# Patient Record
Sex: Male | Born: 1965 | Race: Black or African American | Hispanic: No | Marital: Married | Smoking: Current every day smoker
Health system: Southern US, Community
[De-identification: ages and names within clinical notes are randomized; demographics above are authoritative.]

## PROBLEM LIST (undated history)

## (undated) DIAGNOSIS — N182 Chronic kidney disease, stage 2 (mild): Secondary | ICD-10-CM

## (undated) DIAGNOSIS — K852 Alcohol induced acute pancreatitis without necrosis or infection: Secondary | ICD-10-CM

## (undated) DIAGNOSIS — L02419 Cutaneous abscess of limb, unspecified: Secondary | ICD-10-CM

## (undated) DIAGNOSIS — Z89512 Acquired absence of left leg below knee: Secondary | ICD-10-CM

## (undated) DIAGNOSIS — E11621 Type 2 diabetes mellitus with foot ulcer: Secondary | ICD-10-CM

## (undated) DIAGNOSIS — Z87440 Personal history of urinary (tract) infections: Secondary | ICD-10-CM

## (undated) DIAGNOSIS — E785 Hyperlipidemia, unspecified: Secondary | ICD-10-CM

## (undated) DIAGNOSIS — F32A Depression, unspecified: Secondary | ICD-10-CM

## (undated) DIAGNOSIS — Z87898 Personal history of other specified conditions: Secondary | ICD-10-CM

## (undated) DIAGNOSIS — M869 Osteomyelitis, unspecified: Secondary | ICD-10-CM

## (undated) DIAGNOSIS — L97509 Non-pressure chronic ulcer of other part of unspecified foot with unspecified severity: Secondary | ICD-10-CM

## (undated) DIAGNOSIS — E1129 Type 2 diabetes mellitus with other diabetic kidney complication: Secondary | ICD-10-CM

## (undated) DIAGNOSIS — I739 Peripheral vascular disease, unspecified: Secondary | ICD-10-CM

## (undated) DIAGNOSIS — N151 Renal and perinephric abscess: Secondary | ICD-10-CM

## (undated) DIAGNOSIS — K2210 Ulcer of esophagus without bleeding: Secondary | ICD-10-CM

## (undated) DIAGNOSIS — F329 Major depressive disorder, single episode, unspecified: Secondary | ICD-10-CM

## (undated) DIAGNOSIS — E1165 Type 2 diabetes mellitus with hyperglycemia: Secondary | ICD-10-CM

## (undated) DIAGNOSIS — Z202 Contact with and (suspected) exposure to infections with a predominantly sexual mode of transmission: Secondary | ICD-10-CM

## (undated) DIAGNOSIS — IMO0002 Reserved for concepts with insufficient information to code with codable children: Secondary | ICD-10-CM

## (undated) DIAGNOSIS — F1911 Other psychoactive substance abuse, in remission: Secondary | ICD-10-CM

## (undated) DIAGNOSIS — Z89511 Acquired absence of right leg below knee: Secondary | ICD-10-CM

## (undated) DIAGNOSIS — B351 Tinea unguium: Secondary | ICD-10-CM

## (undated) DIAGNOSIS — E1142 Type 2 diabetes mellitus with diabetic polyneuropathy: Secondary | ICD-10-CM

## (undated) DIAGNOSIS — L03119 Cellulitis of unspecified part of limb: Secondary | ICD-10-CM

## (undated) DIAGNOSIS — L03116 Cellulitis of left lower limb: Secondary | ICD-10-CM

## (undated) DIAGNOSIS — I10 Essential (primary) hypertension: Secondary | ICD-10-CM

## (undated) DIAGNOSIS — Z89519 Acquired absence of unspecified leg below knee: Secondary | ICD-10-CM

## (undated) DIAGNOSIS — K226 Gastro-esophageal laceration-hemorrhage syndrome: Secondary | ICD-10-CM

## (undated) DIAGNOSIS — K219 Gastro-esophageal reflux disease without esophagitis: Secondary | ICD-10-CM

## (undated) DIAGNOSIS — Z87448 Personal history of other diseases of urinary system: Secondary | ICD-10-CM

## (undated) HISTORY — DX: Type 2 diabetes mellitus with diabetic polyneuropathy: E11.42

## (undated) HISTORY — DX: Hyperlipidemia, unspecified: E78.5

## (undated) HISTORY — DX: Personal history of other diseases of urinary system: Z87.448

## (undated) HISTORY — DX: Renal and perinephric abscess: N15.1

## (undated) HISTORY — DX: Cellulitis of left lower limb: L03.116

## (undated) HISTORY — DX: Type 2 diabetes mellitus with foot ulcer: E11.621

## (undated) HISTORY — DX: Contact with and (suspected) exposure to infections with a predominantly sexual mode of transmission: Z20.2

## (undated) HISTORY — DX: Osteomyelitis, unspecified: M86.9

## (undated) HISTORY — DX: Acquired absence of right leg below knee: Z89.511

## (undated) HISTORY — DX: Cellulitis of unspecified part of limb: L03.119

## (undated) HISTORY — DX: Gastro-esophageal laceration-hemorrhage syndrome: K22.6

## (undated) HISTORY — DX: Acquired absence of left leg below knee: Z89.512

## (undated) HISTORY — DX: Personal history of other specified conditions: Z87.898

## (undated) HISTORY — DX: Personal history of urinary (tract) infections: Z87.440

## (undated) HISTORY — DX: Alcohol induced acute pancreatitis without necrosis or infection: K85.20

## (undated) HISTORY — DX: Essential (primary) hypertension: I10

## (undated) HISTORY — DX: Cutaneous abscess of limb, unspecified: L02.419

## (undated) HISTORY — DX: Other psychoactive substance abuse, in remission: F19.11

## (undated) HISTORY — DX: Tinea unguium: B35.1

## (undated) HISTORY — DX: Type 2 diabetes mellitus with foot ulcer: L97.509

## (undated) HISTORY — PX: NEPHRECTOMY: SHX65

## (undated) HISTORY — DX: Ulcer of esophagus without bleeding: K22.10

---

## 1998-01-06 ENCOUNTER — Encounter: Admission: RE | Admit: 1998-01-06 | Discharge: 1998-01-06 | Payer: Self-pay | Admitting: Hematology and Oncology

## 2003-01-04 DIAGNOSIS — E1165 Type 2 diabetes mellitus with hyperglycemia: Secondary | ICD-10-CM

## 2003-01-04 DIAGNOSIS — E118 Type 2 diabetes mellitus with unspecified complications: Secondary | ICD-10-CM

## 2003-02-17 ENCOUNTER — Emergency Department (HOSPITAL_COMMUNITY): Admission: EM | Admit: 2003-02-17 | Discharge: 2003-02-17 | Payer: Self-pay | Admitting: Family Medicine

## 2003-11-10 ENCOUNTER — Encounter: Admission: RE | Admit: 2003-11-10 | Discharge: 2003-11-10 | Payer: Self-pay | Admitting: Internal Medicine

## 2003-11-18 ENCOUNTER — Encounter: Admission: RE | Admit: 2003-11-18 | Discharge: 2003-11-18 | Payer: Self-pay | Admitting: Internal Medicine

## 2003-11-25 ENCOUNTER — Encounter: Admission: RE | Admit: 2003-11-25 | Discharge: 2003-11-25 | Payer: Self-pay | Admitting: Internal Medicine

## 2003-11-25 ENCOUNTER — Ambulatory Visit (HOSPITAL_COMMUNITY): Admission: RE | Admit: 2003-11-25 | Discharge: 2003-11-25 | Payer: Self-pay | Admitting: Internal Medicine

## 2004-02-17 ENCOUNTER — Ambulatory Visit: Payer: Self-pay | Admitting: Internal Medicine

## 2004-03-04 ENCOUNTER — Emergency Department (HOSPITAL_COMMUNITY): Admission: EM | Admit: 2004-03-04 | Discharge: 2004-03-04 | Payer: Self-pay | Admitting: Emergency Medicine

## 2004-03-14 ENCOUNTER — Emergency Department (HOSPITAL_COMMUNITY): Admission: EM | Admit: 2004-03-14 | Discharge: 2004-03-14 | Payer: Self-pay | Admitting: Emergency Medicine

## 2004-03-28 ENCOUNTER — Ambulatory Visit: Payer: Self-pay | Admitting: Internal Medicine

## 2004-04-12 ENCOUNTER — Ambulatory Visit: Payer: Self-pay | Admitting: Internal Medicine

## 2004-05-03 ENCOUNTER — Encounter (HOSPITAL_BASED_OUTPATIENT_CLINIC_OR_DEPARTMENT_OTHER): Admission: RE | Admit: 2004-05-03 | Discharge: 2004-07-05 | Payer: Self-pay | Admitting: Internal Medicine

## 2004-05-21 ENCOUNTER — Emergency Department (HOSPITAL_COMMUNITY): Admission: EM | Admit: 2004-05-21 | Discharge: 2004-05-21 | Payer: Self-pay | Admitting: Emergency Medicine

## 2004-07-20 ENCOUNTER — Ambulatory Visit: Payer: Self-pay | Admitting: Internal Medicine

## 2004-08-03 ENCOUNTER — Ambulatory Visit: Payer: Self-pay | Admitting: Internal Medicine

## 2005-04-10 DIAGNOSIS — K2210 Ulcer of esophagus without bleeding: Secondary | ICD-10-CM

## 2005-04-10 HISTORY — DX: Ulcer of esophagus without bleeding: K22.10

## 2005-10-12 ENCOUNTER — Inpatient Hospital Stay (HOSPITAL_COMMUNITY): Admission: EM | Admit: 2005-10-12 | Discharge: 2005-10-14 | Payer: Self-pay | Admitting: Emergency Medicine

## 2005-10-12 ENCOUNTER — Ambulatory Visit: Payer: Self-pay | Admitting: Internal Medicine

## 2005-10-12 ENCOUNTER — Encounter (INDEPENDENT_AMBULATORY_CARE_PROVIDER_SITE_OTHER): Payer: Self-pay | Admitting: Specialist

## 2005-10-17 ENCOUNTER — Ambulatory Visit: Payer: Self-pay | Admitting: Gastroenterology

## 2005-10-20 ENCOUNTER — Ambulatory Visit: Payer: Self-pay | Admitting: Internal Medicine

## 2005-10-31 ENCOUNTER — Inpatient Hospital Stay (HOSPITAL_COMMUNITY): Admission: AD | Admit: 2005-10-31 | Discharge: 2005-11-04 | Payer: Self-pay | Admitting: Internal Medicine

## 2005-10-31 ENCOUNTER — Ambulatory Visit: Payer: Self-pay | Admitting: Internal Medicine

## 2005-11-15 ENCOUNTER — Ambulatory Visit: Payer: Self-pay | Admitting: Internal Medicine

## 2006-01-03 DIAGNOSIS — F1910 Other psychoactive substance abuse, uncomplicated: Secondary | ICD-10-CM | POA: Insufficient documentation

## 2006-01-03 DIAGNOSIS — F17200 Nicotine dependence, unspecified, uncomplicated: Secondary | ICD-10-CM

## 2006-01-03 DIAGNOSIS — B354 Tinea corporis: Secondary | ICD-10-CM | POA: Insufficient documentation

## 2006-08-17 ENCOUNTER — Emergency Department (HOSPITAL_COMMUNITY): Admission: EM | Admit: 2006-08-17 | Discharge: 2006-08-17 | Payer: Self-pay | Admitting: Emergency Medicine

## 2006-11-22 ENCOUNTER — Emergency Department (HOSPITAL_COMMUNITY): Admission: EM | Admit: 2006-11-22 | Discharge: 2006-11-22 | Payer: Self-pay | Admitting: Emergency Medicine

## 2006-11-23 ENCOUNTER — Other Ambulatory Visit: Payer: Self-pay | Admitting: Emergency Medicine

## 2006-11-24 ENCOUNTER — Inpatient Hospital Stay (HOSPITAL_COMMUNITY): Admission: EM | Admit: 2006-11-24 | Discharge: 2006-11-29 | Payer: Self-pay | Admitting: Internal Medicine

## 2006-11-24 ENCOUNTER — Ambulatory Visit: Payer: Self-pay | Admitting: Internal Medicine

## 2006-11-26 ENCOUNTER — Encounter: Payer: Self-pay | Admitting: Internal Medicine

## 2006-11-29 ENCOUNTER — Encounter: Payer: Self-pay | Admitting: Internal Medicine

## 2006-11-29 ENCOUNTER — Telehealth: Payer: Self-pay | Admitting: *Deleted

## 2006-12-05 ENCOUNTER — Telehealth: Payer: Self-pay | Admitting: *Deleted

## 2006-12-05 ENCOUNTER — Ambulatory Visit: Payer: Self-pay | Admitting: Internal Medicine

## 2006-12-27 ENCOUNTER — Telehealth (INDEPENDENT_AMBULATORY_CARE_PROVIDER_SITE_OTHER): Payer: Self-pay | Admitting: Internal Medicine

## 2007-01-14 ENCOUNTER — Telehealth: Payer: Self-pay | Admitting: *Deleted

## 2007-02-26 ENCOUNTER — Emergency Department (HOSPITAL_COMMUNITY): Admission: EM | Admit: 2007-02-26 | Discharge: 2007-02-26 | Payer: Self-pay | Admitting: *Deleted

## 2007-02-27 ENCOUNTER — Ambulatory Visit: Payer: Self-pay | Admitting: Infectious Disease

## 2007-02-27 ENCOUNTER — Inpatient Hospital Stay (HOSPITAL_COMMUNITY): Admission: EM | Admit: 2007-02-27 | Discharge: 2007-03-04 | Payer: Self-pay | Admitting: Emergency Medicine

## 2007-07-10 ENCOUNTER — Inpatient Hospital Stay (HOSPITAL_COMMUNITY): Admission: EM | Admit: 2007-07-10 | Discharge: 2007-07-12 | Payer: Self-pay | Admitting: Internal Medicine

## 2007-07-10 ENCOUNTER — Ambulatory Visit: Payer: Self-pay | Admitting: *Deleted

## 2007-07-10 DIAGNOSIS — K226 Gastro-esophageal laceration-hemorrhage syndrome: Secondary | ICD-10-CM

## 2007-07-10 HISTORY — DX: Gastro-esophageal laceration-hemorrhage syndrome: K22.6

## 2007-08-23 ENCOUNTER — Telehealth (INDEPENDENT_AMBULATORY_CARE_PROVIDER_SITE_OTHER): Payer: Self-pay | Admitting: *Deleted

## 2007-09-04 ENCOUNTER — Telehealth (INDEPENDENT_AMBULATORY_CARE_PROVIDER_SITE_OTHER): Payer: Self-pay | Admitting: *Deleted

## 2007-09-05 ENCOUNTER — Telehealth (INDEPENDENT_AMBULATORY_CARE_PROVIDER_SITE_OTHER): Payer: Self-pay | Admitting: Pharmacy Technician

## 2007-09-09 ENCOUNTER — Emergency Department (HOSPITAL_COMMUNITY): Admission: EM | Admit: 2007-09-09 | Discharge: 2007-09-09 | Payer: Self-pay | Admitting: Emergency Medicine

## 2007-10-28 ENCOUNTER — Emergency Department (HOSPITAL_COMMUNITY): Admission: EM | Admit: 2007-10-28 | Discharge: 2007-10-29 | Payer: Self-pay | Admitting: Emergency Medicine

## 2007-11-18 ENCOUNTER — Telehealth: Payer: Self-pay | Admitting: *Deleted

## 2007-11-25 ENCOUNTER — Ambulatory Visit: Payer: Self-pay | Admitting: *Deleted

## 2007-11-25 LAB — CONVERTED CEMR LAB: Blood Glucose, Fingerstick: 155

## 2007-11-27 ENCOUNTER — Ambulatory Visit: Payer: Self-pay | Admitting: Internal Medicine

## 2007-11-27 ENCOUNTER — Encounter: Payer: Self-pay | Admitting: Internal Medicine

## 2007-12-09 LAB — CONVERTED CEMR LAB
Albumin: 4 g/dL (ref 3.5–5.2)
Alkaline Phosphatase: 81 units/L (ref 39–117)
BUN: 15 mg/dL (ref 6–23)
Glucose, Bld: 64 mg/dL — ABNORMAL LOW (ref 70–99)
HDL: 88 mg/dL (ref >39)
LDL Cholesterol: 91 mg/dL (ref 0–99)
Microalb, Ur: 2.2 mg/dL — ABNORMAL HIGH (ref 0.00–1.89)
Potassium: 4.1 meq/L (ref 3.5–5.3)
Total Bilirubin: 0.3 mg/dL (ref 0.3–1.2)
Triglycerides: 60 mg/dL (ref <150)

## 2008-01-14 ENCOUNTER — Inpatient Hospital Stay (HOSPITAL_COMMUNITY): Admission: EM | Admit: 2008-01-14 | Discharge: 2008-01-16 | Payer: Self-pay | Admitting: Emergency Medicine

## 2008-01-14 ENCOUNTER — Ambulatory Visit: Payer: Self-pay | Admitting: *Deleted

## 2008-02-04 ENCOUNTER — Encounter (INDEPENDENT_AMBULATORY_CARE_PROVIDER_SITE_OTHER): Payer: Self-pay | Admitting: Internal Medicine

## 2008-03-10 DIAGNOSIS — L03116 Cellulitis of left lower limb: Secondary | ICD-10-CM

## 2008-03-10 HISTORY — DX: Cellulitis of left lower limb: L03.116

## 2008-03-19 ENCOUNTER — Telehealth: Payer: Self-pay | Admitting: *Deleted

## 2008-03-24 ENCOUNTER — Ambulatory Visit: Payer: Self-pay | Admitting: Internal Medicine

## 2008-03-24 ENCOUNTER — Inpatient Hospital Stay (HOSPITAL_COMMUNITY): Admission: AD | Admit: 2008-03-24 | Discharge: 2008-03-30 | Payer: Self-pay | Admitting: Internal Medicine

## 2008-03-24 DIAGNOSIS — E1169 Type 2 diabetes mellitus with other specified complication: Secondary | ICD-10-CM

## 2008-03-26 ENCOUNTER — Ambulatory Visit: Payer: Self-pay | Admitting: Infectious Diseases

## 2008-03-30 ENCOUNTER — Encounter: Payer: Self-pay | Admitting: Internal Medicine

## 2008-04-23 ENCOUNTER — Telehealth: Payer: Self-pay | Admitting: Internal Medicine

## 2008-04-23 ENCOUNTER — Telehealth (INDEPENDENT_AMBULATORY_CARE_PROVIDER_SITE_OTHER): Payer: Self-pay | Admitting: *Deleted

## 2008-05-05 ENCOUNTER — Encounter: Payer: Self-pay | Admitting: Internal Medicine

## 2008-06-10 ENCOUNTER — Telehealth: Payer: Self-pay | Admitting: *Deleted

## 2008-06-16 ENCOUNTER — Telehealth: Payer: Self-pay | Admitting: Internal Medicine

## 2008-07-21 ENCOUNTER — Ambulatory Visit: Payer: Self-pay | Admitting: Internal Medicine

## 2008-07-21 ENCOUNTER — Encounter: Payer: Self-pay | Admitting: Internal Medicine

## 2008-07-21 DIAGNOSIS — E1140 Type 2 diabetes mellitus with diabetic neuropathy, unspecified: Secondary | ICD-10-CM

## 2008-07-21 DIAGNOSIS — N182 Chronic kidney disease, stage 2 (mild): Secondary | ICD-10-CM

## 2008-07-21 DIAGNOSIS — R82998 Other abnormal findings in urine: Secondary | ICD-10-CM

## 2008-07-21 LAB — CONVERTED CEMR LAB
Blood Glucose, Fingerstick: 195
Blood in Urine, dipstick: NEGATIVE
CO2: 27 meq/L (ref 19–32)
Chloride: 104 meq/L (ref 96–112)
Glucose, Bld: 196 mg/dL — ABNORMAL HIGH (ref 70–99)
Glucose, Urine, Semiquant: 500
Ketones, urine, test strip: NEGATIVE
Nitrite: NEGATIVE
Potassium: 4.1 meq/L (ref 3.5–5.3)
Sodium: 137 meq/L (ref 135–145)

## 2008-08-07 ENCOUNTER — Ambulatory Visit: Payer: Self-pay | Admitting: *Deleted

## 2008-08-07 ENCOUNTER — Inpatient Hospital Stay (HOSPITAL_COMMUNITY): Admission: EM | Admit: 2008-08-07 | Discharge: 2008-08-09 | Payer: Self-pay | Admitting: Emergency Medicine

## 2008-08-08 DIAGNOSIS — K852 Alcohol induced acute pancreatitis without necrosis or infection: Secondary | ICD-10-CM

## 2008-08-08 HISTORY — DX: Alcohol induced acute pancreatitis without necrosis or infection: K85.20

## 2008-08-09 ENCOUNTER — Encounter (INDEPENDENT_AMBULATORY_CARE_PROVIDER_SITE_OTHER): Payer: Self-pay | Admitting: Internal Medicine

## 2008-09-01 ENCOUNTER — Telehealth: Payer: Self-pay | Admitting: Internal Medicine

## 2008-09-08 DIAGNOSIS — N151 Renal and perinephric abscess: Secondary | ICD-10-CM

## 2008-09-08 HISTORY — DX: Renal and perinephric abscess: N15.1

## 2008-09-10 ENCOUNTER — Ambulatory Visit: Payer: Self-pay | Admitting: Infectious Diseases

## 2008-09-10 ENCOUNTER — Inpatient Hospital Stay (HOSPITAL_COMMUNITY): Admission: EM | Admit: 2008-09-10 | Discharge: 2008-09-16 | Payer: Self-pay | Admitting: Emergency Medicine

## 2008-09-16 ENCOUNTER — Encounter (INDEPENDENT_AMBULATORY_CARE_PROVIDER_SITE_OTHER): Payer: Self-pay | Admitting: *Deleted

## 2008-09-16 DIAGNOSIS — N151 Renal and perinephric abscess: Secondary | ICD-10-CM | POA: Insufficient documentation

## 2008-09-22 ENCOUNTER — Ambulatory Visit: Payer: Self-pay | Admitting: Internal Medicine

## 2008-09-22 ENCOUNTER — Encounter (INDEPENDENT_AMBULATORY_CARE_PROVIDER_SITE_OTHER): Payer: Self-pay | Admitting: *Deleted

## 2008-09-22 DIAGNOSIS — I10 Essential (primary) hypertension: Secondary | ICD-10-CM | POA: Insufficient documentation

## 2008-09-22 DIAGNOSIS — R000 Tachycardia, unspecified: Secondary | ICD-10-CM

## 2008-09-22 DIAGNOSIS — B351 Tinea unguium: Secondary | ICD-10-CM | POA: Insufficient documentation

## 2008-09-22 LAB — CONVERTED CEMR LAB
Amphetamine Screen, Ur: NEGATIVE
Benzodiazepines.: NEGATIVE
Cocaine Metabolites: NEGATIVE
Creatinine,U: 182.8 mg/dL
Leukocytes, UA: NEGATIVE
Nitrite: NEGATIVE
Specific Gravity, Urine: 1.027 (ref 1.005–1.030)
Urine Glucose: 500 mg/dL — AB
pH: 6 (ref 5.0–8.0)

## 2008-09-23 ENCOUNTER — Encounter (INDEPENDENT_AMBULATORY_CARE_PROVIDER_SITE_OTHER): Payer: Self-pay | Admitting: *Deleted

## 2008-09-24 ENCOUNTER — Telehealth: Payer: Self-pay | Admitting: Internal Medicine

## 2008-09-28 ENCOUNTER — Encounter: Payer: Self-pay | Admitting: Internal Medicine

## 2008-10-13 ENCOUNTER — Encounter: Payer: Self-pay | Admitting: Internal Medicine

## 2008-10-16 ENCOUNTER — Telehealth: Payer: Self-pay | Admitting: Infectious Diseases

## 2008-10-22 ENCOUNTER — Encounter (INDEPENDENT_AMBULATORY_CARE_PROVIDER_SITE_OTHER): Payer: Self-pay | Admitting: Urology

## 2008-10-22 ENCOUNTER — Inpatient Hospital Stay (HOSPITAL_COMMUNITY): Admission: RE | Admit: 2008-10-22 | Discharge: 2008-10-26 | Payer: Self-pay | Admitting: Urology

## 2008-10-28 ENCOUNTER — Encounter: Payer: Self-pay | Admitting: Internal Medicine

## 2008-10-28 ENCOUNTER — Ambulatory Visit: Payer: Self-pay | Admitting: Internal Medicine

## 2008-10-28 LAB — CONVERTED CEMR LAB
ALT: 10 units/L (ref 0–53)
AST: 11 units/L (ref 0–37)
Albumin: 3.9 g/dL (ref 3.5–5.2)
Alkaline Phosphatase: 100 units/L (ref 39–117)
Blood Glucose, Fingerstick: 189
Calcium: 9.3 mg/dL (ref 8.4–10.5)
Chloride: 101 meq/L (ref 96–112)
Creatinine, Ser: 1.35 mg/dL (ref 0.40–1.50)
Hgb A1c MFr Bld: 8.8 %
LDL Cholesterol: 94 mg/dL (ref 0–99)
Potassium: 4.3 meq/L (ref 3.5–5.3)
Total CHOL/HDL Ratio: 2.7
Triglycerides: 74 mg/dL (ref <150)
VLDL: 15 mg/dL (ref 0–40)

## 2008-12-02 ENCOUNTER — Telehealth (INDEPENDENT_AMBULATORY_CARE_PROVIDER_SITE_OTHER): Payer: Self-pay | Admitting: Internal Medicine

## 2009-02-05 ENCOUNTER — Telehealth: Payer: Self-pay | Admitting: Internal Medicine

## 2009-10-05 IMAGING — US US ABDOMEN COMPLETE
1 series · 14 of 25 positions shown · non-contrast
Comparison: CT scan dated 10/29/2007

CLINICAL DATA: Abdominal pain with nausea and vomiting.

COMPLETE ABDOMINAL ULTRASOUND

[Series 1: us abdomen complete · 0.32mm/px · 14 of 76 slices shown]
[im 1/76]
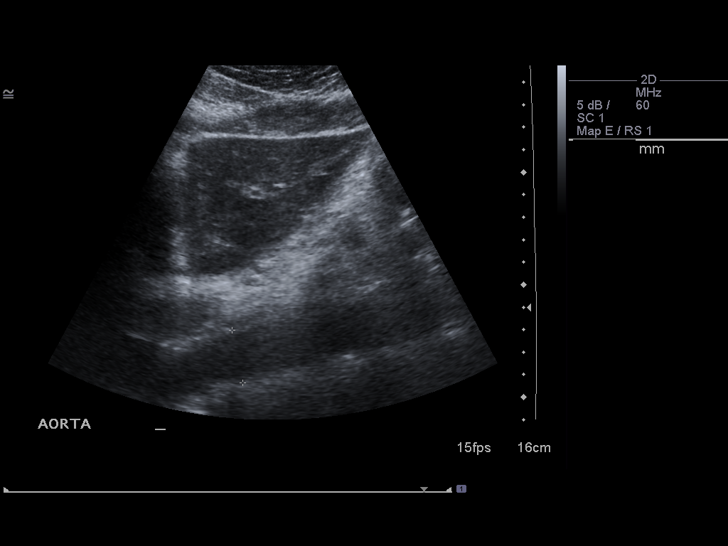
[im 7/76]
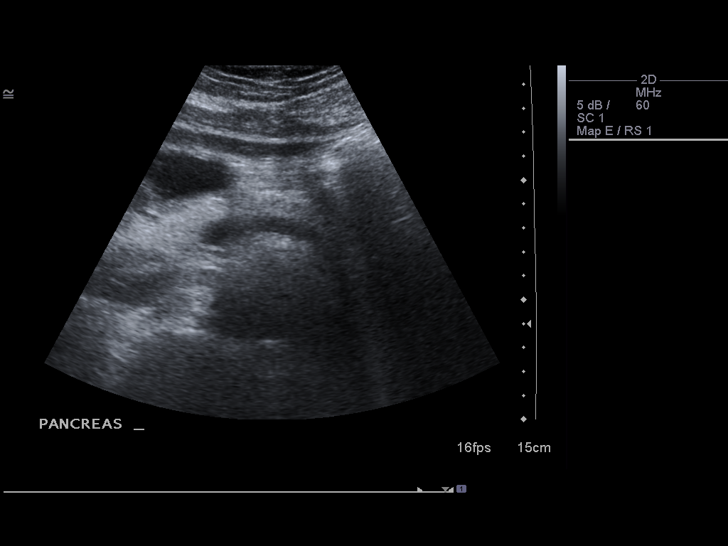
[im 13/76]
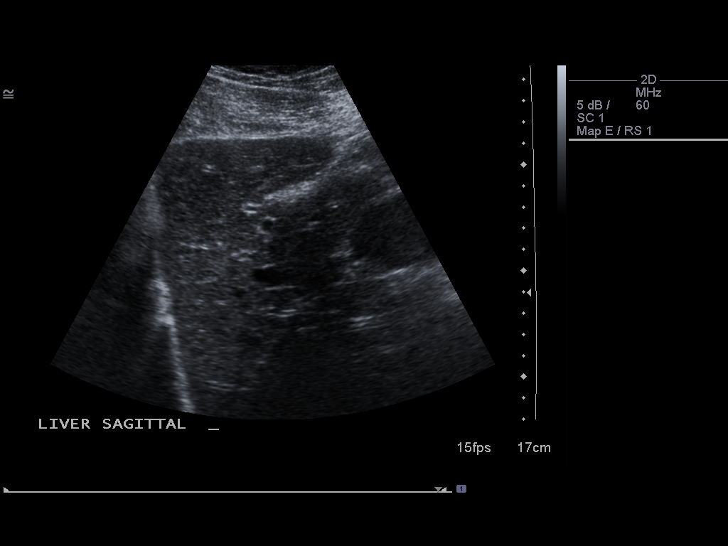
[im 19/76]
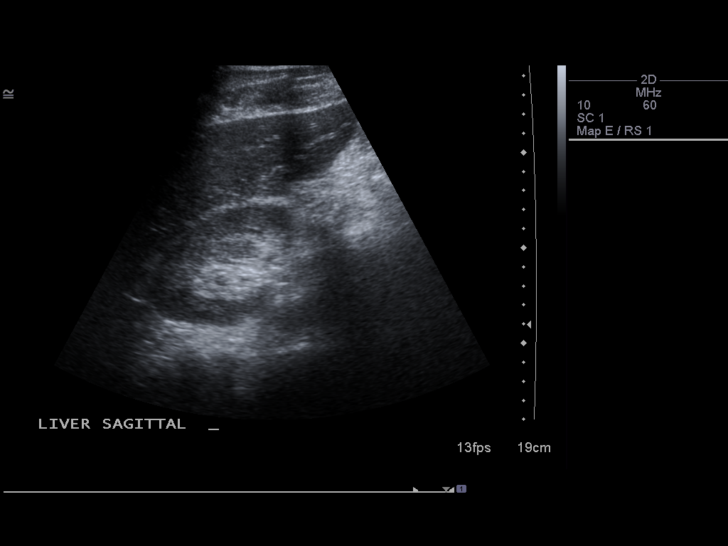
[im 26/76]
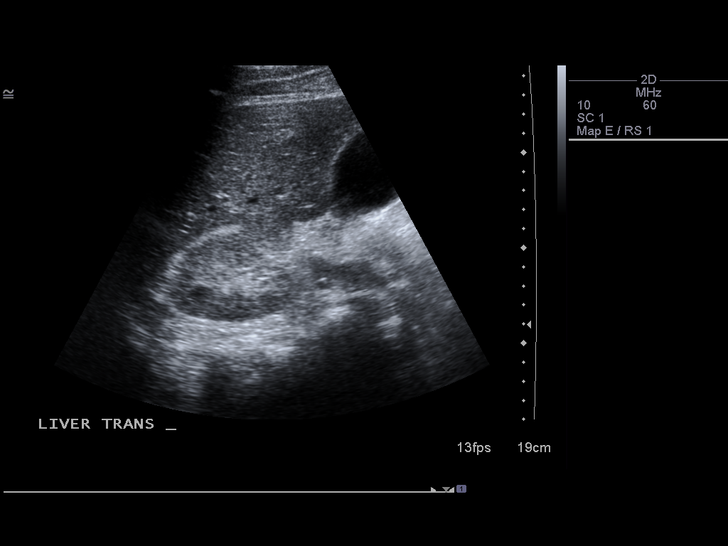
[im 29/76]
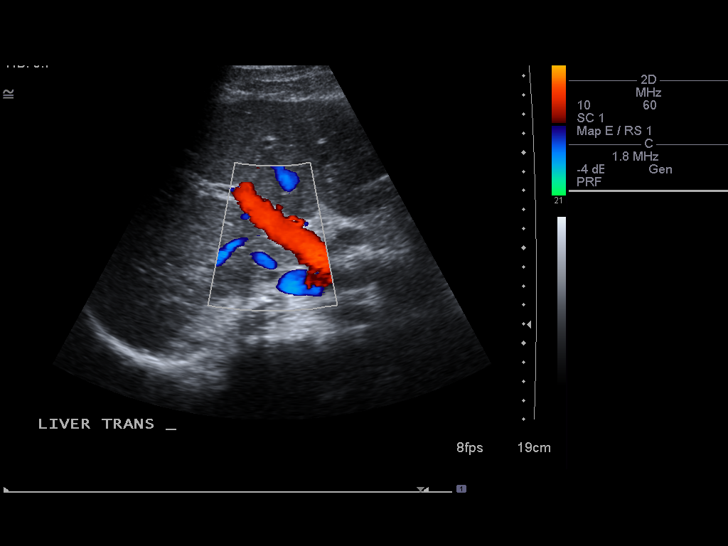
[im 35/76]
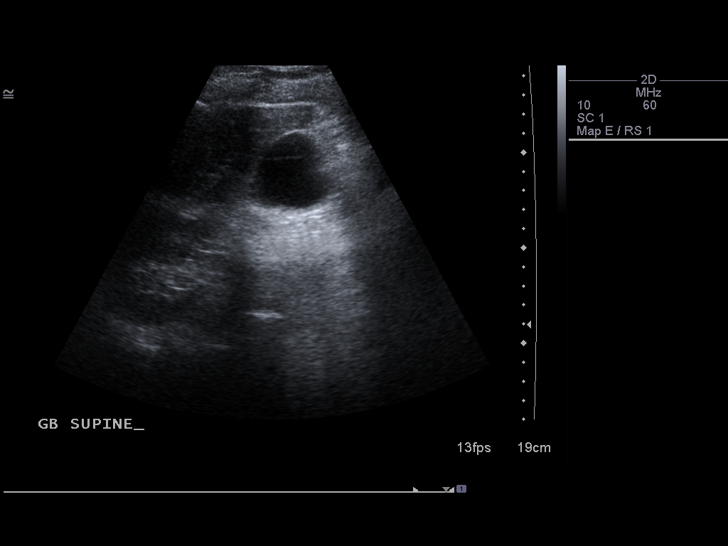
[im 41/76]
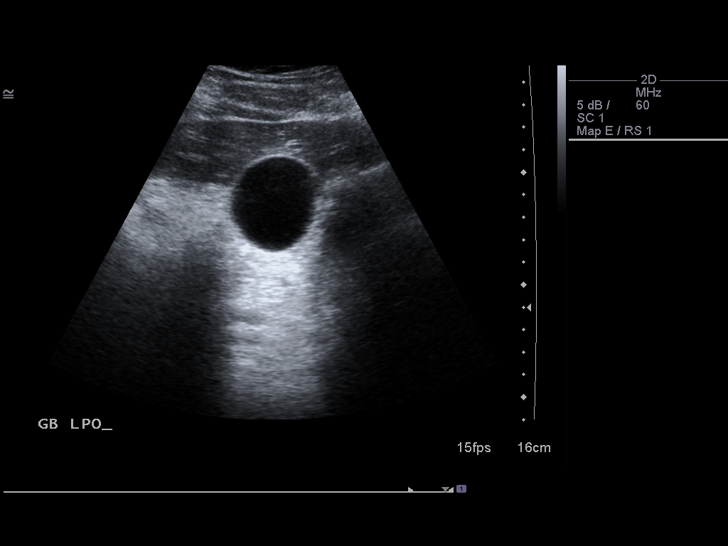
[im 47/76]
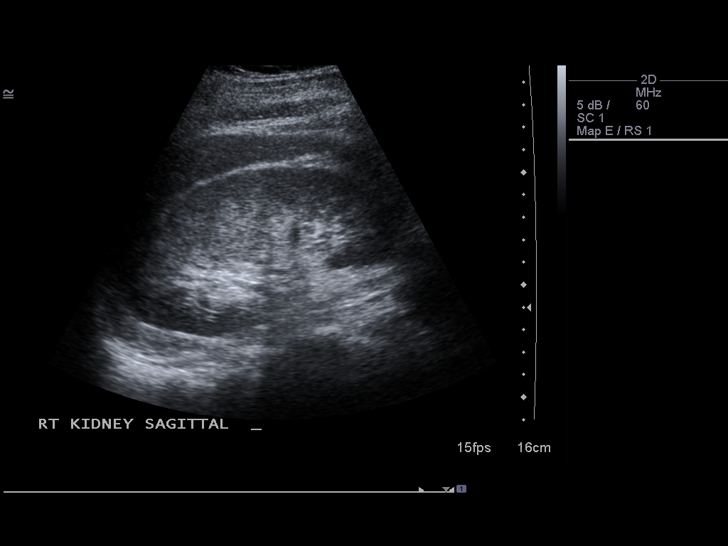
[im 51/76]
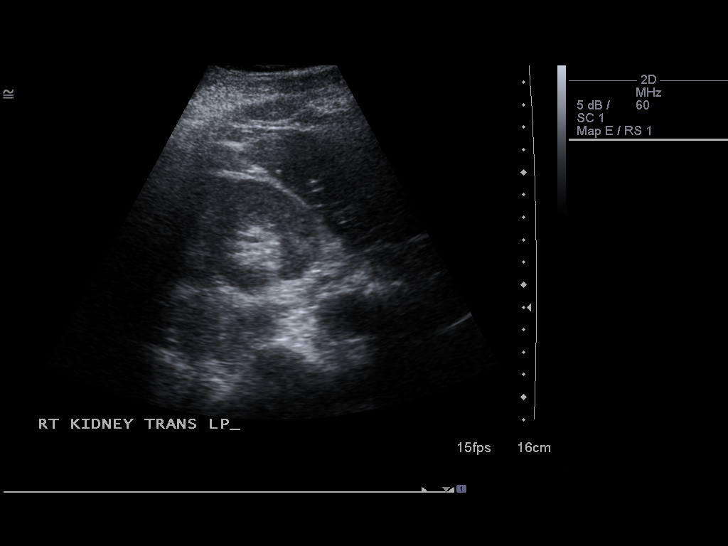
[im 57/76]
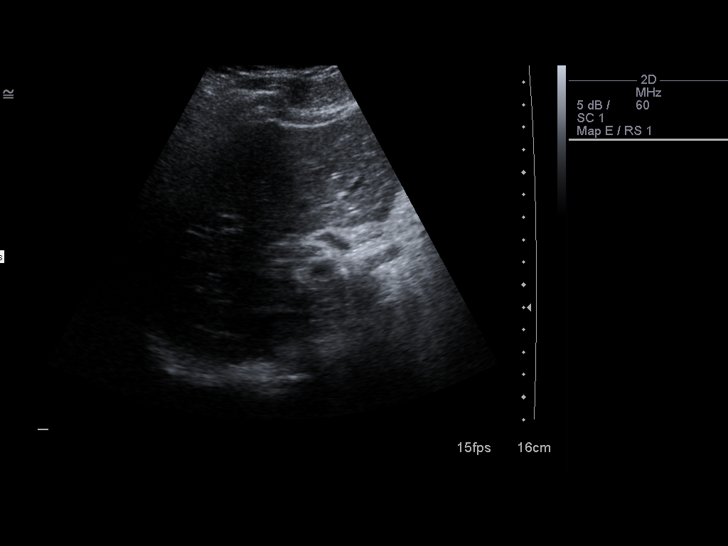
[im 63/76]
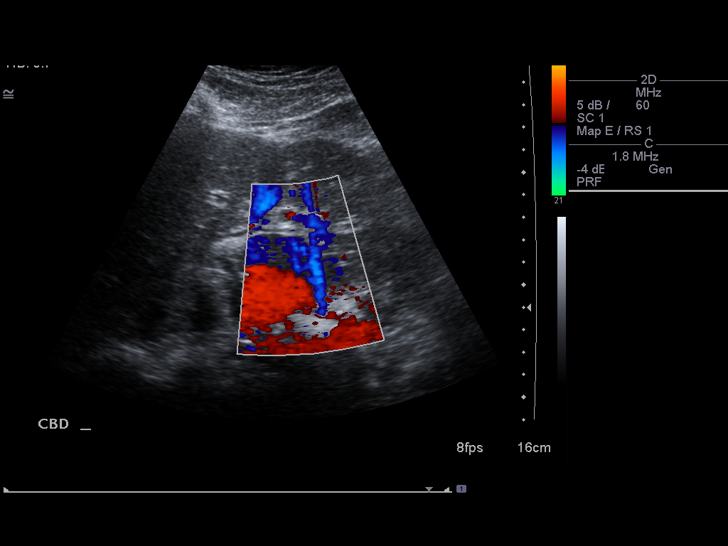
[im 69/76]
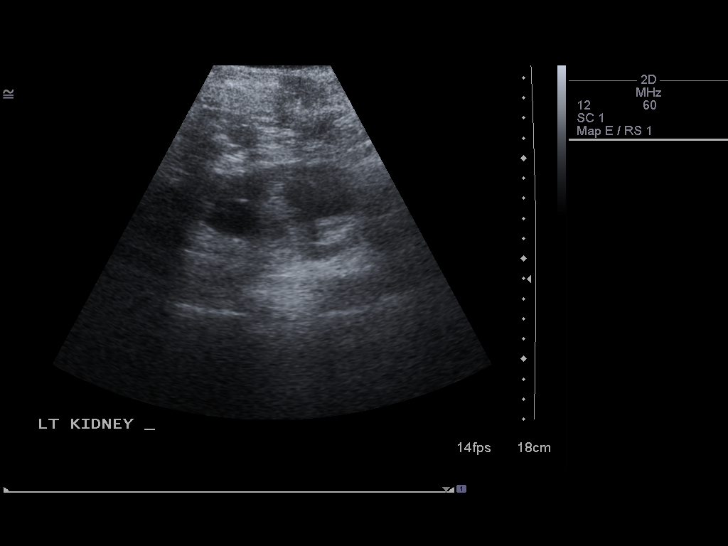
[im 76/76]
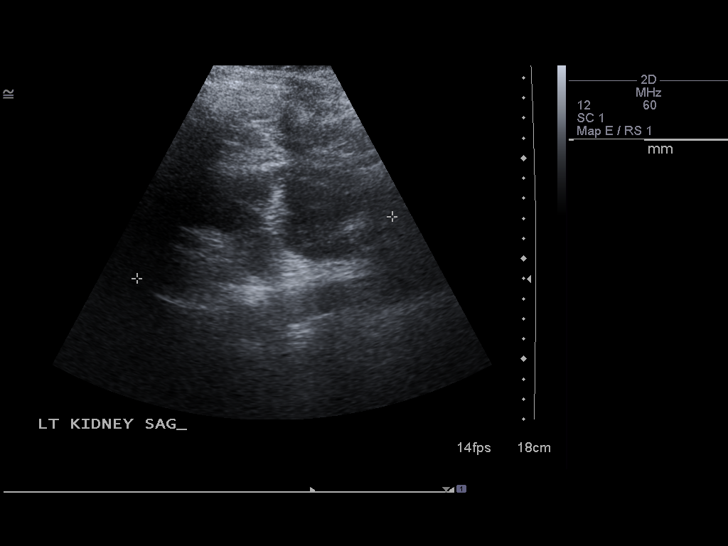

[14 of 25 positions shown; findings below may reference images not displayed]

FINDINGS: Gallbladder:  Normal.  However, the patient did have a positive
sonographic Murphy's sign.

Common bile duct:  Normal at 5.8 mm.

Liver:  There is intrahepatic ductal dilatation without a discrete
site of obstruction.  There is no discrete mass.

IVC:  Normal.

Pancreas:  Normal.

Spleen:  Normal.  6.5 cm in length.

Right Kidney:  Normal.  13.1 cm in length.

Left Kidney:  There is chronic  severe hydronephrosis, unchanged
since 10/29/2007

Abdominal aorta:  Normal.  2.4 cm maximal diameter.

Other Findings:  None
IMPRESSION: The patient has mild nonspecific intrahepatic biliary ductal
dilatation of unknown etiology.

Chronic left hydronephrosis, unchanged.

## 2009-12-22 IMAGING — CR DG CHEST 2V
2 series · 2 of 2 positions shown · non-contrast
Comparison: CT abdomen 09/15/2008.  11/24/2006 and earlier.

CLINICAL DATA: 43-year-old male with pyelonephritis and shortness
of breath.

CHEST - 2 VIEW

[w chest pa]
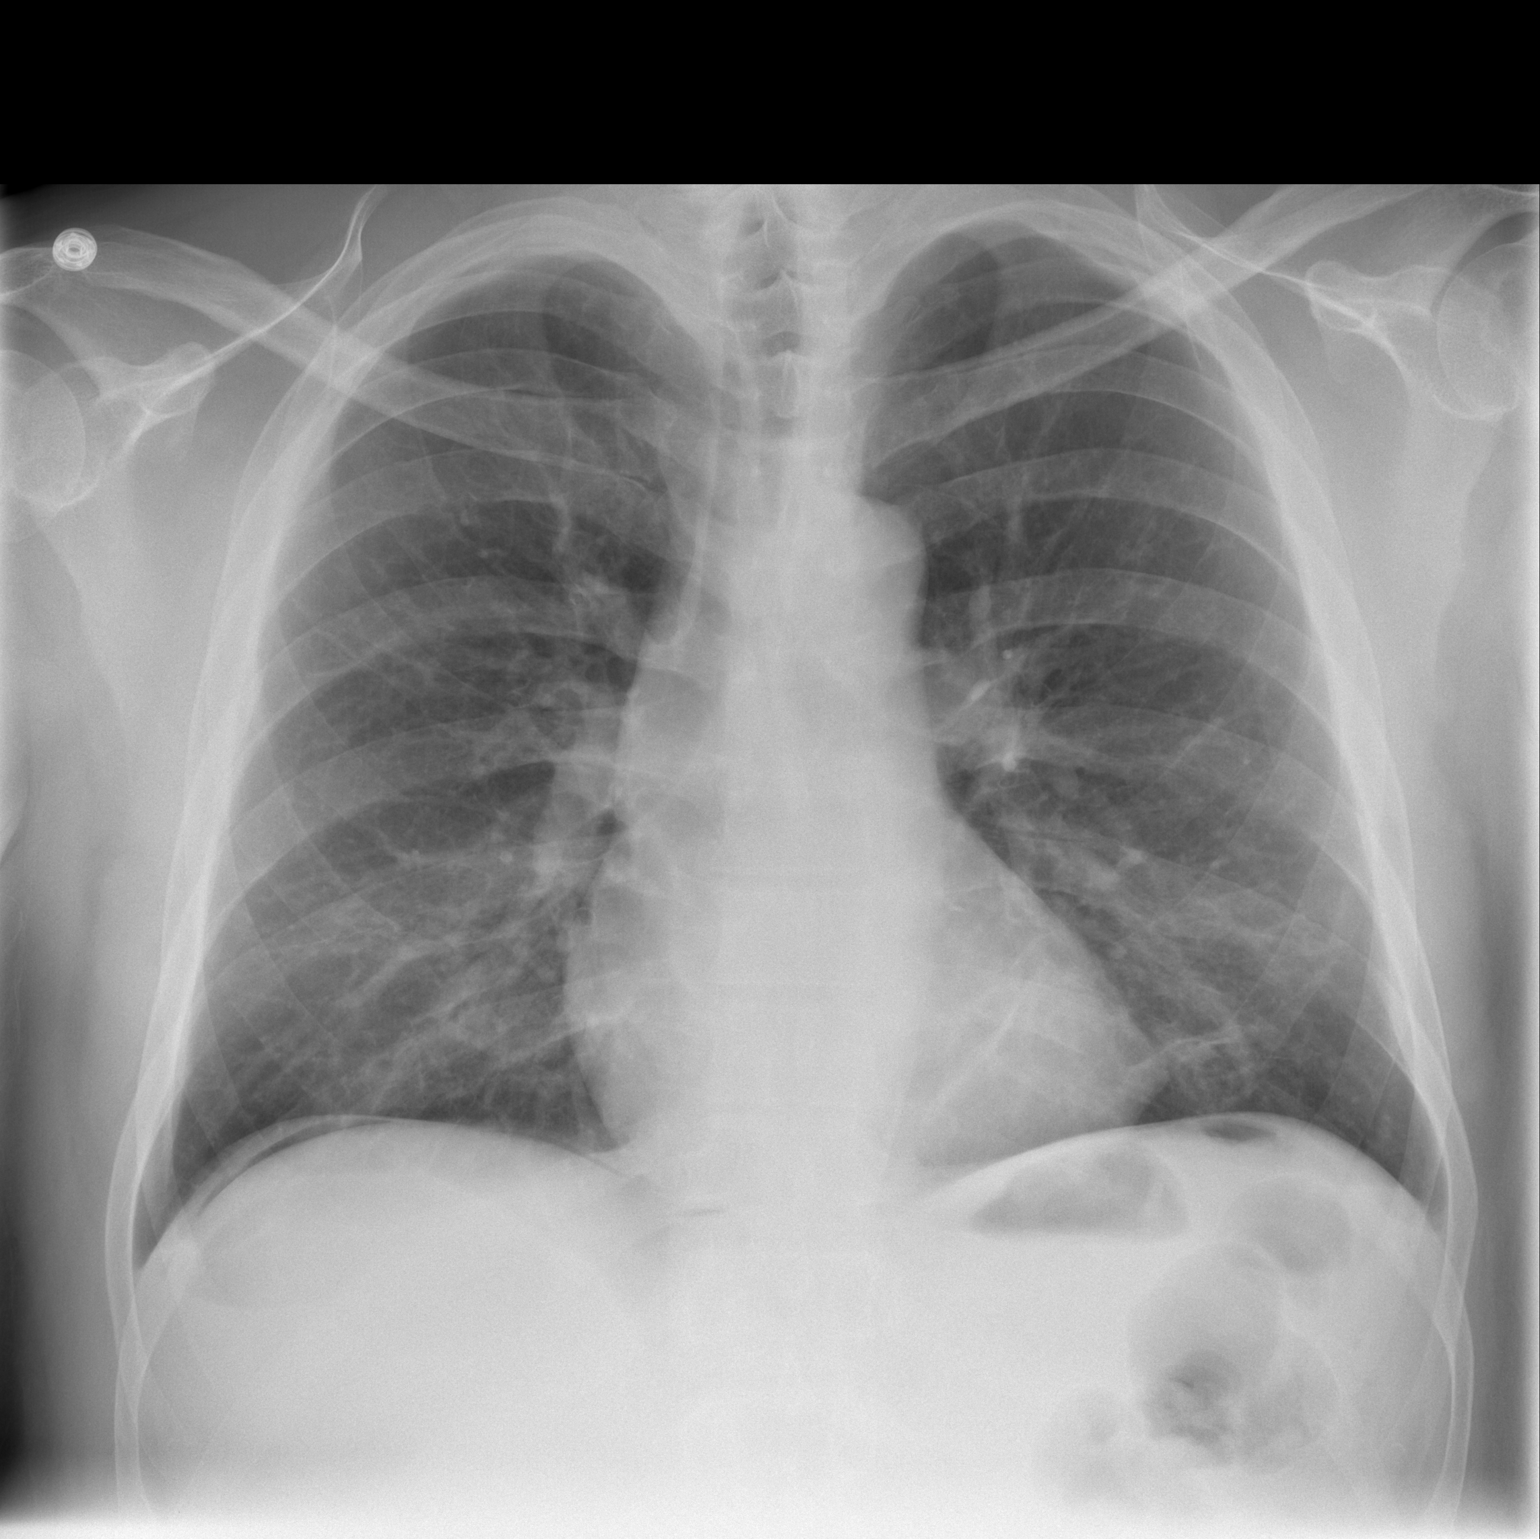

[w chest lat]
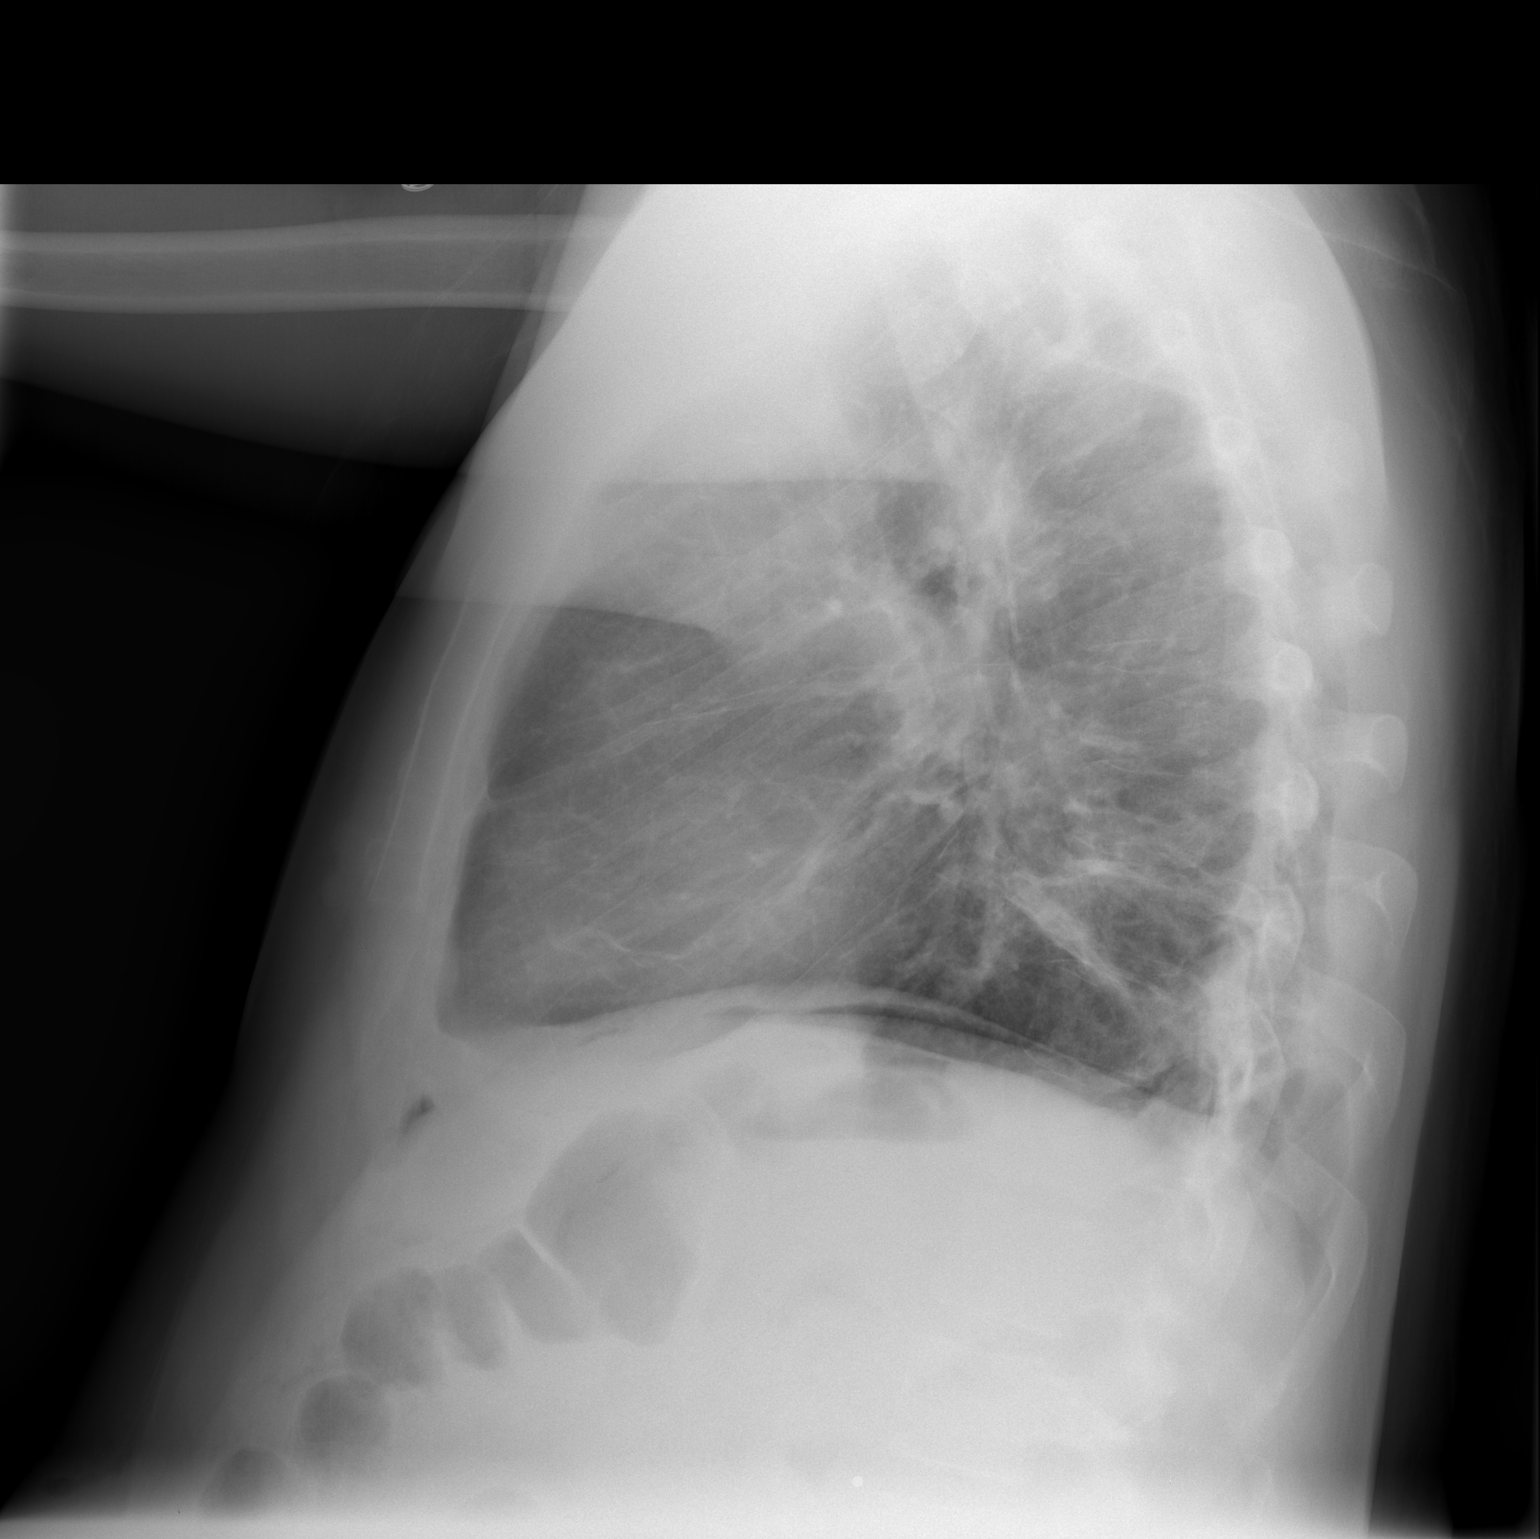

[2 of 2 positions shown; findings below may reference images not displayed]

FINDINGS: Pneumoperitoneum, not evident on the comparison CT of the
abdomen from last month.  No pneumothorax.  No pneumomediastinum
identified.  Lungs remain clear.  Stable cardiac size and
mediastinal contours.  Stable visualized osseous structures.  There
is a small punctate metallic or radiopaque foreign body projecting
in the upper abdomen on the lateral view of uncertain significance.
IMPRESSION: 1.  New pneumoperitoneum.  By report, this patient had a recent
laparoscopic renal procedure , but that would presumably be limited
to the retroperitoneal space.  Correlate for possible ruptured
bowel.
2. No acute cardiopulmonary abnormality.

Critical test results telephoned to Michael Ivan Beatingo RN at the time

## 2010-05-19 ENCOUNTER — Other Ambulatory Visit: Payer: Self-pay | Admitting: Internal Medicine

## 2010-05-19 ENCOUNTER — Inpatient Hospital Stay (HOSPITAL_COMMUNITY)
Admission: AD | Admit: 2010-05-19 | Discharge: 2010-05-23 | DRG: 617 | Disposition: A | Discharge status: Home / Self Care | Payer: Medicaid Other | Source: Ambulatory Visit | Attending: Internal Medicine | Admitting: Internal Medicine

## 2010-05-19 ENCOUNTER — Encounter: Payer: Self-pay | Admitting: Internal Medicine

## 2010-05-19 ENCOUNTER — Inpatient Hospital Stay (HOSPITAL_COMMUNITY): Payer: Medicaid Other

## 2010-05-19 ENCOUNTER — Ambulatory Visit (INDEPENDENT_AMBULATORY_CARE_PROVIDER_SITE_OTHER): Payer: Self-pay | Admitting: Internal Medicine

## 2010-05-19 DIAGNOSIS — I10 Essential (primary) hypertension: Secondary | ICD-10-CM | POA: Diagnosis present

## 2010-05-19 DIAGNOSIS — E119 Type 2 diabetes mellitus without complications: Secondary | ICD-10-CM

## 2010-05-19 DIAGNOSIS — L97509 Non-pressure chronic ulcer of other part of unspecified foot with unspecified severity: Secondary | ICD-10-CM

## 2010-05-19 DIAGNOSIS — I2789 Other specified pulmonary heart diseases: Secondary | ICD-10-CM | POA: Diagnosis present

## 2010-05-19 DIAGNOSIS — E1169 Type 2 diabetes mellitus with other specified complication: Secondary | ICD-10-CM

## 2010-05-19 DIAGNOSIS — M9080 Osteopathy in diseases classified elsewhere, unspecified site: Secondary | ICD-10-CM | POA: Diagnosis present

## 2010-05-19 DIAGNOSIS — M869 Osteomyelitis, unspecified: Secondary | ICD-10-CM | POA: Diagnosis present

## 2010-05-19 DIAGNOSIS — E785 Hyperlipidemia, unspecified: Secondary | ICD-10-CM | POA: Diagnosis present

## 2010-05-19 DIAGNOSIS — Z794 Long term (current) use of insulin: Secondary | ICD-10-CM

## 2010-05-19 DIAGNOSIS — D638 Anemia in other chronic diseases classified elsewhere: Secondary | ICD-10-CM | POA: Diagnosis present

## 2010-05-19 DIAGNOSIS — E11621 Type 2 diabetes mellitus with foot ulcer: Secondary | ICD-10-CM

## 2010-05-19 DIAGNOSIS — K219 Gastro-esophageal reflux disease without esophagitis: Secondary | ICD-10-CM | POA: Diagnosis present

## 2010-05-19 LAB — COMPREHENSIVE METABOLIC PANEL
ALT: 11 U/L (ref 0–53)
AST: 17 U/L (ref 0–37)
CO2: 25 mEq/L (ref 19–32)
Chloride: 102 mEq/L (ref 96–112)
Creatinine, Ser: 1.52 mg/dL — ABNORMAL HIGH (ref 0.4–1.5)
GFR calc Af Amer: >60 mL/min (ref >60)
GFR calc non Af Amer: 50 mL/min — ABNORMAL LOW (ref >60)
Glucose, Bld: 227 mg/dL — ABNORMAL HIGH (ref 70–99)
Total Bilirubin: 0.3 mg/dL (ref 0.3–1.2)

## 2010-05-19 LAB — RAPID URINE DRUG SCREEN, HOSP PERFORMED
Benzodiazepines: NOT DETECTED
Cocaine: NOT DETECTED
Tetrahydrocannabinol: POSITIVE — AB

## 2010-05-19 LAB — GLUCOSE, CAPILLARY
Glucose-Capillary: 131 mg/dL — ABNORMAL HIGH (ref 70–99)
Glucose-Capillary: 160 mg/dL — ABNORMAL HIGH (ref 70–99)

## 2010-05-19 NOTE — Progress Notes (Signed)
  Subjective:    Patient ID: [name], male    DOB: 03-Jan-1966, 45 y.o.   MRN: [id number]  Diabetes     45 year old man with past medical history of diabetes a hemoglobin A1c of 12.3 , diabetic peripheral neuropathy , history of diabetic foot ulcer comes to the clinic complaining of right toe purulent discharge which started one month ago.   Review of Systems  All other systems reviewed and are negative.       Objective:   Physical Exam  Skin:       Right toe ulcer 2 X 2 in. with purulent discharge, erythema, swelling.           Assessment & Plan:

## 2010-05-19 NOTE — Progress Notes (Signed)
Report called to Nurse on 5500.  Pt transported via wheelchair to 5504.

## 2010-05-20 ENCOUNTER — Inpatient Hospital Stay (HOSPITAL_COMMUNITY): Payer: Medicaid Other

## 2010-05-20 LAB — MAGNESIUM: Magnesium: 2 mg/dL (ref 1.5–2.5)

## 2010-05-20 LAB — CBC
HCT: 32.3 % — ABNORMAL LOW (ref 39.0–52.0)
Platelets: 387 10*3/uL (ref 150–400)
RBC: 3.59 MIL/uL — ABNORMAL LOW (ref 4.22–5.81)
RDW: 12.9 % (ref 11.5–15.5)
WBC: 7.3 10*3/uL (ref 4.0–10.5)

## 2010-05-20 LAB — GLUCOSE, CAPILLARY
Glucose-Capillary: 248 mg/dL — ABNORMAL HIGH (ref 70–99)
Glucose-Capillary: 238 mg/dL — ABNORMAL HIGH (ref 70–99)

## 2010-05-20 LAB — LIPID PANEL: VLDL: 21 mg/dL (ref 0–40)

## 2010-05-20 LAB — BASIC METABOLIC PANEL
BUN: 14 mg/dL (ref 6–23)
Calcium: 8.4 mg/dL (ref 8.4–10.5)
Chloride: 106 mEq/L (ref 96–112)
Creatinine, Ser: 1.45 mg/dL (ref 0.4–1.5)
Potassium: 4.3 mEq/L (ref 3.5–5.1)
Sodium: 136 mEq/L (ref 135–145)

## 2010-05-20 LAB — PHOSPHORUS: Phosphorus: 3.5 mg/dL (ref 2.3–4.6)

## 2010-05-21 ENCOUNTER — Other Ambulatory Visit: Payer: Self-pay | Admitting: Orthopedic Surgery

## 2010-05-21 LAB — CBC
Hemoglobin: 10.5 g/dL — ABNORMAL LOW (ref 13.0–17.0)
Platelets: 355 10*3/uL (ref 150–400)
RBC: 3.57 MIL/uL — ABNORMAL LOW (ref 4.22–5.81)

## 2010-05-21 LAB — GLUCOSE, CAPILLARY
Glucose-Capillary: 251 mg/dL — ABNORMAL HIGH (ref 70–99)
Glucose-Capillary: 236 mg/dL — ABNORMAL HIGH (ref 70–99)
Glucose-Capillary: 263 mg/dL — ABNORMAL HIGH (ref 70–99)
Glucose-Capillary: 180 mg/dL — ABNORMAL HIGH (ref 70–99)

## 2010-05-21 LAB — BASIC METABOLIC PANEL
CO2: 24 mEq/L (ref 19–32)
Calcium: 8.5 mg/dL (ref 8.4–10.5)
Chloride: 107 mEq/L (ref 96–112)
GFR calc Af Amer: >60 mL/min (ref >60)
Sodium: 137 mEq/L (ref 135–145)

## 2010-05-22 LAB — BASIC METABOLIC PANEL
BUN: 17 mg/dL (ref 6–23)
CO2: 27 mEq/L (ref 19–32)
Calcium: 8.8 mg/dL (ref 8.4–10.5)
Creatinine, Ser: 1.59 mg/dL — ABNORMAL HIGH (ref 0.4–1.5)
GFR calc non Af Amer: 48 mL/min — ABNORMAL LOW (ref >60)
Glucose, Bld: 246 mg/dL — ABNORMAL HIGH (ref 70–99)
Sodium: 135 mEq/L (ref 135–145)

## 2010-05-22 LAB — CBC
HCT: 34.5 % — ABNORMAL LOW (ref 39.0–52.0)
Hemoglobin: 11.2 g/dL — ABNORMAL LOW (ref 13.0–17.0)
MCH: 29.1 pg (ref 26.0–34.0)
MCHC: 32.5 g/dL (ref 30.0–36.0)
RDW: 12.7 % (ref 11.5–15.5)

## 2010-05-22 LAB — GLUCOSE, CAPILLARY
Glucose-Capillary: 170 mg/dL — ABNORMAL HIGH (ref 70–99)
Glucose-Capillary: 304 mg/dL — ABNORMAL HIGH (ref 70–99)
Glucose-Capillary: 456 mg/dL — ABNORMAL HIGH (ref 70–99)
Glucose-Capillary: 491 mg/dL — ABNORMAL HIGH (ref 70–99)

## 2010-05-23 LAB — CBC
HCT: 35 % — ABNORMAL LOW (ref 39.0–52.0)
Hemoglobin: 11.5 g/dL — ABNORMAL LOW (ref 13.0–17.0)
MCHC: 32.9 g/dL (ref 30.0–36.0)
MCV: 89.5 fL (ref 78.0–100.0)

## 2010-05-23 LAB — GLUCOSE, CAPILLARY: Glucose-Capillary: 266 mg/dL — ABNORMAL HIGH (ref 70–99)

## 2010-05-23 LAB — BASIC METABOLIC PANEL
BUN: 14 mg/dL (ref 6–23)
CO2: 25 mEq/L (ref 19–32)
Calcium: 9 mg/dL (ref 8.4–10.5)
Glucose, Bld: 204 mg/dL — ABNORMAL HIGH (ref 70–99)
Sodium: 137 mEq/L (ref 135–145)

## 2010-05-26 LAB — CULTURE, BLOOD (ROUTINE X 2)
Culture  Setup Time: 201202100217
Culture: NO GROWTH
Culture: NO GROWTH

## 2010-05-26 LAB — CULTURE, BLOOD (SINGLE)
Culture  Setup Time: 201202100043
Culture: NO GROWTH

## 2010-05-26 NOTE — Initial Assessments (Signed)
INTERNAL MEDICINE ADMISSION HISTORY AND PHYSICAL  PCP: Dr. Laren Everts  1st contact Dr Anselm Jungling [phone] 2nd contact Dr Aldine Contes  [phone]  Holidays or 5pm on weekdays:  1st contact [phone] 2nd contact [phone] CC: foot ulcer  HPI: 45 yo man with PMH of DM x 9 years, HTN presents to clinic with 1 month history of right foot ulcer.  He endorses yellow purulent discharge from his right ulcer without any tenderness.  He has numbness on both of his feet and that he would shake it off to relieve his numbness.  He does have pain that shoot up from both of his legs and that his CBG usually ranges from 400-500s with a HbA1c 12.6 on 05/19/10.  Denies any SOB, chestpain, fever, N/V, urinary or bowel symptoms.  Does have blurry vision; however, he states that it is because of his high sugar and that he wears glasses. Last seen by opthalmologist 1 year ago and last seen podiatrist in 2010.   ALLERGIES: Allergies:  NKDA   PAST MEDICAL HISTORY: Past Medical History: Diabetes mellitus, type 1,    H/o Left foot cellulitis - left 4th and 5th metatarsal area, 03/2008   Proteinuria Hypertension Hyperlipidemia  Polysubstance abuse (cocaine, marijuana, alcohol).   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 History of Ulcerative esophagitis-severe, complicated with UGI bleed, 2007. History of Mallory-Weiss tear in April 2009.  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 * GLUCOSE METER Meter for glucose testing based on formulary and preference. * TESTING SUPPLIES- LANCETS, NEEDLES, SYRINGES For use  w/selested meter NEURONTIN 100 MG CAPS (GABAPENTIN) take 1 capsules 3 times daily NOVOLOG MIX 70/30 70-30 % SUSP (INSULIN ASPART PROT & ASPART) Inject 48 units in the morning and 30 units before the evening meal. HYDROCODONE-ACETAMINOPHEN 5-325 MG TABS (HYDROCODONE-ACETAMINOPHEN) Take one tab by mouth every 4-6 hours as needed for pain. ACCUPRIL 20 MG TABS (QUINAPRIL HCL) Take 1 tablet by mouth once a day HYDROCHLOROTHIAZIDE 25 MG TABS (HYDROCHLOROTHIAZIDE) Take 1 tablet by mouth once a day CIPRO 500 MG TABS (CIPROFLOXACIN HCL) two times a day METRONIDAZOLE 500 MG TABS (METRONIDAZOLE) Take 1 tablet by mouth two times a day AMLODIPINE BESYLATE 10 MG TABS (AMLODIPINE BESYLATE) Take 1 tablet by mouth once a day   SOCIAL HISTORY: Social History: Smokes 1 ppd x 36 years, drink occasionally, uses marijuana  FAMILY HISTORY  Mother: CHF, DM,  Father: HTN, lung cancer from asbestos, DM  ROS: As per HPI, all other systems reviewed and negative VITALS: T: 97.8 P:97  BP:122/80  R:18  O2SAT:99  ON:RA PHYSICAL EXAM: GEN: NAD HEENT: PERRLA, EOMI Pulmonary: CTAB, no wheezes, no crackles Cardiovascular: S1 S2, RRR, no m/g/r Abdomen: soft, NT, ND, + BS, no guarding or rebound EXT: no edema, no cyanosis, +2DP pulses, Right 1st toe on plantar aspect: 2x2 cm opened wound with pink granulation tissue without tenderness or drainage, small 5mm x 5mm circular ulcer without drainage.  Multiple calluses on toes and fungus in between nail webs.  NEURO: A&O x3 , motor strength 5/5 in all 4 extremities, no sensation on bilateral feet, CN II-XII grossly intact   LABS: WBC  8.8               4.0-10.5         K/uL  RBC                                      4.03       l      4.22-5.81        MIL/uL  Hemoglobin (HGB)                         11.7       l      13.0-17.0        g/dL  Hematocrit (HCT)                         36.2       l      39.0-52.0        %  MCV                                       89.8              78.0-100.0       fL  MCH -                                    29.0              26.0-34.0        pg  MCHC                                     32.3              30.0-36.0        g/dL  RDW                                      13.0              11.5-15.5        %  Platelet Count (PLT)                     407        h      150-400          K/uL  Neutrophils, %                           60                43-77            %  Lymphocytes, %                           27                12-46            %  Monocytes, %  10                3-12             %  Eosinophils, %                           3                 0-5              %  Basophils, %                             1                 0-1              %  Neutrophils, Absolute                    5.3               1.7-7.7          K/uL  Lymphocytes, Absolute                    2.3               0.7-4.0          K/uL  Monocytes, Absolute                      0.9               0.1-1.0          K/uL  Eosinophils, Absolute                    0.2               0.0-0.7          K/uL  Basophils, Absolute                      0.1               0.0-0.1          K/uL   Sodium (NA)                              137               135-145          mEq/L  Potassium (K)                            4.2               3.5-5.1          mEq/L  Chloride                                 102               96-112           mEq/L  CO2  25                19-32            mEq/L  Glucose                                  227        h      70-99            mg/dL  BUN                                      17                6-23             mg/dL  Creatinine                               1.52       h      0.4-1.5          mg/dL  GFR, Est Non African American            50         l      >60              mL/min  GFR, Est African American                >60               >60               mL/min    Oversized comment, see footnote  1  Bilirubin, Total                         0.3               0.3-1.2          mg/dL  Alkaline Phosphatase                     105               39-117           U/L  SGOT (AST)                               17                0-37             U/L  SGPT (ALT)                               11                0-53             U/L  Total  Protein                           7.1               6.0-8.3  g/dL  Albumin-Blood                            3.4        l      3.5-5.2          g/dL  Calcium                                  9.2               8.4-10.5         mg/dL  ASSESSMENT AND PLAN: 1. Right great toe ulcer secondary to diabetes, concerning for osteomyelitis.     -Will admit patient to regular floor    -Blood culture x2   - Get X-ray of right foot   - MRI of right foot to r/o osteomyelitis   - IV antibiotics: Vancomycin and Zosyn   - Morphine for pain control as needed   - Will get ortho consult   2. DM type II- poorly controlled.  HbA1c 12.6 on 05/19/10.  CBGs 400s.     - Will start SSI - sensitive for now with meal coverage and hs coverage   - check CBgs three times a day ac, hs   - Will check FLP  3. Hx of polysubstance abuse:    - Nicoderm patch   - SW consult for substance abuse counseling  4. HTN: well-controlled    - Will continue home meds: Lisinopril  VTE prophylaxis: Heparin Subcutaneously 5000u three times a day   ATTENDING: I performed and/or observed a history and physical examination of the patient.  I discussed the case with the residents as noted and reviewed the resident's notes.  I agree with the findings and plan- please refer to the attending physician note for more details.  Signature________________________________________________  Printed name_____________________________________________

## 2010-06-07 ENCOUNTER — Inpatient Hospital Stay (HOSPITAL_COMMUNITY): Payer: Self-pay

## 2010-06-07 ENCOUNTER — Emergency Department (HOSPITAL_COMMUNITY): Payer: Self-pay

## 2010-06-07 ENCOUNTER — Inpatient Hospital Stay (HOSPITAL_COMMUNITY)
Admission: EM | Admit: 2010-06-07 | Discharge: 2010-06-15 | DRG: 617 | Disposition: A | Discharge status: Home Health | Payer: Self-pay | Attending: Internal Medicine | Admitting: Internal Medicine

## 2010-06-07 ENCOUNTER — Encounter: Payer: Self-pay | Admitting: Internal Medicine

## 2010-06-07 DIAGNOSIS — F1410 Cocaine abuse, uncomplicated: Secondary | ICD-10-CM | POA: Diagnosis present

## 2010-06-07 DIAGNOSIS — F17200 Nicotine dependence, unspecified, uncomplicated: Secondary | ICD-10-CM | POA: Diagnosis present

## 2010-06-07 DIAGNOSIS — I129 Hypertensive chronic kidney disease with stage 1 through stage 4 chronic kidney disease, or unspecified chronic kidney disease: Secondary | ICD-10-CM | POA: Diagnosis present

## 2010-06-07 DIAGNOSIS — F1010 Alcohol abuse, uncomplicated: Secondary | ICD-10-CM | POA: Diagnosis present

## 2010-06-07 DIAGNOSIS — IMO0002 Reserved for concepts with insufficient information to code with codable children: Principal | ICD-10-CM | POA: Diagnosis present

## 2010-06-07 DIAGNOSIS — E785 Hyperlipidemia, unspecified: Secondary | ICD-10-CM | POA: Diagnosis present

## 2010-06-07 DIAGNOSIS — R05 Cough: Secondary | ICD-10-CM | POA: Diagnosis present

## 2010-06-07 DIAGNOSIS — R059 Cough, unspecified: Secondary | ICD-10-CM | POA: Diagnosis present

## 2010-06-07 DIAGNOSIS — L089 Local infection of the skin and subcutaneous tissue, unspecified: Secondary | ICD-10-CM

## 2010-06-07 DIAGNOSIS — Z905 Acquired absence of kidney: Secondary | ICD-10-CM

## 2010-06-07 DIAGNOSIS — F1210 Cannabis abuse, uncomplicated: Secondary | ICD-10-CM | POA: Diagnosis present

## 2010-06-07 DIAGNOSIS — M9080 Osteopathy in diseases classified elsewhere, unspecified site: Secondary | ICD-10-CM | POA: Diagnosis present

## 2010-06-07 DIAGNOSIS — E1165 Type 2 diabetes mellitus with hyperglycemia: Principal | ICD-10-CM | POA: Diagnosis present

## 2010-06-07 DIAGNOSIS — D649 Anemia, unspecified: Secondary | ICD-10-CM | POA: Diagnosis present

## 2010-06-07 DIAGNOSIS — Z560 Unemployment, unspecified: Secondary | ICD-10-CM

## 2010-06-07 DIAGNOSIS — B951 Streptococcus, group B, as the cause of diseases classified elsewhere: Secondary | ICD-10-CM | POA: Diagnosis present

## 2010-06-07 DIAGNOSIS — R7881 Bacteremia: Secondary | ICD-10-CM | POA: Diagnosis present

## 2010-06-07 DIAGNOSIS — M869 Osteomyelitis, unspecified: Secondary | ICD-10-CM | POA: Diagnosis present

## 2010-06-07 DIAGNOSIS — N183 Chronic kidney disease, stage 3 unspecified: Secondary | ICD-10-CM | POA: Diagnosis present

## 2010-06-07 DIAGNOSIS — Z794 Long term (current) use of insulin: Secondary | ICD-10-CM

## 2010-06-07 LAB — COMPREHENSIVE METABOLIC PANEL
ALT: 12 U/L (ref 0–53)
AST: 13 U/L (ref 0–37)
Albumin: 3.2 g/dL — ABNORMAL LOW (ref 3.5–5.2)
Alkaline Phosphatase: 122 U/L — ABNORMAL HIGH (ref 39–117)
Potassium: 4.2 mEq/L (ref 3.5–5.1)
Sodium: 135 mEq/L (ref 135–145)
Total Protein: 7.8 g/dL (ref 6.0–8.3)

## 2010-06-07 LAB — RAPID URINE DRUG SCREEN, HOSP PERFORMED
Amphetamines: NOT DETECTED
Barbiturates: NOT DETECTED
Benzodiazepines: NOT DETECTED
Tetrahydrocannabinol: POSITIVE — AB

## 2010-06-07 LAB — DIFFERENTIAL
Eosinophils Absolute: 0.2 10*3/uL (ref 0.0–0.7)
Eosinophils Relative: 1 % (ref 0–5)
Lymphocytes Relative: 9 % — ABNORMAL LOW (ref 12–46)
Monocytes Absolute: 1.1 10*3/uL — ABNORMAL HIGH (ref 0.1–1.0)
Neutrophils Relative %: 83 % — ABNORMAL HIGH (ref 43–77)

## 2010-06-07 LAB — GLUCOSE, CAPILLARY: Glucose-Capillary: 306 mg/dL — ABNORMAL HIGH (ref 70–99)

## 2010-06-07 LAB — CBC
Platelets: 403 10*3/uL — ABNORMAL HIGH (ref 150–400)
RDW: 12.4 % (ref 11.5–15.5)
WBC: 15.2 10*3/uL — ABNORMAL HIGH (ref 4.0–10.5)

## 2010-06-08 DIAGNOSIS — L089 Local infection of the skin and subcutaneous tissue, unspecified: Secondary | ICD-10-CM

## 2010-06-08 LAB — BASIC METABOLIC PANEL
BUN: 16 mg/dL (ref 6–23)
CO2: 21 mEq/L (ref 19–32)
Chloride: 100 mEq/L (ref 96–112)
Creatinine, Ser: 1.56 mg/dL — ABNORMAL HIGH (ref 0.4–1.5)
Potassium: 4.2 mEq/L (ref 3.5–5.1)

## 2010-06-08 LAB — CBC
HCT: 31.4 % — ABNORMAL LOW (ref 39.0–52.0)
Hemoglobin: 10.8 g/dL — ABNORMAL LOW (ref 13.0–17.0)
MCH: 30.1 pg (ref 26.0–34.0)
MCV: 87.5 fL (ref 78.0–100.0)
Platelets: 376 10*3/uL (ref 150–400)
RBC: 3.59 MIL/uL — ABNORMAL LOW (ref 4.22–5.81)
WBC: 14.6 10*3/uL — ABNORMAL HIGH (ref 4.0–10.5)

## 2010-06-08 LAB — GLUCOSE, CAPILLARY
Glucose-Capillary: 244 mg/dL — ABNORMAL HIGH (ref 70–99)
Glucose-Capillary: 288 mg/dL — ABNORMAL HIGH (ref 70–99)
Glucose-Capillary: 155 mg/dL — ABNORMAL HIGH (ref 70–99)

## 2010-06-08 NOTE — Op Note (Signed)
  [name], [name]                 ACCOUNT NO.:  [id number]  MEDICAL RECORD NO.:  [id number]           PATIENT TYPE:  I  LOCATION:  5504                         FACILITY:  [facility]  PHYSICIAN:  Burnard Bunting, M.D.    DATE OF BIRTH:  October 07, 1965  DATE OF PROCEDURE:  05/21/2010 DATE OF DISCHARGE:                              OPERATIVE REPORT   PREOPERATIVE DIAGNOSIS:  Right great toe osteomyelitis.  POSTOPERATIVE DIAGNOSIS:  Right great toe osteomyelitis.  PROCEDURE:  Right great toe amputation.  SURGEON:  Burnard Bunting, MD  ASSISTANT:  None.  ANESTHESIA:  Ankle block plus sedation.  INDICATIONS:  [name] is a 45 year old patient with right great toe infection who presents for operative management after explanation of risks and benefits.  PROCEDURE IN DETAIL:  The patient was brought to the operating room where general endotracheal anesthesia was used.  Preoperative antibiotics were administered.  Time-out was called.  [place] type incision was made around the right great toe.  We had to go proximal to the 2 x 2 cm ulceration on the medial aspect of the toe.  Skin flaps were elevated.  The toe was removed to the IP joint.  About half of the proximal phalanx also had to be removed in order to close the skin due to the additional skin resection required to avoid the ulcerated area. Following the bony resection, which was done in a doubled angle, the incision was thoroughly irrigated.  The ankle Esmarch was released. Bleeding points were encountered and controlled using electrocautery. Capsule was closed over the bone using 3-0 Vicryl suture.  Skin edges were reapproximated using 3-0 nylon suture.  Bulky dressing was applied. The patient tolerated the procedure well without immediate complications.     Burnard Bunting, M.D.     GSD/MEDQ  D:  05/21/2010  T:  05/21/2010  Job:  [id number]  Electronically Signed by Reece Agar.  Zavier Canela M.D. on 06/08/2010 08:39:58 AM

## 2010-06-08 NOTE — Consult Note (Signed)
[name], [name]                 ACCOUNT NO.:  [id number]  MEDICAL RECORD NO.:  [id number]           PATIENT TYPE:  I  LOCATION:  5504                         FACILITY:  [facility]  PHYSICIAN:  Burnard Bunting, M.D.    DATE OF BIRTH:  1965/06/10  DATE OF CONSULTATION: DATE OF DISCHARGE:                                CONSULTATION   REQUESTING PHYSICIAN:  Carrolyn Meiers, MD  CHIEF COMPLAINT:  Right foot and right toe infection.  HISTORY OF PRESENT ILLNESS:  [name] is a 45 year old patient with history of diabetes for 9 years who has noted about a month long history of drainage from the right toe.  He is now admitted currently for workup of this problem with diabetic management.  Had some purulent discharge from the right foot without any real tenderness or systemic symptoms. The patient describes numbness to both of his feet.  Occasionally, he has pain in both of his leg.  Currently unemployed.  He denies any systemic symptoms of infection such as fevers or chills.  In general, the patient's diabetes is under poor control.  He states that CBGs runs in the 400-500s, last hemoglobin A1c 12.6 on May 19, 2010.  ALLERGIES:  The patient has no known drug allergies.  PAST MEDICAL HISTORY:  Notable for: 1. Diabetes type 1. 2. History of left foot cellulitis in December 2009. 3. Pulmonary hypertension. 4. Hyperlipidemia. 5. Polysubstance abuse. 6. Alcoholic pancreatitis in May 2010. 7. Chronic pyelonephritis. 8. History of ulcerative esophagitis. 9. History of Mallory Weiss tear in April 2009. 10.History of renal insufficiency, baseline creatinine 1.2-1.5.  The     patient had left kidney removed in 2010.  The patient is with     significant other and lives in [place], is unemployed.  SOCIAL HISTORY:  As noted in history of polysubstance abuse.  He smokes 1 pack a day for 36 years.  Drinks occasionally, uses marijuana.  FAMILY HISTORY:  Notable for mother with CHF and  diabetes.  Father with hypertension and lung cancer.  MEDICATIONS:  Currently include vancomycin, Zosyn, insulin sliding scale, nicotine patch, aspirin, heparin injection, Colace, Pepcid, gabapentin, lisinopril, and morphine sulfate.  REVIEW OF SYSTEMS:  All systems reviewed are negative except the right foot.  PHYSICAL EXAMINATION:  GENERAL:  He is a well-developed, well-nourished in no acute stress, alert and oriented and answers questions appropriately. VITAL SIGNS:  Temperature 97.8, blood pressure 120/80, respirations 18, O2 sat 99%. HEENT:  Extraocular movements intact. EXTREMITIES:  He has palpable pedal pulses bilaterally.  Does have a generally dry skin on the soles of his feet.  He does have chronic fungal infections in the lesser toes on the right foot.  He has about a 1.5 x 1.5 cm ulcer over the medial aspect of the proximal phalanx of the great toe.  Some drainage is present.  There is no fluctuance, erythema, induration around the great toe region.  He does have little bit of callus formation over the third metatarsal head plantar.  He has palpable nontender anterior tib, posterior tib, peroneal, and Achilles tendon.  LABORATORY VALUES:  White count  8.8, hemoglobin 11.7, platelets 407. MRI scan done today is reviewed shows osteomyelitis of the distal tuft.  IMPRESSION:  Right great toe osteomyelitis.  The patient with poorly controlled diabetes.  This is a potential limb threatening infection, although it does not seem to be severe at this time.  Plan is for MTP amputation.  We will have to go little bit more proximal than just to distal phalanx because of the ulceration which is more proximal in the toe.  I discussed with the patient risks and benefits of this procedure, potential gait abnormalities which may result as well as the potential for nonhealing of the wound.  The patient understands risks and benefits and wishes to proceed.  Medical decision making is  complicated by multiple medical comorbidities as well as decision for surgery.  All questions were answered.     Burnard Bunting, M.D.     GSD/MEDQ  D:  05/20/2010  T:  05/21/2010  Job:  [id number]  Electronically Signed by Reece Agar.  DEAN M.D. on 06/08/2010 08:39:56 AM

## 2010-06-09 ENCOUNTER — Other Ambulatory Visit: Payer: Self-pay | Admitting: Orthopedic Surgery

## 2010-06-09 DIAGNOSIS — L089 Local infection of the skin and subcutaneous tissue, unspecified: Secondary | ICD-10-CM

## 2010-06-09 LAB — IRON AND TIBC
Iron: 24 ug/dL — ABNORMAL LOW (ref 42–135)
TIBC: 166 ug/dL — ABNORMAL LOW (ref 215–435)
UIBC: 142 ug/dL

## 2010-06-09 LAB — BASIC METABOLIC PANEL
BUN: 12 mg/dL (ref 6–23)
CO2: 24 mEq/L (ref 19–32)
Calcium: 8.4 mg/dL (ref 8.4–10.5)
Chloride: 103 mEq/L (ref 96–112)
Creatinine, Ser: 1.54 mg/dL — ABNORMAL HIGH (ref 0.4–1.5)
GFR calc Af Amer: 60 mL/min — ABNORMAL LOW (ref >60)

## 2010-06-09 LAB — GLUCOSE, CAPILLARY
Glucose-Capillary: 235 mg/dL — ABNORMAL HIGH (ref 70–99)
Glucose-Capillary: 101 mg/dL — ABNORMAL HIGH (ref 70–99)

## 2010-06-09 LAB — CBC
MCH: 29.6 pg (ref 26.0–34.0)
MCV: 86.8 fL (ref 78.0–100.0)
Platelets: 408 10*3/uL — ABNORMAL HIGH (ref 150–400)
RDW: 12.4 % (ref 11.5–15.5)
WBC: 11.7 10*3/uL — ABNORMAL HIGH (ref 4.0–10.5)

## 2010-06-09 LAB — FERRITIN: Ferritin: 228 ng/mL (ref 22–322)

## 2010-06-10 ENCOUNTER — Ambulatory Visit: Payer: Medicaid Other | Admitting: Internal Medicine

## 2010-06-10 LAB — GLUCOSE, CAPILLARY
Glucose-Capillary: 199 mg/dL — ABNORMAL HIGH (ref 70–99)
Glucose-Capillary: 59 mg/dL — ABNORMAL LOW (ref 70–99)
Glucose-Capillary: 72 mg/dL (ref 70–99)
Glucose-Capillary: 202 mg/dL — ABNORMAL HIGH (ref 70–99)
Glucose-Capillary: 103 mg/dL — ABNORMAL HIGH (ref 70–99)
Glucose-Capillary: 121 mg/dL — ABNORMAL HIGH (ref 70–99)

## 2010-06-10 LAB — BASIC METABOLIC PANEL
BUN: 7 mg/dL (ref 6–23)
CO2: 24 mEq/L (ref 19–32)
Chloride: 102 mEq/L (ref 96–112)
Glucose, Bld: 455 mg/dL — ABNORMAL HIGH (ref 70–99)
Potassium: 4.4 mEq/L (ref 3.5–5.1)

## 2010-06-10 LAB — CULTURE, BLOOD (ROUTINE X 2): Culture  Setup Time: 201202281953

## 2010-06-10 LAB — CBC
HCT: 30.4 % — ABNORMAL LOW (ref 39.0–52.0)
Hemoglobin: 10.2 g/dL — ABNORMAL LOW (ref 13.0–17.0)
MCH: 29.4 pg (ref 26.0–34.0)
MCV: 87.6 fL (ref 78.0–100.0)
RBC: 3.47 MIL/uL — ABNORMAL LOW (ref 4.22–5.81)

## 2010-06-11 LAB — WOUND CULTURE: Gram Stain: NONE SEEN

## 2010-06-11 LAB — CBC
HCT: 30.4 % — ABNORMAL LOW (ref 39.0–52.0)
MCH: 29.1 pg (ref 26.0–34.0)
MCV: 86.9 fL (ref 78.0–100.0)
Platelets: 444 10*3/uL — ABNORMAL HIGH (ref 150–400)
RBC: 3.5 MIL/uL — ABNORMAL LOW (ref 4.22–5.81)
WBC: 7.4 10*3/uL (ref 4.0–10.5)

## 2010-06-11 LAB — MICROALBUMIN, URINE: Microalb, Ur: 1.48 mg/dL (ref 0.00–1.89)

## 2010-06-11 LAB — GLUCOSE, CAPILLARY
Glucose-Capillary: 282 mg/dL — ABNORMAL HIGH (ref 70–99)
Glucose-Capillary: 42 mg/dL — CL (ref 70–99)
Glucose-Capillary: 127 mg/dL — ABNORMAL HIGH (ref 70–99)
Glucose-Capillary: 151 mg/dL — ABNORMAL HIGH (ref 70–99)

## 2010-06-11 LAB — URINALYSIS, ROUTINE W REFLEX MICROSCOPIC
Bilirubin Urine: NEGATIVE
Glucose, UA: >1000 mg/dL — AB
Hgb urine dipstick: NEGATIVE
Specific Gravity, Urine: 1.027 (ref 1.005–1.030)
pH: 6 (ref 5.0–8.0)

## 2010-06-11 LAB — BASIC METABOLIC PANEL
BUN: 11 mg/dL (ref 6–23)
Chloride: 101 mEq/L (ref 96–112)
Creatinine, Ser: 1.19 mg/dL (ref 0.4–1.5)
Glucose, Bld: 277 mg/dL — ABNORMAL HIGH (ref 70–99)
Potassium: 4.1 mEq/L (ref 3.5–5.1)

## 2010-06-11 LAB — URINE MICROSCOPIC-ADD ON

## 2010-06-12 LAB — BASIC METABOLIC PANEL
BUN: 16 mg/dL (ref 6–23)
Calcium: 9.4 mg/dL (ref 8.4–10.5)
Creatinine, Ser: 1.42 mg/dL (ref 0.4–1.5)
GFR calc non Af Amer: 54 mL/min — ABNORMAL LOW (ref >60)
Glucose, Bld: 274 mg/dL — ABNORMAL HIGH (ref 70–99)
Potassium: 4.7 mEq/L (ref 3.5–5.1)

## 2010-06-12 LAB — GLUCOSE, CAPILLARY: Glucose-Capillary: 148 mg/dL — ABNORMAL HIGH (ref 70–99)

## 2010-06-13 ENCOUNTER — Inpatient Hospital Stay (HOSPITAL_COMMUNITY): Payer: Self-pay

## 2010-06-13 LAB — BASIC METABOLIC PANEL
CO2: 27 mEq/L (ref 19–32)
Chloride: 101 mEq/L (ref 96–112)
GFR calc Af Amer: >60 mL/min (ref >60)
Potassium: 4.5 mEq/L (ref 3.5–5.1)
Sodium: 135 mEq/L (ref 135–145)

## 2010-06-13 LAB — GLUCOSE, CAPILLARY
Glucose-Capillary: 209 mg/dL — ABNORMAL HIGH (ref 70–99)
Glucose-Capillary: 229 mg/dL — ABNORMAL HIGH (ref 70–99)

## 2010-06-14 DIAGNOSIS — L089 Local infection of the skin and subcutaneous tissue, unspecified: Secondary | ICD-10-CM

## 2010-06-14 LAB — BASIC METABOLIC PANEL
Calcium: 9.1 mg/dL (ref 8.4–10.5)
GFR calc Af Amer: >60 mL/min (ref >60)
GFR calc non Af Amer: 52 mL/min — ABNORMAL LOW (ref >60)
Potassium: 4.9 mEq/L (ref 3.5–5.1)
Sodium: 134 mEq/L — ABNORMAL LOW (ref 135–145)

## 2010-06-14 LAB — GLUCOSE, CAPILLARY
Glucose-Capillary: 296 mg/dL — ABNORMAL HIGH (ref 70–99)
Glucose-Capillary: 154 mg/dL — ABNORMAL HIGH (ref 70–99)

## 2010-06-15 ENCOUNTER — Ambulatory Visit (HOSPITAL_COMMUNITY): Payer: Medicaid Other

## 2010-06-15 LAB — CULTURE, BLOOD (ROUTINE X 2)
Culture  Setup Time: 201203011851
Culture: NO GROWTH
Culture: NO GROWTH

## 2010-06-15 LAB — GLUCOSE, CAPILLARY: Glucose-Capillary: 255 mg/dL — ABNORMAL HIGH (ref 70–99)

## 2010-06-16 NOTE — Initial Assessments (Signed)
INTERNAL MEDICINE ADMISSION HISTORY AND PHYSICAL  PCP: Dr. Cena Benton  1st contact Ayesha Mohair  [phone] 2nd contact Dr. Arvilla Market [phone] Holidays or 5pm on weekdays:  1st contact [phone] 2nd contact [phone]  CC: L big toe cellulitis  HPI:  [name] is a 45yo AAM with PMH T1DM (uncontrolled), HTN, dyslipidemia, former polysubstance abuse who p/w L big toe cellulitis in a very similar presentation to his left toe from two weeks, which was amputated for osteomyelitis.  After discharge on 05/23/10, the patient did not fill any of prescriptions. His only regularly used medication is Humalog 70/30.  After his stitches were removed yesterday, later in the evening the patient noticed his right big toe was hurting, making it very difficult to ambulate.  The pain is worse today.  He also endorses a frequent dry cough.  He denies fever, peripheral edema, nausea, vomiting, diarrhea, constipation, malaise or weight loss.  ALLERGIES: NKDA  PAST MEDICAL HISTORY: Diabetes mellitus, type 1: uncontrolled, 05/16/10 A1c 12.6%   H/o Left Hallux amputation, 05/2010   H/o Left foot cellulitis - left 4th and 5th metatarsal area, 03/2008   Proteinuria Hypertension (untreated) Hyperlipidemia (untreated) Polysubstance abuse (cocaine, marijuana, alcohol).   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 History of Ulcerative esophagitis-severe, complicated with UGI bleed, 2007. History of Mallory-Weiss tear in April 2009.  History of prolonged QT Inadequate material resources Renal Insufficiency (baseline Cr 1.2-1.5)  MEDICATIONS: Home Medications: only filled Humalog Mix 70/30 45U am, 35U pm.    At last discharge 05/23/10, was prescribed the following:  1. Gabapentin 300 mg p.o. daily.   2. Amlodipine 10 mg p.o. daily.   3. Humalog Mix 75/25, inject 45 units in the morning and 35 units in       the evening subcu.   4.  Labetalol 200 mg p.o. b.i.d.   5. Lisinopril 40 mg p.o. daily.   6. Pantoprazole 40 mg p.o. daily.   7. Reglan 10 mg p.o. b.i.d.   SOCIAL HISTORY: Unemployed, seeking disability.  Lives in home inherited from parents.  Has fiancee, [name], who is also seeking disability (epilepsy).  Smokes 1/2 ppd. Began smoking at 45 years old.  65 pack year history.  Last cocaine use over 1 year ago.  Last drink was 3 shots bourbon 3 days ago, previous last drink over 1 month ago.  Has extreme lack of funds.    FAMILY HISTORY: Diabetes and hypertension on both sides of the family.  ROS: As per HPI, all other systems reviewed and negative  VITALS: T: 97.8  P: 101  BP: 121/85  R:  O2SAT: 100% ON: RA  PHYSICAL EXAM: General:  alert, well-developed, and cooperative to examination.   Head:  normocephalic and atraumatic.   Eyes:  vision grossly intact, pupils equal, pupils round, pupils reactive to light, no injection and anicteric.   Mouth:  pharynx pink and moist, no erythema, and no exudates.   Neck:  supple, full ROM, no thyromegaly, no JVD, and no carotid bruits.   Lungs:  normal respiratory effort, no accessory muscle use, normal breath sounds, no crackles, and no wheezes.  Heart:  normal rate, regular rhythm, no murmur, no gallop, and no rub.  Radial and dorsalis pedis pulses 2+ b/l. Abdomen:  soft, non-tender, normal bowel sounds, no distention, no guarding, no rebound tenderness, no hepatomegaly, and no splenomegaly.   Msk:  no joint swelling, no joint warmth, and no redness  over joints.   Pulses:  2+ DP/PT pulses bilaterally Extremities:  No cyanosis, clubbing, edema.  No LAD behind knee, around inguinal area b/l. Neurologic:  alert & oriented X3, cranial nerves II-XII intact, strength normal in all extremities, sensation intact to light touch, and gait normal.   Skin:  Healing eschar on right metatarsal head.  L hallux distended, erythematous, indurated yellowish discoloration around toe and  spreading dorsum of foot, draining serum post-I&D.  Around left foot 5th metatarsal head circular, 2cm mildly erythematous, indurated shallow ulcer.  Dry skin on feet b/l. Psych:  Oriented X3, memory intact for recent and remote, normally interactive, good eye contact, not anxious appearing, and not depressed appearing.   LABS:  Sodium (NA)                              135               135-145          mEq/L  Potassium (K)                            4.2               3.5-5.1          mEq/L  Chloride                                 100               96-112           mEq/L  CO2                                      24                19-32            mEq/L  Glucose                                  248        h      70-99            mg/dL  BUN                                      26         h      6-23             mg/dL  Creatinine                               1.84       h      0.4-1.5          mg/dL  GFR, Est Non African American            40         l      >60              mL/min  GFR,  Est African American                49         l      >60              mL/min    Oversized comment, see footnote  1  Bilirubin, Total                         0.6               0.3-1.2          mg/dL  Alkaline Phosphatase                     122        h      39-117           U/L  SGOT (AST)                               13                0-37             U/L  SGPT (ALT)                               12                0-53             U/L  Total  Protein                           7.8               6.0-8.3          g/dL  Albumin-Blood                            3.2        l      3.5-5.2          g/dL  Calcium                                  9.4               8.4-10.5         mg/dL   ESR (Sedimentation Rate)                 73         h      0-16             mm/hr   WBC                                      15.2       h      4.0-10.5         K/uL    WHITE COUNT CONFIRMED ON SMEAR  RBC  4.13       l      4.22-5.81        MIL/uL  Hemoglobin (HGB)                         12.1       l      13.0-17.0        g/dL  Hematocrit (HCT)                         35.6       l      39.0-52.0        %  MCV                                      86.2              78.0-100.0       fL  MCH -                                    29.3              26.0-34.0        pg  MCHC                                     34.0              30.0-36.0        g/dL  RDW                                      12.4              11.5-15.5        %  Platelet Count (PLT)                     403        h      150-400          K/uL    PLATELET COUNT CONFIRMED BY SMEAR  Neutrophils, %                           83         h      43-77            %  Lymphocytes, %                           9          l      12-46            %  Monocytes, %                             7                 3-12             %  Eosinophils, %                           1                 0-5              %  Basophils, %                             0                 0-1              %  Neutrophils, Absolute                    12.5       h      1.7-7.7          K/uL  Lymphocytes, Absolute                    1.4               0.7-4.0          K/uL  Monocytes, Absolute                      1.1        h      0.1-1.0          K/uL  Eosinophils, Absolute                    0.2               0.0-0.7          K/uL  Basophils, Absolute                      0.0               0.0-0.1          K/uL   ASSESSMENT AND PLAN:  (1)  Cellulitis (erythema, warmth and pain) 2/2 bacterial infection (previous history, WBC 15.2, N 83%).  Had right hallux amputated 2 weeks ago 2/2 osteomyelitis without bacteremia.  Patient did not fill his prescriptions and now has similar infection in left hallux.  Most likely antigens include strep, staph and - considering recent surgery and uncontrolled T1DM - possibly pseudomonal infection.  Denies history of animal bite.  SIRS  present (HR 101, WBC 15.2), thoug patient is in no acute distress. - admit to regular floor - BCx x 2 - wound culture pending, obtained by ED - MRI left hallux to r/o osteomyelitis - Vancomycin and Zosyn, renally dosed, to cover MRSA, pseudomonas, aerobic and anaerobic infections. - Morning labs: CBC, BMet to monitor infection, Creatinine - considering patient's relatively low risk of UTI, being a male without any symptoms, no U/A ordered  2) Cough.  Patient has had a dry cough for about 24 hours without complaints of fever, headache, post-nasal drip or shortness of breath.  He may possibly have a upper respiratory infection.  He has not been taking his ACEI, so Lisinopril likely not the culprit.  The patient has not been taking his Protonix.  With his PPI on board, his cough may resolve. - CXR 2 view to r/o infectious etiology. - Robitussin DM (dextromethorphan/guanefesin) as needed cough.  3) H/o polysubstance abuse - Nicotine patch for cigarette abuse - Not starting CIWA protocol - UDS ordered  4) Chronic Kidney Disease: Baseline 1.4- 1.6, with MDRD GFR 57.  However, 2/2 chronic pyelonephritis, his left kidney is nonfunctional.  Considered borderline CKD Stage II/III at this time. His Cr is 1.8 on admission.  Can has unimpaired oral intake. - Antibiotics are renally dosed - Morning BMet ordered - Encouraged to drink fluids. No lasix ordered for now. - started Lisinopril at 10mg  by mouth qday, considering that patient has not taken ACEI after discharge.  Ideal to be on ACEI, with his diabetes and CKD.  5) T2DM: Uncontrolled, with A1c 12.6% on 05/19/10. The only home medication he takes is Humalog 70/30 45U in morning, 35U before evening meal.  He does not check CBG at home.  Has significant financial limitations. - Check CBG - Start Humalog 70/30 45U qam, 35U before evening meal.  6) Essential hypertension: patient's BP is 121/85 on no medications.  Last TSH 2.47 05/20/10. - We will not  start his labetolol prescription.  Will consider diuretic or bb should blood pressure increase over hospital stay.  7) Dyslipidemia: 05/20/10 FLP was Tchol 168, Tgy 105, HDL 54, LDL 86. - Considering patient's uncontrolled diabetes, starting statin and aspirin therapy could be helpful.  However, patient has severe financial restrictions and would not fill these medications.  Considering his recently resolved HTN, his 22yr CV risk is 13.4%.  We will not start ASA 2/2 possible toe amputation. - Simvastatin 40mg  qdaily.  this may be possibly cheaper than ASA, though we will encourage use of both at home.  8) GERD:  - Protonix  9) FEN/GI: - will advance to regular diet, considering lack of diet adherence at home.  10) VTE prophylaxis: - Heparin   ATTENDING: I performed and/or observed a history and physical examination of the patient.  I discussed the case with the residents as noted and reviewed the residents' notes.  I agree with the findings and plan--please refer to the attending physician note for more details.  Signature________________________________  Printed Name_____________________________

## 2010-06-24 ENCOUNTER — Encounter: Payer: Self-pay | Admitting: Internal Medicine

## 2010-06-24 ENCOUNTER — Ambulatory Visit (INDEPENDENT_AMBULATORY_CARE_PROVIDER_SITE_OTHER): Payer: Self-pay | Admitting: Internal Medicine

## 2010-06-24 VITALS — BP 123/73 | HR 102 | Temp 97.7°F | Ht 67.0 in | Wt 216.7 lb

## 2010-06-24 DIAGNOSIS — L97509 Non-pressure chronic ulcer of other part of unspecified foot with unspecified severity: Secondary | ICD-10-CM

## 2010-06-24 DIAGNOSIS — E119 Type 2 diabetes mellitus without complications: Secondary | ICD-10-CM

## 2010-06-24 DIAGNOSIS — E11621 Type 2 diabetes mellitus with foot ulcer: Secondary | ICD-10-CM

## 2010-06-24 DIAGNOSIS — Z598 Other problems related to housing and economic circumstances: Secondary | ICD-10-CM

## 2010-06-24 DIAGNOSIS — E1169 Type 2 diabetes mellitus with other specified complication: Secondary | ICD-10-CM

## 2010-06-24 DIAGNOSIS — E1149 Type 2 diabetes mellitus with other diabetic neurological complication: Secondary | ICD-10-CM

## 2010-06-24 DIAGNOSIS — I10 Essential (primary) hypertension: Secondary | ICD-10-CM

## 2010-06-24 MED ORDER — GABAPENTIN 300 MG PO CAPS: 300.0000 mg | ORAL_CAPSULE | Freq: Two times a day (BID) | ORAL | Status: DC

## 2010-06-24 MED ORDER — HYDROCHLOROTHIAZIDE 25 MG PO TABS: 25.0000 mg | ORAL_TABLET | Freq: Every day | ORAL | Status: DC

## 2010-06-24 MED ORDER — INSULIN ASPART PROT & ASPART (70-30 MIX) 100 UNIT/ML ~~LOC~~ SUSP: SUBCUTANEOUS | Status: DC

## 2010-06-24 MED ORDER — LISINOPRIL 40 MG PO TABS: 40.0000 mg | ORAL_TABLET | Freq: Every day | ORAL | Status: DC

## 2010-06-24 NOTE — Assessment & Plan Note (Addendum)
S/p amputation. Surgical incision without drainage, erythema or swelling. Continue Rocephin for total 28 days. Patient needs close follow up in 2 weeks.

## 2010-06-24 NOTE — Assessment & Plan Note (Signed)
Blood glucose levels reviewed. Patient is taking Novolog 70/30 as described above at 5am/5pm. He is having lows ranging 40-50 at around 10am which is most likely due to the short acting component of the insulin that he is taking at 5am. Patient instructed to inject Novolog 70/30 35 units bid, continue check sugars and bring readings on follow up for further titration.

## 2010-06-24 NOTE — Patient Instructions (Signed)
Make a follow up appointment in 2 weeks. Continue check blood glucose and documenting reading. Bring to recording for next visit. Take all medications as directed.

## 2010-06-24 NOTE — Assessment & Plan Note (Signed)
Will refer to Child psychotherapist for further support.

## 2010-06-24 NOTE — Progress Notes (Signed)
  Subjective:    Patient ID: [name], male    DOB: [birth year], 45 y.o.   MRN: [id number]  HPI  45 yo man with  Past Medical History  Diagnosis Date  . Onychomycosis   . Hypertension   . 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 Diabetic peripheral neuropathy   . Chronic renal insufficiency   . Diabetic foot ulcer 12/09    left plantar area  . Diabetes mellitus   . History of drug abuse     Cocaine and marijuana  . Tobacco abuse   . Exposure to trichomonas     treated empirically  . Cellulitis of left foot 03/2008    left 4th and 5th metatarsal area  . Hyperlipidemia   . 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 Ulcerative esophagitis 2007    severe, complicated with UGI bleed  . Mallory Janann August tear April 2009  . History of prolonged Q-T interval on ECG   . 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   comes to the clinic for follow up of osteomyelitis of left toe s/p amputation and Group B strep bacteremia. Patient reports that he is taking all medication as directed except for norvasc which he does not have. Patient reports to have full course of Ceftriaxone. Patient saw Dr. August Saucer on Monday 06/20/10. He is to follow up next week to remove stitches. Denies any fever/chills, new drainage.  Diabetes: Patient reports that in the last week he has been having hypoglycemic events at 10am. Lowest blood glucose level 49. Patient is taking insulin at 5pm and 5am.   Review of Systems  [all other systems reviewed and are negative       Objective:   Physical Exam  Constitutional: He is oriented to person, place, and time. He appears well-developed and well-nourished. No distress.  HENT:  Mouth/Throat: Oropharynx is clear and moist.  Eyes: Conjunctivae and EOM are normal. Pupils are equal, round, and reactive to light.  Neck: Normal range of motion. Neck supple.  Cardiovascular: Normal rate, regular rhythm and  normal heart sounds.   Pulmonary/Chest: Effort normal and breath sounds normal.  Abdominal: Soft. Bowel sounds are normal.  Musculoskeletal: Normal range of motion. He exhibits no edema.  Neurological: He is alert and oriented to person, place, and time.  Skin:       Right first toe amputation>no erythema, swelling, or drainage Left first toe amputation> serosanguineous drainage, no erythema or swelling, stitches in place  Psychiatric: He has a normal mood and affect.          Assessment & Plan:

## 2010-06-24 NOTE — Assessment & Plan Note (Signed)
Controlled. Continue current regimen. Check bmet.

## 2010-06-24 NOTE — Assessment & Plan Note (Signed)
Controlled. Continue current regimen. 

## 2010-06-27 NOTE — Progress Notes (Signed)
Called to [facility]

## 2010-06-27 NOTE — Progress Notes (Signed)
Call[facility] to [facility] [place] 

## 2010-06-27 NOTE — Progress Notes (Signed)
Called to [facility], [employer]

## 2010-06-28 ENCOUNTER — Telehealth: Payer: Self-pay | Admitting: *Deleted

## 2010-06-28 NOTE — Telephone Encounter (Signed)
Call from [place] with [facility] wanting to inform us the she received a call today and told pt needed to go out of town.  [facility] sees pt for IV antibiotics.  PICC line is still in, the plan was to have it pulled today.   They can not get in touch with pt and she wanted you to be aware of this.  Michelle's # [phone]

## 2010-06-29 ENCOUNTER — Telehealth: Payer: Self-pay | Admitting: Licensed Clinical Social Worker

## 2010-06-29 NOTE — Telephone Encounter (Signed)
Left message for patient to call SW.
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
## 2010-07-06 ENCOUNTER — Ambulatory Visit: Payer: Self-pay | Admitting: Internal Medicine

## 2010-07-07 NOTE — Op Note (Signed)
  [name], [name]                 ACCOUNT NO.:  [id number]  MEDICAL RECORD NO.:  [id number]           PATIENT TYPE:  LOCATION:                                 FACILITY:  PHYSICIAN:  Burnard Bunting, M.D.    DATE OF BIRTH:  [birth year]  DATE OF PROCEDURE:  06/09/2010 DATE OF DISCHARGE:                              OPERATIVE REPORT   PREOPERATIVE DIAGNOSIS:  Left great toe osteomyelitis.  POSTOPERATIVE DIAGNOSIS:  Left great toe osteomyelitis.  PROCEDURE:  Left great toe metatarsophalangeal joint amputation.  SURGEON:  Burnard Bunting, MD  ASSISTANT:  None.  ANESTHESIA:  General endotracheal.  ESTIMATED BLOOD LOSS:  Minimal.  INDICATIONS:  [name] is a patient with uncontrolled diabetes with left great toe osteomyelitis presents for operative management after explanation of risks and benefits.  SPECIMENS:  Toe sent to pathology x1.  PROCEDURE IN DETAIL:  The patient was brought to the operating room where general endotracheal anesthesia was induced.  Preoperative antibiotics were administered.  Left toe and foot were prepped with Hibiclens saline and draped in a sterile manner.  The patient had a plantar ulcer on the toe which had drainage and odiferous material. Ankle Esmarch was utilized for approximately 10 minutes. Circumferential fishmouth-type incision adjusted for skin that was macerated at the web space was then made.  Skin and subcutaneous tissue were sharply divided and dissection was taken down to the bone.  There was significant plantar soft tissue phlegmon and nonviability, this was resected.  Because of this plantar tissue which required additional resection, an amputation through the IP joint was not possible.  The MTP joint was subsequently dissected down, and the proximal phalanx was removed.  This allowed the flap closure without undue tension and with the ability to resect all devitalized tissue.  The flaps were then brought together and closed using  interrupted inverted 3-0 Vicryl suture and 3-0 nylon after thorough irrigation and releasing of the ankle Esmarch.  Bleeding points encountered were controlled using electrocautery prior to closure.  Nice tension-free closure was obtained.  Bulky dressing was applied.  The patient tolerated the procedure well without immediate complications.  He was transferred to recovery room in stable condition.     Burnard Bunting, M.D.     GSD/MEDQ  D:  06/09/2010  T:  06/10/2010  Job:  [id number]  Electronically Signed by Reece Agar.  Siomara Burkel M.D. on 07/07/2010 08:57:36 AM

## 2010-07-07 NOTE — Consult Note (Signed)
[name]                 ACCOUNT NO.:  [id number]  MEDICAL RECORD NO.:  [id number]           PATIENT TYPE:  LOCATION:                                 FACILITY:  PHYSICIAN:  Burnard Bunting, M.D.    DATE OF BIRTH:  1966/03/12  DATE OF CONSULTATION: DATE OF DISCHARGE:                                CONSULTATION   REQUESTING PHYSICIAN:  Antoine Primas, DO  CHIEF COMPLAINT:  Left toe infection.  HISTORY OF PRESENT ILLNESS:  [name] is a 45 year old patient who recently underwent right great toe amputation for osteomyelitis of the distal phalanx.  He was doing well with that and presented now with left toe swelling, odiferous drainage, and cellulitis.  He is admitted to the Internal Medicine Teaching Service for antibiotics.  He denies any history of injury or trauma to the left foot or toe.  He has had great difficulty controlling his diabetes.  PAST MEDICAL HISTORY:  Notable for: 1. Type 1 uncontrolled diabetes, hemoglobin A1c 12.6 on May 16, 2010. 2. History of left hallux amputation on May 30, 2010. 3. History of left foot cellulitis in December 2009. 4. Proteinuria. 5. Hypertension. 6. Hyperlipidemia. 7. Polysubstance abuse. 8. Alcoholic pancreatitis. 9. Chronic pyelonephritis. 10.History of ulcerative esophagitis. 11.History of Mallory-Weiss tear in April 2009. 12.History of prolonged QT, inadequate material resources. 13.Renal insufficiency.  The patient has no known drug allergies.  CURRENT MEDICATIONS:  On the last discharge: 1. Gabapentin 300 mg a day. 2. Amlodipine 10 mg daily. 3. Humalog Mix 45 units in the morning, 35 units in the evening. 4. Labetalol 200 mg twice a day. 5. Lisinopril 40 mg daily. 6. Pantoprazole 40 mg p.o. daily. 7. Reglan.  The patient is unemployed, seeking disability, lives at home and here with parents and fiancee.  He is also seeking disability from epilepsy. The patient does smoke, 65-pack-year  history.  FAMILY HISTORY:  Notable for diabetes and hypertension on both sides of family.  All other systems reviewed are negative as they relate to the left toe.  PHYSICAL EXAMINATION:  GENERAL:  He is well developed, well nourished, in no acute distress, alert and oriented, appropriate, cooperative.  He answers questions appropriately. VITAL SIGNS:  Temperature is 97.8, pulse is 80, blood pressure 120/85, O2 sat 100% on room air. HEENT:  Extraocular movements intact. EXTREMITIES:  He does have bilateral pedal pulses.  The right great toe amputation is healing well without evidence of the drainage, erythema, or cellulitis.  Left great toe has plantar ulceration with onychomycosis in the toes along with fairly significant drying callus skin on the plantar aspect of the heel, no evidence of ongoing infection or breakdown in the skin between the toes.  Pulses are intact.  Sensation is diminished on the dorsal and palmar aspects of both feet.  No lesser toe deformities are noted on the left foot.  He has palpable, intact, nontender anterior tib, posterior peroneal, and Achilles tendon.  No coarse crepitus is noted in the tissues of the foot.  LABORATORY VALUES:  Sodium 135, potassium 4.2, creatinine 1.84.  White count  15.2 on admission, hemoglobin 12.1.  MRI scan does show osteomyelitis of the distal phalanx, drainage and phlegmon is present.  IMPRESSION:  Left great toe osteomyelitis.  Plan is for amputation.  We will likely have to go through the proximal phalanx with the beveling of the proximal phalanx in order to achieve soft tissue coverage.  Risks and benefits were discussed with the patient.  All questions answered.  Medical decision-making is complicated today by decision for surgery and the potential limb threatening infection in the face of a fairly uncontrolled diabetes. All questions were answered.     Burnard Bunting, M.D.     GSD/MEDQ  D:  06/09/2010  T:   06/10/2010  Job:  [id number]  Electronically Signed by Reece Agar.  Keeven Matty M.D. on 07/07/2010 08:57:33 AM

## 2010-07-12 ENCOUNTER — Telehealth: Payer: Self-pay | Admitting: Licensed Clinical Social Worker

## 2010-07-12 NOTE — Discharge Summary (Signed)
[name], TANNEY                 ACCOUNT NO.:  [id number]  MEDICAL RECORD NO.:  [id number]           PATIENT TYPE:  I  LOCATION:  5504                         FACILITY:  [facility]  PHYSICIAN:  Lacretia Leigh. Arisa Congleton, M.D.DATE OF BIRTH:  [birth year]  DATE OF ADMISSION:  05/19/2010 DATE OF DISCHARGE:  05/23/2010                              DISCHARGE SUMMARY   DISCHARGE DIAGNOSES: 1. Right great toe ulcer/osteomyelitis.   2. Diabetes type 2, poorly controlled with a hemoglobin A1c of 12.6 on     May 19, 2010. 3. Polysubstance abuse. 4. Hypertension.  DISCHARGE MEDICATIONS: 1. Gabapentin 300 mg p.o. daily. 2. Amlodipine 10 mg p.o. daily. 3. Humalog Mix 75/25, inject 45 units in the morning and 35 units in     the evening subcu. 4. Labetalol 200 mg p.o. b.i.d. 5. Lisinopril 40 mg p.o. daily. 6. Pantoprazole 40 mg p.o. daily. 7. Reglan 10 mg p.o. b.i.d.  DISPOSITION AND FOLLOWUP:  [name] was discharged from [facility] on May 23, 2010, in stable and improved condition.  He is status post right great toe amputation by Dr. Dorene Grebe, he had no fever, no tenderness or drainage from his surgery site.  He has no nausea and vomiting or fever.  He needs to continue taking his Levaquin x2 weeks.  If he later develops nausea, vomiting, fever, drainage, erythema around his amputation site, he needs to make an appointment with Dr. Dorene Grebe for further evaluation or go to the ED.  He will have an appointment with his primary care doctor, Dr. Cena Benton on June 10, 2010 at 2:15.  He also has an appointment with Dr. Dorene Grebe on May 30, 2010, at 2:45 p.m.  At that time, he will need 1. Follow up on his amputation. 2. Follow up on his diabetes, poorly controlled with CBGs ranging from     400s to 500s. 3. Follow up on his hypertension.  CONSULTATION:  Orthopedic Surgery with Dr. Dorene Grebe.  PROCEDURE PERFORMED: 1. X-ray of right foot shows forefoot soft tissue swelling.   No     radiographic evidence of osteomyelitis. 2. MR of right foot on May 20, 2010, shows open wound and     cellulitis involving the great toe with underlying osteomyelitis.  ADMISSION HISTORY:  [name] is a 45 year old man with past medical history of diabetes x9 years, hypertension, presented to the outpatient clinic for a 74-month history of right foot ulcer.  He endorses yellow purulent discharge from his right ulcer without any tenderness.  His numbness on both of his feet and that he would shake it off to relief his numbness.  He does have pain that shoot up from both of his legs and that his CBG usually ranged from 400s to 500s, and a hemoglobin A1c of 12.6 on May 19, 2010.  He denies any shortness of breath, chest pain, fever, nausea, vomiting, urinary or bowel symptoms.  He reports having blurry vision, however, he states that because of his high sugar and that he wears glasses.  He was last seen by ophthalmologist 1 year ago and last seen  podiatrist in 2012.  ADMISSION PHYSICAL EXAMINATION:  VITAL SIGNS:  Temperature 97.8, pulse 97, blood pressure 122/80, respiratory rate 18, O2 sat 99% on room air. GENERAL:  No acute distress. HEENT:  Pupils equally round and reactive to light accommodation, extraocular movements intact. PULMONARY:  Clear to auscultation bilaterally.  No wheezes or crackles. CARDIOVASCULAR:  S1 and S2. Regular rate and rhythm.  No murmur, gallop or rub. ABDOMEN:  Soft, nontender, nondistended.  Positive bowel sounds.  No guarding, no rebound. EXTREMITIES:  No edema.  No cyanosis, +2 DP pulses, right first toe on center aspect:  2 x 2-cm open wound with pink granulation tissue without tenderness or drainage, small 5 mm x 5-mm circular ulcer without drainage.  Multiple calluses on toes and fungus between nail webs. NEUROLOGIC:  Alert and oriented x3.  Motor strength 5/5 in all 4 extremities, less sensation on bilateral feet.  Cranial nerves  II through XII grossly intact.  ADMISSION LABORATORIES:  WBC 8.8, hemoglobin 11.7, hematocrit 36.2, MCV 89.8, platelet count 407, neutrophils 60% ANC 5.3.  Sodium 137, potassium 4.2, chloride 102, bicarb 25, BUN 17, creatinine 1.52, glucose 227, total bili 0.3, alk phos 105, AST 17, ALT 11, total protein 7.1, albumin 3.4, calcium 9.2.  HOSPITAL COURSE: 1. Great toe ulcer/osteomyelitis secondary to diabetes. MRI of his right     foot showed open wound and cellulitis involving the great toe with     underlying osteomyelitis.  We consulted Dr. Dorene Grebe who     performed an amputation of his right great toe on May 21, 2010, without any apparent complication.  We also continued     vancomycin and Zosyn x2 days and was later switched to Levaquin     upon discharge.  The blood cultures x4 on May 19, 2010,     preliminary report shows no growth to date.  The patient received     morphine for pain control as needed.  The patient will continue     Levaquin x2 weeks as outpatient for his osteomyelitis.  He will     also have a follow-up appointment with Dr. Dorene Grebe on February 20     at 2:45 p.m. 2. Type 2 diabetes, poorly controlled.  Hemoglobin A1c on May 19, 2010 was 12.6 with CBGs in 400s.  The patient received Lantus and     SSI during hospital course, however, we had to switch his Lantus     to Humalog 75/25 because the patient's insurance would not pay for     Lantus as his outpatient medication.  We continued to check his CBG     t.i.d., which ranged between 43 to 491 and at discharge, his     glucose was 349.  Care management was also involved in his care to     help with medication assistant program.  We also checked his lipid     profile which shows a cholesterol of 161, triglyceride 105, HDL 54,     LDL 86. 3. History of polysubstance abuse.  The patient received NicoDerm     patch and also social work consult was pleased for substance abuse      counseling. 4. Hypertension, well controlled.  We will continue his home med of     lisinopril. 5. VTE prophylaxis.  Heparin 5000 units subcu 8 hours. 6. Anemia, most likely of chronic disease.  His hemoglobin remained     stable during the  hospitalization between 10 and 11, which is his     baseline.  DISCHARGE VITALS:  Temperature 98.3, blood pressure 169/88, pulse 82, respirations 20, O2 sat 99% on room air.  DISCHARGE LABORATORIES:  CBC:  WBC 7.7, hemoglobin 11.5, hematocrit 35.0, MCV 89.5, platelet 342.  Sodium 137, potassium 4.1, chloride 104, bicarb 25, BUN 14, creatinine 1.4, glucose 204, calcium 9.0.    ______________________________ Carrolyn Meiers, MD   ______________________________ Lacretia Leigh. Ninetta Lights, M.D.    MH/MEDQ  D:  05/24/2010  T:  05/25/2010  Job:  [id number]  cc:   G. Dorene Grebe, M.D.  Electronically Signed by Carrolyn Meiers MD on 06/16/2010 03:04:37 PM Electronically Signed by Johny Sax M.D. on 07/12/2010 04:05:56 PM

## 2010-07-13 ENCOUNTER — Encounter: Payer: Self-pay | Admitting: Licensed Clinical Social Worker

## 2010-07-13 NOTE — Telephone Encounter (Signed)
Chilon will schedule appointment 

## 2010-07-13 NOTE — Telephone Encounter (Signed)
Thank you for your follow-up.  It appears this issue has resolved and that he did receive his therapy while in prison.

## 2010-07-13 NOTE — Telephone Encounter (Signed)
Please schedule Mr. Bender for the next available, non-overbook appointment with Dr. Cena Benton.  Thank You.

## 2010-07-13 NOTE — Telephone Encounter (Signed)
Sending letter to patient home to contact social work.

## 2010-07-13 NOTE — Telephone Encounter (Signed)
I called Home health nurse again and she states she has made several unsuccessful attempts to get in touch with pt.  Last visit 3/15 from Home Health.   I called pt and talked with him today. He states he had been  Locked up in [place], but had received all this IV meds there. Pt removed PICC line himself on 3/31........  No complaints at this time.

## 2010-07-13 NOTE — Telephone Encounter (Signed)
See letter sent to patient.

## 2010-07-17 LAB — CBC
HCT: 33.9 % — ABNORMAL LOW (ref 39.0–52.0)
HCT: 29.9 % — ABNORMAL LOW (ref 39.0–52.0)
Hemoglobin: 11.1 g/dL — ABNORMAL LOW (ref 13.0–17.0)
Hemoglobin: 11.3 g/dL — ABNORMAL LOW (ref 13.0–17.0)
Hemoglobin: 9.9 g/dL — ABNORMAL LOW (ref 13.0–17.0)
MCHC: 32.8 g/dL (ref 30.0–36.0)
MCHC: 33.3 g/dL (ref 30.0–36.0)
MCV: 90.7 fL (ref 78.0–100.0)
RBC: 3.73 MIL/uL — ABNORMAL LOW (ref 4.22–5.81)
RBC: 3.29 MIL/uL — ABNORMAL LOW (ref 4.22–5.81)
RBC: 3.74 MIL/uL — ABNORMAL LOW (ref 4.22–5.81)
RDW: 14.5 % (ref 11.5–15.5)
RDW: 14.6 % (ref 11.5–15.5)
RDW: 14.9 % (ref 11.5–15.5)

## 2010-07-17 LAB — DIFFERENTIAL
Basophils Absolute: 0.2 10*3/uL — ABNORMAL HIGH (ref 0.0–0.1)
Eosinophils Relative: 0 % (ref 0–5)
Lymphocytes Relative: 6 % — ABNORMAL LOW (ref 12–46)
Monocytes Absolute: 0.8 10*3/uL (ref 0.1–1.0)
Monocytes Relative: 6 % (ref 3–12)
Neutro Abs: 10.4 10*3/uL — ABNORMAL HIGH (ref 1.7–7.7)

## 2010-07-17 LAB — RAPID URINE DRUG SCREEN, HOSP PERFORMED
Benzodiazepines: NOT DETECTED
Cocaine: NOT DETECTED
Opiates: POSITIVE — AB

## 2010-07-17 LAB — BASIC METABOLIC PANEL
CO2: 29 mEq/L (ref 19–32)
CO2: 24 mEq/L (ref 19–32)
Calcium: 8.3 mg/dL — ABNORMAL LOW (ref 8.4–10.5)
Calcium: 9.1 mg/dL (ref 8.4–10.5)
Chloride: 103 mEq/L (ref 96–112)
GFR calc Af Amer: >60 mL/min (ref >60)
GFR calc Af Amer: >60 mL/min (ref >60)
GFR calc non Af Amer: >60 mL/min (ref >60)
GFR calc non Af Amer: >60 mL/min (ref >60)
Glucose, Bld: 211 mg/dL — ABNORMAL HIGH (ref 70–99)
Glucose, Bld: 238 mg/dL — ABNORMAL HIGH (ref 70–99)
Glucose, Bld: 85 mg/dL (ref 70–99)
Potassium: 4.2 mEq/L (ref 3.5–5.1)
Potassium: 4.4 mEq/L (ref 3.5–5.1)
Sodium: 133 mEq/L — ABNORMAL LOW (ref 135–145)
Sodium: 134 mEq/L — ABNORMAL LOW (ref 135–145)
Sodium: 142 mEq/L (ref 135–145)

## 2010-07-17 LAB — GLUCOSE, CAPILLARY
Glucose-Capillary: 167 mg/dL — ABNORMAL HIGH (ref 70–99)
Glucose-Capillary: 251 mg/dL — ABNORMAL HIGH (ref 70–99)
Glucose-Capillary: 253 mg/dL — ABNORMAL HIGH (ref 70–99)
Glucose-Capillary: 262 mg/dL — ABNORMAL HIGH (ref 70–99)
Glucose-Capillary: 232 mg/dL — ABNORMAL HIGH (ref 70–99)
Glucose-Capillary: 234 mg/dL — ABNORMAL HIGH (ref 70–99)

## 2010-07-17 LAB — HEPATIC FUNCTION PANEL
Albumin: 3.4 g/dL — ABNORMAL LOW (ref 3.5–5.2)
Alkaline Phosphatase: 100 U/L (ref 39–117)
Total Protein: 7.1 g/dL (ref 6.0–8.3)

## 2010-07-17 LAB — ABO/RH: ABO/RH(D): O POS

## 2010-07-17 LAB — TYPE AND SCREEN
ABO/RH(D): O POS
Antibody Screen: NEGATIVE

## 2010-07-18 LAB — CBC
HCT: 25.5 % — ABNORMAL LOW (ref 39.0–52.0)
HCT: 27.7 % — ABNORMAL LOW (ref 39.0–52.0)
HCT: 28.1 % — ABNORMAL LOW (ref 39.0–52.0)
HCT: 28.1 % — ABNORMAL LOW (ref 39.0–52.0)
HCT: 35.4 % — ABNORMAL LOW (ref 39.0–52.0)
HCT: 36 % — ABNORMAL LOW (ref 39.0–52.0)
Hemoglobin: 10.7 g/dL — ABNORMAL LOW (ref 13.0–17.0)
Hemoglobin: 11.7 g/dL — ABNORMAL LOW (ref 13.0–17.0)
Hemoglobin: 11.9 g/dL — ABNORMAL LOW (ref 13.0–17.0)
Hemoglobin: 8.6 g/dL — ABNORMAL LOW (ref 13.0–17.0)
Hemoglobin: 9.1 g/dL — ABNORMAL LOW (ref 13.0–17.0)
Hemoglobin: 9.3 g/dL — ABNORMAL LOW (ref 13.0–17.0)
Hemoglobin: 9.6 g/dL — ABNORMAL LOW (ref 13.0–17.0)
MCHC: 33.1 g/dL (ref 30.0–36.0)
MCHC: 33.5 g/dL (ref 30.0–36.0)
MCHC: 33.5 g/dL (ref 30.0–36.0)
MCHC: 34.3 g/dL (ref 30.0–36.0)
MCV: 91.2 fL (ref 78.0–100.0)
MCV: 91.2 fL (ref 78.0–100.0)
MCV: 91.3 fL (ref 78.0–100.0)
MCV: 91.4 fL (ref 78.0–100.0)
MCV: 92 fL (ref 78.0–100.0)
MCV: 92.3 fL (ref 78.0–100.0)
Platelets: 548 10*3/uL — ABNORMAL HIGH (ref 150–400)
Platelets: 551 10*3/uL — ABNORMAL HIGH (ref 150–400)
Platelets: 764 10*3/uL — ABNORMAL HIGH (ref 150–400)
Platelets: 772 10*3/uL — ABNORMAL HIGH (ref 150–400)
RBC: 3.08 MIL/uL — ABNORMAL LOW (ref 4.22–5.81)
RBC: 3.08 MIL/uL — ABNORMAL LOW (ref 4.22–5.81)
RBC: 3.47 MIL/uL — ABNORMAL LOW (ref 4.22–5.81)
RBC: 3.87 MIL/uL — ABNORMAL LOW (ref 4.22–5.81)
RDW: 14.1 % (ref 11.5–15.5)
RDW: 14.1 % (ref 11.5–15.5)
RDW: 14.2 % (ref 11.5–15.5)
RDW: 14.2 % (ref 11.5–15.5)
RDW: 14.2 % (ref 11.5–15.5)
WBC: 10.4 10*3/uL (ref 4.0–10.5)
WBC: 8.1 10*3/uL (ref 4.0–10.5)
WBC: 9.5 10*3/uL (ref 4.0–10.5)

## 2010-07-18 LAB — BASIC METABOLIC PANEL
BUN: 11 mg/dL (ref 6–23)
BUN: 11 mg/dL (ref 6–23)
BUN: 13 mg/dL (ref 6–23)
CO2: 23 mEq/L (ref 19–32)
CO2: 23 mEq/L (ref 19–32)
CO2: 24 mEq/L (ref 19–32)
Calcium: 8.4 mg/dL (ref 8.4–10.5)
Calcium: 8.7 mg/dL (ref 8.4–10.5)
Chloride: 102 mEq/L (ref 96–112)
Chloride: 102 mEq/L (ref 96–112)
Chloride: 102 mEq/L (ref 96–112)
Chloride: 98 mEq/L (ref 96–112)
GFR calc Af Amer: >60 mL/min (ref >60)
GFR calc Af Amer: >60 mL/min (ref >60)
GFR calc Af Amer: >60 mL/min (ref >60)
GFR calc Af Amer: >60 mL/min (ref >60)
GFR calc non Af Amer: 52 mL/min — ABNORMAL LOW (ref >60)
GFR calc non Af Amer: >60 mL/min (ref >60)
GFR calc non Af Amer: >60 mL/min (ref >60)
GFR calc non Af Amer: >60 mL/min (ref >60)
Glucose, Bld: 266 mg/dL — ABNORMAL HIGH (ref 70–99)
Glucose, Bld: 282 mg/dL — ABNORMAL HIGH (ref 70–99)
Glucose, Bld: 303 mg/dL — ABNORMAL HIGH (ref 70–99)
Potassium: 4.1 mEq/L (ref 3.5–5.1)
Potassium: 4.2 mEq/L (ref 3.5–5.1)
Potassium: 4.3 mEq/L (ref 3.5–5.1)
Potassium: 4.7 mEq/L (ref 3.5–5.1)
Sodium: 132 mEq/L — ABNORMAL LOW (ref 135–145)
Sodium: 132 mEq/L — ABNORMAL LOW (ref 135–145)
Sodium: 133 mEq/L — ABNORMAL LOW (ref 135–145)
Sodium: 137 mEq/L (ref 135–145)
Sodium: 139 mEq/L (ref 135–145)

## 2010-07-18 LAB — URINALYSIS, ROUTINE W REFLEX MICROSCOPIC
Glucose, UA: >1000 mg/dL — AB
Leukocytes, UA: NEGATIVE
Nitrite: NEGATIVE
Protein, ur: 100 mg/dL — AB
Urobilinogen, UA: 1 mg/dL (ref 0.0–1.0)

## 2010-07-18 LAB — RAPID URINE DRUG SCREEN, HOSP PERFORMED
Amphetamines: NOT DETECTED
Tetrahydrocannabinol: 13.8 — AB

## 2010-07-18 LAB — GLUCOSE, CAPILLARY
Glucose-Capillary: 226 mg/dL — ABNORMAL HIGH (ref 70–99)
Glucose-Capillary: 231 mg/dL — ABNORMAL HIGH (ref 70–99)
Glucose-Capillary: 271 mg/dL — ABNORMAL HIGH (ref 70–99)
Glucose-Capillary: 298 mg/dL — ABNORMAL HIGH (ref 70–99)
Glucose-Capillary: 315 mg/dL — ABNORMAL HIGH (ref 70–99)
Glucose-Capillary: 323 mg/dL — ABNORMAL HIGH (ref 70–99)
Glucose-Capillary: 342 mg/dL — ABNORMAL HIGH (ref 70–99)
Glucose-Capillary: 377 mg/dL — ABNORMAL HIGH (ref 70–99)
Glucose-Capillary: 505 mg/dL (ref 70–99)

## 2010-07-18 LAB — IRON AND TIBC
Iron: 36 ug/dL — ABNORMAL LOW (ref 42–135)
TIBC: 146 ug/dL — ABNORMAL LOW (ref 215–435)
UIBC: 110 ug/dL

## 2010-07-18 LAB — LIPID PANEL
HDL: 47 mg/dL (ref >39)
LDL Cholesterol: 82 mg/dL (ref 0–99)
Total CHOL/HDL Ratio: 3 RATIO
VLDL: 11 mg/dL (ref 0–40)

## 2010-07-18 LAB — APTT
aPTT: 38 seconds — ABNORMAL HIGH (ref 24–37)
aPTT: 43 seconds — ABNORMAL HIGH (ref 24–37)

## 2010-07-18 LAB — VITAMIN B12: Vitamin B-12: 495 pg/mL (ref 211–911)

## 2010-07-18 LAB — CULTURE, BLOOD (ROUTINE X 2): Culture: NO GROWTH

## 2010-07-18 LAB — COMPREHENSIVE METABOLIC PANEL
Albumin: 2.8 g/dL — ABNORMAL LOW (ref 3.5–5.2)
Alkaline Phosphatase: 101 U/L (ref 39–117)
BUN: 12 mg/dL (ref 6–23)
Creatinine, Ser: 1.51 mg/dL — ABNORMAL HIGH (ref 0.4–1.5)
Glucose, Bld: 340 mg/dL — ABNORMAL HIGH (ref 70–99)
Total Bilirubin: 0.6 mg/dL (ref 0.3–1.2)
Total Protein: 7.8 g/dL (ref 6.0–8.3)

## 2010-07-18 LAB — CROSSMATCH: Antibody Screen: NEGATIVE

## 2010-07-18 LAB — PROTIME-INR
INR: 1.2 (ref 0.00–1.49)
Prothrombin Time: 15.9 seconds — ABNORMAL HIGH (ref 11.6–15.2)
Prothrombin Time: 16.8 seconds — ABNORMAL HIGH (ref 11.6–15.2)

## 2010-07-18 LAB — RETICULOCYTES: RBC.: 2.85 MIL/uL — ABNORMAL LOW (ref 4.22–5.81)

## 2010-07-18 LAB — DIFFERENTIAL
Basophils Absolute: 0 10*3/uL (ref 0.0–0.1)
Basophils Relative: 0 % (ref 0–1)
Lymphocytes Relative: 10 % — ABNORMAL LOW (ref 12–46)
Monocytes Absolute: 1.1 10*3/uL — ABNORMAL HIGH (ref 0.1–1.0)
Monocytes Relative: 12 % (ref 3–12)
Neutro Abs: 7.2 10*3/uL (ref 1.7–7.7)
Neutrophils Relative %: 77 % (ref 43–77)

## 2010-07-18 LAB — URINE CULTURE: Culture: NO GROWTH

## 2010-07-18 LAB — URINE MICROSCOPIC-ADD ON

## 2010-07-18 LAB — HEMOGLOBIN A1C: Hgb A1c MFr Bld: 10.1 % — ABNORMAL HIGH (ref 4.6–6.1)

## 2010-07-19 LAB — GLUCOSE, CAPILLARY
Glucose-Capillary: 145 mg/dL — ABNORMAL HIGH (ref 70–99)
Glucose-Capillary: 242 mg/dL — ABNORMAL HIGH (ref 70–99)
Glucose-Capillary: 259 mg/dL — ABNORMAL HIGH (ref 70–99)

## 2010-07-19 LAB — BASIC METABOLIC PANEL
BUN: 11 mg/dL (ref 6–23)
CO2: 28 mEq/L (ref 19–32)
Calcium: 8.4 mg/dL (ref 8.4–10.5)
Calcium: 8.8 mg/dL (ref 8.4–10.5)
Creatinine, Ser: 1.19 mg/dL (ref 0.4–1.5)
Creatinine, Ser: 1.44 mg/dL (ref 0.4–1.5)
GFR calc Af Amer: >60 mL/min (ref >60)
GFR calc non Af Amer: 54 mL/min — ABNORMAL LOW (ref >60)
Glucose, Bld: 265 mg/dL — ABNORMAL HIGH (ref 70–99)
Sodium: 132 mEq/L — ABNORMAL LOW (ref 135–145)

## 2010-07-19 LAB — CBC
MCHC: 33.8 g/dL (ref 30.0–36.0)
Platelets: 335 10*3/uL (ref 150–400)
RBC: 4.2 MIL/uL — ABNORMAL LOW (ref 4.22–5.81)
RDW: 15.4 % (ref 11.5–15.5)

## 2010-07-20 LAB — URINALYSIS, ROUTINE W REFLEX MICROSCOPIC
Glucose, UA: 500 mg/dL — AB
Ketones, ur: 40 mg/dL — AB
Protein, ur: >300 mg/dL — AB

## 2010-07-20 LAB — APTT: aPTT: 27 seconds (ref 24–37)

## 2010-07-20 LAB — URINE MICROSCOPIC-ADD ON

## 2010-07-20 LAB — BASIC METABOLIC PANEL
BUN: 13 mg/dL (ref 6–23)
CO2: 26 mEq/L (ref 19–32)
Calcium: 8.1 mg/dL — ABNORMAL LOW (ref 8.4–10.5)
Creatinine, Ser: 1.41 mg/dL (ref 0.4–1.5)
GFR calc Af Amer: >60 mL/min (ref >60)
GFR calc non Af Amer: 55 mL/min — ABNORMAL LOW (ref >60)
GFR calc non Af Amer: >60 mL/min (ref >60)
Glucose, Bld: 209 mg/dL — ABNORMAL HIGH (ref 70–99)
Sodium: 140 mEq/L (ref 135–145)

## 2010-07-20 LAB — ETHANOL: Alcohol, Ethyl (B): <5 mg/dL (ref 0–10)

## 2010-07-20 LAB — CBC
Hemoglobin: 12.8 g/dL — ABNORMAL LOW (ref 13.0–17.0)
Hemoglobin: 15.6 g/dL (ref 13.0–17.0)
Platelets: 435 10*3/uL — ABNORMAL HIGH (ref 150–400)
RBC: 4.17 MIL/uL — ABNORMAL LOW (ref 4.22–5.81)
RDW: 15.7 % — ABNORMAL HIGH (ref 11.5–15.5)
WBC: 15 10*3/uL — ABNORMAL HIGH (ref 4.0–10.5)

## 2010-07-20 LAB — HEPATIC FUNCTION PANEL
Alkaline Phosphatase: 131 U/L — ABNORMAL HIGH (ref 39–117)
Bilirubin, Direct: <0.1 mg/dL (ref 0.0–0.3)
Total Protein: 8.9 g/dL — ABNORMAL HIGH (ref 6.0–8.3)

## 2010-07-20 LAB — PROTIME-INR: INR: 1.1 (ref 0.00–1.49)

## 2010-07-20 LAB — PHOSPHORUS: Phosphorus: 3.4 mg/dL (ref 2.3–4.6)

## 2010-07-20 LAB — TYPE AND SCREEN: Antibody Screen: NEGATIVE

## 2010-07-20 LAB — MAGNESIUM: Magnesium: 2.2 mg/dL (ref 1.5–2.5)

## 2010-08-23 NOTE — Discharge Summary (Signed)
[name], [name]                 ACCOUNT NO.:  [id number]   MEDICAL RECORD NO.:  [id number]          PATIENT TYPE:  INP   LOCATION:  6705                         FACILITY:  [facility]   PHYSICIAN:  Mick Sell, MD DATE OF BIRTH:  10-07-1965   DATE OF ADMISSION:  09/10/2008  DATE OF DISCHARGE:  09/16/2008                               DISCHARGE SUMMARY   DISCHARGE DIAGNOSES:  1. Left hydronephrosis with 2 L of pus drained via percutaneous      nephrostomy tube, nephrectomy planned as an outpatient.  2. Anemia secondary to acute blood loss, due to drainage from      nephrostomy tube, hemoglobin stable at 9 as of discharge.  3. 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  4. Diabetes mellitus type 1.  5. Hypertension.  6. Hyperlipidemia.  7. Polysubstance abuse including cocaine, marijuana, and alcohol.  8. History of alcoholic pancreatitis, May 2010.  9. History of ulcerative esophagitis, severe, complicated with upper      gastrointestinal bleed in 2007.  10.History of Mallory-Weiss tear in April 2009.  11.History of prolonged QT interval.  12.Inadequate material resources.  13.Chronic renal insufficiency.   DISCHARGE MEDICATIONS:  1. Lisinopril-hydrochlorothiazide 20-25 mg p.o. daily.  2. Protonix 40 mg p.o. b.i.d.  3. Diabetic testing strips.  4. Glucose meter.  5. Testing supplies.  6. Neurontin 100 mg p.o. t.i.d.  7. NovoLog 70/30 insulin, inject 45 units in the morning and 25 units      before the evening meal.  8. Ciprofloxacin 500 mg p.o. b.i.d. (the prescription for      ciprofloxacin was [facility] to the [facility]).   DISCHARGE CONDITION AND FOLLOWUP:  The patient was discharged in stable  condition, his abdominal pain having improved following decompression of  the pyonephrosis via percutaneous nephrostomy tube.  His hemoglobin was  stable with the last 2 CBCs showing a hemoglobin of 9.1 followed by a  hemoglobin of 9.3.  The patient is to continue taking ciprofloxacin p.o.  for his pyonephrosis.  He is scheduled to have Home Health care with  daily dressing changes and recording of output from the percutaneous  nephrostomy drain.  This is arranged with [facility].  He is to  follow up with Dr. Reynold Bowen in the [facility] on September 22, 2008, at 3:30 p.m.  At this visit, items to an address include:  1. Verify that the patient is afebrile, has not had significant      bleeding and that percutaneous nephrostomy tube is looking okay.  2. Verify that the patient is taking his medications including Cipro      as directed.  3. The patient is to follow up with Urology as noted below and please      ensure that there are no problems regarding this.   The patient is to follow up with Dr. Heloise Purpura of [facility],  phone number [phone], on October 02, 2008, at 2:30 p.m.  This visit is to  arrange for the patient to  have a left nephrectomy.  He will need  appropriate followup regarding his pyonephrosis and arrangements for the  nephrectomy.   The Home Health nurse is to perform a CBC on Monday, September 21, 2008, and  call the results to Dr. Onalee Hua on his pager to ensure that the  patient's anemia does not progress.   PROCEDURES:  1. Acute abdominal series dated, September 10, 2008, showed increased fecal      material throughout the colon without evidence for impaction and no      other acute or significant findings.  2. Renal ultrasound dated September 11, 2008, demonstrated massive      progressive hydronephrosis on the left compared with prior      examinations (prior abdominal ultrasound on August 07, 2008).  The      prior examination had demonstrated chronic UPJ stenosis.  The      increased hydronephrosis suggest superimposed acute obstruction or      superinfection.  The right kidney appeared normal.  3. CT abdomen without contrast dated September 11, 2008, demonstrated       interval massive left hydronephrosis without urethral dilation or      visible calculi suggesting complete UPJ obstruction.  Interval left      mild periaortic adenopathy most pronounced at the level of the left      kidney compatible with infection and reactive lymphadenopathy      versus a neoplastic process, stable left adrenal hyperplasia and      right adrenal calcification, possibly due to prior hemorrhage.      There was minimal free peritoneal fluid and a normal-appearing      appendix and no urinary tract calculi seen.  4. Ultrasound-guided puncture of the left renal collecting system and      left percutaneous nephrostomy tube placement on September 12, 2008, was      performed by Dr. Irish Lack of Interventional Radiology.      Approximately 2 L of purulent fluid were drained from the kidney.  5. CT abdomen and pelvis without contrast dated September 15, 2008,      demonstrated interval decompression of massive left hydronephrosis      with nephrostomy tube.  Nephrostomy tube appears to be in an upper      pole calix and there is mild residual hydronephrosis.  There was      mild left periaortic adenopathy unchanged and no acute abnormality      in the pelvis.   CONSULTATIONS:  1. Dr. Sherron Monday of Urology was consulted.  2. Dr. Irish Lack of Interventional Radiology was consulted.   BRIEF ADMITTING HISTORY AND PHYSICAL:  For complete details, please see  the paper chart, but in brief, the patient is a 45 year old male with  past medical history detailed above who presented with left lower  quadrant abdominal pain described as sharp, intermittent, 10/10  intensity, lasting approximately half an hour, worse with food.  He had  no bowel movement since the last 6 days prior to admission, denied  vomiting, hematochezia, melena, dysuria.  He had some nausea but no  vomiting.  He also had associated back pain.  Temperature 99.0, blood  pressure 151/92, pulse 115-106, respirations 19,  oxygen saturation 97%  on room air.  The patient was in mild distress due to pain.  Lungs were  clear to auscultation bilaterally.  Heart had regular rate and rhythm.  No murmurs or gallops.  Bowel sounds were present.  Abdomen was soft,  very tender in the left lower quadrant.  No rigidity.  No right lower  quadrant pain.  No guarding.  No epigastric tenderness.  Neuro exam was  nonfocal.  Sodium 131, potassium 4.2, chloride 97, bicarb 24, BUN 12,  creatinine 1.51, glucose 340, white blood count 9.4, hemoglobin 11.9,  platelets 551, lipase 13.  LFTs were significant only for albumin 2.8.  Urinalysis showed 15 ketones, nitrate negative, blood negative, wbc 0-2.  FOBT was negative.  Chest x-ray showed increased fecal material  throughout the colon without evidence of impaction.   HOSPITAL COURSE:  Massive left pyonephrosis:  As the patient's abdominal  pain did not respond to laxatives and following morning he was noted to  have a large palpable kidney on exam, ultrasound was obtained which  showed the massive left kidney and this was decompressed with  percutaneous nephrostomy tube per interventional radiology.  Dr.  Sherron Monday of Urology was consulted who recommended drainage via  percutaneous nephrostomy as well as continued antibiotic therapy.  As  the patient stabilized, the plan is for outpatient elective nephrectomy  in a few weeks.  The patient was initially covered with Cipro, and  culture of the aspirate from the left kidney while the patient was  taking Cipro showed no growth.  He was switched to vancomycin and Zosyn  following the placement of the percutaneous nephrostomy tubes.  He was  febrile several hours following the placement of the nephrostomy drain  and blood cultures obtained at this time are still preliminary though  they are negative to date.  The patient's hemoglobin did drop from 11.9  on admission as low as 8.6, and this was most likely due to acute blood  loss  due to bloody drainage from the nephrostomy tube.  However, the  drainage from the nephrostomy tube slowed, and the patient's hemoglobin  on the day of discharge was 9.3 and was 9.1 on the day prior to  discharge.  He was discharged in stable condition with the followup  noted above.   DISCHARGE LABORATORY DATA AND VITALS:  Temperature 98.7, pulse 103,  respirations 20, blood pressure 149/92, oxygen saturation 99% on room  air.  White blood count 10.4, hemoglobin 9.3, platelets 764.  Sodium  137, potassium 4.1, chloride 99, bicarb 29, glucose 315, BUN 12,  creatinine 1.18, calcium 8.9.  Please note that HIV was checked during  this admission and was negative.   PENDING LABORATORY DATA:  As at the time of this dictation, blood  cultures are negative to date, but the final report is pending.      [name], MD  Electronically Signed      Mick Sell, MD  Electronically Signed    PN/MEDQ  D:  09/18/2008  T:  09/19/2008  Job:  [id number]   cc:   Martina Sinner, MD  Heloise Purpura, MD

## 2010-08-23 NOTE — Op Note (Signed)
NAMEKRISTAN, [name]                 ACCOUNT NO.:  [id number]   MEDICAL RECORD NO.:  [id number]          PATIENT TYPE:  INP   LOCATION:  5015                         FACILITY:  [facility]   PHYSICIAN:  Madlyn Frankel. Charlann Boxer, M.D.  DATE OF BIRTH:  [birth year]   DATE OF PROCEDURE:  03/25/2008  DATE OF DISCHARGE:                               OPERATIVE REPORT   PREOPERATIVE DIAGNOSIS:  Infected left foot ulcer.   POSTOPERATIVE DIAGNOSES/FINDINGS:  The patient appeared to have a fairly  superficial left foot ulcer between the fourth and fifth toes and  metatarsal region with an excessively thickened and hardened callus over  this area.  Preoperative evaluation with MRI revealed no evidence of  osteomyelitis.  There do not appear to be tracking sinuses to the area  of bone.   PROCEDURE:  Incision and debridement of the left foot with extensive  removal of hardened callus, thickened callus as well as debridement of  subcutaneous tissue that appeared infectious in nature.   SURGEON:  Madlyn Frankel. Charlann Boxer, MD   ASSISTANT:  None.   ANESTHESIA:  General.   ESTIMATED BLOOD LOSS:  None.   TOURNIQUET TIME:  90 minutes at 250 mmHg.   DRAINS:  None.   COMPLICATIONS:  None.   INDICATIONS FOR PROCEDURE:  [name] is a 45 year old male with reported  history of diabetes.  He is noncompliant with an ill-managed diabetes.  He had an A1c recently of 9.2.   He presented to the hospital yesterday with foul-smelling drainage from  his left toe.  We were consulted late in the evening.  Upon evaluation,  he was afebrile with a white count of 11.9.  He had foul-smelling  drainage coming from the fourth and fifth toe region.  Plain x-rays were  equivocal for evidence of bone involvement.  MRI was ordered revealing  soft tissue swelling, no evidence of an osteomyelitis.   At this point, the consent was obtained for an I&D of the foot.  No  evidence of bony involvement.  No need for ray amputation.   PROCEDURE IN  DETAIL:  The patient was brought to the operative theater.  Once adequate anesthesia, preoperative antibiotics had already been  administered.  He has already been prescribed Unasyn and vancomycin.   His left lower extremity was placed in the thigh tourniquet.  The left  lower extremity was then prepped and draped in sterile fashion.   Basically, I was able to incise and excise the very thickened callus and  skin around this area.  Once I debrided those back, it was down to  healthy tissue, I debrided with a small rongeur and pick up of any other  necrotic-appearing tissue.   There appeared to be no palpable sinuses or any penetration deep.  There  were no tracking sinuses anywhere.  Once I debrided the skin back to  stable level, I irrigated it with 1 L of normal saline solution.   I then placed a wet-to-dry dressing with dry gauze between the second-  third, third-fourth, and fourth-fifth web spaces.  I then wrapped it  into  a sterile dressing and the tourniquet was let down after 90  minutes.   He was awoken from anesthesia and brought to the recovery room in stable  condition.   Postoperatively, no wound cultures were obtained.  There was no large  amount of purulence.  I did not even wipe the wound as I am sure  certainly there would be polymicrobial in addition to the fact that he  is already on antibiotics.  Broad-spectrum coverage are to be obtained  through perhaps Infectious Disease consultation versus empiric  treatment.  He should be seen and evaluated by the wound care nurse for  wound management, wet-to-dry dressings is most likely with home health.   PLANS AT DISCHARGE:  He will be weightbearing as tolerated.      Madlyn Frankel Charlann Boxer, M.D.  Electronically Signed     MDO/MEDQ  D:  03/25/2008  T:  03/26/2008  Job:  [id number]

## 2010-08-23 NOTE — Op Note (Signed)
[name], [name]                 ACCOUNT NO.:  [id number]   MEDICAL RECORD NO.:  [id number]          PATIENT TYPE:  INP   LOCATION:  3301                         FACILITY:  [facility]   PHYSICIAN:  Petra Kuba, M.D.    DATE OF BIRTH:  Jul 15, 1965   DATE OF PROCEDURE:  DATE OF DISCHARGE:                               OPERATIVE REPORT   PROCEDURE:  Esophagogastroduodenoscopy.   INDICATION:  Upper GI bleeding in patient with significant history of  esophagitis and diabetes, drug abuse, alcohol abuse.  Consent was signed  after risks, benefits, methods, options thoroughly discussed multiple  times in the past and before any sedation by myself, the PA and Dr.  Bosie Clos.   MEDICINES USED:  Fentanyl 60 mcg, Versed 5 mg, Phenergan 12.5 mg.   PROCEDURE:  The video endoscope was inserted by direct vision.  The  proximal and mid esophagus was normal.  The distal esophagus with  significant ulcerations, a few clots and inflammation.  He did have a  small hiatal hernia.  The scope passed into the stomach and  approximately 700 mL of old blood was suctioned.  We could not suction  all of the blood.  We did advance through a normal antrum, normal  pylorus into a normal duodenal bulb, and over to the celiac to a normal  second portion of the duodenum.  Stagnant duodenal fluid was seen in the  distance but no signs of active bleeding distally.  The scope was slowly  withdrawn back to the bulb.  A good look there rule out ulcers in that  location.  The scope was withdrawn back to the stomach and retroflexed.  High in the cardia you could see some ulcerations on retroflexion and  the small hiatal hernia.  He had some difficulty holding air so complete  evaluation of the stomach was difficulty.  Also we did not clear the  entire dependent portion of the proximal greater curve so lesions could  have been missed under that but no signs of active bleeding were seen.  The stomach was evaluated on straight  and retroflexed visualization  without additional findings.  We did try to suction as much air and  water and debris as we could.  We did clog the scope occasionally and  had to wash a little bit.  The scope was then slowly withdrawn.  Again,  a good look at the esophagus confirmed the above findings.  The scope  was removed.  The patient tolerated the procedure well.  There was no  obvious immediate complication.   ENDOSCOPIC DIAGNOSES:  1. Small hiatal hernia with significant distal esophageal ulcerations      and esophagitis.  2. Old blood in the stomach about 700 mL suctioned, but unable to see      underneath the old blood but no signs of active bleeding.  3. Otherwise normal esophagogastroduodenoscopy with signs of      gastroparesis and minimal duodenal motility.   PLAN:  B.i.d. pump inhibitors.  We will start Reglan.  Clear liquids  today.  Can slowly advance diet  if doing better tomorrow.  Expect his  hemoglobin to drop as his BUN drops with rehydration.  We will go ahead  and give him one more unit of blood for his hemoglobin of 8.           ______________________________  Petra Kuba, M.D.     MEM/MEDQ  D:  02/28/2007  T:  02/28/2007  Job:  [id number]   cc:   Petra Kuba, M.D.  Acey Lav, MD

## 2010-08-23 NOTE — Op Note (Signed)
NAMEEHAN, FREAS                 ACCOUNT NO.:  [id number]   MEDICAL RECORD NO.:  [id number]          PATIENT TYPE:  INP   LOCATION:  1427                         FACILITY:  [facility]   PHYSICIAN:  Heloise Purpura, MD      DATE OF BIRTH:  02/12/1966   DATE OF PROCEDURE:  10/22/2008  DATE OF DISCHARGE:                               OPERATIVE REPORT   PREOPERATIVE DIAGNOSES:  1. Poorly functioning left kidney.  2. Left ureteropelvic junction obstruction.  3. History of pyonephrosis, status post percutaneous drainage.   POSTOPERATIVE DIAGNOSES:  1. Poorly functioning left kidney.  2. Left ureteropelvic junction obstruction.  3. History of pyonephrosis, status post percutaneous drainage.   PROCEDURE:  Left laparoscopic nephrectomy.   SURGEON:  Dr. Heloise Purpura   ASSISTANT:  Dr. Edward Qualia   ANESTHESIA:  General.   COMPLICATIONS:  None.   ESTIMATED BLOOD LOSS:  200 mL.   SPECIMEN:  Left kidney and proximal ureter.   DISPOSITION:  Specimen to pathology.   DRAINS:  1. A #15 left renal fossa drain.  2. A 16-French Foley catheter.   INDICATIONS:  Mr. Biller is a 45 year old gentleman who recently presented  to the emergency department with abdominal pain.  He was admitted to  [facility] and seen in consultation by Dr. Alfredo Martinez.  The cause of his abdominal pain was determined to be left-sided  hydronephrosis with pyonephrosis.  He underwent percutaneous drainage  during his hospitalization and his pain subsequently improved.  He has  been maintained on oral antibiotic therapy over the past 6 weeks and we  have discussed options for further management.  I have recommended and  he does wish to proceed with a left nephrectomy considering the minimal  function of his left kidney.  After discussion regarding the potential  risks, complications, and alternative treatment options, he elected to  proceed with the above procedure.  Informed consent was obtained.   DESCRIPTION OF PROCEDURE:  The patient was taken to the operating room  and a general anesthetic was administered.  He was given preoperative  antibiotics, placed in the left modified flank position, and prepped and  draped in the usual sterile fashion.  Next, a preoperative timeout was  performed.  A site was selected just above the umbilicus in the midline  for placement of the camera port.  This was placed using a standard open  Hassan technique.  This allowed entry into the peritoneal cavity under  direct vision and without difficulty.  Zero Vicryl holding sutures were  then placed in the abdominal wall fascia and the 12-mm Hassan cannula  was placed.  Inspection with the 30 degrees lens after establishment of  a pneumoperitoneum revealed no evidence for any intra-abdominal injuries  or other abnormalities.  The remaining ports were then placed.  A 12 mm  port was placed in the left lower quadrant and a 5 mm port was placed in  the left upper quadrant.  An additional 5 mm port was then placed in the  far left lateral abdominal wall.  All ports were placed  under direct  vision and without difficulty.  Using the harmonic scalpel, the white  line of Toldt was incised along the length of the descending colon  allowing the colon be mobilized medially and the space between the  mesocolon and the anterior layer of Gerota's fascia to be developed.  There was noted to be a very large decompressed left renal pelvis.  The  overlying mesocolon was severely adherent to the renal pelvis with a  plane between the renal pelvis and the mesocolon difficult to develop  due to previous inflammation.  The ureter was able to be identified  inferiorly and the space between the ureter and psoas was eventually  developed allowing the ureter to be lifted anteriorly off the psoas.  Dissection then proceeded superiorly.  During the dissection of the  renal pelvis away from the mesocolon there was noted to be a  mesenteric  window created due to the densely adherent mesocolon.  The renal hilum  was involved in an intense desmoplastic reaction.  However, the renal  artery and renal vein were able to be identified and separately isolated  with right-angle dissection.  They were then divided after ligation with  multiple Hem-o-lok clips.  During this dissection, there was noted to be  arterial bleeding from what appeared to be a small accessory renal  artery.  This was able to be controlled and secured with a Hem-o-lok  clip.  There was noted to be an inadvertent entry into the large renal  pelvis during the remainder of the dissection.  However, there did not  appear to be any gross purulent spillage.  This did help to define the  outline of the renal pelvis by inspection from the inner aspect of the  renal pelvis.  The remainder of the renal pelvis and the upper pole of  the kidney were able to be dissected away from the spleen with these  attachments taken down with a combination of harmonic scalpel,  ultrasonic energy and Hem-o-lok clips.  The ureter was then divided  between multiple Hem-o-lok clips and the lateral attachments and  posterior attachments to the kidney were taken down with the harmonic  scalpel allowing the kidney specimen to be freed.  The left renal fossa  was then copiously irrigated.  The adrenal bed was identified and for  security purposes, a small piece of Surgicel was placed over the adrenal  bed.  However, hemostasis did appear to be excellent at this point.  Attention then returned to the mesenteric window.  This was closed with  Hem-o-lok clips.  The colon was replaced back into its normal anatomic  position.  A #15 Blake drain was brought through the lateral 5 mm port  site and positioned into the left renal fossa.  It was secured to the  skin with a nylon suture.  The 12 mm left lower quadrant port was then  closed with a 0 Vicryl suture placed with the aid of the  suture passer  device laparoscopically.  Before this was tied down, the camera was  placed through this port and the kidney specimen was placed into the  EndoCatch II bag which was placed through the original camera port site  incision.  The remaining ports were then removed under direct vision and  the pneumoperitoneum was expelled.  The left lower quadrant 12-mm port  site was then closed with the previously placed 0 Vicryl suture.  The  camera port incision was then slightly extended allowing the  kidney  specimen to be removed intact within the EndoCatch II bag.  This fascial  opening was then closed with a running #1 PDS suture.  All port sites  were injected with quarter-percent Marcaine and reapproximated at the  skin level with staples.  The drain was secured with a nylon suture and  placed to bulb drainage.  The skin was then reapproximated with 4-0  Monocryl subcuticular closures and Dermabond.  The patient appeared to  tolerate the procedure well and without complications.  He was able to  be awakened and transferred to the recovery unit in satisfactory  condition.     Heloise Purpura, MD  Electronically Signed    LB/MEDQ  D:  10/22/2008  T:  10/23/2008  Job:  [id number]
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
## 2010-08-23 NOTE — Consult Note (Signed)
NAMEBENJY, KANA NO.:  [id number]   MEDICAL RECORD NO.:  [id number]          PATIENT TYPE:  INP   LOCATION:  3301                         FACILITY:  [facility]   PHYSICIAN:  Shirley Friar, MDDATE OF BIRTH:  11/09/1965   DATE OF CONSULTATION:  02/28/2007  DATE OF DISCHARGE:                                 CONSULTATION   REQUESTING PHYSICIAN:  Acey Lav, M.D., Medical Teaching Service   REASON FOR CONSULTATION:  GI bleed.   HISTORY OF PRESENT ILLNESS:  Mr. Norgaard is an unassigned 45 year old black  male with history of severe erosive esophagitis seen on upper endoscopy  in 2007 by Dr. Christella Hartigan, who comes in with several days of coffee-ground  emesis and one day of melena.  He was in the ER yesterday complaining of  vomiting and was treated and released and was found to have a hemoglobin  12.2 at that time.  He comes in today with the above mentioned problems  and his hemoglobin 6.8 and his blood pressure was 90/60 with a heart  rate of 102 upon arrival.  He denies any NSAIDs, but endorses drinking a  bottle of wine yesterday and doing cocaine.  He was just released from  jail 2 days ago.  He does describe a burning abdominal pain per  admission note, but he denies any right now.  He denies any  hematochezia.  Denies any bright red blood with his vomiting.  He has a  history of diabetic ketoacidosis and chronic pyelonephritis and has a  glucose greater than 700 at this time.   PAST MEDICAL HISTORY:  1. Type 1 diabetes.  2. History of diabetic ketoacidosis, most recently in August 2008.  3. Hypertension.  4. Chronic pyelonephritis.  5. History of left pyeloureteral junction obstruction.  6. Hyperlipidemia.  7. Polysubstance abuse.  8. Chronic renal insufficiency.   MEDICATIONS:  Novolin, insulin, lisinopril, Neurontin, Protonix.   FAMILY HISTORY:  Noncontributory.   SOCIAL HISTORY:  Positive cocaine use, positive alcohol, positive  marijuana,  positive tobacco.   ALLERGIES:  NO KNOWN DRUG ALLERGIES.   REVIEW OF SYSTEMS:  Negative, except as stated above.   PHYSICAL EXAMINATION:  VITAL SIGNS:  Temperature 97.5, pulse 108, blood  pressure 104/69, O2 saturation 100% on room air.  GENERAL:  Alert, in no acute distress.  ABDOMEN:  Soft, nontender, nondistended.  Positive bowel sounds.  RECTAL:  Black stool per ER staff.   LABORATORY DATA:  Hemoglobin 6.8, MCV 85, white blood count 13.8,  platelet count 236.  Potassium 4.6, glucose 770, BUN 70, creatinine 1.7,  total bili 0.8, ALP 56, AST 17, ALT 17, albumin 2.5, lipase 18, alkaline  phosphatase less than 5.   IMPRESSION:  A 45 year old black male presenting with upper GI bleed  likely due to peptic ulcer disease versus hemorrhagic gastritis or  esophagitis.  I do not think this is varices, AVM, or Dieulafoy lesion.   RECOMMENDATIONS:  My recommendation would be to do aggressive volume  resuscitation with fluids and blood transfusions.  Agree with continuous  IV Protonix drip.  Keep  patient n.p.o.  We will plan to do an upper  endoscopy in the a.m.  I have discussed the risk and benefits of the  procedure with the patient and he agrees to proceed.      Shirley Friar, MD  Electronically Signed     VCS/MEDQ  D:  02/28/2007  T:  02/28/2007  Job:  [id number]

## 2010-08-23 NOTE — Discharge Summary (Signed)
[name], DISMORE NO.:  [id number]   MEDICAL RECORD NO.:  [id number]          PATIENT TYPE:  INP   LOCATION:  5506                         FACILITY:  [facility]   PHYSICIAN:  Manning Charity, MD     DATE OF BIRTH:  23-Jun-1965   DATE OF ADMISSION:  01/14/2008  DATE OF DISCHARGE:  01/16/2008                               DISCHARGE SUMMARY   DISCHARGE DIAGNOSES:  1. Hyperglycemia, resolved.  2. History of nausea and vomiting with hematemesis, resolved.  3. Diabetes type 1 with an A1c of 11.4 in August 2009.  4. History of a Mallory-Weiss tear in April 2009.  5. Gastroparesis.  6. Hypertension.  7. Hyperthyroidism.  8. Chronic renal insufficiency with baseline creatinine of 1.3-1.4.  9. Polysubstance abuse including cannabis and cocaine.  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  11.Gastroesophageal reflux disease.   DISCHARGE MEDICATIONS:  1. NovoLog 70/30 insulin inject 40 units each morning and 20 units      each night.  2. Metformin 500 mg p.o. b.i.d.  3. Lisinopril 5 mg p.o. daily.  4. Protonix 40 mg p.o. b.i.d.  5. Diphenhydramine 50 mg p.o. up to twice a day as needed for itching.  6. Flexeril 5 mg p.o. nightly p.r.n. back pain.  7. Potassium chloride 20 mEq by mouth daily for 3 days.   The patient was provided with 2 bottles of 70/30 insulin as well as a 3-  day supply of metformin, lisinopril, Protonix, and potassium chloride.   DISCHARGE CONDITION AND FOLLOWUP:  The patient was discharged in stable  condition after his hyperglycemia, nausea, and vomiting had all  resolved.  He is to follow up at the outpatient clinic for labs where  they will check a BMET and a CBC on Monday, January 20, 2008, at 11:30  a.m.  He has an appointment to see Dr. Elvera Lennox in the Outpatient Clinic  on February 05, 2008, at 2 p.m., and at this time a BMET and CBC will be  checked to follow up on his potassium.   PROCEDURES:  On January 14, 2008, abdominal x-rays showed benign-  appearing abdomen and chest.   CONSULTATIONS:  None.   BRIEF ADMITTING HISTORY AND PHYSICAL:  [name] normally gets his  insulin and [facility]tonix [facility], but ran out of these  several days prior to admission and subsequently, developed nausea and  vomiting starting approximately 5 days prior to admission.  He vomited  multiple times every day mostly bringing up watery material and food  particles with occasional streaks of blackish blood.  He had burning  pain in both upper quadrants, which felt similar to his GERD  exacerbations.  He denied fevers, weight loss, cough, shortness of  breath, or chest pain.  He had no history of eating anything usual prior  to these symptoms.  No history of travel.  The stool had been loose and  watery for 2 days, but without any blood.   PHYSICAL EXAMINATION:  VITAL SIGNS:  Temperature 97.5, blood pressure  186/105, pulse 95, respirations 18, and oxygen saturation 100% on room  air.  GENERAL:  He was in no acute distress though he was occasionally  retching.  HEENT:  Sclerae were anicteric.  LUNGS:  Clear to auscultation bilaterally.  HEART:  Regular rate and rhythm.  No murmurs, rubs or gallops.  ABDOMEN:  Mild bilateral upper quadrant tenderness.  His abdomen was not  distended.  Bowel sounds were present and there is no organomegaly.  NEUROLOGIC:  He was alert and oriented without sensory or motor  deficits.   LABORATORY DATA:  Sodium 132, potassium 4.5, chloride 94, bicarbonate  23, BUN 14, creatinine 1.4, and glucose 366 for an anion gap of 15.  His  white count was 9.3, hemoglobin 14.1, and platelets 350.  Urinalysis was  significant for ketones 40 and proteins 30.  His CBG showed a glucose of  463.   HOSPITAL COURSE:  1. Hyperglycemia.  The patient was placed on an insulin drip in the      emergency department with a rapid correction of his hyperglycemia.      He was then transitioned to his home regimen of 70/30,  although      this had to be adjusted because at first he was not able to eat      very well.  Subsequently, his appetite returned, and he was able to      tolerate food, and he was returned to his home dose of 70/30      insulin.  He was provided with 2 bottles of insulin prior to      discharge and instructed to get more insulin from the Health      Department.  He was counseled regarding the dangers of running out      of insulin.  2. Nausea, vomiting, and hematemesis.  The day of admission, the      patient was not able to eat very well, but the following morning,      he ate breakfast and tolerated it without any problem and      subsequently, his full appetite resumed.  He had no more episodes      of nausea, vomiting, or hematemesis.  His hemoglobin stabilized at      approximately 12, and a decrease from his admission level of 14 was      considered to be likely due to rehydration.  3. Hypokalemia.  The patient's magnesium and potassium were repleted      during the admission.  His potassium prior to discharge was 3.9,      and he received an additional dose of 40 mEq of potassium by mouth      prior to discharge.  He was provided with potassium tablets to take      over the weekend, and he will return to clinic on Monday for BMET      to check his potassium.   DISCHARGE LABS AND VITALS:   Temperature: 98.7, heart rate: 88-105, blood pressure: 126/76, Resp: 20,  O2sat 98% on room air, sodium 133, potassium 3.9, chloride 103, bicarb  25, glucose: 113 (CBG), BUN 10, creatinine 1.34, WBC: 8.9, hemaglobin:  12.1, platelets: 329      Wesley Dubonnet, MD  Electronically Signed      Manning Charity, MD  Electronically Signed    PN/MEDQ  D:  01/17/2008  T:  01/18/2008  Job:  [id number]

## 2010-08-23 NOTE — Discharge Summary (Signed)
[name], [name]                 ACCOUNT NO.:  [id number]   MEDICAL RECORD NO.:  [id number]          PATIENT TYPE:  INP   LOCATION:  5121                         FACILITY:  [facility]   PHYSICIAN:  Fransisco Hertz, M.D.  DATE OF BIRTH:  20-Jun-1965   DATE OF ADMISSION:  08/07/2008  DATE OF DISCHARGE:  08/09/2008                               DISCHARGE SUMMARY   DISCHARGE DIAGNOSES:  1. Alcoholic pancreatitis.  2. Diabetes type 2 with a hemoglobin A1c of 10.6.  3. Hypertension.   DISCHARGE MEDICATION:  1. Lisinopril/hydrochlorothiazide 20/25 p.o. daily.  2. Protonix 40 mg b.i.d.  3. Neurontin 100 mg t.i.d.  4. NovoLog 70/30, 45 units in the morning and 25 in the evening.   DISPOSITION:  The patient will follow up at the [facility] here.  We evaluated his abdominal pain to see if he is doing  better, to see if he is tolerating diet.  We will also get a CBC to  follow up with his hemoglobin.   PROCEDURES PERFORMED:  None.   CONSULTANTS:  None.   BRIEF ADMITTING HISTORY AND PHYSICAL:  This is a 45 year old with past  medical history of diabetes type 2 with a hemoglobin A1c of 10.0, comes  into the ED because of nausea and vomiting x5, with this morning burning  abdominal pain.  He relates that one day prior to admission he had a  binge drink of wine more than 1 L.  He woke up this morning with nausea  and vomiting, cannot tolerate anything.  He cannot drink water and  cannot tolerate foods.  He relates that food makes it worse and milk  makes it better.  He relates recent use of cocaine and marijuana 1 day  prior to admission.  He denies shortness of breath, chest pain,  palpitations, sweating; no fever or chills.   PHYSICAL EXAMINATION:  VITAL SIGNS:  Temperature 97.7, blood pressure  laying flat 205/116 with a pulse 95 and sitting 117/100 with a pulse of  98.  Pulse 108, respiration 20.  He is sating 99% on room air.  GENERAL APPEARANCE:  He appears in pain  lying on his side.  HEENT:  Eyes are anicteric.  No pallor.  ENT, dry mucous membrane, no  erythema or plaque.  NECK:  Supple.  No thyromegaly.  No JVD.  RESPIRATION:  He has good air movement bilaterally and clear to  auscultation.  CARDIOVASCULAR:  Regular rate and rhythm.  Positive S1 and S2.  No  murmurs, rubs, or gallops.  GI:  Positive bowel sounds, nondistended and soft, no guarding,  nontender, no hepatosplenomegaly.  No CVA tenderness.  EXTREMITIES:  Positive pulses, no edema.  GU:  Deferred.  SKIN:  No skin findings.  LYMPH NODES:  No palpable lymph nodes.  NEUROLOGIC:  Alert and oriented x3.  Coherent and fluent language.  Muscle strength 5/5 in all 4 extremities symmetrically.  DTR 2+.  Finger-  to-nose normal.  Gait normal.  Sensation intact throughout.   LABORATORY DATA ON ADMISSION:  Sodium 139, potassium 3.8, chloride 97,  bicarb of 27, BUN of 13, creatinine 1.4, glucose 204.  Anion gap of 15,  bilirubin 0.8, alkaline phosphatase 131, AST 27, ALT 22, protein 8.9,  albumin 4.4, calcium 9.8.  White count of 15.0 and a hemoglobin of 15.6  with platelet count of 437, MCV of 91.3, RDW 15.7.  EKG showed left  anterior fascicular block and normal sinus rhythm.  Abdominal ultrasound  showed mild nonspecific intracardiac biliary duct dilation.  UA showed 3-  7 red blood cells with greater than 300 of protein.  Alcohol level was  less than 5 and lipase was 22.   HOSPITAL COURSE:  1. Alcoholic gastritis.  We admitted the patient to regular floor.      Kept him n.p.o. for 2 days.  Started Protonix twice a day.  We will      give him antacids, Zofran and Protonix for vomiting.  Morphine prn      for pain. Guaiac his stools, and I hydrated him aggressively as the      patient was orthostatic.  On the morning of discharge, he was      tolerating his diet well, so he had his lunch and was able to      tolerate it well.  So discharge him on Protonix twice a day and      advised him  to keep his appointment in the clinic.  2. Diabetes type 2.  These were cut in half because he was n.p.o. and      he is taking 70/30.  He had no gap on  admission, and once he was      tolerating his diet well, we did resume his insulin.  3. Alkalosis was probably secondary to all his vomiting that he did;      he had no gap on CMET, and after hydration his bicarb came down to      24.  4. Hypertension.  All his blood pressure medications were held on      admission as he was dehydrated.  Once he was discharged from the      hospital stay, they were started.   On the day of discharge, his labs were sodium 132, potassium 3.8,  chloride 95, bicarb of 24, glucose of 211, BUN 11, creatinine 1.4,  calcium 8.8, white count 8.9, hemoglobin 13.9, platelets 335.   On the day of discharge, his vitals were temperature 99.3, pulse 89,  respiration 18, blood pressure 150/99.  He was saturating 97% on room  air.       Marinda Elk, M.D.  Electronically Signed      Fransisco Hertz, M.D.  Electronically Signed    AF/MEDQ  D:  08/09/2008  T:  08/10/2008  Job:  [id number]

## 2010-08-23 NOTE — Discharge Summary (Signed)
NAMEBREK, REECE NO.:  [id number]   MEDICAL RECORD NO.:  [id number]          PATIENT TYPE:  INP   LOCATION:  4735                         FACILITY:  [facility]   PHYSICIAN:  Tacey Ruiz, MD    DATE OF BIRTH:  [birth year]   DATE OF ADMISSION:  07/10/2007  DATE OF DISCHARGE:  07/12/2007                               DISCHARGE SUMMARY   CHIEF COMPLAINT:  Hematemesis.   DISCHARGE DIAGNOSES:  1. Hematemesis likely secondary to Mallory-Weiss tear, resolved.  2. Nausea and vomiting, resolved.  3. Anemia, stable.  4. Diabetes.  5. Leukocytosis, resolved.  6. Hyperthyroidism.  7. Gastroparesis.  8. Renal insufficiency.  9. Hypertension.   DISCHARGE MEDICATIONS:  1. Protonix 40 mg b.i.d.  2. Reglan 10 mg t.i.d. a.c. and nightly.  3. Novolin 70/30 40 units in a.m., 20 units in p.m.  4. Lisinopril 10 mg nightly.  5. Iron sulfate 325 mg b.i.d.   DISPOSITION AND FOLLOWUP:  The patient will follow up in [facility].  At this time, he will need a CBC to ensure his  hemoglobin remains stable, as it was over discharge.  He may benefit  from a colonoscopy as an outpatient, but we will refer to his primary  care physician to set that up.  The patient also had a decreased TSH  level prior to discharge and T3 and T4 levels were checked.  It will be  important to followup these levels, as the patient may be hyperthyroid  and benefit from thyroid ablation.  The patient will also need  medication assistance, as he states he has no income and has difficulty  obtaining his medications.  We have had social work provide the patient  with medications but approximately 2 weeks prior to discharge, we will  need to speak with Mancel Bale from the internal medicine clinic to try  and obtain these medications for a longer duration.   STUDIES:  None.   CULTURES:  None.   CONSULTS:  None.   ATTENDING PHYSICIAN:  Manning Charity, MD   __________   BRIEF  HISTORY:  This is a 45 year old African American male with past  medical history of hypertension, diabetes, hyperlipidemia, and GI bleed  admitted in November secondary to upper GI bleeding who presents to the  ED secondary to nausea and vomiting for approximately 1-1/2 days prior  to admission.  The patient had more than 20 episodes this day and a  half.  Did initially just started with vomiting of food and then noticed  to burgundy colored liquid with his emesis.  He reports coughing 2 days  prior to admission, subjective fevers, and generalized malaise.  He was  started on ibuprofen for symptomatic relief.  The patient also mentioned  constipation for about 5 days prior to admission and 1 episode of black  stool.  The patient endorses chest pain and burning sensation that  started after massive vomiting episodes.  No radiation with 6-7/10.  The  patient has hiccups and shortness of breath secondary to his cough.   ALLERGIES:  None.  PAST MEDICAL HISTORY:  1. Upper GI bleed secondary to esophagitis and distal esophageal      ulceration occurring on November 2008.  2. Diabetes type 2.  3. Gastroparesis.  Gastric emptying study on November 2008 showed an      89% retention after 1 hour  and 56% retention after 2 hours.  4. 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  5. History of polysubstance abuse and alcohol  6. Hypertension.  7. Chronic renal insufficiency, based on creatinine 1.3 and 1.5.  8. Hyperlipidemia.  9. Hiatal hernia.  10.Anemia of chronic disease.   The patient is a current smoker and smokes approximately 10 cigarettes  per day.  The patient is a weekly drinker and drinks about a beer a  week.  The patient has not used cocaine in 4-5 months, and smokes pot  regularly.  The patient is unemployed, separated.   REVIEW OF SYSTEMS:  Positive for joint pain in his left shoulder, which  started yesterday, polyuria, polydipsia.  Otherwise negative except as  noted in HPI.    PHYSICAL EXAMINATION:  VITAL SIGNS:  Temperature 97.5, blood pressure  170/103, pulse 83, respiratory rate 20, O2 saturation 98% on room air.  GENERAL:  The patient was coughing, 1 episode of vomiting of blood kind  of coffee-ground emesis in the ED.  Mild respiratory distress secondary  to coughing.  EYES:  Muddy sclerae. No icterus.  Pupils equal, round, reactive to  light and accommodation.  Extraocular movements intact.  NECK:  Supple.  No thyromegaly, no bruit.  RESPIRATORY:  Clear to auscultation bilaterally.  CARDIOVASCULAR:  Tachycardic with regular rhythm.  No murmurs, rubs, or  gallops.  GI:  Soft, nontender, and nondistended.  Positive bowel sounds.  The  patient reports to have burning and discomfort in the epigastric area.  EXTREMITIES:  No clubbing, cyanosis ,or edema.  No CVA tenderness.  SKIN:  No rash, no lesions.  LYMPHATICS:  No adenopathy.  NEURO:  Alert and oriented x3.  Cranial nerves II through XII intact.  5/5 strength bilaterally.  2+ DTRs.  Normal finger-to-nose and  symmetric.  Sensation intact to light touch.  PSYCH:  Appropriate.   LABORATORY DATA:  CBC:  White count 40.7, hemoglobin 11.4, platelets  449.  Electrolytes:  Sodium 128, potassium 4.4, chloride 89, bicarb 27,  BUN 18, creatinine 1.4, and a glucose of 393.  Bilirubin 1.0, alk phos  96, AST 18, ALT 18,  protein 7.2, albumin 3.5, calcium 9.3.  Cardiac  enzymes were negative x1.  PT was 13.8, INR 1.2.  The patient had an  anemia panel in the past, which showed a ferritin of 49.   HOSPITAL COURSE BY PROBLEM:  1. Hematochezia.  The patient presented with a history of      approximately 30 episodes of vomiting on the day prior to admission      initially which started only with food, but as the vomiting      progressed had some reddish liquid that progressed to coffee-ground      in color.  Most likely secondary to massive amount of volume,      likely it could be explained by a Mallory-Weiss tear.   The patient      was reported to have 1 episode of coffee-ground emesis in the ED,      but upon admission to the floor, nausea, vomiting had resolved.      The patient had no more further episodes of hematemesis.  Upon  initial presentation to the ED, the patient had hemoglobin of 11.4;      however, it felt this likely hemo-concentrated with IV fluid.      Hemoglobin actually dropped into the 9.  On day of discharge, the      patient's hemoglobin was 9.3 and stable.  At the time of discharge      in November 2008, the patient's hemoglobin was 8.7.  The patient      had no further signs of active bleeding.  The patient had a normal      lipase checked.  EDS was negative and alcohol level was less than      5.  The patient was started on Protonix b.i.d., which he was      supposed to be taking at home, but had not been secondary to money      issues.  2. Nausea and vomiting, this resolved.  Likely viral gastroenteritis.      We have provided the patient with no medical treatment, however, we      did restart his Reglan.  The patient has history of gastroparesis      but not taking his medication at home and this could also be      contributing to the patient's nausea and vomiting.  3. Anemia.  The patient initially presented with a hemoglobin of 11.4.      Felt this was likely secondary to hemo-concentration with IV fluid.      The patient's hemoglobin dropped down to the 9s and was stable at      9.3 prior to discharge.  The patient had anemia panel checked,      which showed a normal B12, normal folate, and normal iron level,      however he had a low ferritin and high iron saturation.  We chose      to start the patient on iron sulphate prior to discharge.  The      patient also had a FOBT positive in the ED and this was likely      secondary to his upper GI bleeding.  Since the nausea and vomiting      stopped and the patient's hemoglobin is stable, we did not pursue      this  workup any further.  However as an outpatient setting, it      would be beneficial to repeat the FOBT when the patient has no      active bleeding, and the patient may be referred for a colonoscopy      as an outpatient.  The patient's hemoglobin in November 2008 with      his GI bleed at discharge was 8.6 and stable between 8.6 and 9.4,      which appears to be his baseline.  4. Diabetes.  The patient is on Novolin 70/30 40 units in the morning      and 20 units in the evening prior to admission.  However, upon      admission since we are making him NPO, he was started on a low-dose      of Lantus as well as sliding scale insulin.  The patient's CBGs      were in between the 150s and the 200s and HbA1c was checked, which      was 8.4.  We will restart the patient on a 70/30 and assist him      with medication prior to discharge.  The patient also needs  increased dose of his medication, we will refer medication      titration with his outpatient physician.  5. Leucocytosis.  The patient initially presented with a leucocytosis,      white count of approximately 14.7.  The patient admits signs of an      active infection and this is likely secondary demargination.  The      patient's white count have improved with no treatment.  Therefore,      we felt much more comfortable with this reasoning.  The patient's      white count is normal at 10.0 prior to discharge.  6. Gastroparesis.  The patient had not been on his Reglan.  He has a      history of gastroparesis with a positive gastric emptying study in      November 2008.  His gastroparesis and cessation of Reglan could      have also secondary to the patient's nausea and vomiting prior to      admission.  The patient was discharged on his Reglan and nausea and      vomiting resolved.  The patient will need this medications as an      outpatient.  7. Renal insufficiency.  The patient has chronic renal insufficiency      and his  baseline creatinine is between 1.3 and 1.4.  Initially on      presentation, the patient's creatinine was 1.4.  With IV fluid, the      patient's creatinine improved to 1.2 prior to discharge.  8. Hypertension.  The patient's blood pressure medications were held.      The patient was on no blood pressure medications prior to      admission.  With the GI bleed, chose not to restart any at this      time.  The patient's blood pressure slowly began to increase over      the hospital course and prior to discharge, lisinopril 10 mg was      restarted.  His medication may need to be titrated in the      outpatient setting.  However, the patient's blood pressure was      stable.  9. Hyperthyroidism.  The patient had his TSH level checked prior to      discharge, which was low.  Therefore, T3 and T4 levels were checked      and it was pending at the time of discharge.  The patient may have      some hyperthyroidism and these levels can be followed up in the      outpatient clinic.  Any hyperthyroidism workup will be referred to      that setting.   DISCHARGE LABS AND VITALS:  Temperature 98.8, pulse 88, respiratory 20,  blood pressure 154/89, O2 99% on room air, glucose 187.  CBC:  White  count 10.0, hemoglobin 9.3, platelets 365.  Electrolytes:  Sodium 138,  potassium 4.0, chloride 185, bicarb 26, BUN 11, creatinine 1.1, and a  glucose of 160.      Tacey Ruiz, MD  Electronically Signed     JP/MEDQ  D:  07/12/2007  T:  07/13/2007  Job:  [id number]

## 2010-08-23 NOTE — Discharge Summary (Signed)
[name], [name]                 ACCOUNT NO.:  [id number]   MEDICAL RECORD NO.:  [id number]          PATIENT TYPE:  INP   LOCATION:  1427                         FACILITY:  [facility]   PHYSICIAN:  Heloise Purpura, MD      DATE OF BIRTH:  1965-09-08   DATE OF ADMISSION:  10/22/2008  DATE OF DISCHARGE:  10/26/2008                               DISCHARGE SUMMARY   ADMISSION DIAGNOSES:  1. Left nonfunctioning kidney.  2. History of left pyonephrosis status post left percutaneous      drainage.   DISCHARGE DIAGNOSES:  1. Left nonfunctioning kidney.  2. History of left pyonephrosis status post left percutaneous      drainage.   HISTORY OF PRESENT ILLNESS:  For full details of history and physical,  please see the admission history and physical.  Briefly, 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  He was  admitted to the hospital in early June with complaints of abdominal pain  and was found to have pyonephrosis which was drained with a left  percutaneous nephrostomy tube.  His symptoms gradually improved and he  was managed as an outpatient with antibiotic therapy for 6 weeks.  He  followed up in consultation to discuss undergoing a left laparoscopic  nephrectomy.  He did wish to proceed with this procedure.   HOSPITAL COURSE:  On October 22, 2008, the patient was taken to the  operating room and underwent a left laparoscopic nephrectomy.  He  tolerated this procedure well and without complications.  Postoperatively he was able to be transferred to a regular hospital room  following recovery from anesthesia.  He was maintained on ciprofloxacin  throughout his hospital course, considering his history of pyonephrosis  and the fact that the renal collecting system was entered during his  procedure.  However, he remained afebrile throughout his  hospitalization.  On postoperative day #1, he  remained hemodynamically  stable and his hemoglobin was rechecked and found to be stable at 11.  In addition, his renal function remained stable throughout his hospital  course, confirming that he appeared to have very little function from  his left kidney.  He gradually began ambulating and his diet was  advanced.  He did have some nausea and shortness of breath on  postoperative day #1.  The chest x-ray was obtained, which did not  demonstrate any evidence for pneumonia or pneumothorax.  Of note, a  critical report was called indicating air within the peritoneal cavity.  However, this was expected, considering the patient did undergo a  transperitoneal and not a retroperitoneal operation.  His shortness of  breath improved and he showed no objective signs of hypoxemia.  His  nausea gradually resolved and his diet was able to be advanced.  By  postoperative day #4, he was tolerating a diabetic diet and ambulating  without difficulty. His pain was controlled with oral pain medication  and he was felt stable for discharge.  His blood glucose levels were  monitored during his  hospitalization and he was gradually placed back  onto his home medical regimen.   DISPOSITION:  Home.   DISCHARGE MEDICATIONS:  He was instructed to resume his regular home  medications.  He was discharged home on prescriptions including Vicodin  for pain and ciprofloxacin for 1 week prophylactically for his history  of pyonephrosis.   FOLLOWUP:  He will follow up in approximately 1 to 2 weeks for further  postoperative evaluation and to review his surgical pathology report.      Heloise Purpura, MD  Electronically Signed     LB/MEDQ  D:  10/26/2008  T:  10/26/2008  Job:  [id number]

## 2010-08-26 NOTE — Consult Note (Signed)
[name], [name]                 ACCOUNT NO.:  [id number]   MEDICAL RECORD NO.:  [id number]           PATIENT TYPE:   LOCATION:                                 FACILITY:   PHYSICIAN:  Theresia Majors. Tanda Rockers, M.D.DATE OF BIRTH:  08/25/1965   DATE OF CONSULTATION:  DATE OF DISCHARGE:                                   CONSULTATION   REASON FOR CONSULTATION:  [name] is a 45 year old man referred by Dr. Margo Aye  for evaluation of neuropathy and painful hammertoes.   IMPRESSION:  Severe diabetic neuropathy with plantar warts.   RECOMMENDATIONS:  Multiple calluses were pared in the clinic, as well as an  excision of the plantar wart and topical application of collodion.   PLAN:  We will see the patient in 30 days for reevaluation of his pain  complex.  In the interim, we have encouraged him to keep his appointments  with Dr. Margo Aye and to comply with Dr. Scharlene Gloss instructions toward affect and  control of his blood sugar and hypertension.   SUBJECTIVE:  This 45 year old man has been under multiple medical providers  for the last eight years.  The last six months, he has been under Dr. Scharlene Gloss  care for the treatment of diabetes and hypertension.   He denies allergies.   His current medications include Neurontin 300 mg t.i.d. and 600 mg at h.s.  His insulin coverage is NovoLog 70/30, 55 units in the morning and 30-35  units in the evening.  He is also on lisinopril 10 mg daily.   He denies previous surgery.   His family history is positive for diabetes, myocardial infarctions,  hypertension, glaucoma, renal failure.  His father was on dialysis.   Socially, he is married with two children.  He is originally from [place].  He is now living in [place] with his wife.  He is unemployed due  to his health.  He is currently in the application process with disability.  He has previously been employed as a Careers adviser.   The review of systems is remarkable for some change in  visual acuity, but he  has no focal or long track signs consistent with TIAs.  He denies angina  pectoris.  He denies polyuria or dysuria.  His GI review of systems is  negative.  The remainder of his review of systems is negative.   On physical exam, he is alert, oriented in no acute distress.  The HEENT  exam is clear.  The neck is supple.  Carotid bruits are not appreciated.  Trachea is midline.  The lungs are clear.  The heart sounds are normal.  The  abdomen is soft without aneurysmal dilatation.  The femoral pulses are 3+,  dorsalis pedis pulses are 3+.  There is trace edema bilaterally.  There are  marked trophic changes of both feet with some contractures at the MP joints.  There are areas of abrasions secondary to poor-fitting shoes with extreme  callus formation over the MP joint medially on the right foot.  On the volar  aspect of  both feet are classic plantar warts which are painful to  palpation.  Both these areas were sharply excised, the roots appeared to be  intact and were over-painted with collodion.  Neurologically, the patient  has preservation of protective sensation.  There is no adenopathy.  There  are no dominant skin changes.   DISCUSSION:  We have spent considerable time explaining the necessity for  medical compliance with [name].  His neuropathy has been characterized as  reflecting the state of his diabetes.  We have encouraged him to continue  compliance by adhering to Dr. Scharlene Gloss orders.  We have expressed willingness  to see him as a patient in the wound care center to pare his feet of the  calluses and in addition we have written prescriptions for custom inserts  and shoes.  The patient seems to understand.  He has viewed a patient  instructional tape.  We will see him in 30 days.       ___________________________________________  Theresia Majors Tanda Rockers, M.D.    [name]  D:  05/05/2004  T:  05/05/2004  Job:  [id number]   cc:   Dr. Margo Aye

## 2010-08-26 NOTE — Discharge Summary (Signed)
[name], [name]                 ACCOUNT NO.:  [id number]   MEDICAL RECORD NO.:  [id number]          PATIENT TYPE:  INP   LOCATION:  3737                         FACILITY:  [facility]   PHYSICIAN:  Alvester Morin, M.D.  DATE OF BIRTH:  04-30-1965   DATE OF ADMISSION:  10/31/2005  DATE OF DISCHARGE:  11/04/2005                                 DISCHARGE SUMMARY   ADDENDUM   At the time of the appointment with Dr. Laveda Abbe, he should check also  urine cultures besides the urinalysis, BMET and CBC.      Carlus Pavlov, M.D.  Electronically Signed      Alvester Morin, M.D.  Electronically Signed    CG/MEDQ  D:  11/07/2005  T:  11/07/2005  Job:  [id number]

## 2010-08-26 NOTE — Discharge Summary (Signed)
NAMEJUNIPER, [name]                 ACCOUNT NO.:  [id number]   MEDICAL RECORD NO.:  [id number]          PATIENT TYPE:  INP   LOCATION:  5511                         FACILITY:  [facility]   PHYSICIAN:  Alvester Morin, M.D.  DATE OF BIRTH:  1965/06/03   DATE OF ADMISSION:  10/12/2005  DATE OF DISCHARGE:  10/14/2005                                 DISCHARGE SUMMARY   DISCHARGE DIAGNOSES:  1. Severe ulcerative esophagitis complicated with hematemesis s/p EGD done      by Dr. Christella Hartigan.  2. DKA resolved at discharge.  3. Leukocytosis secondary to #2 resolved at discharge.  4. Diabetes mellitus type 1.  5. Acute renal insufficiency secondary to #2 resolved at discharge.  6. Hypertension.  7. Prolonged QT interval.  8. Polysubstance abuse.   DISCHARGE MEDICATIONS:  1. Doxycycline 100 mg p.o. b.i.d. x10 days.  2. NovoLog mix 70/30 insulin 26 units [place] q. a.m. and 14 units [place] q. p.m.  3. Lisinopril 10 mg p.o. daily.  4. Multivitamin 1 tablet p.o. daily.  5. Tylenol 650 mg p.o. q.8 hours p.r.n.   DISPOSITION AND FOLLOWUP:  The patient was discharged after his condition  stabilized on the floor.  The patient will return to the residents clinic to  see medical student [name] and Dr. Vanetta Mulders on October 20, 2005, at  1:30 p.m.  At this time, he will need followup on the report of his gastric  biopsy.  He will also need followup on his blood glucose level and his  insulin compliance.   PROCEDURE PERFORMED:  October 11, 2005, chest x-ray showed mild chronic  bronchitic changes with no acute abnormalities.  October 12, 2005, chest x-ray  portable showed poor inspiration and no changes from the previous film.  Chest x-ray on October 13, 2005, showed no acute cardiopulmonary process.  On  October 13, 2005, portable abdominal films showed no free air, nonspecific gas  pattern.  October 12, 2005, EGD showed severe ulcerative reflux esophagitis and  abnormal gastric folds, which were biopsied.   CONSULTATIONS:  Dr.  Rob Bunting from GI performed the EGD.   BRIEF ADMITTING HISTORY AND PHYSICAL:  This is a 45 year old male with  insulin-dependent diabetes mellitus, hypertension, and GERD, who was  admitted with the chief complaint of vomiting blood with 8 episodes of  hematemesis during the day prior to admission.  This occurred in the context  of cocaine and alcohol use, and noncompliance with home medications,  including insulin.   PHYSICAL EXAMINATION:  VITALS:  Temperature 97.0, pulse 105, blood pressure  151/100, respirations 32, O2 Sat 99%.  HEENT:  The mouth was dry.  CARDIOPULMONARY:  Notable for tachypnea with use of accessory muscles with  clear lungs and tachycardia with a regular rhythm.   ADMISSION LABORATORIES:  Showed sodium 134, potassium 4.5, chloride 93,  bicarbonate 20, BUN 17, creatinine 1.7, glucose 347, calcium 9.4, magnesium  2.2, phosphorus 3.3, bilirubin 1.8, alkaline phosphatase 121, AST 22, ALT  21, protein 7.9, albumin 3.7, lipase was 31.  CBC showed a white count of  12.6, hemoglobin  14.2, platelets 101, the ANC is 10.7 and MCV was 91.3.  Arterial blood gases showed a pH of 7.41, pCO2 of 30, and a pO2 of 100.  Urinalysis showed glucose greater than 1000, ketones greater than 80, a  large amount of blood, no protein, negative leukocyte esterase, negative  nitrates, rare leukocytes, white blood cells 0-2, red blood cells 7-10, and  rare bacteria.  Drug screen was positive for cocaine and THC.  Blood ethanol  was less than 5.  Chest x-ray showed mild chronic bronchitic changes with no  infiltrates.  Cardiac enzymes were negative.  EKG showed sinus rhythm with a  prolonged QTC of 473 and left axis deviation, but was otherwise normal.   HOSPITAL COURSE:  1. Hematemesis and abdominal pain.  The patient was admitted to the      stepdown unit, and frequent CBCs were checked.  His hemoglobin      stabilized over the next day at approximately 12.  The patient was made       n.p.o. and given Protonix IV, as well as ondansetron and Phenergan for      nausea.  His nausea and vomiting stopped, and he was moved to the      floor.  Due to his history of alcohol abuse, he was given thiamine on      his first hospital day.  EGD was performed on October 12, 2005, revealed      severe ulcerative reflux esophagitis as well as abnormal gastric folds,      which were biopsied.  No vessels were visible on endoscopy.  The      patient is to take PPI b.i.d. for 6 weeks and then once daily      thereafter.  The patient was counseled on the importance of abstaining      from cocaine and alcohol to avoid future upper GI bleeds.  2. Diabetes.  On admission, the patient had hyperglycemia with increased      anion gap and ketonuria.  Clinically, he appeared dehydrated.  He was      given normal saline IV, and insulin was begun at 3 units/hour.      Capillary blood glucose was checked every hour for the first several      hours, and electrolytes were checked every 2 hours.  During his first      day of admission, his anion gap closed and his blood glucose averaged      approximately 200.  It was subsequently determined that his hemoglobin      A1c was 12.4.  The patient had not been taking his insulin prior to      admission.  A new dose was calculated based on a mass of 80 kg:      NovoLog mix 70/30 insulin 26 units [place] q. a.m. and 14 units [place] q. p.m.      The patient was instructed about the importance of taking his insulin      as prescribed, and provided with samples.  3. Leukocytosis.  On admission, the patient's white count was 12.6, and it      continued to trend up during his first hospital day to a maximum of      21.0.  Search for a source of infection revealed no cause:  Repeated      chest x-rays were negative, including after hydration, urinalysis did     not suggest infection, blood cultures were negative.  Nevertheless, the  patient was started on empiric intravenous  azithromycin and ceftriaxone      to cover for possible pneumonia.  Beginning on his second hospital day,      his white count began to trend down and was 14.0 on the day of      discharge.  The patient was provided with samples of doxycycline to      take as an outpatient to complete his course of antibiotics.  4. Acute renal insufficiency.  On admission, the patient's creatinine was      1.7; it decreased promptly to 1.0 in response to hydration, and was 1.2      on the day of discharge.  The most likely etiology of this patient's      renal insufficiency was prerenal due to hypovolemia.  Urinalysis on      admission revealed a large amount of blood, though it was negative for      protein.  Microalbumin was subsequently found to be 3.85, and the      microalbumin/creatinine ratio was found to be 52.5.  These changes are      most likely due to hypertension, which itself is mostly likely due to      cocaine abuse as well as noncompliance with antihypertensive      medication, as well as diabetic nephropathy.  5. Hypertension.  Given the history of recent cocaine abuse, myocardial      infarction was considered initially in this patient.  Serial cardiac      enzymes were negative x3.  Admission EKG was within normal limits with      the exception of a QTC of 473 msec (see below).  Once the patient's      renal function began to trend towards baseline, he was restarted on his      lisinopril.  He reported discontinuing this medication at home due to      difficulty affording medical attention.  He was, therefore, provided      with samples of lisinopril upon discharge.  6. Prolonged QT interval.  The patient was noted on his admission EKG to      have a prolonged QT interval.  The significance and etiology of this      finding remain unknown.  Repeat EKG showed the QTC was stable.      Nevertheless, we were cautious to avoid fluoroquinolones to prevent      drug-induced QT prolongation.   The patient was advised of the dangers      of continued cocaine abuse.  7. Polysubstance abuse.  The patient stated he was interested in help for      his substance abuse.  He was advised regarding the adverse effects of      substance abuse on his health.  He was encouraged in his desire to      change his behavior.  He was provided with numbers for AA and NA.      Vanetta Mulders, MD  Electronically Signed      Alvester Morin, M.D.  Electronically Signed    DA/MEDQ  D:  10/18/2005  T:  10/18/2005  Job:  [id number]

## 2010-08-26 NOTE — Discharge Summary (Signed)
[name], SCALZO                 ACCOUNT NO.:  [id number]   MEDICAL RECORD NO.:  [id number]          PATIENT TYPE:  INP   LOCATION:  3737                         FACILITY:  [facility]   PHYSICIAN:  Alvester Morin, M.D.  DATE OF BIRTH:  24-Dec-1965   DATE OF ADMISSION:  10/31/2005  DATE OF DISCHARGE:  11/04/2005                                 DISCHARGE SUMMARY   DISCHARGE DIAGNOSES:  1. 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  2. Diabetes mellitus, type 1, uncontrolled, with a hemoglobin A1c of 11.3.      The patient is on insulin 70/30, and he had multiple diabetic      ketoacidosis episodes.  Last admission was on October 12, 2005, and a      microalbumin/creatinine ratio is increased, 12.4, on October 12, 2005.  3. Anemia with a hemoglobin of 9.8.  4. Ulcerative esophagitis-severe, complicated with upper gastrointestinal      bleed on October 12, 2005.  5. Hypertension.  6. Hyperlipidemia with a cholesterol 200, triglycerides 52, and LDL 129.  7. Polysubstance abuse (cocaine, marijuana, alcohol).  8. History of acute renal failure on October 12, 2005.  During that      hospitalization, the creatinine dropped from 1.9 to 1.2.  9. Abnormal gastric folds on upper esophagogastroduodenoscopy with a      negative Helicobacter pylori, no metaplasia, no active inflammation.   MEDICATIONS AT DISCHARGE:  1. Protonix 40 mg p.o. two times daily for four weeks and then 40 mg p.o.      one time daily.  2. Gabapentin 200 mg, two tablets p.o. twice daily.  3. Thiamine 100 mg p.o. daily.  4. Folic acid 1 mg p.o. daily.  5. Insulin 70/30, 26 units in a.m. and 14 units in p.m. subcutaneous      injections.  6. Colace 200 mg, two capsules by mouth two times daily p.r.n.      constipation.  7. Lisinopril 10 mg, one tablet by mouth daily.  8. Tylenol 650 mg, two tablets p.o. q.6h p.r.n. fever or pain.  9. Dulcolax suppositories 10 mg, one suppository daily p.r.n.  constipation.  10.Bactrim one tablet by mouth two times daily for four weeks.   FOLLOWUP:  The patient has an appointment with Dr. Laveda Abbe in the  outpatient clinic on the 8th of August at 3 p.m. for which he will check a  urinalysis, urine culture, a BMET, and a CBC.   PROCEDURES:  The patient had a chest x-ray that showed no acute distress, no  free air under the diaphragm.  Had an abdominal x-ray that showed fluid and  air-filled loops suggesting partial small bowel obstruction, no free air  seen but cannot completely exclude it.  A CT of the abdomen showed chronic  UPJ obstruction on the left perirenal stranding suggesting acute infection  or worsening of obstruction, no stone disease, no acute findings.  Nuclear  Medicine imaging scan of the kidneys showed markedly decreased function on  the left kidney of 1.4% and normal function on the right  of 98.6% consistent  with CT evidence of significant obstruction.   CONSULTATIONS:  Courtney Paris, MD, Urology.   The patient presented with abdominal pain.  For full details, please refer  to the patient's chart.  Briefly, [name] is a 45 year old, African-  American man, with past medical history significant for diabetes mellitus  type 1, with severe reflux ulcerative esophagitis, with a history of acute  renal failure due to dehydration at his last admission, with a baseline  creatinine of 1.2, who presented to the outpatient clinic for abdominal  pain, 10 out of 10, in left lower quadrant that radiated in the flank.  The  patient also complained of chronic constipation and was on day nine of  constipation on the day of admission.  He believed that his symptoms are due  to no bowel movements.  He also admitted to urinary frequency and difficulty  to void urine on the day of admission.   PHYSICAL EXAMINATION:  VITAL SIGNS:  On admission, pulse 108, temperature  99.8, respirations 20, oxygen saturation 100% on room air, blood  pressure  137/84.  Orthostatics: lying down, blood pressure 144/85, sitting blood  pressure 155/94, and standing blood pressure 152/92.  GENERAL:  Positive for hiccups and eructations.  HEENT:  The oropharynx was erythematous with no exudates, with dry mucosa,  enlarged tonsils, and poor dentition.  CARDIOVASCULAR:  Positive for tachycardia, the rhythm was regular.  GI:  Bowel sounds negative, abdomen soft, tender to palpation in the left  lower quadrant, plus rebound, tender to palpation in left flank but without  costovertebral angle tenderness plus rebound, plus guarding on the left.  SKIN:  Cold and dry.  NEURO:  Exam was normal.   LABORATORY DATA ON ADMISSION:  Sodium 133, chloride 136.2, potassium 4.6,  chloride 98, bicarb 21, BUN 9, creatinine 1.2, glucose 303.  Bilirubin 1,  alkaline-phosphatase 84, SGOT 11, SGPT 11, albumin 2.1, lipase 16,  leukocytes 11.7 with an ANC of 83%, hemoglobin 9.8 with an MCV of 91.1,  hematocrit 29.7, and thrombocytes 659.  Fecal occult blood test was  negative.  A blood alcohol level less than 5.  Acetone in blood: small.  Acetaminophen in blood <10.  Hemoglobin A1c was 11.3, and a CBG was 373.  Group-A Strep test was negative.  Urinalysis showed glucose more than 1000,  bilirubin small, ketones 40 mg/dl, blood small, protein 30 mg/dl, nitrates  and leukocytes negative.   ASSESSMENT AND PLAN:  1. Abdominal pain:  The abdominal pain was very concerning for      diverticulitis (r/o by CT) or pancreatitis (r/o by a negative lipase)      given previous history of alcohol abuse.  The CT also ruled out      nephrolithiasis. We have ruled out possible perforation of the small      bowel by doing an abdominal and a chest x-ray that were negative for      free air under the diaphragm. The abdominal CT showed chronic      pyeloureteral junction obstruction on the left with perirenal stranding     suggesting acute infection or worsening of obstruction. To  evaluate the      function of the left kidney, we have done a nuclear magnetic functional      test of the kidneys that showed that the left kidney was almost      nonfunctional at a 1.4% compared to the right, 98.6%.  The chronic  obstruction in the left kidney accompanied by an increased size of the      left kidney ruled in a possible chronic pyelonephritis superimposed on      the hydronephrosis.  A urinalysis showed turbid urine with large blood,      more than 300 protein and pyuria.  We started the patient on Rocephin      initially, and then changed the antibiotic regimen to Bactrim and we      will continue the Bactrim for four weeks after dischargeto treat his      chronic pyelonephritis.  This plan was discussed with Dr. Aldean Ast who      was in agreement. We also performed blood cultures x2 and urine      cultures and they were negative.  The patient will be seeing Dr.      Laveda Abbe in the outpatient clinic and at this point he will have a      urinalysis and urine culture done to see if his pyuria has resolved.  2. Chronic pyeloureteral junction obstruction: see problem #1  3. Chronic pyelonephritis, secondary to #2  4. Diabetes mellitus type 1:  The patient had repeated diabetic      ketoacidosis episodes in the past secondary to noncompliance with his      medication.  During this hospitalization, he was initially placed on      sliding scale insulin and Lantus. Towards the end of the stay we      started him on his insulin 70/30 home regimen.  5. Anemia:  His hemoglobin was between 8 and 9 the entire stay in the      hospital.  Before discharge, it was 8.2.  We checked an erythropoietin      level due to possible low stimulation of the bone marrow because of his      renal problem; however, the erythropoietin was 39.6, about normal      limit.  His anemia will be worked up further as an outpatient.  6. Polysubstance abuse:  The patient denies the use of cocaine,  marijuana,      and alcohol at this admission; we have started him on B1 and folate and      we discharged him on these two drugs.  7. Ulcerative esophagitis:  In the hospital, we placed the patient on      Protonix intravenously b.i.d. 40 mg and then before discharge we      changed him to Protonix 40 mg p.o. b.i.d. for four weeks and then once      daily.  8. Hypertension:  The patient was discharged on his home medications with      his Lisinopril, and Dr. Laveda Abbe will need to check his blood pressure      during his appointment in the outpatient clinic and adjust the      medications accordingly.   At discharge, the patient's temperature was 99.4, systolic blood pressure  160, diastolic 90, pulse 96, respirations 18, oxygen saturation 98% on room  air, CBG 169, leukocytes 8.7, hemoglobin 8.2, hematocrit 24.6, thrombocytes  682, sodium 137, potassium 3.5, chloride 105, bicarb 23, BUN 3, creatinine  1, glucose 1.5, calcium 8.  CONDITION ON DISCHARGE:  Improved.  There are no pending labs.  The patient  was instructed to restrict his protein intake.      Carlus Pavlov, M.D.  Electronically Signed      Alvester Morin, M.D.  Electronically Signed  CG/MEDQ  D:  11/07/2005  T:  11/07/2005  Job:  [id number]   cc:   Hollace Hayward, M.D.  Courtney Paris, M.D.

## 2010-08-26 NOTE — Discharge Summary (Signed)
[name], [name] NO.:  [id number]   MEDICAL RECORD NO.:  [id number]          PATIENT TYPE:  INP   LOCATION:  5729                         FACILITY:  [facility]   PHYSICIAN:  Wesley Lav, MD  DATE OF BIRTH:  December 30, 1965   DATE OF ADMISSION:  02/27/2007  DATE OF DISCHARGE:  03/04/2007                               DISCHARGE SUMMARY   DISCHARGE DIAGNOSES:  1. Upper gastrointestinal bleed secondary to esophagitis and distal      esophageal ulceration.  2. Acute blood loss anemia secondary to problem #1, status post 2      units of packed red blood cells.  3. 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  4. Polysubstance abuse, notably alcohol and cocaine.  5. 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  6. Hypertension.  7. Chronic renal insufficiency with a baseline creatinine of 1.3 to      1.5.  8. Hyperlipidemia.   DISCHARGE MEDICATIONS:  1. Thiamine 100 mg p.o. daily.  2. Folic acid 1 mg p.o. daily.  3. Insulin 70/30, inject 30 units subcutaneous in the morning and 25      units subcutaneous in the evening.  4. Omeprazole 40 mg p.o. b.i.d.  5. Reglan 10 mg p.o. t.i.d. and h.s.   DISPOSITION:  The patient was discharged in hemodynamically stable  condition.  He was scheduled [name] March 14, 2007 at 3 p.m.  At that time,  please follow up on recurrent bleed.  Please make sure that the patient  is taking his Reglan for the gastroparesis.  Also please address his  diabetes control and continue counseling regarding polysubstance abuse.   PROCEDURES:  1. Abdominal series on February 28, 2007 showed no active      cardiopulmonary process.  No evidence of bowel obstruction.      __________  levels in his stomach and colon nonspecific.  2. EGD on February 28, 2007 by Dr. Vida [name]  showed a small hiatal      hernia with significant distal esophageal ulcerations and      esophagitis.  About 700 mL of old blood was suctioned in the      stomach; unable to see underneath the old blood, but no signs of      active bleeding.  Otherwise, normal EGD with signs of gastroparesis      and minimal duodenal motility.  3. Gastric emptying study on March 04, 2007 showed abnormally low      gastric emptying (gastric retention at 1 hour was 89% and 56% at 2      hours).   CONSULTATIONS:  [name], M.D. from [name] was consulted.   ADMISSION HISTORY:  [name] is a 45 year old man with multiple medical  problems including diabetes mellitus type 1 complicated with no  difficulty, __________  history of hematemesis secondary to esophagitis,  hypertension, hyperlipidemia and polysubstance abuse who  presented to  the [facility] ED with complaints of nausea, vomiting and diarrhea.  The  patient had been released 2 days prior to admission from a 65-day  imprisonment.  He reported compliance with his medications until the day  prior to admission.  He endorsed vomiting, about 7-8 times, with a  description consistent with coffee grounds.  He also endorsed  intermittent burning abdominal pain and had had new onset diarrhea in  the 12 hours prior to admission.  Some stools were red, while others  were very dark.  He denied any fevers or recent travel.  Had drunk 1  bottle of wine prior to admission and one hit of cocaine.  He denied  any NSAID or aspirin use.   ADMISSION PHYSICAL EXAMINATION:  VITAL SIGNS:  Temperature 96.7, blood  pressure 88/57, pulse 100, respiratory rate 20, O2 saturation 96% on 2  liters.  GENERAL:  The patient was somnolent, ill-appearing, in moderate  distress.  HEENT/NECK:  Eye and neck exam was within normal limits.  LUNGS:  Clear to auscultation bilaterally with good air movement.  HEART:  Regular rate and rhythm.  No murmurs, rubs, or  gallops.  ABDOMEN:  Decreased abdominal bowel sounds, but abdomen soft, nontender,  nondistended.  EXTREMITIES:  No edema, cyanosis or clubbing.  RECTAL:  Dark brown stool, Hemoccult positive, on rectal exam.  NEUROLOGIC:  Grossly nonfocal.   ADMISSION LABORATORIES:  White blood count 13.8, hemoglobin 6.4, MCV 85,  platelets 236, RDW 16.6.   HOSPITAL COURSE:  1. Upper [name] bleed secondary to esophagitis.  Since the patient had a      prior history of erosive esophagitis, it was felt that he probably      had a recurrence of his same issues on admission.  The patient was      admitted to a step-down unit and fluid resuscitated.  His blood      pressure responded well to hydration.  Given that his hemoglobin      was 6.4, which was a drop of more than 5 gm from the day prior, he      was transfused 2 units of packed red blood cells.  Gastroenterology      was called for an evaluation.  Coags were checked and found to be      normal.  The patient's CBC was monitored in serial fashion until      the patient had an EGD done.  Please see above procedures for EGD      findings.  The patient had been started on IV Protonix on      admission, which was continued until he was able to tolerate      fluids.  He did not have any further episodes of bleeding while      hospitalized.  2. Anemia.  As previously mentioned on the admission, the patient's      hemoglobin was 6.4.  He responded well to transfusion of 2 units of      packed red blood cells.  He did not require any further transfusion      during his hospital course.  His hemoglobin stabilized around 9      before discharge.  3. Type 1 diabetes mellitus.  The patient was started on an insulin      sliding scale on admission, given that he is on 70/30 insulin as an      outpatient.  He was continued on his home regimen of 22  units      q.a.m. and 14 units q.p.m., although this was not enough, given      that the patient was hyperglycemic.   His 70/30 doses had to be      adjusted.  4. Gastroparesis secondary to diabetes.  On EGD, it was noticed that      the patient had evidence of gastroparesis, which was also      suspected, given that 700 cc of old blood was found in his stomach.      A gastric emptying study was performed, which confirms severe      gastroparesis.  The patient was started on Reglan to facilitate      gastric emptying.  5. Polysubstance abuse.  The patient has a history of alcohol and      cocaine abuse, which he had both used on the day prior to      admission.  It was felt that his alcohol use certainly could have      contributed to the ulcerative esophagitis.  He was kept on a CIWA      protocol while hospitalized and did not show symptoms of      withdrawal.  He was also kept on thiamine and folate.  6. 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  The patient's renal      insufficiency was at baseline, and his urinalysis was within normal      limits.  7. Hypertension.  On admission, the patient's lisinopril was held,      given his acute [name] bleed.  His blood pressure trended back to the      stage II hypertension range on the day of discharge, so he was sent      home on lisinopril.  8. Hyperlipidemia.  The patient had not been treated for his      hyperlipidemia in a long time, so we did not resume statin      treatment while hospitalized.  We will defer this to his primary      care physician, [name], when he follows up in the outpatient      clinic.   DISCHARGE LABORATORIES:  Sodium 135, potassium 4.5, chloride 102,  bicarbonate 28, BUN 18, creatinine 1.26, glucose 365.  White blood count  8.8, hemoglobin 8.8, platelets 213.   DISCHARGE VITALS:  Temperature 99.2, blood pressure 160/98, heart rate  81, respiratory rate 20, O2 saturation 100% on room air.      Olene Craven, M.D.  Electronically  Signed      Wesley Lav, MD  Electronically Signed    MC/MEDQ  D:  04/17/2007  T:  04/18/2007  Job:  [id number]   cc:   Wesley Friar, MD

## 2010-08-26 NOTE — Consult Note (Signed)
NAMEWYETH, HOFFER                 ACCOUNT NO.:  [id number]   MEDICAL RECORD NO.:  [id number]          PATIENT TYPE:  OBV   LOCATION:  3737                         FACILITY:  [facility]   PHYSICIAN:  Courtney Paris, M.D.DATE OF BIRTH:  May 11, 1965   DATE OF CONSULTATION:  11/01/2005  DATE OF DISCHARGE:                                   CONSULTATION   REASON FOR CONSULTATION:  Chronic left hydronephrosis.   BRIEF HISTORY:  This 45 year old black male was admitted yesterday with  worsening abdominal pain.  He is an insulin requiring diabetic and was  admitted just a few weeks ago on October 11, 2005 with polysubstance abuse.  He  also has hypertension and poorly controlled diabetes.  He has had some  hiccups and primarily left lower quadrant abdominal pain that is crampy.  He  has known ulcerative esophagitis.  His hematocrit has dropped from 42.7% on  July 4 to 26.8% today.  A flat and upright abdomen does show some small  bowel obstruction seen yesterday.  He did have a mass in his left side,  which on CT scan shows a grossly hydronephrotic kidney with very thin  parenchyma secondary to a chronic UPJ obstruction, most likely.  No stones  seen.  The right kidney looks normal.  He had no stones seen.  The patient  has been afebrile, but is on antibiotics.  His renal function is normal with  a creatinine of 1.1.  When he was here 3 weeks ago, he had some mild renal  insufficiency, but with hydration came back to a normal creatinine of 1.2.  He lives alone.  He is separated from his wife times 5 years.  Has no  children with her.  He has no full brothers, just some adopted half-brothers  and both parents deceased.  His father of cancer of the lung and his mother  for congestive heart failure.  He is unemployed at the present time.  Just  started a new job recently.  Family history, social history unremarkable  otherwise.  He does binge on drugs and alcohol at times by history.  His  hypertensive medications he takes somewhat irregularly.  His medicines  include Protonix, insulin, folic acid, Colace.  He has a bowel movement only  about once a week by history.  Did have an enema done yesterday.  He has had  some hiccups the last 2 and 1/2 days.   His other review of systems, 12 point and family history and social history  are negative except as noted above.   PHYSICAL EXAMINATION:  VITAL SIGNS:  His blood pressure is 140/81, pulse 98,  respirations 20, temperature 99.3.  GENERAL:  He is a pleasant middle-aged African American male in no acute  distress, but having chronic hiccups.  HEENT:  Clear.  LUNGS:  Reveal some rhonchi.  ABDOMEN:  Distended with tenderness in the left lower quadrant, but no  rebound.  No CVA pain.  GU:  The penis is normal and uncircumcised.  Bilateral descended testes.  Prostate is small and benign.  EXTREMITIES:  Faint  distal pulses.  Intact sensation to light touch.   IMPRESSION:  1.  Chronic left UPJ (ureteropelvic junction) obstruction with probably      little to no function.  2.  This is not contributing to his most recent problems which could be      diverticulitis or GI bleeding needs to be      ruled out.  3.  He has had a marked decrease in his hematocrit.  Also had some slight      microscopic hematuria that may need to be worked up at a later time, but      no apparent UTI (urinary tract infection) at this time.      Courtney Paris, M.D.  Electronically Signed     HMK/MEDQ  D:  11/01/2005  T:  11/02/2005  Job:  [id number]

## 2010-08-26 NOTE — Discharge Summary (Signed)
NAME:  ZEBULON, GANTT                 ACCOUNT NO.:  [id number]   MEDICAL RECORD NO.:  [id number]          PATIENT TYPE:  INP   LOCATION:  5511                         FACILITY:  [facility]   PHYSICIAN:  C. Ulyess Mort, M.D.DATE OF BIRTH:  1965-08-12   DATE OF ADMISSION:  11/24/2006  DATE OF DISCHARGE:  11/29/2006                               DISCHARGE SUMMARY   DISCHARGE DIAGNOSES:  1. Nausea and vomiting secondary to diabetic ketoacidosis.  2. Hematemesis with history of severe erosive esophagitis.  3. 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  4. Anemia.  5. Hypertension.  6. Hyperlipidemia.  7. Polysubstance abuse.  8. Chronic renal insufficiency.   DISCHARGE MEDICATIONS:  1. Protonix 40 mg by mouth twice daily.  2. Lisinopril 10 mg by mouth once daily.  3. Neurontin 300 mg by mouth three times daily.  4. Novolin 70/30 insulin, inject 31 units each morning and 19 units      each evening.   DISPOSITION AND FOLLOW-UP:  The patient was is to follow up in the [facility] internal medicine outpatient clinic on December 14, 2006, at 2:30  p.m.   ADMISSION HISTORY:  Mr. Manigo is a 45 year old African American gentleman  with a history of type 1 diabetes mellitus with multiple DKAs in the  past, chronic pyelonephritis secondary to a left pyeloureteral junction  obstruction, severe ulcerative esophagitis with an upper GI bleed in  2007, polysubstance abuse, who presented to the [facility] emergency  department in [place] on November 23, 2006, the day prior to admission to  [facility].  He denied fevers, chills, chest pain or shortness of breath  but did endorse a 2-day history of cough.  He reported some nausea with  one episode of bloody emesis the day prior to admission.  He denies  melena or hematochezia and had no abdominal pain.  The patient did  endorse using cocaine and marijuana two days prior to admission.  Of  note, he endorsed polyuria  but no dysuria or increased urinary  frequency.  The patient reported that he had been taking his insulin but  kept it stored outside in his car where he had been living for the past  3 months, as he is homeless.   ADMISSION PHYSICAL:  VITAL SIGNS ON ADMISSION:  Temperature 98.9 degrees  Fahrenheit, blood pressure 110/67, pulse 103, respiration rate 20,  oxygen saturation 100% on room air.  GENERAL:  Somnolent but responsive and in no acute distress.  HEENT:  Pupils equal, round, regular and reactive to light and  accommodation, extraocular movements intact.  Sclerae anicteric.  Oropharynx clear, pink and moist.  NECK:  Supple without lymphadenopathy or thyromegaly.  CARDIOVASCULAR:  Regular rate and rhythm without murmurs, rubs or  gallops.  RESPIRATORY:  Clear to auscultation bilaterally without wheezes, rales  or rhonchi.  Good air movement.  GASTROINTESTINAL:  Soft, nontender, nondistended, no masses.  EXTREMITIES:  No clubbing, cyanosis, or edema.  SKIN:  No rashes or suspicious lesions.  NEUROLOGIC:  Somnolent but no apparent focal deficits.  Cranial nerves  II-XII intact.  Sensation and strength intact bilaterally.  PSYCHIATRIC:  Somnolent but responsive.   ADMISSION LABS:  Laboratory studies on admission initially revealed:  Sodium 120, potassium 7.5, chloride 86, bicarbonate 17, BUN 66,  creatinine 2.02, glucose 911, calcium 8.7.  AST 20, ALT 21, bilirubin  1.5, protein 5.7, albumin 2.8.  Hemoglobin 9.1, hematocrit 27.9, white  blood cell count 23.3 with an ANC of 21.4, platelet count 480.  Note  that the MCV was 89 and the RDW was 14.6.  A urine drug screen was  positive for cocaine and tetrahydrocannabinol.  An alcohol level was  less than 5.  Point of care cardiac markers were negative x2.  Urinalysis revealed greater than 1000 urine glucose with trace blood.  Urine microscopy was not revealing.   PROCEDURES:  1. Serial electrocardiograms performed on August 15 and  November 24, 2006, demonstrated sinus tachycardia with left axis deviation.  2. A portable chest film dated November 24, 2006, demonstrated no active      disease.   HOSPITAL COURSE:  Problem 1.  TYPE 1 DIABETES MELLITUS WITH DIABETIC KETOACIDOSIS:  On  admission the patient complained of nausea and vomiting of 2 days'  duration after living in his car, as he is homeless.  He did report  taking insulin during that time but says that the insulin had not been  refrigerated.  He was seen at the [facility] ED one day prior to  admission, was given IV fluids and instructed to go to the outpatient  clinic.  He returned to the [facility] ED the following day on November 23, 2006, complaining of persistent nausea, vomiting and a new episode  of hematemesis.  Of note, the patient's electrolytes were significantly  abnormal on admission with a sodium of 120, a potassium of 7.5, a  bicarbonate of 17, and a glucose of 911.  The patient was initially  begun on an insulin drip with normal saline to manage his acute  hyperglycemia in the setting of diabetic ketoacidosis.  Plans were made  to restart D5 normal saline when the patient's capillary blood glucose  levels dropped to below 250 as he remained n.p.o. for the first several  days of admission.  In addition to rapid fluid repletion at 250 mL/hr.  of normal saline and an insulin drip, cardiac enzymes were checked,  BMETs were checked every 4 hours and CBGs were checked every 2 hours.  Notably, the patient's anion gap began to close very quickly within the  first 24 hours following admission.  By approximately 24 hours following  admission, the patient's sodium was 125, potassium was 4.1, and  bicarbonate was 22, for an anion gap of 7.  At that time he was  transitioned to subcutaneous insulin per his home regimen.  The most  likely reason for this episode of diabetic ketoacidosis is  unrefrigerated insulin.  The patient was counseled on how to   appropriately store his insulin, though part of his difficulty in  storing his insulin was due to homelessness, so social work was also  consulted.  Of note, the patient responded very quickly and well to  subcutaneous insulin and his blood glucose levels were brought to  reasonable control by the day of discharge.  The patient's hemoglobin  A1c on November 24, 2006, was 10.2%.  The risks of not using his insulin  properly were discussed with the patient.   Problem 2.  ANEMIA:  The patient's hemoglobin dropped from 14.6 g/dl the  evening prior to admission to 7.9 g/dl approximately 24 hours later.  Vigorous rehydration was continued and the patient was typed and crossed  and transfused with 2 units of packed red blood cells.  A  gastroenterology consult was obtained and per their recommendations, an  endoscopy was not performed given the absence of a clear acute bleed.  Given the patient's history of recurrent erosive esophagitis, this was  considered the likely cause though the patient did not have any obvious  episodes of bleeding during this hospitalization so urgent endoscopy was  not indicated per the gastroenterology team.  Of note, the patient  required a total of 4 units of packed red blood cells with one  transfusion on November 24, 2006, and the second transfusion on November 26, 2006.  He did not demonstrate any evidence of active bleeding during  this hospitalization and by the time of discharge, his hemoglobin had  stabilized at 10.7 g/dl.   Problem 3.  ACUTE RENAL FAILURE:  Of note, the patient's elevated  creatinine normalized well during this hospitalization.  This suggests  that at least a significant portion of his increased creatinine was  secondary to prerenal azotemia.  By discharge the patient's BUN and  creatinine had fallen to 19 and 1.29, respectively.   Problem 4.  HYPERLIPIDEMIA:  History of hyperlipidemia is noted in  records but the patient's LDL on this  admission was noted to be normal  at 53.  Also of note, the patient's HDL was 38, his total cholesterol  was 104, and triglycerides were 64.   Problem 5.  HISTORY OF HYPERTENSION:  The patient has a documented  history of hypertension but did not require any antihypertensive  medications during this hospitalization.  Recommend follow-up as an  outpatient.   Problem 6.  POLYSUBSTANCE ABUSE:  Unclear what role, if any, the  patient's history and recent use of cocaine and marijuana had to do with  his hospitalization.  Appreciate social work involvement in securing the  patient application for Medicaid, transportation and a new source of  housing.  The patient reported that he will be living with family until  his electricity is turned back on at his home.  We did discuss with him  the importance of storing his insulin in a refrigerated location at his  home or his family's home.   DISCHARGE LABS:  Laboratory studies on the day of discharge revealed:  Sodium 137, potassium 4.0, chloride 98, bicarbonate 32, BUN 19,  creatinine 1.29, glucose 188, calcium 9.0.  Hemoglobin 10.7, hematocrit  31.9, with an MCV of 88.1 and an RDW of 15.7, white blood cell count  10.0, platelet count 429.   DISCHARGE VITALS:  Vital signs on the day of discharge were:  Temperature 99.1 degrees Fahrenheit, blood pressure 124/74, pulse 87,  respiration rate 20, oxygen saturation 100% on room air.      Madelaine Etienne, MD  Electronically Signed      C. Ulyess Mort, M.D.  Electronically Signed    JH/MEDQ  D:  02/05/2007  T:  02/05/2007  Job:  [id number]

## 2010-08-29 ENCOUNTER — Ambulatory Visit (HOSPITAL_COMMUNITY)
Admission: RE | Admit: 2010-08-29 | Discharge: 2010-08-29 | Disposition: A | Discharge status: Home / Self Care | Payer: Self-pay | Source: Ambulatory Visit | Attending: Orthopedic Surgery | Admitting: Orthopedic Surgery

## 2010-08-29 ENCOUNTER — Encounter (HOSPITAL_COMMUNITY)
Admission: RE | Admit: 2010-08-29 | Discharge: 2010-08-29 | Disposition: A | Discharge status: Home / Self Care | Payer: Self-pay | Source: Ambulatory Visit | Attending: Orthopedic Surgery | Admitting: Orthopedic Surgery

## 2010-08-29 ENCOUNTER — Other Ambulatory Visit (HOSPITAL_COMMUNITY): Payer: Self-pay | Admitting: Orthopedic Surgery

## 2010-08-29 DIAGNOSIS — Z01810 Encounter for preprocedural cardiovascular examination: Secondary | ICD-10-CM | POA: Insufficient documentation

## 2010-08-29 DIAGNOSIS — L089 Local infection of the skin and subcutaneous tissue, unspecified: Secondary | ICD-10-CM

## 2010-08-29 DIAGNOSIS — S98119A Complete traumatic amputation of unspecified great toe, initial encounter: Secondary | ICD-10-CM | POA: Insufficient documentation

## 2010-08-29 DIAGNOSIS — Z01818 Encounter for other preprocedural examination: Secondary | ICD-10-CM | POA: Insufficient documentation

## 2010-08-29 DIAGNOSIS — Z01812 Encounter for preprocedural laboratory examination: Secondary | ICD-10-CM | POA: Insufficient documentation

## 2010-08-29 DIAGNOSIS — M7730 Calcaneal spur, unspecified foot: Secondary | ICD-10-CM | POA: Insufficient documentation

## 2010-08-29 LAB — CBC
HCT: 34.8 % — ABNORMAL LOW (ref 39.0–52.0)
Hemoglobin: 11.6 g/dL — ABNORMAL LOW (ref 13.0–17.0)
MCH: 29.4 pg (ref 26.0–34.0)
MCHC: 33.3 g/dL (ref 30.0–36.0)
MCV: 88.1 fL (ref 78.0–100.0)
Platelets: 350 10*3/uL (ref 150–400)
RBC: 3.95 MIL/uL — ABNORMAL LOW (ref 4.22–5.81)
RDW: 13.2 % (ref 11.5–15.5)
WBC: 6.6 10*3/uL (ref 4.0–10.5)

## 2010-08-29 LAB — DIFFERENTIAL
Basophils Relative: 1 % (ref 0–1)
Eosinophils Absolute: 0.2 10*3/uL (ref 0.0–0.7)
Monocytes Absolute: 0.5 10*3/uL (ref 0.1–1.0)
Monocytes Relative: 8 % (ref 3–12)

## 2010-08-29 LAB — COMPREHENSIVE METABOLIC PANEL
Alkaline Phosphatase: 128 U/L — ABNORMAL HIGH (ref 39–117)
BUN: 11 mg/dL (ref 6–23)
Calcium: 9.2 mg/dL (ref 8.4–10.5)
Glucose, Bld: 252 mg/dL — ABNORMAL HIGH (ref 70–99)
Potassium: 4.1 mEq/L (ref 3.5–5.1)
Total Protein: 6.7 g/dL (ref 6.0–8.3)

## 2010-08-29 LAB — SURGICAL PCR SCREEN
MRSA, PCR: NEGATIVE
Staphylococcus aureus: POSITIVE — AB

## 2010-08-29 LAB — PROTIME-INR: Prothrombin Time: 13.3 seconds (ref 11.6–15.2)

## 2010-08-30 ENCOUNTER — Observation Stay (HOSPITAL_COMMUNITY)
Admission: RE | Admit: 2010-08-30 | Discharge: 2010-08-31 | Disposition: A | Discharge status: Home / Self Care | Payer: Self-pay | Source: Ambulatory Visit | Attending: Orthopedic Surgery | Admitting: Orthopedic Surgery

## 2010-08-30 DIAGNOSIS — Z01812 Encounter for preprocedural laboratory examination: Secondary | ICD-10-CM | POA: Insufficient documentation

## 2010-08-30 DIAGNOSIS — Z01810 Encounter for preprocedural cardiovascular examination: Secondary | ICD-10-CM | POA: Insufficient documentation

## 2010-08-30 DIAGNOSIS — N189 Chronic kidney disease, unspecified: Secondary | ICD-10-CM | POA: Insufficient documentation

## 2010-08-30 DIAGNOSIS — E119 Type 2 diabetes mellitus without complications: Secondary | ICD-10-CM | POA: Insufficient documentation

## 2010-08-30 DIAGNOSIS — I129 Hypertensive chronic kidney disease with stage 1 through stage 4 chronic kidney disease, or unspecified chronic kidney disease: Secondary | ICD-10-CM | POA: Insufficient documentation

## 2010-08-30 DIAGNOSIS — T8740 Infection of amputation stump, unspecified extremity: Principal | ICD-10-CM | POA: Insufficient documentation

## 2010-08-30 DIAGNOSIS — Y849 Medical procedure, unspecified as the cause of abnormal reaction of the patient, or of later complication, without mention of misadventure at the time of the procedure: Secondary | ICD-10-CM | POA: Insufficient documentation

## 2010-08-30 DIAGNOSIS — Z01818 Encounter for other preprocedural examination: Secondary | ICD-10-CM | POA: Insufficient documentation

## 2010-08-30 DIAGNOSIS — K219 Gastro-esophageal reflux disease without esophagitis: Secondary | ICD-10-CM | POA: Insufficient documentation

## 2010-08-30 LAB — GLUCOSE, CAPILLARY: Glucose-Capillary: 160 mg/dL — ABNORMAL HIGH (ref 70–99)

## 2010-08-31 ENCOUNTER — Other Ambulatory Visit: Payer: Self-pay | Admitting: Orthopedic Surgery

## 2010-08-31 LAB — GLUCOSE, CAPILLARY
Glucose-Capillary: 204 mg/dL — ABNORMAL HIGH (ref 70–99)
Glucose-Capillary: 251 mg/dL — ABNORMAL HIGH (ref 70–99)

## 2010-09-11 ENCOUNTER — Emergency Department (HOSPITAL_COMMUNITY): Payer: Self-pay

## 2010-09-11 ENCOUNTER — Encounter: Payer: Self-pay | Admitting: Internal Medicine

## 2010-09-11 ENCOUNTER — Observation Stay (HOSPITAL_COMMUNITY)
Admission: EM | Admit: 2010-09-11 | Discharge: 2010-09-13 | Disposition: A | Discharge status: Home / Self Care | Payer: Self-pay | Attending: Internal Medicine | Admitting: Internal Medicine

## 2010-09-11 DIAGNOSIS — E1165 Type 2 diabetes mellitus with hyperglycemia: Secondary | ICD-10-CM

## 2010-09-11 DIAGNOSIS — S98119A Complete traumatic amputation of unspecified great toe, initial encounter: Secondary | ICD-10-CM | POA: Insufficient documentation

## 2010-09-11 DIAGNOSIS — E1169 Type 2 diabetes mellitus with other specified complication: Secondary | ICD-10-CM

## 2010-09-11 DIAGNOSIS — Y835 Amputation of limb(s) as the cause of abnormal reaction of the patient, or of later complication, without mention of misadventure at the time of the procedure: Secondary | ICD-10-CM | POA: Insufficient documentation

## 2010-09-11 DIAGNOSIS — L97509 Non-pressure chronic ulcer of other part of unspecified foot with unspecified severity: Secondary | ICD-10-CM

## 2010-09-11 DIAGNOSIS — E119 Type 2 diabetes mellitus without complications: Secondary | ICD-10-CM | POA: Insufficient documentation

## 2010-09-11 DIAGNOSIS — K219 Gastro-esophageal reflux disease without esophagitis: Secondary | ICD-10-CM | POA: Insufficient documentation

## 2010-09-11 DIAGNOSIS — E785 Hyperlipidemia, unspecified: Secondary | ICD-10-CM | POA: Insufficient documentation

## 2010-09-11 DIAGNOSIS — N189 Chronic kidney disease, unspecified: Secondary | ICD-10-CM | POA: Insufficient documentation

## 2010-09-11 DIAGNOSIS — I129 Hypertensive chronic kidney disease with stage 1 through stage 4 chronic kidney disease, or unspecified chronic kidney disease: Secondary | ICD-10-CM | POA: Insufficient documentation

## 2010-09-11 DIAGNOSIS — T8789 Other complications of amputation stump: Principal | ICD-10-CM | POA: Insufficient documentation

## 2010-09-11 LAB — CBC
HCT: 33.4 % — ABNORMAL LOW (ref 39.0–52.0)
Hemoglobin: 11.2 g/dL — ABNORMAL LOW (ref 13.0–17.0)
MCV: 87.9 fL (ref 78.0–100.0)
RBC: 3.8 MIL/uL — ABNORMAL LOW (ref 4.22–5.81)
WBC: 7.6 10*3/uL (ref 4.0–10.5)

## 2010-09-11 LAB — BASIC METABOLIC PANEL
BUN: 13 mg/dL (ref 6–23)
CO2: 28 mEq/L (ref 19–32)
Chloride: 100 mEq/L (ref 96–112)
GFR calc non Af Amer: 51 mL/min — ABNORMAL LOW (ref >60)
Glucose, Bld: 424 mg/dL — ABNORMAL HIGH (ref 70–99)
Potassium: 3.7 mEq/L (ref 3.5–5.1)
Sodium: 136 mEq/L (ref 135–145)

## 2010-09-11 LAB — DIFFERENTIAL
Basophils Absolute: 0 10*3/uL (ref 0.0–0.1)
Eosinophils Relative: 2 % (ref 0–5)
Lymphocytes Relative: 33 % (ref 12–46)
Lymphs Abs: 2.5 10*3/uL (ref 0.7–4.0)
Neutro Abs: 4.6 10*3/uL (ref 1.7–7.7)

## 2010-09-11 NOTE — Progress Notes (Signed)
Hospital Admission Note Date: 09/11/2010  Patient name: [name] Medical record number: [id number] Date of birth: 1965/05/04 Age: 45 y.o. Gender: male PCP: VEGA-CASASNOVAS,RAMESES, MD  Medical Service: IMTS  Attending physician:  Dr. Anderson Malta   Pager: Resident [phone]): Dr. Gilford Rile    Pager: [phone] Resident (R1): Dr. 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 Pt had the amputation of the left great toe 2/2 osteomyelitis in June 15 2010, with subsequent revision on Aug 30, 2010 after the wound was complicated by infection. According to the patient, he has been on Doxycycline since then but has not followed up with Ortho in the outpatient setting due to lack of transportation. Today he comes in giving a history of increased drainage and odor over the past 3 days. He noticed the stiches opened up about a week ago. He denies having any fever, chills, sob, cp, palpitations, n/v, or any other systemic symptoms. He also states that he has not been putting on weight on the affected leg. He denies any recent trauma to the area as well.   Current Outpatient Prescriptions  Medication Sig Dispense Refill  . Blood Glucose Monitoring Suppl (BLOOD GLUCOSE METER) kit Meter for glucose testing based on formulary and preference       . gabapentin (NEURONTIN) 300 MG capsule Take 1 capsule (300 mg total) by mouth 2 (two) times daily.  60 capsule  3  . glucose blood (BLOOD GLUCOSE TEST STRIPS) test strip Check blood sugar 4 times a day, before meals and at bedtime.       . hydrochlorothiazide 25 MG tablet Take 1 tablet (25 mg total) by mouth daily.  30 tablet  3  . insulin aspart protamine-insulin aspart (NOVOLOG MIX  70/30) (70-30) 100 UNIT/ML injection Subcutaneously inject 35 units in the morning and 35 units before the evening meal  10 mL  6  . Insulin Syringes, Disposable, U-100 1 ML MISC Use to inject insulin twice a day       . Lancets MISC Check blood sugar 4 times a day, before meals and at bedtime       . lisinopril (PRINIVIL,ZESTRIL) 40 MG tablet Take 1 tablet (40 mg total) by mouth daily.  30 tablet  3  . pantoprazole (PROTONIX) 40 MG tablet Take 40 mg by mouth 2 (two) times daily.          Allergies: Review of patient's allergies indicates no known allergies.  Past Medical History  Diagnosis Date  . Onychomycosis   . Hypertension   . 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 Diabetic peripheral neuropathy   . Chronic renal insufficiency   . Diabetic foot ulcer 12/09    left plantar area  . Diabetes mellitus   . History of drug abuse     Cocaine and marijuana  . Tobacco abuse   . Exposure to trichomonas     treated empirically  . Cellulitis of left foot 03/2008    left 4th and 5th metatarsal area  . Hyperlipidemia   . 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 Ulcerative esophagitis 2007    severe, complicated with UGI bleed  . Mallory Janann August tear April 2009  .  History of prolonged Q-T interval on ECG   . 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    Past Surgical History  Procedure Date  . Nephrectomy 7/10    left   Social History: He is currently unemployed and unable to afford medications or transportation to his clinic appointments. He currently smokes 1/2ppd of cigarettes. He also smokes THC. He denies any current alcohol use.   Review of Systems: Pertinent items are noted in HPI.  Physical Exam: Vitals: t 98.5, bp 151/88, p 86, rr 16, o2 sat 98%RA General appearance: alert, cooperative and no distress Neck: no carotid bruit, no JVD and supple, symmetrical, trachea midline Lungs: clear to  auscultation bilaterally Heart: regular rate and rhythm, S1, S2 normal, no murmur, click, rub or gallop Abdomen: soft, non-tender; bowel sounds normal; no masses,  no organomegaly Extremities: LLE: there is a ~4cm area of incision at the stump with dehicense. There is mild drainage of serous appearing material, malodorous. Mild warmth or erythema Pulses: 2+ and symmetric Neurologic: Alert and oriented X 3, normal strength and tone. Normal symmetric reflexes. Normal coordination and gait  Lab results: CBC:    Component Value Date/Time   WBC 7.6 09/11/2010 1622   HGB 11.2* 09/11/2010 1622   HCT 33.4* 09/11/2010 1622   PLT 387 09/11/2010 1622   MCV 87.9 09/11/2010 1622   NEUTROABS 4.6 09/11/2010 1622   LYMPHSABS 2.5 09/11/2010 1622   MONOABS 0.4 09/11/2010 1622   EOSABS 0.2 09/11/2010 1622   BASOSABS 0.0 09/11/2010 1622     Basic Metabolic Panel:    Component Value Date/Time   NA 136 09/11/2010 1622   K 3.7 09/11/2010 1622   CL 100 09/11/2010 1622   CO2 28 09/11/2010 1622   BUN 13 09/11/2010 1622   CREATININE 1.48 09/11/2010 1622   GLUCOSE 424* 09/11/2010 1622   CALCIUM 9.0 09/11/2010 1622    Imaging results:  LEFT FOOT - COMPLETE 3+ VIEW    Comparison: 08/29/2010.    Findings: Great toe transmetatarsal amputation.  Healing fractures   and heterotopic bone around the distal second third metatarsals.   Laxity of the Lisfranc joint is present with lateral displacement   of the second through fifth metatarsal bases.  The margins of the   first transmetatarsal amputation remain defined, suggesting that no   infection is present in this location.  Plantar calcaneal spur.   Soft tissue swelling at the amputation site.    IMPRESSION:   No convincing plain film evidence of osteomyelitis.  First   transmetatarsal amputation.  Healing second and third metatarsal   fractures.  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   Assessment & Plan by Problem:  1. Non healing left great toe stump wound - continues to  heal poorly likely 2/2 poorly controlled DM2 as well as lack of proper outpatient follow-up due to the patient being unable to afford transportation. No evidence for osteomyelitis on xray although not sensitive. Not concerning for cellulitis on clinical exam. - admit to regular floor for IV abx administration - Will start on Vanc and Zosyn for pseudomonas coverage, pending evaluation by ortho - IVF- NS @ 125cc/hr - ortho consult in the am for re-evaluation - follow wound cultures - needs tighter diabetes control - Vicodin for pain prn  2. DM2 - poorly controlled- A1C 12.6 in Feb 2012 - appears to be very insulin sensitive (CBG dropped from the high 400s to 74 after 8 units of Novolog) -  for now, will start on SSI coverage, insulin to be titrated based on CBGs, hold 70/30 for now as well - f/u Hb A1C - needs more aggressive outpatient control  3. Chronic Kidney Disease: Baseline 1.4- 1.6 - Cr currently at baseline - cont IVF as above - f/u am Bmet - can restart ACEi in the am if HTN not poorly controlled given his DM  4. HTN: patient's BP is 151/88 on admission - restart Amlodipine and HCTZ - consider restarting ACEi as needed   5. GERD:  - continue Protonix  6. Hyperlipidemia: - 05/20/10 FLP was Tchol 168, Tgy 105, HDL 54, LDL 86.  - Considering patient's uncontrolled diabetes, starting statin therapy could be helpful.   7. VTE prophylaxis:  - Heparin

## 2010-09-12 DIAGNOSIS — L97509 Non-pressure chronic ulcer of other part of unspecified foot with unspecified severity: Secondary | ICD-10-CM

## 2010-09-12 DIAGNOSIS — E1165 Type 2 diabetes mellitus with hyperglycemia: Secondary | ICD-10-CM

## 2010-09-12 DIAGNOSIS — E1169 Type 2 diabetes mellitus with other specified complication: Secondary | ICD-10-CM

## 2010-09-12 LAB — BASIC METABOLIC PANEL
BUN: 13 mg/dL (ref 6–23)
CO2: 24 mEq/L (ref 19–32)
Calcium: 8.2 mg/dL — ABNORMAL LOW (ref 8.4–10.5)
Glucose, Bld: 303 mg/dL — ABNORMAL HIGH (ref 70–99)
Sodium: 135 mEq/L (ref 135–145)

## 2010-09-12 LAB — CBC
Hemoglobin: 10.2 g/dL — ABNORMAL LOW (ref 13.0–17.0)
MCH: 29.1 pg (ref 26.0–34.0)
MCHC: 33.1 g/dL (ref 30.0–36.0)
RDW: 13.1 % (ref 11.5–15.5)

## 2010-09-12 LAB — GLUCOSE, CAPILLARY
Glucose-Capillary: 300 mg/dL — ABNORMAL HIGH (ref 70–99)
Glucose-Capillary: 147 mg/dL — ABNORMAL HIGH (ref 70–99)
Glucose-Capillary: 169 mg/dL — ABNORMAL HIGH (ref 70–99)

## 2010-09-12 LAB — LIPID PANEL
HDL: 68 mg/dL (ref >39)
Triglycerides: 109 mg/dL (ref <150)
VLDL: 22 mg/dL (ref 0–40)

## 2010-09-12 LAB — PROTIME-INR
INR: 0.98 (ref 0.00–1.49)
Prothrombin Time: 13.2 seconds (ref 11.6–15.2)

## 2010-09-12 LAB — HEMOGLOBIN A1C: Hgb A1c MFr Bld: 11.8 % — ABNORMAL HIGH (ref <5.7)

## 2010-09-13 DIAGNOSIS — E1169 Type 2 diabetes mellitus with other specified complication: Secondary | ICD-10-CM

## 2010-09-13 DIAGNOSIS — L97509 Non-pressure chronic ulcer of other part of unspecified foot with unspecified severity: Secondary | ICD-10-CM

## 2010-09-13 DIAGNOSIS — E1165 Type 2 diabetes mellitus with hyperglycemia: Secondary | ICD-10-CM

## 2010-09-13 LAB — GLUCOSE, CAPILLARY: Glucose-Capillary: 387 mg/dL — ABNORMAL HIGH (ref 70–99)

## 2010-09-13 LAB — CBC
MCH: 28.9 pg (ref 26.0–34.0)
MCHC: 33 g/dL (ref 30.0–36.0)
MCV: 87.6 fL (ref 78.0–100.0)
Platelets: 383 10*3/uL (ref 150–400)
RBC: 3.94 MIL/uL — ABNORMAL LOW (ref 4.22–5.81)

## 2010-09-13 LAB — BASIC METABOLIC PANEL
BUN: 16 mg/dL (ref 6–23)
Calcium: 9.1 mg/dL (ref 8.4–10.5)
Chloride: 97 mEq/L (ref 96–112)
Creatinine, Ser: 1.32 mg/dL (ref 0.4–1.5)

## 2010-09-14 LAB — WOUND CULTURE

## 2010-09-15 LAB — CULTURE, BLOOD (ROUTINE X 2)

## 2010-09-18 LAB — CULTURE, BLOOD (ROUTINE X 2)
Culture  Setup Time: 201206040842
Culture: NO GROWTH

## 2010-09-19 LAB — CBC
Hemoglobin: 11.7 g/dL — ABNORMAL LOW (ref 13.0–17.0)
MCH: 29 pg (ref 26.0–34.0)
MCV: 89.8 fL (ref 78.0–100.0)
RBC: 4.03 MIL/uL — ABNORMAL LOW (ref 4.22–5.81)

## 2010-09-19 LAB — DIFFERENTIAL
Eosinophils Absolute: 0.2 10*3/uL (ref 0.0–0.7)
Eosinophils Relative: 3 % (ref 0–5)
Lymphs Abs: 2.3 10*3/uL (ref 0.7–4.0)
Monocytes Relative: 10 % (ref 3–12)

## 2010-09-19 NOTE — Progress Notes (Signed)
Addended by: Bufford Spikes on: 09/19/2010 02:13 PM   Modules accepted: Orders

## 2010-09-21 NOTE — Op Note (Signed)
  [name], [name]                 ACCOUNT NO.:  [id number]  MEDICAL RECORD NO.:  [id number]           PATIENT TYPE:  O  LOCATION:  5033                         FACILITY:  [facility]  PHYSICIAN:  Burnard Bunting, M.D.    DATE OF BIRTH:  [birth year]  DATE OF PROCEDURE:  08/30/2010 DATE OF DISCHARGE:  08/29/2010                              OPERATIVE REPORT   PREOPERATIVE DIAGNOSIS:  Left toe soft tissue infection of the great toe stump.  POSTOPERATIVE DIAGNOSIS:  Left toe soft tissue infection of the great toe stump.  PROCEDURE:  Revision left great toe amputation to include metatarsal head.  SURGEON:  Burnard Bunting, MD  ASSISTANT:  None.  ANESTHESIA:  General endotracheal.  ESTIMATED BLOOD LOSS:  25 mL.  DRAINS:  None.  INDICATIONS:  [name] is a patient with left toe infection.  He has a nonhealing stump with drainage, presents now for operative management after explanation of risks and benefits with revision of MTP joint amputation to a partial ray amputation.  PROCEDURE IN DETAIL:  The patient was brought to the operating room where general endotracheal anesthesia was induced.  Preoperative antibiotics were administered.  Left foot was scrubbed with Hibiclens and saline, draped in sterile manner.  Time-out was called.  Ankle Esmarch was utilized.  Longitudinal incision along the anterolateral margin of the first metatarsal shaft was made.  Skin and subcutaneous tissues were sharply divided.  Soft tissue was elevated off the metatarsal and metatarsal head.  Nonhealing tissue and devitalized tissue was then debrided and metatarsal amputation was made and beveled posteriorly.  This was sent to Pathology.  Skin and subcutaneous tissue was then devitalized.  Tissue was then debrided.  The tourniquet was released.  Bleeding points encountered and controlled using electrocautery.  The flaps were closed using 3-0 Vicryl suture and 3-0 nylon suture.  Well-padded bulky splint  was applied.  The patient tolerated the procedure well without immediate complications.    Burnard Bunting, M.D.    GSD/MEDQ  D:  08/30/2010  T:  08/31/2010  Job:  [id number]  Electronically Signed by Reece Agar.  Melady Chow M.D. on 09/21/2010 03:20:04 PM

## 2010-09-22 ENCOUNTER — Encounter: Payer: Self-pay | Admitting: Internal Medicine

## 2010-09-26 NOTE — Discharge Summary (Signed)
[name], [name]                 ACCOUNT NO.:  [id number]  MEDICAL RECORD NO.:  [id number]  LOCATION:  5527                         FACILITY:  [facility]  PHYSICIAN:  Edsel Petrin, D.O.DATE OF BIRTH:  02-12-1966  DATE OF ADMISSION:  09/11/2010 DATE OF DISCHARGE:  09/13/2010                              DISCHARGE SUMMARY   DISCHARGE DIAGNOSES: 1. Nonhealing left greater toe stump wound. 2. Poorly controlled diabetes with A1c of 11.8. 3. Chronic kidney disease with baseline creatinine 1.4-1.6. 4. Hypertension. 5. Gastroesophageal reflux disease. 6. Hyperlipidemia. 7. History of right great toe amputation and left great toe amputation     in March and revision in May 2012, this was complicated with     reinfection.  DISCHARGE MEDICATIONS: 1. Doxycycline 100 mg by mouth twice daily for 10 days. 2. Sulfasalazine 1% cream topically daily. 3. Insulin 70/30 35 units subcutaneously daily with supper. 4. Tylenol 325 mg 1-2 tablets by mouth every 6 hours as needed. 5. Amlodipine 10 mg tablets by mouth daily. 6. Gabapentin 300 mg 1 tablet by mouth twice daily. 7. Hydrochlorothiazide 25 mg tablets by mouth daily. 8. Insulin 70/30 40 units daily before breakfast. 9. Lisinopril 40 mg by mouth daily.  DISPOSITION AND FOLLOWUP:  The patient was discharged in good health. Orthopedics saw the patient and decided to discharge him with p.o. doxycycline to follow up with him in their clinic in the coming week. The patient will also have followup arranged at the Outpatient Clinics, there they can adjust the patient's diabetic regimen, compliance to medications, blood pressure and hyperlipidemia.  No specific labs need to be done at that visit.  PROCEDURES PERFORMED:  None.  CONSULTATIONS:  Orthopedics.  BRIEF HISTORY AND PHYSICAL:  The patient is a 45 year old male with poorly-controlled diabetes complicated by left and right great toe osteomyelitis status post amputation of both toes,  also history of chronic renal insufficiency and hypertension presented with nonhealing left greater toe stump wound.  The patient had an amputation of the left great toe secondary to osteomyelitis in June 15, 2010, with subsequent revision on Aug 30, 2010, after the wound was complicated by infection. According to the patient, he has been on doxycycline since then, but has not followed up with Ortho in the outpatient setting due to lack of transportation.  On the day of admission, the patient presented with history of increased drainage and odor over 3 days prior to admission. He noticed that stitches were opened the week prior to admission.  He denied having any fevers, chills, shortness of breath, chest pain, palpitation, nausea, vomiting, or any other systemic complaints.  At that time, he stated that he was not able to put weight on the affected leg.  He denied any recent trauma to the area as well.  PHYSICAL EXAMINATION:  VITAL SIGNS:  Temperature 98.5, blood pressure 151/88, pulse 88, respiratory rate 16, O2 sat 98% on room air. GENERAL:  Alert, cooperative to exam.  No distress. NECK:  No bruits.  No JVD, supple.  Trachea midline. LUNGS:  Clear to auscultation bilaterally. HEART:  Regular rate and rhythm.  No murmurs, rubs, or gallops. ABDOMEN:  Soft, nontender, normal bowel  sounds.  No masses, no organomegaly. EXTREMITIES:  Lower extremity has 4-cm incision at the stump with dehiscence, there was mild drainage of serous or purulent material, malodorous, mildly warm and erythematous.  Pulses 2+ and symmetric. NEUROLOGICAL:  Alert and oriented x3.  Normal strength and tone.  Normal symmetric reflexes.  Normal coordination and gait.  ADMISSION LABORATORY DATA:  White blood cell count 7.6, hemoglobin 11.2, hematocrit 33.4, platelet count 387.  Sodium 136, potassium 3.7, chloride 100, bicarb 28, BUN 13, creatinine 1.48, glucose 424.  HOSPITAL COURSE BY PROBLEM: 1. Nonhealing left  greater toe stump wound, this wound was felt to be     feeling poorly secondary to uncontrolled diabetes as well as lack     of proper outpatient followup as the patient was claiming that he     did not have adequate transportation.  At the time of admission,     there was no evidence of osteomyelitis.  X-ray was done which shows     no evidence of osteomyelitis.  Also physical exam was not     concerning for cellulitis.  The patient was admitted to regular     floor.  IV antibiotics were started initially, broad-spectrum with     vancomycin and Zosyn for Pseudomonal coverage.  The patient was     given normal saline infusion.  Orthopedic surgery was consulted     along with Wound Care and patient was given pain medications for     pain control. Orthopedic surgery were consulted for guidance of     treatment, after evaluation decided that the patient did not need to     be in the hospital as there was no sign of severe infection or     osteomyelitis.  They recommended to continue doxycycline for 10     days and to follow up with him as an outpatient.  The patient was     discharged with a 10-day course of doxycycline. 2. Diabetes, poorly controlled with an A1c of 11.8.  The patient was     restarted on his home dose of insulin 70/30.  The patient will need     aggressive outpatient management and education.  During the     hospital, he received diabetic education.  We will continue to work     very closely with this patient and his diabetes control. 3. Chronic kidney disease, baseline 1.4-1.6.  The patient's creatinine     was at baseline throughout hospitalization.  No acute changes     there. 4. Hypertension.  The patient's blood pressure was 151/88 on     admission.  The patient's amlodipine and hydrochlorothiazide were     restarted, also we consider starting an ACE inhibitor.  On     discharge, the patient's blood pressure was 151/87. 5. Hyperlipidemia.  The patient's cholesterol  was 168, triglyceride     105, HDL 54, LDL was 86, given that the patient is diabetic, he may     benefit from a statin, however, first we will try to get his     diabetes under control before considering adding of statins to his     regimen which the patient is already having difficulty adhering to.  DISCHARGE LABS AND VITALS:  Temperature 97.8, pulse 86, respiratory rate 20, blood pressure 151/87, O2 saturation 100R% on room air.  Wound cultures shows Gram-positive cocci in pairs, moderate group B strep isolated.  Blood cultures x2 were negative.  Sodium 132, potassium  4.5, chloride 97, bicarb 26, glucose 467, BUN 16, creatinine 1.32.  White blood cell count 6.5, hemoglobin 11.4, hematocrit 34.5, platelet count 383, A1c 11.8, cholesterol 160, triglyceride 109, HDL 68, LDL was 70.     Darnelle Maffucci, MD   ______________________________ Edsel Petrin, D.O.    PT/MEDQ  D:  09/13/2010  T:  09/14/2010  Job:  [id number]  cc:   [facility] G. Dorene Grebe, M.D.  Electronically Signed by Darnelle Maffucci  on 09/25/2010 09:01:50 PM Electronically Signed by Anderson Malta D.O. on 09/26/2010 03:34:36 PM

## 2010-10-11 ENCOUNTER — Encounter: Payer: Self-pay | Admitting: Internal Medicine

## 2010-10-11 ENCOUNTER — Ambulatory Visit (INDEPENDENT_AMBULATORY_CARE_PROVIDER_SITE_OTHER): Payer: Self-pay | Admitting: Internal Medicine

## 2010-10-11 VITALS — BP 122/89 | HR 107 | Temp 97.3°F | Ht 68.0 in | Wt 208.4 lb

## 2010-10-11 DIAGNOSIS — I10 Essential (primary) hypertension: Secondary | ICD-10-CM

## 2010-10-11 DIAGNOSIS — F17200 Nicotine dependence, unspecified, uncomplicated: Secondary | ICD-10-CM

## 2010-10-11 DIAGNOSIS — E119 Type 2 diabetes mellitus without complications: Secondary | ICD-10-CM

## 2010-10-11 DIAGNOSIS — K219 Gastro-esophageal reflux disease without esophagitis: Secondary | ICD-10-CM

## 2010-10-11 MED ORDER — GABAPENTIN 300 MG PO CAPS: 300.0000 mg | ORAL_CAPSULE | Freq: Two times a day (BID) | ORAL | Status: DC

## 2010-10-11 MED ORDER — INSULIN SYRINGES (DISPOSABLE) U-100 1 ML MISC: Status: DC

## 2010-10-11 MED ORDER — GLUCOSE BLOOD VI STRP: ORAL_STRIP | Status: DC

## 2010-10-11 MED ORDER — LISINOPRIL 40 MG PO TABS: 40.0000 mg | ORAL_TABLET | Freq: Every day | ORAL | Status: DC

## 2010-10-11 MED ORDER — INSULIN ASPART PROT & ASPART (70-30 MIX) 100 UNIT/ML ~~LOC~~ SUSP: SUBCUTANEOUS | Status: DC

## 2010-10-11 MED ORDER — HYDROCHLOROTHIAZIDE 25 MG PO TABS: 25.0000 mg | ORAL_TABLET | Freq: Every day | ORAL | Status: DC

## 2010-10-11 MED ORDER — PANTOPRAZOLE SODIUM 40 MG PO TBEC: 40.0000 mg | DELAYED_RELEASE_TABLET | Freq: Two times a day (BID) | ORAL | Status: DC

## 2010-10-11 NOTE — Progress Notes (Signed)
Subjective:    Patient ID: [name], male    DOB: 09/04/1965, 45 y.o.   MRN: [id number]  HPI  # S/p L toe amputation: 45 yo M with uncontrolled DM, s/p L great toe amputation in March, revision (infxn) in May, and hospitalization in early June for continued infxn.  Was d/c'ed on PO abx and instructed to f/u.  Pt reports toe wound has started to heal nicely.  Still open, but slowly closing and no s/s of infxn.  Pt has ortho f/u this Thursday 7/5.  Keeps wound wrapped and treats with silvadene daily.  #DMII: pt lives 2 hour walk from nearest bus stop and has extremely limited income/money.  Obtaining medication is challenging, but pt is compliant when he has meds.  Last a1c 11.8, 10.9 today in office.  Currently on 70/30 35 units in morning and evening, takes right before meals.  Reports feeling low on occasion, usually when he does not eat enough food following insulin injection.  Pt is running low on insulin but has backup bottle of 70/25.  Still smoking 6-7 cigs/day.  #HTN: compliant with meds, controlled today in office.  As stated above, obtaining meds is biggest challenge for pt.  #CKD: not addressed this visit  #HL: last lipid panel was acceptabe for DMII patient.   Current Outpatient Prescriptions on File Prior to Visit  Medication Sig Dispense Refill  . Blood Glucose Monitoring Suppl (BLOOD GLUCOSE METER) kit Meter for glucose testing based on formulary and preference       . gabapentin (NEURONTIN) 300 MG capsule Take 1 capsule (300 mg total) by mouth 2 (two) times daily.  60 capsule  3  . glucose blood (BLOOD GLUCOSE TEST STRIPS) test strip Check blood sugar 4 times a day, before meals and at bedtime.       . hydrochlorothiazide 25 MG tablet Take 1 tablet (25 mg total) by mouth daily.  30 tablet  3  . insulin aspart protamine-insulin aspart (NOVOLOG MIX 70/30) (70-30) 100 UNIT/ML injection Subcutaneously inject 35 units in the morning and 35 units before the evening meal  10 mL  6   . Insulin Syringes, Disposable, U-100 1 ML MISC Use to inject insulin twice a day       . Lancets MISC Check blood sugar 4 times a day, before meals and at bedtime       . lisinopril (PRINIVIL,ZESTRIL) 40 MG tablet Take 1 tablet (40 mg total) by mouth daily.  30 tablet  3  . pantoprazole (PROTONIX) 40 MG tablet Take 40 mg by mouth 2 (two) times daily.         Patient Active Problem List  Diagnoses  . ONYCHOMYCOSIS, TOENAILS  . TINEA CORPORIS  . UNSPECIFIED TRICHOMONIASIS  . DIABETES MELLITUS, TYPE II  . DIABETIC PERIPHERAL NEUROPATHY  . DIABETIC ULCER, LEFT LEG  . TOBACCO ABUSE  . DRUG ABUSE  . HYPERTENSION  . RENAL FAILURE, CHRONIC  . RENAL AND PERINEPHRIC ABSCESS  . TACHYCARDIA  . OTHER NONSPECIFIC FINDING EXAMINATION OF URINE  . Diabetic foot ulcer  . Inadequate material resources   No Known Allergies  Review of Systems As per HPI    Objective:   Physical Exam  HENT:  Head: Normocephalic and atraumatic.  Eyes: EOM are normal.  Cardiovascular: Normal rate, regular rhythm, normal heart sounds and intact distal pulses.   Pulmonary/Chest: Effort normal.       Decreased expiratory sounds  Musculoskeletal:       L  great toe wound: ~2-3 cm of non-healing wound, edges approximated, no erythema or exudate.  Proximal part of wound has healed well.  Good distal pulses.  Foot exam wnl.  B/l 2nd toes each have dime sized ulcers, no exudate.          Assessment & Plan:

## 2010-10-11 NOTE — Assessment & Plan Note (Signed)
Pt con't to smoke 6-7 cigs/day b/c he can't afford more.  Did not express interest in stopping at this appt.

## 2010-10-11 NOTE — Assessment & Plan Note (Signed)
BP in control at todays' visit.  Pt compliant c meds if he can get them.  Refilled rx's and encouaged pt to reapply to MAP and medicaid.

## 2010-10-11 NOTE — Assessment & Plan Note (Addendum)
IDDM: pt a1c trending down over time, but simply having enough insulin remains challenge for this pt.  Seems to take insulin consistently if he has it, but does not check daily.  Checks every 2-3 days, tyipically mid 200's.  Wrote refills for all DM supplies, pt is reapplying for MAP.  Put in ophtho referral using orange card program.  Pt says he will hear about medicare in 1.5 months, which would help his financial situation.  In terms of s/p amputation, will see ortho on Thursday.  Wound appears to be healing--will not advise acute intervention at this time. --refill DM insulin and supplies

## 2010-10-12 NOTE — Progress Notes (Signed)
I saw [name] and discussed the pt with [name]. I agree with his note as outlined here. [name] is motivated to improve his health but lack of finances hinder his ability to make sig improvements. Cannot afford glucose testing supplies so aggressive insulin therapy is limited. Reapplying for orange card, but even then has trouble with co-pays. Will here about medicaid in 90 days.

## 2010-11-29 ENCOUNTER — Telehealth: Payer: Self-pay | Admitting: Dietician

## 2010-12-02 NOTE — Telephone Encounter (Signed)
Have not heard back from patient. Will send letter requesting he call office to schedule an appointment with his doctor

## 2011-01-03 LAB — CBC
HCT: 28.4 — ABNORMAL LOW
HCT: 30.5 — ABNORMAL LOW
Hemoglobin: 10.3 — ABNORMAL LOW
Hemoglobin: 10.6 — ABNORMAL LOW
Hemoglobin: 11.4 — ABNORMAL LOW
Hemoglobin: 9.9 — ABNORMAL LOW
MCHC: 32.2
MCHC: 32.3
MCHC: 32.5
MCHC: 32.6
MCV: 83.3
MCV: 83.7
MCV: 83.8
MCV: 84.2
Platelets: 360
Platelets: 365
Platelets: 449 — ABNORMAL HIGH
RBC: 3.62 — ABNORMAL LOW
RBC: 3.76 — ABNORMAL LOW
RBC: 3.91 — ABNORMAL LOW
RBC: 4.23
RDW: 17.9 — ABNORMAL HIGH
RDW: 18.1 — ABNORMAL HIGH
RDW: 18.1 — ABNORMAL HIGH
RDW: 18.3 — ABNORMAL HIGH
WBC: 10
WBC: 11.8 — ABNORMAL HIGH
WBC: 11.9 — ABNORMAL HIGH
WBC: 13.4 — ABNORMAL HIGH
WBC: 14.7 — ABNORMAL HIGH

## 2011-01-03 LAB — FOLATE: Folate: 9.5

## 2011-01-03 LAB — COMPREHENSIVE METABOLIC PANEL
ALT: 15
ALT: 18
Albumin: 3 — ABNORMAL LOW
Albumin: 3.5
Alkaline Phosphatase: 85
Alkaline Phosphatase: 96
BUN: 32 — ABNORMAL HIGH
Calcium: 8.1 — ABNORMAL LOW
Calcium: 9
GFR calc Af Amer: >60
Glucose, Bld: 295 — ABNORMAL HIGH
Glucose, Bld: 393 — ABNORMAL HIGH
Potassium: 4.4
Potassium: 4.7
Sodium: 128 — ABNORMAL LOW
Sodium: 135
Total Protein: 6.6
Total Protein: 7.2

## 2011-01-03 LAB — CARDIAC PANEL(CRET KIN+CKTOT+MB+TROPI)
CK, MB: 1.7
CK, MB: 1.9
Relative Index: 1.1
Relative Index: 1.1
Total CK: 150

## 2011-01-03 LAB — LIPID PANEL
LDL Cholesterol: 88
Triglycerides: 84
VLDL: 17

## 2011-01-03 LAB — RAPID URINE DRUG SCREEN, HOSP PERFORMED
Amphetamines: NOT DETECTED
Benzodiazepines: NOT DETECTED
Cocaine: NOT DETECTED
Tetrahydrocannabinol: NOT DETECTED

## 2011-01-03 LAB — POCT CARDIAC MARKERS
CKMB, poc: 1.2
Operator id: 146091
Troponin i, poc: <0.05

## 2011-01-03 LAB — URINALYSIS, MICROSCOPIC ONLY
Bilirubin Urine: NEGATIVE
Ketones, ur: 40 — AB
Leukocytes, UA: NEGATIVE
Nitrite: NEGATIVE
Urobilinogen, UA: 0.2

## 2011-01-03 LAB — DIFFERENTIAL
Eosinophils Absolute: 0
Lymphs Abs: 1
Monocytes Absolute: 1.3 — ABNORMAL HIGH
Monocytes Relative: 9
Neutrophils Relative %: 84 — ABNORMAL HIGH

## 2011-01-03 LAB — T4: T4, Total: 6.9

## 2011-01-03 LAB — BASIC METABOLIC PANEL
BUN: 11
Calcium: 8.3 — ABNORMAL LOW
Chloride: 105
Creatinine, Ser: 1.1
GFR calc Af Amer: >60

## 2011-01-03 LAB — IRON AND TIBC
Iron: 187 — ABNORMAL HIGH
TIBC: 270
UIBC: 83

## 2011-01-03 LAB — T3: T3, Total: 64.7 — ABNORMAL LOW (ref 80.0–204.0)

## 2011-01-03 LAB — PROTIME-INR: INR: 1

## 2011-01-03 LAB — RETICULOCYTES: Retic Count, Absolute: 39

## 2011-01-03 LAB — ETHANOL: Alcohol, Ethyl (B): <5

## 2011-01-03 LAB — FERRITIN: Ferritin: 16 — ABNORMAL LOW (ref 22–322)

## 2011-01-05 LAB — POCT I-STAT, CHEM 8
Chloride: 102
HCT: 37 — ABNORMAL LOW
Potassium: 4

## 2011-01-05 LAB — DIFFERENTIAL
Basophils Absolute: 0
Basophils Relative: 0
Lymphocytes Relative: 13
Monocytes Relative: 6
Neutro Abs: 7.5
Neutrophils Relative %: 81 — ABNORMAL HIGH

## 2011-01-05 LAB — URINALYSIS, ROUTINE W REFLEX MICROSCOPIC
Leukocytes, UA: NEGATIVE
Nitrite: NEGATIVE
Specific Gravity, Urine: 1.028
pH: 7

## 2011-01-05 LAB — URINE MICROSCOPIC-ADD ON

## 2011-01-05 LAB — CBC
MCHC: 31.5
RBC: 4.21 — ABNORMAL LOW
RDW: 17.9 — ABNORMAL HIGH

## 2011-01-06 LAB — URINALYSIS, ROUTINE W REFLEX MICROSCOPIC
Glucose, UA: >1000 — AB
Specific Gravity, Urine: 1.037 — ABNORMAL HIGH
Urobilinogen, UA: 0.2
pH: 5.5

## 2011-01-06 LAB — DIFFERENTIAL
Basophils Absolute: 0.1
Basophils Relative: 1
Monocytes Absolute: 0.2
Neutro Abs: 11.2 — ABNORMAL HIGH

## 2011-01-06 LAB — CBC
Hemoglobin: 14.5
MCHC: 31.5
RDW: 20.8 — ABNORMAL HIGH

## 2011-01-06 LAB — HEPATIC FUNCTION PANEL
ALT: 26
AST: 30
Albumin: 4.5
Alkaline Phosphatase: 117
Indirect Bilirubin: 1.3 — ABNORMAL HIGH
Total Protein: 8.7 — ABNORMAL HIGH

## 2011-01-06 LAB — URINE MICROSCOPIC-ADD ON

## 2011-01-06 LAB — BASIC METABOLIC PANEL
CO2: 26
Calcium: 9.6
Creatinine, Ser: 1.55 — ABNORMAL HIGH
Glucose, Bld: 526

## 2011-01-09 LAB — BASIC METABOLIC PANEL
BUN: 10
BUN: 10
BUN: 10
BUN: 11
BUN: 11
BUN: 12
CO2: 22
CO2: 24
CO2: 25
CO2: 26
Calcium: 8.3 — ABNORMAL LOW
Calcium: 8.6
Calcium: 8.7
Chloride: 101
Chloride: 102
Chloride: 97
Chloride: 99
Creatinine, Ser: 1.22
Creatinine, Ser: 1.28
Creatinine, Ser: 1.34
Creatinine, Ser: 1.37
Creatinine, Ser: 1.44
GFR calc Af Amer: >60
GFR calc Af Amer: >60
GFR calc non Af Amer: 58 — ABNORMAL LOW
GFR calc non Af Amer: >60
GFR calc non Af Amer: >60
Glucose, Bld: 139 — ABNORMAL HIGH
Glucose, Bld: 153 — ABNORMAL HIGH
Glucose, Bld: 310 — ABNORMAL HIGH
Glucose, Bld: 368 — ABNORMAL HIGH
Potassium: 3.2 — ABNORMAL LOW
Potassium: 3.9

## 2011-01-09 LAB — GLUCOSE, CAPILLARY
Glucose-Capillary: 149 — ABNORMAL HIGH
Glucose-Capillary: 209 — ABNORMAL HIGH
Glucose-Capillary: 218 — ABNORMAL HIGH
Glucose-Capillary: 237 — ABNORMAL HIGH
Glucose-Capillary: 292 — ABNORMAL HIGH
Glucose-Capillary: 419 — ABNORMAL HIGH
Glucose-Capillary: 463 — ABNORMAL HIGH

## 2011-01-09 LAB — COMPREHENSIVE METABOLIC PANEL
ALT: 17
AST: 20
Albumin: 4
Alkaline Phosphatase: 102
BUN: 14
CO2: 23
Calcium: 9.5
Chloride: 94 — ABNORMAL LOW
Creatinine, Ser: 1.4
GFR calc Af Amer: >60
GFR calc non Af Amer: 56 — ABNORMAL LOW
Glucose, Bld: 366 — ABNORMAL HIGH
Potassium: 4.5
Sodium: 132 — ABNORMAL LOW
Total Bilirubin: 1.6 — ABNORMAL HIGH
Total Protein: 7.6

## 2011-01-09 LAB — CBC
HCT: 36.4 — ABNORMAL LOW
HCT: 42.9
Hemoglobin: 12.1 — ABNORMAL LOW
Hemoglobin: 14.1
MCHC: 32.5
MCHC: 32.9
MCV: 90.4
MCV: 91.5
MCV: 92.4
Platelets: 323
Platelets: 350
RBC: 4.69
RDW: 14.7
RDW: 14.9
RDW: 15
WBC: 9.3

## 2011-01-09 LAB — DIFFERENTIAL
Basophils Absolute: 0.1
Basophils Relative: 1
Eosinophils Absolute: 0.1
Eosinophils Relative: 1
Lymphocytes Relative: 18
Lymphs Abs: 1.6
Monocytes Absolute: 0.5
Monocytes Relative: 5
Neutro Abs: 7
Neutrophils Relative %: 75

## 2011-01-09 LAB — CARDIAC PANEL(CRET KIN+CKTOT+MB+TROPI)
CK, MB: 1.2
Relative Index: INVALID
Troponin I: 0.01

## 2011-01-09 LAB — MAGNESIUM: Magnesium: 1.9

## 2011-01-09 LAB — URINALYSIS, ROUTINE W REFLEX MICROSCOPIC
Bilirubin Urine: NEGATIVE
Glucose, UA: >1000 — AB
Hgb urine dipstick: NEGATIVE
Ketones, ur: 40 — AB
Leukocytes, UA: NEGATIVE
Nitrite: NEGATIVE
Protein, ur: 30 — AB
Specific Gravity, Urine: 1.039 — ABNORMAL HIGH
Urobilinogen, UA: 1
pH: 6

## 2011-01-09 LAB — HIV ANTIBODY (ROUTINE TESTING W REFLEX): HIV: NONREACTIVE

## 2011-01-09 LAB — BLOOD GAS, ARTERIAL
Acid-Base Excess: 0.3
Patient temperature: 98.6
TCO2: 24.7
pCO2 arterial: 33.5 — ABNORMAL LOW
pH, Arterial: 7.463 — ABNORMAL HIGH

## 2011-01-09 LAB — CK TOTAL AND CKMB (NOT AT ARMC): Relative Index: 1.7

## 2011-01-09 LAB — LIPASE, BLOOD: Lipase: 36

## 2011-01-09 LAB — LACTIC ACID, PLASMA: Lactic Acid, Venous: 1.1

## 2011-01-09 LAB — ETHANOL: Alcohol, Ethyl (B): <5

## 2011-01-09 LAB — URINE MICROSCOPIC-ADD ON

## 2011-01-09 LAB — SALICYLATE LEVEL: Salicylate Lvl: <4

## 2011-01-13 LAB — CBC
HCT: 32.5 % — ABNORMAL LOW (ref 39.0–52.0)
Hemoglobin: 11.2 g/dL — ABNORMAL LOW (ref 13.0–17.0)
MCHC: 32.3 g/dL (ref 30.0–36.0)
MCHC: 32.3 g/dL (ref 30.0–36.0)
MCHC: 33.3 g/dL (ref 30.0–36.0)
MCV: 91.1 fL (ref 78.0–100.0)
MCV: 91.5 fL (ref 78.0–100.0)
MCV: 91.8 fL (ref 78.0–100.0)
Platelets: 370 10*3/uL (ref 150–400)
Platelets: 374 10*3/uL (ref 150–400)
Platelets: 397 10*3/uL (ref 150–400)
Platelets: 445 10*3/uL — ABNORMAL HIGH (ref 150–400)
RBC: 3.76 MIL/uL — ABNORMAL LOW (ref 4.22–5.81)
RDW: 13.6 % (ref 11.5–15.5)
RDW: 13.8 % (ref 11.5–15.5)
RDW: 13.9 % (ref 11.5–15.5)
WBC: 11.9 10*3/uL — ABNORMAL HIGH (ref 4.0–10.5)
WBC: 7.4 10*3/uL (ref 4.0–10.5)

## 2011-01-13 LAB — COMPREHENSIVE METABOLIC PANEL
ALT: 13 U/L (ref 0–53)
AST: 19 U/L (ref 0–37)
Alkaline Phosphatase: 106 U/L (ref 39–117)
CO2: 27 mEq/L (ref 19–32)
Calcium: 9 mg/dL (ref 8.4–10.5)
GFR calc Af Amer: >60 mL/min (ref >60)
GFR calc non Af Amer: 51 mL/min — ABNORMAL LOW (ref >60)
Glucose, Bld: 101 mg/dL — ABNORMAL HIGH (ref 70–99)
Potassium: 4.5 mEq/L (ref 3.5–5.1)
Sodium: 141 mEq/L (ref 135–145)

## 2011-01-13 LAB — BASIC METABOLIC PANEL
BUN: 15 mg/dL (ref 6–23)
BUN: 15 mg/dL (ref 6–23)
BUN: 17 mg/dL (ref 6–23)
CO2: 23 mEq/L (ref 19–32)
CO2: 25 mEq/L (ref 19–32)
CO2: 27 mEq/L (ref 19–32)
Calcium: 8.6 mg/dL (ref 8.4–10.5)
Calcium: 8.7 mg/dL (ref 8.4–10.5)
Calcium: 8.9 mg/dL (ref 8.4–10.5)
Chloride: 100 mEq/L (ref 96–112)
Chloride: 107 mEq/L (ref 96–112)
Creatinine, Ser: 1.3 mg/dL (ref 0.4–1.5)
Creatinine, Ser: 1.43 mg/dL (ref 0.4–1.5)
Creatinine, Ser: 1.51 mg/dL — ABNORMAL HIGH (ref 0.4–1.5)
GFR calc Af Amer: >60 mL/min (ref >60)
GFR calc Af Amer: >60 mL/min (ref >60)
GFR calc non Af Amer: 53 mL/min — ABNORMAL LOW (ref >60)
GFR calc non Af Amer: 54 mL/min — ABNORMAL LOW (ref >60)
Glucose, Bld: 314 mg/dL — ABNORMAL HIGH (ref 70–99)
Glucose, Bld: 88 mg/dL (ref 70–99)
Potassium: 3.7 mEq/L (ref 3.5–5.1)
Sodium: 136 mEq/L (ref 135–145)

## 2011-01-13 LAB — GLUCOSE, CAPILLARY
Glucose-Capillary: 105 mg/dL — ABNORMAL HIGH (ref 70–99)
Glucose-Capillary: 113 mg/dL — ABNORMAL HIGH (ref 70–99)
Glucose-Capillary: 135 mg/dL — ABNORMAL HIGH (ref 70–99)
Glucose-Capillary: 142 mg/dL — ABNORMAL HIGH (ref 70–99)
Glucose-Capillary: 197 mg/dL — ABNORMAL HIGH (ref 70–99)
Glucose-Capillary: 201 mg/dL — ABNORMAL HIGH (ref 70–99)
Glucose-Capillary: 219 mg/dL — ABNORMAL HIGH (ref 70–99)
Glucose-Capillary: 219 mg/dL — ABNORMAL HIGH (ref 70–99)
Glucose-Capillary: 252 mg/dL — ABNORMAL HIGH (ref 70–99)
Glucose-Capillary: 264 mg/dL — ABNORMAL HIGH (ref 70–99)
Glucose-Capillary: 272 mg/dL — ABNORMAL HIGH (ref 70–99)
Glucose-Capillary: 286 mg/dL — ABNORMAL HIGH (ref 70–99)
Glucose-Capillary: 306 mg/dL — ABNORMAL HIGH (ref 70–99)
Glucose-Capillary: 320 mg/dL — ABNORMAL HIGH (ref 70–99)
Glucose-Capillary: 370 mg/dL — ABNORMAL HIGH (ref 70–99)
Glucose-Capillary: 68 mg/dL — ABNORMAL LOW (ref 70–99)
Glucose-Capillary: 74 mg/dL (ref 70–99)

## 2011-01-13 LAB — RAPID URINE DRUG SCREEN, HOSP PERFORMED
Barbiturates: NOT DETECTED
Cocaine: POSITIVE — AB
Opiates: NOT DETECTED

## 2011-01-13 LAB — DIFFERENTIAL
Basophils Relative: 0 % (ref 0–1)
Eosinophils Absolute: 0.3 10*3/uL (ref 0.0–0.7)
Eosinophils Relative: 2 % (ref 0–5)
Lymphs Abs: 2.2 10*3/uL (ref 0.7–4.0)
Monocytes Relative: 8 % (ref 3–12)
Neutrophils Relative %: 71 % (ref 43–77)

## 2011-01-13 LAB — VANCOMYCIN, TROUGH: Vancomycin Tr: 11.2 ug/mL (ref 10.0–20.0)

## 2011-01-13 LAB — PROTIME-INR
INR: 1.1 (ref 0.00–1.49)
Prothrombin Time: 14.3 seconds (ref 11.6–15.2)

## 2011-01-13 LAB — GRAM STAIN

## 2011-01-13 LAB — WOUND CULTURE

## 2011-01-13 LAB — TSH: TSH: 1.524 u[IU]/mL (ref 0.350–4.500)

## 2011-01-17 LAB — URINALYSIS, ROUTINE W REFLEX MICROSCOPIC
Bilirubin Urine: NEGATIVE
Bilirubin Urine: NEGATIVE
Bilirubin Urine: NEGATIVE
Glucose, UA: >1000 — AB
Ketones, ur: NEGATIVE
Ketones, ur: NEGATIVE
Nitrite: NEGATIVE
Nitrite: NEGATIVE
Protein, ur: 30 — AB
Protein, ur: NEGATIVE
Specific Gravity, Urine: 1.028
Urobilinogen, UA: 0.2
Urobilinogen, UA: 0.2
pH: 5.5
pH: 6

## 2011-01-17 LAB — BASIC METABOLIC PANEL
BUN: 13
BUN: 18
BUN: 60 — ABNORMAL HIGH
BUN: 8
CO2: 19
CO2: 21
Calcium: 7 — ABNORMAL LOW
Calcium: 7.2 — ABNORMAL LOW
Calcium: 7.9 — ABNORMAL LOW
Chloride: 102
Chloride: 105
Chloride: 107
Chloride: 109
Chloride: 95 — ABNORMAL LOW
Creatinine, Ser: 1.17
Creatinine, Ser: 1.31
Creatinine, Ser: 1.33
GFR calc Af Amer: >60
GFR calc Af Amer: >60
GFR calc Af Amer: >60
GFR calc Af Amer: >60
GFR calc non Af Amer: 59 — ABNORMAL LOW
GFR calc non Af Amer: >60
GFR calc non Af Amer: >60
GFR calc non Af Amer: >60
GFR calc non Af Amer: >60
Glucose, Bld: 128 — ABNORMAL HIGH
Glucose, Bld: 225 — ABNORMAL HIGH
Glucose, Bld: 277 — ABNORMAL HIGH
Glucose, Bld: 345 — ABNORMAL HIGH
Glucose, Bld: 365 — ABNORMAL HIGH
Potassium: 3.9
Potassium: 4
Potassium: 4.1
Potassium: 4.4
Potassium: 4.5
Sodium: 134 — ABNORMAL LOW
Sodium: 135
Sodium: 135
Sodium: 136

## 2011-01-17 LAB — CULTURE, BLOOD (ROUTINE X 2)
Culture: NO GROWTH
Culture: NO GROWTH

## 2011-01-17 LAB — CBC
HCT: 19.1 — ABNORMAL LOW
HCT: 25.2 — ABNORMAL LOW
HCT: 25.3 — ABNORMAL LOW
HCT: 26 — ABNORMAL LOW
HCT: 26.1 — ABNORMAL LOW
HCT: 37.2 — ABNORMAL LOW
Hemoglobin: 6.4 — CL
Hemoglobin: 7.5 — CL
Hemoglobin: 7.9 — CL
Hemoglobin: 8.6 — ABNORMAL LOW
Hemoglobin: 8.6 — ABNORMAL LOW
Hemoglobin: 8.6 — ABNORMAL LOW
Hemoglobin: 8.7 — ABNORMAL LOW
Hemoglobin: 9.4 — ABNORMAL LOW
MCHC: 33.1
MCHC: 33.1
MCHC: 33.7
MCHC: 33.8
MCHC: 34.1
MCHC: 34.2
MCV: 85.1
MCV: 85.2
MCV: 85.3
MCV: 86.2
MCV: 87.7
MCV: 87.7
Platelets: 208
Platelets: 213
Platelets: 241
Platelets: 278
Platelets: 287
Platelets: 366
RBC: 2.25 — ABNORMAL LOW
RBC: 2.59 — ABNORMAL LOW
RBC: 2.75 — ABNORMAL LOW
RBC: 2.94 — ABNORMAL LOW
RBC: 2.97 — ABNORMAL LOW
RDW: 14.5
RDW: 14.7
RDW: 14.8
RDW: 15
RDW: 15.4
RDW: 17 — ABNORMAL HIGH
WBC: 10.5
WBC: 11.3 — ABNORMAL HIGH
WBC: 12.4 — ABNORMAL HIGH
WBC: 17 — ABNORMAL HIGH
WBC: 17.5 — ABNORMAL HIGH

## 2011-01-17 LAB — BLOOD GAS, ARTERIAL
Patient temperature: 97.5
TCO2: 23.3
pCO2 arterial: 40.3
pH, Arterial: 7.353

## 2011-01-17 LAB — RETICULOCYTES
RBC.: 2.84 — ABNORMAL LOW
Retic Count, Absolute: 39.8

## 2011-01-17 LAB — TYPE AND SCREEN: Antibody Screen: NEGATIVE

## 2011-01-17 LAB — COMPREHENSIVE METABOLIC PANEL
Alkaline Phosphatase: 56
BUN: 70 — ABNORMAL HIGH
CO2: 21
Calcium: 6.9 — ABNORMAL LOW
GFR calc non Af Amer: 45 — ABNORMAL LOW
Glucose, Bld: 770
Total Protein: 5 — ABNORMAL LOW

## 2011-01-17 LAB — DIFFERENTIAL
Basophils Absolute: 0
Eosinophils Absolute: 0 — ABNORMAL LOW
Eosinophils Relative: 0
Neutrophils Relative %: 91 — ABNORMAL HIGH

## 2011-01-17 LAB — PROTIME-INR: Prothrombin Time: 17.1 — ABNORMAL HIGH

## 2011-01-17 LAB — I-STAT 8, (EC8 V) (CONVERTED LAB)
Acid-base deficit: 5 — ABNORMAL HIGH
Bicarbonate: 21.5
Potassium: 4.8
TCO2: 23
pCO2, Ven: 45.8
pH, Ven: 7.278

## 2011-01-17 LAB — IRON AND TIBC
Iron: 193 — ABNORMAL HIGH
UIBC: <55

## 2011-01-17 LAB — APTT: aPTT: 24

## 2011-01-17 LAB — URINE MICROSCOPIC-ADD ON

## 2011-01-17 LAB — KETONES, QUALITATIVE: Acetone, Bld: NEGATIVE

## 2011-01-17 LAB — OSMOLALITY: Osmolality: 330 — ABNORMAL HIGH

## 2011-01-17 LAB — URINE CULTURE
Colony Count: NO GROWTH
Culture: NO GROWTH

## 2011-01-17 LAB — LIPASE, BLOOD
Lipase: 18
Lipase: 18

## 2011-01-17 LAB — MAGNESIUM: Magnesium: 2.3

## 2011-01-17 LAB — ERYTHROPOIETIN: Erythropoietin: 71.1 — ABNORMAL HIGH

## 2011-01-17 LAB — PREPARE RBC (CROSSMATCH)

## 2011-01-17 LAB — VITAMIN B12: Vitamin B-12: 325 (ref 211–911)

## 2011-01-20 LAB — CBC
HCT: 21.7 — ABNORMAL LOW
HCT: 23.5 — ABNORMAL LOW
HCT: 24.1 — ABNORMAL LOW
HCT: 25.6 — ABNORMAL LOW
HCT: 27.3 — ABNORMAL LOW
HCT: 31.9 — ABNORMAL LOW
HCT: 27.9 — ABNORMAL LOW
Hemoglobin: 7.4 — CL
Hemoglobin: 7.9 — CL
Hemoglobin: 7.9 — CL
Hemoglobin: 8 — ABNORMAL LOW
Hemoglobin: 8.1 — ABNORMAL LOW
Hemoglobin: 8.6 — ABNORMAL LOW
Hemoglobin: 9.1 — ABNORMAL LOW
Hemoglobin: 9.1 — ABNORMAL LOW
MCHC: 33.3
MCHC: 33.4
MCHC: 33.4
MCHC: 34.1
MCV: 86.2
MCV: 86.4
MCV: 88.1
MCV: 88.5
MCV: 89
Platelets: 235
Platelets: 261
Platelets: 308
Platelets: 411 — ABNORMAL HIGH
Platelets: 429 — ABNORMAL HIGH
Platelets: 480 — ABNORMAL HIGH
RBC: 2.52 — ABNORMAL LOW
RBC: 2.73 — ABNORMAL LOW
RBC: 2.82 — ABNORMAL LOW
RBC: 3.14 — ABNORMAL LOW
RDW: 14.6 — ABNORMAL HIGH
RDW: 15.1 — ABNORMAL HIGH
RDW: 15.3 — ABNORMAL HIGH
RDW: 15.6 — ABNORMAL HIGH
RDW: 15.6 — ABNORMAL HIGH
WBC: 10
WBC: 9.7
WBC: 23.3 — ABNORMAL HIGH

## 2011-01-20 LAB — BASIC METABOLIC PANEL
BUN: 16
BUN: 19
BUN: 53 — ABNORMAL HIGH
BUN: 66 — ABNORMAL HIGH
BUN: 76 — ABNORMAL HIGH
CO2: 22
CO2: 22
CO2: 23
CO2: 26
CO2: 27
CO2: 32
CO2: 22
Calcium: 7.8 — ABNORMAL LOW
Calcium: 7.9 — ABNORMAL LOW
Calcium: 8 — ABNORMAL LOW
Calcium: 8.2 — ABNORMAL LOW
Chloride: 101
Chloride: 104
Chloride: 98
Chloride: 98
Chloride: 96
Creatinine, Ser: 1.21
Creatinine, Ser: 1.27
Creatinine, Ser: 1.29
Creatinine, Ser: 1.4
Creatinine, Ser: 1.98 — ABNORMAL HIGH
GFR calc Af Amer: >60
GFR calc Af Amer: >60
GFR calc Af Amer: >60
GFR calc Af Amer: >60
GFR calc Af Amer: >60
GFR calc Af Amer: 59 — ABNORMAL LOW
GFR calc non Af Amer: 55 — ABNORMAL LOW
GFR calc non Af Amer: 56 — ABNORMAL LOW
GFR calc non Af Amer: >60
GFR calc non Af Amer: >60
GFR calc non Af Amer: >60
GFR calc non Af Amer: >60
GFR calc non Af Amer: 37 — ABNORMAL LOW
Glucose, Bld: 173 — ABNORMAL HIGH
Glucose, Bld: 205 — ABNORMAL HIGH
Glucose, Bld: 257 — ABNORMAL HIGH
Glucose, Bld: 265 — ABNORMAL HIGH
Glucose, Bld: 384 — ABNORMAL HIGH
Glucose, Bld: 41 — ABNORMAL LOW
Glucose, Bld: 504
Glucose, Bld: 427 — ABNORMAL HIGH
Glucose, Bld: 704
Potassium: 3.3 — ABNORMAL LOW
Potassium: 3.6
Potassium: 3.7
Potassium: 3.8
Potassium: 4
Potassium: 4
Potassium: 4.2
Potassium: 4.8
Potassium: 4.7
Sodium: 128 — ABNORMAL LOW
Sodium: 132 — ABNORMAL LOW
Sodium: 134 — ABNORMAL LOW
Sodium: 135
Sodium: 136
Sodium: 140
Sodium: 125 — ABNORMAL LOW

## 2011-01-20 LAB — BLOOD GAS, ARTERIAL
Acid-base deficit: 1.8
O2 Saturation: 99
TCO2: 20.1
pCO2 arterial: 30.6 — ABNORMAL LOW
pO2, Arterial: 157 — ABNORMAL HIGH

## 2011-01-20 LAB — CROSSMATCH: ABO/RH(D): O POS

## 2011-01-20 LAB — LIPID PANEL: HDL: 38 — ABNORMAL LOW

## 2011-01-20 LAB — URINALYSIS, ROUTINE W REFLEX MICROSCOPIC
Bilirubin Urine: NEGATIVE
Glucose, UA: >1000 — AB
Leukocytes, UA: NEGATIVE
Nitrite: NEGATIVE
Protein, ur: NEGATIVE

## 2011-01-20 LAB — DIFFERENTIAL
Basophils Absolute: 0
Basophils Relative: 0
Lymphocytes Relative: 5 — ABNORMAL LOW
Neutro Abs: 21.4 — ABNORMAL HIGH
Neutrophils Relative %: 92 — ABNORMAL HIGH

## 2011-01-20 LAB — COMPREHENSIVE METABOLIC PANEL
Albumin: 2.8 — ABNORMAL LOW
Alkaline Phosphatase: 88
BUN: 66 — ABNORMAL HIGH
Chloride: 86 — ABNORMAL LOW
Creatinine, Ser: 2.02 — ABNORMAL HIGH
GFR calc non Af Amer: 37 — ABNORMAL LOW
Glucose, Bld: 911
Potassium: 7.5
Total Bilirubin: 1.5 — ABNORMAL HIGH

## 2011-01-20 LAB — HAPTOGLOBIN: Haptoglobin: 235 — ABNORMAL HIGH

## 2011-01-20 LAB — POCT CARDIAC MARKERS
CKMB, poc: 1.4
CKMB, poc: 1.8
Operator id: 1415
Troponin i, poc: <0.05
Troponin i, poc: <0.05

## 2011-01-20 LAB — RAPID URINE DRUG SCREEN, HOSP PERFORMED
Barbiturates: NOT DETECTED
Cocaine: POSITIVE — AB
Opiates: NOT DETECTED
Tetrahydrocannabinol: POSITIVE — AB

## 2011-01-20 LAB — HEMOGLOBIN A1C: Mean Plasma Glucose: 286

## 2011-01-20 LAB — IRON AND TIBC: UIBC: 214

## 2011-01-20 LAB — LACTATE DEHYDROGENASE: LDH: 96

## 2011-01-20 LAB — ETHANOL: Alcohol, Ethyl (B): <5

## 2011-01-20 LAB — CK: Total CK: 128

## 2011-01-20 LAB — CULTURE, BLOOD (ROUTINE X 2): Culture: NO GROWTH

## 2011-01-20 LAB — VITAMIN B12: Vitamin B-12: 484 (ref 211–911)

## 2011-01-20 LAB — PREPARE RBC (CROSSMATCH)

## 2011-01-20 LAB — RETICULOCYTES: RBC.: 3.08 — ABNORMAL LOW

## 2011-01-23 LAB — URINALYSIS, ROUTINE W REFLEX MICROSCOPIC
Ketones, ur: 40 — AB
Leukocytes, UA: NEGATIVE
Protein, ur: NEGATIVE
Urobilinogen, UA: 0.2

## 2011-01-23 LAB — I-STAT 8, (EC8 V) (CONVERTED LAB)
BUN: 20
Chloride: 99
Glucose, Bld: 530
Potassium: 4.7
pCO2, Ven: 40.9 — ABNORMAL LOW
pH, Ven: 7.434 — ABNORMAL HIGH

## 2011-01-23 LAB — CBC
HCT: 44.3
Hemoglobin: 14.6
RBC: 5.12
RDW: 14.9 — ABNORMAL HIGH

## 2011-01-23 LAB — DIFFERENTIAL
Basophils Absolute: 0.1
Eosinophils Relative: 0
Lymphocytes Relative: 10 — ABNORMAL LOW
Monocytes Absolute: 0.2
Monocytes Relative: 2 — ABNORMAL LOW

## 2011-01-23 LAB — RAPID URINE DRUG SCREEN, HOSP PERFORMED
Amphetamines: NOT DETECTED
Benzodiazepines: NOT DETECTED
Tetrahydrocannabinol: POSITIVE — AB

## 2011-01-23 LAB — URINE MICROSCOPIC-ADD ON

## 2011-01-23 LAB — POCT I-STAT CREATININE
Creatinine, Ser: 1.5
Operator id: 235261

## 2011-02-22 NOTE — Progress Notes (Signed)
Addended by: Neomia Dear on: 02/22/2011 04:04 PM   Modules accepted: Orders

## 2011-03-28 ENCOUNTER — Encounter: Payer: Self-pay | Admitting: Internal Medicine

## 2011-03-28 ENCOUNTER — Ambulatory Visit (INDEPENDENT_AMBULATORY_CARE_PROVIDER_SITE_OTHER): Payer: Self-pay | Admitting: Internal Medicine

## 2011-03-28 ENCOUNTER — Other Ambulatory Visit: Payer: Self-pay | Admitting: Internal Medicine

## 2011-03-28 VITALS — BP 142/94 | HR 84 | Temp 98.1°F | Ht 68.0 in | Wt 208.4 lb

## 2011-03-28 DIAGNOSIS — F17200 Nicotine dependence, unspecified, uncomplicated: Secondary | ICD-10-CM

## 2011-03-28 DIAGNOSIS — E119 Type 2 diabetes mellitus without complications: Secondary | ICD-10-CM

## 2011-03-28 DIAGNOSIS — E1169 Type 2 diabetes mellitus with other specified complication: Secondary | ICD-10-CM

## 2011-03-28 DIAGNOSIS — E11621 Type 2 diabetes mellitus with foot ulcer: Secondary | ICD-10-CM

## 2011-03-28 DIAGNOSIS — K219 Gastro-esophageal reflux disease without esophagitis: Secondary | ICD-10-CM

## 2011-03-28 DIAGNOSIS — Z23 Encounter for immunization: Secondary | ICD-10-CM

## 2011-03-28 DIAGNOSIS — E1149 Type 2 diabetes mellitus with other diabetic neurological complication: Secondary | ICD-10-CM

## 2011-03-28 DIAGNOSIS — L97509 Non-pressure chronic ulcer of other part of unspecified foot with unspecified severity: Secondary | ICD-10-CM

## 2011-03-28 DIAGNOSIS — I10 Essential (primary) hypertension: Secondary | ICD-10-CM

## 2011-03-28 DIAGNOSIS — Z590 Homelessness: Secondary | ICD-10-CM

## 2011-03-28 LAB — GLUCOSE, CAPILLARY: Glucose-Capillary: 147 mg/dL — ABNORMAL HIGH (ref 70–99)

## 2011-03-28 MED ORDER — INSULIN ASPART PROT & ASPART (70-30 MIX) 100 UNIT/ML ~~LOC~~ SUSP: SUBCUTANEOUS | Status: DC

## 2011-03-28 MED ORDER — GABAPENTIN 300 MG PO CAPS: 600.0000 mg | ORAL_CAPSULE | Freq: Two times a day (BID) | ORAL | Status: DC

## 2011-03-28 MED ORDER — HYDROCHLOROTHIAZIDE 25 MG PO TABS: 25.0000 mg | ORAL_TABLET | Freq: Every day | ORAL | Status: DC

## 2011-03-28 MED ORDER — LISINOPRIL 40 MG PO TABS: 40.0000 mg | ORAL_TABLET | Freq: Every day | ORAL | Status: DC

## 2011-03-28 MED ORDER — PANTOPRAZOLE SODIUM 40 MG PO TBEC: 40.0000 mg | DELAYED_RELEASE_TABLET | Freq: Two times a day (BID) | ORAL | Status: DC

## 2011-03-28 MED ORDER — HYDROCODONE-ACETAMINOPHEN 5-500 MG PO TABS: 1.0000 | ORAL_TABLET | Freq: Four times a day (QID) | ORAL | Status: DC | PRN

## 2011-03-28 NOTE — Assessment & Plan Note (Signed)
Patient continues to have neuropathic pain in the bilateral feet and legs, likely secondary to diabetic neuropathy. His biggest challenge is obtaining gabapentin. It works for him when he is able to get it. We are trying to get him plugged into the MIP so he can get assistance of medicines.

## 2011-03-28 NOTE — Assessment & Plan Note (Signed)
Surgical incision is now well healed, but patient now with right calcaneal ulcer. It is not deep, but the patient does not wear proper shoes and does walk a lot because of his lack of transportation. We've referred him to the [facility] long wound care clinic, and stressed the importance of getting proper wound care.
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
## 2011-03-28 NOTE — Assessment & Plan Note (Signed)
Patient's blood pressure in acceptable range. He is running out of lisinopril, and as the problem is with his medicines, he needs to obtain them.

## 2011-03-28 NOTE — Assessment & Plan Note (Signed)
Patient A1c today is 12, worse than prior. Patient has continued difficulty obtaining medicines. Wrote refills for insulin and gave him 3 pens of insulin 70/30 from the clinic supply room. Patient is applying for the MAP and will use prescriptions when he can.

## 2011-03-28 NOTE — Progress Notes (Signed)
Subjective:     Patient ID: [name], male   DOB: 1965/10/02, 45 y.o.   MRN: [id number]  HPI [name] is a 45 year old gentleman with diabetes, hypertension and a very poor social situation. I last saw [name] in July of this year. Since then he has moved to [place] to obtain Medicaid, which he did, but was unable to find housing. He has since moved back to [place] and is unable to get Medicaid in the state. He is a former cocaine abuser and has 0 income. He currently lives with his girlfriend in an abandoned house that they have lived in for many years. They have no electricity or water. He still has the orange card, but is reapplying for the MAP program. He has issues with transportation, and making any medical appointment is very difficult for him. He often walks long distances to get his appointments, or relies on help from neighbors for rides.  He says he was hospitalized for [place] for "high sugars". He takes insulin when he has it. He also experiences neuropathy for which he takes gabapentin when he has it. He is running out of all his medicines and has just a little bit of insulin left. He has run out of gabapentin and lisinopril.  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 These infections have since healed, but now the patient has a new also on his right heel that he is reporting.  Review of Systems     Objective:   Physical Exam Physical Exam GEN: NAD.  Alert and oriented x 3.  Pleasant, conversant, and cooperative to exam. RESP:  CTAB, no w/r/r CARDIOVASCULAR: RRR, S1, S2, no m/r/g ABDOMEN: soft, NT/ND, NABS EXT: warm and dry. No edema in b/l LE R foot: amputated great toe with healed surgical site.  Linear ucler on calcaneus roughly 3cm in length, slit-like with no erythema surrounding. Is through to dermis but not deeper.  Almost like a cut that has not healed, but surrounding skin is  concentrically broken down, giving a more ulcerative appearance. L foot: amputated great toe with healed surgical site.  Signs of healed ulcer near lateral tarsal-metatarsal joint on outside of foot.    Assessment:        Plan:

## 2011-03-28 NOTE — Assessment & Plan Note (Signed)
Patient continues to smoke half-pack a day, does not have interest in stopping at this point

## 2011-03-29 LAB — LIPID PANEL
Cholesterol: 177 mg/dL (ref 0–200)
Total CHOL/HDL Ratio: 2.9 Ratio
VLDL: 16 mg/dL (ref 0–40)

## 2011-03-30 ENCOUNTER — Other Ambulatory Visit: Payer: Self-pay | Admitting: Licensed Clinical Social Worker

## 2011-03-30 ENCOUNTER — Telehealth: Payer: Self-pay | Admitting: Licensed Clinical Social Worker

## 2011-03-30 NOTE — Telephone Encounter (Signed)
Called patient in response to referral from nurse and PCP.  [place]/ girlfriend in home owned by his parents.  He receives $100 in foodstamps.  He has no running water or utilities and uses fire wood for heating.  He has no access to transit out in the county. Disability/Medicaid:  He and his girlfriend are both applying for Disability and his attorney is out of [place].  Patient cannot get to his wound care appointments because of transportation.  Relies on transportation from  Patient's minutes are running out and so he will call me back from neighbor's phone.     Plan is to call [facility] for transit options to see if they can go out to [employer].  Will also refer to [facility] to assist due to multiple complex issues and no income.

## 2011-04-13 ENCOUNTER — Telehealth: Payer: Self-pay | Admitting: Licensed Clinical Social Worker

## 2011-04-13 NOTE — Telephone Encounter (Signed)
Deb spoke to Mr. Joffe.   Still needing transit for Jan. 9th appmt at wound center.   Faxed referral to Vania Rea at [facility] on 03/30/11 so he could work w/ Mr. Port re: multiple issues.   I left second message w/ Sherria High at [facility] re: whether they can go out to [employer] for transit.  Will follow w/ Francesco Sor at [facility].

## 2011-04-19 ENCOUNTER — Encounter (HOSPITAL_BASED_OUTPATIENT_CLINIC_OR_DEPARTMENT_OTHER): Payer: Self-pay

## 2011-05-09 ENCOUNTER — Other Ambulatory Visit: Payer: Self-pay | Admitting: Internal Medicine

## 2011-05-09 DIAGNOSIS — E119 Type 2 diabetes mellitus without complications: Secondary | ICD-10-CM

## 2011-05-11 NOTE — Progress Notes (Signed)
Addended by: Neomia Dear on: 05/11/2011 06:14 PM   Modules accepted: Orders

## 2011-05-24 ENCOUNTER — Encounter (HOSPITAL_BASED_OUTPATIENT_CLINIC_OR_DEPARTMENT_OTHER): Payer: Self-pay

## 2011-06-06 ENCOUNTER — Ambulatory Visit (INDEPENDENT_AMBULATORY_CARE_PROVIDER_SITE_OTHER): Payer: Self-pay | Admitting: Internal Medicine

## 2011-06-06 ENCOUNTER — Encounter: Payer: Self-pay | Admitting: Internal Medicine

## 2011-06-06 VITALS — BP 163/97 | HR 105 | Temp 97.4°F | Ht 68.0 in | Wt 205.7 lb

## 2011-06-06 DIAGNOSIS — E1169 Type 2 diabetes mellitus with other specified complication: Secondary | ICD-10-CM

## 2011-06-06 DIAGNOSIS — L97509 Non-pressure chronic ulcer of other part of unspecified foot with unspecified severity: Secondary | ICD-10-CM

## 2011-06-06 DIAGNOSIS — E11621 Type 2 diabetes mellitus with foot ulcer: Secondary | ICD-10-CM

## 2011-06-06 LAB — GLUCOSE, CAPILLARY: Glucose-Capillary: 244 mg/dL — ABNORMAL HIGH (ref 70–99)

## 2011-06-06 NOTE — Assessment & Plan Note (Signed)
Pt was unable to get to wound care appt in January.  Now, not only is his R calcaneal ulcer worsening, but also he has developed a very concerning L foot plantar ulcer.  The visit today is nearly a clone of last visit in December.  The pt has problems with transportation, medicine, and care, and has ulcers and is running out of meds.  I stressed the importance of getting to wound care, to which we have yet again referred the pt.  I explained that rather than coming to see me, which he has done 3 times since July, that he should devote time to going to wound care.  Futhermore, our SW and RN's have taken many steps to try to help get this pt resources.  While very pleasant and cooperative, there are always excuses, barriers, and things to do other than help himself.  He has promised to go to the MAP multiple times but seems never to get there before it closes.  He has missed multiple wound care appts.  His girlfriend has a disability hearing in July, and he is applying for his now but is in process.  He needs whatever services we can provide for him, so I have referred him urgently to our incoming SW, who will be starting in a couple of weeks in mid March.  I re-referred him to wound clinic for his preference of Tuesday mornings as well.  We gave him emergency insulin last visit, and in addition to getting some from a friend, still has some left.  He will need to come back to clinic or go to the ER if he runs out.  His place of living seems to be a major barrier since many agencies will not go to where he lives, but the pt has been resistant to alternative housing.  He needs to be able to get medicine and attend basic medical appointments if he wants to keep his feet and basic health, and I look forward to help from our new SW.  Debbie the RN has already given the pt multiple options in terms of housing and med assistance, none of which have worked thus far.  We will con't to try, as we have to try to help  this pt get his DM under control and try to limit progression of his ulcers.  His other medical issues are secondary at this time.

## 2011-06-06 NOTE — Progress Notes (Signed)
Subjective:     Patient ID: [name], male   DOB: 1965/09/02, 46 y.o.   MRN: [id number]  HPI 46 yo M wih poorly controlled DM, multiple digit amputations 2/2 DM, h/o PSA, and many social issues who presents for f/u.  Since out last visit in December the pt con't to struggle with his typical housing, transportation, and social needs.  He was unable to attend the wound care clinic appt in January.  He has been unable to reapply for the MAP program.  I do not think he still has the orange card either.  He is having trouble with the sheriff and accusations regarding rights to his house, but he has a will saying he has rights to live there, so he is anxious about that.  Prior to her leaving, Lupita Leash T had tried to arrange for help with transport and referal to [facility] or [facility], but pt lives in [place] which makes things difficult.  Review of Systems Running out of insulin.  Denies F/C, fatigue, sweats, pain.    Objective:   Physical Exam GEN: NAD, pleasant R foot: amputated great toe with healed surgical site.  Linear ulcer on calcaneous roughly 3 cm in length has expanded since last visit.  Erythema and crusted blood line wound.  Part of large callus, likely into dermis but no deeper, which is similar to prior. L foot: new plantar ulcer under 4th MCP that is very deep but does not appear to go to bone.  Roughly penny sized.  3rd toe tip has peeled skin wound that is actively bleeding scant blood.    Assessment:         Plan:

## 2011-06-09 HISTORY — PX: TOE AMPUTATION: SHX809

## 2011-06-20 ENCOUNTER — Encounter (HOSPITAL_BASED_OUTPATIENT_CLINIC_OR_DEPARTMENT_OTHER): Payer: Medicaid Other | Attending: General Surgery

## 2011-07-05 ENCOUNTER — Telehealth: Payer: Self-pay | Admitting: Dietician

## 2011-07-10 NOTE — Telephone Encounter (Signed)
Left message for patient to call if he is interested in free annual eye exam this month.

## 2011-07-18 ENCOUNTER — Encounter (HOSPITAL_BASED_OUTPATIENT_CLINIC_OR_DEPARTMENT_OTHER): Payer: Self-pay

## 2011-08-01 ENCOUNTER — Encounter: Payer: Self-pay | Admitting: Licensed Clinical Social Worker

## 2011-08-01 ENCOUNTER — Other Ambulatory Visit: Payer: Self-pay | Admitting: *Deleted

## 2011-08-01 DIAGNOSIS — E119 Type 2 diabetes mellitus without complications: Secondary | ICD-10-CM

## 2011-08-01 NOTE — Progress Notes (Signed)
[name] is a 46 y.o. male with multiple social and economic issues.  [name] requesting to see CSW today for assistance.  [name] currently lives in  [place], [place] with no utilities or transportation.  Pt's [employer] orange card has expired and he plans to re-apply today.  Pt states he is a 2 hour walk to the nearest bus stop.  Pt states he is in need of a notarized letter from his neighbor stating he does not have utilities; however, neighbors are elderly and do not have their own transportation to get to a notary.  Pt states he is out of insulin and is in need of his orange card today to obtain his medications.  Pt's neighbors happen to be in the [facility] today.  Neighbor in agreement with assisting [name] with needed documentation.  CSW assisted neighbor with getting a notarized signed letter for pt to provide to [employer].  Pt is signed up to see Financial counselor today.  CSW encouraged pt not to let his orange card expire. CSW provided pt with info on [facility], pt states he has past experiences with [facility] and may not have left a good impression for him to receive services.  Once pt is eligible for orange card, CSW will refer pt to [facility] uninsured program.  Pt's phone is limited, but can receive mail.  Pt's significant other provided CSW with working number for her as a referral number for [facility].  CSW provided pt with bus voucher and $30 in [employer] gift card.  Instructed pt to utilize bus voucher to obtain needed insulin and $30 gift card to [facility] for insulin Rx, as unsure if pt will get to MAP prior to closing today.  CSW will also initiate [facility] application, as pt does live in [facility] [place].  Pt has CSW contact name and information.  Pt denies add'l needs at this time, aware CSW is available to assist as needed.

## 2011-08-02 ENCOUNTER — Encounter: Payer: Self-pay | Admitting: Licensed Clinical Social Worker

## 2011-08-02 NOTE — Progress Notes (Signed)
Pt is now current with [employer].  CSW faxed referral to [facility] uninsured program.

## 2011-08-03 MED ORDER — INSULIN ASPART PROT & ASPART (70-30 MIX) 100 UNIT/ML ~~LOC~~ SUSP: SUBCUTANEOUS | Status: DC

## 2011-08-03 NOTE — Telephone Encounter (Signed)
Rx called in, amount changed to 20 ml a month

## 2011-08-04 ENCOUNTER — Other Ambulatory Visit: Payer: Self-pay | Admitting: *Deleted

## 2011-08-04 DIAGNOSIS — E119 Type 2 diabetes mellitus without complications: Secondary | ICD-10-CM

## 2011-08-04 MED ORDER — GABAPENTIN 300 MG PO CAPS: 600.0000 mg | ORAL_CAPSULE | Freq: Two times a day (BID) | ORAL | Status: DC

## 2011-08-04 MED ORDER — QUINAPRIL-HYDROCHLOROTHIAZIDE 20-12.5 MG PO TABS: 2.0000 | ORAL_TABLET | Freq: Every day | ORAL | Status: DC

## 2011-08-04 NOTE — Telephone Encounter (Signed)
Needs refill on Lisinopril and HCTZ - [facility] wants to know if you would consider changing to Accuretic 20/12.5 mg 2 tas daily so meds will be free to pt.

## 2011-08-04 NOTE — Telephone Encounter (Signed)
This action is to approve a request from the pharmacy.

## 2011-08-07 ENCOUNTER — Telehealth: Payer: Self-pay | Admitting: Licensed Clinical Social Worker

## 2011-08-07 NOTE — Telephone Encounter (Signed)
Pt and significant other call to inquire if CSW had bus passes available for scheduled appointments.  Pt states he has multiple appt's schedule and is in need of transportation.  CSW able to provide pt with 5 one way passes for scheduled appointments.  CSW left at front desk for pt to pick up.

## 2011-08-09 ENCOUNTER — Telehealth: Payer: Self-pay | Admitting: Licensed Clinical Social Worker

## 2011-08-09 NOTE — Telephone Encounter (Signed)
CSW received call today from [place] at [facility].  [facility] is trying to reach pt as well as Francesco Sor at [facility] for case management.  Pt was in 08/08/11 to [facility] to pick up bus passes and also went to MAP to obtain medications.  Pt's current contact number does not have minutes.  Pt will need to schedule appt with PCP in [facility], contact [facility] and [facility] prior to obtaining add'l resources from this CSW.  CSW is available to assist pt while at a scheduled medical appt.

## 2011-08-11 ENCOUNTER — Other Ambulatory Visit: Payer: Self-pay | Admitting: *Deleted

## 2011-08-11 ENCOUNTER — Telehealth: Payer: Self-pay | Admitting: Licensed Clinical Social Worker

## 2011-08-11 DIAGNOSIS — K219 Gastro-esophageal reflux disease without esophagitis: Secondary | ICD-10-CM

## 2011-08-11 MED ORDER — PANTOPRAZOLE SODIUM 40 MG PO TBEC: 40.0000 mg | DELAYED_RELEASE_TABLET | Freq: Two times a day (BID) | ORAL | Status: DC

## 2011-08-11 NOTE — Telephone Encounter (Signed)
CSW received call from Crystal at MAP.  Pt arrived to obtain medications.  Crystal and Pt returned call to [place] at [facility] and left message.  Crystal states pt's phone numbers are now back on.  Crystal informed CSW pt continues to c/o transportation issues and that is why is has not made any follow up appt's.  Crystal inquiring if pt could receive add'l bus passes for f/u [facility] appt and wound care appt.  Crystal instructed pt to continue to follow up with Francesco Sor at [facility] as he can help with transportation.  CSW will provide pt with 4-one way fare passes for [facility] appt and wound care.  Pt has used 2 of previous passes for medications pick ups.  CSW will leave at front desk for pt if after CSW hours.

## 2011-08-11 NOTE — Telephone Encounter (Signed)
Please make continuity appt with Dr Berlinda Last June.

## 2011-08-11 NOTE — Telephone Encounter (Signed)
Protonix refilled - rx request form faxed to [facility] MAP pharmacy. Message sent to front desk to sch pt an appt.

## 2011-08-15 ENCOUNTER — Telehealth: Payer: Self-pay | Admitting: Licensed Clinical Social Worker

## 2011-08-15 NOTE — Telephone Encounter (Signed)
Received call from [facility] uninsured case worker, Mr. Seward Meth.  Informed Mr. Seward Meth, pt is currently inpatient and may need facility upon d/c.  [employer] will inform [facility] transitional care.

## 2011-08-17 ENCOUNTER — Telehealth: Payer: Self-pay | Admitting: *Deleted

## 2011-08-17 NOTE — Telephone Encounter (Signed)
Message left on ID recording to check directios on generic Protonix 40mg  #120 - one tablet by mouth two times daily. Orders called into pharmacy 08/11/11 - written Dr Rogelia Boga to  [facility] pharmacy. Stanton Kidney Jalena Vanderlinden RN 08/17/11 11:30AM

## 2011-08-18 ENCOUNTER — Ambulatory Visit (HOSPITAL_COMMUNITY)
Admission: RE | Admit: 2011-08-18 | Discharge: 2011-08-18 | Disposition: A | Discharge status: Home / Self Care | Payer: Self-pay | Source: Ambulatory Visit | Attending: General Surgery | Admitting: General Surgery

## 2011-08-18 ENCOUNTER — Other Ambulatory Visit (HOSPITAL_BASED_OUTPATIENT_CLINIC_OR_DEPARTMENT_OTHER): Payer: Self-pay | Admitting: General Surgery

## 2011-08-18 ENCOUNTER — Encounter (HOSPITAL_BASED_OUTPATIENT_CLINIC_OR_DEPARTMENT_OTHER): Payer: Self-pay | Attending: General Surgery

## 2011-08-18 DIAGNOSIS — R52 Pain, unspecified: Secondary | ICD-10-CM

## 2011-08-18 DIAGNOSIS — E119 Type 2 diabetes mellitus without complications: Secondary | ICD-10-CM | POA: Insufficient documentation

## 2011-08-18 DIAGNOSIS — Z794 Long term (current) use of insulin: Secondary | ICD-10-CM | POA: Insufficient documentation

## 2011-08-18 DIAGNOSIS — M79609 Pain in unspecified limb: Secondary | ICD-10-CM | POA: Insufficient documentation

## 2011-08-18 DIAGNOSIS — L97509 Non-pressure chronic ulcer of other part of unspecified foot with unspecified severity: Secondary | ICD-10-CM | POA: Insufficient documentation

## 2011-08-18 DIAGNOSIS — F17200 Nicotine dependence, unspecified, uncomplicated: Secondary | ICD-10-CM | POA: Insufficient documentation

## 2011-08-18 DIAGNOSIS — S98119A Complete traumatic amputation of unspecified great toe, initial encounter: Secondary | ICD-10-CM | POA: Insufficient documentation

## 2011-08-18 LAB — GLUCOSE, CAPILLARY: Glucose-Capillary: 211 mg/dL — ABNORMAL HIGH (ref 70–99)

## 2011-08-19 NOTE — Progress Notes (Signed)
[facility]  NAME:  KHIREE, BUKHARI NO.:  [id number]  MEDICAL RECORD NO.:  [id number]      DATE OF BIRTH:  1965-08-09  PHYSICIAN:  Ardath Sax, M.D.           VISIT DATE:                                  OFFICE VISIT   Mr. Morandi is a 46 year old African American male who has been a diabetic for about 20 years.  He is on Lantus insulin with Humalog p.r.n. for his blood sugars.  He has been on insulin ever since he was diagnosed with juvenile diabetes.  He has been poorly controlled and his doctor is Gurney Maxin, and they have had trouble because he has had a lot of social needs.  He was unable to attend our Wound Clinic in January.  He has trouble with transportation and his living arrangements.  On examination today, he has a temperature of 100.  Other than that, his vital signs are normal.  He has had a right great toe amputated and has a lot of callus around it, but no ulcer.  On the left foot, there is a 1 cm deep ulcer over the fourth metatarsal head, which I suspect is osteo. We got a culture today and got an x-ray today to see if there is an osteo there and when he comes back, he will be treated appropriately with antibiotics.  The gentleman other than his poorly care for diabetes is fairly healthy.  He does not take any medicine other than his insulin and he does smoke, but he has a normal blood pressure.  His last A1c was 12, so he is in poor control and he tells me his last blood sugar that he took was 195.  So, today I debrided callus off both of his feet especially around the right great toe amputation site.  There is a lot of callus and on the plantar aspect of the left foot, and then we packed it with collagen and dress the foot and sent him for the x-ray.     Ardath Sax, M.D.     PP/MEDQ  D:  08/18/2011  T:  08/19/2011  Job:  [id number]

## 2011-08-21 ENCOUNTER — Encounter (HOSPITAL_COMMUNITY): Payer: Self-pay | Admitting: Emergency Medicine

## 2011-08-21 ENCOUNTER — Emergency Department (HOSPITAL_COMMUNITY): Payer: Medicaid Other

## 2011-08-21 ENCOUNTER — Inpatient Hospital Stay (HOSPITAL_COMMUNITY)
Admission: EM | Admit: 2011-08-21 | Discharge: 2011-08-26 | DRG: 617 | Disposition: A | Discharge status: Home / Self Care | Payer: Medicaid Other | Attending: Internal Medicine | Admitting: Internal Medicine

## 2011-08-21 DIAGNOSIS — E1149 Type 2 diabetes mellitus with other diabetic neurological complication: Secondary | ICD-10-CM | POA: Diagnosis present

## 2011-08-21 DIAGNOSIS — E1140 Type 2 diabetes mellitus with diabetic neuropathy, unspecified: Secondary | ICD-10-CM | POA: Diagnosis present

## 2011-08-21 DIAGNOSIS — I10 Essential (primary) hypertension: Secondary | ICD-10-CM | POA: Diagnosis present

## 2011-08-21 DIAGNOSIS — Z794 Long term (current) use of insulin: Secondary | ICD-10-CM

## 2011-08-21 DIAGNOSIS — L089 Local infection of the skin and subcutaneous tissue, unspecified: Secondary | ICD-10-CM | POA: Diagnosis present

## 2011-08-21 DIAGNOSIS — S98139A Complete traumatic amputation of one unspecified lesser toe, initial encounter: Secondary | ICD-10-CM

## 2011-08-21 DIAGNOSIS — M9080 Osteopathy in diseases classified elsewhere, unspecified site: Secondary | ICD-10-CM | POA: Diagnosis present

## 2011-08-21 DIAGNOSIS — I129 Hypertensive chronic kidney disease with stage 1 through stage 4 chronic kidney disease, or unspecified chronic kidney disease: Secondary | ICD-10-CM | POA: Diagnosis present

## 2011-08-21 DIAGNOSIS — D631 Anemia in chronic kidney disease: Secondary | ICD-10-CM | POA: Diagnosis present

## 2011-08-21 DIAGNOSIS — E11621 Type 2 diabetes mellitus with foot ulcer: Secondary | ICD-10-CM | POA: Diagnosis present

## 2011-08-21 DIAGNOSIS — L97509 Non-pressure chronic ulcer of other part of unspecified foot with unspecified severity: Secondary | ICD-10-CM | POA: Diagnosis present

## 2011-08-21 DIAGNOSIS — Z905 Acquired absence of kidney: Secondary | ICD-10-CM

## 2011-08-21 DIAGNOSIS — F1110 Opioid abuse, uncomplicated: Secondary | ICD-10-CM | POA: Diagnosis present

## 2011-08-21 DIAGNOSIS — F17200 Nicotine dependence, unspecified, uncomplicated: Secondary | ICD-10-CM | POA: Diagnosis present

## 2011-08-21 DIAGNOSIS — E1142 Type 2 diabetes mellitus with diabetic polyneuropathy: Secondary | ICD-10-CM | POA: Diagnosis present

## 2011-08-21 DIAGNOSIS — M86172 Other acute osteomyelitis, left ankle and foot: Secondary | ICD-10-CM

## 2011-08-21 DIAGNOSIS — Z91199 Patient's noncompliance with other medical treatment and regimen due to unspecified reason: Secondary | ICD-10-CM

## 2011-08-21 DIAGNOSIS — E1165 Type 2 diabetes mellitus with hyperglycemia: Secondary | ICD-10-CM | POA: Diagnosis present

## 2011-08-21 DIAGNOSIS — F1410 Cocaine abuse, uncomplicated: Secondary | ICD-10-CM | POA: Diagnosis present

## 2011-08-21 DIAGNOSIS — F1010 Alcohol abuse, uncomplicated: Secondary | ICD-10-CM | POA: Diagnosis present

## 2011-08-21 DIAGNOSIS — IMO0002 Reserved for concepts with insufficient information to code with codable children: Principal | ICD-10-CM | POA: Diagnosis present

## 2011-08-21 DIAGNOSIS — N182 Chronic kidney disease, stage 2 (mild): Secondary | ICD-10-CM | POA: Diagnosis present

## 2011-08-21 DIAGNOSIS — E1169 Type 2 diabetes mellitus with other specified complication: Principal | ICD-10-CM | POA: Diagnosis present

## 2011-08-21 DIAGNOSIS — E1351 Other specified diabetes mellitus with diabetic peripheral angiopathy without gangrene: Secondary | ICD-10-CM

## 2011-08-21 DIAGNOSIS — N189 Chronic kidney disease, unspecified: Secondary | ICD-10-CM

## 2011-08-21 DIAGNOSIS — E119 Type 2 diabetes mellitus without complications: Secondary | ICD-10-CM

## 2011-08-21 DIAGNOSIS — M869 Osteomyelitis, unspecified: Secondary | ICD-10-CM | POA: Diagnosis present

## 2011-08-21 DIAGNOSIS — Z9119 Patient's noncompliance with other medical treatment and regimen: Secondary | ICD-10-CM

## 2011-08-21 DIAGNOSIS — Z598 Other problems related to housing and economic circumstances: Secondary | ICD-10-CM | POA: Diagnosis present

## 2011-08-21 LAB — CBC
Hemoglobin: 10.7 g/dL — ABNORMAL LOW (ref 13.0–17.0)
MCH: 28.8 pg (ref 26.0–34.0)
MCHC: 32.2 g/dL (ref 30.0–36.0)
MCV: 89.5 fL (ref 78.0–100.0)

## 2011-08-21 LAB — POCT I-STAT, CHEM 8
Calcium, Ion: 1.15 mmol/L (ref 1.12–1.32)
Creatinine, Ser: 1.4 mg/dL — ABNORMAL HIGH (ref 0.50–1.35)
Glucose, Bld: 200 mg/dL — ABNORMAL HIGH (ref 70–99)
HCT: 35 % — ABNORMAL LOW (ref 39.0–52.0)
Hemoglobin: 11.9 g/dL — ABNORMAL LOW (ref 13.0–17.0)
TCO2: 24 mmol/L (ref 0–100)

## 2011-08-21 LAB — DIFFERENTIAL
Basophils Relative: 0 % (ref 0–1)
Eosinophils Absolute: 0.3 10*3/uL (ref 0.0–0.7)
Lymphs Abs: 1.7 10*3/uL (ref 0.7–4.0)
Monocytes Absolute: 1.7 10*3/uL — ABNORMAL HIGH (ref 0.1–1.0)
Neutro Abs: 13.2 10*3/uL — ABNORMAL HIGH (ref 1.7–7.7)
Neutrophils Relative %: 78 % — ABNORMAL HIGH (ref 43–77)

## 2011-08-21 LAB — GLUCOSE, CAPILLARY: Glucose-Capillary: 182 mg/dL — ABNORMAL HIGH (ref 70–99)

## 2011-08-21 MED ORDER — HYDROMORPHONE HCL PF 1 MG/ML IJ SOLN
1.0000 mg | Freq: Once | INTRAMUSCULAR | Status: AC
  Administered 2011-08-21: 1 mg via INTRAVENOUS
  Filled 2011-08-21: qty 1

## 2011-08-21 MED ORDER — HYDROMORPHONE HCL PF 1 MG/ML IJ SOLN: 1.0000 mg | INTRAMUSCULAR | Status: DC | PRN

## 2011-08-21 MED ORDER — SODIUM CHLORIDE 0.9 % IV BOLUS (SEPSIS)
1000.0000 mL | Freq: Once | INTRAVENOUS | Status: AC
  Administered 2011-08-21: 1000 mL via INTRAVENOUS

## 2011-08-21 MED ORDER — ONDANSETRON HCL 4 MG/2ML IJ SOLN
4.0000 mg | Freq: Once | INTRAMUSCULAR | Status: AC
  Administered 2011-08-21: 4 mg via INTRAVENOUS
  Filled 2011-08-21: qty 2

## 2011-08-21 MED ORDER — ACETAMINOPHEN 325 MG PO TABS
650.0000 mg | ORAL_TABLET | Freq: Once | ORAL | Status: AC
  Administered 2011-08-21: 650 mg via ORAL
  Filled 2011-08-21: qty 2

## 2011-08-21 MED ORDER — CIPROFLOXACIN IN D5W 400 MG/200ML IV SOLN
400.0000 mg | Freq: Once | INTRAVENOUS | Status: AC
  Administered 2011-08-21: 400 mg via INTRAVENOUS
  Filled 2011-08-21: qty 200

## 2011-08-21 MED ORDER — CLINDAMYCIN PHOSPHATE 900 MG/50ML IV SOLN
900.0000 mg | INTRAVENOUS | Status: DC
  Filled 2011-08-21 (×2): qty 50

## 2011-08-21 NOTE — ED Notes (Signed)
Called and gave report to Erica. 

## 2011-08-21 NOTE — H&P (Signed)
Hospital Admission Note Date: 08/21/2011  Patient name: [name] Medical record number: [id number] Date of birth: [birth year] Age: 46 y.o. 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 Ardath Sax. At that time cultures were taken and XRay obtain for suspicion of osteomyelitis with plan to begin antibiotics on follow-up visit.  He states that the has had aching pain over left toes with new swelling and drainage from left toes.  He reports fevers, sweats, nausea, and vomiting.  Denies shortness of breath, chest pain, abdominal pain, change in bowel habits.  Reports an episode of "passing out" about a week ago of which he did not seek treatment.    Meds:  (Not in a hospital admission) No current facility-administered medications on file prior to encounter.   Current Outpatient Prescriptions on File Prior to Encounter  Medication Sig Dispense Refill  . gabapentin (NEURONTIN) 300 MG capsule Take 2 capsules (600 mg total) by mouth 2 (two) times daily.  60 capsule  3  . HYDROcodone-acetaminophen (VICODIN) 5-500 MG per tablet Take 1 tablet by mouth every 6 (six) hours as needed for pain.  20  tablet  0  . insulin aspart protamine-insulin aspart (NOVOLOG MIX 70/30) (70-30) 100 UNIT/ML injection Subcutaneously inject 35 units in the morning and 35 units before the evening meal  10 mL  6  . quinapril-hydrochlorothiazide (ACCURETIC) 20-12.5 MG per tablet Take 2 tablets by mouth daily.  60 tablet  6    Allergies: Allergies as of 08/21/2011  . (No Known Allergies)   Past Medical History  Diagnosis Date  . Onychomycosis   . Hypertension   . Diabetic peripheral neuropathy   . Diabetic foot ulcer 12/09    left plantar area  . Diabetes mellitus   . History of drug abuse     Cocaine and marijuana  . Tobacco abuse   . Exposure to trichomonas     treated empirically  . Cellulitis of left foot 03/2008    left 4th and 5th metatarsal area  . Hyperlipidemia   . 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 Ulcerative esophagitis 2007    severe, complicated with UGI bleed  . Mallory Janann August tear April 2009  . History of prolonged Q-T interval on ECG   . 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  . 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 Chronic renal insufficiency    Past Surgical History  Procedure Date  . Nephrectomy 7/10    left  . Toe amputation   . Nephrectomy     R side   History reviewed. No pertinent family history. History   Social History  .  Marital Status: Married    Spouse Name: N/A    Number of Children: N/A  . Years of Education: N/A   Occupational History  . Not on file.   Social History Main Topics  . Smoking status: Current Everyday Smoker -- 0.5 packs/day    Types: Cigarettes  . Smokeless tobacco: Not on file  . Alcohol Use: No  . Drug Use: Yes    Special: Marijuana  . Sexually Active: Not on file   Other Topics Concern  . Not on file   Social History Narrative  . No narrative on file    Review of Systems: Constitutional:  ++ fever, chills, diaphoresis, appetite change     HEENT: Denies photophobia, eye pain, redness, hearing loss, ear pain, congestion, sore throat, rhinorrhea, sneezing, mouth sores, trouble swallowing, neck pain, neck stiffness and tinnitus.  Respiratory: Denies SOB, DOE, cough, chest tightness, and wheezing.  Cardiovascular: Denies chest pain, palpitations and leg swelling.  Gastrointestinal: ++nausea, vomiting, Denies abdominal pain, diarrhea, constipation, blood in stool and abdominal distention.  Genitourinary: Denies dysuria, urgency, frequency, hematuria, flank pain and difficulty urinating.  Musculoskeletal: ++ left foot pain Denies myalgias  Skin: Wound to bottom of left foot Denies wound.  Neurological: ++syncope x1 a week ago, neuropathy pain in feet Denies dizziness, seizures, weakness, light-headedness, and headaches.     Physical Exam: Blood pressure 139/98, pulse 107, temperature 99.2 F (37.3 C), temperature source Oral, resp. rate 18, SpO2 95.00%. General: Well-developed, well-nourished, African American man, in no acute distress; lying flat in bed Head: Normocephalic, atraumatic. Eyes: PERRLA, EOMI,murky sclera Neck: supple, no masses appreciated Lungs: Normal respiratory effort. Clear to auscultation bilaterally from apices to bases without crackles or wheezes appreciated. Heart: normal rate, regular rhythm, normal S1 and S2, no gallop, murmur, or rubs appreciated. Abdomen: BS normoactive. Soft, Nondistended, non-tender. No masses or organomegaly appreciated. Extremities: No pretibial edema, pedal pulses intact bilaterally, Bilateral LE extremities warm to touch up to knees, bilateral feet with multiple areas of hyperpigmentation, lichenification and calluses on plantar surface and at great toe amputation sites, plantar aspect of left foot with quarter sized ulcer ~1cm deep, left 2-3 digits with crack fissures and oozing serosanguinous discharge  Neurologic: grossly non-focal, alert and oriented x3, appropriate and cooperative  throughout examination.    Lab results: Basic Metabolic Panel:  Basename 08/21/11 2257  NA 138  K 4.2  CL 105  CO2 --  GLUCOSE 200*  BUN 16  CREATININE 1.40*  CALCIUM --  MG --  PHOS --   CBC:  Basename 08/21/11 2257 08/21/11 2236  WBC -- 16.9*  NEUTROABS -- PENDING  HGB 11.9* 10.7*  HCT 35.0* 33.2*  MCV -- 89.5  PLT -- 515*   CBG:  Basename 08/21/11 2051  GLUCAP 182*   Urine Drug Screen: Drugs of Abuse     Component Value Date/Time   LABOPIA POSITIVE* 06/07/2010 1600   COCAINSCRNUR NONE DETECTED 06/07/2010 1600   COCAINSCRNUR NEG 09/22/2008 2125   LABBENZ NONE DETECTED 06/07/2010 1600   LABBENZ NEG 09/22/2008 2125   AMPHETMU NONE DETECTED 06/07/2010 1600   AMPHETMU NEG 09/22/2008 2125   THCU POSITIVE* 06/07/2010 1600   LABBARB  Value: NONE DETECTED        DRUG SCREEN FOR MEDICAL PURPOSES ONLY.  IF CONFIRMATION IS NEEDED FOR ANY PURPOSE, NOTIFY LAB WITHIN 5 DAYS.        LOWEST DETECTABLE LIMITS FOR URINE DRUG SCREEN Drug Class       Cutoff (ng/mL) Amphetamine  1000 Barbiturate      200 Benzodiazepine   200 Tricyclics       300 Opiates          300 Cocaine          300 THC              50 06/07/2010 1600     Imaging results:  Dg Foot Complete Left  08/21/2011  *RADIOLOGY REPORT*  Clinical Data: Diabetes.  Infection in all remaining toes.  LEFT FOOT - COMPLETE 3+ VIEW  Comparison: Left foot 08/18/2011 from [facility]  Findings: Postoperative changes of amputation of the left first toe at the level of the distal metatarsal bone.  As shown on the previous study, there is again evidence of focal erosion in the distal aspect of the first metatarsal bone, and in the second and third metatarsal heads.  Soft tissue swelling and soft tissue gas is suggested.  Changes suggest soft tissue infection with early osteomyelitis.  Callus formation and periosteal reaction along the distal aspect of the first through third metatarsal bones.  No evidence of acute fracture.   IMPRESSION: Prior amputation of the left first toe.  Bone erosions, sclerosis, and periosteal reaction in the distal left first through third metatarsal bones suggest early osteomyelitis.  Soft tissue swelling and soft tissue gas consistent with cellulitis.  Stable appearance since previous study.  Original Report Authenticated By: Marlon Pel, M.D.    Assessment & Plan by Problem: 1. Osteomyelitis/Cellulitis of Left foot: Secondary to poorly controlled DM. Patient has had multiple amputations of his toes in the past bilaterally. Most recent is the revision left great toe amputation to include metatarsal head on 08/31/2010 by Dr. August Saucer. Xray of left foot both on 5/10 & 5/13 show bone erosions, sclerosis, and periosteal reaction in the distal left first through third metatarsal bones suggest early osteomyelitis. Soft tissue swelling and soft tissue gas consistent with cellulitis". Patient received IV Clindamycin and Cipro in ED.  -Admit to med-surg floor  -Start Vancomycin & Zosyn per pharm to also cover for MRSA and pseudomonas  -Pain control with Percocet 5mg , 1-2 tabs q4h PRN  -Blood cultures x2, urinalysis, chest xray  -Check ESR  -Consult orthopedics in AM  -Send for wound culture   2. Diabetes Mellitus: uncontrolled due to medication noncompliance. Last HbA1c 12.7 in 03/2011. Microalbumin Cr ratio was 24.3 in 11/2007  -Will recheck HbA1c & micro Alb/Cr ratio  -Will continue home dose 70/30- 35 units bid  -May need to add sliding scale  -Continue ACEi  -Carb modified diet  -Reinforce diabetic education and the importance of medication adherence   3. Hypertension: adequately controlled, BP 139/98  -Will continue Quinapril-hctz 20/12.5 2 tablets daily   4. Chronic Kidney Disease: stable, likely 2/2 HTN & DM. Baseline Cr 1.2-1.6. Creatinine on admission is 1.4  -Will continue to monitor and tight control with BP and DM   5. Hx of polysubstance abuse:  -Will get UDS  -Substance abuse  counseling   6. Peripheral Neuropathy: 2/2 uncontrolled DM.  -Will continue home dose Gabapentin 600mg  bid   7. Anemia: likely chronic disease. Hb is 11.9 with baseline 10-11. Anemia panel in 06/2010 shows Ferritin of 228, iron 24, Sat ration 14, Folate 6.8, RBC folate 803, Vit B-12 232.  -Will continue to monitor CBC   8. Leukocytosis: WBC 16.8. This is 2/2 infection of left foot.  -Will continue Vanc and Zosyn and monitor CBC   9. Social  Issues: Patient has a history of medication noncompliance and no show for follow ups especially wound care clinic. Our clinic has spent great effort to assist patient financially such as gift card to [employer], free antibiotics for his infection, housing at the extended hotel, bus passes so he can come to his appointments. At some point, patient needs to be responsible for his own health. Will consult SW while he is in patient.   DVT ppx: heparin 5000 units SQ TID   Signed: Kristie Cowman 08/21/2011, 11:45 PM ,

## 2011-08-21 NOTE — ED Notes (Addendum)
Pt reports having calluses taken off of L foot recently; and then today on 3rd toe had swelling, and reports that "blew up" and started bleeding; reports having sores on bottom of foot and sees wound clinic; had xrays on Friday; states having fevers; pt has dressing on foot with post-op shoe; dressing with shadowing of blood; noted odor to foot

## 2011-08-21 NOTE — ED Provider Notes (Signed)
History     CSN: [id number]  Arrival date & time 08/21/11  1752   First MD Initiated Contact with Patient 08/21/11 2139      Chief Complaint  Patient presents with  . Foot Pain    (Consider location/radiation/quality/duration/timing/severity/associated sxs/prior treatment) HPI Pt reports he was seen by wound care 3 days ago for pain in his L foot. He had a callous removed and was given pain medications. He states he has had worsening pain, swelling and drainage over the weekend associated with intermittent fevers. He has severe aching pain, worse with movement. No known injury. He is diabetic with known neuropathy and is s/p bilateral great toe amputations for similar symptoms last year.   Past Medical History  Diagnosis Date  . Onychomycosis   . Hypertension   . Diabetic peripheral neuropathy   . Diabetic foot ulcer 12/09    left plantar area  . Diabetes mellitus   . History of drug abuse     Cocaine and marijuana  . Tobacco abuse   . Exposure to trichomonas     treated empirically  . Cellulitis of left foot 03/2008    left 4th and 5th metatarsal area  . Hyperlipidemia   . 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 Ulcerative esophagitis 2007    severe, complicated with UGI bleed  . Mallory Janann August tear April 2009  . History of prolonged Q-T interval on ECG   . 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  . 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 Chronic renal insufficiency     Past Surgical History  Procedure Date  . Nephrectomy 7/10    left  . Toe amputation   . Nephrectomy     R side    History reviewed. No pertinent family history.  History  Substance Use Topics  . Smoking status: Current Everyday Smoker -- 0.5 packs/day    Types: Cigarettes  . Smokeless tobacco: Not on file  . Alcohol Use: No      Review of Systems All other systems reviewed and are negative except as noted in  HPI.   Allergies  Review of patient's allergies indicates no known allergies.  Home Medications   Current Outpatient Rx  Name Route Sig Dispense Refill  . GABAPENTIN 300 MG PO CAPS Oral Take 2 capsules (600 mg total) by mouth 2 (two) times daily. 60 capsule 3  . HYDROCODONE-ACETAMINOPHEN 5-500 MG PO TABS Oral Take 1 tablet by mouth every 6 (six) hours as needed for pain. 20 tablet 0  . INSULIN ASPART PROT & ASPART (70-30) 100 UNIT/ML [place] SUSP  Subcutaneously inject 35 units in the morning and 35 units before the evening meal 10 mL 6  . QUINAPRIL-HYDROCHLOROTHIAZIDE 20-12.5 MG PO TABS Oral Take 2 tablets by mouth daily. 60 tablet 6    BP 141/87  Pulse 110  Temp(Src) 101 F (38.3 C) (Oral)  Resp 20  SpO2 98%  Physical Exam  Nursing note and vitals reviewed. Constitutional: He is oriented to person, place, and time. He appears well-developed and well-nourished.  HENT:  Head: Normocephalic and atraumatic.  Eyes: EOM are normal. Pupils are equal, round, and reactive to light.  Neck: Normal range of motion. Neck supple.  Cardiovascular: Normal rate, normal heart sounds and intact distal pulses.   Pulmonary/Chest: Effort normal and breath sounds normal.  Abdominal: Bowel sounds are normal. He exhibits no distension. There is no tenderness.  Musculoskeletal: Normal range of motion. He exhibits edema and tenderness.       2cm ulceration on plantar surface of the L foot with foul odor and purulent drainage  Neurological: He is alert and oriented to person, place, and time. He has normal strength. No cranial nerve deficit or sensory deficit.  Skin: Skin is warm and dry. No rash noted.  Psychiatric: He has a normal mood and affect.    ED Course  Procedures (including critical care time)  Labs Reviewed  GLUCOSE, CAPILLARY - Abnormal; Notable for the following:    Glucose-Capillary 182 (*)    All other components within normal limits  CBC  DIFFERENTIAL  CULTURE, BLOOD (ROUTINE X 2)    CULTURE, BLOOD (ROUTINE X 2)   No results found.   No diagnosis found.    MDM  Likely infected diabetic foot ulcer, now with fever. Will begin Abx after blood cultures. Xray to eval for osteomyelitis and plan admit for further treatment. Pain medications ordered.         Emet Rafanan B. Bernette Mayers, MD 08/21/11 2259

## 2011-08-21 NOTE — ED Notes (Signed)
Note sent to pharmacy for meds to be tubed to blue.

## 2011-08-22 ENCOUNTER — Inpatient Hospital Stay (HOSPITAL_COMMUNITY): Payer: Medicaid Other

## 2011-08-22 DIAGNOSIS — N189 Chronic kidney disease, unspecified: Secondary | ICD-10-CM

## 2011-08-22 DIAGNOSIS — I129 Hypertensive chronic kidney disease with stage 1 through stage 4 chronic kidney disease, or unspecified chronic kidney disease: Secondary | ICD-10-CM

## 2011-08-22 DIAGNOSIS — L089 Local infection of the skin and subcutaneous tissue, unspecified: Secondary | ICD-10-CM | POA: Diagnosis present

## 2011-08-22 DIAGNOSIS — E119 Type 2 diabetes mellitus without complications: Secondary | ICD-10-CM

## 2011-08-22 DIAGNOSIS — M869 Osteomyelitis, unspecified: Secondary | ICD-10-CM

## 2011-08-22 LAB — MICROALBUMIN / CREATININE URINE RATIO: Creatinine, Urine: 259 mg/dL

## 2011-08-22 LAB — RAPID URINE DRUG SCREEN, HOSP PERFORMED
Amphetamines: NOT DETECTED
Benzodiazepines: NOT DETECTED
Opiates: NOT DETECTED

## 2011-08-22 LAB — URINALYSIS, ROUTINE W REFLEX MICROSCOPIC
Glucose, UA: 250 mg/dL — AB
Hgb urine dipstick: NEGATIVE
Ketones, ur: 15 mg/dL — AB
Nitrite: NEGATIVE
pH: 5.5 (ref 5.0–8.0)

## 2011-08-22 LAB — CBC
Platelets: 419 10*3/uL — ABNORMAL HIGH (ref 150–400)
RBC: 3.3 MIL/uL — ABNORMAL LOW (ref 4.22–5.81)
RDW: 12.6 % (ref 11.5–15.5)
WBC: 14.7 10*3/uL — ABNORMAL HIGH (ref 4.0–10.5)

## 2011-08-22 LAB — HEPATIC FUNCTION PANEL
ALT: 10 U/L (ref 0–53)
Albumin: 2.5 g/dL — ABNORMAL LOW (ref 3.5–5.2)
Alkaline Phosphatase: 112 U/L (ref 39–117)
Total Bilirubin: 0.5 mg/dL (ref 0.3–1.2)

## 2011-08-22 LAB — GLUCOSE, CAPILLARY
Glucose-Capillary: 182 mg/dL — ABNORMAL HIGH (ref 70–99)
Glucose-Capillary: 196 mg/dL — ABNORMAL HIGH (ref 70–99)
Glucose-Capillary: 114 mg/dL — ABNORMAL HIGH (ref 70–99)

## 2011-08-22 LAB — URINE MICROSCOPIC-ADD ON

## 2011-08-22 LAB — BASIC METABOLIC PANEL
Calcium: 8.4 mg/dL (ref 8.4–10.5)
Creatinine, Ser: 1.24 mg/dL (ref 0.50–1.35)
GFR calc non Af Amer: 69 mL/min — ABNORMAL LOW (ref >90)
Sodium: 133 mEq/L — ABNORMAL LOW (ref 135–145)

## 2011-08-22 LAB — HEMOGLOBIN A1C: Mean Plasma Glucose: 361 mg/dL — ABNORMAL HIGH (ref <117)

## 2011-08-22 MED ORDER — NAPROXEN 375 MG PO TABS
375.0000 mg | ORAL_TABLET | Freq: Two times a day (BID) | ORAL | Status: DC
  Administered 2011-08-22 – 2011-08-24 (×4): 375 mg via ORAL
  Filled 2011-08-22 (×9): qty 1

## 2011-08-22 MED ORDER — PIPERACILLIN-TAZOBACTAM 3.375 G IVPB 30 MIN
3.3750 g | INTRAVENOUS | Status: AC
  Administered 2011-08-22: 3.375 g via INTRAVENOUS
  Filled 2011-08-22: qty 50

## 2011-08-22 MED ORDER — PIPERACILLIN-TAZOBACTAM 3.375 G IVPB
3.3750 g | Freq: Three times a day (TID) | INTRAVENOUS | Status: DC
  Administered 2011-08-22 – 2011-08-26 (×14): 3.375 g via INTRAVENOUS
  Filled 2011-08-22 (×17): qty 50

## 2011-08-22 MED ORDER — LISINOPRIL 40 MG PO TABS
40.0000 mg | ORAL_TABLET | Freq: Every day | ORAL | Status: DC
  Administered 2011-08-22 – 2011-08-26 (×5): 40 mg via ORAL
  Filled 2011-08-22 (×5): qty 1

## 2011-08-22 MED ORDER — INSULIN ASPART 100 UNIT/ML ~~LOC~~ SOLN
0.0000 [IU] | Freq: Three times a day (TID) | SUBCUTANEOUS | Status: DC
  Administered 2011-08-22: 3 [IU] via SUBCUTANEOUS
  Administered 2011-08-23: 1 [IU] via SUBCUTANEOUS
  Administered 2011-08-23: 3 [IU] via SUBCUTANEOUS
  Administered 2011-08-23: 7 [IU] via SUBCUTANEOUS
  Administered 2011-08-24: 5 [IU] via SUBCUTANEOUS
  Administered 2011-08-24: 2 [IU] via SUBCUTANEOUS
  Administered 2011-08-24: 3 [IU] via SUBCUTANEOUS

## 2011-08-22 MED ORDER — VANCOMYCIN HCL 1000 MG IV SOLR
1250.0000 mg | INTRAVENOUS | Status: AC
  Administered 2011-08-22: 1250 mg via INTRAVENOUS
  Filled 2011-08-22: qty 1250

## 2011-08-22 MED ORDER — OXYCODONE HCL 5 MG PO TABS
5.0000 mg | ORAL_TABLET | Freq: Four times a day (QID) | ORAL | Status: DC | PRN
  Administered 2011-08-26: 5 mg via ORAL
  Filled 2011-08-22: qty 1

## 2011-08-22 MED ORDER — VANCOMYCIN HCL 1000 MG IV SOLR
1250.0000 mg | Freq: Two times a day (BID) | INTRAVENOUS | Status: DC
  Administered 2011-08-22 – 2011-08-24 (×6): 1250 mg via INTRAVENOUS
  Filled 2011-08-22 (×7): qty 1250

## 2011-08-22 MED ORDER — QUINAPRIL-HYDROCHLOROTHIAZIDE 20-12.5 MG PO TABS: 2.0000 | ORAL_TABLET | Freq: Every day | ORAL | Status: DC

## 2011-08-22 MED ORDER — HYDROCHLOROTHIAZIDE 25 MG PO TABS
25.0000 mg | ORAL_TABLET | Freq: Every day | ORAL | Status: DC
  Administered 2011-08-22 – 2011-08-26 (×5): 25 mg via ORAL
  Filled 2011-08-22 (×5): qty 1

## 2011-08-22 MED ORDER — ONDANSETRON HCL 4 MG PO TABS: 4.0000 mg | ORAL_TABLET | Freq: Four times a day (QID) | ORAL | Status: DC | PRN

## 2011-08-22 MED ORDER — GABAPENTIN 300 MG PO CAPS
600.0000 mg | ORAL_CAPSULE | Freq: Two times a day (BID) | ORAL | Status: DC
  Administered 2011-08-22 – 2011-08-26 (×10): 600 mg via ORAL
  Filled 2011-08-22 (×11): qty 2

## 2011-08-22 MED ORDER — NICOTINE 21 MG/24HR TD PT24
21.0000 mg | MEDICATED_PATCH | Freq: Every day | TRANSDERMAL | Status: DC
  Administered 2011-08-22 – 2011-08-26 (×5): 21 mg via TRANSDERMAL
  Filled 2011-08-22 (×5): qty 1

## 2011-08-22 MED ORDER — HEPARIN SODIUM (PORCINE) 5000 UNIT/ML IJ SOLN
5000.0000 [IU] | Freq: Three times a day (TID) | INTRAMUSCULAR | Status: DC
  Administered 2011-08-22 – 2011-08-23 (×4): 5000 [IU] via SUBCUTANEOUS
  Filled 2011-08-22 (×7): qty 1

## 2011-08-22 MED ORDER — INSULIN ASPART PROT & ASPART (70-30 MIX) 100 UNIT/ML ~~LOC~~ SUSP
35.0000 [IU] | Freq: Two times a day (BID) | SUBCUTANEOUS | Status: DC
  Administered 2011-08-22 – 2011-08-25 (×5): 35 [IU] via SUBCUTANEOUS
  Filled 2011-08-22 (×2): qty 3

## 2011-08-22 MED ORDER — OXYCODONE HCL 5 MG PO TABS
10.0000 mg | ORAL_TABLET | ORAL | Status: DC | PRN
  Administered 2011-08-22 – 2011-08-26 (×15): 10 mg via ORAL
  Filled 2011-08-22 (×7): qty 2
  Filled 2011-08-22: qty 1
  Filled 2011-08-22 (×8): qty 2

## 2011-08-22 MED ORDER — ONDANSETRON HCL 4 MG/2ML IJ SOLN: 4.0000 mg | Freq: Four times a day (QID) | INTRAMUSCULAR | Status: DC | PRN

## 2011-08-22 MED ORDER — OXYCODONE-ACETAMINOPHEN 5-325 MG PO TABS
1.0000 | ORAL_TABLET | ORAL | Status: DC | PRN
  Administered 2011-08-22 (×3): 2 via ORAL
  Filled 2011-08-22 (×3): qty 2

## 2011-08-22 MED ORDER — ZOLPIDEM TARTRATE 5 MG PO TABS
5.0000 mg | ORAL_TABLET | Freq: Every evening | ORAL | Status: AC | PRN
Start: 2011-08-22 — End: 2011-08-22
  Administered 2011-08-22: 5 mg via ORAL
  Filled 2011-08-22: qty 1

## 2011-08-22 NOTE — Progress Notes (Signed)
ANTIBIOTIC CONSULT NOTE - INITIAL  Pharmacy Consult for Vancocin/Zosyn Indication: early osteomyelitis  No Known Allergies  Patient Measurements: Height: 5\' 8"  (172.7 cm) Weight: 205 lb 11 oz (93.3 kg) IBW/kg (Calculated) : 68.4   Vital Signs: Temp: 98.2 F (36.8 C) (05/14 0003) Temp src: Oral (05/14 0003) BP: 151/88 mmHg (05/14 0003) Pulse Rate: 99  (05/14 0003)  Labs:  Basename 08/21/11 2257 08/21/11 2236  WBC -- 16.9*  HGB 11.9* 10.7*  PLT -- 515*  LABCREA -- --  CREATININE 1.40* --   Estimated Creatinine Clearance: 73.9 ml/min (by C-G formula based on Cr of 1.4).  Microbiology: No results found for this or any previous visit (from the past 720 hour(s)).  Medical History: Past Medical History  Diagnosis Date  . Onychomycosis   . Hypertension   . Diabetic peripheral neuropathy   . Diabetic foot ulcer 12/09    left plantar area  . Diabetes mellitus   . History of drug abuse     Cocaine and marijuana  . Tobacco abuse   . Exposure to trichomonas     treated empirically  . Cellulitis of left foot 03/2008    left 4th and 5th metatarsal area  . Hyperlipidemia   . 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 Ulcerative esophagitis 2007    severe, complicated with UGI bleed  . Mallory Janann August tear April 2009  . History of prolonged Q-T interval on ECG   . 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  . 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 Chronic renal insufficiency     Medications:  Prescriptions prior to admission  Medication Sig Dispense Refill  . gabapentin (NEURONTIN) 300 MG capsule Take 2 capsules (600 mg total) by mouth 2 (two) times daily.  60 capsule  3  . HYDROcodone-acetaminophen (VICODIN) 5-500 MG per tablet Take 1 tablet by mouth every 6 (six) hours as needed for pain.  20 tablet  0  . insulin aspart protamine-insulin aspart (NOVOLOG MIX 70/30) (70-30) 100 UNIT/ML injection  Subcutaneously inject 35 units in the morning and 35 units before the evening meal  10 mL  6  . quinapril-hydrochlorothiazide (ACCURETIC) 20-12.5 MG per tablet Take 2 tablets by mouth daily.  60 tablet  6   Scheduled:    . acetaminophen  650 mg Oral Once  . ciprofloxacin  400 mg Intravenous Once  . gabapentin  600 mg Oral BID  . heparin  5,000 Units Subcutaneous Q8H  .  HYDROmorphone (DILAUDID) injection  1 mg Intravenous Once  . insulin aspart protamine-insulin aspart  35 Units Subcutaneous BID WC  . ondansetron  4 mg Intravenous Once  . piperacillin-tazobactam  3.375 g Intravenous NOW  . piperacillin-tazobactam (ZOSYN)  IV  3.375 g Intravenous Q8H  . quinapril-hydrochlorothiazide  2 tablet Oral Daily  . sodium chloride  1,000 mL Intravenous Once  . vancomycin  1,250 mg Intravenous NOW  . vancomycin  1,250 mg Intravenous Q12H  . DISCONTD: clindamycin (CLEOCIN) IV  900 mg Intravenous To Major   Assessment: 46yo male c/o persistent foot pain a few days after Xray showed possible early osteomyelitis in L foot, no ABX started at that time, now to begin IV ABX.  Goal of Therapy:  Vancomycin trough level 15-20 mcg/ml  Plan:  Will begin vancomycin 1250mg  IV Q12H and Zosyn 3.375g IV Q8H and monitor CBC, Cx, levels prn.  Colleen Can PharmD BCPS 08/22/2011,12:15 AM

## 2011-08-22 NOTE — H&P (Signed)
IM Attending  13 man with diabetic foot.  Has had several iterations of this and has lost both 1st toes.  Now has severe cellulitix and oozing ulcers of toes on left foot.  T max = 101.  WBC = 17,000.  Xray shows osteo and gas.  Fully alert and pleasant.  Understands the implications of this disease.

## 2011-08-22 NOTE — Consult Note (Signed)
Reason for Consult:  Left foot ulcer and osteomyelitis Referring Physician: teaching service  [name] is an 46 y.o. male.  HPI: Pt with history of amputation of the left great toe by Dr August Saucer.  Asked to see pt for ulcer of plantar aspect of left foot with radiographic findings of osteomyelitis of left foot.  Pt reports that recently he has not had the money to get his medications for diabetes and his blood sugars got out of control. Recent approval by his insurance to [facility] in [place] and was seen last Friday.  Has not been able to follow up with Dr August Saucer because of lack of transportation.  Pt noticed ulcer of the bottom of his foot a couple weeks ago, but has been watching it.  Over the past 2 days the left third toe became severely swollen and painful and began draining.  He presented to the ER for evaluation and was admitted for treatment of the left foot ulcer and osteomyelitis.  On admission elevated WBC and HGB A1c of 14.  Wound culture taken and gram stain with gram positive cocci and rods.  Radiographs of the left foot with findings of soft tissue gas and bone destruction of the first through the third metatarsals.  Past Medical History  Diagnosis Date  . Onychomycosis   . Hypertension   . Diabetic peripheral neuropathy   . Diabetic foot ulcer 12/09    left plantar area  . Diabetes mellitus   . History of drug abuse     Cocaine and marijuana  . Tobacco abuse   . Exposure to trichomonas     treated empirically  . Cellulitis of left foot 03/2008    left 4th and 5th metatarsal area  . Hyperlipidemia   . 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 Ulcerative esophagitis 2007    severe, complicated with UGI bleed  . Mallory Janann August tear April 2009  . History of prolonged Q-T interval on ECG   . 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  . 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 Chronic renal  insufficiency     Past Surgical History  Procedure Date  . Nephrectomy 7/10    left  . Toe amputation   . Nephrectomy     R side    History reviewed. No pertinent family history.  Social History:  reports that he has been smoking Cigarettes.  He has been smoking about .5 packs per day. He does not have any smokeless tobacco history on file. He reports that he uses illicit drugs (Marijuana). He reports that he does not drink alcohol.  Allergies: No Known Allergies  Medications: I have reviewed the patient's current medications.  Results for orders placed during the hospital encounter of 08/21/11 (from the past 48 hour(s))  GLUCOSE, CAPILLARY     Status: Abnormal   Collection Time   08/21/11  8:51 PM      Component Value Range Comment   Glucose-Capillary 182 (*) 70 - 99 (mg/dL)   CBC     Status: Abnormal   Collection Time   08/21/11 10:36 PM      Component Value Range Comment   WBC 16.9 (*) 4.0 - 10.5 (K/uL)    RBC 3.71 (*) 4.22 - 5.81 (MIL/uL)    Hemoglobin 10.7 (*) 13.0 - 17.0 (g/dL)    HCT 40.9 (*) 81.1 - 52.0 (%)  MCV 89.5  78.0 - 100.0 (fL)    MCH 28.8  26.0 - 34.0 (pg)    MCHC 32.2  30.0 - 36.0 (g/dL)    RDW 29.5  62.1 - 30.8 (%)    Platelets 515 (*) 150 - 400 (K/uL)   DIFFERENTIAL     Status: Abnormal   Collection Time   08/21/11 10:36 PM      Component Value Range Comment   Neutrophils Relative 78 (*) 43 - 77 (%)    Lymphocytes Relative 10 (*) 12 - 46 (%)    Monocytes Relative 10  3 - 12 (%)    Eosinophils Relative 2  0 - 5 (%)    Basophils Relative 0  0 - 1 (%)    Neutro Abs 13.2 (*) 1.7 - 7.7 (K/uL)    Lymphs Abs 1.7  0.7 - 4.0 (K/uL)    Monocytes Absolute 1.7 (*) 0.1 - 1.0 (K/uL)    Eosinophils Absolute 0.3  0.0 - 0.7 (K/uL)    Basophils Absolute 0.0  0.0 - 0.1 (K/uL)    WBC Morphology ATYPICAL LYMPHOCYTES     POCT I-STAT, CHEM 8     Status: Abnormal   Collection Time   08/21/11 10:57 PM      Component Value Range Comment   Sodium 138  135 - 145  (mEq/L)    Potassium 4.2  3.5 - 5.1 (mEq/L)    Chloride 105  96 - 112 (mEq/L)    BUN 16  6 - 23 (mg/dL)    Creatinine, Ser 6.57 (*) 0.50 - 1.35 (mg/dL)    Glucose, Bld 846 (*) 70 - 99 (mg/dL)    Calcium, Ion 9.62  1.12 - 1.32 (mmol/L)    TCO2 24  0 - 100 (mmol/L)    Hemoglobin 11.9 (*) 13.0 - 17.0 (g/dL)    HCT 95.2 (*) 84.1 - 52.0 (%)   SEDIMENTATION RATE     Status: Abnormal   Collection Time   08/22/11 12:06 AM      Component Value Range Comment   Sed Rate 110 (*) 0 - 16 (mm/hr)   HEMOGLOBIN A1C     Status: Abnormal   Collection Time   08/22/11 12:06 AM      Component Value Range Comment   Hemoglobin A1C 14.2 (*) <5.7 (%)    Mean Plasma Glucose 361 (*) <117 (mg/dL)   URINALYSIS, ROUTINE W REFLEX MICROSCOPIC     Status: Abnormal   Collection Time   08/22/11  2:25 AM      Component Value Range Comment   Color, Urine YELLOW  YELLOW     APPearance CLEAR  CLEAR     Specific Gravity, Urine 1.025  1.005 - 1.030     pH 5.5  5.0 - 8.0     Glucose, UA 250 (*) NEGATIVE (mg/dL)    Hgb urine dipstick NEGATIVE  NEGATIVE     Bilirubin Urine SMALL (*) NEGATIVE     Ketones, ur 15 (*) NEGATIVE (mg/dL)    Protein, ur 324 (*) NEGATIVE (mg/dL)    Urobilinogen, UA 1.0  0.0 - 1.0 (mg/dL)    Nitrite NEGATIVE  NEGATIVE     Leukocytes, UA NEGATIVE  NEGATIVE    URINE RAPID DRUG SCREEN (HOSP PERFORMED)     Status: Abnormal   Collection Time   08/22/11  2:25 AM      Component Value Range Comment   Opiates NONE DETECTED  NONE DETECTED     Cocaine NONE  DETECTED  NONE DETECTED     Benzodiazepines NONE DETECTED  NONE DETECTED     Amphetamines NONE DETECTED  NONE DETECTED     Tetrahydrocannabinol POSITIVE (*) NONE DETECTED     Barbiturates NONE DETECTED  NONE DETECTED    MICROALBUMIN / CREATININE URINE RATIO     Status: Abnormal   Collection Time   08/22/11  2:25 AM      Component Value Range Comment   Microalb, Ur 22.32 (*) 0.00 - 1.89 (mg/dL)    Creatinine, Urine 161.0      Microalb Creat Ratio  86.2 (*) 0.0 - 30.0 (mg/g)   URINE MICROSCOPIC-ADD ON     Status: Abnormal   Collection Time   08/22/11  2:25 AM      Component Value Range Comment   Squamous Epithelial / LPF RARE  RARE     WBC, UA 0-2  <3 (WBC/hpf)    RBC / HPF 0-2  <3 (RBC/hpf)    Bacteria, UA RARE  RARE     Casts HYALINE CASTS (*) NEGATIVE    GLUCOSE, CAPILLARY     Status: Abnormal   Collection Time   08/22/11  4:03 AM      Component Value Range Comment   Glucose-Capillary 182 (*) 70 - 99 (mg/dL)    Comment 1 Documented in Chart      Comment 2 Notify RN     WOUND CULTURE     Status: Normal (Preliminary result)   Collection Time   08/22/11  4:30 AM      Component Value Range Comment   Specimen Description WOUND LEFT FOOT      Special Requests Normal      Gram Stain        Value: RARE WBC PRESENT,BOTH PMN AND MONONUCLEAR     NO SQUAMOUS EPITHELIAL CELLS SEEN     FEW GRAM POSITIVE RODS     FEW GRAM NEGATIVE RODS     RARE GRAM POSITIVE COCCI IN PAIRS   Culture PENDING      Report Status PENDING     BASIC METABOLIC PANEL     Status: Abnormal   Collection Time   08/22/11  5:00 AM      Component Value Range Comment   Sodium 133 (*) 135 - 145 (mEq/L)    Potassium 4.3  3.5 - 5.1 (mEq/L)    Chloride 99  96 - 112 (mEq/L)    CO2 23  19 - 32 (mEq/L)    Glucose, Bld 206 (*) 70 - 99 (mg/dL)    BUN 13  6 - 23 (mg/dL)    Creatinine, Ser 9.60  0.50 - 1.35 (mg/dL)    Calcium 8.4  8.4 - 10.5 (mg/dL)    GFR calc non Af Amer 69 (*) >90 (mL/min)    GFR calc Af Amer 80 (*) >90 (mL/min)   CBC     Status: Abnormal   Collection Time   08/22/11  5:00 AM      Component Value Range Comment   WBC 14.7 (*) 4.0 - 10.5 (K/uL)    RBC 3.30 (*) 4.22 - 5.81 (MIL/uL)    Hemoglobin 9.4 (*) 13.0 - 17.0 (g/dL)    HCT 45.4 (*) 09.8 - 52.0 (%)    MCV 88.8  78.0 - 100.0 (fL)    MCH 28.5  26.0 - 34.0 (pg)    MCHC 32.1  30.0 - 36.0 (g/dL)    RDW 11.9  14.7 - 82.9 (%)  Platelets 419 (*) 150 - 400 (K/uL)   GLUCOSE, CAPILLARY     Status:  Abnormal   Collection Time   08/22/11  8:07 AM      Component Value Range Comment   Glucose-Capillary 196 (*) 70 - 99 (mg/dL)   HEPATIC FUNCTION PANEL     Status: Abnormal   Collection Time   08/22/11  9:22 AM      Component Value Range Comment   Total Protein 7.1  6.0 - 8.3 (g/dL)    Albumin 2.5 (*) 3.5 - 5.2 (g/dL)    AST 13  0 - 37 (U/L)    ALT 10  0 - 53 (U/L)    Alkaline Phosphatase 112  39 - 117 (U/L)    Total Bilirubin 0.5  0.3 - 1.2 (mg/dL)    Bilirubin, Direct 0.1  0.0 - 0.3 (mg/dL)    Indirect Bilirubin 0.4  0.3 - 0.9 (mg/dL)   GLUCOSE, CAPILLARY     Status: Abnormal   Collection Time   08/22/11 12:01 PM      Component Value Range Comment   Glucose-Capillary 135 (*) 70 - 99 (mg/dL)   GLUCOSE, CAPILLARY     Status: Abnormal   Collection Time   08/22/11  3:58 PM      Component Value Range Comment   Glucose-Capillary 220 (*) 70 - 99 (mg/dL)    Comment 1 Notify RN       X-ray Chest Pa And Lateral   08/22/2011  *RADIOLOGY REPORT*  Clinical Data: Fever  CHEST - 2 VIEW  Comparison: 06/13/2010  Findings: Right-sided PICC line has been removed.  Heart size normal.  The lungs are clear.  No pleural effusion.  No acute osseous finding.  IMPRESSION: No acute cardiopulmonary process.  Original Report Authenticated By: Harrel Lemon, M.D.   Dg Foot Complete Left  08/21/2011  *RADIOLOGY REPORT*  Clinical Data: Diabetes.  Infection in all remaining toes.  LEFT FOOT - COMPLETE 3+ VIEW  Comparison: Left foot 08/18/2011 from [facility]  Findings: Postoperative changes of amputation of the left first toe at the level of the distal metatarsal bone.  As shown on the previous study, there is again evidence of focal erosion in the distal aspect of the first metatarsal bone, and in the second and third metatarsal heads.  Soft tissue swelling and soft tissue gas is suggested.  Changes suggest soft tissue infection with early osteomyelitis.  Callus formation and periosteal reaction along  the distal aspect of the first through third metatarsal bones.  No evidence of acute fracture.  IMPRESSION: Prior amputation of the left first toe.  Bone erosions, sclerosis, and periosteal reaction in the distal left first through third metatarsal bones suggest early osteomyelitis.  Soft tissue swelling and soft tissue gas consistent with cellulitis.  Stable appearance since previous study.  Original Report Authenticated By: Marlon Pel, M.D.    Review of Systems  Constitutional: Negative.   HENT: Negative.   Eyes: Negative.   Respiratory: Negative.   Cardiovascular: Negative.   Gastrointestinal: Negative.   Genitourinary: Negative.   Musculoskeletal:       Wound of left foot and of right heel   Skin:       Dry skin of feet  Neurological: Negative.   Endo/Heme/Allergies: Negative.   Psychiatric/Behavioral: Negative.    Blood pressure 153/95, pulse 98, temperature 98.1 F (36.7 C), temperature source Axillary, resp. rate 18, height 5\' 8"  (1.727 m), weight 93.3 kg (205 lb 11  oz), SpO2 100.00%. Physical Exam  Left foot with amputation of great toe.  Dry eschar at incision site.  Plantar ulcer under third metatarsal head with purulence.  Probed wound down to bone.  Pt with little sensation of the foot at this area and did not hurt during probe to bone.  Purulence expressed from deep tissues at the area of the ulcer.  Third toe edematous and painful to touch.  Web space skin macerated without skin tears.  Palpation of midfoot causes pain and pt reports weight bearing on foot causes severe pain.  Left foot dressed with dry dressing.  Right foot also with great toe amputation.  Dry course skin of heel noted  Assessment/Plan: Left foot plantar ulcer with osteomyelitis.  Uncontrolled DM.  Discussed with pt the possible need for amputation.  He understands that he may require below knee amputation. Dr August Saucer will see pt in am for final decision for surgical intervention.  For now dry  dressing, elevation of foot and non weight bearing of left foot.  Cultures pending.  Medical team working on control of blood sugars. Pt on vancomycin and zosyn.    Karsen Nakanishi M 08/22/2011, 4:56 PM

## 2011-08-22 NOTE — Progress Notes (Signed)
CRITICAL VALUE ALERT  Critical value received: HgbA1C 14.5  Date of notification: 5.13.13  Time of notification: 1230  Critical value read back:yes  Nurse who received alert: L. Araceli Bouche, RN  MD notified (1st page): Dr. Richarda Blade  Time of first page: 1235  MD notified (2nd page):  Time of second page:  Responding MD: Dr . Richarda Blade  Time MD responded:

## 2011-08-22 NOTE — Clinical Social Work Psychosocial (Signed)
     Clinical Social Work Department BRIEF PSYCHOSOCIAL ASSESSMENT 08/22/2011  Patient:  [name], [name]     Account Number:  [id number]     Admit date:  08/21/2011  Clinical Social Worker:  Dennison Bulla  Date/Time:  08/22/2011 02:00 PM  Referred by:  Physician  Date Referred:  08/22/2011 Referred for  Transportation assistance   Other Referral:   Interview type:  Patient Other interview type:    PSYCHOSOCIAL DATA Living Status:  FAMILY Admitted from facility:   Level of care:   Primary support name:  Danetra Primary support relationship to patient:  SPOUSE Degree of support available:   Adequate    CURRENT CONCERNS Current Concerns  Other - See comment   Other Concerns:   Transportation    SOCIAL WORK ASSESSMENT / PLAN CSW received referral during progression meeting due to patient being non-compliant with medications and appointments due to not having transportation. When CSW attempted to meet with patient at first, patient was meeting with [facility] agent who was assisting patient. Agent reports she visits with patient at all hospitalizations. CSW spoke with patient alone at bedside after agent left.    Patient reported that he will have part of his foot amputated and will need to have follow up appointments at wound care to ensure foot is healing correctly. Patient reported he does not live near bus route. CSW spoke with patient regarding SCAT application. Patient was agreeable to complete application.    CSW completed application with patient. CSW faxed application to SCAT office. CSW gave original copy of application to patient. Patient reports no further needs. CSW is signing off.   Assessment/plan status:  No Further Intervention Required Other assessment/ plan:   Information/referral to community resources:   SCAT referral    PATIENTS/FAMILYS RESPONSE TO PLAN OF CARE: Patient was alert and oriented. Patient agreeable to SCAT  application. Patient understanding a fee associated with SCAT and of waiting time of application.

## 2011-08-22 NOTE — Progress Notes (Signed)
INTERNAL MEDICINE DAILY PROGRESS NOTE  BOOKERT GUZZI MRN: [id number] DOB/AGE: [birth year] 45 y.o.  Admit date: 08/21/2011  Subjective: [name] states his foot is hurting. Would like a sleep aid and nicotine patch.  Denies headache, nausea, vomiting, chest pain, shortness of breath, fever, chills, abdominal pain, swelling or other complaint.  Objective: Vital signs in last 24 hours: Filed Vitals:   08/21/11 2303 08/22/11 0003 08/22/11 0604 08/22/11 1320  BP: 139/98 151/88 130/76 153/95  Pulse:  99 83 98  Temp:  98.2 F (36.8 C) 98.3 F (36.8 C) 98.1 F (36.7 C)  TempSrc:  Oral Oral Axillary  Resp: 18 18 18 18   Height:  5\' 8"  (1.727 m)    Weight:  205 lb 11 oz (93.3 kg)    SpO2: 95% 98% 99% 100%   Weight change:   Intake/Output Summary (Last 24 hours) at 08/22/11 1709 Last data filed at 08/22/11 1319  Gross per 24 hour  Intake    300 ml  Output   1400 ml  Net  -1100 ml   Physical Exam: General: resting in bed Cardiac: RRR, no rubs, murmurs or gallops Pulm: clear to auscultation bilaterally, moving normal volumes of air Abd: soft, nontender, nondistended, BS present Ext: warm and well perfused, no pedal edema.  Right great toe amputation.  Purulent, malodorous, draining 2 cm ulcer on the plantar surface of the left foot just posterior and medial to the 1st metatarsal head.  Draining, purulent laceration between the 3rd and 4th phalanges of the left foot.  Neuro: alert and oriented X3, cranial nerves II-XII grossly intact  Lab Results: Basic Metabolic Panel:  Lab 08/22/11 6578 08/21/11 2257  NA 133* 138  K 4.3 4.2  CL 99 105  CO2 23 --  GLUCOSE 206* 200*  BUN 13 16  CREATININE 1.24 1.40*  CALCIUM 8.4 --  MG -- --  PHOS -- --   Liver Function Tests:  Lab 08/22/11 0922  AST 13  ALT 10  ALKPHOS 112  BILITOT 0.5  PROT 7.1  ALBUMIN 2.5*   CBC:  Lab 08/22/11 0500 08/21/11 2257 08/21/11 2236  WBC 14.7* -- 16.9*  NEUTROABS -- -- 13.2*  HGB 9.4* 11.9* --   HCT 29.3* 35.0* --  MCV 88.8 -- 89.5  PLT 419* -- 515*   CBG:  Lab 08/22/11 1558 08/22/11 1201 08/22/11 0807 08/22/11 0403 08/21/11 2051 08/18/11 1356  GLUCAP 220* 135* 196* 182* 182* 211*   Hemoglobin A1C:  Lab 08/22/11 0006  HGBA1C 14.2*   Fasting Lipid Panel: Lab Results  Component Value Date   CHOL 177 03/28/2011   HDL 61 03/28/2011   LDLCALC 100* 03/28/2011   TRIG 80 03/28/2011   CHOLHDL 2.9 03/28/2011   Urine Drug Screen: Drugs of Abuse     Component Value Date/Time   LABOPIA NONE DETECTED 08/22/2011 0225   COCAINSCRNUR NONE DETECTED 08/22/2011 0225   COCAINSCRNUR NEG 09/22/2008 2125   LABBENZ NONE DETECTED 08/22/2011 0225   LABBENZ NEG 09/22/2008 2125   AMPHETMU NONE DETECTED 08/22/2011 0225   AMPHETMU NEG 09/22/2008 2125   THCU POSITIVE* 08/22/2011 0225   LABBARB NONE DETECTED 08/22/2011 0225    Urinalysis:  Lab 08/22/11 0225  COLORURINE YELLOW  LABSPEC 1.025  PHURINE 5.5  GLUCOSEU 250*  HGBUR NEGATIVE  BILIRUBINUR SMALL*  KETONESUR 15*  PROTEINUR 100*  UROBILINOGEN 1.0  NITRITE NEGATIVE  LEUKOCYTESUR NEGATIVE    Micro Results: Recent Results (from the past 240 hour(s))  WOUND CULTURE  Status: Normal (Preliminary result)   Collection Time   08/22/11  4:30 AM      Component Value Range Status Comment   Specimen Description WOUND LEFT FOOT   Final    Special Requests Normal   Final    Gram Stain     Final    Value: RARE WBC PRESENT,BOTH PMN AND MONONUCLEAR     NO SQUAMOUS EPITHELIAL CELLS SEEN     FEW GRAM POSITIVE RODS     FEW GRAM NEGATIVE RODS     RARE GRAM POSITIVE COCCI IN PAIRS   Culture PENDING   Incomplete    Report Status PENDING   Incomplete    Studies/Results: X-ray Chest Pa And Lateral   08/22/2011  *RADIOLOGY REPORT*  Clinical Data: Fever  CHEST - 2 VIEW  Comparison: 06/13/2010  Findings: Right-sided PICC line has been removed.  Heart size normal.  The lungs are clear.  No pleural effusion.  No acute osseous finding.  IMPRESSION:  No acute cardiopulmonary process.  Original Report Authenticated By: Harrel Lemon, M.D.   Dg Foot Complete Left  08/21/2011  *RADIOLOGY REPORT*  Clinical Data: Diabetes.  Infection in all remaining toes.  LEFT FOOT - COMPLETE 3+ VIEW  Comparison: Left foot 08/18/2011 [facility]  Findings: Postoperative changes of amputation of the left first toe at the level of the distal metatarsal bone.  As shown on the previous study, there is again evidence of focal erosion in the distal aspect of the first metatarsal bone, and in the second and third metatarsal heads.  Soft tissue swelling and soft tissue gas is suggested.  Changes suggest soft tissue infection with early osteomyelitis.  Callus formation and periosteal reaction along the distal aspect of the first through third metatarsal bones.  No evidence of acute fracture.  IMPRESSION: Prior amputation of the left first toe.  Bone erosions, sclerosis, and periosteal reaction in the distal left first through third metatarsal bones suggest early osteomyelitis.  Soft tissue swelling and soft tissue gas consistent with cellulitis.  Stable appearance since previous study.  Original Report Authenticated By: Marlon Pel, M.D.   Medications: I have reviewed the patient's current medications. Scheduled Meds:    . acetaminophen  650 mg Oral Once  . ciprofloxacin  400 mg Intravenous Once  . gabapentin  600 mg Oral BID  . heparin  5,000 Units Subcutaneous Q8H  . lisinopril  40 mg Oral Daily   And  . hydrochlorothiazide  25 mg Oral Daily  .  HYDROmorphone (DILAUDID) injection  1 mg Intravenous Once  . insulin aspart  0-9 Units Subcutaneous TID WC  . insulin aspart protamine-insulin aspart  35 Units Subcutaneous BID WC  . naproxen  375 mg Oral BID WC  . nicotine  21 mg Transdermal Daily  . ondansetron  4 mg Intravenous Once  . piperacillin-tazobactam  3.375 g Intravenous NOW  . piperacillin-tazobactam (ZOSYN)  IV  3.375 g Intravenous Q8H    . sodium chloride  1,000 mL Intravenous Once  . vancomycin  1,250 mg Intravenous NOW  . vancomycin  1,250 mg Intravenous Q12H  . DISCONTD: clindamycin (CLEOCIN) IV  900 mg Intravenous To Major  . DISCONTD: quinapril-hydrochlorothiazide  2 tablet Oral Daily   Continuous Infusions:  PRN Meds:.ondansetron (ZOFRAN) IV, ondansetron, oxyCODONE, oxyCODONE, DISCONTD:  HYDROmorphone (DILAUDID) injection, DISCONTD: oxyCODONE-acetaminophen Assessment/Plan: Principal Problem: 1) Diabetic foot ulcer with probable Osteomyelitis of foot, acute, left - Likely will require amputation. Dr August Saucer from orthopedic surgery will  evaluate this patient will require BKA. Will obtain coags in anticipation of surgery. Optimally bone cultures will be taken at time of surgery, so that we can narrow therapy accordingly. Will optimize glucose control to assist in wound healing. -- Continue vancomycin and Zosyn for polymicrobial wound infection --  Optimize glucose control -- Will give pain control with OxyIR and naproxen. We'll need to watch renal function  Active Problems: 2) DM (diabetes mellitus), secondary, uncontrolled, with peripheral vascular complications - current A1c of greater than 14 shows very poor control do to patient being unable to afford medications and has not placing a priority on treating his diabetes. Will see how he does on insulin 70/30 35 units twice a day with meals. The patient appears to be relatively sensitive to insulin so we will titrate slowly. Urine microalbumin pending. -- Insulin 70/30 35 units twice daily -- Sliding scale insulin sensitive -- Will have CDE see patient while he is in-house -- Although Urine micro-albumen  3) HYPERTENSION - blood pressures since admission have been between the 130s and 150s over 70s to 90s systolic. Likely reflects patient's pain, fluid boluses, and acutely infected condition. We will continue to monitor his blood pressure and titrate up antihypertensives as  necessary. -- Continue home regimen  4) Chronic kidney disease, stage 2, mildly decreased GFR - at baseline renal function with creatinine of 1.2. Etiology is likely secondary to diabetes and hypertension. Will monitor creatinine closely given that the patient is on vancomycin and naproxen and ACE inhibitor. Will DC naproxen if necessary.  5) Inadequate material resources - patient's finances combined with his laxidaisical approach to treating his diabetes is the source of his current woes. Will obtain social work consult to assist patient in his medical care.   6) Anemia - likely secondary to anemia of chronic disease per anemia panel drawn March 2012. Will continue following with CBC.   7) DIABETIC PERIPHERAL NEUROPATHY - stable, but gabapentin restarted -- Gabapentin 600 mg twice a day  8) TOBACCO ABUSE - we'll provide nicotine patch  9) DVT prophylaxis - 3 times a day heparin  Disposition - pending surgical removal of infected tissue.     LOS: 1 day   Suzie Vandam 08/22/2011, 5:09 PM

## 2011-08-23 ENCOUNTER — Inpatient Hospital Stay (HOSPITAL_COMMUNITY): Payer: Medicaid Other

## 2011-08-23 DIAGNOSIS — L97509 Non-pressure chronic ulcer of other part of unspecified foot with unspecified severity: Secondary | ICD-10-CM

## 2011-08-23 DIAGNOSIS — E1169 Type 2 diabetes mellitus with other specified complication: Secondary | ICD-10-CM

## 2011-08-23 LAB — BASIC METABOLIC PANEL
GFR calc non Af Amer: 69 mL/min — ABNORMAL LOW (ref >90)
Glucose, Bld: 256 mg/dL — ABNORMAL HIGH (ref 70–99)
Potassium: 4.3 mEq/L (ref 3.5–5.1)
Sodium: 132 mEq/L — ABNORMAL LOW (ref 135–145)

## 2011-08-23 LAB — URINE CULTURE
Colony Count: NO GROWTH
Culture  Setup Time: 201305140509
Culture: NO GROWTH

## 2011-08-23 LAB — GLUCOSE, CAPILLARY
Glucose-Capillary: 123 mg/dL — ABNORMAL HIGH (ref 70–99)
Glucose-Capillary: 272 mg/dL — ABNORMAL HIGH (ref 70–99)
Glucose-Capillary: 320 mg/dL — ABNORMAL HIGH (ref 70–99)
Glucose-Capillary: 118 mg/dL — ABNORMAL HIGH (ref 70–99)

## 2011-08-23 LAB — PROTIME-INR
INR: 1 (ref 0.00–1.49)
Prothrombin Time: 13.4 seconds (ref 11.6–15.2)

## 2011-08-23 LAB — DIFFERENTIAL
Basophils Absolute: 0.1 10*3/uL (ref 0.0–0.1)
Basophils Relative: 1 % (ref 0–1)
Eosinophils Absolute: 0.4 10*3/uL (ref 0.0–0.7)
Lymphocytes Relative: 17 % (ref 12–46)
Neutrophils Relative %: 72 % (ref 43–77)

## 2011-08-23 LAB — CBC
Hemoglobin: 9.3 g/dL — ABNORMAL LOW (ref 13.0–17.0)
MCHC: 32.4 g/dL (ref 30.0–36.0)
RDW: 12.5 % (ref 11.5–15.5)

## 2011-08-23 MED ORDER — ZOLPIDEM TARTRATE 5 MG PO TABS
5.0000 mg | ORAL_TABLET | Freq: Every evening | ORAL | Status: DC | PRN
  Administered 2011-08-24 – 2011-08-25 (×2): 5 mg via ORAL
  Filled 2011-08-23 (×2): qty 1

## 2011-08-23 MED ORDER — GADOBENATE DIMEGLUMINE 529 MG/ML IV SOLN
20.0000 mL | Freq: Once | INTRAVENOUS | Status: AC
  Administered 2011-08-23: 20 mL via INTRAVENOUS

## 2011-08-23 NOTE — Consult Note (Signed)
Reason for Consult:left foot infection Referring Physician: Dr. Arvin [name] is an 46 y.o. male.  HPI: [name] is a 46 year old patient with poorly controlled diabetes who is admitted for treatment of left foot infection. The patient describes swelling and drainage from the left foot for approximately 10 days. He was seen at the wound center where they recommended further medical evaluation in a hospital setting. The wife to bring him to the hospital where he has been admitted IV antibiotics have been started. He denies any right foot pain but doesn't get noncompliance with his medical regimen. He is currently unemployed but does desire vocational rehabilitation training. He is with his wife/partner who is in the room. 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 This is performed approximately a year ago. He denies any history of trauma to the foot.  Past Medical History  Diagnosis Date  . Onychomycosis   . Hypertension   . Diabetic peripheral neuropathy   . Diabetic foot ulcer 12/09    left plantar area  . Diabetes mellitus   . History of drug abuse     Cocaine and marijuana  . Tobacco abuse   . Exposure to trichomonas     treated empirically  . Cellulitis of left foot 03/2008    left 4th and 5th metatarsal area  . Hyperlipidemia   . 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 Ulcerative esophagitis 2007    severe, complicated with UGI bleed  . Mallory Janann August tear April 2009  . History of prolonged Q-T interval on ECG   . 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  . 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 Chronic renal insufficiency     Past Surgical History  Procedure Date  . Nephrectomy 7/10    left  . Toe amputation   . Nephrectomy     R side    History reviewed. No pertinent family history.  Social History:  reports that he has  been smoking Cigarettes.  He has been smoking about .5 packs per day. He does not have any smokeless tobacco history on file. He reports that he uses illicit drugs (Marijuana). He reports that he does not drink alcohol.  Allergies: No Known Allergies  Medications: I have reviewed the patient's current medications.  Results for orders placed during the hospital encounter of 08/21/11 (from the past 48 hour(s))  GLUCOSE, CAPILLARY     Status: Abnormal   Collection Time   08/21/11  8:51 PM      Component Value Range Comment   Glucose-Capillary 182 (*) 70 - 99 (mg/dL)   CBC     Status: Abnormal   Collection Time   08/21/11 10:36 PM      Component Value Range Comment   WBC 16.9 (*) 4.0 - 10.5 (K/uL)    RBC 3.71 (*) 4.22 - 5.81 (MIL/uL)    Hemoglobin 10.7 (*) 13.0 - 17.0 (g/dL)    HCT 16.1 (*) 09.6 - 52.0 (%)    MCV 89.5  78.0 - 100.0 (fL)    MCH 28.8  26.0 - 34.0 (pg)    MCHC 32.2  30.0 - 36.0 (g/dL)    RDW 04.5  40.9 - 81.1 (%)    Platelets 515 (*) 150 - 400 (K/uL)   DIFFERENTIAL     Status: Abnormal   Collection Time   08/21/11  10:36 PM      Component Value Range Comment   Neutrophils Relative 78 (*) 43 - 77 (%)    Lymphocytes Relative 10 (*) 12 - 46 (%)    Monocytes Relative 10  3 - 12 (%)    Eosinophils Relative 2  0 - 5 (%)    Basophils Relative 0  0 - 1 (%)    Neutro Abs 13.2 (*) 1.7 - 7.7 (K/uL)    Lymphs Abs 1.7  0.7 - 4.0 (K/uL)    Monocytes Absolute 1.7 (*) 0.1 - 1.0 (K/uL)    Eosinophils Absolute 0.3  0.0 - 0.7 (K/uL)    Basophils Absolute 0.0  0.0 - 0.1 (K/uL)    WBC Morphology ATYPICAL LYMPHOCYTES     CULTURE, BLOOD (ROUTINE X 2)     Status: Normal (Preliminary result)   Collection Time   08/21/11 10:40 PM      Component Value Range Comment   Specimen Description BLOOD RIGHT ARM      Special Requests BOTTLES DRAWN AEROBIC AND ANAEROBIC 5CC EACH      Culture  Setup Time [id number]      Culture        Value:        BLOOD CULTURE RECEIVED NO GROWTH TO DATE CULTURE  WILL BE HELD FOR 5 DAYS BEFORE ISSUING A FINAL NEGATIVE REPORT   Report Status PENDING     CULTURE, BLOOD (ROUTINE X 2)     Status: Normal (Preliminary result)   Collection Time   08/21/11 10:45 PM      Component Value Range Comment   Specimen Description BLOOD LEFT ARM      Special Requests BOTTLES DRAWN AEROBIC AND ANAEROBIC 3CC EACH      Culture  Setup Time [id number]      Culture        Value:        BLOOD CULTURE RECEIVED NO GROWTH TO DATE CULTURE WILL BE HELD FOR 5 DAYS BEFORE ISSUING A FINAL NEGATIVE REPORT   Report Status PENDING     POCT I-STAT, CHEM 8     Status: Abnormal   Collection Time   08/21/11 10:57 PM      Component Value Range Comment   Sodium 138  135 - 145 (mEq/L)    Potassium 4.2  3.5 - 5.1 (mEq/L)    Chloride 105  96 - 112 (mEq/L)    BUN 16  6 - 23 (mg/dL)    Creatinine, Ser 9.21 (*) 0.50 - 1.35 (mg/dL)    Glucose, Bld 194 (*) 70 - 99 (mg/dL)    Calcium, Ion 1.74  1.12 - 1.32 (mmol/L)    TCO2 24  0 - 100 (mmol/L)    Hemoglobin 11.9 (*) 13.0 - 17.0 (g/dL)    HCT 08.1 (*) 44.8 - 52.0 (%)   SEDIMENTATION RATE     Status: Abnormal   Collection Time   08/22/11 12:06 AM      Component Value Range Comment   Sed Rate 110 (*) 0 - 16 (mm/hr)   HEMOGLOBIN A1C     Status: Abnormal   Collection Time   08/22/11 12:06 AM      Component Value Range Comment   Hemoglobin A1C 14.2 (*) <5.7 (%)    Mean Plasma Glucose 361 (*) <117 (mg/dL)   URINALYSIS, ROUTINE W REFLEX MICROSCOPIC     Status: Abnormal   Collection Time   08/22/11  2:25 AM      Component Value  Range Comment   Color, Urine YELLOW  YELLOW     APPearance CLEAR  CLEAR     Specific Gravity, Urine 1.025  1.005 - 1.030     pH 5.5  5.0 - 8.0     Glucose, UA 250 (*) NEGATIVE (mg/dL)    Hgb urine dipstick NEGATIVE  NEGATIVE     Bilirubin Urine SMALL (*) NEGATIVE     Ketones, ur 15 (*) NEGATIVE (mg/dL)    Protein, ur 782 (*) NEGATIVE (mg/dL)    Urobilinogen, UA 1.0  0.0 - 1.0 (mg/dL)    Nitrite NEGATIVE   NEGATIVE     Leukocytes, UA NEGATIVE  NEGATIVE    URINE CULTURE     Status: Normal   Collection Time   08/22/11  2:25 AM      Component Value Range Comment   Specimen Description URINE, RANDOM      Special Requests NONE      Culture  Setup Time [id number]      Colony Count NO GROWTH      Culture NO GROWTH      Report Status 08/23/2011 FINAL     URINE RAPID DRUG SCREEN (HOSP PERFORMED)     Status: Abnormal   Collection Time   08/22/11  2:25 AM      Component Value Range Comment   Opiates NONE DETECTED  NONE DETECTED     Cocaine NONE DETECTED  NONE DETECTED     Benzodiazepines NONE DETECTED  NONE DETECTED     Amphetamines NONE DETECTED  NONE DETECTED     Tetrahydrocannabinol POSITIVE (*) NONE DETECTED     Barbiturates NONE DETECTED  NONE DETECTED    MICROALBUMIN / CREATININE URINE RATIO     Status: Abnormal   Collection Time   08/22/11  2:25 AM      Component Value Range Comment   Microalb, Ur 22.32 (*) 0.00 - 1.89 (mg/dL)    Creatinine, Urine 469.6      Microalb Creat Ratio 86.2 (*) 0.0 - 30.0 (mg/g)   URINE MICROSCOPIC-ADD ON     Status: Abnormal   Collection Time   08/22/11  2:25 AM      Component Value Range Comment   Squamous Epithelial / LPF RARE  RARE     WBC, UA 0-2  <3 (WBC/hpf)    RBC / HPF 0-2  <3 (RBC/hpf)    Bacteria, UA RARE  RARE     Casts HYALINE CASTS (*) NEGATIVE    GLUCOSE, CAPILLARY     Status: Abnormal   Collection Time   08/22/11  4:03 AM      Component Value Range Comment   Glucose-Capillary 182 (*) 70 - 99 (mg/dL)    Comment 1 Documented in Chart      Comment 2 Notify RN     WOUND CULTURE     Status: Normal (Preliminary result)   Collection Time   08/22/11  4:30 AM      Component Value Range Comment   Specimen Description WOUND LEFT FOOT      Special Requests Normal      Gram Stain        Value: RARE WBC PRESENT,BOTH PMN AND MONONUCLEAR     NO SQUAMOUS EPITHELIAL CELLS SEEN     FEW GRAM POSITIVE RODS     FEW GRAM NEGATIVE RODS     RARE GRAM  POSITIVE COCCI IN PAIRS   Culture Culture reincubated for better growth      Report  Status PENDING     BASIC METABOLIC PANEL     Status: Abnormal   Collection Time   08/22/11  5:00 AM      Component Value Range Comment   Sodium 133 (*) 135 - 145 (mEq/L)    Potassium 4.3  3.5 - 5.1 (mEq/L)    Chloride 99  96 - 112 (mEq/L)    CO2 23  19 - 32 (mEq/L)    Glucose, Bld 206 (*) 70 - 99 (mg/dL)    BUN 13  6 - 23 (mg/dL)    Creatinine, Ser 1.61  0.50 - 1.35 (mg/dL)    Calcium 8.4  8.4 - 10.5 (mg/dL)    GFR calc non Af Amer 69 (*) >90 (mL/min)    GFR calc Af Amer 80 (*) >90 (mL/min)   CBC     Status: Abnormal   Collection Time   08/22/11  5:00 AM      Component Value Range Comment   WBC 14.7 (*) 4.0 - 10.5 (K/uL)    RBC 3.30 (*) 4.22 - 5.81 (MIL/uL)    Hemoglobin 9.4 (*) 13.0 - 17.0 (g/dL)    HCT 09.6 (*) 04.5 - 52.0 (%)    MCV 88.8  78.0 - 100.0 (fL)    MCH 28.5  26.0 - 34.0 (pg)    MCHC 32.1  30.0 - 36.0 (g/dL)    RDW 40.9  81.1 - 91.4 (%)    Platelets 419 (*) 150 - 400 (K/uL)   GLUCOSE, CAPILLARY     Status: Abnormal   Collection Time   08/22/11  8:07 AM      Component Value Range Comment   Glucose-Capillary 196 (*) 70 - 99 (mg/dL)   HEPATIC FUNCTION PANEL     Status: Abnormal   Collection Time   08/22/11  9:22 AM      Component Value Range Comment   Total Protein 7.1  6.0 - 8.3 (g/dL)    Albumin 2.5 (*) 3.5 - 5.2 (g/dL)    AST 13  0 - 37 (U/L)    ALT 10  0 - 53 (U/L)    Alkaline Phosphatase 112  39 - 117 (U/L)    Total Bilirubin 0.5  0.3 - 1.2 (mg/dL)    Bilirubin, Direct 0.1  0.0 - 0.3 (mg/dL)    Indirect Bilirubin 0.4  0.3 - 0.9 (mg/dL)   GLUCOSE, CAPILLARY     Status: Abnormal   Collection Time   08/22/11 12:01 PM      Component Value Range Comment   Glucose-Capillary 135 (*) 70 - 99 (mg/dL)   GLUCOSE, CAPILLARY     Status: Abnormal   Collection Time   08/22/11  3:58 PM      Component Value Range Comment   Glucose-Capillary 220 (*) 70 - 99 (mg/dL)    Comment 1 Notify  RN     GLUCOSE, CAPILLARY     Status: Abnormal   Collection Time   08/22/11  9:36 PM      Component Value Range Comment   Glucose-Capillary 114 (*) 70 - 99 (mg/dL)    Comment 1 Notify RN      Comment 2 Documented in Chart     GLUCOSE, CAPILLARY     Status: Abnormal   Collection Time   08/23/11  4:09 AM      Component Value Range Comment   Glucose-Capillary 256 (*) 70 - 99 (mg/dL)    Comment 1 Documented in Chart  Comment 2 Notify RN     BASIC METABOLIC PANEL     Status: Abnormal   Collection Time   08/23/11  5:40 AM      Component Value Range Comment   Sodium 132 (*) 135 - 145 (mEq/L)    Potassium 4.3  3.5 - 5.1 (mEq/L)    Chloride 99  96 - 112 (mEq/L)    CO2 24  19 - 32 (mEq/L)    Glucose, Bld 256 (*) 70 - 99 (mg/dL)    BUN 11  6 - 23 (mg/dL)    Creatinine, Ser 1.61  0.50 - 1.35 (mg/dL)    Calcium 8.6  8.4 - 10.5 (mg/dL)    GFR calc non Af Amer 69 (*) >90 (mL/min)    GFR calc Af Amer 80 (*) >90 (mL/min)   APTT     Status: Normal   Collection Time   08/23/11  5:40 AM      Component Value Range Comment   aPTT 31  24 - 37 (seconds)   PROTIME-INR     Status: Normal   Collection Time   08/23/11  5:40 AM      Component Value Range Comment   Prothrombin Time 13.4  11.6 - 15.2 (seconds)    INR 1.00  0.00 - 1.49    CBC     Status: Abnormal   Collection Time   08/23/11  5:40 AM      Component Value Range Comment   WBC 13.2 (*) 4.0 - 10.5 (K/uL)    RBC 3.24 (*) 4.22 - 5.81 (MIL/uL)    Hemoglobin 9.3 (*) 13.0 - 17.0 (g/dL)    HCT 09.6 (*) 04.5 - 52.0 (%)    MCV 88.6  78.0 - 100.0 (fL)    MCH 28.7  26.0 - 34.0 (pg)    MCHC 32.4  30.0 - 36.0 (g/dL)    RDW 40.9  81.1 - 91.4 (%)    Platelets 464 (*) 150 - 400 (K/uL)   DIFFERENTIAL     Status: Abnormal   Collection Time   08/23/11  5:40 AM      Component Value Range Comment   Neutrophils Relative 72  43 - 77 (%)    Lymphocytes Relative 17  12 - 46 (%)    Monocytes Relative 7  3 - 12 (%)    Eosinophils Relative 3  0 - 5 (%)      Basophils Relative 1  0 - 1 (%)    Neutro Abs 9.6 (*) 1.7 - 7.7 (K/uL)    Lymphs Abs 2.2  0.7 - 4.0 (K/uL)    Monocytes Absolute 0.9  0.1 - 1.0 (K/uL)    Eosinophils Absolute 0.4  0.0 - 0.7 (K/uL)    Basophils Absolute 0.1  0.0 - 0.1 (K/uL)    Smear Review MORPHOLOGY UNREMARKABLE     GLUCOSE, CAPILLARY     Status: Abnormal   Collection Time   08/23/11  8:03 AM      Component Value Range Comment   Glucose-Capillary 215 (*) 70 - 99 (mg/dL)    Comment 1 Notify RN       X-ray Chest Pa And Lateral   08/22/2011  *RADIOLOGY REPORT*  Clinical Data: Fever  CHEST - 2 VIEW  Comparison: 06/13/2010  Findings: Right-sided PICC line has been removed.  Heart size normal.  The lungs are clear.  No pleural effusion.  No acute osseous finding.  IMPRESSION: No acute cardiopulmonary process.  Original Report Authenticated  By: Harrel Lemon, M.D.   Dg Foot Complete Left  08/21/2011  *RADIOLOGY REPORT*  Clinical Data: Diabetes.  Infection in all remaining toes.  LEFT FOOT - COMPLETE 3+ VIEW  Comparison: Left foot 08/18/2011 from [facility]  Findings: Postoperative changes of amputation of the left first toe at the level of the distal metatarsal bone.  As shown on the previous study, there is again evidence of focal erosion in the distal aspect of the first metatarsal bone, and in the second and third metatarsal heads.  Soft tissue swelling and soft tissue gas is suggested.  Changes suggest soft tissue infection with early osteomyelitis.  Callus formation and periosteal reaction along the distal aspect of the first through third metatarsal bones.  No evidence of acute fracture.  IMPRESSION: Prior amputation of the left first toe.  Bone erosions, sclerosis, and periosteal reaction in the distal left first through third metatarsal bones suggest early osteomyelitis.  Soft tissue swelling and soft tissue gas consistent with cellulitis.  Stable appearance since previous study.  Original Report Authenticated  By: Marlon Pel, M.D.    Review of Systems  Constitutional: Positive for fever.  HENT: Negative.   Eyes: Negative.   Respiratory: Positive for cough.   Cardiovascular: Negative.   Gastrointestinal: Negative.   Genitourinary: Negative.   Musculoskeletal: Positive for joint pain.  Skin: Negative.   Neurological: Negative.   Endo/Heme/Allergies: Negative.   Psychiatric/Behavioral: Negative.    Blood pressure 138/73, pulse 90, temperature 98.2 F (36.8 C), temperature source Oral, resp. rate 18, height 5\' 8"  (1.727 m), weight 93.3 kg (205 lb 11 oz), SpO2 100.00%. Physical Exam  Constitutional: He appears well-developed.  HENT:  Head: Normocephalic.  Eyes: Pupils are equal, round, and reactive to light.  Neck: Normal range of motion.  Cardiovascular: Normal rate.   Respiratory: Effort normal.  GI: Soft.   examination the left foot demonstrates swelling dorsally and plantarly distal around the toes. There is a plantar ulcer at the base of the fourth metatarsal. Mild drainage and motor is present. There is no gas in the soft tissues. Patient does have some tenderness to palpation around the heel. Compartments are soft foot is perfused at generally insensate on the dorsum plantar surface. On the right-hand side the patient does have callus around the heel which is trimmed at the wound center. He does have active dorsiflexion plantarflexion.  Assessment/Plan: Impression is left foot infection in a patient with hemoglobin A1c of 14 white count 18,000 present on admission. He is admitted for antibiotics. 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 He does have a plantar ulcer over the fourth metatarsal head. This is a significant problem for [name] at his multiple medical comorbidities. He'll need surgical intervention which will be either some type of transmetatarsal/thymic dictation for a BKA.  Would like to get an MRI scan to evaluate the proximal extent of the infection in the foot. His midfoot and heel tenderness is somewhat concerning. His general noncompliance would argue for definitive procedure on the first setting. 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 Plan is for MRI scan continued IV antibiotics hold subcutaneous heparin for surgery which will likely be tomorrow. Medical decision making complicated by complexity of his medical problems and decision for surgery.  Candence Sease SCOTT 08/23/2011, 8:14 AM

## 2011-08-23 NOTE — Progress Notes (Signed)
INTERNAL MEDICINE DAILY PROGRESS NOTE  [name] MRN: [id number] DOB/AGE: 1965-06-23 45 y.o.  Admit date: 08/21/2011  Subjective: [name] states his pain control is much better today. Slept well with ambien. Dr. August Saucer wants MRI to eval extent of osteo. Maybe can save heel.     Denies headache, nausea, vomiting, chest pain, shortness of breath, fever, chills, abdominal pain, swelling or other complaint.  Objective: Vital signs in last 24 hours: Filed Vitals:   08/22/11 0604 08/22/11 1320 08/22/11 2137 08/23/11 0539  BP: 130/76 153/95 116/82 138/73  Pulse: 83 98 99 90  Temp: 98.3 F (36.8 C) 98.1 F (36.7 C) 98.4 F (36.9 C) 98.2 F (36.8 C)  TempSrc: Oral Axillary Oral Oral  Resp: 18 18 18 18   Height:      Weight:      SpO2: 99% 100% 100% 100%   Weight change:   Intake/Output Summary (Last 24 hours) at 08/23/11 0847 Last data filed at 08/22/11 2139  Gross per 24 hour  Intake    610 ml  Output    975 ml  Net   -365 ml   Physical Exam: General: resting in bed Cardiac: RRR, no rubs, murmurs or gallops Pulm: clear to auscultation bilaterally, moving normal volumes of air Abd: soft, nontender, nondistended, BS present Ext: warm and well perfused, no pedal edema.  Right great toe amputation.  Purulent, malodorous, draining 2 cm ulcer on the plantar surface of the left foot just posterior and medial to the 4th metatarsal head.  Draining, purulent laceration between the 3rd and 4th phalanges of the left foot. Both leasions appear better than last night after cleaning. Neuro: alert and oriented X3, cranial nerves II-XII grossly intact  Lab Results: Basic Metabolic Panel:  Lab 08/23/11 5284 08/22/11 0500  NA 132* 133*  K 4.3 4.3  CL 99 99  CO2 24 23  GLUCOSE 256* 206*  BUN 11 13  CREATININE 1.23 1.24  CALCIUM 8.6 8.4  MG -- --  PHOS -- --   Liver Function Tests:  Lab 08/22/11 0922  AST 13  ALT 10  ALKPHOS 112  BILITOT 0.5  PROT 7.1  ALBUMIN 2.5*    CBC:  Lab 08/23/11 0540 08/22/11 0500 08/21/11 2236  WBC 13.2* 14.7* --  NEUTROABS 9.6* -- 13.2*  HGB 9.3* 9.4* --  HCT 28.7* 29.3* --  MCV 88.6 88.8 --  PLT 464* 419* --   CBG:  Lab 08/23/11 0803 08/23/11 0409 08/22/11 2136 08/22/11 1558 08/22/11 1201 08/22/11 0807  GLUCAP 215* 256* 114* 220* 135* 196*   Hemoglobin A1C:  Lab 08/22/11 0006  HGBA1C 14.2*   Fasting Lipid Panel: Lab Results  Component Value Date   CHOL 177 03/28/2011   HDL 61 03/28/2011   LDLCALC 100* 03/28/2011   TRIG 80 03/28/2011   CHOLHDL 2.9 03/28/2011   Urine Drug Screen: Drugs of Abuse     Component Value Date/Time   LABOPIA NONE DETECTED 08/22/2011 0225   COCAINSCRNUR NONE DETECTED 08/22/2011 0225   COCAINSCRNUR NEG 09/22/2008 2125   LABBENZ NONE DETECTED 08/22/2011 0225   LABBENZ NEG 09/22/2008 2125   AMPHETMU NONE DETECTED 08/22/2011 0225   AMPHETMU NEG 09/22/2008 2125   THCU POSITIVE* 08/22/2011 0225   LABBARB NONE DETECTED 08/22/2011 0225    Urinalysis:  Lab 08/22/11 0225  COLORURINE YELLOW  LABSPEC 1.025  PHURINE 5.5  GLUCOSEU 250*  HGBUR NEGATIVE  BILIRUBINUR SMALL*  KETONESUR 15*  PROTEINUR 100*  UROBILINOGEN 1.0  NITRITE  NEGATIVE  LEUKOCYTESUR NEGATIVE   Results for [name], [name] (MRN [id number]) as of 08/23/2011 08:54  Ref. Range 08/22/2011 02:25  Microalb, Ur Latest Range: 0.00-1.89 mg/dL 28.41 (H)  Microalb Creat Ratio Latest Range: 0.0-30.0 mg/g 86.2 (H)  Creatinine, Urine No range found 259.0    Micro Results: Recent Results (from the past 240 hour(s))  CULTURE, BLOOD (ROUTINE X 2)     Status: Normal (Preliminary result)   Collection Time   08/21/11 10:40 PM      Component Value Range Status Comment   Specimen Description BLOOD RIGHT ARM   Final    Special Requests BOTTLES DRAWN AEROBIC AND ANAEROBIC 5CC EACH   Final    Culture  Setup Time [id number]   Final    Culture     Final    Value:        BLOOD CULTURE RECEIVED NO GROWTH TO DATE CULTURE WILL BE HELD FOR 5  DAYS BEFORE ISSUING A FINAL NEGATIVE REPORT   Report Status PENDING   Incomplete   CULTURE, BLOOD (ROUTINE X 2)     Status: Normal (Preliminary result)   Collection Time   08/21/11 10:45 PM      Component Value Range Status Comment   Specimen Description BLOOD LEFT ARM   Final    Special Requests BOTTLES DRAWN AEROBIC AND ANAEROBIC [facility] EACH   Final    Culture  Setup Time [id number]   Final    Culture     Final    Value:        BLOOD CULTURE RECEIVED NO GROWTH TO DATE CULTURE WILL BE HELD FOR 5 DAYS BEFORE ISSUING A FINAL NEGATIVE REPORT   Report Status PENDING   Incomplete   URINE CULTURE     Status: Normal   Collection Time   08/22/11  2:25 AM      Component Value Range Status Comment   Specimen Description URINE, RANDOM   Final    Special Requests NONE   Final    Culture  Setup Time [id number]   Final    Colony Count NO GROWTH   Final    Culture NO GROWTH   Final    Report Status 08/23/2011 FINAL   Final   WOUND CULTURE     Status: Normal (Preliminary result)   Collection Time   08/22/11  4:30 AM      Component Value Range Status Comment   Specimen Description WOUND LEFT FOOT   Final    Special Requests Normal   Final    Gram Stain     Final    Value: RARE WBC PRESENT,BOTH PMN AND MONONUCLEAR     NO SQUAMOUS EPITHELIAL CELLS SEEN     FEW GRAM POSITIVE RODS     FEW GRAM NEGATIVE RODS     RARE GRAM POSITIVE COCCI IN PAIRS   Culture Culture reincubated for better growth   Final    Report Status PENDING   Incomplete    Studies/Results: X-ray Chest Pa And Lateral   08/22/2011  *RADIOLOGY REPORT*  Clinical Data: Fever  CHEST - 2 VIEW  Comparison: 06/13/2010  Findings: Right-sided PICC line has been removed.  Heart size normal.  The lungs are clear.  No pleural effusion.  No acute osseous finding.  IMPRESSION: No acute cardiopulmonary process.  Original Report Authenticated By: Harrel Lemon, M.D.   Dg Foot Complete Left  08/21/2011  *RADIOLOGY REPORT*  Clinical Data:  Diabetes.  Infection in all remaining toes.  LEFT FOOT - COMPLETE 3+ VIEW  Comparison: Left foot 08/18/2011 [facility]  Findings: Postoperative changes of amputation of the left first toe at the level of the distal metatarsal bone.  As shown on the previous study, there is again evidence of focal erosion in the distal aspect of the first metatarsal bone, and in the second and third metatarsal heads.  Soft tissue swelling and soft tissue gas is suggested.  Changes suggest soft tissue infection with early osteomyelitis.  Callus formation and periosteal reaction along the distal aspect of the first through third metatarsal bones.  No evidence of acute fracture.  IMPRESSION: Prior amputation of the left first toe.  Bone erosions, sclerosis, and periosteal reaction in the distal left first through third metatarsal bones suggest early osteomyelitis.  Soft tissue swelling and soft tissue gas consistent with cellulitis.  Stable appearance since previous study.  Original Report Authenticated By: Marlon Pel, M.D.   Medications: I have reviewed the patient's current medications. Scheduled Meds:    . gabapentin  600 mg Oral BID  . heparin  5,000 Units Subcutaneous Q8H  . lisinopril  40 mg Oral Daily   And  . hydrochlorothiazide  25 mg Oral Daily  . insulin aspart  0-9 Units Subcutaneous TID WC  . insulin aspart protamine-insulin aspart  35 Units Subcutaneous BID WC  . naproxen  375 mg Oral BID WC  . nicotine  21 mg Transdermal Daily  . piperacillin-tazobactam (ZOSYN)  IV  3.375 g Intravenous Q8H  . 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 Dr August Saucer from orthopedic surgery will eval MRI to determine  extent of LLE osteo. Likely will get BKA.  Optimally bone cultures will be taken at time of surgery, so that we can narrow therapy accordingly. Will optimize glucose control to assist in wound healing. -- Continue vancomycin and Zosyn for polymicrobial wound infection --  Optimize glucose control -- Will give pain control with OxyIR and naproxen. We'll need to watch renal function  Active Problems: 2) DM (diabetes mellitus), secondary, uncontrolled, with peripheral vascular complications - current A1c of greater than 14 shows very poor control do to patient being unable to afford medications and has not placing a priority on treating his diabetes. Will see how he does on insulin 70/30 35 units twice a day with meals. The patient appears to be relatively sensitive to insulin so we will titrate slowly. Urine microalbumin pending. -- Insulin 70/30 35 units twice daily -- Sliding scale insulin sensitive -- Will have CDE see patient while he is in-house   3) HYPERTENSION - blood pressures since admission have been between the 130s and 150s over 70s to 90s systolic. Likely reflects patient's pain, fluid boluses, and acutely infected condition. We will continue to monitor his blood pressure and titrate up antihypertensives as necessary. -- Continue home regimen  4) Chronic kidney disease, stage 2, mildly decreased GFR - at baseline renal function with creatinine of 1.2. Etiology is likely secondary to diabetes and hypertension. Will monitor creatinine closely given that the patient is on vancomycin and naproxen and ACE inhibitor. Creatinine stable today.  Urine microalb creat ratio almost 90. Patient already on ACEi.  Will DC naproxen if necessary.  5) Inadequate material resources - patient's finances combined with his laxidaisical approach to  treating his diabetes is the source of his current woes. Will obtain social work consult to assist patient in his medical care. States he is 2 weeks away from  getting MAP assistance. WIll plan on switch to long acting + meal coverage at that time.   6) Anemia - likely secondary to anemia of chronic disease per anemia panel drawn March 2012. Will continue following with CBC.   7) DIABETIC PERIPHERAL NEUROPATHY - stable, but gabapentin restarted -- Gabapentin 600 mg twice a day  8) TOBACCO ABUSE - stable continue nicotine patch  9) DVT prophylaxis - 3 times a day heparin  Disposition - pending surgical removal of infected tissue.     LOS: 2 days   Rahm Minix 08/23/2011, 8:47 AM

## 2011-08-23 NOTE — Progress Notes (Signed)
Pt. clammy and shaking, CBG 86,food given. Will re-check.

## 2011-08-24 ENCOUNTER — Encounter (HOSPITAL_COMMUNITY): Payer: Self-pay | Admitting: *Deleted

## 2011-08-24 ENCOUNTER — Encounter (HOSPITAL_COMMUNITY)
Admission: EM | Disposition: A | Discharge status: Home / Self Care | Payer: Self-pay | Source: Home / Self Care | Attending: Internal Medicine

## 2011-08-24 ENCOUNTER — Encounter: Payer: Self-pay | Admitting: Licensed Clinical Social Worker

## 2011-08-24 ENCOUNTER — Inpatient Hospital Stay (HOSPITAL_COMMUNITY): Payer: Medicaid Other | Admitting: *Deleted

## 2011-08-24 HISTORY — PX: AMPUTATION: SHX166

## 2011-08-24 HISTORY — PX: I & D EXTREMITY: SHX5045

## 2011-08-24 LAB — WOUND CULTURE: Special Requests: NORMAL

## 2011-08-24 LAB — DIFFERENTIAL
Basophils Relative: 1 % (ref 0–1)
Eosinophils Absolute: 0.4 10*3/uL (ref 0.0–0.7)
Eosinophils Relative: 3 % (ref 0–5)
Lymphocytes Relative: 19 % (ref 12–46)
Neutrophils Relative %: 71 % (ref 43–77)

## 2011-08-24 LAB — CBC
Hemoglobin: 11 g/dL — ABNORMAL LOW (ref 13.0–17.0)
RBC: 3.7 MIL/uL — ABNORMAL LOW (ref 4.22–5.81)

## 2011-08-24 LAB — BASIC METABOLIC PANEL
CO2: 24 mEq/L (ref 19–32)
GFR calc non Af Amer: 69 mL/min — ABNORMAL LOW (ref >90)
Glucose, Bld: 320 mg/dL — ABNORMAL HIGH (ref 70–99)
Potassium: 4.4 mEq/L (ref 3.5–5.1)
Sodium: 135 mEq/L (ref 135–145)

## 2011-08-24 LAB — GLUCOSE, CAPILLARY: Glucose-Capillary: 138 mg/dL — ABNORMAL HIGH (ref 70–99)

## 2011-08-24 SURGERY — IRRIGATION AND DEBRIDEMENT EXTREMITY
Anesthesia: General | Site: Foot | Laterality: Left | Wound class: Dirty or Infected

## 2011-08-24 MED ORDER — PROPOFOL 10 MG/ML IV EMUL
INTRAVENOUS | Status: DC | PRN
  Administered 2011-08-24: 200 mg via INTRAVENOUS

## 2011-08-24 MED ORDER — METOCLOPRAMIDE HCL 5 MG/ML IJ SOLN
5.0000 mg | Freq: Three times a day (TID) | INTRAMUSCULAR | Status: DC | PRN
  Filled 2011-08-24: qty 2

## 2011-08-24 MED ORDER — ONDANSETRON HCL 4 MG/2ML IJ SOLN: 4.0000 mg | Freq: Once | INTRAMUSCULAR | Status: DC | PRN

## 2011-08-24 MED ORDER — MIDAZOLAM HCL 5 MG/5ML IJ SOLN
INTRAMUSCULAR | Status: DC | PRN
  Administered 2011-08-24: 2 mg via INTRAVENOUS

## 2011-08-24 MED ORDER — ONDANSETRON HCL 4 MG/2ML IJ SOLN
INTRAMUSCULAR | Status: DC | PRN
  Administered 2011-08-24: 4 mg via INTRAVENOUS

## 2011-08-24 MED ORDER — PHENYLEPHRINE HCL 10 MG/ML IJ SOLN
INTRAMUSCULAR | Status: DC | PRN
  Administered 2011-08-24: 80 ug via INTRAVENOUS
  Administered 2011-08-24: 40 ug via INTRAVENOUS
  Administered 2011-08-24: 80 ug via INTRAVENOUS
  Administered 2011-08-24: 100 ug via INTRAVENOUS
  Administered 2011-08-24: 80 ug via INTRAVENOUS
  Administered 2011-08-24: 120 ug via INTRAVENOUS
  Administered 2011-08-24 (×2): 100 ug via INTRAVENOUS

## 2011-08-24 MED ORDER — ONDANSETRON HCL 4 MG PO TABS: 4.0000 mg | ORAL_TABLET | Freq: Four times a day (QID) | ORAL | Status: DC | PRN

## 2011-08-24 MED ORDER — SODIUM CHLORIDE 0.9 % IR SOLN
Status: DC | PRN
  Administered 2011-08-24: 1000 mL

## 2011-08-24 MED ORDER — ONDANSETRON HCL 4 MG/2ML IJ SOLN: 4.0000 mg | Freq: Four times a day (QID) | INTRAMUSCULAR | Status: DC | PRN

## 2011-08-24 MED ORDER — METOCLOPRAMIDE HCL 10 MG PO TABS: 5.0000 mg | ORAL_TABLET | Freq: Three times a day (TID) | ORAL | Status: DC | PRN

## 2011-08-24 MED ORDER — LACTATED RINGERS IV SOLN
INTRAVENOUS | Status: DC | PRN
  Administered 2011-08-24 (×2): via INTRAVENOUS

## 2011-08-24 MED ORDER — DEXTROSE-NACL 5-0.45 % IV SOLN
INTRAVENOUS | Status: DC
  Administered 2011-08-24: 08:00:00 via INTRAVENOUS

## 2011-08-24 MED ORDER — LIDOCAINE HCL (CARDIAC) 20 MG/ML IV SOLN
INTRAVENOUS | Status: DC | PRN
  Administered 2011-08-24: 100 mg via INTRAVENOUS

## 2011-08-24 MED ORDER — MORPHINE SULFATE 4 MG/ML IJ SOLN: 0.0500 mg/kg | INTRAMUSCULAR | Status: DC | PRN

## 2011-08-24 MED ORDER — HEPARIN SODIUM (PORCINE) 5000 UNIT/ML IJ SOLN
5000.0000 [IU] | Freq: Three times a day (TID) | INTRAMUSCULAR | Status: DC
  Administered 2011-08-25 – 2011-08-26 (×5): 5000 [IU] via SUBCUTANEOUS
  Filled 2011-08-24 (×7): qty 1

## 2011-08-24 MED ORDER — FENTANYL CITRATE 0.05 MG/ML IJ SOLN
INTRAMUSCULAR | Status: DC | PRN
  Administered 2011-08-24: 100 ug via INTRAVENOUS
  Administered 2011-08-24: 25 ug via INTRAVENOUS

## 2011-08-24 MED ORDER — HYDROMORPHONE HCL PF 1 MG/ML IJ SOLN: 0.2500 mg | INTRAMUSCULAR | Status: DC | PRN

## 2011-08-24 MED ORDER — CHLORHEXIDINE GLUCONATE 4 % EX LIQD: 60.0000 mL | Freq: Once | CUTANEOUS | Status: DC

## 2011-08-24 SURGICAL SUPPLY — 59 items
BANDAGE ELASTIC 4 VELCRO ST LF (GAUZE/BANDAGES/DRESSINGS) IMPLANT
BANDAGE GAUZE ELAST BULKY 4 IN (GAUZE/BANDAGES/DRESSINGS) ×1 IMPLANT
BLADE AVERAGE 25X9 (BLADE) ×1 IMPLANT
BLADE SURG 10 STRL SS (BLADE) IMPLANT
BNDG CMPR 9X4 STRL LF SNTH (GAUZE/BANDAGES/DRESSINGS) ×1
BNDG COHESIVE 4X5 TAN STRL (GAUZE/BANDAGES/DRESSINGS) ×2 IMPLANT
BNDG ESMARK 4X9 LF (GAUZE/BANDAGES/DRESSINGS) ×2 IMPLANT
CLOTH BEACON ORANGE TIMEOUT ST (SAFETY) ×2 IMPLANT
COVER MAYO STAND STRL (DRAPES) ×1 IMPLANT
COVER SURGICAL LIGHT HANDLE (MISCELLANEOUS) ×2 IMPLANT
CUFF TOURNIQUET SINGLE 18IN (TOURNIQUET CUFF) ×1 IMPLANT
CUFF TOURNIQUET SINGLE 24IN (TOURNIQUET CUFF) IMPLANT
CUFF TOURNIQUET SINGLE 34IN LL (TOURNIQUET CUFF) IMPLANT
CUFF TOURNIQUET SINGLE 44IN (TOURNIQUET CUFF) IMPLANT
DRAPE SURG 17X23 STRL (DRAPES) ×1 IMPLANT
DRAPE U-SHAPE 47X51 STRL (DRAPES) ×2 IMPLANT
DRSG ADAPTIC 3X8 NADH LF (GAUZE/BANDAGES/DRESSINGS) ×2 IMPLANT
DRSG PAD ABDOMINAL 8X10 ST (GAUZE/BANDAGES/DRESSINGS) ×1 IMPLANT
DURAPREP 26ML APPLICATOR (WOUND CARE) ×1 IMPLANT
ELECT REM PT RETURN 9FT ADLT (ELECTROSURGICAL) ×2
ELECTRODE REM PT RTRN 9FT ADLT (ELECTROSURGICAL) ×1 IMPLANT
FACESHIELD LNG OPTICON STERILE (SAFETY) ×2 IMPLANT
GAUZE SPONGE 4X4 16PLY XRAY LF (GAUZE/BANDAGES/DRESSINGS) ×1 IMPLANT
GAUZE XEROFORM 5X9 LF (GAUZE/BANDAGES/DRESSINGS) ×1 IMPLANT
GLOVE BIOGEL PI IND STRL 8 (GLOVE) ×1 IMPLANT
GLOVE BIOGEL PI INDICATOR 8 (GLOVE) ×1
GLOVE SURG ORTHO 8.0 STRL STRW (GLOVE) ×2 IMPLANT
GOWN PREVENTION PLUS LG XLONG (DISPOSABLE) IMPLANT
GOWN PREVENTION PLUS XLARGE (GOWN DISPOSABLE) ×1 IMPLANT
GOWN STRL NON-REIN LRG LVL3 (GOWN DISPOSABLE) ×5 IMPLANT
HANDPIECE INTERPULSE COAX TIP (DISPOSABLE)
KIT BASIN OR (CUSTOM PROCEDURE TRAY) ×2 IMPLANT
KIT ROOM TURNOVER OR (KITS) ×2 IMPLANT
MANIFOLD NEPTUNE II (INSTRUMENTS) ×2 IMPLANT
NS IRRIG 1000ML POUR BTL (IV SOLUTION) ×2 IMPLANT
PACK ORTHO EXTREMITY (CUSTOM PROCEDURE TRAY) ×2 IMPLANT
PAD ARMBOARD 7.5X6 YLW CONV (MISCELLANEOUS) ×4 IMPLANT
PAD CAST 4YDX4 CTTN HI CHSV (CAST SUPPLIES) ×1 IMPLANT
PADDING CAST COTTON 4X4 STRL (CAST SUPPLIES)
SET HNDPC FAN SPRY TIP SCT (DISPOSABLE) IMPLANT
SPONGE GAUZE 4X4 12PLY (GAUZE/BANDAGES/DRESSINGS) ×1 IMPLANT
SPONGE LAP 18X18 X RAY DECT (DISPOSABLE) ×4 IMPLANT
SPONGE LAP 4X18 X RAY DECT (DISPOSABLE) ×1 IMPLANT
STOCKINETTE IMPERVIOUS 9X36 MD (GAUZE/BANDAGES/DRESSINGS) ×1 IMPLANT
SUCTION FRAZIER TIP 10 FR DISP (SUCTIONS) ×1 IMPLANT
SUT ETHILON 2 0 FS 18 (SUTURE) ×2 IMPLANT
SUT ETHILON 3 0 PS 1 (SUTURE) ×3 IMPLANT
SUT ETHILON 4 0 PS 2 18 (SUTURE) IMPLANT
SUT PROLENE 3 0 PS 2 (SUTURE) IMPLANT
SUT VIC AB 3-0 SH 27 (SUTURE)
SUT VIC AB 3-0 SH 27X BRD (SUTURE) IMPLANT
SYR CONTROL 10ML LL (SYRINGE) IMPLANT
TOWEL OR 17X24 6PK STRL BLUE (TOWEL DISPOSABLE) ×2 IMPLANT
TOWEL OR 17X26 10 PK STRL BLUE (TOWEL DISPOSABLE) ×2 IMPLANT
TUBE ANAEROBIC SPECIMEN COL (MISCELLANEOUS) IMPLANT
TUBE CONNECTING 12X1/4 (SUCTIONS) ×2 IMPLANT
UNDERPAD 30X30 INCONTINENT (UNDERPADS AND DIAPERS) ×2 IMPLANT
WATER STERILE IRR 1000ML POUR (IV SOLUTION) ×2 IMPLANT
YANKAUER SUCT BULB TIP NO VENT (SUCTIONS) ×2 IMPLANT

## 2011-08-24 NOTE — Anesthesia Preprocedure Evaluation (Signed)
Anesthesia Evaluation  Patient identified by MRN, date of birth, ID band Patient awake    Reviewed: Allergy & Precautions, H&P , NPO status , Patient's Chart, lab work & pertinent test results  Airway Mallampati: I TM Distance: >3 FB Neck ROM: Full    Dental   Pulmonary          Cardiovascular hypertension, Pt. on medications     Neuro/Psych    GI/Hepatic   Endo/Other  Diabetes mellitus-, Poorly Controlled, Type 1, Insulin Dependent  Renal/GU      Musculoskeletal   Abdominal   Peds  Hematology   Anesthesia Other Findings   Reproductive/Obstetrics                           Anesthesia Physical Anesthesia Plan  ASA: III  Anesthesia Plan: General   Post-op Pain Management:    Induction: Intravenous  Airway Management Planned: Oral ETT  Additional Equipment:   Intra-op Plan:   Post-operative Plan: Extubation in OR  Informed Consent: I have reviewed the patients History and Physical, chart, labs and discussed the procedure including the risks, benefits and alternatives for the proposed anesthesia with the patient or authorized representative who has indicated his/her understanding and acceptance.     Plan Discussed with: CRNA and Surgeon  Anesthesia Plan Comments:         Anesthesia Quick Evaluation

## 2011-08-24 NOTE — Anesthesia Postprocedure Evaluation (Signed)
  Anesthesia Post-op Note  Patient: [name]  Procedure(s) Performed: Procedure(s) (LRB): IRRIGATION AND DEBRIDEMENT EXTREMITY (Left) AMPUTATION RAY (Left)  Patient Location: PACU  Anesthesia Type: General  Level of Consciousness: awake  Airway and Oxygen Therapy: Patient Spontanous Breathing  Post-op Pain: mild  Post-op Assessment: Post-op Vital signs reviewed, Patient's Cardiovascular Status Stable, Respiratory Function Stable, Patent Airway, No signs of Nausea or vomiting and Pain level controlled  Post-op Vital Signs: stable  Complications: No apparent anesthesia complications

## 2011-08-24 NOTE — Preoperative (Signed)
Beta Blockers   Reason not to administer Beta Blockers:Not Applicable 

## 2011-08-24 NOTE — Brief Op Note (Signed)
08/21/2011 - 08/24/2011  7:19 PM  PATIENT:  [name]  46 y.o. male  PRE-OPERATIVE DIAGNOSIS:  osteomyelitis left foot  POST-OPERATIVE DIAGNOSIS:  osteomyelitis left foot  PROCEDURE:  Procedure(s): IRRIGATION AND DEBRIDEMENT EXTREMITY AMPUTATION RAY num 4  SURGEON:  Surgeon(s): Cammy Copa, MD  ASSISTANT: none  ANESTHESIA:   general  EBL: 25 ml       BLOOD ADMINISTERED: none  DRAINS: none   LOCAL MEDICATIONS USED:  none  SPECIMEN:  mt to path  COUNTS:  YES  TOURNIQUET:  * No tourniquets in log *  DICTATION: .Other Dictation: Dictation Number [id number]  PLAN OF CARE: Admit to inpatient   PATIENT DISPOSITION:  PACU - hemodynamically stable

## 2011-08-24 NOTE — Transfer of Care (Signed)
Immediate Anesthesia Transfer of Care Note  Patient: [name]  Procedure(s) Performed: Procedure(s) (LRB): IRRIGATION AND DEBRIDEMENT EXTREMITY (Left) AMPUTATION RAY (Left)  Patient Location: PACU  Anesthesia Type: General  Level of Consciousness: awake, oriented and patient cooperative  Airway & Oxygen Therapy: Patient Spontanous Breathing and Patient connected to nasal cannula oxygen  Post-op Assessment: Report given to PACU RN, Post -op Vital signs reviewed and stable and Patient moving all extremities X 4  Post vital signs: Reviewed and stable  Complications: No apparent anesthesia complications

## 2011-08-24 NOTE — Progress Notes (Signed)
INTERNAL MEDICINE DAILY PROGRESS NOTE  [name] MRN: [id number] DOB/AGE: 12-21-1965 46 y.o.  Admit date: 08/21/2011  Subjective: [name] states he has been feeling febrile. Pain is well controlled on current regimen.  Surgery is not scheduled, the patient is going to be fit in as an add-on. Dr. August Saucer will decide intraoperative if BKA is necessary.    Denies headache, nausea, vomiting, chest pain, shortness of breath, fever, chills, abdominal pain, swelling or other complaint.  Objective: Vital signs in last 24 hours: Filed Vitals:   08/23/11 0539 08/23/11 1409 08/23/11 2214 08/24/11 0634  BP: 138/73 146/86 174/102 141/93  Pulse: 90 93 93 82  Temp: 98.2 F (36.8 C) 97.7 F (36.5 C) 97.5 F (36.4 C) 97.8 F (36.6 C)  TempSrc: Oral  Oral Oral  Resp: 18 20 22 20   Height:      Weight:      SpO2: 100% 100% 99% 100%   Weight change:   Intake/Output Summary (Last 24 hours) at 08/24/11 1142 Last data filed at 08/24/11 4098  Gross per 24 hour  Intake    240 ml  Output   2100 ml  Net  -1860 ml   Physical Exam: General: resting in bed Cardiac: RRR, no rubs, murmurs or gallops Pulm: clear to auscultation bilaterally, moving normal volumes of air Abd: soft, nontender, nondistended, BS present Ext: warm and well perfused, no pedal edema.  Right great toe amputation.  Left lower extremity bandage shows draining serous/purulent fluid. Otherwise bandages intact Neuro: alert and oriented X3, cranial nerves II-XII grossly intact  Lab Results: Basic Metabolic Panel:  Lab 08/24/11 1191 08/23/11 0540  NA 135 132*  K 4.4 4.3  CL 97 99  CO2 24 24  GLUCOSE 320* 256*  BUN 11 11  CREATININE 1.24 1.23  CALCIUM 9.4 8.6  MG -- --  PHOS -- --   Liver Function Tests:  Lab 08/22/11 0922  AST 13  ALT 10  ALKPHOS 112  BILITOT 0.5  PROT 7.1  ALBUMIN 2.5*   CBC:  Lab 08/24/11 0645 08/23/11 0540  WBC 13.7* 13.2*  NEUTROABS 9.8* 9.6*  HGB 11.0* 9.3*  HCT 33.1* 28.7*  MCV  89.5 88.6  PLT 576* 464*   CBG:  Lab 08/24/11 0807 08/23/11 2211 08/23/11 2117 08/23/11 1846 08/23/11 1713 08/23/11 1209  GLUCAP 291* 118* 86 320* 272* 123*   Hemoglobin A1C:  Lab 08/22/11 0006  HGBA1C 14.2*   Fasting Lipid Panel: Lab Results  Component Value Date   CHOL 177 03/28/2011   HDL 61 03/28/2011   LDLCALC 100* 03/28/2011   TRIG 80 03/28/2011   CHOLHDL 2.9 03/28/2011   Urine Drug Screen: Drugs of Abuse     Component Value Date/Time   LABOPIA NONE DETECTED 08/22/2011 0225   COCAINSCRNUR NONE DETECTED 08/22/2011 0225   COCAINSCRNUR NEG 09/22/2008 2125   LABBENZ NONE DETECTED 08/22/2011 0225   LABBENZ NEG 09/22/2008 2125   AMPHETMU NONE DETECTED 08/22/2011 0225   AMPHETMU NEG 09/22/2008 2125   THCU POSITIVE* 08/22/2011 0225   LABBARB NONE DETECTED 08/22/2011 0225    Urinalysis:  Lab 08/22/11 0225  COLORURINE YELLOW  LABSPEC 1.025  PHURINE 5.5  GLUCOSEU 250*  HGBUR NEGATIVE  BILIRUBINUR SMALL*  KETONESUR 15*  PROTEINUR 100*  UROBILINOGEN 1.0  NITRITE NEGATIVE  LEUKOCYTESUR NEGATIVE   Results for AYDEEN, BLUME (MRN [id number]) as of 08/23/2011 08:54  Ref. Range 08/22/2011 02:25  Microalb, Ur Latest Range: 0.00-1.89 mg/dL 30.86 (H)  Microalb  Creat Ratio Latest Range: 0.0-30.0 mg/g 86.2 (H)  Creatinine, Urine No range found 259.0    Micro Results: Recent Results (from the past 240 hour(s))  CULTURE, BLOOD (ROUTINE X 2)     Status: Normal (Preliminary result)   Collection Time   08/21/11 10:40 PM      Component Value Range Status Comment   Specimen Description BLOOD RIGHT ARM   Final    Special Requests BOTTLES DRAWN AEROBIC AND ANAEROBIC 5CC EACH   Final    Culture  Setup Time [id number]   Final    Culture     Final    Value:        BLOOD CULTURE RECEIVED NO GROWTH TO DATE CULTURE WILL BE HELD FOR 5 DAYS BEFORE ISSUING A FINAL NEGATIVE REPORT   Report Status PENDING   Incomplete   CULTURE, BLOOD (ROUTINE X 2)     Status: Normal (Preliminary result)    Collection Time   08/21/11 10:45 PM      Component Value Range Status Comment   Specimen Description BLOOD LEFT ARM   Final    Special Requests BOTTLES DRAWN AEROBIC AND ANAEROBIC [facility] EACH   Final    Culture  Setup Time [id number]   Final    Culture     Final    Value:        BLOOD CULTURE RECEIVED NO GROWTH TO DATE CULTURE WILL BE HELD FOR 5 DAYS BEFORE ISSUING A FINAL NEGATIVE REPORT   Report Status PENDING   Incomplete   URINE CULTURE     Status: Normal   Collection Time   08/22/11  2:25 AM      Component Value Range Status Comment   Specimen Description URINE, RANDOM   Final    Special Requests NONE   Final    Culture  Setup Time [id number]   Final    Colony Count NO GROWTH   Final    Culture NO GROWTH   Final    Report Status 08/23/2011 FINAL   Final   WOUND CULTURE     Status: Normal (Preliminary result)   Collection Time   08/22/11  4:30 AM      Component Value Range Status Comment   Specimen Description WOUND LEFT FOOT   Final    Special Requests Normal   Final    Gram Stain     Final    Value: RARE WBC PRESENT,BOTH PMN AND MONONUCLEAR     NO SQUAMOUS EPITHELIAL CELLS SEEN     FEW GRAM POSITIVE RODS     FEW GRAM NEGATIVE RODS     RARE GRAM POSITIVE COCCI IN PAIRS   Culture Culture reincubated for better growth   Final    Report Status PENDING   Incomplete    Studies/Results: Mr Foot Left W Wo Contrast  08/24/2011  *RADIOLOGY REPORT*  Clinical Data: Foot osteomyelitis.  Left foot pain.  History of debridement of callus.  Fever, sweats, nausea and vomiting. Diabetes.  Multiple amputations.  The  MRI OF THE LEFT FOREFOOT WITHOUT AND WITH CONTRAST  Technique:  Multiplanar, multisequence MR imaging was performed both before and after administration of intravenous contrast.  Contrast: 20mL MULTIHANCE GADOBENATE DIMEGLUMINE 529 MG/ML IV SOLN radiographs 08/21/2011.  Comparison: None.  Findings: There is osteomyelitis of the fourth toe.  This extends from the middle phalanx to  the metatarsal neck.  The fourth MTP joint appears septic.  There is a dorsal soft tissue abscess overlying the fourth  MTP joint that measures 1 cm AP, 14 mm transverse and at least 13 mm in the long axis of the foot. Reactive inflammatory changes are present around the fourth MTP joint.  Hypertrophic cortex is present in the second third metatarsal shaft.  Amputation of the phalanges of the great toe and distal first metatarsal.  There is an abnormal marrow signal in the distal first metatarsal however this does not appear represent osteomyelitis.  There is no radiating bone marrow edema.  The muscular signal demonstrates infectious myositis and cellulitis surrounds the foot circumferentially, more prominent dorsally.  IMPRESSION: 1.  Septic arthritis of the fourth MTP joint with surrounding phlegmon.  Osteomyelitis of the proximal phalanx and fourth metatarsal head.  Radiating bone marrow edema extends to the proximal fourth metatarsal shaft. 2.  Reactive inflammatory changes of the third and fifth toes without evidence of osteomyelitis. 3.  Small approximate 1.5 cm soft tissue abscess dorsal to the first MTP joint.  Original Report Authenticated By: Andreas Newport, M.D.   Medications: I have reviewed the patient's current medications. Scheduled Meds:    . gabapentin  600 mg Oral BID  . gadobenate dimeglumine  20 mL Intravenous Once  . lisinopril  40 mg Oral Daily   And  . hydrochlorothiazide  25 mg Oral Daily  . insulin aspart  0-9 Units Subcutaneous TID WC  . insulin aspart protamine-insulin aspart  35 Units Subcutaneous BID WC  . naproxen  375 mg Oral BID WC  . nicotine  21 mg Transdermal Daily  . piperacillin-tazobactam (ZOSYN)  IV  3.375 g Intravenous Q8H  . vancomycin  1,250 mg Intravenous Q12H   Continuous Infusions:    . dextrose 5 % and 0.45% NaCl 50 mL/hr at 08/24/11 0816   PRN Meds:.ondansetron (ZOFRAN) IV, ondansetron, oxyCODONE, oxyCODONE, zolpidem Assessment/Plan: Principal  Problem: 1) Diabetic foot ulcer with probable Osteomyelitis of foot, acute, left -Dr August Saucer from orthopedic surgery try to fit into OR schedule today. He will decide intra-op extent of LLE osteo and whether will need BKA.  Optimally bone cultures will be taken at time of surgery, so that we can narrow therapy accordingly. Will optimize glucose control to assist in wound healing. -- Continue vancomycin and Zosyn for polymicrobial wound infection --  Optimize glucose control -- Will give pain control with OxyIR and naproxen. We'll need to watch renal function  Active Problems: 2) DM (diabetes mellitus), secondary, uncontrolled, with peripheral vascular complications - current A1c of greater than 14 shows very poor control do to patient being unable to afford medications and has not placing a priority on treating his diabetes. Will see how he does on insulin 70/30 35 units twice a day with meals. The patient appears to be relatively sensitive to insulin so we will titrate slowly. -- Insulin 70/30 35 units twice daily -- Sliding scale insulin sensitive  3) HYPERTENSION - blood pressures since admission have been between the 130s and 150s over 70s to 90s systolic. Likely reflects patient's pain, fluid boluses, and acutely infected condition. Blood pressures (especially diastolic) have still been elevated despite adequate pain control. Patient will likely require additional antihypertensive control. We'll plan to adjust therapy after surgery. -- Continue home regimen  4) Chronic kidney disease, stage 2, mildly decreased GFR - at baseline renal function with creatinine of 1.2. Etiology is likely secondary to diabetes and hypertension. Will monitor creatinine closely given that the patient is on vancomycin and naproxen and ACE inhibitor. Creatinine stable today.  Urine microalb creat ratio  almost 90. Patient already on ACEi.  Will DC naproxen if necessary.  5) Inadequate material resources - patient's finances  combined with his laxidaisical approach to treating his diabetes is the source of his current woes. Will obtain social work consult to assist patient in his medical care. States he is 2 weeks away from getting MAP assistance. WIll plan on switch to long acting + meal coverage at that time.   6) Anemia - likely secondary to anemia of chronic disease per anemia panel drawn March 2012. Will continue following with CBC post surgery.   7) DIABETIC PERIPHERAL NEUROPATHY - stable, but gabapentin restarted -- Gabapentin 600 mg twice a day  8) TOBACCO ABUSE - stable continue nicotine patch  9) DVT prophylaxis -SCDs prior to surgery. Then will be followed with 3 times a day heparin  Disposition - pending surgical removal of infected tissue.   LOS: 3 days   Graviel Payeur 08/24/2011, 11:42 AM

## 2011-08-24 NOTE — Progress Notes (Signed)
CSW met with pt during his inpatient hospitalization stay.  Pt in good spirits about potential BKA.  Lengthy conversation with [name] regarding his significant other, who he called his "wife" and financial issues.  CSW provided emotional support and encouragement.  Pt aware CSW is available to assist upon discharge.

## 2011-08-24 NOTE — Progress Notes (Signed)
Pt for surgery - mri 1 4 osteo - plan debridement and ray amp 1 4  To try to save foot - may require amp later bka if wounds do not heal - all questions answered

## 2011-08-25 ENCOUNTER — Encounter (HOSPITAL_COMMUNITY): Payer: Self-pay | Admitting: Orthopedic Surgery

## 2011-08-25 LAB — BASIC METABOLIC PANEL
BUN: 10 mg/dL (ref 6–23)
Chloride: 97 mEq/L (ref 96–112)
Creatinine, Ser: 1.39 mg/dL — ABNORMAL HIGH (ref 0.50–1.35)
Glucose, Bld: 347 mg/dL — ABNORMAL HIGH (ref 70–99)
Potassium: 4.9 mEq/L (ref 3.5–5.1)

## 2011-08-25 LAB — CBC
HCT: 30 % — ABNORMAL LOW (ref 39.0–52.0)
Hemoglobin: 9.7 g/dL — ABNORMAL LOW (ref 13.0–17.0)
MCH: 28.9 pg (ref 26.0–34.0)
MCHC: 32.3 g/dL (ref 30.0–36.0)
MCV: 89.3 fL (ref 78.0–100.0)

## 2011-08-25 LAB — VANCOMYCIN, TROUGH: Vancomycin Tr: 23.6 ug/mL — ABNORMAL HIGH (ref 10.0–20.0)

## 2011-08-25 LAB — GLUCOSE, CAPILLARY: Glucose-Capillary: 194 mg/dL — ABNORMAL HIGH (ref 70–99)

## 2011-08-25 MED ORDER — POLYETHYLENE GLYCOL 3350 17 G PO PACK
17.0000 g | PACK | Freq: Every day | ORAL | Status: DC
  Administered 2011-08-25 – 2011-08-26 (×2): 17 g via ORAL
  Filled 2011-08-25 (×2): qty 1

## 2011-08-25 MED ORDER — INSULIN ASPART PROT & ASPART (70-30 MIX) 100 UNIT/ML ~~LOC~~ SUSP
40.0000 [IU] | Freq: Two times a day (BID) | SUBCUTANEOUS | Status: DC
  Administered 2011-08-25 – 2011-08-26 (×2): 40 [IU] via SUBCUTANEOUS

## 2011-08-25 MED ORDER — PANTOPRAZOLE SODIUM 40 MG PO TBEC
40.0000 mg | DELAYED_RELEASE_TABLET | Freq: Every day | ORAL | Status: DC
  Administered 2011-08-25 – 2011-08-26 (×2): 40 mg via ORAL
  Filled 2011-08-25 (×2): qty 1

## 2011-08-25 MED ORDER — VANCOMYCIN HCL IN DEXTROSE 1-5 GM/200ML-% IV SOLN
1000.0000 mg | Freq: Two times a day (BID) | INTRAVENOUS | Status: DC
  Administered 2011-08-25 – 2011-08-26 (×3): 1000 mg via INTRAVENOUS
  Filled 2011-08-25 (×5): qty 200

## 2011-08-25 MED ORDER — DOCUSATE SODIUM 100 MG PO CAPS
100.0000 mg | ORAL_CAPSULE | Freq: Two times a day (BID) | ORAL | Status: DC
  Administered 2011-08-25 – 2011-08-26 (×3): 100 mg via ORAL
  Filled 2011-08-25 (×3): qty 1

## 2011-08-25 NOTE — Op Note (Signed)
NAMEDUGLAS, HEIER NO.:  [id number]  MEDICAL RECORD NO.:  [id number]  LOCATION:  5150                         FACILITY:  [facility]  PHYSICIAN:  Burnard Bunting, M.D.    DATE OF BIRTH:  [birth year]  DATE OF PROCEDURE: DATE OF DISCHARGE:                              OPERATIVE REPORT   PREOPERATIVE DIAGNOSIS:  Left foot infection.  POSTOPERATIVE DIAGNOSIS:  Left foot infection.  PROCEDURES: 1. Left foot I and D of distal first ray. 2. Left fourth ray amputation.  SURGEON:  Burnard Bunting, MD  ASSISTANT:  None.  ANESTHESIA:  General.  INDICATIONS:  [name] is a patient with left foot infection who presents for irrigation and debridement after explanation of risks and benefits.  PROCEDURE IN DETAIL:  The patient was brought to the operating room, where general endotracheal anesthesia was used.  Preoperative antibiotics were administered.  Left leg was prepped with Hibiclens and saline draped in a sterile manner.  Time-out was called.  Ankle Esmarch was utilized for approximately 30 minutes.  Initially, an incision was made over the dorsal first metatarsal.  MRI scan showed infection dorsal to the MTP joint.  MTP joint had been removed with prior resection. This area was incised and explored.  No definite abscess pocket was found at the area of the resected MTP joint.  This incision was then irrigated and closed using 3-0 nylon.  At this time, a longitudinal incision at the fourth metatarsal was made and ellipsed around the toe. The incision was taken down to the bone.  The toe and metatarsal was then excised from the surrounding soft tissue.  This was sent to pathology.  Curette was then used to debride the remaining infected soft tissue surrounding this area back to healthy bleeding tissue. Tourniquet was released at this time.  Irrigation with 2 L of irrigating solution was performed.  Electrocautery was used to control any bleeding points.  Closure was  achieved using a 2-0 nylon to reapproximate and close the dead space.  There was a plantar ulcer which was debrided and left open.  The plan is for wound care of foot to help that closed.  Now the infection source is removed, I think there is a chance that it could close.  A bulky wrap was applied. The patient tolerated the procedure well without immediate complications.  Transferred to the recovery room in stable condition.     Burnard Bunting, M.D.     GSD/MEDQ  D:  08/24/2011  T:  08/25/2011  Job:  [id number]

## 2011-08-25 NOTE — Progress Notes (Signed)
Internal Medicine Attending  Date: 08/25/2011  Patient name: [name] Medical record number: [id number] Date of birth: [birth year] Age: 46 y.o. Gender: male  I saw and evaluated the patient. I reviewed the resident's note by Dr. Candy Sledge and I agree with the resident's findings and plans as documented in his progress note.  [name] underwent an I&D of distal first ray and left fourth ray amputation last night. This morning his pain is well controlled and he is without other complaints. We are working on appropriate footwear upon discharge as well as determining if antibiotics are necessary when [name] is discharge. In the meantime we're continuing to aggressively treat his diabetes and continuing the IV antibiotics.

## 2011-08-25 NOTE — Care Management Note (Signed)
  Page 1 of 1   08/25/2011     4:30:10 PM   CARE MANAGEMENT NOTE 08/25/2011  Patient:  [name], [name]   Account Number:  [id number]  Date Initiated:  08/22/2011  Documentation initiated by:  Ronny Flurry  Subjective/Objective Assessment:   DX:  Osteomyelitis/Cellulitis of Left foot     Action/Plan:   Anticipated DC Date:  08/25/2011   Anticipated DC Plan:  HOME W HOME HEALTH SERVICES         Choice offered to / List presented to:             Status of service:  In process, will continue to follow Medicare Important Message given?   (If response is "NO", the following Medicare IM given date fields will be blank) Date Medicare IM given:   Date Additional Medicare IM given:    Discharge Disposition:    Per UR Regulation:  Reviewed for med. necessity/level of care/duration of stay  If discussed at Long Length of Stay Meetings, dates discussed:    Comments:     08-25-11 Know patient from previous admissions . In past he has received help from [employer] and had an orange card.  Tried to speak with patient regarding status of his orange card, home situation etc. However, patient was on the phone and dismissed me.  Patient has not used ZZ fund from main  pharmacy for over a year , so he is eligible . Ronny Flurry RN BSN   MD admission note : Social Issues: Patient has a history of medication noncompliance and no show for follow ups especially wound care clinic. Our clinic has spent great effort to assist patient financially such as gift card to [employer], free antibiotics for his infection, housing at the extended hotel, bus passes so he can come to his appointments. At some point, patient needs to be responsible for his own health. Will consult SW while he is in patient.  CSW spoke with patient regarding SCAT application. Patient was agreeable to complete application.

## 2011-08-25 NOTE — Progress Notes (Addendum)
Pt refusing SSI this am.  Discussed with pt the risks of refusing his insulin and hyperglycemia.  Pt continues to refuse.  [name] [name]

## 2011-08-25 NOTE — Progress Notes (Signed)
ANTIBIOTIC CONSULT NOTE - FOLLOW UP  Pharmacy Consult for Vancomycin Indication: L-foot Osteomyelitis  No Known Allergies  Patient Measurements: Height: 5\' 8"  (172.7 cm) Weight: 205 lb 11 oz (93.3 kg) IBW/kg (Calculated) : 68.4   Vital Signs: Temp: 98.3 F (36.8 C) (05/17 1005) Temp src: Oral (05/17 1005) BP: 139/96 mmHg (05/17 1005) Pulse Rate: 99  (05/17 1005) Intake/Output from previous day: 05/16 0701 - 05/17 0700 In: 1006.7 [P.O.:120; I.V.:886.7] Out: 1775 [Urine:1775] Intake/Output from this shift: Total I/O In: 600 [P.O.:600] Out: 1200 [Urine:1200]  Labs:  [facility] 08/25/11 0642 08/24/11 0645 08/23/11 0540  WBC 10.0 13.7* 13.2*  HGB 9.7* 11.0* 9.3*  PLT 482* 576* 464*  LABCREA -- -- --  CREATININE 1.39* 1.24 1.23   Estimated Creatinine Clearance: 74.4 ml/min (by C-G formula based on Cr of 1.39).  Basename 08/25/11 1124  VANCOTROUGH 23.6*  VANCOPEAK --  Drue Dun --  GENTTROUGH --  GENTPEAK --  GENTRANDOM --  TOBRATROUGH --  TOBRAPEAK --  TOBRARND --  AMIKACINPEAK --  AMIKACINTROU --  AMIKACIN --     Microbiology: Recent Results (from the past 720 hour(s))  CULTURE, BLOOD (ROUTINE X 2)     Status: Normal (Preliminary result)   Collection Time   08/21/11 10:40 PM      Component Value Range Status Comment   Specimen Description BLOOD RIGHT ARM   Final    Special Requests BOTTLES DRAWN AEROBIC AND ANAEROBIC 5CC EACH   Final    Culture  Setup Time [id number]   Final    Culture     Final    Value:        BLOOD CULTURE RECEIVED NO GROWTH TO DATE CULTURE WILL BE HELD FOR 5 DAYS BEFORE ISSUING A FINAL NEGATIVE REPORT   Report Status PENDING   Incomplete   CULTURE, BLOOD (ROUTINE X 2)     Status: Normal (Preliminary result)   Collection Time   08/21/11 10:45 PM      Component Value Range Status Comment   Specimen Description BLOOD LEFT ARM   Final    Special Requests BOTTLES DRAWN AEROBIC AND ANAEROBIC [facility] EACH   Final    Culture  Setup Time  [id number]   Final    Culture     Final    Value:        BLOOD CULTURE RECEIVED NO GROWTH TO DATE CULTURE WILL BE HELD FOR 5 DAYS BEFORE ISSUING A FINAL NEGATIVE REPORT   Report Status PENDING   Incomplete   URINE CULTURE     Status: Normal   Collection Time   08/22/11  2:25 AM      Component Value Range Status Comment   Specimen Description URINE, RANDOM   Final    Special Requests NONE   Final    Culture  Setup Time [id number]   Final    Colony Count NO GROWTH   Final    Culture NO GROWTH   Final    Report Status 08/23/2011 FINAL   Final   WOUND CULTURE     Status: Normal   Collection Time   08/22/11  4:30 AM      Component Value Range Status Comment   Specimen Description WOUND LEFT FOOT   Final    Special Requests Normal   Final    Gram Stain     Final    Value: RARE WBC PRESENT,BOTH PMN AND MONONUCLEAR     NO SQUAMOUS EPITHELIAL CELLS SEEN  FEW GRAM POSITIVE RODS     FEW GRAM NEGATIVE RODS     RARE GRAM POSITIVE COCCI IN PAIRS   Culture     Final    Value: MODERATE GROUP B STREP(S.AGALACTIAE)ISOLATED     Note: TESTING AGAINST S. AGALACTIAE NOT ROUTINELY PERFORMED DUE TO PREDICTABILITY OF AMP/PEN/VAN SUSCEPTIBILITY.   Report Status 08/24/2011 FINAL   Final   SURGICAL PCR SCREEN     Status: Normal   Collection Time   08/24/11  5:08 PM      Component Value Range Status Comment   MRSA, PCR NEGATIVE  NEGATIVE  Final    Staphylococcus aureus NEGATIVE  NEGATIVE  Final     Anti-infectives     Start     Dose/Rate Route Frequency Ordered Stop   08/22/11 1200   vancomycin (VANCOCIN) 1,250 mg in sodium chloride 0.9 % 250 mL IVPB        1,250 mg 166.7 mL/hr over 90 Minutes Intravenous Every 12 hours 08/22/11 0015     08/22/11 0800  piperacillin-tazobactam (ZOSYN) IVPB 3.375 g       3.375 g 12.5 mL/hr over 240 Minutes Intravenous Every 8 hours 08/22/11 0015     08/22/11 0015   vancomycin (VANCOCIN) 1,250 mg in sodium chloride 0.9 % 250 mL IVPB        1,250 mg 166.7 mL/hr  over 90 Minutes Intravenous NOW 08/22/11 0015 08/22/11 0342   08/22/11 0015  piperacillin-tazobactam (ZOSYN) IVPB 3.375 g       3.375 g 100 mL/hr over 30 Minutes Intravenous NOW 08/22/11 0015 08/22/11 0459   08/21/11 2300   clindamycin (CLEOCIN) IVPB 900 mg  Status:  Discontinued        900 mg 100 mL/hr over 30 Minutes Intravenous To [facility] 08/21/11 2219 08/22/11 0011   08/21/11 2230   ciprofloxacin (CIPRO) IVPB 400 mg        400 mg 200 mL/hr over 60 Minutes Intravenous  Once 08/21/11 2219 08/21/11 2357          Assessment: 45yo male with osteomyelitis, s/p L-foot I&D of distal first ray & L-fourth toe amputation.  Vancomycin trough is elevated on current dose of 1250mg  IV q12. Pt with CRI and Cr is up to 1.39, but this is within his usual baseline range of 1.2-1.5.  Goal of Therapy:  Vancomycin trough level 15-20 mcg/ml  Plan:  1.  Change Vancomycin to 1000mg  IV q12 2.  Watch renal function  Marisue Humble, PharmD Clinical Pharmacist [facility]

## 2011-08-25 NOTE — Progress Notes (Signed)
INTERNAL MEDICINE DAILY PROGRESS NOTE  [name] MRN: [id number] DOB/AGE: [birth year] 45 y.o.  Admit date: 08/21/2011  Subjective: [name] received Left foot I and D of distal first ray and Left fourth ray amputation last night by Dr. August Saucer. Had fevers at 5 AM today, decreased from the night before. Pain well controlled. Has not had a BM in a few days.   Objective: Vital signs in last 24 hours: Filed Vitals:   08/24/11 2210 08/24/11 2331 08/25/11 0126 08/25/11 0547  BP: 140/87 148/95 148/91 139/85  Pulse: 91 91 92 98  Temp: 97.9 F (36.6 C) 97.8 F (36.6 C) 97.3 F (36.3 C) 97.7 F (36.5 C)  TempSrc: Oral  Oral Oral  Resp: 18 18 18 18   Height:      Weight:      SpO2: 100% 100% 100% 99%   Weight change:   Intake/Output Summary (Last 24 hours) at 08/25/11 0844 Last data filed at 08/25/11 0725  Gross per 24 hour  Intake 1006.67 ml  Output   2375 ml  Net -1368.33 ml   Physical Exam: General: resting in bed Cardiac: RRR, no rubs, murmurs or gallops Pulm: clear to auscultation bilaterally, moving normal volumes of air Abd: soft, nontender, nondistended, BS present Ext: warm and well perfused, no pedal edema.  Right foot bandaged c/d/i.  Able to move toes. Distal sensation and circulation intact.  Neuro: alert and oriented X3, cranial nerves II-XII grossly intact  Lab Results: Basic Metabolic Panel  Lab 08/25/11 0642 08/24/11 0645  NA 131* 135  K 4.9 4.4  CL 97 97  CO2 26 24  GLUCOSE 347* 320*  BUN 10 11  CREATININE 1.39* 1.24  CALCIUM 8.7 9.4  MG -- --  PHOS -- --   Liver Function Tests:  Lab 08/22/11 0922  AST 13  ALT 10  ALKPHOS 112  BILITOT 0.5  PROT 7.1  ALBUMIN 2.5*   CBC:  Lab 08/25/11 0642 08/24/11 0645 08/23/11 0540  WBC 10.0 13.7* --  NEUTROABS -- 9.8* 9.6*  HGB 9.7* 11.0* --  HCT 30.0* 33.1* --  MCV 89.3 89.5 --  PLT 482* 576* --   CBG:  Lab 08/25/11 0728 08/24/11 2209 08/24/11 1926 08/24/11 1715 08/24/11 1210 08/24/11 0807    GLUCAP 337* 146* 138* 167* 201* 291*   Hemoglobin A1C:  Lab 08/22/11 0006  HGBA1C 14.2*   Fasting Lipid Panel: Lab Results  Component Value Date   CHOL 177 03/28/2011   HDL 61 03/28/2011   LDLCALC 100* 03/28/2011   TRIG 80 03/28/2011   CHOLHDL 2.9 03/28/2011   Urine Drug Screen: Drugs of Abuse     Component Value Date/Time   LABOPIA NONE DETECTED 08/22/2011 0225   COCAINSCRNUR NONE DETECTED 08/22/2011 0225   COCAINSCRNUR NEG 09/22/2008 2125   LABBENZ NONE DETECTED 08/22/2011 0225   LABBENZ NEG 09/22/2008 2125   AMPHETMU NONE DETECTED 08/22/2011 0225   AMPHETMU NEG 09/22/2008 2125   THCU POSITIVE* 08/22/2011 0225   LABBARB NONE DETECTED 08/22/2011 0225    Urinalysis:  Lab 08/22/11 0225  COLORURINE YELLOW  LABSPEC 1.025  PHURINE 5.5  GLUCOSEU 250*  HGBUR NEGATIVE  BILIRUBINUR SMALL*  KETONESUR 15*  PROTEINUR 100*  UROBILINOGEN 1.0  NITRITE NEGATIVE  LEUKOCYTESUR NEGATIVE   Results for [name] (MRN [id number]) as of 08/23/2011 08:54  Ref. Range 08/22/2011 02:25  Microalb, Ur Latest Range: 0.00-1.89 mg/dL 21.30 (H)  Microalb Creat Ratio Latest Range: 0.0-30.0 mg/g 86.2 (H)  Creatinine,  Urine No range found 259.0    Micro Results: Recent Results (from the past 240 hour(s))  CULTURE, BLOOD (ROUTINE X 2)     Status: Normal (Preliminary result)   Collection Time   08/21/11 10:40 PM      Component Value Range Status Comment   Specimen Description BLOOD RIGHT ARM   Final    Special Requests BOTTLES DRAWN AEROBIC AND ANAEROBIC 5CC EACH   Final    Culture  Setup Time [id number]   Final    Culture     Final    Value:        BLOOD CULTURE RECEIVED NO GROWTH TO DATE CULTURE WILL BE HELD FOR 5 DAYS BEFORE ISSUING A FINAL NEGATIVE REPORT   Report Status PENDING   Incomplete   CULTURE, BLOOD (ROUTINE X 2)     Status: Normal (Preliminary result)   Collection Time   08/21/11 10:45 PM      Component Value Range Status Comment   Specimen Description BLOOD LEFT ARM   Final     Special Requests BOTTLES DRAWN AEROBIC AND ANAEROBIC [facility] EACH   Final    Culture  Setup Time [id number]   Final    Culture     Final    Value:        BLOOD CULTURE RECEIVED NO GROWTH TO DATE CULTURE WILL BE HELD FOR 5 DAYS BEFORE ISSUING A FINAL NEGATIVE REPORT   Report Status PENDING   Incomplete   URINE CULTURE     Status: Normal   Collection Time   08/22/11  2:25 AM      Component Value Range Status Comment   Specimen Description URINE, RANDOM   Final    Special Requests NONE   Final    Culture  Setup Time [id number]   Final    Colony Count NO GROWTH   Final    Culture NO GROWTH   Final    Report Status 08/23/2011 FINAL   Final   WOUND CULTURE     Status: Normal   Collection Time   08/22/11  4:30 AM      Component Value Range Status Comment   Specimen Description WOUND LEFT FOOT   Final    Special Requests Normal   Final    Gram Stain     Final    Value: RARE WBC PRESENT,BOTH PMN AND MONONUCLEAR     NO SQUAMOUS EPITHELIAL CELLS SEEN     FEW GRAM POSITIVE RODS     FEW GRAM NEGATIVE RODS     RARE GRAM POSITIVE COCCI IN PAIRS   Culture     Final    Value: MODERATE GROUP B STREP(S.AGALACTIAE)ISOLATED     Note: TESTING AGAINST S. AGALACTIAE NOT ROUTINELY PERFORMED DUE TO PREDICTABILITY OF AMP/PEN/VAN SUSCEPTIBILITY.   Report Status 08/24/2011 FINAL   Final   SURGICAL PCR SCREEN     Status: Normal   Collection Time   08/24/11  5:08 PM      Component Value Range Status Comment   MRSA, PCR NEGATIVE  NEGATIVE  Final    Staphylococcus aureus NEGATIVE  NEGATIVE  Final    Studies/Results: Mr Foot Left W Wo Contrast  08/24/2011  *RADIOLOGY REPORT*  Clinical Data: Foot osteomyelitis.  Left foot pain.  History of debridement of callus.  Fever, sweats, nausea and vomiting. Diabetes.  Multiple amputations.  The  MRI OF THE LEFT FOREFOOT WITHOUT AND WITH CONTRAST  Technique:  Multiplanar, multisequence MR imaging was performed both  before and after administration of intravenous contrast.   Contrast: 20mL MULTIHANCE GADOBENATE DIMEGLUMINE 529 MG/ML IV SOLN radiographs 08/21/2011.  Comparison: None.  Findings: There is osteomyelitis of the fourth toe.  This extends from the middle phalanx to the metatarsal neck.  The fourth MTP joint appears septic.  There is a dorsal soft tissue abscess overlying the fourth MTP joint that measures 1 cm AP, 14 mm transverse and at least 13 mm in the long axis of the foot. Reactive inflammatory changes are present around the fourth MTP joint.  Hypertrophic cortex is present in the second third metatarsal shaft.  Amputation of the phalanges of the great toe and distal first metatarsal.  There is an abnormal marrow signal in the distal first metatarsal however this does not appear represent osteomyelitis.  There is no radiating bone marrow edema.  The muscular signal demonstrates infectious myositis and cellulitis surrounds the foot circumferentially, more prominent dorsally.  IMPRESSION: 1.  Septic arthritis of the fourth MTP joint with surrounding phlegmon.  Osteomyelitis of the proximal phalanx and fourth metatarsal head.  Radiating bone marrow edema extends to the proximal fourth metatarsal shaft. 2.  Reactive inflammatory changes of the third and fifth toes without evidence of osteomyelitis. 3.  Small approximate 1.5 cm soft tissue abscess dorsal to the first MTP joint.  Original Report Authenticated By: Andreas Newport, M.D.   Medications: I have reviewed the patient's current medications. Scheduled Meds:    . docusate sodium  100 mg Oral BID  . gabapentin  600 mg Oral BID  . heparin subcutaneous  5,000 Units Subcutaneous Q8H  . lisinopril  40 mg Oral Daily   And  . hydrochlorothiazide  25 mg Oral Daily  . insulin aspart  0-9 Units Subcutaneous TID WC  . insulin aspart protamine-insulin aspart  40 Units Subcutaneous BID WC  . nicotine  21 mg Transdermal Daily  . pantoprazole  40 mg Oral Q1200  . piperacillin-tazobactam (ZOSYN)  IV  3.375 g Intravenous  Q8H  . polyethylene glycol  17 g Oral Daily  . vancomycin  1,250 mg Intravenous Q12H  . DISCONTD: chlorhexidine  60 mL Topical Once  . DISCONTD: insulin aspart protamine-insulin aspart  35 Units Subcutaneous BID WC  . DISCONTD: naproxen  375 mg Oral BID WC   Continuous Infusions:    . 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 Patient is postop day one and feeling very well. Will plan on discharging on doxycycline if Dr. August Saucer agrees.. Will continue to optimize glucose control to assist in wound healing. Patient would like wedge shoes for discharge.  -- Continue vancomycin and Zosyn for polymicrobial wound infection -- Optimize glucose control -- Will give pain control with OxyIR   Active Problems: 2) DM (diabetes mellitus), secondary, uncontrolled, with peripheral vascular complications - current A1c of greater than 14 shows very poor control do to patient being unable to afford medications and has not placing a priority on treating his diabetes. EGDs were not ideally controlled on 35 units of insulin 70/30. The patient appears to be relatively sensitive to insulin  so we will titrate slowly to 40 units twice a day today. -- Insulin 70/30 40 units twice daily -- Sliding scale insulin sensitive  3) HYPERTENSION - blood pressures since admission have been between the 130s and 150s over 70s to 90s systolic. Likely  reflects patient's pain, fluid boluses, and acutely infected condition. Blood pressures (especially diastolic) have still been elevated despite adequate pain control. Patient will likely require additional antihypertensive control. Will start amlodipine 5 mg today - starting amlodipine 5 mg daily - continue lisinopril 40- HCTZ 25   4) Chronic kidney disease, stage 2, mildly decreased GFR - . Etiology is likely secondary to diabetes and hypertension. Urine microalb creat ratio almost 90. Patient already on ACEi. Monitoring creatinine closely given that the patient is on vancomycin and naproxen and ACE inhibitor. Creatinine slightly increased today to 1.4, likely secondary to being n.p.o. overnight with surgery. Will hold naproxen until creatinine are normalizes.     5) Inadequate material resources - patient's finances combined with his laxidaisical approach to treating his diabetes is the source of his current woes. Will obtain social work consult to assist patient in his medical care. States he is 2 weeks away from getting MAP assistance. WIll plan on switch to long acting + meal coverage at that time. Will have our social worker discuss with patient getting disability  6) Anemia - likely secondary to anemia of chronic disease per anemia panel drawn March 2012. Stable at baseline post surgery.   7) DIABETIC PERIPHERAL NEUROPATHY - stable, but gabapentin restarted -- Gabapentin 600 mg twice a day  8) TOBACCO ABUSE - stable continue nicotine patch  9) DVT prophylaxis -3 times a day heparin  Disposition - pending Dr. Diamantina Providence recommendations regarding antibiotics, wedge shoes and  clearance post op.     LOS: 4 days   Sheilla Maris 08/25/2011, 8:44 AM

## 2011-08-25 NOTE — Evaluation (Signed)
Physical Therapy Evaluation Patient Details Name: [name] MRN: [id number] DOB: Aug 03, 1965 Today's Date: 08/25/2011 Time: 1478-2956 PT Time Calculation (min): 36 min  PT Assessment / Plan / Recommendation Clinical Impression  pt s/p L foot 4th ray amputation due to osteo.  Dr August Saucer wants pt to be conservative on his foot during gait with PWB and orthotic shoe.  Assuming he will follow the "rules", he is safe with a RW at this time.  No further PT needs.    PT Assessment  Patent does not need any further PT services    Follow Up Recommendations  No PT follow up    Barriers to Discharge        lEquipment Recommendations       Recommendations for Other Services     Frequency      Precautions / Restrictions Precautions Required Braces or Orthoses: Other Brace/Splint (wooden or [employer] shoe) Restrictions Weight Bearing Restrictions: Yes LLE Weight Bearing: Partial weight bearing   Pertinent Vitals/Pain       Mobility  Bed Mobility Bed Mobility: Supine to Sit;Sitting - Scoot to Edge of Bed Supine to Sit: 7: Independent Sitting - Scoot to [place] of Bed: 7: Independent Transfers Transfers: Sit to Stand;Stand to Sit Sit to Stand: 7: Independent Stand to Sit: 7: Independent Ambulation/Gait Ambulation/Gait Assistance: 6: Modified independent (Device/Increase time) Ambulation Distance (Feet): 200 Feet Assistive device: Rolling walker Ambulation/Gait Assistance Details: step through gait pattern with PWB easily managed Gait Pattern: Step-through pattern Stairs: Yes Stairs Assistance: 4: Min guard Stair Management Technique: Backwards;With walker Number of Stairs: 2  Wheelchair Mobility Wheelchair Mobility: No    Exercises     PT Diagnosis:    PT Problem List:   PT Treatment Interventions:     PT Goals    Visit Information  Last PT Received On: 08/25/11 Assistance Needed: +1    Subjective Data  Subjective: I can already do it (walk)... I need to use a  walker? Patient Stated Goal: Get home... healed up   Prior Functioning  Home Living Lives With: Spouse;Other (Comment) (but going to his daughter in laws home) Available Help at Discharge: Family Type of Home: House Home Access: Stairs to enter Secretary/administrator of Steps: 3 Entrance Stairs-Rails: Right;Left Home Layout: One level Firefighter: Standard Home Adaptive Equipment: Environmental consultant - rolling;Crutches Prior Function Level of Independence: Independent Able to Take Stairs?: Yes Communication Communication: No difficulties    Cognition  Overall Cognitive Status: Appears within functional limits for tasks assessed/performed Arousal/Alertness: Awake/alert Orientation Level: Oriented X4 / Intact Behavior During Session: [facility] for tasks performed    Extremity/Trunk Assessment Right Upper Extremity Assessment RUE ROM/Strength/Tone: Within functional levels Left Upper Extremity Assessment LUE ROM/Strength/Tone: Within functional levels Right Lower Extremity Assessment RLE ROM/Strength/Tone: Within functional levels Left Lower Extremity Assessment LLE ROM/Strength/Tone: Within functional levels Trunk Assessment Trunk Assessment: Normal   Balance Balance Balance Assessed: No  End of Session PT - End of Session Activity Tolerance: Patient tolerated treatment well Patient left: in bed;with call bell/phone within reach Nurse Communication: Mobility status   Kamika Goodloe, Eliseo Gum 08/25/2011, 11:22 AM  08/25/2011  Goodlow Bing, PT [phone] (pager)

## 2011-08-26 LAB — DIFFERENTIAL
Basophils Absolute: 0.1 10*3/uL (ref 0.0–0.1)
Eosinophils Absolute: 0.6 10*3/uL (ref 0.0–0.7)
Eosinophils Relative: 7 % — ABNORMAL HIGH (ref 0–5)

## 2011-08-26 LAB — BASIC METABOLIC PANEL
CO2: 25 mEq/L (ref 19–32)
Calcium: 9.1 mg/dL (ref 8.4–10.5)
Creatinine, Ser: 1.43 mg/dL — ABNORMAL HIGH (ref 0.50–1.35)
Glucose, Bld: 210 mg/dL — ABNORMAL HIGH (ref 70–99)

## 2011-08-26 LAB — CBC
MCH: 28.5 pg (ref 26.0–34.0)
MCV: 90.5 fL (ref 78.0–100.0)
Platelets: 538 10*3/uL — ABNORMAL HIGH (ref 150–400)
RDW: 12.7 % (ref 11.5–15.5)
WBC: 8.6 10*3/uL (ref 4.0–10.5)

## 2011-08-26 LAB — GLUCOSE, CAPILLARY
Glucose-Capillary: 109 mg/dL — ABNORMAL HIGH (ref 70–99)
Glucose-Capillary: 203 mg/dL — ABNORMAL HIGH (ref 70–99)

## 2011-08-26 MED ORDER — POLYETHYLENE GLYCOL 3350 17 G PO PACK: 17.0000 g | PACK | Freq: Every day | ORAL | Status: AC

## 2011-08-26 MED ORDER — NICOTINE 21 MG/24HR TD PT24: 1.0000 | MEDICATED_PATCH | Freq: Every day | TRANSDERMAL | Status: AC

## 2011-08-26 MED ORDER — OMEPRAZOLE 40 MG PO CPDR: 40.0000 mg | DELAYED_RELEASE_CAPSULE | Freq: Every day | ORAL | Status: DC

## 2011-08-26 MED ORDER — AMPICILLIN 500 MG PO CAPS: 500.0000 mg | ORAL_CAPSULE | Freq: Four times a day (QID) | ORAL | Status: AC

## 2011-08-26 MED ORDER — INSULIN ASPART PROT & ASPART (70-30 MIX) 100 UNIT/ML ~~LOC~~ SUSP: SUBCUTANEOUS | Status: DC

## 2011-08-26 NOTE — Progress Notes (Signed)
INTERNAL MEDICINE DAILY PROGRESS NOTE  [name] MRN: [id number] DOB/AGE: 07-07-1965 46 y.o.  Admit date: 08/21/2011  Subjective: [name] received Left foot I and D of distal first ray and Left fourth ray amputation last night by Dr. August Saucer on 5/16. Now POD 2.   He tells me he has insulin and his antihypertensives available at home. He does not have enough money for antibiotics.Ready to go home without complaint.   Objective: Vital signs in last 24 hours: Filed Vitals:   08/25/11 1343 08/25/11 1700 08/25/11 2115 08/26/11 0541  BP: 132/84 151/87 128/77 123/72  Pulse: 93 97 101 80  Temp: 98 F (36.7 C) 97.8 F (36.6 C) 98 F (36.7 C) 97.8 F (36.6 C)  TempSrc: Oral Oral Oral Oral  Resp: 18 16 16 18   Height:      Weight:      SpO2: 100% 100% 99% 99%   Weight change:   Intake/Output Summary (Last 24 hours) at 08/26/11 0941 Last data filed at 08/26/11 0820  Gross per 24 hour  Intake    960 ml  Output   2550 ml  Net  -1590 ml   Physical Exam: General: resting in bed Cardiac: RRR, no rubs, murmurs or gallops Pulm: clear to auscultation bilaterally, moving normal volumes of air Abd: soft, nontender, nondistended, BS present Ext: warm and well perfused, no pedal edema.  Right foot bandaged c/d/i in orthopedic boot Neuro: alert and oriented X3, cranial nerves II-XII grossly intact  Lab Results: Basic Metabolic Panel  Lab 08/25/11 0642 08/24/11 0645  NA 131* 135  K 4.9 4.4  CL 97 97  CO2 26 24  GLUCOSE 347* 320*  BUN 10 11  CREATININE 1.39* 1.24  CALCIUM 8.7 9.4  MG -- --  PHOS -- --   Liver Function Tests:  Lab 08/22/11 0922  AST 13  ALT 10  ALKPHOS 112  BILITOT 0.5  PROT 7.1  ALBUMIN 2.5*   CBC:  Lab 08/25/11 0642 08/24/11 0645 08/23/11 0540  WBC 10.0 13.7* --  NEUTROABS -- 9.8* 9.6*  HGB 9.7* 11.0* --  HCT 30.0* 33.1* --  MCV 89.3 89.5 --  PLT 482* 576* --   CBG:  Lab 08/26/11 0722 08/25/11 2042 08/25/11 1702 08/25/11 1206 08/25/11 0728  08/24/11 2209  GLUCAP 280* 102* 194* 74 337* 146*   Hemoglobin A1C:  Lab 08/22/11 0006  HGBA1C 14.2*   Fasting Lipid Panel: Lab Results  Component Value Date   CHOL 177 03/28/2011   HDL 61 03/28/2011   LDLCALC 100* 03/28/2011   TRIG 80 03/28/2011   CHOLHDL 2.9 03/28/2011   Urine Drug Screen: Drugs of Abuse     Component Value Date/Time   LABOPIA NONE DETECTED 08/22/2011 0225   COCAINSCRNUR NONE DETECTED 08/22/2011 0225   COCAINSCRNUR NEG 09/22/2008 2125   LABBENZ NONE DETECTED 08/22/2011 0225   LABBENZ NEG 09/22/2008 2125   AMPHETMU NONE DETECTED 08/22/2011 0225   AMPHETMU NEG 09/22/2008 2125   THCU POSITIVE* 08/22/2011 0225   LABBARB NONE DETECTED 08/22/2011 0225    Urinalysis:  Lab 08/22/11 0225  COLORURINE YELLOW  LABSPEC 1.025  PHURINE 5.5  GLUCOSEU 250*  HGBUR NEGATIVE  BILIRUBINUR SMALL*  KETONESUR 15*  PROTEINUR 100*  UROBILINOGEN 1.0  NITRITE NEGATIVE  LEUKOCYTESUR NEGATIVE   Results for [name], [name] (MRN [id number]) as of 08/23/2011 08:54  Ref. Range 08/22/2011 02:25  Microalb, Ur Latest Range: 0.00-1.89 mg/dL 08.65 (H)  Microalb Creat Ratio Latest Range: 0.0-30.0 mg/g  86.2 (H)  Creatinine, Urine No range found 259.0    Micro Results: Recent Results (from the past 240 hour(s))  CULTURE, BLOOD (ROUTINE X 2)     Status: Normal (Preliminary result)   Collection Time   08/21/11 10:40 PM      Component Value Range Status Comment   Specimen Description BLOOD RIGHT ARM   Final    Special Requests BOTTLES DRAWN AEROBIC AND ANAEROBIC 5CC EACH   Final    Culture  Setup Time [id number]   Final    Culture     Final    Value:        BLOOD CULTURE RECEIVED NO GROWTH TO DATE CULTURE WILL BE HELD FOR 5 DAYS BEFORE ISSUING A FINAL NEGATIVE REPORT   Report Status PENDING   Incomplete   CULTURE, BLOOD (ROUTINE X 2)     Status: Normal (Preliminary result)   Collection Time   08/21/11 10:45 PM      Component Value Range Status Comment   Specimen Description BLOOD LEFT  ARM   Final    Special Requests BOTTLES DRAWN AEROBIC AND ANAEROBIC [facility] EACH   Final    Culture  Setup Time [id number]   Final    Culture     Final    Value:        BLOOD CULTURE RECEIVED NO GROWTH TO DATE CULTURE WILL BE HELD FOR 5 DAYS BEFORE ISSUING A FINAL NEGATIVE REPORT   Report Status PENDING   Incomplete   URINE CULTURE     Status: Normal   Collection Time   08/22/11  2:25 AM      Component Value Range Status Comment   Specimen Description URINE, RANDOM   Final    Special Requests NONE   Final    Culture  Setup Time [id number]   Final    Colony Count NO GROWTH   Final    Culture NO GROWTH   Final    Report Status 08/23/2011 FINAL   Final   WOUND CULTURE     Status: Normal   Collection Time   08/22/11  4:30 AM      Component Value Range Status Comment   Specimen Description WOUND LEFT FOOT   Final    Special Requests Normal   Final    Gram Stain     Final    Value: RARE WBC PRESENT,BOTH PMN AND MONONUCLEAR     NO SQUAMOUS EPITHELIAL CELLS SEEN     FEW GRAM POSITIVE RODS     FEW GRAM NEGATIVE RODS     RARE GRAM POSITIVE COCCI IN PAIRS   Culture     Final    Value: MODERATE GROUP B STREP(S.AGALACTIAE)ISOLATED     Note: TESTING AGAINST S. AGALACTIAE NOT ROUTINELY PERFORMED DUE TO PREDICTABILITY OF AMP/PEN/VAN SUSCEPTIBILITY.   Report Status 08/24/2011 FINAL   Final   SURGICAL PCR SCREEN     Status: Normal   Collection Time   08/24/11  5:08 PM      Component Value Range Status Comment   MRSA, PCR NEGATIVE  NEGATIVE  Final    Staphylococcus aureus NEGATIVE  NEGATIVE  Final    Studies/Results: No results found. Medications: I have reviewed the patient's current medications. Scheduled Meds:    . docusate sodium  100 mg Oral BID  . gabapentin  600 mg Oral BID  . heparin subcutaneous  5,000 Units Subcutaneous Q8H  . lisinopril  40 mg Oral Daily   And  .  hydrochlorothiazide  25 mg Oral Daily  . insulin aspart  0-9 Units Subcutaneous TID WC  . insulin aspart  protamine-insulin aspart  40 Units Subcutaneous BID WC  . nicotine  21 mg Transdermal Daily  . pantoprazole  40 mg Oral Q1200  . piperacillin-tazobactam (ZOSYN)  IV  3.375 g Intravenous Q8H  . polyethylene glycol  17 g Oral Daily  . vancomycin  1,000 mg Intravenous Q12H  . 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 Patient is postop day 2 and feeling very well. Will plan on discharging on kefflex. Will continue to optimize glucose control to assist in wound healing. The patient tells me he has prescriptions for insulin and  his antihypertensives. -- will discharge today with [facility] and ortho follow-up.    Active Problems: 2) DM (diabetes mellitus), secondary, uncontrolled, with peripheral vascular complications - current A1c of greater than 14 shows very poor control do to patient being unable to afford medications and has not placing a priority on treating his diabetes. CBGs were not ideally controlled on 35 units of insulin 70/30 so dose was increased to 40 units twice a day, with good result. We will discharge him on this increased dose. -- Insulin 70/30 40 units twice daily -- Sliding scale insulin sensitive  3) HYPERTENSION - blood pressures since admission have been between the 130s and 150s over 70s to 90s systolic. Likely reflects patient's pain, fluid boluses, and acutely infected condition. Blood pressures (especially diastolic) have still been elevated despite adequate pain control. Patient will likely require additional antihypertensive control. Will discharge the patient on his home regimen as he will not be  able to afford amlodipine. Will suggest restarting amlodipine once health coverage kicks in. - starting amlodipine 5 mg daily - continue lisinopril 40- HCTZ 25   4) Chronic kidney disease, stage 2, mildly decreased GFR - . Etiology is likely secondary to diabetes and hypertension. Urine microalb creat ratio almost 90. Patient already on ACEi. Monitoring creatinine closely given that the patient is on vancomycin and naproxen and ACE inhibitor. Creatinine slightly increased POD 1 to 1.4, likely secondary to being n.p.o. overnight with surgery. Naproxen was held. Today creatinine is stable at 1.43. -- Will have creatinine checked at his clinic followup in one week  5) Inadequate material resources - patient's finances combined with his laxidaisical approach to treating his diabetes is the source of his current woes. Will obtain social work consult to assist patient in his medical care. States he is 2 weeks away from getting MAP assistance. WIll plan on switch to long acting + meal coverage at that time. Will have our social worker discuss with patient getting disability  6) Anemia - likely secondary to anemia of chronic disease per anemia panel drawn March 2012. Stable at baseline post surgery.   7) DIABETIC PERIPHERAL NEUROPATHY - stable, gabapentin restarted -- Gabapentin 600 mg twice a day  8) TOBACCO ABUSE - stable continue nicotine patch. Will provide prescription for nicotine patches  9) DVT prophylaxis -3 times a day heparin  Disposition - today to home with free antibiotics from [employer].    LOS: 5 days   Nansi Birmingham 08/26/2011, 9:41 AM

## 2011-08-26 NOTE — Progress Notes (Signed)
Orthopedic Tech Progress Note Patient Details:  [name] 08-May-1965 [id number]  Other Ortho Devices Ortho Device Location: darco shoe Ortho Device Interventions: Application   [name], Wesley Bail 08/26/2011, 10:46 AM

## 2011-08-26 NOTE — Progress Notes (Signed)
Pt is for discharge today to home accomp by wife and son.  Pt has Darco shoe in place and feels comfortable with it, he has ambulated in the hallway with good toleration.  Bus passes given to pt from SW.  Rx printed.  [facility] and has called the pt in the room to verify that they will come to the pts home on Sunday, 08/27/11.  No further questions about home self care.

## 2011-08-26 NOTE — Discharge Instructions (Signed)
Keep your incisions clean and dry.  Put weight only thru left heel. Change dressing daily. Call [facility] [phone] for an appointment with Dr. August Saucer next week.  Bone and Joint Infections Joint infections are called septic or infectious arthritis. An infected joint may damage cartilage and tissue very quickly. This may destroy the joint. Bone infections (osteomyelitis) may last for years. Joints may become stiff if left untreated. Bacteria are the most common cause. Other causes include viruses and fungi, but these are more rare. Bone and joint infections usually come from injury or infection elsewhere in your body; the germs are carried to your bones or joints through the bloodstream.  CAUSES   Blood-carried germs from an infection elsewhere in your body can eventually spread to a bone or joint. The germ staphylococcus is the most common cause of both osteomyelitis and septic arthritis.   An injury can introduce germs into your bones or joints.  SYMPTOMS   Weight loss.   Tiredness.   Chills and fever.   Bone or joint pain at rest and with activity.   Tenderness when touching the area or bending the joint.   Refusal to bear weight on a leg or inability to use an arm due to pain.   Decreased range of motion in a joint.   Skin redness, warmth, and tenderness.   Open skin sores and drainage.  RISK FACTORS Children, the elderly, and those with weak immune systems are at increased risk of bone and joint infections. It is more common in people with HIV infections and with people on chemotherapy. People are also at increased risk if they have surgery where metal implants are used to stabilize the bone. Plates, screws, or artificial joints provide a surface that bacteria can stick on. Such a growth of bacteria is called biofilm. The biofilm protects bacteria from antibiotics and bodily defenses. This allows germs to multiply. Other reasons for increased risks include:   Having previous  surgery or injury of a bone or joint.   Being on high-dose corticosteroids and immunosuppressive medications that weaken your body's resistance to germs.   Diabetes and long-standing diseases.   Use of intravenous street drugs.   Being on hemodialysis.   Having a history of urinary tract infections.   Removal of your spleen (splenectomy). This weakens your immunity.   Chronic viral infections such as HIV or AIDS.   Lack of sensation such as paraplegia, quadriplegia, or spina bifida.  DIAGNOSIS   Increased numbers of white blood cells in your blood may indicate infection. Some times your caregivers are able to identify the infecting germs by testing your blood. Inflammatory markers present in your bloodstream such as an erythrocyte sedimentation rate (ESR or sed rate) or c-reactive protein (CRP) can be indicators of deep infection.   Bone scans and X-ray exams are necessary for diagnosing osteomyelitis. They may help your caregiver find the infected areas. Other studies may give more detailed information. They may help detect fluid collections around a joint, abnormal bone surfaces, or be useful in diagnosing septic arthritis. They can find soft tissue swelling and find excess fluid in an infected joint or the adjacent bone. These tests include:   Ultrasound.   CT (computerized tomography).   MRI (magnetic resonance imaging).   The best test for diagnosing a bone or joint infection is an aspiration or biopsy. Your caregiver will usually use a local anesthetic. He or she can then remove tissue from a bone injury or use a needle  to take fluids from an infected joint. A local anesthetic medication numbs the area to be biopsied. Often biopsies are done in the operating room under general anesthesia. This means you will be asleep during the procedure. Tests performed on these samples can identify an infection.  TREATMENT   Treatment can help control long-standing infections, but infections  may come back.   Infections can infect any bone or joint at any age.   Bone and joint infections are rarely fatal.   Bone infection left untreated can become a never-ending infection. It can spread to other areas of your body. It may eventually cause bone death. Reduced limb or joint function can result. In severe cases, this may require removal of a limb. Spinal osteomyelitis is very dangerous. Untreated, it may damage spinal nerves and cause death.   The most common complication of septic arthritis is osteoarthritis with pain and decreased range of motion of the joint.  Some forms of treatment may include:  If the infection is caused by bacteria, it is generally treated with antibiotics. You will likely receive the drugs through a vein (intravenously) for anywhere from 2 to 6 weeks. In some cases, especially with children, oral antibiotics following an initial intravenous dose may be effective. The treatment you receive depends on the:   Type of bacteria.   Location of the infection.   Type of surgery that might be done.   Other health conditions or issues you might have.   Your caregiver may drain soft tissue abscesses or pockets of fluid around infected bones or joints. If you have septic arthritis, your caregiver may use a needle to drain pus from the joint on a daily basis. He or she may use an arthroscope to clean the joint or may need to open the joint surgically to remove damaged tissue and infection. An arthroscope is an instrument like a thin lighted telescope. It can be used to look inside the joint.   Surgery is usually needed if the infection has become long-standing. It may also be needed if there is hardware (such as metal plates, screws, or artificial joints) inside the patient. Sometimes a bone or muscle graft is needed to fill in the open space. This promotes growth of new tissues and better blood flow to the area.  PREVENTION   Clean and disinfect wounds quickly to help  prevent the start of a bone or joint infection. Get treatment for any infections to prevent spread to a bone or joint.   Do not smoke. Smoking decreases healing rates of bone and predisposes to infection.   When given medications that suppress your immune system, use them according to your caregiver's instructions. Do not take more than prescribed for your condition.   Take good care of your feet and skin, especially if you have diabetes, decreased sensation or circulation problems.  SEEK IMMEDIATE MEDICAL CARE IF:   You cannot bear weight on a leg or use an arm, especially following a minor injury. This can be a sign of bone or joint infection.   You think you may have signs or symptoms of a bone or joint infection. Your chance of getting rid of an infection is better if treated early.  Document Released: 03/27/2005 Document Revised: 03/16/2011 Document Reviewed: 02/24/2009 [facility] Patient Information 2012 [place], [place].

## 2011-08-26 NOTE — Progress Notes (Signed)
Pt to home and to go by [facility] to get Rx filled.  Pt understands.  Pt wanted to walk downstairs, Darco shoe intact.  Pt given full copy of discharge instructions and understands FU care.

## 2011-08-26 NOTE — Progress Notes (Signed)
Patient ID: [name], male   DOB: [birth year], 46 y.o.   MRN: [id number] I changed the dressings on [name]'s left foot.  All the wounds and sutures look good.  No purulent drainage.  New dressing applied.  Will order darco shoe for weight-bearing thru his heel.  He will need to place new xeroform and dry dressing daily on his left foot until seen by Dr. August Saucer in the office next week.

## 2011-08-26 NOTE — Discharge Summary (Signed)
Internal Medicine Teaching [facility] Discharge Note  Name: [name] MRN: [id number] DOB: 06-19-1965 46 y.o.  Date of Admission: 08/21/2011  5:57 PM Date of Discharge: 08/26/2011 Attending Physician: Rocco Serene, MD  Discharge Diagnosis: Principal Problem:  *Osteomyelitis of foot, acute, left Active Problems:  DM (diabetes mellitus), secondary, uncontrolled, with peripheral vascular complications  HYPERTENSION  Chronic kidney disease, stage 2, mildly decreased GFR  Inadequate material resources  DIABETIC PERIPHERAL NEUROPATHY  TOBACCO ABUSE  Diabetic foot ulcer   Discharge Medications: Medication List  As of 08/26/2011  3:50 PM   TAKE these medications         ampicillin 500 MG capsule   Commonly known as: PRINCIPEN   Take 1 capsule (500 mg total) by mouth 4 (four) times daily. Take for 1 week.      gabapentin 300 MG capsule   Commonly known as: NEURONTIN   Take 2 capsules (600 mg total) by mouth 2 (two) times daily.      HYDROcodone-acetaminophen 5-500 MG per tablet   Commonly known as: VICODIN   Take 1 tablet by mouth every 6 (six) hours as needed for pain.      insulin aspart protamine-insulin aspart (70-30) 100 UNIT/ML injection   Commonly known as: NOVOLOG 70/30   Subcutaneously inject 40 units in the morning and 40 units before the evening meal      nicotine 21 mg/24hr patch   Commonly known as: NICODERM CQ - dosed in mg/24 hours   Place 1 patch onto the skin daily.      omeprazole 40 MG capsule   Commonly known as: PRILOSEC   Take 1 capsule (40 mg total) by mouth daily. You can find this medication in generic form available over-the-counter.      polyethylene glycol packet   Commonly known as: MIRALAX / GLYCOLAX   Take 17 g by mouth daily.      quinapril-hydrochlorothiazide 20-12.5 MG per tablet   Commonly known as: ACCURETIC   Take 2 tablets by mouth daily.           Disposition and follow-up:   [name] was discharged from  [facility] in Good condition.  [name] had received surgery to remove the infection from his foot and his insulin was titrated up for better glucose control. [name] was discharged to home with home health wound care. Our clinic will call him to make a followup appointment on Monday with an [facility] resident and with [place] our Child psychotherapist. If [name] does not hear from our clinic, [name] is supposed to call us himself  FOLLOW UP APPOINTMENTS Follow-up Information    Schedule an appointment as soon as possible for a visit with Kathreen Cosier, MD. (Call Monday to be seen in clinc)    Contact information:   [address] [place] [address] [phone]       Schedule an appointment as soon as possible for a visit with Cammy Copa, MD. (Call and be see in 1 week)    Contact information:   [facility] [address] [place] [address] [phone]          Follow-up Appointments: Discharge Orders    Future Appointments: Provider: Department: Dept Phone: Center:   09/05/2011 1:15 PM Daryel Gerald, MD [facility] [phone] [facility]   09/22/2011 8:00 AM [facility] [phone] [facility]     Future Orders Please Complete By Expires  Diet - low sodium heart healthy      Diet Carb Modified      Increase activity slowly      Call MD for:  temperature >100.4      Call MD for:  persistant nausea and vomiting      Call MD for:  severe uncontrolled pain      Call MD for:  redness, tenderness, or signs of infection (pain, swelling, redness, odor or green/yellow discharge around incision site)      Call MD for:  difficulty breathing, headache or visual disturbances      Call MD for:  persistant dizziness or light-headedness      Discharge wound care:      Comments:   A wound care nurse will come to your house to help you change your bandage daily.   Call MD for:  hives       Discharge instructions      Comments:   Go to [facility] and obtain a VIC card.  Your antibiotics should be free. If your antibiotics or not free please call the clinic at [phone] and have the physician on call paged.   Other Restrictions      Comments:   Try to limit time up on foot until you can follow up with Dr. August Saucer      At his followup appointment in the [facility], The following item should be addressed: #1 please check BMP. The patient had mild increase in creatinine after surgery. #2 please check status of antibiotic course and insulin therapy #3 please ask about symptoms of hypoglycemia as the patient's insulin regimen was increased. #4 Consider adding amlodipine 5 mg daily to the patient's antihypertensive therapy after [name] enrolls in MAP.  At his followup appointment with Dr. August Saucer, the following items should be addressed: #1 please address whether the patient will require further antibiotics. [name] was placed on Ampicillin for the predominantly strep spp, found in his culture.  Consultations: Orthopedic surgery, wound care  Procedures Performed:  X-ray Chest Pa And Lateral   08/22/2011  *RADIOLOGY REPORT*  Clinical Data: Fever  CHEST - 2 VIEW  Comparison: 06/13/2010  Findings: Right-sided PICC line has been removed.  Heart size normal.  The lungs are clear.  No pleural effusion.  No acute osseous finding.  IMPRESSION: No acute cardiopulmonary process.  Original Report Authenticated By: Harrel Lemon, M.D.   Dg Os Calcis Right  08/18/2011  *RADIOLOGY REPORT*  Clinical Data: Possible osteomyelitis, ulcer on foot, diabetes, healed pain  RIGHT OS CALCIS - 2+ VIEW  Comparison: None.  Findings: Two views of the right os calcis show no evidence of osteomyelitis.  The cortical margins of the right calcaneus are well preserved and no focal demineralization is seen.  IMPRESSION: Negative right os calcis.  Original Report Authenticated By: Juline Patch, M.D.   Mr Foot Left W Wo  Contrast  08/24/2011  *RADIOLOGY REPORT*  Clinical Data: Foot osteomyelitis.  Left foot pain.  History of debridement of callus.  Fever, sweats, nausea and vomiting. Diabetes.  Multiple amputations.  The  MRI OF THE LEFT FOREFOOT WITHOUT AND WITH CONTRAST  Technique:  Multiplanar, multisequence MR imaging was performed both before and after administration of intravenous contrast.  Contrast: 20mL MULTIHANCE GADOBENATE DIMEGLUMINE 529 MG/ML IV SOLN radiographs 08/21/2011.  Comparison: None.  Findings: There is osteomyelitis of the fourth toe.  This extends from the middle phalanx to the metatarsal neck.  The fourth MTP joint appears septic.  There is a dorsal soft tissue abscess overlying the fourth MTP joint that measures 1 cm AP, 14 mm transverse and at least 13 mm in the long axis of the foot. Reactive inflammatory changes are present around the fourth MTP joint.  Hypertrophic cortex is present in the second third metatarsal shaft.  Amputation of the phalanges of the great toe and distal first metatarsal.  There is an abnormal marrow signal in the distal first metatarsal however this does not appear represent osteomyelitis.  There is no radiating bone marrow edema.  The muscular signal demonstrates infectious myositis and cellulitis surrounds the foot circumferentially, more prominent dorsally.  IMPRESSION: 1.  Septic arthritis of the fourth MTP joint with surrounding phlegmon.  Osteomyelitis of the proximal phalanx and fourth metatarsal head.  Radiating bone marrow edema extends to the proximal fourth metatarsal shaft. 2.  Reactive inflammatory changes of the third and fifth toes without evidence of osteomyelitis. 3.  Small approximate 1.5 cm soft tissue abscess dorsal to the first MTP joint.  Original Report Authenticated By: Andreas Newport, M.D.   Dg Foot Complete Left  08/21/2011  *RADIOLOGY REPORT*  Clinical Data: Diabetes.  Infection in all remaining toes.  LEFT FOOT - COMPLETE 3+ VIEW  Comparison: Left  foot 08/18/2011 from [facility]  Findings: Postoperative changes of amputation of the left first toe at the level of the distal metatarsal bone.  As shown on the previous study, there is again evidence of focal erosion in the distal aspect of the first metatarsal bone, and in the second and third metatarsal heads.  Soft tissue swelling and soft tissue gas is suggested.  Changes suggest soft tissue infection with early osteomyelitis.  Callus formation and periosteal reaction along the distal aspect of the first through third metatarsal bones.  No evidence of acute fracture.  IMPRESSION: Prior amputation of the left first toe.  Bone erosions, sclerosis, and periosteal reaction in the distal left first through third metatarsal bones suggest early osteomyelitis.  Soft tissue swelling and soft tissue gas consistent with cellulitis.  Stable appearance since previous study.  Original Report Authenticated By: Marlon Pel, M.D.   Dg Foot Complete Left  08/18/2011  *RADIOLOGY REPORT*  Clinical Data: Sore on the left foot, history of diabetes, right heel pain  LEFT FOOT - COMPLETE 3+ VIEW  Comparison: Left foot films of 09/11/2010  Findings: There is indistinctness of the cortical margin of the stump of the left first partially amputated metatarsal with demineralization suspicious for osteomyelitis. Osteomyelitis of the head - neck of the left second and third metatarsals cannot be excluded.  Healed fractures of the left second and third metatarsals are noted.  There is also slight demineralization of the base of the left first metatarsal and osteomyelitis cannot be excluded at the articulation with the first cuneiform.  A small plantar calcaneal degenerative spur is noted.  IMPRESSION:  1.  Possible early osteomyelitis involving the distal tip of the partially amputated left first metatarsal. 2.  Cannot exclude early osteomyelitis involving the head - neck of the left second and third metatarsals. 3.   Focal demineralization of the base of the left first metatarsal.  Cannot exclude early osteomyelitis.  Original Report Authenticated By: Juline Patch, M.D.   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 Ardath Sax. At that time cultures were taken and XRay obtain for suspicion of osteomyelitis with plan to begin antibiotics on follow-up visit. [name] states that the has had aching pain over left toes with new swelling and drainage from left toes. [name] reports fevers, sweats, nausea, and vomiting. Denies shortness of breath, chest pain, abdominal pain, change in bowel habits. Reports an episode of "passing out" about a week ago of which [name] did not seek treatment.    Admission Physical exam and labs: Physical Exam:  Blood pressure 139/98, pulse 107, temperature 99.2 F (37.3 C), temperature source Oral, resp. rate 18, SpO2 95.00%. General: Well-developed, well-nourished, African American man, in no acute distress; lying flat in bed Head: Normocephalic, atraumatic. Eyes: PERRLA, EOMI,murky sclera Neck: supple, no masses appreciated Lungs: Normal respiratory effort. Clear to auscultation bilaterally from apices to bases without crackles or wheezes appreciated. Heart: normal rate, regular rhythm, normal S1 and S2, no gallop, murmur, or rubs appreciated. Abdomen: BS normoactive. Soft, Nondistended, non-tender. No masses or organomegaly appreciated. Extremities: No pretibial edema, pedal pulses intact bilaterally, Bilateral LE extremities warm to touch up to knees, bilateral feet  with multiple areas of hyperpigmentation, lichenification and calluses on plantar surface and at great toe amputation sites, plantar aspect of left foot with quarter sized ulcer ~1cm deep, left 2-3 digits with crack fissures and oozing serosanguinous discharge  Neurologic: grossly non-focal, alert and oriented x3, appropriate and cooperative throughout examination.   Lab results:  Basic Metabolic Panel:   [facility]  08/21/11 2257   NA  138   K  4.2   CL  105   CO2  --   GLUCOSE  200*   BUN  16   CREATININE  1.40*    CBC:   Basename  08/21/11 2257  08/21/11 2236   WBC  --  16.9*   HGB  11.9*  10.7*   HCT  35.0*  33.2*   MCV  --  89.5   PLT  --  515*    CBG:   Basename  08/21/11 2051   GLUCAP  [facility] Course by problem list: 1) Diabetic foot ulcer with probable Osteomyelitis of foot, acute, left -. Mr. Seier was admitted for left foot osteomyelitis and started on broad spectrum antibiotics.  After MRI confirmed the extent of infection, Dr August Saucer from orthopedic surgery performed left fourth ray amputation and first metatarsal debridement on 5/16. Mr. Jury did very well in the post op period and was discharged on postop day 2 feeling very well. [name] was instructed that good glucose control and smoking avoidance would be critical in wound healing. The patient tells me [name] has prescriptions for insulin and his antihypertensives, but could not afford the $4 prescription for antibiotics.  [name] was told to obtain a [facility] card as they provide free prescriptions for antibiotics and was given a prescription for ampicillin.  His wound cultures grew predominantly strep spp. [facility] will come by to perform/teach daily wound changes.    Active Problems:  2) DM (diabetes mellitus), secondary, uncontrolled, with peripheral vascular complications -Mr Byer's current A1c of greater than 14 shows very poor controll. [name] informed me that [name] does have insulin at home. CBGs were  suboptimally controlled his home dose  of 35 units of insulin 70/30 so dose was increased to 40 units twice a day, with some improvement. [name] was discharged on this increased dose.   3) HYPERTENSION - blood pressures on admission ranged from the 130-150s over 70-90s. Blood pressures (especially diastolic) were persistantly elevated despite adequate pain control. Amlodipine was added to his regimen with a satisfactory decrease in blood pressures. Mr. Mier was discharged his home antihypertensive regimen as [name] will not be able to afford amlodipine.  Suggest restarting amlodipine once health coverage kicks in.   4) Chronic kidney disease, stage 2, mildly decreased GFR - Mr. Hatler's renal disease is likely secondary to poorly controlled diabetes and hypertension. Urine microalb creat ratio was almost 90. Renal function was closely monitored given that the patient was vancomycin, naproxen, and ACE inhibitor. Creatinine was slightly increased POD 1 to 1.4, likely secondary to being n.p.o. overnight with surgery. Naproxen was held. At discharge Today creatinine was stable at 1.43 with the plan to have creatinine rechecked at his follow up appointment   5) Inadequate material resources - patient's finances combined with his laxidaisical approach to treating his diabetes is the source of his current woes. We obtained social work consult to assist patient in his medical care. [name] states [name] is 2 weeks away from getting MAP assistance. [name] was discharged with instructions to follow up with our CSW in the [facility].   6) Anemia - likely secondary to anemia of chronic disease per anemia panel drawn March 2012. Hb was stable at baseline post surgery.   7) DIABETIC PERIPHERAL NEUROPATHY - stable, gabapentin was continued   8) TOBACCO ABUSE - stable, nicotine patch was provided and prescription for nicotine patches were given at discharge.    Discharge Vitals:  BP 161/90  Pulse 94  Temp(Src) 98.1 F (36.7 C) (Oral)  Resp 18   Ht 5\' 8"  (1.727 m)  Wt 205 lb 11 oz (93.3 kg)  BMI 31.27 kg/m2  SpO2 100%  Discharge Labs:  Results for orders placed during the hospital encounter of 08/21/11 (from the past 24 hour(s))  GLUCOSE, CAPILLARY     Status: Abnormal   Collection Time   08/25/11  5:02 PM      Component Value Range   Glucose-Capillary 194 (*) 70 - 99 (mg/dL)   Comment 1 Notify RN    GLUCOSE, CAPILLARY     Status: Abnormal   Collection Time   08/25/11  8:42 PM      Component Value Range   Glucose-Capillary 102 (*) 70 - 99 (mg/dL)  GLUCOSE, CAPILLARY     Status: Abnormal   Collection Time   08/26/11  7:22 AM      Component Value Range   Glucose-Capillary 280 (*) 70 - 99 (mg/dL)   Comment 1 Notify RN    BASIC METABOLIC PANEL     Status: Abnormal   Collection Time   08/26/11 10:04 AM      Component Value Range   Sodium 135  135 - 145 (mEq/L)   Potassium 4.6  3.5 - 5.1 (mEq/L)   Chloride 99  96 - 112 (mEq/L)   CO2 25  19 - 32 (mEq/L)   Glucose, Bld 210 (*) 70 - 99 (mg/dL)   BUN 12  6 - 23 (mg/dL)   Creatinine, Ser 7.84 (*) 0.50 - 1.35 (mg/dL)   Calcium 9.1  8.4 - 69.6 (mg/dL)   GFR calc non Af Amer 58 (*) >90 (mL/min)   GFR calc Af Amer 67 (*) >  90 (mL/min)  CBC     Status: Abnormal   Collection Time   08/26/11 10:04 AM      Component Value Range   WBC 8.6  4.0 - 10.5 (K/uL)   RBC 3.47 (*) 4.22 - 5.81 (MIL/uL)   Hemoglobin 9.9 (*) 13.0 - 17.0 (g/dL)   HCT 16.1 (*) 09.6 - 52.0 (%)   MCV 90.5  78.0 - 100.0 (fL)   MCH 28.5  26.0 - 34.0 (pg)   MCHC 31.5  30.0 - 36.0 (g/dL)   RDW 04.5  40.9 - 81.1 (%)   Platelets 538 (*) 150 - 400 (K/uL)  DIFFERENTIAL     Status: Abnormal   Collection Time   08/26/11 10:04 AM      Component Value Range   Neutrophils Relative 55  43 - 77 (%)   Neutro Abs 4.8  1.7 - 7.7 (K/uL)   Lymphocytes Relative 30  12 - 46 (%)   Lymphs Abs 2.6  0.7 - 4.0 (K/uL)   Monocytes Relative 7  3 - 12 (%)   Monocytes Absolute 0.6  0.1 - 1.0 (K/uL)   Eosinophils Relative 7 (*) 0 - 5  (%)   Eosinophils Absolute 0.6  0.0 - 0.7 (K/uL)   Basophils Relative 1  0 - 1 (%)   Basophils Absolute 0.1  0.0 - 0.1 (K/uL)  GLUCOSE, CAPILLARY     Status: Abnormal   Collection Time   08/26/11 11:41 AM      Component Value Range   Glucose-Capillary 109 (*) 70 - 99 (mg/dL)   Comment 1 Notify RN      Time spent in discharge (includes decision making & examination of pt): at least 45 minutes.    Signed: Brenden Rudman 08/26/2011, 3:50 PM

## 2011-08-26 NOTE — Progress Notes (Signed)
   CARE MANAGEMENT NOTE 08/26/2011  Patient:  [name], [name]   Account Number:  [id number]  Date Initiated:  08/22/2011  Documentation initiated by:  [name]  Subjective/Objective Assessment:   DX:  Osteomyelitis/Cellulitis of Left foot     Action/Plan:   Anticipated DC Date:  08/25/2011   Anticipated DC Plan:  HOME W HOME HEALTH SERVICES      DC Planning Services  CM consult  Medication Assistance      [facility] Choice  HOME HEALTH   Choice offered to / List presented to:  C-1 Patient        HH arranged  HH-1 RN      [facility] agency  [facility]   Status of service:  Completed, signed off Medicare Important Message given?   (If response is "NO", the following Medicare IM given date fields will be blank) Date Medicare IM given:   Date Additional Medicare IM given:    Discharge Disposition:  HOME W HOME HEALTH SERVICES  Per UR Regulation:  Reviewed for med. necessity/level of care/duration of stay  If discussed at Long Length of Stay Meetings, dates discussed:    Comments:  08/26/2011 1300 Contacted [facility] for [facility] RN for scheduled d/c home today. Will use ZZ medication fund for abx and insulin at d/c. Explained to pt that fund can be used once per year. Pt opted not to use the ZZ fund for his medication. He will go to [facility] to try to obtain free abx with his VIC card.  Wesley Donning RN CCN Case Mgmt phone [phone]  08-25-11 Know patient from previous admissions . In past he has received help from [employer] and had an orange card.  Tried to speak with patient regarding status of his orange card, home situation etc. However, patient was on the phone and dismissed me.  Patient has not used ZZ fund from main  pharmacy for over a year , so he is eligible . Wesley Flurry RN BSN   MD admission note : Social Issues: Patient has a history of medication noncompliance and no show for follow ups especially wound care clinic. Our clinic has spent great effort to assist  patient financially such as gift card to [employer], free antibiotics for his infection, housing at the extended hotel, bus passes so he can come to his appointments. At some point, patient needs to be responsible for his own health. Will consult SW while he is in patient.  CSW spoke with patient regarding SCAT application. Patient was agreeable to complete application.

## 2011-08-28 LAB — CULTURE, BLOOD (ROUTINE X 2)
Culture  Setup Time: 201305140343
Culture: NO GROWTH
Culture: NO GROWTH

## 2011-08-30 ENCOUNTER — Ambulatory Visit (INDEPENDENT_AMBULATORY_CARE_PROVIDER_SITE_OTHER): Payer: Self-pay | Admitting: Internal Medicine

## 2011-08-30 ENCOUNTER — Encounter: Payer: Self-pay | Admitting: Ophthalmology

## 2011-08-30 ENCOUNTER — Telehealth: Payer: Self-pay | Admitting: Licensed Clinical Social Worker

## 2011-08-30 VITALS — BP 134/89 | HR 95 | Temp 97.2°F | Ht 68.0 in | Wt 203.0 lb

## 2011-08-30 DIAGNOSIS — E119 Type 2 diabetes mellitus without complications: Secondary | ICD-10-CM

## 2011-08-30 LAB — GLUCOSE, CAPILLARY: Glucose-Capillary: 127 mg/dL — ABNORMAL HIGH (ref 70–99)

## 2011-08-30 MED ORDER — HYDROCODONE-ACETAMINOPHEN 5-500 MG PO TABS: 1.0000 | ORAL_TABLET | Freq: Four times a day (QID) | ORAL | Status: DC | PRN

## 2011-08-30 NOTE — Progress Notes (Signed)
Patient ID: [name], male   DOB: [birth year], 46 y.o.   MRN: [id number]  History of present illness: Patient is a 46 year old male who was recently discharged from the hospital for a repeat amputation on his right foot of one digit. He was discharged from hospital with only Tylenol because of financial constraints he was unable to afford pain medication at that time it did not requesting prescription. He wouldn't at this time like pain medication because he feels he is taking too many Tylenol and does not wish to damage his stomach. Will give him prescription for 10 Vicodin pills. He will see his primary care doctor next week on 5/28. No nausea, vomiting, chest pain, shortness of breath. No increasing pain, redness, swelling, coldness of his right extremity.  Medications: Reviewed  Allergies: Reviewed  Review of systems: See history of present illness  Physical exam: Heart exam normal Lung exam normal Right foot with one additional amputation and one additional cleaning up of the previous amputation site foot is wrapped in gauze. Will not unwrap as patient is going straight orthopedic visit and this would require inordinate amount of time.  Assessment and plan:  1. Post operative pain-patient will be given 10 Vicodin pills to get him through to his followup visit with his primary care doctor on 5/28. He is not having inordinate amount of pain however only was discharged with Tylenol due to financial constraints. Would like to have stronger medication at this time. We'll let his primary care Dr. consider whether long-term course is appropriate or not.  2. Amputation-patient does have amputation of additional toe on right foot. Is going to orthopedic visit in next and we'll allow them to continue management of pain and management of foot.

## 2011-08-30 NOTE — Telephone Encounter (Signed)
CSW received call from pt requesting bus passes.  Mr. Bilyeu has several scheduled appt's 5/22, 5/28, and 6/14.  CSW returned call to Mr. Jurek, CSW has available one 31-day bus pass CSW received from inpt CSW.  CSW informed pt bus pass will be left at front desk.  CSW will initiate SCAT application.

## 2011-08-30 NOTE — Patient Instructions (Signed)
We saw you briefly today for your foot and will let the orthopedic doctor manage the plan and will let your regular doctor decide on pain meds next week and will give you 10 vicodin pills to help you until then. There will be no charge for this visit as it was not a full visit.

## 2011-09-05 ENCOUNTER — Encounter: Payer: Self-pay | Admitting: Internal Medicine

## 2011-09-05 ENCOUNTER — Ambulatory Visit (INDEPENDENT_AMBULATORY_CARE_PROVIDER_SITE_OTHER): Payer: Medicaid Other | Admitting: Internal Medicine

## 2011-09-05 VITALS — BP 131/85 | HR 107 | Temp 98.0°F | Resp 20 | Ht 70.5 in | Wt 201.5 lb

## 2011-09-05 DIAGNOSIS — F329 Major depressive disorder, single episode, unspecified: Secondary | ICD-10-CM

## 2011-09-05 DIAGNOSIS — E1159 Type 2 diabetes mellitus with other circulatory complications: Secondary | ICD-10-CM

## 2011-09-05 DIAGNOSIS — I10 Essential (primary) hypertension: Secondary | ICD-10-CM

## 2011-09-05 DIAGNOSIS — F17200 Nicotine dependence, unspecified, uncomplicated: Secondary | ICD-10-CM

## 2011-09-05 DIAGNOSIS — M86179 Other acute osteomyelitis, unspecified ankle and foot: Secondary | ICD-10-CM

## 2011-09-05 DIAGNOSIS — E1351 Other specified diabetes mellitus with diabetic peripheral angiopathy without gangrene: Secondary | ICD-10-CM

## 2011-09-05 DIAGNOSIS — N182 Chronic kidney disease, stage 2 (mild): Secondary | ICD-10-CM

## 2011-09-05 DIAGNOSIS — F32A Depression, unspecified: Secondary | ICD-10-CM | POA: Insufficient documentation

## 2011-09-05 DIAGNOSIS — Z590 Homelessness: Secondary | ICD-10-CM

## 2011-09-05 DIAGNOSIS — I798 Other disorders of arteries, arterioles and capillaries in diseases classified elsewhere: Secondary | ICD-10-CM

## 2011-09-05 DIAGNOSIS — M86172 Other acute osteomyelitis, left ankle and foot: Secondary | ICD-10-CM

## 2011-09-05 DIAGNOSIS — E119 Type 2 diabetes mellitus without complications: Secondary | ICD-10-CM

## 2011-09-05 LAB — GLUCOSE, CAPILLARY: Glucose-Capillary: 297 mg/dL — ABNORMAL HIGH (ref 70–99)

## 2011-09-05 MED ORDER — INSULIN ASPART PROT & ASPART (70-30 MIX) 100 UNIT/ML ~~LOC~~ SUSP: SUBCUTANEOUS | Status: DC

## 2011-09-05 MED ORDER — FLUOXETINE HCL 20 MG PO CAPS: 20.0000 mg | ORAL_CAPSULE | Freq: Every day | ORAL | Status: DC

## 2011-09-05 NOTE — Assessment & Plan Note (Signed)
Pt describes worsening anhedonia and decreased concentration, some poor sleep.  Has been frustrated by con't health problems.  Feels like giving up.  No SI, happy with new marriage. Discussed depression a bit with pt and options b/w pharmacotherapy and psychotherapy.  Discussed [facility] and their drop in hours, but transportation is always an issue. - decided on prozac 20mg  6 wk trial.  Chose prozac given cost (hopefully available at [employer]) - defer [facility] referral for now - reassess in 6 wks in clinic. Can con't alternative SSRI if available at pharmacy

## 2011-09-05 NOTE — Assessment & Plan Note (Signed)
BP Readings from Last 3 Encounters:  09/05/11 131/85  08/30/11 134/89  08/26/11 161/90   BP getting more under control now that he got meds.  Taking accuretic.  Can consider adding amlodipine next visit if feasible.  Less is more with this pt. - BMP today given new ACEi usage at d/c

## 2011-09-05 NOTE — Assessment & Plan Note (Signed)
Already had first f/u appt with Dr. August Saucer (ortho), who said all was good.  Will f/u once more for suture removal next week.  Has attended wound care appt and will again go tomorrow.  No s/s of infection, but I did not undress wound today. - f/u with ortho, wound care

## 2011-09-05 NOTE — Progress Notes (Signed)
Subjective:     Patient ID: [name], male   DOB: October 24, 1965, 46 y.o.   MRN: [id number]  HPI Pt is a 46 yo M with a h/o poorly controlled DMII, HTN, h/o PSA, and CKD who was recently d/c'ed from (08/26/11) [facility] after L fourth ray amputation for osteomyelitis.  He presents for f/u  Pt did obtain and finish his abx as prescribed (1 week of ampicillin).  Did have wound care come by once to his house.  Has followed up with orthopedics.  He does have f/u with wound care.  [facility] [place] [facility].  Pt did obtain a bus pas, which has allowed him to make appointments this month.  Review of Systems No F/C, no SOB/CP, no N/V.     Objective:   Physical Exam Filed Vitals:   09/05/11 1326  BP: 131/85  Pulse: 107  Temp: 98 F (36.7 C)  Resp: 20  Gen: middle aged man in NAD, pleasant, cooperative L foot: bandaged and in boot, dressing clean/dri/intact    Assessment:         Plan:

## 2011-09-05 NOTE — Assessment & Plan Note (Signed)
Pt has not smoked since d/c.  Did not use patches.  Is interested in getting them, but has been able to remain tobacco free.  I congratulated him on this and urged him to continue his excellent efforts.

## 2011-09-05 NOTE — Assessment & Plan Note (Signed)
Pt still waiting on MAP, still marginally housed.  Did get bus pass for 30 days after d/c, which makes going to all these appts much more reasonable.  Is actually compliant when he has transportation and access to meds.  Got married April 1, I wished him congrats.

## 2011-09-05 NOTE — Assessment & Plan Note (Signed)
Pt got insulin 70/30 after d/c, has been taking it - a big improvement from prior.  Does have some night-time lows on 40 am and 40 pm.  Has been getting some meter readings in the 100s - again a big improvement. - reduce pm dose to 34 units - A1c next visit - should start ASA as he is able to obtain more meds - eye referral next visit (no more orange card referrals this month)

## 2011-09-06 LAB — BASIC METABOLIC PANEL WITH GFR
Calcium: 9.5 mg/dL (ref 8.4–10.5)
Chloride: 104 mEq/L (ref 96–112)
Creat: 1.43 mg/dL — ABNORMAL HIGH (ref 0.50–1.35)
GFR, Est Non African American: 58 mL/min — ABNORMAL LOW

## 2011-09-08 ENCOUNTER — Other Ambulatory Visit: Payer: Self-pay | Admitting: Internal Medicine

## 2011-09-15 ENCOUNTER — Encounter (HOSPITAL_BASED_OUTPATIENT_CLINIC_OR_DEPARTMENT_OTHER): Payer: Self-pay | Attending: General Surgery

## 2011-09-15 DIAGNOSIS — Z79899 Other long term (current) drug therapy: Secondary | ICD-10-CM | POA: Insufficient documentation

## 2011-09-15 DIAGNOSIS — I10 Essential (primary) hypertension: Secondary | ICD-10-CM | POA: Insufficient documentation

## 2011-09-15 DIAGNOSIS — E1169 Type 2 diabetes mellitus with other specified complication: Secondary | ICD-10-CM | POA: Insufficient documentation

## 2011-09-15 DIAGNOSIS — L97509 Non-pressure chronic ulcer of other part of unspecified foot with unspecified severity: Secondary | ICD-10-CM | POA: Insufficient documentation

## 2011-09-15 DIAGNOSIS — Z794 Long term (current) use of insulin: Secondary | ICD-10-CM | POA: Insufficient documentation

## 2011-09-15 DIAGNOSIS — S98119A Complete traumatic amputation of unspecified great toe, initial encounter: Secondary | ICD-10-CM | POA: Insufficient documentation

## 2011-09-22 ENCOUNTER — Encounter (HOSPITAL_BASED_OUTPATIENT_CLINIC_OR_DEPARTMENT_OTHER): Payer: Self-pay

## 2011-09-22 LAB — GLUCOSE, CAPILLARY: Glucose-Capillary: 190 mg/dL — ABNORMAL HIGH (ref 70–99)

## 2011-09-25 ENCOUNTER — Telehealth: Payer: Self-pay | Admitting: Licensed Clinical Social Worker

## 2011-09-25 NOTE — Telephone Encounter (Signed)
CSW received call from pt's spouse requesting bus pass for pt's doctor's appts.  CSW could hear Mr. Heindl during the phone call.  Spouse states they did meet with [employer] office but [place], [employer] could not pick them up at the house but could within the city limits.  Family also voiced issue with the $1.50 cost for transport.  Pt did receive pass for discount bus fare of $0.75 per ride.  CSW informed spouse, [facility] could provide 2 one ride pass for pt to attend appt.  Pt does not have appt scheduled in [facility], will have pt schedule is 6 wk follow up form his 5/28 [facility] appt. when leaving bus pass.  Encouraged spouse to utilized $0.75 pass, possibly asking family/friends for assistance.

## 2011-10-13 ENCOUNTER — Encounter (HOSPITAL_BASED_OUTPATIENT_CLINIC_OR_DEPARTMENT_OTHER): Payer: Medicaid Other | Attending: General Surgery

## 2011-10-13 DIAGNOSIS — L97509 Non-pressure chronic ulcer of other part of unspecified foot with unspecified severity: Secondary | ICD-10-CM | POA: Insufficient documentation

## 2011-10-13 DIAGNOSIS — Z794 Long term (current) use of insulin: Secondary | ICD-10-CM | POA: Insufficient documentation

## 2011-10-13 DIAGNOSIS — S91309A Unspecified open wound, unspecified foot, initial encounter: Secondary | ICD-10-CM | POA: Insufficient documentation

## 2011-10-13 DIAGNOSIS — E1169 Type 2 diabetes mellitus with other specified complication: Secondary | ICD-10-CM | POA: Insufficient documentation

## 2011-10-13 DIAGNOSIS — Y835 Amputation of limb(s) as the cause of abnormal reaction of the patient, or of later complication, without mention of misadventure at the time of the procedure: Secondary | ICD-10-CM | POA: Insufficient documentation

## 2011-10-13 DIAGNOSIS — Z79899 Other long term (current) drug therapy: Secondary | ICD-10-CM | POA: Insufficient documentation

## 2011-10-13 DIAGNOSIS — I10 Essential (primary) hypertension: Secondary | ICD-10-CM | POA: Insufficient documentation

## 2011-11-10 ENCOUNTER — Other Ambulatory Visit (HOSPITAL_BASED_OUTPATIENT_CLINIC_OR_DEPARTMENT_OTHER): Payer: Self-pay | Admitting: General Surgery

## 2011-11-10 ENCOUNTER — Encounter (HOSPITAL_BASED_OUTPATIENT_CLINIC_OR_DEPARTMENT_OTHER): Payer: Medicaid Other | Attending: General Surgery

## 2011-11-10 ENCOUNTER — Ambulatory Visit (HOSPITAL_COMMUNITY)
Admission: RE | Admit: 2011-11-10 | Discharge: 2011-11-10 | Disposition: A | Discharge status: Home / Self Care | Payer: Medicaid Other | Source: Ambulatory Visit | Attending: General Surgery | Admitting: General Surgery

## 2011-11-10 DIAGNOSIS — L97509 Non-pressure chronic ulcer of other part of unspecified foot with unspecified severity: Secondary | ICD-10-CM | POA: Insufficient documentation

## 2011-11-10 DIAGNOSIS — M7989 Other specified soft tissue disorders: Secondary | ICD-10-CM | POA: Insufficient documentation

## 2011-11-10 DIAGNOSIS — E1169 Type 2 diabetes mellitus with other specified complication: Secondary | ICD-10-CM | POA: Insufficient documentation

## 2011-11-10 DIAGNOSIS — Z79899 Other long term (current) drug therapy: Secondary | ICD-10-CM | POA: Insufficient documentation

## 2011-11-10 DIAGNOSIS — S91309A Unspecified open wound, unspecified foot, initial encounter: Secondary | ICD-10-CM | POA: Insufficient documentation

## 2011-11-10 DIAGNOSIS — I10 Essential (primary) hypertension: Secondary | ICD-10-CM | POA: Insufficient documentation

## 2011-11-10 DIAGNOSIS — T148XXA Other injury of unspecified body region, initial encounter: Secondary | ICD-10-CM

## 2011-11-10 DIAGNOSIS — Z794 Long term (current) use of insulin: Secondary | ICD-10-CM | POA: Insufficient documentation

## 2011-11-10 DIAGNOSIS — E119 Type 2 diabetes mellitus without complications: Secondary | ICD-10-CM | POA: Insufficient documentation

## 2011-11-10 DIAGNOSIS — Y835 Amputation of limb(s) as the cause of abnormal reaction of the patient, or of later complication, without mention of misadventure at the time of the procedure: Secondary | ICD-10-CM | POA: Insufficient documentation

## 2011-11-10 DIAGNOSIS — M25879 Other specified joint disorders, unspecified ankle and foot: Secondary | ICD-10-CM | POA: Insufficient documentation

## 2011-11-29 ENCOUNTER — Telehealth: Payer: Self-pay | Admitting: Licensed Clinical Social Worker

## 2011-11-29 NOTE — Telephone Encounter (Signed)
Pt's spouse called CSW requesting 31 day bus pass for pt to use for upcoming appt.  CSW informed spouse this worker does not have access to 31 day pass, but can send request to in-house CSW team.  Informed Mrs. Lozon this worker can provide one-way bus pass.  Pt is showing only one upcoming appt in [facility].  If pt unable to receive 31 day pass, CSW will provide bus fare to/from [facility] appt.

## 2011-12-03 ENCOUNTER — Encounter (HOSPITAL_COMMUNITY): Payer: Self-pay | Admitting: *Deleted

## 2011-12-03 DIAGNOSIS — M24376 Pathological dislocation of unspecified foot, not elsewhere classified: Secondary | ICD-10-CM | POA: Diagnosis present

## 2011-12-03 DIAGNOSIS — Z9119 Patient's noncompliance with other medical treatment and regimen: Secondary | ICD-10-CM

## 2011-12-03 DIAGNOSIS — I129 Hypertensive chronic kidney disease with stage 1 through stage 4 chronic kidney disease, or unspecified chronic kidney disease: Secondary | ICD-10-CM | POA: Diagnosis present

## 2011-12-03 DIAGNOSIS — Z7289 Other problems related to lifestyle: Secondary | ICD-10-CM

## 2011-12-03 DIAGNOSIS — M869 Osteomyelitis, unspecified: Secondary | ICD-10-CM | POA: Diagnosis present

## 2011-12-03 DIAGNOSIS — L97509 Non-pressure chronic ulcer of other part of unspecified foot with unspecified severity: Secondary | ICD-10-CM | POA: Diagnosis present

## 2011-12-03 DIAGNOSIS — Z794 Long term (current) use of insulin: Secondary | ICD-10-CM

## 2011-12-03 DIAGNOSIS — I798 Other disorders of arteries, arterioles and capillaries in diseases classified elsewhere: Secondary | ICD-10-CM | POA: Diagnosis present

## 2011-12-03 DIAGNOSIS — N182 Chronic kidney disease, stage 2 (mild): Secondary | ICD-10-CM | POA: Diagnosis present

## 2011-12-03 DIAGNOSIS — F329 Major depressive disorder, single episode, unspecified: Secondary | ICD-10-CM | POA: Diagnosis present

## 2011-12-03 DIAGNOSIS — F17200 Nicotine dependence, unspecified, uncomplicated: Secondary | ICD-10-CM | POA: Diagnosis present

## 2011-12-03 DIAGNOSIS — R300 Dysuria: Secondary | ICD-10-CM | POA: Diagnosis present

## 2011-12-03 DIAGNOSIS — Z79899 Other long term (current) drug therapy: Secondary | ICD-10-CM

## 2011-12-03 DIAGNOSIS — L989 Disorder of the skin and subcutaneous tissue, unspecified: Secondary | ICD-10-CM | POA: Diagnosis present

## 2011-12-03 DIAGNOSIS — E1165 Type 2 diabetes mellitus with hyperglycemia: Principal | ICD-10-CM | POA: Diagnosis present

## 2011-12-03 DIAGNOSIS — F3289 Other specified depressive episodes: Secondary | ICD-10-CM | POA: Diagnosis present

## 2011-12-03 DIAGNOSIS — IMO0002 Reserved for concepts with insufficient information to code with codable children: Principal | ICD-10-CM | POA: Diagnosis present

## 2011-12-03 DIAGNOSIS — Z91199 Patient's noncompliance with other medical treatment and regimen due to unspecified reason: Secondary | ICD-10-CM

## 2011-12-03 DIAGNOSIS — M8970 Major osseous defect, unspecified site: Secondary | ICD-10-CM | POA: Diagnosis present

## 2011-12-03 DIAGNOSIS — E1159 Type 2 diabetes mellitus with other circulatory complications: Secondary | ICD-10-CM | POA: Diagnosis present

## 2011-12-03 DIAGNOSIS — R000 Tachycardia, unspecified: Secondary | ICD-10-CM | POA: Diagnosis present

## 2011-12-03 DIAGNOSIS — M24373 Pathological dislocation of unspecified ankle, not elsewhere classified: Secondary | ICD-10-CM | POA: Diagnosis present

## 2011-12-03 DIAGNOSIS — G4700 Insomnia, unspecified: Secondary | ICD-10-CM | POA: Diagnosis present

## 2011-12-03 DIAGNOSIS — E785 Hyperlipidemia, unspecified: Secondary | ICD-10-CM | POA: Diagnosis present

## 2011-12-03 DIAGNOSIS — E1149 Type 2 diabetes mellitus with other diabetic neurological complication: Secondary | ICD-10-CM | POA: Diagnosis present

## 2011-12-03 LAB — CBC WITH DIFFERENTIAL/PLATELET
Basophils Relative: 0 % (ref 0–1)
Eosinophils Absolute: 0.3 10*3/uL (ref 0.0–0.7)
MCH: 29.7 pg (ref 26.0–34.0)
MCHC: 34 g/dL (ref 30.0–36.0)
Neutrophils Relative %: 68 % (ref 43–77)
Platelets: 417 10*3/uL — ABNORMAL HIGH (ref 150–400)
RDW: 12.6 % (ref 11.5–15.5)

## 2011-12-03 NOTE — ED Notes (Signed)
Pt states that his second toe on his left foot is infected. Pt has amputated great toe on left foot as well. 2 weeks ago pt had fourth toe on the foot removed and since has been going to wound care. Pt does have a laceration and seperation of flesh on second toe.

## 2011-12-03 NOTE — ED Notes (Addendum)
Updated patient and family on wait time; apologized about delay.  Patient verbalized understanding; patient denies any changes in condition; will continue to monitor.

## 2011-12-03 NOTE — ED Notes (Signed)
Updated patient on wait time; apologized to patient about delay.  Patient verbalized understanding; denies any changes in condition.  Will continue to monitor.

## 2011-12-04 ENCOUNTER — Encounter: Payer: Self-pay | Admitting: Licensed Clinical Social Worker

## 2011-12-04 ENCOUNTER — Emergency Department (HOSPITAL_COMMUNITY): Payer: Medicaid Other

## 2011-12-04 ENCOUNTER — Inpatient Hospital Stay (HOSPITAL_COMMUNITY): Payer: Medicaid Other

## 2011-12-04 ENCOUNTER — Inpatient Hospital Stay (HOSPITAL_COMMUNITY)
Admission: EM | Admit: 2011-12-04 | Discharge: 2011-12-08 | DRG: 617 | Payer: Medicaid Other | Attending: Internal Medicine | Admitting: Internal Medicine

## 2011-12-04 DIAGNOSIS — I10 Essential (primary) hypertension: Secondary | ICD-10-CM | POA: Diagnosis present

## 2011-12-04 DIAGNOSIS — L089 Local infection of the skin and subcutaneous tissue, unspecified: Secondary | ICD-10-CM

## 2011-12-04 DIAGNOSIS — M7989 Other specified soft tissue disorders: Secondary | ICD-10-CM

## 2011-12-04 DIAGNOSIS — E1351 Other specified diabetes mellitus with diabetic peripheral angiopathy without gangrene: Secondary | ICD-10-CM

## 2011-12-04 DIAGNOSIS — F329 Major depressive disorder, single episode, unspecified: Secondary | ICD-10-CM

## 2011-12-04 DIAGNOSIS — E11621 Type 2 diabetes mellitus with foot ulcer: Secondary | ICD-10-CM | POA: Diagnosis present

## 2011-12-04 DIAGNOSIS — I96 Gangrene, not elsewhere classified: Secondary | ICD-10-CM

## 2011-12-04 DIAGNOSIS — E119 Type 2 diabetes mellitus without complications: Secondary | ICD-10-CM

## 2011-12-04 DIAGNOSIS — E1165 Type 2 diabetes mellitus with hyperglycemia: Secondary | ICD-10-CM | POA: Diagnosis present

## 2011-12-04 DIAGNOSIS — N182 Chronic kidney disease, stage 2 (mild): Secondary | ICD-10-CM | POA: Diagnosis present

## 2011-12-04 HISTORY — DX: Depression, unspecified: F32.A

## 2011-12-04 HISTORY — DX: Peripheral vascular disease, unspecified: I73.9

## 2011-12-04 HISTORY — DX: Major depressive disorder, single episode, unspecified: F32.9

## 2011-12-04 HISTORY — DX: Gastro-esophageal reflux disease without esophagitis: K21.9

## 2011-12-04 LAB — HEPATIC FUNCTION PANEL
ALT: 9 U/L (ref 0–53)
Alkaline Phosphatase: 105 U/L (ref 39–117)
Total Bilirubin: 0.2 mg/dL — ABNORMAL LOW (ref 0.3–1.2)
Total Protein: 7.3 g/dL (ref 6.0–8.3)

## 2011-12-04 LAB — CBC
HCT: 31.9 % — ABNORMAL LOW (ref 39.0–52.0)
Hemoglobin: 10.9 g/dL — ABNORMAL LOW (ref 13.0–17.0)
MCHC: 34.2 g/dL (ref 30.0–36.0)
MCV: 86.4 fL (ref 78.0–100.0)
WBC: 10.7 10*3/uL — ABNORMAL HIGH (ref 4.0–10.5)

## 2011-12-04 LAB — BASIC METABOLIC PANEL
BUN: 18 mg/dL (ref 6–23)
BUN: 13 mg/dL (ref 6–23)
CO2: 22 mEq/L (ref 19–32)
Chloride: 103 mEq/L (ref 96–112)
GFR calc Af Amer: 71 mL/min — ABNORMAL LOW (ref >90)
GFR calc Af Amer: 80 mL/min — ABNORMAL LOW (ref >90)
GFR calc non Af Amer: 61 mL/min — ABNORMAL LOW (ref >90)
Potassium: 4.3 mEq/L (ref 3.5–5.1)
Potassium: 3.6 mEq/L (ref 3.5–5.1)
Sodium: 137 mEq/L (ref 135–145)

## 2011-12-04 LAB — GLUCOSE, CAPILLARY
Glucose-Capillary: 134 mg/dL — ABNORMAL HIGH (ref 70–99)
Glucose-Capillary: 135 mg/dL — ABNORMAL HIGH (ref 70–99)
Glucose-Capillary: 71 mg/dL (ref 70–99)

## 2011-12-04 LAB — HEMOGLOBIN A1C: Hgb A1c MFr Bld: 11.9 % — ABNORMAL HIGH (ref <5.7)

## 2011-12-04 LAB — PROTIME-INR: INR: 1.07 (ref 0.00–1.49)

## 2011-12-04 MED ORDER — CLOTRIMAZOLE 1 % EX CREA
TOPICAL_CREAM | Freq: Two times a day (BID) | CUTANEOUS | Status: DC
  Administered 2011-12-04 – 2011-12-08 (×7): via TOPICAL
  Filled 2011-12-04: qty 15

## 2011-12-04 MED ORDER — GABAPENTIN 300 MG PO CAPS: 600.0000 mg | ORAL_CAPSULE | Freq: Two times a day (BID) | ORAL | Status: DC

## 2011-12-04 MED ORDER — OXYCODONE-ACETAMINOPHEN 5-325 MG PO TABS
1.0000 | ORAL_TABLET | ORAL | Status: DC | PRN
  Administered 2011-12-04 – 2011-12-08 (×14): 1 via ORAL
  Filled 2011-12-04 (×14): qty 1

## 2011-12-04 MED ORDER — VANCOMYCIN HCL IN DEXTROSE 1-5 GM/200ML-% IV SOLN
1000.0000 mg | Freq: Two times a day (BID) | INTRAVENOUS | Status: DC
  Administered 2011-12-04 – 2011-12-06 (×4): 1000 mg via INTRAVENOUS
  Filled 2011-12-04 (×6): qty 200

## 2011-12-04 MED ORDER — HYDROCODONE-ACETAMINOPHEN 5-325 MG PO TABS
1.0000 | ORAL_TABLET | ORAL | Status: DC | PRN
  Administered 2011-12-04: 1 via ORAL
  Filled 2011-12-04: qty 1

## 2011-12-04 MED ORDER — HEPARIN SODIUM (PORCINE) 5000 UNIT/ML IJ SOLN
5000.0000 [IU] | Freq: Three times a day (TID) | INTRAMUSCULAR | Status: DC
  Administered 2011-12-04 – 2011-12-05 (×4): 5000 [IU] via SUBCUTANEOUS
  Filled 2011-12-04 (×7): qty 1

## 2011-12-04 MED ORDER — NICOTINE 21 MG/24HR TD PT24
21.0000 mg | MEDICATED_PATCH | Freq: Every day | TRANSDERMAL | Status: DC
  Administered 2011-12-04 – 2011-12-08 (×5): 21 mg via TRANSDERMAL
  Filled 2011-12-04 (×6): qty 1

## 2011-12-04 MED ORDER — GABAPENTIN 600 MG PO TABS
600.0000 mg | ORAL_TABLET | Freq: Two times a day (BID) | ORAL | Status: DC
  Administered 2011-12-04 – 2011-12-08 (×9): 600 mg via ORAL
  Filled 2011-12-04 (×10): qty 1

## 2011-12-04 MED ORDER — VANCOMYCIN HCL 1000 MG IV SOLR
1750.0000 mg | Freq: Once | INTRAVENOUS | Status: AC
  Administered 2011-12-04: 1750 mg via INTRAVENOUS
  Filled 2011-12-04: qty 1750

## 2011-12-04 MED ORDER — INSULIN ASPART 100 UNIT/ML ~~LOC~~ SOLN
0.0000 [IU] | Freq: Three times a day (TID) | SUBCUTANEOUS | Status: DC
  Administered 2011-12-04: 10:00:00 via SUBCUTANEOUS
  Administered 2011-12-04: 5 [IU] via SUBCUTANEOUS
  Administered 2011-12-05 – 2011-12-06 (×2): 11 [IU] via SUBCUTANEOUS
  Administered 2011-12-07 (×2): 8 [IU] via SUBCUTANEOUS
  Administered 2011-12-08: 3 [IU] via SUBCUTANEOUS

## 2011-12-04 MED ORDER — ACETAMINOPHEN 325 MG PO TABS
650.0000 mg | ORAL_TABLET | Freq: Four times a day (QID) | ORAL | Status: DC | PRN
  Administered 2011-12-04: 650 mg via ORAL
  Filled 2011-12-04: qty 2

## 2011-12-04 MED ORDER — GADOBENATE DIMEGLUMINE 529 MG/ML IV SOLN
18.0000 mL | Freq: Once | INTRAVENOUS | Status: AC
  Administered 2011-12-04: 18 mL via INTRAVENOUS

## 2011-12-04 MED ORDER — FLUOXETINE HCL 20 MG PO CAPS
20.0000 mg | ORAL_CAPSULE | Freq: Every day | ORAL | Status: DC
  Administered 2011-12-04 – 2011-12-08 (×5): 20 mg via ORAL
  Filled 2011-12-04 (×5): qty 1

## 2011-12-04 MED ORDER — HYDROCHLOROTHIAZIDE 25 MG PO TABS
25.0000 mg | ORAL_TABLET | Freq: Every day | ORAL | Status: DC
  Administered 2011-12-04 – 2011-12-08 (×5): 25 mg via ORAL
  Filled 2011-12-04 (×5): qty 1

## 2011-12-04 MED ORDER — PIPERACILLIN-TAZOBACTAM 3.375 G IVPB
3.3750 g | Freq: Three times a day (TID) | INTRAVENOUS | Status: DC
  Administered 2011-12-04 – 2011-12-06 (×7): 3.375 g via INTRAVENOUS
  Filled 2011-12-04 (×9): qty 50

## 2011-12-04 MED ORDER — FENTANYL CITRATE 0.05 MG/ML IJ SOLN
50.0000 ug | Freq: Once | INTRAMUSCULAR | Status: AC
  Administered 2011-12-04: 50 ug via INTRAVENOUS
  Filled 2011-12-04: qty 2

## 2011-12-04 MED ORDER — INSULIN NPH (HUMAN) (ISOPHANE) 100 UNIT/ML ~~LOC~~ SUSP
10.0000 [IU] | Freq: Two times a day (BID) | SUBCUTANEOUS | Status: DC
  Administered 2011-12-04 – 2011-12-05 (×2): 10 [IU] via SUBCUTANEOUS
  Filled 2011-12-04 (×2): qty 10

## 2011-12-04 MED ORDER — SODIUM CHLORIDE 0.9 % IV SOLN
INTRAVENOUS | Status: DC
  Administered 2011-12-04: 03:00:00 via INTRAVENOUS
  Administered 2011-12-04: 1000 mL via INTRAVENOUS

## 2011-12-04 MED ORDER — PIPERACILLIN-TAZOBACTAM 3.375 G IVPB
3.3750 g | Freq: Once | INTRAVENOUS | Status: AC
  Administered 2011-12-04: 3.375 g via INTRAVENOUS
  Filled 2011-12-04: qty 50

## 2011-12-04 MED ORDER — INSULIN ASPART 100 UNIT/ML ~~LOC~~ SOLN
0.0000 [IU] | Freq: Every day | SUBCUTANEOUS | Status: DC
  Administered 2011-12-04: 4 [IU] via SUBCUTANEOUS
  Administered 2011-12-05: 3 [IU] via SUBCUTANEOUS
  Administered 2011-12-06: 5 [IU] via SUBCUTANEOUS

## 2011-12-04 MED ORDER — PANTOPRAZOLE SODIUM 40 MG PO TBEC
80.0000 mg | DELAYED_RELEASE_TABLET | Freq: Every day | ORAL | Status: DC
Start: 2011-12-04 — End: 2011-12-08
  Administered 2011-12-04 – 2011-12-08 (×5): 80 mg via ORAL
  Filled 2011-12-04: qty 1
  Filled 2011-12-04 (×2): qty 2
  Filled 2011-12-04: qty 1
  Filled 2011-12-04: qty 2
  Filled 2011-12-04 (×2): qty 1

## 2011-12-04 MED ORDER — ACETAMINOPHEN 650 MG RE SUPP: 650.0000 mg | Freq: Four times a day (QID) | RECTAL | Status: DC | PRN

## 2011-12-04 MED ORDER — TRAZODONE HCL 50 MG PO TABS
50.0000 mg | ORAL_TABLET | Freq: Every evening | ORAL | Status: DC | PRN
  Administered 2011-12-04 – 2011-12-06 (×2): 50 mg via ORAL
  Filled 2011-12-04 (×3): qty 1

## 2011-12-04 NOTE — ED Notes (Signed)
Wet sterile 4x4's applied to foot.

## 2011-12-04 NOTE — Progress Notes (Signed)
ANTIBIOTIC CONSULT NOTE - INITIAL  Pharmacy Consult for vancomycin Indication: possible osteomyelitis  No Known Allergies  Patient Measurements:    Vital Signs: Temp: 99.1 F (37.3 C) (08/26 0015) Temp src: Oral (08/26 0015) BP: 133/102 mmHg (08/26 0032) Pulse Rate: 102  (08/26 0032) Intake/Output from previous day:   Intake/Output from this shift:    Labs:  [facility] 12/03/11 2307  WBC 12.8*  HGB 12.1*  PLT 417*  LABCREA --  CREATININE 1.36*   The CrCl is unknown because both a height and weight (above a minimum accepted value) are required for this calculation. No results found for this basename: VANCOTROUGH:2,VANCOPEAK:2,VANCORANDOM:2,GENTTROUGH:2,GENTPEAK:2,GENTRANDOM:2,TOBRATROUGH:2,TOBRAPEAK:2,TOBRARND:2,AMIKACINPEAK:2,AMIKACINTROU:2,AMIKACIN:2, in the last 72 hours   Microbiology: No results found for this or any previous visit (from the past 720 hour(s)).  Medical History: Past Medical History  Diagnosis Date  . Onychomycosis   . Hypertension   . Diabetic peripheral neuropathy   . Diabetic foot ulcer 12/09    left plantar area  . Diabetes mellitus   . History of drug abuse     Cocaine and marijuana  . Tobacco abuse   . Exposure to trichomonas     treated empirically  . Cellulitis of left foot 03/2008    left 4th and 5th metatarsal area  . Hyperlipidemia   . 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 Ulcerative esophagitis 2007    severe, complicated with UGI bleed  . Mallory Janann August tear April 2009  . History of prolonged Q-T interval on ECG   . 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  . 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 Chronic renal insufficiency   . Depression     Medications:  Scheduled:    . fentaNYL  50 mcg Intravenous Once  . piperacillin-tazobactam (ZOSYN)  IV  3.375 g Intravenous Once   Assessment: 46 yo male presents with possible osteomyelitis. Pharmacy  consulted to manage vancomycin.   Goal of Therapy:  Vancomycin trough level 15-20 mcg/ml  Plan:  1. Vancomycin 1750mg  IV x 1, then 1gm IV Q12H.   Emeline Gins 12/04/2011,1:49 AM

## 2011-12-04 NOTE — Progress Notes (Signed)
ANTIBIOTIC CONSULT NOTE - FOLLOW UP  Pharmacy Consult for zosyn, add to vancomycin Indication: osteomyelitis of 2nd left toe  No Known Allergies  Patient Measurements: Height: 5\' 8"  (172.7 cm) Weight: 206 lb 5.6 oz (93.6 kg) IBW/kg (Calculated) : 68.4  Vital Signs:  Vital Signs: Temp: 97.9 F (36.6 C) (08/26 1300) Temp src: Oral (08/26 1300) BP: 149/88 mmHg (08/26 1300) Pulse Rate: 89  (08/26 1300) Intake/Output from previous day:   Intake/Output from this shift: Total I/O In: -  Out: 350 [Urine:350]  Labs:  [facility] 12/04/11 0831 12/03/11 2307  WBC 10.7* 12.8*  HGB 10.9* 12.1*  PLT 394 417*  LABCREA -- --  CREATININE 1.23 1.36*   Estimated Creatinine Clearance: 83.3 ml/min (by C-G formula based on Cr of 1.23). No results found for this basename: VANCOTROUGH:2,VANCOPEAK:2,VANCORANDOM:2,GENTTROUGH:2,GENTPEAK:2,GENTRANDOM:2,TOBRATROUGH:2,TOBRAPEAK:2,TOBRARND:2,AMIKACINPEAK:2,AMIKACINTROU:2,AMIKACIN:2, in the last 72 hours   Microbiology: Recent Results (from the past 720 hour(s))  MRSA PCR SCREENING     Status: Normal   Collection Time   12/04/11  6:03 AM      Component Value Range Status Comment   MRSA by PCR NEGATIVE  NEGATIVE Final     Assessment: To add zosyn to vancomycin for Left 2nd toe osteo by MRI.  PMH of CRI.  Creat cl > 30 ml/min.    Plan:  Zosyn 3.375 gm IV q8hr, infuse each dose over 4 hours Continue vancomycin 1000 mg IV q12h F/u renal function and culture data Herby Abraham, Pharm.D. [phone] 12/04/2011 2:24 PM

## 2011-12-04 NOTE — ED Notes (Signed)
Admitting MD request blood cultures be drawn before antibiotics started

## 2011-12-04 NOTE — Telephone Encounter (Signed)
Pt currently admitted.  In-house CSW able to provide 31 day pass and will provide at d/c if appropriate.

## 2011-12-04 NOTE — Progress Notes (Signed)
VASCULAR LAB PRELIMINARY  PRELIMINARY  PRELIMINARY  PRELIMINARY  Carotid Dopplers completed.    Preliminary report:  There is no DVT or SVT noted.  Enlarged lymph node noted in the left groin.  Ona Roehrs, 12/04/2011, 10:03 AM

## 2011-12-04 NOTE — ED Provider Notes (Signed)
History     CSN: [id number]  Arrival date & time 12/03/11  1951   First MD Initiated Contact with Patient 12/04/11 0036      Chief Complaint  Patient presents with  . Toe Pain    (Consider location/radiation/quality/duration/timing/severity/associated sxs/prior treatment) HPI This is a 46 year old black male with a history of diabetes. He has lost several toes surgically due to necrosis. He is here with necrosis of his left second toe. It began as a wound over the proximal interphalangeal joint which is now eroded deep into the tissue. The toe is also edematous. There is mild to moderate pain associated with it, worse with movement. He has decreased sensation chronically in his feet due to diabetic neuropathy. He has not had fever with it.  Past Medical History  Diagnosis Date  . Onychomycosis   . Hypertension   . Diabetic peripheral neuropathy   . Diabetic foot ulcer 12/09    left plantar area  . Diabetes mellitus   . History of drug abuse     Cocaine and marijuana  . Tobacco abuse   . Exposure to trichomonas     treated empirically  . Cellulitis of left foot 03/2008    left 4th and 5th metatarsal area  . Hyperlipidemia   . 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 Ulcerative esophagitis 2007    severe, complicated with UGI bleed  . Mallory Janann August tear April 2009  . History of prolonged Q-T interval on ECG   . 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  . 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 Chronic renal insufficiency   . Depression     Past Surgical History  Procedure Date  . Nephrectomy 7/10    left  . Toe amputation   . Nephrectomy     R side  . I&d extremity 08/24/2011    Procedure: IRRIGATION AND DEBRIDEMENT EXTREMITY;  Surgeon: Cammy Copa, MD;  Location: [facility] OR;  Service: Orthopedics;  Laterality: Left;  . Amputation 08/24/2011    Procedure: AMPUTATION RAY;  Surgeon: Cammy Copa, MD;  Location: [facility] OR;  Service: Orthopedics;  Laterality: Left;  left fourth toe Ray Resection    History reviewed. No pertinent family history.  History  Substance Use Topics  . Smoking status: Current Everyday Smoker -- 0.5 packs/day    Types: Cigarettes    Last Attempt to Quit: 09/05/2011  . Smokeless tobacco: Not on file   Comment: Has stopped 2 weeks ago- QUIT line info given to pt and wife for asst with smoking patchers, etc  . Alcohol Use: No      Review of Systems  All other systems reviewed and are negative.    Allergies  Review of patient's allergies indicates no known allergies.  Home Medications   Current Outpatient Rx  Name Route Sig Dispense Refill  . FLUOXETINE HCL 20 MG PO CAPS Oral Take 1 capsule (20 mg total) by mouth daily. 30 capsule 3  . GABAPENTIN 300 MG PO CAPS Oral Take 2 capsules (600 mg total) by mouth 2 (two) times daily. 60 capsule 3  . HYDROCODONE-ACETAMINOPHEN 5-500 MG PO TABS Oral Take 1 tablet by mouth every 6 (six) hours as needed for pain. 10 tablet 0  . INSULIN ASPART PROT & ASPART (70-30) 100 UNIT/ML [place] SUSP  Subcutaneously inject 40 units in the morning and 34 units before the evening meal 10 mL 6  .  OMEPRAZOLE 40 MG PO CPDR Oral Take 1 capsule (40 mg total) by mouth daily. You can find this medication in generic form available over-the-counter. 30 capsule 3  . QUINAPRIL-HYDROCHLOROTHIAZIDE 20-12.5 MG PO TABS Oral Take 2 tablets by mouth daily. 60 tablet 6    BP 133/102  Pulse 102  Temp 99.1 F (37.3 C) (Oral)  Resp 16  SpO2 100%  Physical Exam General: Well-developed, well-nourished male in no acute distress; appearance consistent with age of record HENT: normocephalic, atraumatic Eyes: pupils equal round and reactive to light; extraocular muscles intact Neck: supple Heart: regular rate and rhythm Lungs: clear to auscultation bilaterally Abdomen: soft; nondistended Extremities: Surgical absence of the great toes  bilaterally and the fourth toe on the left foot; decreased range of motion of remaining toes of the right foot; open wound of the dorsal left second toe with edema and necrotic tissue, mildly tender to palpation or movement; laceration of skin overlying the amputation sites of the great toes bilaterally Neurologic: Awake, alert and oriented; motor function intact in all extremities and symmetric; no facial droop Skin: Warm and dry Psychiatric: Normal mood and affect    ED Course  Procedures (including critical care time)     MDM   Nursing notes and vitals signs, including pulse oximetry, reviewed.  Summary of this visit's results, reviewed by myself:  Labs:  Results for orders placed during the hospital encounter of 12/04/11  CBC WITH DIFFERENTIAL      Component Value Range   WBC 12.8 (*) 4.0 - 10.5 K/uL   RBC 4.08 (*) 4.22 - 5.81 MIL/uL   Hemoglobin 12.1 (*) 13.0 - 17.0 g/dL   HCT 16.1 (*) 09.6 - 04.5 %   MCV 87.3  78.0 - 100.0 fL   MCH 29.7  26.0 - 34.0 pg   MCHC 34.0  30.0 - 36.0 g/dL   RDW 40.9  81.1 - 91.4 %   Platelets 417 (*) 150 - 400 K/uL   Neutrophils Relative 68  43 - 77 %   Neutro Abs 8.7 (*) 1.7 - 7.7 K/uL   Lymphocytes Relative 22  12 - 46 %   Lymphs Abs 2.8  0.7 - 4.0 K/uL   Monocytes Relative 8  3 - 12 %   Monocytes Absolute 1.0  0.1 - 1.0 K/uL   Eosinophils Relative 2  0 - 5 %   Eosinophils Absolute 0.3  0.0 - 0.7 K/uL   Basophils Relative 0  0 - 1 %   Basophils Absolute 0.0  0.0 - 0.1 K/uL  BASIC METABOLIC PANEL      Component Value Range   Sodium 137  135 - 145 mEq/L   Potassium 4.3  3.5 - 5.1 mEq/L   Chloride 104  96 - 112 mEq/L   CO2 24  19 - 32 mEq/L   Glucose, Bld 218 (*) 70 - 99 mg/dL   BUN 18  6 - 23 mg/dL   Creatinine, Ser 7.82 (*) 0.50 - 1.35 mg/dL   Calcium 9.2  8.4 - 95.6 mg/dL   GFR calc non Af Amer 61 (*) >90 mL/min   GFR calc Af Amer 71 (*) >90 mL/min    Imaging Studies: Dg Toe 2nd Left  12/04/2011  *RADIOLOGY REPORT*  Clinical  Data: Toe pain, necrosis, diabetes.  LEFT SECOND TOE  Comparison: 11/10/2011  Findings: Dislocation and medial subluxation of the second PIP joint.  Status post amputation of the distal aspect of the distal phalanx second digit.  Status post amputation of the phalanges of the first digit.  Cortical irregularity of the distal aspect of the metatarsals, similar to prior.  IMPRESSION: There appears be interval dislocation at the second PIP joint.  Otherwise, areas of cortical irregularity and soft tissue swelling are similar to recent prior.  Underlying septic arthritis or osteomyelitis not excluded.   Original Report Authenticated By: Waneta Martins, M.D.             Hanley Seamen, MD 12/04/11 [phone]

## 2011-12-04 NOTE — H&P (Signed)
Date: 12/04/2011               Patient Name:  [name] MRN: [id number]  DOB: 09-14-1965 Age / Sex: 46 y.o., male   PCP: Dow Adolph              Medical Service: Internal Medicine Teaching Service              Attending Physician: Dr. Blanch Media    First Contact: Dr. Denton Ar Pager: [phone]  Second Contact: Dr. Elyse Jarvis Pager: [phone]            After Hours (After 5p/  First Contact Pager: [phone]  weekends / holidays): Second Contact Pager: [phone]     Chief Complaint: Left 2nd toe pain/discharge  History of Present Illness: Patient is a 46 y.o. male with a PMHx of Type 2 DM w/ multiple toe amputations, who presents to [facility] for evaluation of left second toe pain. He states the pain began three days prior to admission, but became worse on the day of admission. He states he has also noticed a malodorous, pink discharge during that time, as well as swelling of the left leg. The patient states he has had multiple previous toe amputations on both feet, which have been secondary to his poorly controlled diabetes. He complains of numbness in both feet which is secondary to his diabetic neuropathy and is unchanged.  Mr. Oelke reveals that he has not been taking many of his medications regularly, including his novolog, which he states he cannot afford to inject as frequently as prescribed. He also states his lack of funds prevent him from being able to afford methods to assess his blood sugars as frequently as described, but admits he has checked them over the last few days and that some have been too high to be read (>600, per patient). He states he has just received disability for his peripheral neuropathy and associated toe amputations, which he will begin to be compensated for in 02/2012. He states that until he receives this, he expects to continue to have difficulty affording his medications.  Review of Systems: Constitutional:  admits to fever  Respiratory:  denies SOB.  Cardiovascular: denies chest pain.  Gastrointestinal: denies nausea, vomiting, abdominal pain, diarrhea  Genitourinary: denies dysuria.  Psychiatric/ Behavioral: admits to depression.    Current Outpatient Medications: Medication Sig  . FLUoxetine (PROZAC) 20 MG capsule Take 1 capsule (20 mg total) by mouth daily.  Marland Kitchen gabapentin (NEURONTIN) 300 MG capsule Take 2 capsules (600 mg total) by mouth 2 (two) times daily.  Marland Kitchen HYDROcodone-acetaminophen (VICODIN) 5-500 MG per tablet Take 1 tablet by mouth every 6 (six) hours as needed for pain.  Marland Kitchen insulin aspart protamine-insulin aspart (NOVOLOG MIX 70/30) (70-30) 100 UNIT/ML injection Subcutaneously inject 40 units in the morning and 34 units before the evening meal  . omeprazole (PRILOSEC) 40 MG capsule Take 1 capsule (40 mg total) by mouth daily. You can find this medication in generic form available over-the-counter.  . quinapril-hydrochlorothiazide (ACCURETIC) 20-12.5 MG per tablet Take 2 tablets by mouth daily.    Allergies: No Known Allergies   Past Medical History: Past Medical History  Diagnosis Date  . Onychomycosis   . Hypertension   . Diabetic peripheral neuropathy   . Diabetic foot ulcer 12/09    left plantar area  . Diabetes mellitus   . History of drug abuse     Cocaine and marijuana  . Tobacco abuse   .  Exposure to trichomonas     treated empirically  . Cellulitis of left foot 03/2008    left 4th and 5th metatarsal area  . Hyperlipidemia   . 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 Ulcerative esophagitis 2007    severe, complicated with UGI bleed  . Mallory Janann August tear April 2009  . History of prolonged Q-T interval on ECG   . 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  . 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 Chronic renal insufficiency   . Depression     Past Surgical History: Past Surgical History  Procedure Date  .  Nephrectomy 7/10    left  . Toe amputation   . Nephrectomy     R side  . I&d extremity 08/24/2011    Procedure: IRRIGATION AND DEBRIDEMENT EXTREMITY;  Surgeon: Cammy Copa, MD;  Location: [facility] OR;  Service: Orthopedics;  Laterality: Left;  . Amputation 08/24/2011    Procedure: AMPUTATION RAY;  Surgeon: Cammy Copa, MD;  Location: [facility] OR;  Service: Orthopedics;  Laterality: Left;  left fourth toe Ray Resection    Family History: History reviewed. No pertinent family history.  Social History: History   Social History  . Marital Status: Married    Spouse Name: N/A    Number of Children: N/A  . Years of Education: N/A   Occupational History  . Not on file.   Social History Main Topics  . Smoking status: Current Everyday Smoker -- 0.5 packs/day    Types: Cigarettes    Last Attempt to Quit: 09/05/2011  . Smokeless tobacco: Not on file  . Alcohol Use: No  . Drug Use: Yes    Special: Marijuana  . Sexually Active: Not on file   Other Topics Concern  . Not on file   Social History Narrative  . No narrative on file   Vital Signs: Blood pressure 133/102, pulse 102, temperature 99.1 F (37.3 C), temperature source Oral, resp. rate 16, SpO2 100.00%.  Physical Exam: General: Vital signs reviewed and noted. Well-developed, well-nourished, in no acute distress; alert, appropriate and cooperative throughout examination.  Head: Normocephalic, atraumatic.  Eyes: PERRL, EOMI, No signs of anemia or jaundice.  Nose: Mucous membranes moist, not inflammed, nonerythematous.  Throat: Oropharynx nonerythematous, no exudate appreciated.   Neck: No deformities, masses, or tenderness noted.Supple, No carotid Bruits, no JVD.  Lungs:  Normal respiratory effort. Clear to auscultation BL without crackles or wheezes.  Heart: Tachycardic. Regular rhythm. S1 and S2 normal without gallop, murmur, or rubs.  Abdomen:  BS normoactive. Soft, Nondistended, non-tender.  No masses or organomegaly.    Extremities: Left great toe and L fourth toe surgically absent. Left 2nd toe edematous, with open ulcer on dorsal surface with serosanguinous discharge. 1+ LLE pitting edema, no edema in RLE. No erythema, increased warmth of LLE vs RLE. Decreased sensation bilaterally in LE.   Skin: Scaling skin in area of left axilla.   Lab results: Basic Metabolic Panel:  [facility] 12/03/11 2307  NA 137  K 4.3  CL 104  CO2 24  GLUCOSE 218*  BUN 18  CREATININE 1.36*  CALCIUM 9.2  MG --  PHOS --   CBC:  Basename 12/03/11 2307  WBC 12.8*  NEUTROABS 8.7*  HGB 12.1*  HCT 35.6*  MCV 87.3  PLT 417*   Imaging results:  Dg Toe 2nd Left  12/04/2011  *RADIOLOGY REPORT*  Clinical Data: Toe pain,  necrosis, diabetes.  LEFT SECOND TOE  Comparison: 11/10/2011  Findings: Dislocation and medial subluxation of the second PIP joint.  Status post amputation of the distal aspect of the distal phalanx second digit.  Status post amputation of the phalanges of the first digit.  Cortical irregularity of the distal aspect of the metatarsals, similar to prior.  IMPRESSION: There appears be interval dislocation at the second PIP joint.  Otherwise, areas of cortical irregularity and soft tissue swelling are similar to recent prior.  Underlying septic arthritis or osteomyelitis not excluded.   Original Report Authenticated By: Waneta Martins, M.D.    Assessment & Plan: Diabetic foot ulcer - Confined to left 2nd toe. Patient also has left leg edema, with associated diffuse tenderness to palpation if this extremity below the knee. WBC = 12.8. Afebrile. X-ray of 2nd left toe revealed interval dislocation (since 11/10/2011) of PIP joint. Differential diagnosis includes uninfected diabetic foot ulcer versus osteomyelitis. Asymmetric leg swelling with tenderness/pain likely would be explained by local infection/inflammation, but DVT is a concern as well, albeit much less likely given clinical presentation. Interventions at this  time will include investigating the underlying etiology of the patient's leg swelling and toe pain, treating empirically for osteomyelitis, and supportive care for patient's pain symptoms. - admit to floor - MRI left foot - blood cultures x2 - continue IV vanc/zosyn - LE doppler U/s - continue gabapentin - norco PRN - if MRI confirms osteomyelitis, proceed with surgery consult  Tobacco abuse - Current smoker, 1/2 packs per day. - nicotine patch  Pruritic lesion in left axilla - likely dermatophytosis. Scaling/flaking also present. - clotrimazole cream  Diabetes mellitus - poor compliance at home with insulin regimen (lantus 40U AM, 34U evening), with reported hypoglycemic episodes on past admissions. Will therefore exercise caution in glycemic control. - NPH 10U bid - SSI  Hypertension - on accuretic. Non-compliant, will hold on resuming dual therapy at this time. - HCTZ 25mg   Depression - Patient has been on prozac for two weeks, with no change in depressive symptoms. Patient aware medication may take 4-6 weeks to exert effects. Denies worsened depression. - continue prozac  DVT ppx - heparin    Signed: Elenor Legato, M.D. PGY-I, Internal Medicine Resident Pager: [phone] (7AM-5PM) 12/04/2011, 2:39 AM

## 2011-12-04 NOTE — H&P (Signed)
Internal Medicine Teaching Service Attending Note Date: 12/04/2011  Patient name: [name]  Medical record number: [id number]  Date of birth: 1965-09-03   I have seen and evaluated [name] and discussed their care with the Residency Team. Please see Dr MyTyre's H&P for full details. [name] is well known to me. He has poorly controlled DM, 2/2 mostly transportation issues. [place] 2 miles to the bus stop. He has recently been living in [place] with friends and relatives to help with transportation but wore out his welcome and had to return [place] the country. He has MAP assistance so affording meds is not an issue but getting to the pharmacy is and he been stretching out his insulin by taking subtherapuetic doses and ran out 2 days ago.   He has had several amputations of toes on his feet secondary to diabetic foot ulcers. He has been seen wound care every 2 weeks for the past month. All wounds have been healing except for a wean on his left second toe. This has not here and has worsened over the past month. It has gotten significantly gout over the past 2 days. There has been increased drainage, a bad odor, tightness, edema, and significant pain with weight bearing.   He has received disability that will start Nov 2013. He has until the end of August to apply for Medicaid and needs assistance with this. He also requests a 31 day bus pass to assist with transportation.  PMHX ; 1. Insulin dep DM II poorly controlled  DM neuropathy  Multiple DM foot infections and amputations 2. s/p Left laparoscopic nephrectomy 10/2008.  Poorly functioning left kidney.   Left ureteropelvic junction obstruction.   H/o of pyonephrosis, s/p perc drainage of 2 L of pus  3. Depression - recent dx  PMHx, meds, allergies, soc hx, and family hx were reviewed  Filed Vitals:   12/04/11 0328 12/04/11 0414 12/04/11 0619 12/04/11 1300  BP: 131/69 141/84  149/88  Pulse:  92 91  89  Temp: 98.3 F (36.8 C) 98.7 F (37.1 C)  97.9 F (36.6 C)  TempSrc: Oral Oral  Oral  Resp:  16  18  Height:   5\' 8"  (1.727 m)   Weight:   206 lb 5.6 oz (93.6 kg)   SpO2: 100% 99%  100%   GEN : lying in bed, NAD Left foot : malodorous ulceration of L 2nd toe. No warmth, no erythema. +2 DP pulse. + 1 edema pre-tibial L extremity.  L axilla - large circumferential scaling rash in axilla  Assessment and Plan: I agree with the formulated Assessment and Plan with the following changes:   1. L 2nd toe diabetic foot ulcer - Reviewed MRI findings that suggested osteo. Cont Vanc & zosyn. Ortho consult.  2. Poorly controlled DM II - he is on lower dose insulin here as pt states when he takes his full dose as an oupt, he does experience lows. Adjust insulin as inpt but needs intense outpt mgmt.  3. Unilateral LE edema - likely 2/2 infxn but Doppler was obtained that R/O DVT.  4. Depression - cont SSRI  5. Financial / social stressors that are affecting his health - Lynnae January has spoken to wife today. Inpt social work and case mgmt consult. [facility] consult.   Burns Spain, MD 8/26/20132:45 PM

## 2011-12-04 NOTE — Progress Notes (Signed)
This current visit is with pt's spouse, [name].  Mrs. Delph in to [facility] to inform CSW pt is currently admitted with the possibility of needing another toe amputated.  Mrs. Desouza concerned as to their d/c locations.  CSW informed Mrs. Slape, pt may need SNF upon d/c and inpt CSW will assist with seeking admission for pt only depending on his medical needs.  Wesley voiced pt had declined this in the past but would be open to going to any available family/couple shelter in [place] or [place].  Spouse inquired if [facility] [place] would be an option as they assist with those d/c from hospitals and also take couples.  CSW placed call and left message, [phone].  CSW informed inpt CSW, C. Mordecai Maes of conversation and concerns of spouse.  CSW provided spouse with C. Mordecai Maes contact information.  CSW will notify inpt CSW once receiving call back if [employer] [place] is an option.  This CSW informed Mrs. Pettitt that she will need to work with inpt CSW for appropriate d/c planning and this CSW can assist as needed.

## 2011-12-05 ENCOUNTER — Inpatient Hospital Stay (HOSPITAL_COMMUNITY): Payer: Medicaid Other | Admitting: Anesthesiology

## 2011-12-05 ENCOUNTER — Encounter (HOSPITAL_COMMUNITY): Payer: Self-pay | Admitting: General Practice

## 2011-12-05 ENCOUNTER — Encounter (HOSPITAL_COMMUNITY): Payer: Self-pay | Admitting: Anesthesiology

## 2011-12-05 ENCOUNTER — Encounter (HOSPITAL_COMMUNITY)
Admission: EM | Disposition: A | Discharge status: Other Acute Inpatient Hospital | Payer: Self-pay | Source: Home / Self Care | Attending: Internal Medicine

## 2011-12-05 DIAGNOSIS — L989 Disorder of the skin and subcutaneous tissue, unspecified: Secondary | ICD-10-CM

## 2011-12-05 DIAGNOSIS — E1169 Type 2 diabetes mellitus with other specified complication: Secondary | ICD-10-CM

## 2011-12-05 DIAGNOSIS — F17200 Nicotine dependence, unspecified, uncomplicated: Secondary | ICD-10-CM

## 2011-12-05 DIAGNOSIS — L97509 Non-pressure chronic ulcer of other part of unspecified foot with unspecified severity: Secondary | ICD-10-CM

## 2011-12-05 HISTORY — PX: AMPUTATION: SHX166

## 2011-12-05 LAB — GLUCOSE, CAPILLARY
Glucose-Capillary: 341 mg/dL — ABNORMAL HIGH (ref 70–99)
Glucose-Capillary: 251 mg/dL — ABNORMAL HIGH (ref 70–99)
Glucose-Capillary: 231 mg/dL — ABNORMAL HIGH (ref 70–99)
Glucose-Capillary: 296 mg/dL — ABNORMAL HIGH (ref 70–99)

## 2011-12-05 SURGERY — AMPUTATION, FOOT, RAY
Anesthesia: General | Site: Foot | Laterality: Left | Wound class: Dirty or Infected

## 2011-12-05 MED ORDER — ONDANSETRON HCL 4 MG/2ML IJ SOLN
INTRAMUSCULAR | Status: DC | PRN
  Administered 2011-12-05: 4 mg via INTRAVENOUS

## 2011-12-05 MED ORDER — INSULIN ASPART 100 UNIT/ML ~~LOC~~ SOLN
4.0000 [IU] | Freq: Once | SUBCUTANEOUS | Status: AC
  Administered 2011-12-05: 4 [IU] via SUBCUTANEOUS

## 2011-12-05 MED ORDER — PROPOFOL 10 MG/ML IV EMUL
INTRAVENOUS | Status: DC | PRN
  Administered 2011-12-05: 200 mg via INTRAVENOUS

## 2011-12-05 MED ORDER — SODIUM CHLORIDE 0.9 % IR SOLN
Status: DC | PRN
  Administered 2011-12-05: 1000 mL

## 2011-12-05 MED ORDER — HYDROMORPHONE HCL PF 1 MG/ML IJ SOLN
0.2500 mg | INTRAMUSCULAR | Status: DC | PRN
  Administered 2011-12-05: 0.5 mg via INTRAVENOUS

## 2011-12-05 MED ORDER — INSULIN NPH (HUMAN) (ISOPHANE) 100 UNIT/ML ~~LOC~~ SUSP
10.0000 [IU] | Freq: Two times a day (BID) | SUBCUTANEOUS | Status: DC
  Administered 2011-12-06: 10 [IU] via SUBCUTANEOUS
  Filled 2011-12-05: qty 10

## 2011-12-05 MED ORDER — LACTATED RINGERS IV SOLN
INTRAVENOUS | Status: DC
  Administered 2011-12-05 (×3): via INTRAVENOUS

## 2011-12-05 MED ORDER — LIDOCAINE HCL (CARDIAC) 20 MG/ML IV SOLN
INTRAVENOUS | Status: DC | PRN
  Administered 2011-12-05: 80 mg via INTRAVENOUS

## 2011-12-05 MED ORDER — PHENYLEPHRINE HCL 10 MG/ML IJ SOLN
INTRAMUSCULAR | Status: DC | PRN
  Administered 2011-12-05 (×4): 80 ug via INTRAVENOUS

## 2011-12-05 MED ORDER — HYDROMORPHONE HCL PF 1 MG/ML IJ SOLN
INTRAMUSCULAR | Status: AC
  Filled 2011-12-05: qty 1

## 2011-12-05 MED ORDER — METOCLOPRAMIDE HCL 5 MG/ML IJ SOLN: 5.0000 mg | Freq: Three times a day (TID) | INTRAMUSCULAR | Status: DC | PRN

## 2011-12-05 MED ORDER — HEPARIN SODIUM (PORCINE) 5000 UNIT/ML IJ SOLN
5000.0000 [IU] | Freq: Two times a day (BID) | INTRAMUSCULAR | Status: DC
  Administered 2011-12-06 – 2011-12-08 (×5): 5000 [IU] via SUBCUTANEOUS
  Filled 2011-12-05 (×7): qty 1

## 2011-12-05 MED ORDER — INSULIN NPH (HUMAN) (ISOPHANE) 100 UNIT/ML ~~LOC~~ SUSP: 20.0000 [IU] | Freq: Two times a day (BID) | SUBCUTANEOUS | Status: DC

## 2011-12-05 MED ORDER — MIDAZOLAM HCL 5 MG/5ML IJ SOLN
INTRAMUSCULAR | Status: DC | PRN
  Administered 2011-12-05: 2 mg via INTRAVENOUS

## 2011-12-05 MED ORDER — METOCLOPRAMIDE HCL 10 MG PO TABS: 5.0000 mg | ORAL_TABLET | Freq: Three times a day (TID) | ORAL | Status: DC | PRN

## 2011-12-05 MED ORDER — ONDANSETRON HCL 4 MG PO TABS: 4.0000 mg | ORAL_TABLET | Freq: Four times a day (QID) | ORAL | Status: DC | PRN

## 2011-12-05 MED ORDER — ONDANSETRON HCL 4 MG/2ML IJ SOLN: 4.0000 mg | Freq: Four times a day (QID) | INTRAMUSCULAR | Status: DC | PRN

## 2011-12-05 MED ORDER — ONDANSETRON HCL 4 MG/2ML IJ SOLN: 4.0000 mg | Freq: Once | INTRAMUSCULAR | Status: DC | PRN

## 2011-12-05 MED ORDER — FENTANYL CITRATE 0.05 MG/ML IJ SOLN
INTRAMUSCULAR | Status: DC | PRN
  Administered 2011-12-05: 50 ug via INTRAVENOUS

## 2011-12-05 SURGICAL SUPPLY — 45 items
BANDAGE ELASTIC 4 VELCRO ST LF (GAUZE/BANDAGES/DRESSINGS) ×1 IMPLANT
BLADE AVERAGE 25X9 (BLADE) IMPLANT
BLADE SURG 10 STRL SS (BLADE) IMPLANT
BNDG CMPR 9X4 STRL LF SNTH (GAUZE/BANDAGES/DRESSINGS) ×1
BNDG COHESIVE 4X5 TAN STRL (GAUZE/BANDAGES/DRESSINGS) ×2 IMPLANT
BNDG ESMARK 4X9 LF (GAUZE/BANDAGES/DRESSINGS) ×2 IMPLANT
CLOTH BEACON ORANGE TIMEOUT ST (SAFETY) ×2 IMPLANT
COVER MAYO STAND STRL (DRAPES) ×2 IMPLANT
CUFF TOURNIQUET SINGLE 34IN LL (TOURNIQUET CUFF) IMPLANT
CUFF TOURNIQUET SINGLE 44IN (TOURNIQUET CUFF) IMPLANT
DRAPE SURG 17X23 STRL (DRAPES) ×2 IMPLANT
DRAPE U-SHAPE 47X51 STRL (DRAPES) ×2 IMPLANT
DRSG ADAPTIC 3X8 NADH LF (GAUZE/BANDAGES/DRESSINGS) ×2 IMPLANT
DRSG PAD ABDOMINAL 8X10 ST (GAUZE/BANDAGES/DRESSINGS) ×1 IMPLANT
DURAPREP 26ML APPLICATOR (WOUND CARE) ×2 IMPLANT
ELECT REM PT RETURN 9FT ADLT (ELECTROSURGICAL) ×2
ELECTRODE REM PT RTRN 9FT ADLT (ELECTROSURGICAL) ×1 IMPLANT
GAUZE SPONGE 4X4 16PLY XRAY LF (GAUZE/BANDAGES/DRESSINGS) ×2 IMPLANT
GAUZE XEROFORM 1X8 LF (GAUZE/BANDAGES/DRESSINGS) ×1 IMPLANT
GLOVE BIOGEL PI IND STRL 8 (GLOVE) ×1 IMPLANT
GLOVE BIOGEL PI INDICATOR 8 (GLOVE) ×1
GLOVE SURG ORTHO 8.0 STRL STRW (GLOVE) ×2 IMPLANT
GOWN PREVENTION PLUS LG XLONG (DISPOSABLE) IMPLANT
GOWN PREVENTION PLUS XLARGE (GOWN DISPOSABLE) ×2 IMPLANT
GOWN STRL NON-REIN LRG LVL3 (GOWN DISPOSABLE) ×4 IMPLANT
HANDPIECE INTERPULSE COAX TIP (DISPOSABLE)
KIT BASIN OR (CUSTOM PROCEDURE TRAY) ×2 IMPLANT
KIT ROOM TURNOVER OR (KITS) ×2 IMPLANT
MANIFOLD NEPTUNE II (INSTRUMENTS) ×2 IMPLANT
NS IRRIG 1000ML POUR BTL (IV SOLUTION) ×2 IMPLANT
PACK ORTHO EXTREMITY (CUSTOM PROCEDURE TRAY) ×2 IMPLANT
PAD ARMBOARD 7.5X6 YLW CONV (MISCELLANEOUS) ×4 IMPLANT
PAD CAST 4YDX4 CTTN HI CHSV (CAST SUPPLIES) ×1 IMPLANT
PADDING CAST COTTON 4X4 STRL (CAST SUPPLIES) ×2
SET HNDPC FAN SPRY TIP SCT (DISPOSABLE) IMPLANT
SPONGE GAUZE 4X4 12PLY (GAUZE/BANDAGES/DRESSINGS) ×2 IMPLANT
SPONGE LAP 18X18 X RAY DECT (DISPOSABLE) ×2 IMPLANT
SUCTION FRAZIER TIP 10 FR DISP (SUCTIONS) ×2 IMPLANT
SUT ETHILON 3 0 PS 1 (SUTURE) ×2 IMPLANT
SYR CONTROL 10ML LL (SYRINGE) IMPLANT
TOWEL OR 17X24 6PK STRL BLUE (TOWEL DISPOSABLE) ×2 IMPLANT
TOWEL OR 17X26 10 PK STRL BLUE (TOWEL DISPOSABLE) ×2 IMPLANT
TUBE CONNECTING 12X1/4 (SUCTIONS) ×2 IMPLANT
WATER STERILE IRR 1000ML POUR (IV SOLUTION) ×2 IMPLANT
YANKAUER SUCT BULB TIP NO VENT (SUCTIONS) ×2 IMPLANT

## 2011-12-05 NOTE — Consult Note (Signed)
Reason for Consult: Left foot infection Referring Physician: Dr. Marlena Clipper is an 46 y.o. male.  HPI: [name] is a 46 year old patient with diabetes reports several week history of left foot and toe pain drainage and infection. He is admitted to the hospital service for treatment of infection. He reports drainage but denies any pain associated with the toe infection. Notably he has had previous fourth and first toe amputation in the past. Does have healed that he has been able to continue to ambulate. He denies any fever or chills. He is currently on IV antibiotics.  Past Medical History  Diagnosis Date  . Onychomycosis   . Hypertension   . Diabetic peripheral neuropathy   . Diabetic foot ulcer 12/09    left plantar area  . History of drug abuse     Cocaine and marijuana  . Exposure to trichomonas     treated empirically  . Cellulitis of left foot 03/2008    left 4th and 5th metatarsal area  . Hyperlipidemia   . 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 Ulcerative esophagitis 2007    severe, complicated with UGI bleed  . Mallory Janann August tear April 2009  . History of prolonged Q-T interval on ECG   . 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  . 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 Chronic renal insufficiency   . Depression   . Peripheral vascular disease   . Type II diabetes mellitus   . GERD (gastroesophageal reflux disease)     Past Surgical History  Procedure Date  . Toe amputation 06/2011    left; 4th toe  . Nephrectomy     R side  . I&d extremity 08/24/2011    Procedure: IRRIGATION AND DEBRIDEMENT EXTREMITY;  Surgeon: Cammy Copa, MD;  Location: [facility] OR;  Service: Orthopedics;  Laterality: Left;  . Amputation 08/24/2011    Procedure: AMPUTATION RAY;  Surgeon: Cammy Copa, MD;  Location: [facility] OR;  Service: Orthopedics;  Laterality: Left;  left fourth toe Ray Resection     History reviewed. No pertinent family history.  Social History:  reports that he has been smoking Cigarettes.  He has a 17 pack-year smoking history. He has never used smokeless tobacco. He reports that he drinks alcohol. He reports that he uses illicit drugs (Marijuana).  Allergies: No Known Allergies  Medications: I have reviewed the patient's current medications.  Results for orders placed during the hospital encounter of 12/04/11 (from the past 48 hour(s))  CBC WITH DIFFERENTIAL     Status: Abnormal   Collection Time   12/03/11 11:07 PM      Component Value Range Comment   WBC 12.8 (*) 4.0 - 10.5 K/uL    RBC 4.08 (*) 4.22 - 5.81 MIL/uL    Hemoglobin 12.1 (*) 13.0 - 17.0 g/dL    HCT 16.1 (*) 09.6 - 52.0 %    MCV 87.3  78.0 - 100.0 fL    MCH 29.7  26.0 - 34.0 pg    MCHC 34.0  30.0 - 36.0 g/dL    RDW 04.5  40.9 - 81.1 %    Platelets 417 (*) 150 - 400 K/uL    Neutrophils Relative 68  43 - 77 %    Neutro Abs 8.7 (*) 1.7 - 7.7 K/uL    Lymphocytes Relative 22  12 - 46 %    Lymphs Abs 2.8  0.7 -  4.0 K/uL    Monocytes Relative 8  3 - 12 %    Monocytes Absolute 1.0  0.1 - 1.0 K/uL    Eosinophils Relative 2  0 - 5 %    Eosinophils Absolute 0.3  0.0 - 0.7 K/uL    Basophils Relative 0  0 - 1 %    Basophils Absolute 0.0  0.0 - 0.1 K/uL   BASIC METABOLIC PANEL     Status: Abnormal   Collection Time   12/03/11 11:07 PM      Component Value Range Comment   Sodium 137  135 - 145 mEq/L    Potassium 4.3  3.5 - 5.1 mEq/L    Chloride 104  96 - 112 mEq/L    CO2 24  19 - 32 mEq/L    Glucose, Bld 218 (*) 70 - 99 mg/dL    BUN 18  6 - 23 mg/dL    Creatinine, Ser 4.09 (*) 0.50 - 1.35 mg/dL    Calcium 9.2  8.4 - 81.1 mg/dL    GFR calc non Af Amer 61 (*) >90 mL/min    GFR calc Af Amer 71 (*) >90 mL/min   CULTURE, BLOOD (ROUTINE X 2)     Status: Normal (Preliminary result)   Collection Time   12/04/11  2:40 AM      Component Value Range Comment   Specimen Description BLOOD RIGHT HAND       Special Requests BOTTLES DRAWN AEROBIC ONLY 10CC      Culture  Setup Time 12/04/2011 08:46      Culture        Value:        BLOOD CULTURE RECEIVED NO GROWTH TO DATE CULTURE WILL BE HELD FOR 5 DAYS BEFORE ISSUING A FINAL NEGATIVE REPORT   Report Status PENDING     HEPATIC FUNCTION PANEL     Status: Abnormal   Collection Time   12/04/11  2:46 AM      Component Value Range Comment   Total Protein 7.3  6.0 - 8.3 g/dL    Albumin 3.0 (*) 3.5 - 5.2 g/dL    AST 15  0 - 37 U/L    ALT 9  0 - 53 U/L    Alkaline Phosphatase 105  39 - 117 U/L    Total Bilirubin 0.2 (*) 0.3 - 1.2 mg/dL    Bilirubin, Direct <9.1  0.0 - 0.3 mg/dL    Indirect Bilirubin NOT CALCULATED  0.3 - 0.9 mg/dL   HEMOGLOBIN Y7W     Status: Abnormal   Collection Time   12/04/11  2:46 AM      Component Value Range Comment   Hemoglobin A1C 11.9 (*) <5.7 %    Mean Plasma Glucose 295 (*) <117 mg/dL   PROTIME-INR     Status: Normal   Collection Time   12/04/11  2:46 AM      Component Value Range Comment   Prothrombin Time 14.1  11.6 - 15.2 seconds    INR 1.07  0.00 - 1.49   APTT     Status: Normal   Collection Time   12/04/11  2:46 AM      Component Value Range Comment   aPTT 37  24 - 37 seconds   CULTURE, BLOOD (ROUTINE X 2)     Status: Normal (Preliminary result)   Collection Time   12/04/11  2:50 AM      Component Value Range Comment   Specimen Description BLOOD LEFT  ARM      Special Requests BOTTLES DRAWN AEROBIC AND ANAEROBIC 10CC EA      Culture  Setup Time 12/04/2011 08:46      Culture        Value:        BLOOD CULTURE RECEIVED NO GROWTH TO DATE CULTURE WILL BE HELD FOR 5 DAYS BEFORE ISSUING A FINAL NEGATIVE REPORT   Report Status PENDING     MRSA PCR SCREENING     Status: Normal   Collection Time   12/04/11  6:03 AM      Component Value Range Comment   MRSA by PCR NEGATIVE  NEGATIVE   GLUCOSE, CAPILLARY     Status: Abnormal   Collection Time   12/04/11  7:03 AM      Component Value Range Comment    Glucose-Capillary 134 (*) 70 - 99 mg/dL   BASIC METABOLIC PANEL     Status: Abnormal   Collection Time   12/04/11  8:31 AM      Component Value Range Comment   Sodium 136  135 - 145 mEq/L    Potassium 3.6  3.5 - 5.1 mEq/L    Chloride 103  96 - 112 mEq/L    CO2 22  19 - 32 mEq/L    Glucose, Bld 139 (*) 70 - 99 mg/dL    BUN 13  6 - 23 mg/dL    Creatinine, Ser 4.09  0.50 - 1.35 mg/dL    Calcium 8.6  8.4 - 81.1 mg/dL    GFR calc non Af Amer 69 (*) >90 mL/min    GFR calc Af Amer 80 (*) >90 mL/min   CBC     Status: Abnormal   Collection Time   12/04/11  8:31 AM      Component Value Range Comment   WBC 10.7 (*) 4.0 - 10.5 K/uL    RBC 3.69 (*) 4.22 - 5.81 MIL/uL    Hemoglobin 10.9 (*) 13.0 - 17.0 g/dL    HCT 91.4 (*) 78.2 - 52.0 %    MCV 86.4  78.0 - 100.0 fL    MCH 29.5  26.0 - 34.0 pg    MCHC 34.2  30.0 - 36.0 g/dL    RDW 95.6  21.3 - 08.6 %    Platelets 394  150 - 400 K/uL   GLUCOSE, CAPILLARY     Status: Abnormal   Collection Time   12/04/11  9:37 AM      Component Value Range Comment   Glucose-Capillary 135 (*) 70 - 99 mg/dL    Comment 1 Notify RN     GLUCOSE, CAPILLARY     Status: Abnormal   Collection Time   12/04/11 11:20 AM      Component Value Range Comment   Glucose-Capillary 234 (*) 70 - 99 mg/dL    Comment 1 Notify RN     GLUCOSE, CAPILLARY     Status: Normal   Collection Time   12/04/11  3:45 PM      Component Value Range Comment   Glucose-Capillary 71  70 - 99 mg/dL    Comment 1 Notify RN     GLUCOSE, CAPILLARY     Status: Abnormal   Collection Time   12/04/11  9:46 PM      Component Value Range Comment   Glucose-Capillary 334 (*) 70 - 99 mg/dL    Comment 1 Notify RN     GLUCOSE, CAPILLARY     Status: Abnormal  Collection Time   12/05/11  6:43 AM      Component Value Range Comment   Glucose-Capillary 341 (*) 70 - 99 mg/dL    Comment 1 Notify RN     GLUCOSE, CAPILLARY     Status: Abnormal   Collection Time   12/05/11 11:28 AM      Component Value Range  Comment   Glucose-Capillary 251 (*) 70 - 99 mg/dL    Comment 1 Documented in Chart      Comment 2 Notify RN     GLUCOSE, CAPILLARY     Status: Abnormal   Collection Time   12/05/11  4:43 PM      Component Value Range Comment   Glucose-Capillary 211 (*) 70 - 99 mg/dL    Comment 1 Documented in Chart      Comment 2 Notify RN       Mr Foot Left W Wo Contrast  12/04/2011  *RADIOLOGY REPORT*  Clinical Data: Diabetes.  Toe amputations.  Left second toe pain. Possible osteomyelitis.  MRI OF THE LEFT FOREFOOT WITHOUT AND WITH CONTRAST  Technique:  Multiplanar, multisequence MR imaging was performed both before and after administration of intravenous contrast.  Contrast: 18mL MULTIHANCE GADOBENATE DIMEGLUMINE 529 MG/ML IV SOLN  Comparison: 12/04/2011  Findings: Prior amputation of the first digit at the level of the distal metatarsal, and fourth digit at the mid metatarsal level noted.  In the first digit, there is minimal progression of the expansile soft tissue density in the expected location of the first metatarsal head with low-level edema in the adjacent shaft, and thick adjacent marginal periosteal reaction as well as surrounding enhancing soft tissues.  The original sesamoids appear to be within these enhancing soft tissues.  In the second digit, there is abnormal osseous edema in the shaft with thick distal periosteal reaction and deformity at the base of the distal head of the second metatarsal suggesting prior fracture and quite likely chronic infection.  There is abnormal edema in the proximal phalanx with dislocation of the middle phalanx in the plantar direction and 8 mm of bony overlap as shown on image 12 of series 7.  In the third digit, there is extensive distal periosteal reaction causing poor definition of the distal cortex of the third metatarsal. Deformity from prior distal metadiaphyseal fracture noted.  Low-level abnormal edema is present in the proximal phalanx.  There is also abnormal  edema in the diaphysis of the metatarsal.  In the fourth digit, amputation of the mid metatarsal level was observed with edema in the remaining proximal shaft.  Fifth digit unremarkable.  Extensive soft tissue edema noted in the toes, dorsum of the foot, and tracking along the plantar musculature of the foot, with associated abnormal enhancement.  There is thickening of the distal medial band the plantar fascia.  A well-defined drainable abscess is not observed.  IMPRESSION:  1.  Proximal interphalangeal joint dislocation of the second toe with abnormal osseous edema in the proximal phalanx probably from osteomyelitis. 2.  Marked periosteal reaction associated with suspected healed fractures of the distal second and third metatarsals, and with edema in the metatarsal diaphyses.  Given the degree of periosteal reaction, chronic infection is suspected, and there are associated bony erosions. 3.  Diffuse regional cellulitis and myositis. 4.  Expansile soft tissue density in the expected location of the first metatarsal head, possibly from chronic infection/phlegmon.  A drainable abscess is not observed.   Original Report Authenticated By: Dellia Cloud, M.D.  Dg Toe 2nd Left  12/04/2011  *RADIOLOGY REPORT*  Clinical Data: Toe pain, necrosis, diabetes.  LEFT SECOND TOE  Comparison: 11/10/2011  Findings: Dislocation and medial subluxation of the second PIP joint.  Status post amputation of the distal aspect of the distal phalanx second digit.  Status post amputation of the phalanges of the first digit.  Cortical irregularity of the distal aspect of the metatarsals, similar to prior.  IMPRESSION: There appears be interval dislocation at the second PIP joint.  Otherwise, areas of cortical irregularity and soft tissue swelling are similar to recent prior.  Underlying septic arthritis or osteomyelitis not excluded.   Original Report Authenticated By: Waneta Martins, M.D.     Review of Systems    Constitutional: Negative.   HENT: Negative.   Eyes: Negative.   Respiratory: Negative.   Cardiovascular: Negative.   Gastrointestinal: Negative.   Genitourinary: Negative.   Musculoskeletal: Negative.   Skin: Negative.   Neurological: Negative.    Blood pressure 170/110, pulse 94, temperature 98.1 F (36.7 C), temperature source Oral, resp. rate 16, height 5\' 8"  (1.727 m), weight 93.6 kg (206 lb 5.6 oz), SpO2 100.00%. Physical Exam  Constitutional: He appears well-developed.  HENT:  Head: Normocephalic.  Eyes: Pupils are equal, round, and reactive to light.  Neck: Normal range of motion.  Cardiovascular: Normal rate.   Respiratory: Effort normal.   left foot is examined he he has absent the great toe and toe #4. Second toe has gangrene present. Third toe also has evidence of early swelling possible infection. Fourth toe is absent fifth toe is intact absent sensation or significantly decreased sensation on the dorsum plantar aspect of the foot pedal pulses trace palpable does not have a heel cord contracture on the left right foot is examined and amputations of the performed in general is intact  Assessment/Plan: Impression this 46 year old patient with poorly controlled diabetes who has recurrent toe infection. 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 Risk and benefits discussed with patient we will and to infection recurrent persistent pain incomplete healing patient understands risk and benefits and wished to proceed with surgery medical decision-making calcaneum a decision for surgery.  Carrina Schoenberger SCOTT 12/05/2011, 5:52 PM

## 2011-12-05 NOTE — Progress Notes (Signed)
UR COMPLETED  

## 2011-12-05 NOTE — Progress Notes (Signed)
Orthopedic Tech Progress Note Patient Details:  [name] March 30, 1966 [id number]  Patient ID: [name], male   DOB: 10-21-1965, 46 y.o.   MRN: [id number] Post op boot was left in patient's room  Nikki Dom 12/05/2011, 9:53 PM

## 2011-12-05 NOTE — Anesthesia Procedure Notes (Signed)
Procedure Name: LMA Insertion Date/Time: 12/05/2011 6:00 PM Performed by: Arlice Colt B Pre-anesthesia Checklist: Patient identified, Emergency Drugs available, Suction available, Patient being monitored and Timeout performed Patient Re-evaluated:Patient Re-evaluated prior to inductionOxygen Delivery Method: Circle system utilized Preoxygenation: Pre-oxygenation with 100% oxygen Intubation Type: IV induction LMA: LMA inserted LMA Size: 5.0 Number of attempts: 1 Placement Confirmation: positive ETCO2 and breath sounds checked- equal and bilateral Tube secured with: Tape Dental Injury: Teeth and Oropharynx as per pre-operative assessment

## 2011-12-05 NOTE — Transfer of Care (Signed)
Immediate Anesthesia Transfer of Care Note  Patient: [name]  Procedure(s) Performed: Procedure(s) (LRB): AMPUTATION RAY (Left)  Patient Location: PACU  Anesthesia Type: General  Level of Consciousness: awake, oriented and patient cooperative  Airway & Oxygen Therapy: Patient Spontanous Breathing and Patient connected to nasal cannula oxygen  Post-op Assessment: Report given to PACU RN  Post vital signs: Reviewed and stable  Complications: No apparent anesthesia complications

## 2011-12-05 NOTE — Progress Notes (Signed)
Clinical Social Work Department BRIEF PSYCHOSOCIAL ASSESSMENT 12/05/2011  Patient:  [name], [name]     Account Number:  [id number]     Admit date:  12/03/2011  Clinical Social Worker:  Conley Simmonds  Date/Time:  12/05/2011 02:35 PM  Referred by:  Physician  Date Referred:  12/04/2011 Referred for  Other - See comment   Other Referral:   Housing/Financial resources   Interview type:  Other - See comment Other interview type:   Pt and family    PSYCHOSOCIAL DATA Living Status:  FAMILY Admitted from facility:   Level of care:   Primary support name:  [name] Primary support relationship to patient:   Degree of support available:   Strong    CURRENT CONCERNS Current Concerns  Post-Acute Placement   Other Concerns:    SOCIAL WORK ASSESSMENT / PLAN CSW met with pt and family (wife and adult child) at bedside-Pt has not had electricity or running water in home for past 5 years-Pt and wife have participated in [employer]" housing program and as such are not eligible for said program again-  Prior to inpatient outpatient SW has worked with family with regards to obtaining shelter while awaiting SSDI and medicaid-  Now that pt admitted and pending amputation financial counselor will be meeting with pt to complete medicaid application-  Pt has Disability hearing in November and is hopeful that he will be able to regain light and power in the home-  CSW discussed dispo with pt and family and will revisit with them in AM   Assessment/plan status:  Psychosocial Support/Ongoing Assessment of Needs Other assessment/ plan:   Information/referral to community resources:   Shelters/ALF    PATIENT'S/FAMILY'S RESPONSE TO PLAN OF CARE: Pt and wife would like to stay together however both are understanding of pt medical needs post amputation-  In addition pt may need increased wound care and medication management that may not be available in shelter-  CSW discussed  possibility of ALF and both parties agreeable-  Family will discuss overnight and f/u in AM-  [employer], [phone]

## 2011-12-05 NOTE — Progress Notes (Signed)
Orthopedic Tech Progress Note Patient Details:  [name] Jul 27, 1965 [id number]  Patient ID: [name], male   DOB: 21-Jan-1966, 46 y.o.   MRN: [id number] Viewed order from doctor's order list  Nikki Dom 12/05/2011, 9:54 PM

## 2011-12-05 NOTE — Anesthesia Postprocedure Evaluation (Signed)
Anesthesia Post Note  Patient: [name]  Procedure(s) Performed: Procedure(s) (LRB): AMPUTATION RAY (Left)  Anesthesia type: general  Patient location: PACU  Post pain: Pain level controlled  Post assessment: Patient's Cardiovascular Status Stable  Last Vitals:  Filed Vitals:   12/05/11 1646  BP: 170/110  Pulse: 94  Temp: 36.7 C  Resp: 16    Post vital signs: Reviewed and stable  Level of consciousness: sedated  Complications: No apparent anesthesia complications

## 2011-12-05 NOTE — Brief Op Note (Signed)
12/04/2011 - 12/05/2011  7:06 PM  PATIENT:  [name]  46 y.o. male  PRE-OPERATIVE DIAGNOSIS:  toe infection 2/3  POST-OPERATIVE DIAGNOSIS:  toe infection 2/3 PROCEDURE:  Procedure(s): AMPUTATION toes 2/3 with metatarsal head SURGEON:  Surgeon(s): Cammy Copa, MD  ASSISTANT:none ANESTHESIA:   general  EBL: 10 ml       BLOOD ADMINISTERED: none  DRAINS: none   LOCAL MEDICATIONS USED:  none  SPECIMEN:  No Specimen  COUNTS:  YES  TOURNIQUET:   Total Tourniquet Time Documented: Calf (Left) - 23 minutes  DICTATION: .Other Dictation: Dictation Number [id number]  PLAN OF CARE: Admit to inpatient   PATIENT DISPOSITION:  PACU - hemodynamically stable

## 2011-12-05 NOTE — Progress Notes (Signed)
Orthopedic Tech Progress Note Patient Details:  [name] July 05, 1965 [id number]  Ortho Devices Type of Ortho Device: Postop boot Ortho Device/Splint Location: left foot Ortho Device/Splint Interventions: [name], [name] 12/05/2011, 9:53 PM

## 2011-12-05 NOTE — Anesthesia Preprocedure Evaluation (Signed)
Anesthesia Evaluation  Patient identified by MRN, date of birth, ID band Patient awake    Reviewed: Allergy & Precautions, H&P , NPO status , Patient's Chart, lab work & pertinent test results  Airway Mallampati: I TM Distance: >3 FB Neck ROM: full    Dental   Pulmonary Current Smoker,          Cardiovascular hypertension, + dysrhythmias Rhythm:regular Rate:Normal     Neuro/Psych PSYCHIATRIC DISORDERS  Neuromuscular disease    GI/Hepatic PUD, GERD-  ,  Endo/Other  Poorly Controlled, Type 2, Oral Hypoglycemic Agents and Insulin Dependent  Renal/GU Renal disease     Musculoskeletal   Abdominal   Peds  Hematology   Anesthesia Other Findings   Reproductive/Obstetrics                           Anesthesia Physical Anesthesia Plan  ASA: III  Anesthesia Plan: General   Post-op Pain Management:    Induction: Intravenous  Airway Management Planned: LMA  Additional Equipment:   Intra-op Plan:   Post-operative Plan: Extubation in OR  Informed Consent: I have reviewed the patients History and Physical, chart, labs and discussed the procedure including the risks, benefits and alternatives for the proposed anesthesia with the patient or authorized representative who has indicated his/her understanding and acceptance.     Plan Discussed with: CRNA, Anesthesiologist and Surgeon  Anesthesia Plan Comments:         Anesthesia Quick Evaluation

## 2011-12-05 NOTE — Progress Notes (Signed)
Subjective: [name] is doing well this morning. He does not have any complaints he states his foot pain is much better with the Percocet. He denies any fever, chills, shortness of breath, chest pain.  Objective: Vital signs in last 24 hours: Filed Vitals:   12/04/11 0619 12/04/11 1300 12/04/11 2118 12/05/11 0540  BP:  149/88 145/85 153/110  Pulse:  89 82 95  Temp:  97.9 F (36.6 C) 97.9 F (36.6 C) 97.9 F (36.6 C)  TempSrc:  Oral    Resp:  18 18 18   Height: 5\' 8"  (1.727 m)     Weight: 206 lb 5.6 oz (93.6 kg)     SpO2:  100% 100% 100%   Weight change:   Intake/Output Summary (Last 24 hours) at 12/05/11 0829 Last data filed at 12/05/11 0817  Gross per 24 hour  Intake    240 ml  Output      0 ml  Net    240 ml    Physical Exam Blood pressure 153/110, pulse 95, temperature 97.9 F (36.6 C), temperature source Oral, resp. rate 18, height 5\' 8"  (1.727 m), weight 206 lb 5.6 oz (93.6 kg), SpO2 100.00%. General:  No acute distress, alert and oriented x 3, well-appearing  HEENT:  PERRL, EOMI, no lymphadenopathy, moist mucous membranes Cardiovascular:  Regular rate and rhythm, no murmurs, rubs or gallops Respiratory:  Clear to auscultation bilaterally, no wheezes, rales, or rhonchi Abdomen:  Soft, nondistended, nontender, normoactive bowel sounds Extremities:  : Left great toe and L fourth toe surgically absent. Left 2nd toe edematous, with open ulcer on dorsal surface with purulent discharge, foul odor. trace LLE pitting edema, no edema in RLE. No erythema, increased warmth of LLE vs RLE. Decreased sensation bilaterally in LE. 2+ peripheral pulses Callous formation over b/l plantar surface. Areas of thickened skin and skin breakdown over multiple toes. Skin: scaling, flaky skin in L axilla Neuro: Not anxious appearing, no depressed mood, normal affect  Lab Results: CBC    Component Value Date/Time   WBC 10.7* 12/04/2011 0831   RBC 3.69* 12/04/2011 0831   HGB 10.9* 12/04/2011 0831    HCT 31.9* 12/04/2011 0831   PLT 394 12/04/2011 0831   MCV 86.4 12/04/2011 0831   MCH 29.5 12/04/2011 0831   MCHC 34.2 12/04/2011 0831   RDW 12.6 12/04/2011 0831   LYMPHSABS 2.8 12/03/2011 2307   MONOABS 1.0 12/03/2011 2307   EOSABS 0.3 12/03/2011 2307   BASOSABS 0.0 12/03/2011 2307    BMET    Component Value Date/Time   NA 136 12/04/2011 0831   K 3.6 12/04/2011 0831   CL 103 12/04/2011 0831   CO2 22 12/04/2011 0831   GLUCOSE 139* 12/04/2011 0831   BUN 13 12/04/2011 0831   CREATININE 1.23 12/04/2011 0831   CREATININE 1.43* 09/05/2011 1402   CALCIUM 8.6 12/04/2011 0831   GFRNONAA 69* 12/04/2011 0831   GFRAA 80* 12/04/2011 0831     Micro Results: Recent Results (from the past 240 hour(s))  MRSA PCR SCREENING     Status: Normal   Collection Time   12/04/11  6:03 AM      Component Value Range Status Comment   MRSA by PCR NEGATIVE  NEGATIVE Final     Studies/Results: Mr Foot Left W Wo Contrast  12/04/2011  *RADIOLOGY REPORT*  Clinical Data: Diabetes.  Toe amputations.  Left second toe pain. Possible osteomyelitis.  MRI OF THE LEFT FOREFOOT WITHOUT AND WITH CONTRAST  Technique:  Multiplanar, multisequence MR  imaging was performed both before and after administration of intravenous contrast.  Contrast: 18mL MULTIHANCE GADOBENATE DIMEGLUMINE 529 MG/ML IV SOLN  Comparison: 12/04/2011  Findings: Prior amputation of the first digit at the level of the distal metatarsal, and fourth digit at the mid metatarsal level noted.  In the first digit, there is minimal progression of the expansile soft tissue density in the expected location of the first metatarsal head with low-level edema in the adjacent shaft, and thick adjacent marginal periosteal reaction as well as surrounding enhancing soft tissues.  The original sesamoids appear to be within these enhancing soft tissues.  In the second digit, there is abnormal osseous edema in the shaft with thick distal periosteal reaction and deformity at the base of the  distal head of the second metatarsal suggesting prior fracture and quite likely chronic infection.  There is abnormal edema in the proximal phalanx with dislocation of the middle phalanx in the plantar direction and 8 mm of bony overlap as shown on image 12 of series 7.  In the third digit, there is extensive distal periosteal reaction causing poor definition of the distal cortex of the third metatarsal. Deformity from prior distal metadiaphyseal fracture noted.  Low-level abnormal edema is present in the proximal phalanx.  There is also abnormal edema in the diaphysis of the metatarsal.  In the fourth digit, amputation of the mid metatarsal level was observed with edema in the remaining proximal shaft.  Fifth digit unremarkable.  Extensive soft tissue edema noted in the toes, dorsum of the foot, and tracking along the plantar musculature of the foot, with associated abnormal enhancement.  There is thickening of the distal medial band the plantar fascia.  A well-defined drainable abscess is not observed.  IMPRESSION:  1.  Proximal interphalangeal joint dislocation of the second toe with abnormal osseous edema in the proximal phalanx probably from osteomyelitis. 2.  Marked periosteal reaction associated with suspected healed fractures of the distal second and third metatarsals, and with edema in the metatarsal diaphyses.  Given the degree of periosteal reaction, chronic infection is suspected, and there are associated bony erosions. 3.  Diffuse regional cellulitis and myositis. 4.  Expansile soft tissue density in the expected location of the first metatarsal head, possibly from chronic infection/phlegmon.  A drainable abscess is not observed.   Original Report Authenticated By: Dellia Cloud, M.D.    Dg Toe 2nd Left  12/04/2011  *RADIOLOGY REPORT*  Clinical Data: Toe pain, necrosis, diabetes.  LEFT SECOND TOE  Comparison: 11/10/2011  Findings: Dislocation and medial subluxation of the second PIP joint.   Status post amputation of the distal aspect of the distal phalanx second digit.  Status post amputation of the phalanges of the first digit.  Cortical irregularity of the distal aspect of the metatarsals, similar to prior.  IMPRESSION: There appears be interval dislocation at the second PIP joint.  Otherwise, areas of cortical irregularity and soft tissue swelling are similar to recent prior.  Underlying septic arthritis or osteomyelitis not excluded.   Original Report Authenticated By: Waneta Martins, M.D.     Medications: medications reviewed Scheduled Meds:   . clotrimazole   Topical BID  . FLUoxetine  20 mg Oral Daily  . gabapentin  600 mg Oral BID  . gadobenate dimeglumine  18 mL Intravenous Once  . hydrochlorothiazide  25 mg Oral Daily  . insulin aspart  0-15 Units Subcutaneous TID WC  . insulin aspart  0-5 Units Subcutaneous QHS  . insulin NPH  10 Units Subcutaneous BID AC  . nicotine  21 mg Transdermal Daily  . pantoprazole  80 mg Oral Q1200  . piperacillin-tazobactam (ZOSYN)  IV  3.375 g Intravenous Q8H  . vancomycin  1,000 mg Intravenous Q12H  . DISCONTD: heparin  5,000 Units Subcutaneous Q8H   Continuous Infusions:   . sodium chloride 1,000 mL (12/04/11 0705)   PRN Meds:.acetaminophen, acetaminophen, oxyCODONE-acetaminophen, traZODone, DISCONTD: HYDROcodone-acetaminophen  Assessment/Plan:  Mr. 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 Adm WBC = 12.8. Afebrile. X-ray of 2nd left toe revealed interval dislocation (since 11/10/2011) of PIP joint. LE doppler negative for DVT, blood cultures pending  *MRI with probable osteomyelitis  -ortho consulted, will keep pt NPO until surgical plans are known - continue IV vanc/zosyn  -  continue gabapentin  - percocet PRN for pain  Tobacco abuse - Current smoker, 1/2 packs per day.  - nicotine patch  -encourage cessation  Pruritic lesion in left axilla - likely dermatophytosis, improved with antifungal cream. Scaling/flaking also present.  - clotrimazole cream   Diabetes mellitus - poor compliance at home with insulin regimen (lantus 40U AM, 34U evening), with reported hypoglycemic episodes on past admissions. Will therefore exercise caution in glycemic control.  *CBGs 71-341 overnight - will increase dose of NPH to 20u BID - SSI  -carb modified diet, no juices  Chronic Kidney Disease, stage 2: baseline Cr appears to be about 1.3-1.4 -Patient is at baseline -Continue to monitor  Hypertension - on accuretic. Non-compliant, will hold on resuming dual therapy at this time.  - HCTZ 25mg    Depression - Patient has been on prozac for two weeks, with no change in depressive symptoms. Patient aware medication may take 4-6 weeks to exert effects. Denies worsened depression.  - continue prozac   DVT ppx - heparin  [place], no transportation. Uses firewood for heat during the winter. He has qualified for disability but will not become active until November. -CSW to help with bus pass for follow up appointments if needed at d/c -may need to go to a shelter in the area after discharge, CSW to help with this   LOS: 1 day   [place] Ar 12/05/2011, 8:29 AM

## 2011-12-06 ENCOUNTER — Encounter (HOSPITAL_COMMUNITY): Payer: Self-pay | Admitting: Certified Registered"

## 2011-12-06 ENCOUNTER — Encounter (HOSPITAL_COMMUNITY): Payer: Self-pay | Admitting: Orthopedic Surgery

## 2011-12-06 ENCOUNTER — Encounter: Payer: Self-pay | Admitting: Internal Medicine

## 2011-12-06 DIAGNOSIS — M869 Osteomyelitis, unspecified: Secondary | ICD-10-CM

## 2011-12-06 DIAGNOSIS — E119 Type 2 diabetes mellitus without complications: Secondary | ICD-10-CM

## 2011-12-06 DIAGNOSIS — R300 Dysuria: Secondary | ICD-10-CM

## 2011-12-06 LAB — BASIC METABOLIC PANEL
CO2: 23 mEq/L (ref 19–32)
Chloride: 94 mEq/L — ABNORMAL LOW (ref 96–112)
GFR calc non Af Amer: 64 mL/min — ABNORMAL LOW (ref >90)
Glucose, Bld: 406 mg/dL — ABNORMAL HIGH (ref 70–99)
Potassium: 4.1 mEq/L (ref 3.5–5.1)
Sodium: 128 mEq/L — ABNORMAL LOW (ref 135–145)

## 2011-12-06 LAB — URINALYSIS, ROUTINE W REFLEX MICROSCOPIC
Bilirubin Urine: NEGATIVE
Hgb urine dipstick: NEGATIVE
Ketones, ur: NEGATIVE mg/dL
Specific Gravity, Urine: 1.023 (ref 1.005–1.030)
pH: 6 (ref 5.0–8.0)

## 2011-12-06 LAB — GLUCOSE, CAPILLARY: Glucose-Capillary: 439 mg/dL — ABNORMAL HIGH (ref 70–99)

## 2011-12-06 LAB — URINE MICROSCOPIC-ADD ON

## 2011-12-06 MED ORDER — AMOXICILLIN 500 MG PO CAPS: 500.0000 mg | ORAL_CAPSULE | Freq: Three times a day (TID) | ORAL | Status: DC

## 2011-12-06 MED ORDER — INSULIN ASPART PROT & ASPART (70-30 MIX) 100 UNIT/ML ~~LOC~~ SUSP
40.0000 [IU] | Freq: Every day | SUBCUTANEOUS | Status: DC
  Administered 2011-12-07: 40 [IU] via SUBCUTANEOUS
  Filled 2011-12-06: qty 3

## 2011-12-06 MED ORDER — ZOLPIDEM TARTRATE 5 MG PO TABS
5.0000 mg | ORAL_TABLET | Freq: Every evening | ORAL | Status: DC | PRN
  Administered 2011-12-06 – 2011-12-07 (×2): 5 mg via ORAL
  Filled 2011-12-06 (×2): qty 1

## 2011-12-06 MED ORDER — INSULIN ASPART PROT & ASPART (70-30 MIX) 100 UNIT/ML ~~LOC~~ SUSP
34.0000 [IU] | Freq: Every day | SUBCUTANEOUS | Status: DC
  Administered 2011-12-07 – 2011-12-08 (×2): 34 [IU] via SUBCUTANEOUS
  Filled 2011-12-06: qty 3

## 2011-12-06 MED ORDER — VANCOMYCIN HCL IN DEXTROSE 1-5 GM/200ML-% IV SOLN
1000.0000 mg | Freq: Two times a day (BID) | INTRAVENOUS | Status: AC
  Administered 2011-12-06: 1000 mg via INTRAVENOUS
  Filled 2011-12-06: qty 200

## 2011-12-06 MED ORDER — INSULIN ASPART 100 UNIT/ML ~~LOC~~ SOLN
15.0000 [IU] | Freq: Once | SUBCUTANEOUS | Status: AC
  Administered 2011-12-06: 15 [IU] via SUBCUTANEOUS

## 2011-12-06 MED ORDER — PNEUMOCOCCAL VAC POLYVALENT 25 MCG/0.5ML IJ INJ
0.5000 mL | INJECTION | INTRAMUSCULAR | Status: AC
  Administered 2011-12-07: 0.5 mL via INTRAMUSCULAR
  Filled 2011-12-06: qty 0.5

## 2011-12-06 MED ORDER — INSULIN GLARGINE 100 UNIT/ML ~~LOC~~ SOLN
5.0000 [IU] | Freq: Once | SUBCUTANEOUS | Status: AC
  Administered 2011-12-06: 5 [IU] via SUBCUTANEOUS

## 2011-12-06 NOTE — Progress Notes (Signed)
Subjective: [name] is doing well this morning. Went for surgery last night to remove infected toe. He denies fever, chills, and states that his pain is improved.  He is complaining of mild burning with urination, worse when using the urinal in bed.   Objective: Vital signs in last 24 hours: Filed Vitals:   12/05/11 1949 12/05/11 2039 12/06/11 0248 12/06/11 0555  BP:  163/96 140/76 136/90  Pulse: 86 86 94 93  Temp:  97.8 F (36.6 C) 98.6 F (37 C) 98.6 F (37 C)  TempSrc:  Oral Oral Oral  Resp: 14 16 18 18   Height:      Weight:      SpO2: 97% 100% 100% 99%   Weight change:   Intake/Output Summary (Last 24 hours) at 12/06/11 0845 Last data filed at 12/06/11 7846  Gross per 24 hour  Intake 2361.67 ml  Output   1850 ml  Net 511.67 ml    Physical Exam Blood pressure 136/90, pulse 93, temperature 98.6 F (37 C), temperature source Oral, resp. rate 18, height 5\' 8"  (1.727 m), weight 206 lb 5.6 oz (93.6 kg), SpO2 99.00%. General:  No acute distress, alert and oriented x 3, well-appearing  HEENT:  PERRL, EOMI, no lymphadenopathy, moist mucous membranes Cardiovascular:  Regular rate and rhythm, no murmurs, rubs or gallops Respiratory:  Clear to auscultation bilaterally, no wheezes, rales, or rhonchi Abdomen:  Soft, nondistended, nontender, normoactive bowel sounds Extremities:  L foot wrapped in bandage and boot. Did not remove. Skin: scaling, flaky skin in L axilla Neuro: Not anxious appearing, no depressed mood, normal affect  Lab Results: CBC    Component Value Date/Time   WBC 10.7* 12/04/2011 0831   RBC 3.69* 12/04/2011 0831   HGB 10.9* 12/04/2011 0831   HCT 31.9* 12/04/2011 0831   PLT 394 12/04/2011 0831   MCV 86.4 12/04/2011 0831   MCH 29.5 12/04/2011 0831   MCHC 34.2 12/04/2011 0831   RDW 12.6 12/04/2011 0831   LYMPHSABS 2.8 12/03/2011 2307   MONOABS 1.0 12/03/2011 2307   EOSABS 0.3 12/03/2011 2307   BASOSABS 0.0 12/03/2011 2307    BMET    Component Value Date/Time     NA 128* 12/06/2011 0600   K 4.1 12/06/2011 0600   CL 94* 12/06/2011 0600   CO2 23 12/06/2011 0600   GLUCOSE 406* 12/06/2011 0600   BUN 11 12/06/2011 0600   CREATININE 1.31 12/06/2011 0600   CREATININE 1.43* 09/05/2011 1402   CALCIUM 9.0 12/06/2011 0600   GFRNONAA 64* 12/06/2011 0600   GFRAA 74* 12/06/2011 0600     Micro Results: Recent Results (from the past 240 hour(s))  CULTURE, BLOOD (ROUTINE X 2)     Status: Normal (Preliminary result)   Collection Time   12/04/11  2:40 AM      Component Value Range Status Comment   Specimen Description BLOOD RIGHT HAND   Final    Special Requests BOTTLES DRAWN AEROBIC ONLY 10CC   Final    Culture  Setup Time 12/04/2011 08:46   Final    Culture     Final    Value:        BLOOD CULTURE RECEIVED NO GROWTH TO DATE CULTURE WILL BE HELD FOR 5 DAYS BEFORE ISSUING A FINAL NEGATIVE REPORT   Report Status PENDING   Incomplete   CULTURE, BLOOD (ROUTINE X 2)     Status: Normal (Preliminary result)   Collection Time   12/04/11  2:50 AM  Component Value Range Status Comment   Specimen Description BLOOD LEFT ARM   Final    Special Requests BOTTLES DRAWN AEROBIC AND ANAEROBIC 10CC EA   Final    Culture  Setup Time 12/04/2011 08:46   Final    Culture     Final    Value:        BLOOD CULTURE RECEIVED NO GROWTH TO DATE CULTURE WILL BE HELD FOR 5 DAYS BEFORE ISSUING A FINAL NEGATIVE REPORT   Report Status PENDING   Incomplete   MRSA PCR SCREENING     Status: Normal   Collection Time   12/04/11  6:03 AM      Component Value Range Status Comment   MRSA by PCR NEGATIVE  NEGATIVE Final   SURGICAL PCR SCREEN     Status: Abnormal   Collection Time   12/05/11  4:59 PM      Component Value Range Status Comment   MRSA, PCR NEGATIVE  NEGATIVE Final    Staphylococcus aureus POSITIVE (*) NEGATIVE Final     Studies/Results: No results found.  Medications: medications reviewed Scheduled Meds:    . clotrimazole   Topical BID  . FLUoxetine  20 mg Oral Daily  .  gabapentin  600 mg Oral BID  . heparin  5,000 Units Subcutaneous Q12H  . hydrochlorothiazide  25 mg Oral Daily  . HYDROmorphone      . insulin aspart  0-15 Units Subcutaneous TID WC  . insulin aspart  0-5 Units Subcutaneous QHS  . insulin aspart  15 Units Subcutaneous Once  . insulin aspart  4 Units Subcutaneous Once  . insulin NPH  10 Units Subcutaneous BID AC  . nicotine  21 mg Transdermal Daily  . pantoprazole  80 mg Oral Q1200  . piperacillin-tazobactam (ZOSYN)  IV  3.375 g Intravenous Q8H  . vancomycin  1,000 mg Intravenous Q12H  . DISCONTD: insulin NPH  10 Units Subcutaneous BID AC  . DISCONTD: insulin NPH  20 Units Subcutaneous BID AC   Continuous Infusions:    . lactated ringers 50 mL/hr at 12/05/11 2211  . 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 Adm WBC = 12.8. Afebrile. X-ray of 2nd left toe revealed interval dislocation (since 11/10/2011) of PIP joint. LE doppler negative for DVT, blood cultures pending *MRI with probable osteomyelitis  -s/p surgical amputation of 2nd adn 3rd L toes and 1/2 metatarsal 12/06/11. Specimen sent for pathology. - continue IV vanc/zosyn until 24 hours after surgery - continue gabapentin  - percocet PRN for pain - PT: no needs at home, weight bearing restrictions  Dysuria -patient with subjective dysuria. UA without leukocytes or RBCs. Afebrile.  Will continue to monitor.  Tobacco abuse - Current smoker, 1/2 packs per day.  - nicotine patch  -encourage cessation  Pruritic lesion in left axilla - likely dermatophytosis, improved with antifungal cream. Scaling/flaking also present.  - clotrimazole cream   Diabetes mellitus - poor compliance at home with insulin regimen (lantus 40U AM, 34U evening), with reported hypoglycemic episodes on  past admissions. Will therefore exercise caution in glycemic control.  *CBGs high over night (300s-439), glucose on UA - restart home regimen of 70/30 - SSI  -carb modified diet, no juices  Chronic Kidney Disease, stage 2: baseline Cr appears to be about 1.3-1.4 -Patient is at baseline -Continue to monitor  Hypertension - on accuretic. Non-compliant 2/2 financial strain, will hold on resuming dual therapy at this time.  - HCTZ 25mg    Depression/Insomnia - Patient has been on prozac for two weeks, with no change in depressive symptoms. Patient aware medication may take 4-6 weeks to exert effects. Denies worsened depression.  - continue prozac  -Ambien prn  DVT ppx - heparin  [place] situation [place] or [place], no transportation. Uses firewood for heat during the winter. He has qualified for disability but will not become active until November. -CSW to help with bus pass for follow up appointments if needed at d/c -may need to go to a shelter in the area after discharge, CSW helping   LOS: 2 days   [name] 12/06/2011, 8:45 AM

## 2011-12-06 NOTE — Progress Notes (Signed)
PT Discharge Note  Patient is being discharged from PT services secondary to:    Goals met and no further therapy needs identified.  Please see latest Therapy Progress Note for current level of functioning and progress toward goals.  Progress and discharge plan and discussed with patient/caregiver and they    Agree  Social Worker aware of psychosocial issues and to address ALF or SNF.    [place], [place] DPT [phone]

## 2011-12-06 NOTE — Evaluation (Signed)
Physical Therapy Evaluation Patient Details Name: [name] MRN: [id number] DOB: [birth year] Today's Date: 12/06/2011 Time: 1040-1105 PT Time Calculation (min): 25 min  PT Assessment / Plan / Recommendation Clinical Impression  Pt is 46 y/o male admitted for s/p left 2nd toe amputation.  Pt standing at sink without AD and cleaning without assistance when therapist entered room.   Per wife, pt has no electricity at home and was planning to go to SNF or ALF due to pyschosocial issues.    PT Assessment  Patent does not need any further PT services    Follow Up Recommendations  Skilled nursing facility (or ALF (social work aware of pyschosocial issues))    Barriers to Discharge        Equipment Recommendations  None recommended by PT    Recommendations for Other Services     Frequency      Precautions / Restrictions Precautions Precautions: Other (comment) (wear boot and most weight through heel) Restrictions Weight Bearing Restrictions: Yes LLE Weight Bearing: Weight bearing as tolerated   Pertinent Vitals/Pain C/o mild pain but does not rate      Mobility  Transfers Transfers: Sit to Stand;Stand to Sit Sit to Stand: 7: Independent Stand to Sit: 7: Independent Ambulation/Gait Ambulation/Gait Assistance: 6: Modified independent (Device/Increase time) Ambulation Distance (Feet): 200 Feet Assistive device: Other (Comment) (IV pole simulate SPC) Stairs: Yes Stairs Assistance: 6: Modified independent (Device/Increase time) Stair Management Technique: No rails Number of Stairs: 1     Exercises     PT Diagnosis:    PT Problem List:   PT Treatment Interventions:     PT Goals    Visit Information  Last PT Received On: 12/06/11 Assistance Needed: +1    Subjective Data  Subjective: "Pt wants to go to ALF to heel." Patient Stated Goal: to eventually go home   Prior Functioning  Home Living Lives With: Spouse Available Help at Discharge: Family Type of Home:  House Home Access: Ramped entrance Home Layout: One level Bathroom Shower/Tub: Health visitor: Standard Additional Comments: Pt's wife reports not having any electricity and pt would need to go to ALF or SNF Prior Function Level of Independence: Independent Driving: No Vocation: On disability (waiting on disability) Communication Communication: No difficulties Dominant Hand: Right    Cognition  Overall Cognitive Status: Appears within functional limits for tasks assessed/performed Arousal/Alertness: Awake/alert Orientation Level: Appears intact for tasks assessed Behavior During Session: [facility] for tasks performed    Extremity/Trunk Assessment Right Upper Extremity Assessment RUE ROM/Strength/Tone: Within functional levels Left Upper Extremity Assessment LUE ROM/Strength/Tone: Within functional levels Right Lower Extremity Assessment RLE ROM/Strength/Tone: Within functional levels Left Lower Extremity Assessment LLE ROM/Strength/Tone: Unable to fully assess;Due to pain   Balance    End of Session PT - End of Session Equipment Utilized During Treatment: Gait belt;Other (comment) (post op boot waiting for draco shoe) Activity Tolerance: Patient tolerated treatment well Patient left: in chair;with call bell/phone within reach Nurse Communication: Mobility status  GP     [name] 12/06/2011, 1:05 PM [place], PT DPT [phone]

## 2011-12-06 NOTE — Op Note (Signed)
[name], [name]                 ACCOUNT NO.:  [id number]  MEDICAL RECORD NO.:  [id number]  LOCATION:  [facility]                         FACILITY:  [facility]  PHYSICIAN:  Burnard Bunting, M.D.    DATE OF BIRTH:  [birth year]  DATE OF PROCEDURE: DATE OF DISCHARGE:                              OPERATIVE REPORT   PREOP DIAGNOSIS:  Left foot toes 2 and 3, infection.  POSTOPERATIVE DIAGNOSIS:  Left foot toes 2 and 3, infection.  PROCEDURE:  Left toes 2 and 3, amputation including half the metatarsal.  SURGEON:  Burnard Bunting, MD  ASSISTANT:  None.  INDICATIONS:  Heart palpitations, diabetes, toe infections 2 and 3, presents for operative management after explanation of risks, benefits.  SPECIMENS:  Toe 2 and 3 plus metatarsal heads and 5 metatarsal shaft sent to pathology.  PROCEDURE IN DETAIL:  The patient was brought to operating room, where general endotracheal anesthesia was induced.  Preoperative antibiotics were administered.  Time-out was called.  Left foot was prepped with Hibiclens saline, draped in sterile manner.  Elliptical incision made around the base of the 2nd and 3rd toes.  The toes and metatarsal heads were amputated.  Shaft was beveled to prevent plantar ulceration.  An ankle Esmarch was utilized for approximately 15 minutes.  Toe sent for specimen to path.  Bleeding points were encountered and closed with electrocautery, thorough irrigation performed.  Anterior-posterior flaps closed using 2-0 Vicryl suture, 2-0 nylon suture.  Bulky dressing applied.  The patient tolerated the procedure well without immediate complication.     Burnard Bunting, M.D.     GSD/MEDQ  D:  12/05/2011  T:  12/06/2011  Job:  [id number]

## 2011-12-06 NOTE — Progress Notes (Signed)
Inpatient Diabetes Program Recommendations  AACE/ADA: New Consensus Statement on Inpatient Glycemic Control (2013)  Target Ranges:  Prepandial:   less than 140 mg/dL      Peak postprandial:   less than 180 mg/dL (1-2 hours)      Critically ill patients:  140 - 180 mg/dL   Reason for Visit: Results for [name], [name] (MRN [id number]) as of 12/06/2011 14:05  Ref. Range 12/06/2011 06:53 12/06/2011 11:12  Glucose-Capillary Latest Range: 70-99 mg/dL 147 (H) 829 (H)   Please consider resuming home dose of 70/30- 40 units q AM and 34 units with supper and discontinue NPH 10 units bid.

## 2011-12-06 NOTE — Progress Notes (Signed)
Clinical Social Work Department CLINICAL SOCIAL WORK PLACEMENT NOTE 12/06/2011  Patient:  [name], [name]  Account Number:  [id number] Admit date:  12/03/2011  Clinical Social Worker:  Bonnye Fava, LCSW  Date/time:  12/06/2011 02:00 PM  Clinical Social Work is seeking post-discharge placement for this patient at the following level of care:   ASSISTED LIVING/REST HOME   (*CSW will update this form in Epic as items are completed)   12/06/2011  Patient/family provided with [facility] Department of Clinical Social Work's list of facilities offering this level of care within the geographic area requested by the patient (or if unable, by the patient's family).  12/06/2011  Patient/family informed of their freedom to choose among providers that offer the needed level of care, that participate in Medicare, Medicaid or managed care program needed by the patient, have an available bed and are willing to accept the patient.  12/06/2011  Patient/family informed of [facility]' ownership interest in [facility], as well as of the fact that they are under no obligation to receive care at this facility.  PASARR submitted to EDS on 12/06/2011 PASARR number received from EDS on   FL2 transmitted to all facilities in geographic area requested by pt/family on  12/06/2011 FL2 transmitted to all facilities within larger geographic area on   Patient informed that his/her managed care company has contracts with or will negotiate with  certain facilities, including the following:     Patient/family informed of bed offers received:   Patient chooses bed at  Physician recommends and patient chooses bed at    Patient to be transferred to  on   Patient to be transferred to facility by   The following physician request were entered in Epic:   Additional Comments:  Jodean Lima, [phone]

## 2011-12-06 NOTE — Progress Notes (Signed)
ANTIBIOTIC CONSULT NOTE - FOLLOW UP  Pharmacy Consult for zosyn, add to vancomycin Indication: osteomyelitis of 2nd left toe s/p amputation 8/27  No Known Allergies  Patient Measurements: Height: 5\' 8"  (172.7 cm) Weight: 206 lb 5.6 oz (93.6 kg) IBW/kg (Calculated) : 68.4  Vital Signs:  Vital Signs: Temp: 98.6 F (37 C) (08/28 0555) Temp src: Oral (08/28 0555) BP: 136/90 mmHg (08/28 0555) Pulse Rate: 93  (08/28 0555) Intake/Output from previous day: 08/27 0701 - 08/28 0700 In: 2601.7 [P.O.:240; I.V.:2061.7; IV Piggyback:300] Out: 1850 [Urine:1850] Intake/Output from this shift:    Labs:  Basename 12/06/11 0600 12/04/11 0831 12/03/11 2307  WBC -- 10.7* 12.8*  HGB -- 10.9* 12.1*  PLT -- 394 417*  LABCREA -- -- --  CREATININE 1.31 1.23 1.36*   Estimated Creatinine Clearance: 78.2 ml/min (by C-G formula based on Cr of 1.31). No results found for this basename: VANCOTROUGH:2,VANCOPEAK:2,VANCORANDOM:2,GENTTROUGH:2,GENTPEAK:2,GENTRANDOM:2,TOBRATROUGH:2,TOBRAPEAK:2,TOBRARND:2,AMIKACINPEAK:2,AMIKACINTROU:2,AMIKACIN:2, in the last 72 hours   Microbiology: Recent Results (from the past 720 hour(s))  CULTURE, BLOOD (ROUTINE X 2)     Status: Normal (Preliminary result)   Collection Time   12/04/11  2:40 AM      Component Value Range Status Comment   Specimen Description BLOOD RIGHT HAND   Final    Special Requests BOTTLES DRAWN AEROBIC ONLY 10CC   Final    Culture  Setup Time 12/04/2011 08:46   Final    Culture     Final    Value:        BLOOD CULTURE RECEIVED NO GROWTH TO DATE CULTURE WILL BE HELD FOR 5 DAYS BEFORE ISSUING A FINAL NEGATIVE REPORT   Report Status PENDING   Incomplete   CULTURE, BLOOD (ROUTINE X 2)     Status: Normal (Preliminary result)   Collection Time   12/04/11  2:50 AM      Component Value Range Status Comment   Specimen Description BLOOD LEFT ARM   Final    Special Requests BOTTLES DRAWN AEROBIC AND ANAEROBIC 10CC EA   Final    Culture  Setup Time  12/04/2011 08:46   Final    Culture     Final    Value:        BLOOD CULTURE RECEIVED NO GROWTH TO DATE CULTURE WILL BE HELD FOR 5 DAYS BEFORE ISSUING A FINAL NEGATIVE REPORT   Report Status PENDING   Incomplete   MRSA PCR SCREENING     Status: Normal   Collection Time   12/04/11  6:03 AM      Component Value Range Status Comment   MRSA by PCR NEGATIVE  NEGATIVE Final   SURGICAL PCR SCREEN     Status: Abnormal   Collection Time   12/05/11  4:59 PM      Component Value Range Status Comment   MRSA, PCR NEGATIVE  NEGATIVE Final    Staphylococcus aureus POSITIVE (*) NEGATIVE Final     Assessment: [name] is on day # 3 of vancomycin and zosyn for osteomyelitis os 2nd left toe.  His 2nd and 3rd toe were amputated yesterday.  He is afebrile and his source of infection has been removed.   Plan:   Currently on Zosyn 3.375 gm IV q8hr, infuse each dose over 4 hours and vancomycin 1000 mg IV q12h  Please stop abx 24 h ours post-op. Herby Abraham, Pharm.D. [phone] 12/06/2011 11:13 AM

## 2011-12-06 NOTE — Progress Notes (Signed)
Pt stable - cbg up Dressing dry Change to darco shoe Ready for dc once medically stable -  F/u 7 days [facility]

## 2011-12-06 NOTE — Progress Notes (Signed)
[facility]   Called by RN, pt had fsbs 100 and due for 70/30 34 units.  Informed to hold dose and ordered Lantus 5 units once. Primary to address more control of fsbs  McLean MD [phone]

## 2011-12-07 LAB — GLUCOSE, CAPILLARY
Glucose-Capillary: 290 mg/dL — ABNORMAL HIGH (ref 70–99)
Glucose-Capillary: 186 mg/dL — ABNORMAL HIGH (ref 70–99)
Glucose-Capillary: 71 mg/dL (ref 70–99)
Glucose-Capillary: 121 mg/dL — ABNORMAL HIGH (ref 70–99)

## 2011-12-07 MED ORDER — CLOTRIMAZOLE 1 % EX CREA: TOPICAL_CREAM | Freq: Two times a day (BID) | CUTANEOUS | Status: DC

## 2011-12-07 MED ORDER — OXYCODONE-ACETAMINOPHEN 5-325 MG PO TABS: 1.0000 | ORAL_TABLET | Freq: Four times a day (QID) | ORAL | Status: DC | PRN

## 2011-12-07 MED ORDER — AMOXICILLIN-POT CLAVULANATE 875-125 MG PO TABS: 1.0000 | ORAL_TABLET | Freq: Two times a day (BID) | ORAL | Status: AC

## 2011-12-07 MED ORDER — AMOXICILLIN 500 MG PO CAPS: 500.0000 mg | ORAL_CAPSULE | Freq: Three times a day (TID) | ORAL | Status: DC

## 2011-12-07 MED ORDER — NICOTINE 21 MG/24HR TD PT24: 1.0000 | MEDICATED_PATCH | Freq: Every day | TRANSDERMAL | Status: AC

## 2011-12-07 NOTE — Progress Notes (Addendum)
Clinical Social Work-CSW arranging d/c with ALF along with LOG-Pt now medicaid pending however ALF has concerns regarding follow up appointment and payment due-CSW has discussed this issue with [facility] and is awaiting approval- Jodean Lima, [phone]

## 2011-12-07 NOTE — Discharge Summary (Addendum)
Internal Medicine Teaching [facility] Discharge Note  Name: [name] MRN: [id number] DOB: 1966-01-16 46 y.o.  Date of Admission: 12/04/2011 12:22 AM Date of Discharge: 12/08/2011 Attending Physician: Burns Spain, MD  Discharge Diagnosis: Principal Problem:  *Diabetic foot ulcer Active Problems:  DM (diabetes mellitus), secondary, uncontrolled, with peripheral vascular complications  HYPERTENSION  Chronic kidney disease, stage 2, mildly decreased GFR Medication noncompliance  Discharge Medications: Medication List  As of 12/08/2011 10:47 AM   STOP taking these medications         HYDROcodone-acetaminophen 5-500 MG per tablet         TAKE these medications         amoxicillin-clavulanate 875-125 MG per tablet   Commonly known as: AUGMENTIN   Take 1 tablet by mouth 2 (two) times daily.      clotrimazole 1 % cream   Commonly known as: LOTRIMIN   Apply topically 2 (two) times daily.      FLUoxetine 20 MG capsule   Commonly known as: PROZAC   Take 1 capsule (20 mg total) by mouth daily.      gabapentin 300 MG capsule   Commonly known as: NEURONTIN   Take 2 capsules (600 mg total) by mouth 2 (two) times daily.      insulin aspart protamine-insulin aspart (70-30) 100 UNIT/ML injection   Commonly known as: NOVOLOG 70/30   Subcutaneously inject 40 units in the morning and 34 units before the evening meal      nicotine 21 mg/24hr patch   Commonly known as: NICODERM CQ - dosed in mg/24 hours   Place 1 patch onto the skin daily.      omeprazole 40 MG capsule   Commonly known as: PRILOSEC   Take 1 capsule (40 mg total) by mouth daily. You can find this medication in generic form available over-the-counter.      oxyCODONE-acetaminophen 5-325 MG per tablet   Commonly known as: PERCOCET/ROXICET   Take 1 tablet by mouth every 6 (six) hours as needed.      quinapril-hydrochlorothiazide 20-12.5 MG per tablet   Commonly known as: ACCURETIC   Take 2 tablets by  mouth daily.            Disposition and follow-up:   [name] was discharged from [facility] in Stable condition.  At the hospital follow up visit please address the following issues  -f/u appointment with ortho (s/p toe amputation 12/04/11) -diabetes education and nutritional counseling -smoking cessation counseling and nicotine replacement therapy -hypertension f/u   Follow-up Appointments: Follow-up Information    Follow up with Cammy Copa, MD on 12/15/2011. (Friday Sept 6, 8:30AM )    Contact information:   [facility] [address] [place] [address] [phone]       Follow up with Dow Adolph, MD. (Sept 25 at 1:45PM)    Contact information:   [address] [place] [address] [phone]         Discharge Orders    Future Appointments: Provider: Department: Dept Phone: Center:   01/03/2012 1:45 PM Dow Adolph, MD [facility] [phone] [facility]      Consultations:  Orthopedic Surgery  Procedures Performed:  Mr Foot Left W Wo Contrast  12/04/2011  *RADIOLOGY REPORT*  Clinical Data: Diabetes.  Toe amputations.  Left second toe pain. Possible osteomyelitis.  MRI OF THE LEFT FOREFOOT WITHOUT AND WITH CONTRAST  Technique:  Multiplanar, multisequence  MR imaging was performed both before and after administration of intravenous contrast.  Contrast: 18mL MULTIHANCE GADOBENATE DIMEGLUMINE 529 MG/ML IV SOLN  Comparison: 12/04/2011  Findings: Prior amputation of the first digit at the level of the distal metatarsal, and fourth digit at the mid metatarsal level noted.  In the first digit, there is minimal progression of the expansile soft tissue density in the expected location of the first metatarsal head with low-level edema in the adjacent shaft, and thick adjacent marginal periosteal reaction as well as surrounding enhancing soft tissues.  The original  sesamoids appear to be within these enhancing soft tissues.  In the second digit, there is abnormal osseous edema in the shaft with thick distal periosteal reaction and deformity at the base of the distal head of the second metatarsal suggesting prior fracture and quite likely chronic infection.  There is abnormal edema in the proximal phalanx with dislocation of the middle phalanx in the plantar direction and 8 mm of bony overlap as shown on image 12 of series 7.  In the third digit, there is extensive distal periosteal reaction causing poor definition of the distal cortex of the third metatarsal. Deformity from prior distal metadiaphyseal fracture noted.  Low-level abnormal edema is present in the proximal phalanx.  There is also abnormal edema in the diaphysis of the metatarsal.  In the fourth digit, amputation of the mid metatarsal level was observed with edema in the remaining proximal shaft.  Fifth digit unremarkable.  Extensive soft tissue edema noted in the toes, dorsum of the foot, and tracking along the plantar musculature of the foot, with associated abnormal enhancement.  There is thickening of the distal medial band the plantar fascia.  A well-defined drainable abscess is not observed.  IMPRESSION:  1.  Proximal interphalangeal joint dislocation of the second toe with abnormal osseous edema in the proximal phalanx probably from osteomyelitis. 2.  Marked periosteal reaction associated with suspected healed fractures of the distal second and third metatarsals, and with edema in the metatarsal diaphyses.  Given the degree of periosteal reaction, chronic infection is suspected, and there are associated bony erosions. 3.  Diffuse regional cellulitis and myositis. 4.  Expansile soft tissue density in the expected location of the first metatarsal head, possibly from chronic infection/phlegmon.  A drainable abscess is not observed.   Original Report Authenticated By: Dellia Cloud, M.D.    Dg Toe 2nd  Left  12/04/2011  *RADIOLOGY REPORT*  Clinical Data: Toe pain, necrosis, diabetes.  LEFT SECOND TOE  Comparison: 11/10/2011  Findings: Dislocation and medial subluxation of the second PIP joint.  Status post amputation of the distal aspect of the distal phalanx second digit.  Status post amputation of the phalanges of the first digit.  Cortical irregularity of the distal aspect of the metatarsals, similar to prior.  IMPRESSION: There appears be interval dislocation at the second PIP joint.  Otherwise, areas of cortical irregularity and soft tissue swelling are similar to recent prior.  Underlying septic arthritis or osteomyelitis not excluded.   Original Report Authenticated By: Waneta Martins, M.D.    Dg Toe 2nd Left  11/10/2011  *RADIOLOGY REPORT*  Clinical Data: Diabetes.  First and fourth toe amputation. Possible osteomyelitis of the second toe.  LEFT SECOND TOE  Comparison: None.  Findings: There is ulceration on the dorsal aspect of the second toe.  Amputation of the great toe is present across the first metatarsal.  Chronic dystrophic changes are present in the metatarsal shafts and necks.  There is no plain film evidence of osteomyelitis of the second toe.  Diffuse soft tissue swelling is present.  Dystrophic calcifications dorsal to the proximal phalanx base of the second toe.  IMPRESSION: No plain film evidence of osteomyelitis.  Second toe soft tissue swelling with dorsal ulceration over the PIP joint.  Original Report Authenticated By: Andreas Newport, M.D.    Admission HPI:  CC: Left 2nd toe pain/discharge  Patient is a 46 y.o. 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 He states the pain began three days prior to admission, but became worse on the day of admission. He states he has also noticed a malodorous, pink discharge during that time, as well as swelling of the left leg. The patient states he has had multiple  previous toe amputations on both feet, which have been secondary to his poorly controlled diabetes. He complains of numbness in both feet which is secondary to his diabetic neuropathy and is unchanged.  Mr. Morton reveals that he has not been taking many of his medications regularly, including his novolog, which he states he cannot afford to inject as frequently as prescribed. He also states his lack of funds prevent him from being able to afford methods to assess his blood sugars as frequently as described, but admits he has checked them over the last few days and that some have been too high to be read (>600, per patient). He states he has just received disability for his peripheral neuropathy and associated toe amputations, which he will begin to be compensated for in 02/2012. 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  Patient presented with pain and foul-smelling discharge of the left second toe. His white count was 12.8, he was afebrile, x-ray revealed dislocation of the PIP joint. He got an MRI of this foot which showed probable osteomyelitis. He had some associated swelling of his left lower extremity, Dopplers were negative for DVT. Blood cultures were drawn and showed no growth to date. Orthopedic surgery was consulted and the decision was made to move forward with surgical invitation of the second and third left toes on 12/05/11. The specimen was sent for pathology, which is still pending at the time of discharge. A dressing was placed on the wound and he was given a Darco shoe, he is to followup with orthopedic surgery  in 7 days. PT has evaluated this patient and he has no current PT needs at home. We will discharge the patient with a short antibiotic course of Augmentin to end on Wednesday Sept 4. On the day of discharge, patient was afebrile, had adequate pain control, and was walking around the unit without assistance using the Darco boot.  Diabetes mellitus - patient has a long history of poorly controlled diabetes and noncompliance with medications. His glucose was 218 on admission, with an A1c of 11.9. He was not started on his home dose of insulin due to reported hypoglycemia on previous admissions. Patient states that his blood sugar normally runs between 200-300 at home and he had not been taking his insulin previous to admission.  He was given 10 units of NPH twice a day with correction scale, however his CBGs continued to be high (300-400).  He was then started back on his home insulin and his blood glucose level was stable at the time of discharge. He will need close monitoring of his blood sugar upon discharge to maximize wound healing and prevent further microvascular disease.  Dysuria: Patient was complaining of dysuria on 12/06/11. UA was checked it was without leukocytes or RBCs. Patient was afebrile without systemic signs or symptoms of UTI. No treatment was felt to be warranted at this time.  Social situation  [place], no transportation. Uses firewood for heat during the winter. He has qualified for disability but will not become active until November. Patient is unable to acquire reliable transportation to and from his house, therefore, for optimal wound healing and to prevent the need for rehospitalization and or surgery. Patient was given information on resources in the area including child is within city limits.   Tobacco abuse -Patient is a current smoker, one half PPD. During this hospitalization he received nicotine patches and was counseled on the  importance of quitting smoking and the effect it will have on wound healing and recurrent ulcerations. Patient has expressed interest in quitting. He was referred to the [employer].  Hypertension - on  patient is supposed to be on home Accuretic, however, he is non-compliant 2/2 financial strain, will hold on resuming dual therapy at this time. During this hospitalization he was just given HCTZ 25 mg. His blood pressure remained high with average systolics being in the 140s. He will likely need another agent, recommend outpatient followup.   Discharge Vitals:  BP 164/101  Pulse 105  Temp 98.6 F (37 C) (Oral)  Resp 18  Ht 5\' 8"  (1.727 m)  Wt 206 lb 5.6 oz (93.6 kg)  BMI 31.38 kg/m2  SpO2 100%  Discharge Labs:  Results for orders placed during the hospital encounter of 12/04/11 (from the past 24 hour(s))  GLUCOSE, CAPILLARY     Status: Abnormal   Collection Time   12/07/11 10:52 AM      Component Value Range   Glucose-Capillary 290 (*) 70 - 99 mg/dL   Comment 1 Documented in Chart     Comment 2 Notify RN    GLUCOSE, CAPILLARY     Status: Abnormal   Collection Time   12/07/11  1:24 PM      Component Value Range   Glucose-Capillary 186 (*) 70 - 99 mg/dL   Comment 1 Documented in Chart     Comment 2 Notify RN    GLUCOSE, CAPILLARY     Status: Normal   Collection Time   12/07/11  4:21 PM      Component Value Range   Glucose-Capillary 71  70 - 99 mg/dL   Comment 1 Notify RN     Comment 2 Documented in Chart    GLUCOSE, CAPILLARY     Status: Abnormal   Collection Time   12/07/11  9:05 PM      Component Value Range   Glucose-Capillary 121 (*) 70 - 99 mg/dL  GLUCOSE, CAPILLARY     Status: Abnormal   Collection Time   12/08/11 12:23 AM      Component Value Range   Glucose-Capillary 172 (*) 70 - 99 mg/dL  GLUCOSE, CAPILLARY     Status: Abnormal   Collection Time   12/08/11  6:42 AM  Component Value Range   Glucose-Capillary 188 (*) 70 - 99 mg/dL   Comment 1 Notify  RN      Signed: Denton Ar 12/08/2011, 10:47 AM   Time Spent on Discharge: 30 minutes

## 2011-12-07 NOTE — Progress Notes (Signed)
Subjective: [name] is doing well this morning, walking around the unit. He denies fever, chills, and states that his pain is improved. Asks for off-floor privileges to visit his aunt in another unit in the hospital.  Objective: Vital signs in last 24 hours: Filed Vitals:   12/06/11 0555 12/06/11 1402 12/06/11 2310 12/07/11 0653  BP: 136/90 149/82 159/86 144/85  Pulse: 93 110 108 98  Temp: 98.6 F (37 C) 98.2 F (36.8 C) 98.9 F (37.2 C) 98.7 F (37.1 C)  TempSrc: Oral     Resp: 18 20 18 18   Height:      Weight:      SpO2: 99% 100% 100% 100%   Weight change:   Intake/Output Summary (Last 24 hours) at 12/07/11 1025 Last data filed at 12/07/11 0900  Gross per 24 hour  Intake   1200 ml  Output      0 ml  Net   1200 ml    Physical Exam Blood pressure 144/85, pulse 98, temperature 98.7 F (37.1 C), temperature source Oral, resp. rate 18, height 5\' 8"  (1.727 m), weight 206 lb 5.6 oz (93.6 kg), SpO2 100.00%. General:  No acute distress, alert and oriented x 3, well-appearing  HEENT:  PERRL, EOMI, no lymphadenopathy, moist mucous membranes Cardiovascular:  Regular rate and rhythm, no murmurs, rubs or gallops Respiratory:  Clear to auscultation bilaterally, no wheezes, rales, or rhonchi Abdomen:  Soft, nondistended, nontender, normoactive bowel sounds Extremities:  L foot wrapped in bandage and boot. Did not remove. Skin: scaling, flaky skin in L axilla Neuro: Not anxious appearing, no depressed mood, normal affect  Lab Results: CBC    Component Value Date/Time   WBC 10.7* 12/04/2011 0831   RBC 3.69* 12/04/2011 0831   HGB 10.9* 12/04/2011 0831   HCT 31.9* 12/04/2011 0831   PLT 394 12/04/2011 0831   MCV 86.4 12/04/2011 0831   MCH 29.5 12/04/2011 0831   MCHC 34.2 12/04/2011 0831   RDW 12.6 12/04/2011 0831   LYMPHSABS 2.8 12/03/2011 2307   MONOABS 1.0 12/03/2011 2307   EOSABS 0.3 12/03/2011 2307   BASOSABS 0.0 12/03/2011 2307    BMET    Component Value Date/Time   NA 128*  12/06/2011 0600   K 4.1 12/06/2011 0600   CL 94* 12/06/2011 0600   CO2 23 12/06/2011 0600   GLUCOSE 387* 12/06/2011 1010   BUN 11 12/06/2011 0600   CREATININE 1.31 12/06/2011 0600   CREATININE 1.43* 09/05/2011 1402   CALCIUM 9.0 12/06/2011 0600   GFRNONAA 64* 12/06/2011 0600   GFRAA 74* 12/06/2011 0600     Micro Results: Recent Results (from the past 240 hour(s))  CULTURE, BLOOD (ROUTINE X 2)     Status: Normal (Preliminary result)   Collection Time   12/04/11  2:40 AM      Component Value Range Status Comment   Specimen Description BLOOD RIGHT HAND   Final    Special Requests BOTTLES DRAWN AEROBIC ONLY 10CC   Final    Culture  Setup Time 12/04/2011 08:46   Final    Culture     Final    Value:        BLOOD CULTURE RECEIVED NO GROWTH TO DATE CULTURE WILL BE HELD FOR 5 DAYS BEFORE ISSUING A FINAL NEGATIVE REPORT   Report Status PENDING   Incomplete   CULTURE, BLOOD (ROUTINE X 2)     Status: Normal (Preliminary result)   Collection Time   12/04/11  2:50 AM  Component Value Range Status Comment   Specimen Description BLOOD LEFT ARM   Final    Special Requests BOTTLES DRAWN AEROBIC AND ANAEROBIC 10CC EA   Final    Culture  Setup Time 12/04/2011 08:46   Final    Culture     Final    Value:        BLOOD CULTURE RECEIVED NO GROWTH TO DATE CULTURE WILL BE HELD FOR 5 DAYS BEFORE ISSUING A FINAL NEGATIVE REPORT   Report Status PENDING   Incomplete   MRSA PCR SCREENING     Status: Normal   Collection Time   12/04/11  6:03 AM      Component Value Range Status Comment   MRSA by PCR NEGATIVE  NEGATIVE Final   SURGICAL PCR SCREEN     Status: Abnormal   Collection Time   12/05/11  4:59 PM      Component Value Range Status Comment   MRSA, PCR NEGATIVE  NEGATIVE Final    Staphylococcus aureus POSITIVE (*) NEGATIVE Final     Studies/Results: No results found.  Medications: medications reviewed Scheduled Meds:    . clotrimazole   Topical BID  . FLUoxetine  20 mg Oral Daily  . gabapentin   600 mg Oral BID  . heparin  5,000 Units Subcutaneous Q12H  . hydrochlorothiazide  25 mg Oral Daily  . insulin aspart  0-15 Units Subcutaneous TID WC  . insulin aspart  0-5 Units Subcutaneous QHS  . insulin aspart protamine-insulin aspart  34 Units Subcutaneous Q supper   And  . insulin aspart protamine-insulin aspart  40 Units Subcutaneous Q breakfast  . insulin glargine  5 Units Subcutaneous Once  . nicotine  21 mg Transdermal Daily  . pantoprazole  80 mg Oral Q1200  . pneumococcal 23 valent vaccine  0.5 mL Intramuscular Tomorrow-1000  . vancomycin  1,000 mg Intravenous Q12H  . DISCONTD: amoxicillin  500 mg Oral Q8H  . DISCONTD: insulin NPH  10 Units Subcutaneous BID AC  . DISCONTD: piperacillin-tazobactam (ZOSYN)  IV  3.375 g Intravenous Q8H  . DISCONTD: vancomycin  1,000 mg Intravenous Q12H   Continuous Infusions:    . DISCONTD: lactated ringers 50 mL/hr at 12/05/11 2211   PRN Meds:.acetaminophen, acetaminophen, metoCLOPramide (REGLAN) injection, metoCLOPramide, ondansetron (ZOFRAN) IV, ondansetron, oxyCODONE-acetaminophen, traZODone, zolpidem  Assessment/Plan:  [name] is a 46 year old male with a history of poorly controlled type II DM s/p multiple toe amputations, HTN, tobacco abuse, CKD stage 2, who presents with left second toe wound with pain, foul smelling discharge, and associated leg swelling.  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 Adm WBC = 12.8. Afebrile. X-ray of 2nd left toe revealed interval dislocation (since 11/10/2011) of PIP joint. LE doppler negative for DVT, blood cultures NGTD *MRI with probable osteomyelitis  -s/p surgical amputation of 2nd adn 3rd L toes and 1/2 metatarsal 12/06/11. Specimen sent for pathology. - will d/c with Unasyn 7d, reassess at ortho f/u - continue gabapentin  - percocet PRN for pain - PT: no needs at home, weight bearing restrictions - f/u with ortho  Dysuria -patient with subjective dysuria. UA without leukocytes or RBCs.  Afebrile. Will continue to monitor.  Tobacco abuse - Current smoker, 1/2 packs per day.  - nicotine patch  -encourage cessation, pt wants to stop, especially to enhance would healing.  Pruritic lesion in left axilla - likely dermatophytosis, improved with antifungal cream. Scaling/flaking also present.  - clotrimazole cream   Diabetes mellitus - poor compliance at  home with insulin regimen (lantus 40U AM, 34U evening), with reported hypoglycemic episodes on past admissions. Will therefore exercise caution in glycemic control.  *CBGs high over night (300s-439), glucose on UA - home regimen was not started yesterday 2/2 verbal ready of low CBG, however not documented in medical record. High CBGs over night. Will start home dose this morning. - SSI  -carb modified diet, no juices  Chronic Kidney Disease, stage 2: baseline Cr appears to be about 1.3-1.4 -Patient is at baseline -Continue to monitor  Hypertension - on accuretic. Non-compliant 2/2 financial strain, will hold on resuming dual therapy at this time.  - HCTZ 25mg    Depression/Insomnia - Patient has been on prozac for two weeks, with no change in depressive symptoms. Patient aware medication may take 4-6 weeks to exert effects. Denies worsened depression.  - continue prozac  -Ambien prn  DVT ppx - heparin  [place], no transportation. Uses firewood for heat during the winter. He has qualified for disability but will not become active until November. -CSW to help with bus pass for follow up appointments if needed at d/c -awaiting SNF/ALF   LOS: 3 days   [name] 12/07/2011, 10:25 AM

## 2011-12-08 DIAGNOSIS — I129 Hypertensive chronic kidney disease with stage 1 through stage 4 chronic kidney disease, or unspecified chronic kidney disease: Secondary | ICD-10-CM

## 2011-12-08 DIAGNOSIS — N182 Chronic kidney disease, stage 2 (mild): Secondary | ICD-10-CM

## 2011-12-08 LAB — GLUCOSE, CAPILLARY
Glucose-Capillary: 154 mg/dL — ABNORMAL HIGH (ref 70–99)
Glucose-Capillary: 172 mg/dL — ABNORMAL HIGH (ref 70–99)
Glucose-Capillary: 188 mg/dL — ABNORMAL HIGH (ref 70–99)

## 2011-12-08 MED ORDER — AMOXICILLIN-POT CLAVULANATE 500-125 MG PO TABS: 1.0000 | ORAL_TABLET | Freq: Three times a day (TID) | ORAL | Status: DC

## 2011-12-08 MED ORDER — AMOXICILLIN-POT CLAVULANATE 500-125 MG PO TABS
1.0000 | ORAL_TABLET | Freq: Three times a day (TID) | ORAL | Status: DC
  Administered 2011-12-08: 500 mg via ORAL
  Filled 2011-12-08 (×4): qty 1

## 2011-12-08 MED ORDER — OXYCODONE-ACETAMINOPHEN 5-325 MG PO TABS: 1.0000 | ORAL_TABLET | Freq: Four times a day (QID) | ORAL | Status: AC | PRN

## 2011-12-08 NOTE — Progress Notes (Signed)
Subjective:  [name] is doing well this morning, no complaints. He is awaiting placement and follow up plans. No fever or chills, no drainage from his feet.  Objective: Vital signs in last 24 hours: Filed Vitals:   12/07/11 2108 12/07/11 2112 12/08/11 0626 12/08/11 0900  BP:  154/103 146/92 164/101  Pulse: 113 114 99 105  Temp: 99.6 F (37.6 C)  98.5 F (36.9 C) 98.6 F (37 C)  TempSrc: Oral  Oral Oral  Resp: 18 18 17 18   Height:      Weight:      SpO2: 100%  100% 100%   Weight change:   Intake/Output Summary (Last 24 hours) at 12/08/11 0958 Last data filed at 12/08/11 0900  Gross per 24 hour  Intake    480 ml  Output      1 ml  Net    479 ml    Physical Exam Blood pressure 164/101, pulse 105, temperature 98.6 F (37 C), temperature source Oral, resp. rate 18, height 5\' 8"  (1.727 m), weight 206 lb 5.6 oz (93.6 kg), SpO2 100.00%. General:  No acute distress, alert and oriented x 3, well-appearing  HEENT:  PERRL, EOMI, no lymphadenopathy, moist mucous membranes Cardiovascular:  Regular rate and rhythm, no murmurs, rubs or gallops Respiratory:  Clear to auscultation bilaterally, no wheezes, rales, or rhonchi Abdomen:  Soft, nondistended, nontender, normoactive bowel sounds Extremities:  L foot wrapped in bandage and boot. Did not remove. Exposed skin is warm, no edema, appears well perfused. No drainage from bandage. Skin: scaling, flaky skin in L axilla Neuro: Not anxious appearing, no depressed mood, normal affect  Lab Results: CBC    Component Value Date/Time   WBC 10.7* 12/04/2011 0831   RBC 3.69* 12/04/2011 0831   HGB 10.9* 12/04/2011 0831   HCT 31.9* 12/04/2011 0831   PLT 394 12/04/2011 0831   MCV 86.4 12/04/2011 0831   MCH 29.5 12/04/2011 0831   MCHC 34.2 12/04/2011 0831   RDW 12.6 12/04/2011 0831   LYMPHSABS 2.8 12/03/2011 2307   MONOABS 1.0 12/03/2011 2307   EOSABS 0.3 12/03/2011 2307   BASOSABS 0.0 12/03/2011 2307    BMET    Component Value Date/Time   NA  128* 12/06/2011 0600   K 4.1 12/06/2011 0600   CL 94* 12/06/2011 0600   CO2 23 12/06/2011 0600   GLUCOSE 387* 12/06/2011 1010   BUN 11 12/06/2011 0600   CREATININE 1.31 12/06/2011 0600   CREATININE 1.43* 09/05/2011 1402   CALCIUM 9.0 12/06/2011 0600   GFRNONAA 64* 12/06/2011 0600   GFRAA 74* 12/06/2011 0600     Micro Results: Recent Results (from the past 240 hour(s))  CULTURE, BLOOD (ROUTINE X 2)     Status: Normal (Preliminary result)   Collection Time   12/04/11  2:40 AM      Component Value Range Status Comment   Specimen Description BLOOD RIGHT HAND   Final    Special Requests BOTTLES DRAWN AEROBIC ONLY 10CC   Final    Culture  Setup Time 12/04/2011 08:46   Final    Culture     Final    Value:        BLOOD CULTURE RECEIVED NO GROWTH TO DATE CULTURE WILL BE HELD FOR 5 DAYS BEFORE ISSUING A FINAL NEGATIVE REPORT   Report Status PENDING   Incomplete   CULTURE, BLOOD (ROUTINE X 2)     Status: Normal (Preliminary result)   Collection Time   12/04/11  2:50 AM  Component Value Range Status Comment   Specimen Description BLOOD LEFT ARM   Final    Special Requests BOTTLES DRAWN AEROBIC AND ANAEROBIC 10CC EA   Final    Culture  Setup Time 12/04/2011 08:46   Final    Culture     Final    Value:        BLOOD CULTURE RECEIVED NO GROWTH TO DATE CULTURE WILL BE HELD FOR 5 DAYS BEFORE ISSUING A FINAL NEGATIVE REPORT   Report Status PENDING   Incomplete   MRSA PCR SCREENING     Status: Normal   Collection Time   12/04/11  6:03 AM      Component Value Range Status Comment   MRSA by PCR NEGATIVE  NEGATIVE Final   SURGICAL PCR SCREEN     Status: Abnormal   Collection Time   12/05/11  4:59 PM      Component Value Range Status Comment   MRSA, PCR NEGATIVE  NEGATIVE Final    Staphylococcus aureus POSITIVE (*) NEGATIVE Final     Studies/Results: No results found.  Medications: medications reviewed Scheduled Meds:    . clotrimazole   Topical BID  . FLUoxetine  20 mg Oral Daily  .  gabapentin  600 mg Oral BID  . heparin  5,000 Units Subcutaneous Q12H  . hydrochlorothiazide  25 mg Oral Daily  . insulin aspart  0-15 Units Subcutaneous TID WC  . insulin aspart  0-5 Units Subcutaneous QHS  . insulin aspart protamine-insulin aspart  34 Units Subcutaneous Q supper   And  . insulin aspart protamine-insulin aspart  40 Units Subcutaneous Q breakfast  . nicotine  21 mg Transdermal Daily  . pantoprazole  80 mg Oral Q1200  . pneumococcal 23 valent vaccine  0.5 mL Intramuscular Tomorrow-1000   Continuous Infusions:   PRN Meds:.acetaminophen, acetaminophen, metoCLOPramide (REGLAN) injection, metoCLOPramide, ondansetron (ZOFRAN) IV, ondansetron, oxyCODONE-acetaminophen, traZODone, zolpidem  Assessment/Plan:  [name] is a 46 year old male with a history of poorly controlled type II DM s/p multiple toe amputations, HTN, tobacco abuse, CKD stage 2, who presents with left second toe wound with pain, foul smelling discharge, and associated leg swelling.  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 Adm WBC = 12.8. Afebrile. X-ray of 2nd left toe revealed interval dislocation (since 11/10/2011) of PIP joint. LE doppler negative for DVT, blood cultures NGTD *MRI with probable osteomyelitis  -s/p surgical amputation of 2nd adn 3rd L toes and 1/2 metatarsal 12/06/11. Specimen sent for pathology. - will d/c with Augmentin, reassess at ortho f/u - continue gabapentin  - percocet PRN for pain - PT: no needs at home, weight bearing restrictions - f/u with ortho  Dysuria -resolved, not complaining of dysuria. UA without leukocytes or RBCs. Afebrile. Will continue to monitor.  Tobacco abuse - Current smoker, 1/2 packs per day.  - nicotine patch  -encourage cessation, pt wants to stop, especially to enhance would healing.  Pruritic lesion in left axilla - likely dermatophytosis, improved with antifungal cream. Scaling/flaking also present.  - clotrimazole cream   Diabetes mellitus -  poor compliance at home with insulin regimen (lantus 40U AM, 34U evening), with reported hypoglycemic episodes on past admissions. Will therefore exercise caution in glycemic control.  *CBGs high over night (300s-439), glucose on UA - home regimen was not started yesterday 2/2 verbal ready of low CBG, however not documented in medical record. CBGs 71-188 overnight on home dose - SSI  -carb modified diet, no juices  Chronic Kidney Disease, stage  2: baseline Cr appears to be about 1.3-1.4 -Patient is at baseline -Continue to monitor  Hypertension - on home accuretic nit on-compliant 2/2 financial strain, will hold on resuming dual therapy at this time.  - HCTZ 25mg    Depression/Insomnia - Patient has been on prozac for two weeks, with no change in depressive symptoms. Patient aware medication may take 4-6 weeks to exert effects. Denies worsened depression.  - continue prozac  -Ambien prn  DVT ppx - heparin  [place] situation [place] or [place], no transportation. Uses firewood for heat during the winter. He has qualified for disability but will not become active until November. -CSW to help with bus pass for follow up appointments if needed at d/c -awaiting ALF   LOS: 4 days   [place] Ar 12/08/2011, 9:58 AM

## 2011-12-08 NOTE — Progress Notes (Signed)
Clinical Social Work-CSW confirmed placement with [facility] with letter of Guarantee and medicaid pending-pt will d/c home with pick up belongings and will then be transported to [facility] by friend. CSW has faxed all d/c paperwork to ALF and will provide cab voucher home- No further needs at this time- Jodean Lima, [phone]

## 2011-12-08 NOTE — Progress Notes (Signed)
Patient discharged to [facility] via cab to home first. Paperwork and prescription sent with patient. IV d/c'd with cath intact. Assessment unchanged from this am.

## 2011-12-10 LAB — CULTURE, BLOOD (ROUTINE X 2)

## 2011-12-14 ENCOUNTER — Telehealth: Payer: Self-pay | Admitting: *Deleted

## 2011-12-14 NOTE — Telephone Encounter (Signed)
Pt called stating he is in an Assisted Living - [facility] His CBG's have been dropping into the 50's the past few days and he is not able to get more food without your permission. He is asking you to write a letter to [facility] requesting double portions of food at meals.  Pt # [phone]

## 2011-12-15 ENCOUNTER — Telehealth: Payer: Self-pay | Admitting: Internal Medicine

## 2011-12-15 NOTE — Telephone Encounter (Signed)
I tried to call Mr [name] in regard to his request to ask the Assisted living facility to increase his food portions, but I could not get him on the phone. I left a voice mail for him to page me on # [phone].

## 2011-12-15 NOTE — Telephone Encounter (Signed)
Pt calls today and states he needs an order to give himself his insulin injections, you may call him at [phone] or [phone] and ask for him by name.

## 2011-12-20 ENCOUNTER — Ambulatory Visit (INDEPENDENT_AMBULATORY_CARE_PROVIDER_SITE_OTHER): Payer: Medicaid Other | Admitting: Internal Medicine

## 2011-12-20 ENCOUNTER — Telehealth: Payer: Self-pay | Admitting: *Deleted

## 2011-12-20 ENCOUNTER — Encounter: Payer: Self-pay | Admitting: Internal Medicine

## 2011-12-20 VITALS — BP 150/96 | HR 100 | Temp 97.3°F | Ht 70.5 in | Wt 219.2 lb

## 2011-12-20 DIAGNOSIS — IMO0002 Reserved for concepts with insufficient information to code with codable children: Secondary | ICD-10-CM

## 2011-12-20 DIAGNOSIS — I798 Other disorders of arteries, arterioles and capillaries in diseases classified elsewhere: Secondary | ICD-10-CM

## 2011-12-20 DIAGNOSIS — F32A Depression, unspecified: Secondary | ICD-10-CM

## 2011-12-20 DIAGNOSIS — I10 Essential (primary) hypertension: Secondary | ICD-10-CM

## 2011-12-20 DIAGNOSIS — E1351 Other specified diabetes mellitus with diabetic peripheral angiopathy without gangrene: Secondary | ICD-10-CM

## 2011-12-20 DIAGNOSIS — F329 Major depressive disorder, single episode, unspecified: Secondary | ICD-10-CM

## 2011-12-20 MED ORDER — INSULIN LISPRO PROT & LISPRO (75-25 MIX) 100 UNIT/ML ~~LOC~~ SUSP: SUBCUTANEOUS | Status: DC

## 2011-12-20 MED ORDER — FLUOXETINE HCL 10 MG PO CAPS: 10.0000 mg | ORAL_CAPSULE | Freq: Every day | ORAL | Status: DC

## 2011-12-20 MED ORDER — CARVEDILOL 6.25 MG PO TABS: 6.2500 mg | ORAL_TABLET | Freq: Two times a day (BID) | ORAL | Status: DC

## 2011-12-20 NOTE — Patient Instructions (Addendum)
1.  Change your insulin to Insulin 75/25.  Take 40 units with breakfast and 34 units with supper.  Check your blood sugar before you eat and if your blood sugars is <150 subtract 5 units.  2.  Start Coreg 6.25 mg.  Take 1 tablet twice daily for your blood pressure  3.  Follow up in 2 weeks to see how your blood pressure.

## 2011-12-20 NOTE — Telephone Encounter (Signed)
Call from Lurena Joiner at [facility] _ # [phone] Requesting appointment because pt reports CBG's are running low.  State yesterday cbg was 38 in AM. Pt on insulin.  Will see today.  Asked to bring meter

## 2011-12-20 NOTE — Progress Notes (Signed)
Subjective:   Patient ID: [name] male   DOB: Nov 27, 1965 46 y.o.   MRN: [id number]  HPI: [name] is a 46 y.o. man who presents to clinic today for follow up of his chronic medical conditions.  See Problem focused Assessment and Plan for full details.   Past Medical History  Diagnosis Date  . Onychomycosis   . Hypertension   . Diabetic peripheral neuropathy   . Diabetic foot ulcer 12/09    left plantar area  . History of drug abuse     Cocaine and marijuana  . Exposure to trichomonas     treated empirically  . Cellulitis of left foot 03/2008    left 4th and 5th metatarsal area  . Hyperlipidemia   . 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 Ulcerative esophagitis 2007    severe, complicated with UGI bleed  . Mallory Janann August tear April 2009  . History of prolonged Q-T interval on ECG   . 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  . 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 Chronic renal insufficiency   . Depression   . Peripheral vascular disease   . Type II diabetes mellitus   . GERD (gastroesophageal reflux disease)    Current Outpatient Prescriptions  Medication Sig Dispense Refill  . clotrimazole (LOTRIMIN) 1 % cream Apply topically 2 (two) times daily.  30 g  0  . FLUoxetine (PROZAC) 20 MG capsule Take 1 capsule (20 mg total) by mouth daily.  30 capsule  3  . gabapentin (NEURONTIN) 300 MG capsule Take 2 capsules (600 mg total) by mouth 2 (two) times daily.  60 capsule  3  . insulin aspart protamine-insulin aspart (NOVOLOG MIX 70/30) (70-30) 100 UNIT/ML injection Subcutaneously inject 40 units in the morning and 34 units before the evening meal  10 mL  6  . nicotine (NICODERM CQ - DOSED IN MG/24 HOURS) 21 mg/24hr patch Place 1 patch onto the skin daily.  28 patch  0  . omeprazole (PRILOSEC) 40 MG capsule Take 1 capsule (40 mg total) by mouth daily. You can find this medication in generic form  available over-the-counter.  30 capsule  3  . quinapril-hydrochlorothiazide (ACCURETIC) 20-12.5 MG per tablet Take 2 tablets by mouth daily.  60 tablet  6   No family history on file. History   Social History  . Marital Status: Married    Spouse Name: N/A    Number of Children: N/A  . Years of Education: N/A   Social History Main Topics  . Smoking status: Current Every Day Smoker -- 0.5 packs/day for 34 years    Types: Cigarettes  . Smokeless tobacco: Never Used   Comment: Has stopped 2 weeks ago- QUIT line info given to pt and wife for asst with smoking patchers, etc  . Alcohol Use: Yes     12/05/2011 "occasionally; maybe New Years"  . Drug Use: Yes    Special: Marijuana     12/05/2011 "last time ~ 2 wk ago"  . Sexually Active: Yes   Other Topics Concern  . None   Social History Narrative  . None   Review of Systems: Constitutional: Denies fever, chills, diaphoresis, appetite change and fatigue.  HEENT: Denies photophobia, eye pain, redness, hearing loss, ear pain, congestion, sore throat, rhinorrhea, sneezing, mouth sores, trouble swallowing, neck pain, neck stiffness and tinnitus.   Respiratory: Denies SOB, DOE, cough, chest tightness,  and wheezing.   Cardiovascular: Denies chest pain, palpitations and leg swelling.  Gastrointestinal: Denies nausea, vomiting, abdominal pain, diarrhea, constipation, blood in stool and abdominal distention.  Genitourinary: Denies dysuria, urgency, frequency, hematuria, flank pain and difficulty urinating.  Musculoskeletal: Denies myalgias, back pain, joint swelling, arthralgias and gait problem.  Skin: Denies pallor, rash and wound.  Neurological: Denies dizziness, seizures, syncope, weakness, light-headedness, numbness and headaches.  Hematological: Denies adenopathy. Easy bruising, personal or family bleeding history  Psychiatric/Behavioral: Denies suicidal ideation, mood changes, confusion, nervousness, sleep disturbance and  agitation  Objective:  Physical Exam: Filed Vitals:   12/20/11 1509  BP: 150/96  Pulse: 100  Temp: 97.3 F (36.3 C)  TempSrc: Oral  Height: 5' 10.5" (1.791 m)  Weight: 219 lb 3.2 oz (99.428 kg)  SpO2: 98%   Constitutional: Vital signs reviewed.  Patient is a well-developed and well-nourished man in no acute distress and cooperative with exam. Alert and oriented x3.  Head: Normocephalic and atraumatic Ear: TM normal bilaterally Mouth: no erythema or exudates, MMM Eyes: PERRL, EOMI, conjunctivae normal, No scleral icterus.  Neck: Supple, Trachea midline normal ROM, No JVD, mass, thyromegaly, or carotid bruit present.  Cardiovascular: RRR, S1 normal, S2 normal, no MRG, pulses symmetric and intact bilaterally Pulmonary/Chest: CTAB, no wheezes, rales, or rhonchi Abdominal: Soft. Non-tender, non-distended, bowel sounds are normal, no masses, organomegaly, or guarding present.  GU: no CVA tenderness Musculoskeletal: No joint deformities, erythema, or stiffness, ROM full and no nontender Hematology: no cervical, inginal, or axillary adenopathy.  Neurological: A&O x3, Strength is normal and symmetric bilaterally, cranial nerve II-XII are grossly intact, no focal motor deficit, sensory intact to light touch bilaterally.  Skin: Warm, dry and intact. No rash, cyanosis, or clubbing.  Psychiatric: Normal mood and affect. speech and behavior is normal. Judgment and thought content normal. Cognition and memory are normal.   Assessment & Plan:

## 2011-12-22 ENCOUNTER — Emergency Department (HOSPITAL_COMMUNITY)
Admission: EM | Admit: 2011-12-22 | Discharge: 2011-12-22 | Payer: Medicaid Other | Attending: Emergency Medicine | Admitting: Emergency Medicine

## 2011-12-22 ENCOUNTER — Other Ambulatory Visit: Payer: Self-pay

## 2011-12-22 ENCOUNTER — Encounter (HOSPITAL_COMMUNITY): Payer: Self-pay

## 2011-12-22 DIAGNOSIS — E162 Hypoglycemia, unspecified: Secondary | ICD-10-CM

## 2011-12-22 DIAGNOSIS — E785 Hyperlipidemia, unspecified: Secondary | ICD-10-CM | POA: Insufficient documentation

## 2011-12-22 DIAGNOSIS — F17200 Nicotine dependence, unspecified, uncomplicated: Secondary | ICD-10-CM | POA: Insufficient documentation

## 2011-12-22 DIAGNOSIS — I10 Essential (primary) hypertension: Secondary | ICD-10-CM | POA: Insufficient documentation

## 2011-12-22 DIAGNOSIS — K219 Gastro-esophageal reflux disease without esophagitis: Secondary | ICD-10-CM | POA: Insufficient documentation

## 2011-12-22 DIAGNOSIS — Z794 Long term (current) use of insulin: Secondary | ICD-10-CM | POA: Insufficient documentation

## 2011-12-22 DIAGNOSIS — E1169 Type 2 diabetes mellitus with other specified complication: Secondary | ICD-10-CM | POA: Insufficient documentation

## 2011-12-22 LAB — COMPREHENSIVE METABOLIC PANEL
Albumin: 3.3 g/dL — ABNORMAL LOW (ref 3.5–5.2)
BUN: 19 mg/dL (ref 6–23)
Calcium: 9.7 mg/dL (ref 8.4–10.5)
Creatinine, Ser: 1.43 mg/dL — ABNORMAL HIGH (ref 0.50–1.35)
GFR calc Af Amer: 67 mL/min — ABNORMAL LOW (ref >90)
Glucose, Bld: 110 mg/dL — ABNORMAL HIGH (ref 70–99)
Potassium: 3.7 mEq/L (ref 3.5–5.1)
Total Protein: 7.8 g/dL (ref 6.0–8.3)

## 2011-12-22 LAB — URINALYSIS, ROUTINE W REFLEX MICROSCOPIC
Glucose, UA: 100 mg/dL — AB
Leukocytes, UA: NEGATIVE
Nitrite: NEGATIVE
Specific Gravity, Urine: 1.01 (ref 1.005–1.030)
pH: 6.5 (ref 5.0–8.0)

## 2011-12-22 LAB — GLUCOSE, CAPILLARY
Glucose-Capillary: 43 mg/dL — CL (ref 70–99)
Glucose-Capillary: 159 mg/dL — ABNORMAL HIGH (ref 70–99)
Glucose-Capillary: 197 mg/dL — ABNORMAL HIGH (ref 70–99)

## 2011-12-22 LAB — CBC WITH DIFFERENTIAL/PLATELET
Basophils Relative: 1 % (ref 0–1)
Eosinophils Absolute: 0.5 10*3/uL (ref 0.0–0.7)
Eosinophils Relative: 4 % (ref 0–5)
Hemoglobin: 10.8 g/dL — ABNORMAL LOW (ref 13.0–17.0)
MCH: 29 pg (ref 26.0–34.0)
MCHC: 32 g/dL (ref 30.0–36.0)
MCV: 90.6 fL (ref 78.0–100.0)
Monocytes Relative: 6 % (ref 3–12)
Neutrophils Relative %: 64 % (ref 43–77)

## 2011-12-22 MED ORDER — DEXTROSE 50 % IV SOLN: 50.0000 mL | Freq: Once | INTRAVENOUS | Status: DC | PRN

## 2011-12-22 MED ORDER — GLUCAGON HCL (RDNA) 1 MG IJ SOLR: 2.0000 mg | Freq: Once | INTRAMUSCULAR | Status: DC

## 2011-12-22 MED ORDER — DEXTROSE 50 % IV SOLN
INTRAVENOUS | Status: AC
  Filled 2011-12-22: qty 50

## 2011-12-22 MED ORDER — KCL IN DEXTROSE-NACL 20-5-0.45 MEQ/L-%-% IV SOLN
Freq: Once | INTRAVENOUS | Status: AC
  Administered 2011-12-22: 13:00:00 via INTRAVENOUS
  Filled 2011-12-22: qty 1000

## 2011-12-22 MED ORDER — DEXTROSE 50 % IV SOLN
INTRAVENOUS | Status: AC
  Administered 2011-12-22: 50 mL
  Filled 2011-12-22: qty 50

## 2011-12-22 NOTE — ED Notes (Addendum)
PA at bedside. [place].

## 2011-12-22 NOTE — ED Notes (Signed)
Pt given a warm lunch meal tray.

## 2011-12-22 NOTE — ED Notes (Signed)
Upon arrival to ED, pt appeared drowsy, quick to fall asleep but easily aroused. Pt diaphoretic.

## 2011-12-22 NOTE — ED Notes (Signed)
Per [facility], Pt was unresponsive and diaphoretic at his orthopaedic office. Initial FSBS . 1 amp D50 at 1036. Rechecked glucose 107. Glucose at 1105 was 77.

## 2011-12-22 NOTE — ED Notes (Signed)
MD at bedside. Dr. Lajean Saver.

## 2011-12-22 NOTE — ED Notes (Signed)
CBG=159  

## 2011-12-22 NOTE — ED Notes (Signed)
Pt given juice, sandwich, cheese, applesauce, graham crackers, peanut butter. Pt sitting up on stretcher eating. Tolerating well.

## 2011-12-22 NOTE — ED Provider Notes (Signed)
Medical screening examination/treatment/procedure(s) were conducted as a shared visit with non-physician practitioner(s) and myself.  I personally evaluated the patient during the encounter  From ECF with AMS and hypoglycemia. Awake and alert on my assessment after receiving sugar. CN 2-12 intact. No focal deficits.  Glynn Octave, MD 12/22/11 1739

## 2011-12-22 NOTE — ED Provider Notes (Signed)
History     CSN: [id number]  Arrival date & time 12/22/11  1107   First MD Initiated Contact with Patient 12/22/11 1113      Chief Complaint  Patient presents with  . Hypoglycemia    (Consider location/radiation/quality/duration/timing/severity/associated sxs/prior treatment) The history is provided by the patient, the EMS personnel and a significant other.    46 y.o. male brought in by EMS for hypoglycemia from [facility] skilled nursing facility. Initially fingerstick was 17; EMS gave an amp of D50 in route and it raised to 140s. Patient is lethargic, oriented to self and city only and diaphoretic. Patient was in [facility] for rehabilitation after toe amputation several weeks ago. Patient's significant other states that he's been having issues controlling his sugar while at the skilled nursing facility. Level V caveat secondary to altered mental status.  Past Medical History  Diagnosis Date  . Onychomycosis   . Hypertension   . Diabetic peripheral neuropathy   . Diabetic foot ulcer 12/09    left plantar area  . History of drug abuse     Cocaine and marijuana  . Exposure to trichomonas     treated empirically  . Cellulitis of left foot 03/2008    left 4th and 5th metatarsal area  . Hyperlipidemia   . 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 Ulcerative esophagitis 2007    severe, complicated with UGI bleed  . Mallory Janann August tear April 2009  . History of prolonged Q-T interval on ECG   . 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  . 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 Chronic renal insufficiency   . Depression   . Peripheral vascular disease   . Type II diabetes mellitus   . GERD (gastroesophageal reflux disease)     Past Surgical History  Procedure Date  . Toe amputation 06/2011    left; 4th toe  . Nephrectomy     R side  . I&d extremity 08/24/2011    Procedure: IRRIGATION AND DEBRIDEMENT  EXTREMITY;  Surgeon: Cammy Copa, MD;  Location: [facility] OR;  Service: Orthopedics;  Laterality: Left;  . Amputation 08/24/2011    Procedure: AMPUTATION RAY;  Surgeon: Cammy Copa, MD;  Location: [facility] OR;  Service: Orthopedics;  Laterality: Left;  left fourth toe Ray Resection  . Amputation 12/05/2011    Procedure: AMPUTATION RAY;  Surgeon: Cammy Copa, MD;  Location: [facility] OR;  Service: Orthopedics;  Laterality: Left;    History reviewed. No pertinent family history.  History  Substance Use Topics  . Smoking status: Current Every Day Smoker -- 0.5 packs/day for 34 years    Types: Cigarettes  . Smokeless tobacco: Never Used   Comment: Has stopped 2 weeks ago- QUIT line info given to pt and wife for asst with smoking patchers, etc  . Alcohol Use: Yes     12/05/2011 "occasionally; maybe New Years"      Review of Systems  Unable to perform ROS: Mental status change    Allergies  Review of patient's allergies indicates no known allergies.  Home Medications   Current Outpatient Rx  Name Route Sig Dispense Refill  . CARVEDILOL 6.25 MG PO TABS Oral Take 1 tablet (6.25 mg total) by mouth 2 (two) times daily with a meal. 60 tablet 3  . CLOTRIMAZOLE 1 % EX CREA Topical Apply topically 2 (two) times daily. 30 g 0  . FLUOXETINE HCL  10 MG PO CAPS Oral Take 1 capsule (10 mg total) by mouth daily. 30 capsule 3  . GABAPENTIN 300 MG PO CAPS Oral Take 2 capsules (600 mg total) by mouth 2 (two) times daily. 60 capsule 3  . INSULIN LISPRO PROT & LISPRO (75-25) 100 UNIT/ML [place] SUSP  Take 40 units with breakfast and 34 Units at supper time.  Take 15 minutes before eating. Please check the blood sugar before giving.  If blood sugar <150 please subtract 5 units. 10 mL 12  . NICOTINE 21 MG/24HR TD PT24 Transdermal Place 1 patch onto the skin daily. 28 patch 0  . OMEPRAZOLE 40 MG PO CPDR Oral Take 1 capsule (40 mg total) by mouth daily. You can find this medication in generic form available  over-the-counter. 30 capsule 3  . QUINAPRIL-HYDROCHLOROTHIAZIDE 20-12.5 MG PO TABS Oral Take 2 tablets by mouth daily. 60 tablet 6    BP 179/106  Pulse 79  Temp 97.4 F (36.3 C) (Axillary)  Resp 13  Ht 5\' 8"  (1.727 m)  Wt 219 lb (99.338 kg)  BMI 33.30 kg/m2  SpO2 98%  Physical Exam  Nursing note and vitals reviewed. Constitutional: He appears well-developed and well-nourished. No distress.       Diaphoretic  HENT:  Head: Normocephalic and atraumatic.  Mouth/Throat: Oropharynx is clear and moist.  Eyes: Conjunctivae normal and EOM are normal. Pupils are equal, round, and reactive to light.  Neck: Normal range of motion. No JVD present.  Cardiovascular: Normal rate, regular rhythm and normal heart sounds.   Pulmonary/Chest: Effort normal and breath sounds normal. No stridor. No respiratory distress. He has no wheezes. He has no rales. He exhibits no tenderness.  Abdominal: Soft. Bowel sounds are normal. He exhibits no distension and no mass. There is no tenderness. There is no rebound and no guarding.  Musculoskeletal: Normal range of motion.  Neurological:       Patient is obtunded but arousable. States that he feels bad. GCS is 12: [id number]  Skin: Skin is warm.  Psychiatric: He has a normal mood and affect.    ED Course  Procedures (including critical care time)  Labs Reviewed  CBC WITH DIFFERENTIAL - Abnormal; Notable for the following:    WBC 10.7 (*)     RBC 3.73 (*)     Hemoglobin 10.8 (*)     HCT 33.8 (*)     Platelets 438 (*)     All other components within normal limits  COMPREHENSIVE METABOLIC PANEL - Abnormal; Notable for the following:    Glucose, Bld 110 (*)     Creatinine, Ser 1.43 (*)     Albumin 3.3 (*)     Total Bilirubin 0.2 (*)     GFR calc non Af Amer 57 (*)     GFR calc Af Amer 67 (*)     All other components within normal limits  URINALYSIS, ROUTINE W REFLEX MICROSCOPIC - Abnormal; Notable for the following:    Glucose, UA 100 (*)     All other  components within normal limits  GLUCOSE, CAPILLARY - Abnormal; Notable for the following:    Glucose-Capillary 43 (*)     All other components within normal limits  GLUCOSE, CAPILLARY - Abnormal; Notable for the following:    Glucose-Capillary 140 (*)     All other components within normal limits  GLUCOSE, CAPILLARY - Abnormal; Notable for the following:    Glucose-Capillary 159 (*)     All other components within  normal limits  GLUCOSE, CAPILLARY - Abnormal; Notable for the following:    Glucose-Capillary 197 (*)     All other components within normal limits   No results found.   Date: 12/22/2011  Rate: 79  Rhythm: normal sinus rhythm  QRS Axis: indeterminate  Intervals: Slight prolongation of QTCc at 477 ms  ST/T Wave abnormalities: normal  Conduction Disutrbances:none  Narrative Interpretation:   Old EKG Reviewed: unchanged  1. Hypoglycemia       MDM  46 y.o. male with hypoglycemia from skilled nursing facility after toe amputation several weeks ago.  patient is not taking any oral hypoglycemics. Initially patient is diaphoretic with altered mental status amp of 50 given and D5 half-normal saline drip initiated  12:17 PM patient mentating better no longer diaphoretic more alert and eating. Patient states he feels much better at this point.  Now the patient's mentation has improved he states that his insulin formulation has changed recently in the last day from 70/30 to 70/25. He also states that he hasn't been eating as he normally would with the poor quality and amount of food at the SNF.  Patient is showing consistently stable blood glucose well over 100. Likely cause of his hypoglycemia is incorrect administration of insulin. Creatinine level is 1.43 which is not out of the norm for him I doubt this is an issue with clearance of the insulin. Urinalysis is clean chest x-ray is normal EKG shows no acute changes and electrolytes are otherwise unremarkable.  Patient is  significantly improved and stable for DC back to skilled nursing facility.    Pt verbalized understanding and agrees with care plan. Outpatient follow-up and return precautions given.        Wynetta Emery, PA-C 12/22/11 1550

## 2011-12-26 NOTE — Telephone Encounter (Signed)
MD called pt at home

## 2012-01-03 ENCOUNTER — Ambulatory Visit (INDEPENDENT_AMBULATORY_CARE_PROVIDER_SITE_OTHER): Payer: Self-pay | Admitting: Internal Medicine

## 2012-01-03 ENCOUNTER — Encounter: Payer: Self-pay | Admitting: Internal Medicine

## 2012-01-03 VITALS — BP 153/90 | HR 92 | Temp 98.0°F | Ht 68.0 in | Wt 227.5 lb

## 2012-01-03 DIAGNOSIS — E1351 Other specified diabetes mellitus with diabetic peripheral angiopathy without gangrene: Secondary | ICD-10-CM

## 2012-01-03 DIAGNOSIS — L089 Local infection of the skin and subcutaneous tissue, unspecified: Secondary | ICD-10-CM

## 2012-01-03 DIAGNOSIS — R600 Localized edema: Secondary | ICD-10-CM

## 2012-01-03 DIAGNOSIS — E11649 Type 2 diabetes mellitus with hypoglycemia without coma: Secondary | ICD-10-CM

## 2012-01-03 DIAGNOSIS — L97509 Non-pressure chronic ulcer of other part of unspecified foot with unspecified severity: Secondary | ICD-10-CM

## 2012-01-03 DIAGNOSIS — E1169 Type 2 diabetes mellitus with other specified complication: Secondary | ICD-10-CM

## 2012-01-03 DIAGNOSIS — N182 Chronic kidney disease, stage 2 (mild): Secondary | ICD-10-CM

## 2012-01-03 DIAGNOSIS — I10 Essential (primary) hypertension: Secondary | ICD-10-CM

## 2012-01-03 DIAGNOSIS — E119 Type 2 diabetes mellitus without complications: Secondary | ICD-10-CM

## 2012-01-03 LAB — CBC
Hemoglobin: 10.9 g/dL — ABNORMAL LOW (ref 13.0–17.0)
MCV: 86.5 fL (ref 78.0–100.0)
RBC: 3.77 MIL/uL — ABNORMAL LOW (ref 4.22–5.81)

## 2012-01-03 LAB — BASIC METABOLIC PANEL
Calcium: 9.3 mg/dL (ref 8.4–10.5)
Creat: 1.52 mg/dL — ABNORMAL HIGH (ref 0.50–1.35)
Glucose, Bld: 112 mg/dL — ABNORMAL HIGH (ref 70–99)

## 2012-01-03 LAB — GLUCOSE, CAPILLARY: Glucose-Capillary: 118 mg/dL — ABNORMAL HIGH (ref 70–99)

## 2012-01-03 MED ORDER — INSULIN LISPRO PROT & LISPRO (75-25 MIX) 100 UNIT/ML ~~LOC~~ SUSP: SUBCUTANEOUS | Status: DC

## 2012-01-03 MED ORDER — DOXYCYCLINE HYCLATE 100 MG PO TABS: 100.0000 mg | ORAL_TABLET | Freq: Two times a day (BID) | ORAL | Status: AC

## 2012-01-03 MED ORDER — AMLODIPINE BESYLATE 5 MG PO TABS: 5.0000 mg | ORAL_TABLET | Freq: Every day | ORAL | Status: DC

## 2012-01-03 NOTE — Assessment & Plan Note (Signed)
The patient reports episodes of hypoglycemia with his current insulin regimen of 40 international units in the morning and 36 international units at bedtime. Recently he was seen in the ED with hypoglycemia of 43. He was given IV dextrose and discharged in stable condition. He was also seen in the clinic and his insulin regimen was changed. However, he continues to have similar episodes with reports of today experiencing another episode. I have Reduced his dose of Lantus to 36 units in the morning and 30 units at bedtime. The patient understands the symptoms of hypoglycemia. Have encouraged him to measure his blood sugar regularly and bring his meter to the clinic. Of counseled him about timing of his meals and insulin injection. I have also encouraged him to call the ED if he experiences episodes of hypoglycemia.

## 2012-01-03 NOTE — Assessment & Plan Note (Signed)
Examination of this wound is concerning for infection. It also sounds like [name] was not able to get his antibiotics, which had been prescribed upon discharge. Of started him on doxycycline for 10 days. Have also ordered a CBC. I will encourage him to call Dr. Diamantina Providence office for evaluation of this recent amputation, which appears to be septic. The other possibility to account for the swelling of the same leg to the level the mid shin is a DVT. I will do a lower extremity Doppler of the left rule out a DVT. This will be done as outpatient tomorrow.

## 2012-01-03 NOTE — Patient Instructions (Addendum)
Please return tomorrow for Doppler of your extremities  Please start taking Amlodipine 5mg  Please start taking doxycycline 100mg  BID for 10days  Please reduce insulin to 36 in morning and 30 units at night  Please you need to contact Dr [name] Office for an appointment to evaluate your foot.

## 2012-01-03 NOTE — Progress Notes (Signed)
Patient ID: [name], male   DOB: Aug 23, 1965, 46 y.o.   MRN: [id number]  Subjective:   Patient ID: [name] male   DOB: [birth year] 46 y.o.   MRN: [id number]  HPI: [name] is a 46 y.o. with past medical history significant for uncontrolled diabetes, recent toe amputations, and recurrent episodes of hypoglycemia. He is currently a resident at rehabilitation facility. He presents today with increased swelling of the left lower extremity. He also thinks that his ulcers are not healing as expected. He is closely following up with wound care. He has an upcoming appointment with orthopedic surgeon, Dr. August Saucer who performed amputation recently in August 2013. He reports that today he had an episode of hypoglycemia with blood sugars in the ranges of 35 mg/dL. The patient understands the risks of hypoglycemia and is very familiar with the symptoms. His recent visits on 12/20/2011, his insulin was dosed at 40 international units Lantus at morning and 36 units at bedtime. He denies any history of fevers or chills. He denies similar swelling, and the right foot. He does not have any history of blood clots. [name] has difficulties accessing his medications since he has been in this rehabilitation facility. I have tried to contact the facility to assist hemoptysis. His medication without success.   Past Medical History  Diagnosis Date  . Onychomycosis   . Hypertension   . Diabetic peripheral neuropathy   . Diabetic foot ulcer 12/09    left plantar area  . History of drug abuse     Cocaine and marijuana  . Exposure to trichomonas     treated empirically  . Cellulitis of left foot 03/2008    left 4th and 5th metatarsal area  . Hyperlipidemia   . 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 Ulcerative esophagitis 2007    severe, complicated with UGI bleed  . Mallory Janann August tear April 2009  . History of prolonged Q-T interval on ECG   . History of chronic pyelonephritis     secondary to left  pyeloureteral junction obstruction.  . 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 Chronic renal insufficiency   . Depression   . Peripheral vascular disease   . Type II diabetes mellitus   . GERD (gastroesophageal reflux disease)    Current Outpatient Prescriptions  Medication Sig Dispense Refill  . carvedilol (COREG) 6.25 MG tablet Take 1 tablet (6.25 mg total) by mouth 2 (two) times daily with a meal.  60 tablet  3  . FLUoxetine (PROZAC) 10 MG capsule Take 1 capsule (10 mg total) by mouth daily.  30 capsule  3  . gabapentin (NEURONTIN) 300 MG capsule Take 2 capsules (600 mg total) by mouth 2 (two) times daily.  60 capsule  3  . insulin lispro protamine-insulin lispro (HUMALOG 75/25) (75-25) 100 UNIT/ML SUSP Take 36 units with breakfast and 30 Units at supper time.  Take 15 minutes before eating. Please check the blood sugar before giving.  If blood sugar <150 please subtract 5 units.  10 mL  0  . nicotine (NICODERM CQ - DOSED IN MG/24 HOURS) 21 mg/24hr patch Place 1 patch onto the skin daily.  28 patch  0  . omeprazole (PRILOSEC) 40 MG capsule Take 1 capsule (40 mg total) by mouth daily. You can find this medication in generic form available over-the-counter.  30 capsule  3  . quinapril-hydrochlorothiazide (ACCURETIC) 20-12.5 MG per tablet Take 2 tablets by mouth daily.  60 tablet  6  . DISCONTD: insulin lispro protamine-insulin lispro (HUMALOG 75/25) (75-25) 100 UNIT/ML SUSP Take 40 units with breakfast and 34 Units at supper time.  Take 15 minutes before eating. Please check the blood sugar before giving.  If blood sugar <150 please subtract 5 units.      Marland Kitchen amLODipine (NORVASC) 5 MG tablet Take 1 tablet (5 mg total) by mouth daily.  30 tablet  1  . doxycycline (VIBRA-TABS) 100 MG tablet Take 1 tablet (100 mg total) by mouth 2 (two) times daily.  20 tablet  0   No family history on file. History   Social History  . Marital Status: Married    Spouse Name: N/A     Number of Children: N/A  . Years of Education: N/A   Social History Main Topics  . Smoking status: Current Some Day Smoker -- 0.5 packs/day for 34 years    Types: Cigarettes  . Smokeless tobacco: Never Used   Comment: Has stopped 2 weeks ago- QUIT line info given to pt and wife for asst with smoking patchers, etc  . Alcohol Use: Yes     12/05/2011 "occasionally; maybe New Years"  . Drug Use: Yes    Special: Marijuana     12/05/2011 "last time ~ 2 wk ago"  . Sexually Active: Yes   Other Topics Concern  . None   Social History Narrative  . None   Review of Systems: Review of other systems is unremarkable except as indicated in the history of present illness. Objective:  Physical Exam: Filed Vitals:   01/03/12 1403  BP: 153/90  Pulse: 92  Temp: 98 F (36.7 C)  TempSrc: Oral  Height: 5\' 8"  (1.727 m)  Weight: 227 lb 8 oz (103.193 kg)  SpO2: 99%   Physical Exam  Constitutional: He is oriented to person, place, and time. He appears well-developed and well-nourished. No distress.  HENT:  Head: Atraumatic.  Nose: Nose normal.  Eyes: EOM are normal. Pupils are equal, round, and reactive to light.  Neck: No JVD present.  Cardiovascular: Normal rate, regular rhythm, normal heart sounds and intact distal pulses.  Exam reveals no gallop and no friction rub.   No murmur heard. Pulmonary/Chest: Effort normal and breath sounds normal. No respiratory distress. He exhibits no tenderness.  Abdominal: Soft. Bowel sounds are normal.  Musculoskeletal: He exhibits edema and tenderness.  Neurological: He is alert and oriented to person, place, and time. No cranial nerve deficit.  Skin: No rash noted. He is not diaphoretic. There is erythema.  Psychiatric: He has a normal mood and affect.   left foot: Amputation Wounds of the left fourth and third toes appear early to septic, but without active drainage of pus. There is a small foul smell. However, the patient thinks that these wounds have  improved. There is unilateral swelling of the left leg up to the level of the mid shin. The leg. He does not feel warmer compared to the right one. However, there is a region of tenderness around the medial aspect of the lower shin. Sensation in his foot is reduced.  Right foot: Patient has another ulcer on the right foot. However, this one is smaller compared to the one on the left. The ulcer involves the right great toe status post amputation. Assessment & Plan:  Assessment and plan has been discussed with with Dr. Kem Kays as detailed under each problem.

## 2012-01-04 ENCOUNTER — Telehealth: Payer: Self-pay | Admitting: Licensed Clinical Social Worker

## 2012-01-04 ENCOUNTER — Encounter (HOSPITAL_COMMUNITY): Payer: Self-pay

## 2012-01-04 LAB — MICROALBUMIN / CREATININE URINE RATIO
Creatinine, Urine: 43.2 mg/dL
Microalb, Ur: 1.92 mg/dL — ABNORMAL HIGH (ref 0.00–1.89)

## 2012-01-04 NOTE — Telephone Encounter (Signed)
CSW received call from pt's spouse.  Mrs. Kiedrowski states pt is currently in rehab facility and has appt at [facility] today.  Pt plans to be d/c from facility on 01/05/12.  Mr. Dwinell is awaiting 31 day bus pass from in-house SW dept and requesting bus fare until appproval from in-house dept.  CSW provided Mr. Biasi with 2 bus passes.  Left message informing pt, if approved for 31 day bus pass, the pass will be available on 01/05/12.  CSW left pass at front desk for pt.

## 2012-01-05 ENCOUNTER — Ambulatory Visit (HOSPITAL_COMMUNITY): Admission: RE | Admit: 2012-01-05 | Payer: MEDICAID | Source: Ambulatory Visit

## 2012-01-05 ENCOUNTER — Ambulatory Visit (HOSPITAL_COMMUNITY): Payer: Self-pay

## 2012-01-05 ENCOUNTER — Telehealth: Payer: Self-pay | Admitting: *Deleted

## 2012-01-05 ENCOUNTER — Other Ambulatory Visit: Payer: Self-pay | Admitting: Internal Medicine

## 2012-01-05 DIAGNOSIS — R609 Edema, unspecified: Secondary | ICD-10-CM

## 2012-01-05 NOTE — Telephone Encounter (Signed)
Cindy from [facility] calls and states the order for pt is not a correct order in reviewing the chart pt should have had a venous doppler to r/o dvt, could you please cancel the first order and put the correct order in pt will be here at 1000

## 2012-01-08 NOTE — Progress Notes (Signed)
Richard, has this patient gotten the U/S to r/o DVT done yet? If not, please call and stress this needs to be done.

## 2012-01-08 NOTE — Progress Notes (Signed)
INTERNAL MEDICINE TEACHING ATTENDING ADDENDUM - Jonah Blue, DO: I personally saw and evaluated [name] in this clinic visit in conjunction with the resident, Dr, Zada Girt. I have discussed patient's plan of care with medical resident during this visit. I have confirmed the physical exam findings and have read and agree with the clinic note including the plan.

## 2012-01-16 ENCOUNTER — Telehealth: Payer: Self-pay | Admitting: *Deleted

## 2012-01-16 NOTE — Telephone Encounter (Signed)
Attempts to call pt at numbers in chart.   Messages were left at both numbers for pt to call the Clinics as soon as possible. Call to [facility] pt has left the facility.  Angelina Ok, RN 01/16/2012 3:17 PM.

## 2012-01-17 ENCOUNTER — Telehealth: Payer: Self-pay | Admitting: Internal Medicine

## 2012-01-17 NOTE — Telephone Encounter (Signed)
I called [name] about the venous doppler ultrasound for the left swollen leg. I talked to his wife who told me that the leg swelling has reduced. [name] and he could not make it to [facility] for the test after he left [facility] facility. His currently working on his medicare paperwork and hopefully he will manage to get transportation to the hospital for appointments once that works out.

## 2012-02-18 ENCOUNTER — Encounter (HOSPITAL_COMMUNITY): Payer: Self-pay | Admitting: Anesthesiology

## 2012-02-18 ENCOUNTER — Inpatient Hospital Stay (HOSPITAL_COMMUNITY): Payer: Medicaid Other | Admitting: Anesthesiology

## 2012-02-18 ENCOUNTER — Inpatient Hospital Stay (HOSPITAL_COMMUNITY)
Admission: EM | Admit: 2012-02-18 | Discharge: 2012-02-20 | DRG: 617 | Disposition: A | Discharge status: Home / Self Care | Payer: Medicaid Other | Attending: Internal Medicine | Admitting: Internal Medicine

## 2012-02-18 ENCOUNTER — Encounter (HOSPITAL_COMMUNITY): Payer: Self-pay | Admitting: *Deleted

## 2012-02-18 ENCOUNTER — Inpatient Hospital Stay (HOSPITAL_COMMUNITY): Payer: Medicaid Other

## 2012-02-18 ENCOUNTER — Encounter (HOSPITAL_COMMUNITY)
Admission: EM | Disposition: A | Discharge status: Home / Self Care | Payer: Self-pay | Source: Home / Self Care | Attending: Internal Medicine

## 2012-02-18 ENCOUNTER — Encounter (HOSPITAL_COMMUNITY): Payer: Self-pay | Admitting: Emergency Medicine

## 2012-02-18 ENCOUNTER — Emergency Department (HOSPITAL_COMMUNITY): Payer: Medicaid Other

## 2012-02-18 DIAGNOSIS — E785 Hyperlipidemia, unspecified: Secondary | ICD-10-CM | POA: Diagnosis present

## 2012-02-18 DIAGNOSIS — L97509 Non-pressure chronic ulcer of other part of unspecified foot with unspecified severity: Secondary | ICD-10-CM | POA: Diagnosis present

## 2012-02-18 DIAGNOSIS — L0390 Cellulitis, unspecified: Secondary | ICD-10-CM

## 2012-02-18 DIAGNOSIS — E1149 Type 2 diabetes mellitus with other diabetic neurological complication: Secondary | ICD-10-CM

## 2012-02-18 DIAGNOSIS — E860 Dehydration: Secondary | ICD-10-CM

## 2012-02-18 DIAGNOSIS — N182 Chronic kidney disease, stage 2 (mild): Secondary | ICD-10-CM | POA: Diagnosis present

## 2012-02-18 DIAGNOSIS — E1049 Type 1 diabetes mellitus with other diabetic neurological complication: Secondary | ICD-10-CM | POA: Diagnosis present

## 2012-02-18 DIAGNOSIS — F32A Depression, unspecified: Secondary | ICD-10-CM | POA: Diagnosis present

## 2012-02-18 DIAGNOSIS — N189 Chronic kidney disease, unspecified: Secondary | ICD-10-CM

## 2012-02-18 DIAGNOSIS — B351 Tinea unguium: Secondary | ICD-10-CM | POA: Diagnosis present

## 2012-02-18 DIAGNOSIS — E1065 Type 1 diabetes mellitus with hyperglycemia: Principal | ICD-10-CM | POA: Diagnosis present

## 2012-02-18 DIAGNOSIS — L02619 Cutaneous abscess of unspecified foot: Secondary | ICD-10-CM

## 2012-02-18 DIAGNOSIS — E11621 Type 2 diabetes mellitus with foot ulcer: Secondary | ICD-10-CM | POA: Diagnosis present

## 2012-02-18 DIAGNOSIS — K5900 Constipation, unspecified: Secondary | ICD-10-CM | POA: Diagnosis present

## 2012-02-18 DIAGNOSIS — I129 Hypertensive chronic kidney disease with stage 1 through stage 4 chronic kidney disease, or unspecified chronic kidney disease: Secondary | ICD-10-CM

## 2012-02-18 DIAGNOSIS — I739 Peripheral vascular disease, unspecified: Secondary | ICD-10-CM | POA: Diagnosis present

## 2012-02-18 DIAGNOSIS — Z598 Other problems related to housing and economic circumstances: Secondary | ICD-10-CM

## 2012-02-18 DIAGNOSIS — I10 Essential (primary) hypertension: Secondary | ICD-10-CM

## 2012-02-18 DIAGNOSIS — Z5900 Homelessness unspecified: Secondary | ICD-10-CM

## 2012-02-18 DIAGNOSIS — M869 Osteomyelitis, unspecified: Secondary | ICD-10-CM | POA: Diagnosis present

## 2012-02-18 DIAGNOSIS — E1142 Type 2 diabetes mellitus with diabetic polyneuropathy: Secondary | ICD-10-CM | POA: Diagnosis present

## 2012-02-18 DIAGNOSIS — F1910 Other psychoactive substance abuse, uncomplicated: Secondary | ICD-10-CM | POA: Diagnosis present

## 2012-02-18 DIAGNOSIS — Z9119 Patient's noncompliance with other medical treatment and regimen: Secondary | ICD-10-CM

## 2012-02-18 DIAGNOSIS — IMO0002 Reserved for concepts with insufficient information to code with codable children: Principal | ICD-10-CM | POA: Diagnosis present

## 2012-02-18 DIAGNOSIS — R109 Unspecified abdominal pain: Secondary | ICD-10-CM

## 2012-02-18 DIAGNOSIS — L03039 Cellulitis of unspecified toe: Secondary | ICD-10-CM | POA: Diagnosis present

## 2012-02-18 DIAGNOSIS — E1010 Type 1 diabetes mellitus with ketoacidosis without coma: Secondary | ICD-10-CM

## 2012-02-18 DIAGNOSIS — F17200 Nicotine dependence, unspecified, uncomplicated: Secondary | ICD-10-CM | POA: Diagnosis present

## 2012-02-18 DIAGNOSIS — K219 Gastro-esophageal reflux disease without esophagitis: Secondary | ICD-10-CM | POA: Diagnosis present

## 2012-02-18 DIAGNOSIS — Z794 Long term (current) use of insulin: Secondary | ICD-10-CM

## 2012-02-18 DIAGNOSIS — E1351 Other specified diabetes mellitus with diabetic peripheral angiopathy without gangrene: Secondary | ICD-10-CM

## 2012-02-18 DIAGNOSIS — L089 Local infection of the skin and subcutaneous tissue, unspecified: Secondary | ICD-10-CM | POA: Diagnosis present

## 2012-02-18 DIAGNOSIS — M9080 Osteopathy in diseases classified elsewhere, unspecified site: Secondary | ICD-10-CM | POA: Diagnosis present

## 2012-02-18 DIAGNOSIS — M86179 Other acute osteomyelitis, unspecified ankle and foot: Secondary | ICD-10-CM | POA: Diagnosis present

## 2012-02-18 DIAGNOSIS — F329 Major depressive disorder, single episode, unspecified: Secondary | ICD-10-CM | POA: Diagnosis present

## 2012-02-18 DIAGNOSIS — F3289 Other specified depressive episodes: Secondary | ICD-10-CM | POA: Diagnosis present

## 2012-02-18 DIAGNOSIS — E1110 Type 2 diabetes mellitus with ketoacidosis without coma: Secondary | ICD-10-CM

## 2012-02-18 DIAGNOSIS — E1165 Type 2 diabetes mellitus with hyperglycemia: Secondary | ICD-10-CM | POA: Diagnosis present

## 2012-02-18 DIAGNOSIS — Z5987 Material hardship due to limited financial resources, not elsewhere classified: Secondary | ICD-10-CM

## 2012-02-18 DIAGNOSIS — R1115 Cyclical vomiting syndrome unrelated to migraine: Secondary | ICD-10-CM

## 2012-02-18 HISTORY — PX: AMPUTATION: SHX166

## 2012-02-18 HISTORY — PX: I & D EXTREMITY: SHX5045

## 2012-02-18 LAB — CBC
HCT: 37.3 % — ABNORMAL LOW (ref 39.0–52.0)
Hemoglobin: 12.5 g/dL — ABNORMAL LOW (ref 13.0–17.0)
MCH: 29.6 pg (ref 26.0–34.0)
MCH: 29.4 pg (ref 26.0–34.0)
MCHC: 33.5 g/dL (ref 30.0–36.0)
MCHC: 33.4 g/dL (ref 30.0–36.0)
MCV: 88 fL (ref 78.0–100.0)
Platelets: 319 10*3/uL (ref 150–400)
RDW: 13.4 % (ref 11.5–15.5)
WBC: 15 10*3/uL — ABNORMAL HIGH (ref 4.0–10.5)

## 2012-02-18 LAB — GLUCOSE, CAPILLARY
Glucose-Capillary: 359 mg/dL — ABNORMAL HIGH (ref 70–99)
Glucose-Capillary: 329 mg/dL — ABNORMAL HIGH (ref 70–99)
Glucose-Capillary: 193 mg/dL — ABNORMAL HIGH (ref 70–99)
Glucose-Capillary: 184 mg/dL — ABNORMAL HIGH (ref 70–99)
Glucose-Capillary: 158 mg/dL — ABNORMAL HIGH (ref 70–99)
Glucose-Capillary: 139 mg/dL — ABNORMAL HIGH (ref 70–99)
Glucose-Capillary: 164 mg/dL — ABNORMAL HIGH (ref 70–99)

## 2012-02-18 LAB — BASIC METABOLIC PANEL
BUN: 15 mg/dL (ref 6–23)
CO2: 25 mEq/L (ref 19–32)
Calcium: 8.6 mg/dL (ref 8.4–10.5)
Chloride: 100 mEq/L (ref 96–112)
Chloride: 101 mEq/L (ref 96–112)
Creatinine, Ser: 1.32 mg/dL (ref 0.50–1.35)
GFR calc Af Amer: 74 mL/min — ABNORMAL LOW (ref >90)
GFR calc Af Amer: 73 mL/min — ABNORMAL LOW (ref >90)
GFR calc non Af Amer: 64 mL/min — ABNORMAL LOW (ref >90)
Potassium: 3.7 mEq/L (ref 3.5–5.1)
Potassium: 3.7 mEq/L (ref 3.5–5.1)
Potassium: 3.8 mEq/L (ref 3.5–5.1)
Sodium: 139 mEq/L (ref 135–145)
Sodium: 138 mEq/L (ref 135–145)
Sodium: 138 mEq/L (ref 135–145)

## 2012-02-18 LAB — URINALYSIS, ROUTINE W REFLEX MICROSCOPIC
Bilirubin Urine: NEGATIVE
Nitrite: NEGATIVE
Specific Gravity, Urine: 1.035 — ABNORMAL HIGH (ref 1.005–1.030)
Urobilinogen, UA: 1 mg/dL (ref 0.0–1.0)

## 2012-02-18 LAB — URINE MICROSCOPIC-ADD ON

## 2012-02-18 LAB — AMYLASE: Amylase: 124 U/L — ABNORMAL HIGH (ref 0–105)

## 2012-02-18 LAB — POCT I-STAT 3, ART BLOOD GAS (G3+)
Bicarbonate: 26.3 mEq/L — ABNORMAL HIGH (ref 20.0–24.0)
pCO2 arterial: 39.5 mmHg (ref 35.0–45.0)
pO2, Arterial: 96 mmHg (ref 80.0–100.0)

## 2012-02-18 LAB — COMPREHENSIVE METABOLIC PANEL
BUN: 19 mg/dL (ref 6–23)
Calcium: 9.8 mg/dL (ref 8.4–10.5)
Creatinine, Ser: 1.27 mg/dL (ref 0.50–1.35)
GFR calc Af Amer: 77 mL/min — ABNORMAL LOW (ref >90)
Glucose, Bld: 356 mg/dL — ABNORMAL HIGH (ref 70–99)
Total Protein: 7.9 g/dL (ref 6.0–8.3)

## 2012-02-18 LAB — RAPID URINE DRUG SCREEN, HOSP PERFORMED
Barbiturates: NOT DETECTED
Cocaine: NOT DETECTED
Opiates: POSITIVE — AB

## 2012-02-18 LAB — TROPONIN I: Troponin I: <0.3 ng/mL (ref <0.30)

## 2012-02-18 LAB — LIPASE, BLOOD: Lipase: 78 U/L — ABNORMAL HIGH (ref 11–59)

## 2012-02-18 SURGERY — IRRIGATION AND DEBRIDEMENT EXTREMITY
Anesthesia: General | Site: Foot | Laterality: Left | Wound class: Dirty or Infected

## 2012-02-18 MED ORDER — DEXTROSE-NACL 5-0.45 % IV SOLN
INTRAVENOUS | Status: DC
  Administered 2012-02-18: 1000 mL via INTRAVENOUS

## 2012-02-18 MED ORDER — HYDROMORPHONE HCL PF 1 MG/ML IJ SOLN: 1.0000 mg | INTRAMUSCULAR | Status: DC | PRN

## 2012-02-18 MED ORDER — ENOXAPARIN SODIUM 40 MG/0.4ML ~~LOC~~ SOLN
40.0000 mg | SUBCUTANEOUS | Status: DC
  Filled 2012-02-18 (×2): qty 0.4

## 2012-02-18 MED ORDER — DEXTROSE 50 % IV SOLN: 25.0000 mL | INTRAVENOUS | Status: DC | PRN

## 2012-02-18 MED ORDER — SODIUM CHLORIDE 0.9 % IV SOLN
INTRAVENOUS | Status: DC | PRN
  Administered 2012-02-18: 19:00:00 via INTRAVENOUS

## 2012-02-18 MED ORDER — ONDANSETRON HCL 4 MG/2ML IJ SOLN: 4.0000 mg | Freq: Once | INTRAMUSCULAR | Status: DC | PRN

## 2012-02-18 MED ORDER — PIPERACILLIN-TAZOBACTAM 3.375 G IVPB
3.3750 g | Freq: Once | INTRAVENOUS | Status: AC
  Administered 2012-02-18: 3.375 g via INTRAVENOUS
  Filled 2012-02-18: qty 50

## 2012-02-18 MED ORDER — SODIUM CHLORIDE 0.45 % IV SOLN: INTRAVENOUS | Status: DC

## 2012-02-18 MED ORDER — PROPOFOL 10 MG/ML IV BOLUS
INTRAVENOUS | Status: DC | PRN
  Administered 2012-02-18: 200 mg via INTRAVENOUS

## 2012-02-18 MED ORDER — HYDROMORPHONE HCL PF 1 MG/ML IJ SOLN
1.0000 mg | Freq: Once | INTRAMUSCULAR | Status: AC
  Administered 2012-02-18: 1 mg via INTRAVENOUS
  Filled 2012-02-18: qty 1

## 2012-02-18 MED ORDER — HYDROMORPHONE HCL PF 1 MG/ML IJ SOLN
INTRAMUSCULAR | Status: AC
  Filled 2012-02-18: qty 1

## 2012-02-18 MED ORDER — PANTOPRAZOLE SODIUM 40 MG IV SOLR
40.0000 mg | Freq: Once | INTRAVENOUS | Status: AC
  Administered 2012-02-18: 40 mg via INTRAVENOUS
  Filled 2012-02-18: qty 40

## 2012-02-18 MED ORDER — ONDANSETRON HCL 4 MG/2ML IJ SOLN
4.0000 mg | Freq: Once | INTRAMUSCULAR | Status: AC
  Administered 2012-02-18: 4 mg via INTRAVENOUS
  Filled 2012-02-18: qty 2

## 2012-02-18 MED ORDER — SUFENTANIL CITRATE 50 MCG/ML IV SOLN
INTRAVENOUS | Status: DC | PRN
  Administered 2012-02-18: 10 ug via INTRAVENOUS

## 2012-02-18 MED ORDER — SODIUM CHLORIDE 0.9 % IV SOLN
1000.0000 mL | Freq: Once | INTRAVENOUS | Status: AC
  Administered 2012-02-18: 1000 mL via INTRAVENOUS

## 2012-02-18 MED ORDER — SODIUM CHLORIDE 0.9 % IR SOLN
Status: DC | PRN
  Administered 2012-02-18: 1000 mL

## 2012-02-18 MED ORDER — VANCOMYCIN HCL 10 G IV SOLR: 1.0000 g | Freq: Once | INTRAVENOUS | Status: DC

## 2012-02-18 MED ORDER — GADOBENATE DIMEGLUMINE 529 MG/ML IV SOLN
20.0000 mL | Freq: Once | INTRAVENOUS | Status: AC | PRN
  Administered 2012-02-18: 20 mL via INTRAVENOUS

## 2012-02-18 MED ORDER — MORPHINE SULFATE 2 MG/ML IJ SOLN: 2.0000 mg | INTRAMUSCULAR | Status: DC | PRN

## 2012-02-18 MED ORDER — VANCOMYCIN HCL IN DEXTROSE 1-5 GM/200ML-% IV SOLN
1000.0000 mg | Freq: Two times a day (BID) | INTRAVENOUS | Status: DC
  Administered 2012-02-19 – 2012-02-20 (×4): 1000 mg via INTRAVENOUS
  Filled 2012-02-18 (×7): qty 200

## 2012-02-18 MED ORDER — ONDANSETRON HCL 4 MG/2ML IJ SOLN
INTRAMUSCULAR | Status: DC | PRN
  Administered 2012-02-18: 4 mg via INTRAVENOUS

## 2012-02-18 MED ORDER — SODIUM CHLORIDE 0.9 % IV SOLN
INTRAVENOUS | Status: DC
  Administered 2012-02-18: 2.8 [IU]/h via INTRAVENOUS
  Filled 2012-02-18: qty 1

## 2012-02-18 MED ORDER — SODIUM CHLORIDE 0.9 % IV SOLN: INTRAVENOUS | Status: AC

## 2012-02-18 MED ORDER — HYDROMORPHONE HCL PF 1 MG/ML IJ SOLN
0.2500 mg | INTRAMUSCULAR | Status: DC | PRN
  Administered 2012-02-18 (×3): 0.5 mg via INTRAVENOUS

## 2012-02-18 MED ORDER — MORPHINE SULFATE 4 MG/ML IJ SOLN
4.0000 mg | Freq: Once | INTRAMUSCULAR | Status: AC
  Administered 2012-02-18: 4 mg via INTRAVENOUS
  Filled 2012-02-18: qty 1

## 2012-02-18 MED ORDER — LIDOCAINE HCL (CARDIAC) 20 MG/ML IV SOLN
INTRAVENOUS | Status: DC | PRN
  Administered 2012-02-18: 100 mg via INTRAVENOUS

## 2012-02-18 MED ORDER — METOCLOPRAMIDE HCL 5 MG/ML IJ SOLN
10.0000 mg | Freq: Once | INTRAMUSCULAR | Status: AC
  Administered 2012-02-18: 10 mg via INTRAVENOUS
  Filled 2012-02-18: qty 2

## 2012-02-18 MED ORDER — MIDAZOLAM HCL 5 MG/5ML IJ SOLN
INTRAMUSCULAR | Status: DC | PRN
  Administered 2012-02-18: 2 mg via INTRAVENOUS

## 2012-02-18 MED ORDER — PHENYLEPHRINE HCL 10 MG/ML IJ SOLN
INTRAMUSCULAR | Status: DC | PRN
  Administered 2012-02-18 (×2): 80 ug via INTRAVENOUS

## 2012-02-18 MED ORDER — GLYCOPYRROLATE 0.2 MG/ML IJ SOLN
INTRAMUSCULAR | Status: DC | PRN
  Administered 2012-02-18: 0.2 mg via INTRAVENOUS

## 2012-02-18 MED ORDER — POTASSIUM CHLORIDE 10 MEQ/100ML IV SOLN
10.0000 meq | INTRAVENOUS | Status: AC
  Administered 2012-02-18 (×2): 10 meq via INTRAVENOUS
  Filled 2012-02-18 (×3): qty 100

## 2012-02-18 MED ORDER — DEXTROSE-NACL 5-0.45 % IV SOLN
INTRAVENOUS | Status: DC
  Administered 2012-02-18: via INTRAVENOUS

## 2012-02-18 MED ORDER — VANCOMYCIN HCL IN DEXTROSE 1-5 GM/200ML-% IV SOLN
1000.0000 mg | Freq: Once | INTRAVENOUS | Status: AC
  Administered 2012-02-18: 1000 mg via INTRAVENOUS
  Filled 2012-02-18: qty 200

## 2012-02-18 MED ORDER — OXYCODONE HCL 5 MG PO TABS
10.0000 mg | ORAL_TABLET | ORAL | Status: DC | PRN
  Administered 2012-02-19 – 2012-02-20 (×5): 10 mg via ORAL
  Filled 2012-02-18 (×5): qty 2

## 2012-02-18 MED ORDER — METHOCARBAMOL 500 MG PO TABS: 500.0000 mg | ORAL_TABLET | Freq: Four times a day (QID) | ORAL | Status: DC | PRN

## 2012-02-18 MED ORDER — SODIUM CHLORIDE 0.9 % IV SOLN: INTRAVENOUS | Status: DC

## 2012-02-18 MED ORDER — PIPERACILLIN-TAZOBACTAM 3.375 G IVPB
3.3750 g | Freq: Three times a day (TID) | INTRAVENOUS | Status: DC
  Administered 2012-02-19 – 2012-02-20 (×5): 3.375 g via INTRAVENOUS
  Filled 2012-02-18 (×10): qty 50

## 2012-02-18 MED ORDER — SODIUM CHLORIDE 0.9 % IV SOLN
1000.0000 mL | INTRAVENOUS | Status: DC
  Administered 2012-02-18: 1000 mL via INTRAVENOUS

## 2012-02-18 SURGICAL SUPPLY — 71 items
BANDAGE CONFORM 3  STR LF (GAUZE/BANDAGES/DRESSINGS) IMPLANT
BANDAGE ELASTIC 3 VELCRO ST LF (GAUZE/BANDAGES/DRESSINGS) IMPLANT
BANDAGE GAUZE 4  KLING STR (GAUZE/BANDAGES/DRESSINGS) IMPLANT
BANDAGE GAUZE ELAST BULKY 4 IN (GAUZE/BANDAGES/DRESSINGS) ×1 IMPLANT
BLADE AVERAGE 25X9 (BLADE) IMPLANT
BLADE MINI RND TIP GREEN BEAV (BLADE) IMPLANT
BLADE SURG 10 STRL SS (BLADE) ×3 IMPLANT
BNDG CMPR 9X4 STRL LF SNTH (GAUZE/BANDAGES/DRESSINGS) ×1
BNDG COHESIVE 1X5 TAN STRL LF (GAUZE/BANDAGES/DRESSINGS) IMPLANT
BNDG COHESIVE 4X5 TAN STRL (GAUZE/BANDAGES/DRESSINGS) ×2 IMPLANT
BNDG COHESIVE 6X5 TAN STRL LF (GAUZE/BANDAGES/DRESSINGS) ×2 IMPLANT
BNDG ESMARK 4X9 LF (GAUZE/BANDAGES/DRESSINGS) ×2 IMPLANT
BNDG GAUZE STRTCH 6 (GAUZE/BANDAGES/DRESSINGS) ×6 IMPLANT
CLOTH BEACON ORANGE TIMEOUT ST (SAFETY) ×2 IMPLANT
CORDS BIPOLAR (ELECTRODE) ×1 IMPLANT
COVER SURGICAL LIGHT HANDLE (MISCELLANEOUS) ×2 IMPLANT
CUFF TOURNIQUET SINGLE 18IN (TOURNIQUET CUFF) ×2 IMPLANT
CUFF TOURNIQUET SINGLE 24IN (TOURNIQUET CUFF) IMPLANT
CUFF TOURNIQUET SINGLE 34IN LL (TOURNIQUET CUFF) IMPLANT
CUFF TOURNIQUET SINGLE 44IN (TOURNIQUET CUFF) IMPLANT
DRAPE ORTHO SPLIT 77X108 STRL (DRAPES) ×4
DRAPE SURG 17X23 STRL (DRAPES) IMPLANT
DRAPE SURG ORHT 6 SPLT 77X108 (DRAPES) ×2 IMPLANT
DRAPE U-SHAPE 47X51 STRL (DRAPES) ×2 IMPLANT
DURAPREP 26ML APPLICATOR (WOUND CARE) ×2 IMPLANT
ELECT CAUTERY BLADE 6.4 (BLADE) IMPLANT
ELECT REM PT RETURN 9FT ADLT (ELECTROSURGICAL) ×2
ELECTRODE REM PT RTRN 9FT ADLT (ELECTROSURGICAL) ×1 IMPLANT
GAUZE SPONGE 2X2 8PLY STRL LF (GAUZE/BANDAGES/DRESSINGS) IMPLANT
GAUZE XEROFORM 1X8 LF (GAUZE/BANDAGES/DRESSINGS) ×2 IMPLANT
GAUZE XEROFORM 5X9 LF (GAUZE/BANDAGES/DRESSINGS) ×1 IMPLANT
GLOVE BIOGEL PI IND STRL 8 (GLOVE) ×2 IMPLANT
GLOVE BIOGEL PI INDICATOR 8 (GLOVE) ×2
GLOVE ORTHO TXT STRL SZ7.5 (GLOVE) ×2 IMPLANT
GOWN PREVENTION PLUS LG XLONG (DISPOSABLE) IMPLANT
GOWN PREVENTION PLUS XLARGE (GOWN DISPOSABLE) ×2 IMPLANT
GOWN STRL NON-REIN LRG LVL3 (GOWN DISPOSABLE) ×2 IMPLANT
HANDPIECE INTERPULSE COAX TIP (DISPOSABLE)
KIT BASIN OR (CUSTOM PROCEDURE TRAY) ×2 IMPLANT
KIT ROOM TURNOVER OR (KITS) ×2 IMPLANT
MANIFOLD NEPTUNE II (INSTRUMENTS) ×2 IMPLANT
NDL HYPO 25GX1X1/2 BEV (NEEDLE) IMPLANT
NEEDLE HYPO 25GX1X1/2 BEV (NEEDLE) IMPLANT
NS IRRIG 1000ML POUR BTL (IV SOLUTION) ×2 IMPLANT
PACK ORTHO EXTREMITY (CUSTOM PROCEDURE TRAY) ×2 IMPLANT
PAD ARMBOARD 7.5X6 YLW CONV (MISCELLANEOUS) ×4 IMPLANT
PAD CAST 4YDX4 CTTN HI CHSV (CAST SUPPLIES) IMPLANT
PADDING CAST ABS 4INX4YD NS (CAST SUPPLIES)
PADDING CAST ABS COTTON 4X4 ST (CAST SUPPLIES) ×2 IMPLANT
PADDING CAST COTTON 4X4 STRL (CAST SUPPLIES)
PADDING CAST COTTON 6X4 STRL (CAST SUPPLIES) ×1 IMPLANT
SET HNDPC FAN SPRY TIP SCT (DISPOSABLE) IMPLANT
SPECIMEN JAR SMALL (MISCELLANEOUS) ×2 IMPLANT
SPONGE GAUZE 2X2 STER 10/PKG (GAUZE/BANDAGES/DRESSINGS)
SPONGE GAUZE 4X4 12PLY (GAUZE/BANDAGES/DRESSINGS) ×2 IMPLANT
SPONGE LAP 18X18 X RAY DECT (DISPOSABLE) ×2 IMPLANT
STOCKINETTE IMPERVIOUS 9X36 MD (GAUZE/BANDAGES/DRESSINGS) ×2 IMPLANT
SUCTION FRAZIER TIP 10 FR DISP (SUCTIONS) IMPLANT
SUT ETHILON 2 0 FS 18 (SUTURE) ×3 IMPLANT
SUT ETHILON 2 0 PSLX (SUTURE) ×2 IMPLANT
SUT ETHILON 3 0 PS 1 (SUTURE) ×2 IMPLANT
SUT VIC AB 2-0 FS1 27 (SUTURE) IMPLANT
SYR CONTROL 10ML LL (SYRINGE) IMPLANT
TOWEL OR 17X24 6PK STRL BLUE (TOWEL DISPOSABLE) ×2 IMPLANT
TOWEL OR 17X26 10 PK STRL BLUE (TOWEL DISPOSABLE) ×2 IMPLANT
TUBE ANAEROBIC SPECIMEN COL (MISCELLANEOUS) IMPLANT
TUBE CONNECTING 12X1/4 (SUCTIONS) ×2 IMPLANT
TUBE FEEDING 5FR 15 INCH (TUBING) IMPLANT
UNDERPAD 30X30 INCONTINENT (UNDERPADS AND DIAPERS) ×1 IMPLANT
WATER STERILE IRR 1000ML POUR (IV SOLUTION) ×1 IMPLANT
YANKAUER SUCT BULB TIP NO VENT (SUCTIONS) ×2 IMPLANT

## 2012-02-18 NOTE — H&P (Signed)
Hospital Admission Note Date: 02/18/2012  Patient name: [name] Medical record number: [id number] Date of birth: [birth year] Age: 46 y.o. Gender: male PCP: Dow Adolph, MD  Medical Service: Josefine Class Attending physician: Dr. Meredith Pel   1st Contact: Dr. Heloise Beecham   [phone] 2nd Contact: Dr. Bosie Clos   Pager: [phone] After 5 pm or weekends: 1st Contact:  Intern on call   Pager: [phone] 2nd Contact:  Resident on call  Pager: [phone]  Chief Complaint: Nausea/vomiting, abdominal pain  History of Present Illness: [name] is a 46 y.o. male with a PMHx of HTN, PVD, chronic renal insufficiency, and Type 1 DM w/ multiple toe amputations for diabetic foot ulcers presenting with one day of nausea, vomiting and abdominal pain. Patient reports that last night, he started having nausea, vomiting and stomach pain. He reports that emesis is brown and that he had 6-7 episodes last night. He has also noticed chills, but denies fever. He has had no diarrhea. His vision has also been blurrier than usual over the past couple of days.  He says that these are the symptoms he experiences when his blood sugar goes too high, and so he checked it last night and it was in the 500s. He says that he is prescribed Novolog 70/30 at home and is supposed to take 40 units in the morning and 20 units in the evening. He is supposed to monitor glucose at home and adjust insulin, but he cannot afford monitoring supplies and says he is unable to afford insulin even at a few dollars a bottle. He tries to use as little insulin as he can to make the bottle last longer. He also says he is unable to get transportation to doctor's visits and pharmacies [place]  He also has a history of multiple toe amputations; toes 1-4 on the left foot and the great toe of the right foot have been amputated. Most recently, during his last admission in August, his 2nd and 3rd toes on the left were amputated by Dr.  August Saucer due to probably osteomyelitis in the setting of diabetic foot ulcers. He was discharged to rehab at [facility] but said that he had to leave early over "legal issues." Over the past few days, he has noticed swelling and redness in his remaining L 5th toe. He cannot remember when it started because he does not look at toe often. He continues to report decreased sensation in bilateral lower extremities. He has specialized shoes at home to wear for his amputated toes but wears them irregularly. He reports constipation with one BM every 3 days, with alternating hard and loose stool. He denies any hematochezia or melena. He denies CP, palpitations; SOB, productive cough, rhinorrhea, sinus pain/pressure; dysuria, urgency, frequency, change in color of urine; diarrhea; weakness or new numbness.  Meds: Current Outpatient Rx  Name  Route  Sig  Dispense  Refill  . FLUOXETINE HCL 10 MG PO CAPS   Oral   Take 1 capsule (10 mg total) by mouth daily.   30 capsule   3   . INSULIN ASPART PROT & ASPART (70-30) 100 UNIT/ML [place] SUSP   Subcutaneous   Inject 20 Units into the skin 2 (two) times daily before a meal.          . PANTOPRAZOLE SODIUM 40 MG PO TBEC   Oral   Take 40 mg by mouth daily.           Allergies: Allergies  as of 02/18/2012  . (No Known Allergies)   Past Medical History  Diagnosis Date  . Onychomycosis   . Hypertension   . Diabetic peripheral neuropathy   . Diabetic foot ulcer 12/09    left plantar area  . History of drug abuse     Cocaine and marijuana  . Exposure to trichomonas     treated empirically  . Cellulitis of left foot 03/2008    left 4th and 5th metatarsal area  . Hyperlipidemia   . 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 Ulcerative esophagitis 2007    severe, complicated with UGI bleed  . Mallory Janann August tear April 2009  . History of prolonged Q-T interval on ECG   . 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    . 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 Chronic renal insufficiency   . Depression   . Peripheral vascular disease   . Type II diabetes mellitus   . GERD (gastroesophageal reflux disease)    Past Surgical History  Procedure Date  . Toe amputation 06/2011    left; 4th toe  . Nephrectomy     L side  . I&d extremity 08/24/2011    Procedure: IRRIGATION AND DEBRIDEMENT EXTREMITY;  Surgeon: Cammy Copa, MD;  Location: [facility] OR;  Service: Orthopedics;  Laterality: Left;  . Amputation 08/24/2011    Procedure: AMPUTATION RAY;  Surgeon: Cammy Copa, MD;  Location: [facility] OR;  Service: Orthopedics;  Laterality: Left;  left fourth toe Ray Resection  . Amputation 12/05/2011    Procedure: AMPUTATION RAY;  Surgeon: Cammy Copa, MD;  Location: [facility] OR;  Service: Orthopedics;  Laterality: Left;   Family History  Problem Relation Age of Onset  . Diabetes type II Mother   . Pancreatic cancer Father    History   Social History  . Marital Status: Married    Spouse Name: N/A    Number of Children: N/A  . Years of Education: N/A   Occupational History  . Not on file.   Social History Main Topics  . Smoking status: Current Some Day Smoker -- 0.5 packs/day for 34 years    Types: Cigarettes  . Smokeless tobacco: Never Used     Comment: Has stopped 2 weeks ago- QUIT line info given to pt and wife for asst with smoking patchers, etc  . Alcohol Use: Yes     Comment: 12/05/2011 "occasionally; maybe New Years"  . Drug Use: Yes    Special: Marijuana     Comment: 12/05/2011 "last time ~ 2 wk ago"  . Sexually Active: Yes   Other Topics Concern  . Not on file   Social History Narrative  . No narrative on file    Review of Systems: 10 pt ROS performed, pertinent positives and negatives noted in HPI  Physical Exam: Blood pressure 145/80, pulse 87, temperature 98 F (36.7 C), temperature source Oral, resp. rate 17, SpO2 100.00%. Gen: male, lying in bed,  sipping ice chips, NAD CV: RRR no m/r/g Pulm: CTAB Abd: Midline vertical surgical scar above the umbilicus. + BS, soft, nontender on my exam, no masses Extremities: 2+ bilateral DP and radial pulses. s/p surgical amputation of L toes 1-4 and R great toe. There is swelling, warmth, erythema and blister formation on L 5th toe. The tissue overlying heads of 1-4 metatarsal is dry, hyperkeratotic. There is no fluctuance, warmth or drainage. On the R  foot, there is a thick callus on the sole of the foot under the 1st metatarsal. It is nontender and non fluctuant. There is an open ulcer over the base of the 1st metatarsal that is draining a very small amount of clear-yellow fluid. There is no fluctuance or tenderness to palpation.  Neuro: A+Ox3, CN2-12 grossly intact. Full strength of b/l upper and lower extremities. Diminished sensation bilateral lower extremities up to mid shin. Decreased sensation over pads of fingers. 2+ bilateral patellar and brachial reflexes.   Lab results: Basic Metabolic Panel:  [facility] 02/18/12 0306  NA 138  K 3.7  CL 94*  CO2 26  GLUCOSE 356*  BUN 19  CREATININE 1.27  CALCIUM 9.8  MG --  PHOS --   Liver Function Tests:  [facility] 02/18/12 0306  AST 15  ALT 15  ALKPHOS 121*  BILITOT 0.4  PROT 7.9  ALBUMIN 3.7    Basename 02/18/12 0306  LIPASE 78*  AMYLASE 124*   CBC:  Basename 02/18/12 0306  WBC 11.5*  NEUTROABS --  HGB 12.5*  HCT 37.3*  MCV 88.2  PLT 320   Cardiac Enzymes:  Basename 02/18/12 0423  CKTOTAL --  CKMB --  CKMBINDEX --  TROPONINI <0.30   CBG:  Basename 02/18/12 1205 02/18/12 0909 02/18/12 0804 02/18/12 0702 02/18/12 0605 02/18/12 0503  GLUCAP 184* 193* 267* 329* 334* 335*   Urine Drug Screen: Drugs of Abuse     Component Value Date/Time   LABOPIA POSITIVE* 02/18/2012 0409   COCAINSCRNUR NONE DETECTED 02/18/2012 0409   COCAINSCRNUR NEG 09/22/2008 2125   LABBENZ NONE DETECTED 02/18/2012 0409   LABBENZ NEG 09/22/2008 2125     AMPHETMU NONE DETECTED 02/18/2012 0409   AMPHETMU NEG 09/22/2008 2125   THCU POSITIVE* 02/18/2012 0409   LABBARB NONE DETECTED 02/18/2012 0409    Urinalysis:  Basename 02/18/12 0409  COLORURINE YELLOW  LABSPEC 1.035*  PHURINE 6.5  GLUCOSEU >1000*  HGBUR SMALL*  BILIRUBINUR NEGATIVE  KETONESUR 40*  PROTEINUR 30*  UROBILINOGEN 1.0  NITRITE NEGATIVE  LEUKOCYTESUR NEGATIVE   Imaging results:  Mr Foot Left W Wo Contrast  02/18/2012  *RADIOLOGY REPORT*  Clinical Data: Multiple toe amputations, most recently August 2013. Erythema of the remaining fifth toe.  Diabetes.  Query osteomyelitis.  MRI OF THE LEFT FOREFOOT WITHOUT AND WITH CONTRAST  Technique:  Multiplanar, multisequence MR imaging was performed both before and after administration of intravenous contrast.  Contrast: 20mL MULTIHANCE GADOBENATE DIMEGLUMINE 529 MG/ML IV SOLN  Comparison: Multiple exams, including 02/18/2012 and 12/04/2011  Findings: Prior amputation of the first through fourth digits at the level of the metatarsals noted.  Osseous edema in the shaft and head of the fifth metatarsal as well as in the fifth digit phalanges noted with associated enhancement compatible with osteomyelitis.  There is also abnormal edema and enhancement compatible with chronic osteomyelitis in the shaft of the second, third, and fourth metatarsals with associated chronic periostitis especially in the second and third metatarsals.  There is chronic periostitis with irregularity of the distal margin and faintly increased edema and enhancement in the distal remaining shaft of the first metatarsal, favoring chronic osteomyelitis.  High signal laterally in the base of the first metatarsal is probably degenerative but could conceivably relate to osteomyelitis.  Lisfranc ligament is still intact.  There are degenerative findings at the Lisfranc joint.  No compelling findings of midfoot osteomyelitis in the visualized portion of the midfoot.  Regarding the  soft tissues, there is phlegmon surrounding  the distal portions of the first through fourth metatarsals along with apparent skin blistering or skin thickening along the prior amputation bed region distally, as well as diffuse infiltrative edema and enhancement in the soft tissues of the distal foot compatible with cellulitis and possibly fasciitis.  No well-defined drainable abscess is observed.  Cellulitis and enhancement extend diffusely in the soft tissues of the fifth toe.  IMPRESSION:  1.  Osteomyelitis of the fifth metatarsal and fifth toe phalanges, with surrounding cellulitis. 2.  Chronic osteomyelitis involving the first through fourth metatarsals. 3.  Diffuse cellulitis with inflammatory phlegmon most concentrated around the heads of the first through fourth metatarsals, potentially with the distal blistering/skin thickening.  A well- defined drainable soft tissue abscess is not observed.   Original Report Authenticated By: Gaylyn Rong, M.D.    Dg Foot Complete Left  02/18/2012  *RADIOLOGY REPORT*  Clinical Data: Swelling and blistering of left fifth toe. History of multiple toe amputations and diabetes.  LEFT FOOT - COMPLETE 3+ VIEW  Comparison: Left second toe radiographs and left foot MRI performed 12/04/2011  Findings: There is no definite evidence of osseous erosion at the fifth toe to suggest osteomyelitis.  The left fifth toe appears grossly intact.  There is amputation across the first through fourth metatarsals.  Visualized joint spaces are grossly preserved.  Soft tissue swelling is noted at the sites of amputation.  IMPRESSION: No definite evidence of osseous erosion at the fifth toe to suggest osteomyelitis; the fifth toe appears grossly intact.   Original Report Authenticated By: Tonia Ghent, M.D.     Other results: Normal sinus rhythm with rate of 71, normal intervals, L axis deviation, no ST segment elevation/depression. When compared to prior 9/17.13, no significant change.     Assessment & Plan by Problem: Mr. Heindl is a 46 y.o. male with a PMHx of HTN, PVD, chronic renal insufficiency, and Type 1 DM w/ multiple toe amputations for diabetic foot ulcers presenting in diabetic ketoacidosis in setting of poor insulin compliance and osteomyelitis/cellulitis of left 5th toe.   1) Diabetic Ketoacidosis On presentation, patient's BG was 359 w AG of 18. Ketones in urine and small blood acetone. Lactic acid negative.  Suspect DKA secondary to non-compliance w insulin therapy with infection of L 5th  toe possibly contributing. Urinalysis does not demonstrate infection. Delta-delta ratio suggests concomitant metabolic alkalosis, likely 2/2 vomiting. Insulin drip was started in the ED. Arterial blood gas was performed w pH of 7.43 and bicarb of 26, this was drawn 6 hours after initial metabolic panel. - Insulin drip per DKA protocol. Continue IV insulin until negative AGx2, then transition to SQ insulin with 2 hours overlap. - Supplement K, was 3.7 on admission - Will need counseling and help with med access prior to discharge.  2) Diabetic foot ulcer/ 5th L toe osteomyelitis/cellulitis Pt's L 5th toe is warm, red, and tense. MRI shows acute osteomyelitis L 5th toe as well as celluitis changes. MRI also demonstrated cellulitis and chronic osteomyelitis of 1-4 metatarsal heads. Patient does have modest leukocytosis (11.5), but has remained afebrile. Vancomycin and zosyn were started in the ED to cover for polymicrobial wound/bone infection. No lower extremity edema of either leg. - Ortho consulted [facility]), will evaluate patient - Blood cultures ordered after pt received abx - Continue vanc, zosyn  3) Type 1 Diabetes mellitus  Patient says he is on 70/30 Novolog at home and is supposed to take 40U in am and 20U in pm. He is  not compliant with this medication. Has been on lantus in past but cannot afford. He alos has had issues with hypoglycemia during prior admissions/ED  presentations. Will need to reevaluate home regimen prior to discharge and he will need med assistance. It looks like from his last office visit w Dr. Zada Girt that he was supposed to be on Humalog 75/25 36U in am and 30U at suppertime. - IV insulin and DKA protocol for now, as above  4) Hypertension BP is 140s/80s this morning. From review of office note, it appears that he is on Coreg 6.25 BID, Accuretic 20-12.5 two tablets daily, and amlodipine 5mg  daily. - Will restart antihypertensives when tolerating po.   5) Chronic renal insufficiency Secondary to diabetic nephropathy, patient also s/p nephrectomy of L kidney in 2010 2/2 chronic pyelonephritis and pyonephrosis. Cr is 1.27 today, down from 1.52 at prior clinic visit. Looks like baseline over the past few years has been 1.3.  - IVF hydration, follow Cr  6) Depression  Patient on Prozac at home. - Will restart when taking po  Signed: Bronson Curb 02/18/2012, 12:18 PM

## 2012-02-18 NOTE — Consult Note (Signed)
Reason for Consult:  Left foot infection Referring Physician:   Family Medicine Teaching Service  [name] is an 46 y.o. male.  HPI:   47 yo male with poorly controlled diabetes and a history of multiple infections involving his left foot.  He has had previous amputations of his great toe as well as his 2nd-4th rays.  He presents now with infection involving his left 5th toe and MRI findings consistent with residual infection/osteo in his other metatarsals.  He has also had increased pain as well as nausea/vomitting.  Ortho is consulted to address the foot.  Past Medical History  Diagnosis Date  . Onychomycosis   . Hypertension   . Diabetic peripheral neuropathy   . Diabetic foot ulcer 12/09    left plantar area  . History of drug abuse     Cocaine and marijuana  . Exposure to trichomonas     treated empirically  . Cellulitis of left foot 03/2008    left 4th and 5th metatarsal area  . Hyperlipidemia   . 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 Ulcerative esophagitis 2007    severe, complicated with UGI bleed  . Mallory Janann August tear April 2009  . History of prolonged Q-T interval on ECG   . 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  . 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 Chronic renal insufficiency   . Depression   . Peripheral vascular disease   . Type II diabetes mellitus   . GERD (gastroesophageal reflux disease)     Past Surgical History  Procedure Date  . Toe amputation 06/2011    left; 4th toe  . Nephrectomy     L side  . I&d extremity 08/24/2011    Procedure: IRRIGATION AND DEBRIDEMENT EXTREMITY;  Surgeon: Cammy Copa, MD;  Location: [facility] OR;  Service: Orthopedics;  Laterality: Left;  . Amputation 08/24/2011    Procedure: AMPUTATION RAY;  Surgeon: Cammy Copa, MD;  Location: [facility] OR;  Service: Orthopedics;  Laterality: Left;  left fourth toe Ray Resection  . Amputation 12/05/2011   Procedure: AMPUTATION RAY;  Surgeon: Cammy Copa, MD;  Location: [facility] OR;  Service: Orthopedics;  Laterality: Left;    Family History  Problem Relation Age of Onset  . Diabetes type II Mother   . Pancreatic cancer Father     Social History:  reports that he has been smoking Cigarettes.  He has a 17 pack-year smoking history. He has never used smokeless tobacco. He reports that he drinks alcohol. He reports that he uses illicit drugs (Marijuana).  Allergies: No Known Allergies  Medications: I have reviewed the patient's current medications.  Results for orders placed during the hospital encounter of 02/18/12 (from the past 48 hour(s))  GLUCOSE, CAPILLARY     Status: Abnormal   Collection Time   02/18/12  2:14 AM      Component Value Range Comment   Glucose-Capillary 359 (*) 70 - 99 mg/dL   CBC     Status: Abnormal   Collection Time   02/18/12  3:06 AM      Component Value Range Comment   WBC 11.5 (*) 4.0 - 10.5 K/uL    RBC 4.23  4.22 - 5.81 MIL/uL    Hemoglobin 12.5 (*) 13.0 - 17.0 g/dL    HCT 65.7 (*) 84.6 - 52.0 %    MCV 88.2  78.0 - 100.0 fL  MCH 29.6  26.0 - 34.0 pg    MCHC 33.5  30.0 - 36.0 g/dL    RDW 81.1  91.4 - 78.2 %    Platelets 320  150 - 400 K/uL   COMPREHENSIVE METABOLIC PANEL     Status: Abnormal   Collection Time   02/18/12  3:06 AM      Component Value Range Comment   Sodium 138  135 - 145 mEq/L    Potassium 3.7  3.5 - 5.1 mEq/L    Chloride 94 (*) 96 - 112 mEq/L    CO2 26  19 - 32 mEq/L    Glucose, Bld 356 (*) 70 - 99 mg/dL    BUN 19  6 - 23 mg/dL    Creatinine, Ser 9.56  0.50 - 1.35 mg/dL    Calcium 9.8  8.4 - 21.3 mg/dL    Total Protein 7.9  6.0 - 8.3 g/dL    Albumin 3.7  3.5 - 5.2 g/dL    AST 15  0 - 37 U/L    ALT 15  0 - 53 U/L    Alkaline Phosphatase 121 (*) 39 - 117 U/L    Total Bilirubin 0.4  0.3 - 1.2 mg/dL    GFR calc non Af Amer 66 (*) >90 mL/min    GFR calc Af Amer 77 (*) >90 mL/min   LIPASE, BLOOD     Status: Abnormal    Collection Time   02/18/12  3:06 AM      Component Value Range Comment   Lipase 78 (*) 11 - 59 U/L   KETONES, QUALITATIVE     Status: Abnormal   Collection Time   02/18/12  3:06 AM      Component Value Range Comment   Acetone, Bld SMALL (*) NEGATIVE   AMYLASE     Status: Abnormal   Collection Time   02/18/12  3:06 AM      Component Value Range Comment   Amylase 124 (*) 0 - 105 U/L   LACTIC ACID, PLASMA     Status: Normal   Collection Time   02/18/12  3:36 AM      Component Value Range Comment   Lactic Acid, Venous 1.3  0.5 - 2.2 mmol/L   URINALYSIS, ROUTINE W REFLEX MICROSCOPIC     Status: Abnormal   Collection Time   02/18/12  4:09 AM      Component Value Range Comment   Color, Urine YELLOW  YELLOW    APPearance CLEAR  CLEAR    Specific Gravity, Urine 1.035 (*) 1.005 - 1.030    pH 6.5  5.0 - 8.0    Glucose, UA >1000 (*) NEGATIVE mg/dL    Hgb urine dipstick SMALL (*) NEGATIVE    Bilirubin Urine NEGATIVE  NEGATIVE    Ketones, ur 40 (*) NEGATIVE mg/dL    Protein, ur 30 (*) NEGATIVE mg/dL    Urobilinogen, UA 1.0  0.0 - 1.0 mg/dL    Nitrite NEGATIVE  NEGATIVE    Leukocytes, UA NEGATIVE  NEGATIVE   URINE RAPID DRUG SCREEN (HOSP PERFORMED)     Status: Abnormal   Collection Time   02/18/12  4:09 AM      Component Value Range Comment   Opiates POSITIVE (*) NONE DETECTED    Cocaine NONE DETECTED  NONE DETECTED    Benzodiazepines NONE DETECTED  NONE DETECTED    Amphetamines NONE DETECTED  NONE DETECTED    Tetrahydrocannabinol POSITIVE (*) NONE DETECTED  Barbiturates NONE DETECTED  NONE DETECTED   URINE MICROSCOPIC-ADD ON     Status: Normal   Collection Time   02/18/12  4:09 AM      Component Value Range Comment   Squamous Epithelial / LPF RARE  RARE    WBC, UA 0-2  <3 WBC/hpf    RBC / HPF 3-6  <3 RBC/hpf    Bacteria, UA RARE  RARE   TROPONIN I     Status: Normal   Collection Time   02/18/12  4:23 AM      Component Value Range Comment   Troponin I <0.30  <0.30 ng/mL     GLUCOSE, CAPILLARY     Status: Abnormal   Collection Time   02/18/12  5:03 AM      Component Value Range Comment   Glucose-Capillary 335 (*) 70 - 99 mg/dL   GLUCOSE, CAPILLARY     Status: Abnormal   Collection Time   02/18/12  6:05 AM      Component Value Range Comment   Glucose-Capillary 334 (*) 70 - 99 mg/dL   GLUCOSE, CAPILLARY     Status: Abnormal   Collection Time   02/18/12  7:02 AM      Component Value Range Comment   Glucose-Capillary 329 (*) 70 - 99 mg/dL   GLUCOSE, CAPILLARY     Status: Abnormal   Collection Time   02/18/12  8:04 AM      Component Value Range Comment   Glucose-Capillary 267 (*) 70 - 99 mg/dL   POCT I-STAT 3, BLOOD GAS (G3+)     Status: Abnormal   Collection Time   02/18/12  9:06 AM      Component Value Range Comment   pH, Arterial 7.430  7.350 - 7.450    pCO2 arterial 39.5  35.0 - 45.0 mmHg    pO2, Arterial 96.0  80.0 - 100.0 mmHg    Bicarbonate 26.3 (*) 20.0 - 24.0 mEq/L    TCO2 27  0 - 100 mmol/L    O2 Saturation 98.0      Acid-Base Excess 2.0  0.0 - 2.0 mmol/L    Collection site RADIAL, ALLEN'S TEST ACCEPTABLE      Drawn by Operator      Sample type ARTERIAL     GLUCOSE, CAPILLARY     Status: Abnormal   Collection Time   02/18/12  9:09 AM      Component Value Range Comment   Glucose-Capillary 193 (*) 70 - 99 mg/dL   GLUCOSE, CAPILLARY     Status: Abnormal   Collection Time   02/18/12 12:05 PM      Component Value Range Comment   Glucose-Capillary 184 (*) 70 - 99 mg/dL     Mr Foot Left W Wo Contrast  02/18/2012  *RADIOLOGY REPORT*  Clinical Data: Multiple toe amputations, most recently August 2013. Erythema of the remaining fifth toe.  Diabetes.  Query osteomyelitis.  MRI OF THE LEFT FOREFOOT WITHOUT AND WITH CONTRAST  Technique:  Multiplanar, multisequence MR imaging was performed both before and after administration of intravenous contrast.  Contrast: 20mL MULTIHANCE GADOBENATE DIMEGLUMINE 529 MG/ML IV SOLN  Comparison: Multiple exams,  including 02/18/2012 and 12/04/2011  Findings: Prior amputation of the first through fourth digits at the level of the metatarsals noted.  Osseous edema in the shaft and head of the fifth metatarsal as well as in the fifth digit phalanges noted with associated enhancement compatible with osteomyelitis.  There is also abnormal edema  and enhancement compatible with chronic osteomyelitis in the shaft of the second, third, and fourth metatarsals with associated chronic periostitis especially in the second and third metatarsals.  There is chronic periostitis with irregularity of the distal margin and faintly increased edema and enhancement in the distal remaining shaft of the first metatarsal, favoring chronic osteomyelitis.  High signal laterally in the base of the first metatarsal is probably degenerative but could conceivably relate to osteomyelitis.  Lisfranc ligament is still intact.  There are degenerative findings at the Lisfranc joint.  No compelling findings of midfoot osteomyelitis in the visualized portion of the midfoot.  Regarding the soft tissues, there is phlegmon surrounding the distal portions of the first through fourth metatarsals along with apparent skin blistering or skin thickening along the prior amputation bed region distally, as well as diffuse infiltrative edema and enhancement in the soft tissues of the distal foot compatible with cellulitis and possibly fasciitis.  No well-defined drainable abscess is observed.  Cellulitis and enhancement extend diffusely in the soft tissues of the fifth toe.  IMPRESSION:  1.  Osteomyelitis of the fifth metatarsal and fifth toe phalanges, with surrounding cellulitis. 2.  Chronic osteomyelitis involving the first through fourth metatarsals. 3.  Diffuse cellulitis with inflammatory phlegmon most concentrated around the heads of the first through fourth metatarsals, potentially with the distal blistering/skin thickening.  A well- defined drainable soft tissue  abscess is not observed.   Original Report Authenticated By: Gaylyn Rong, M.D.    Dg Foot Complete Left  02/18/2012  *RADIOLOGY REPORT*  Clinical Data: Swelling and blistering of left fifth toe. History of multiple toe amputations and diabetes.  LEFT FOOT - COMPLETE 3+ VIEW  Comparison: Left second toe radiographs and left foot MRI performed 12/04/2011  Findings: There is no definite evidence of osseous erosion at the fifth toe to suggest osteomyelitis.  The left fifth toe appears grossly intact.  There is amputation across the first through fourth metatarsals.  Visualized joint spaces are grossly preserved.  Soft tissue swelling is noted at the sites of amputation.  IMPRESSION: No definite evidence of osseous erosion at the fifth toe to suggest osteomyelitis; the fifth toe appears grossly intact.   Original Report Authenticated By: Tonia Ghent, M.D.     Review of Systems  Constitutional: Positive for fever and chills.  Gastrointestinal: Positive for nausea and vomiting.   Blood pressure 143/84, pulse 87, temperature 98 F (36.7 C), temperature source Oral, resp. rate 16, SpO2 100.00%. Physical Exam  Musculoskeletal:       Left foot: He exhibits swelling.       Feet:    Assessment/Plan: Left foot 5th toe infection/osteo and failure of previous metatarsal amputations with residual infection 1) to the OR today for a left foot 5th toes amputation, irrigation/debridement of left forefoot, and revision transmetatarsal amputation. 2) informed consent obtained (risks and benefits explained and understood completely)  Adysson Revelle Y 02/18/2012, 1:40 PM

## 2012-02-18 NOTE — Preoperative (Signed)
Beta Blockers   Reason not to administer Beta Blockers:Not Applicable 

## 2012-02-18 NOTE — Brief Op Note (Signed)
02/18/2012  8:33 PM  PATIENT:  [name]  46 y.o. male  PRE-OPERATIVE DIAGNOSIS:  infected foot  POST-OPERATIVE DIAGNOSIS:  infected foot  PROCEDURE:  Procedure(s) (LRB) with comments: IRRIGATION AND DEBRIDEMENT EXTREMITY (Left) AMPUTATION DIGIT (Left) - revison transmetatarsal  SURGEON:  Surgeon(s) and Role:    * Kathryne Hitch, MD - Primary  PHYSICIAN ASSISTANT:   ASSISTANTS: none   ANESTHESIA:   general  EBL:   minimal  BLOOD ADMINISTERED:none  DRAINS: none   LOCAL MEDICATIONS USED:  NONE  SPECIMEN:  No Specimen  DISPOSITION OF SPECIMEN:  N/A  COUNTS:  YES  TOURNIQUET:  * Missing tourniquet times found for documented tourniquets in log:  [id number] *  DICTATION: .Other Dictation: Dictation Number [id number]  PLAN OF CARE: Admit to inpatient   PATIENT DISPOSITION:  PACU - hemodynamically stable.   Delay start of Pharmacological VTE agent (>24hrs) due to surgical blood loss or risk of bleeding: no

## 2012-02-18 NOTE — Progress Notes (Signed)
ANTIBIOTIC CONSULT NOTE - INITIAL  Pharmacy Consult for Vanco/Zosyn Indication: Osteo  No Known Allergies  Patient Measurements:   Adjusted Body Weight: 103.2 kg  Vital Signs: Temp: 98.5 F (36.9 C) (11/10 1319) Temp src: Oral (11/10 1319) BP: 164/91 mmHg (11/10 1319) Pulse Rate: 83  (11/10 1319) Intake/Output from previous day:   Intake/Output from this shift: Total I/O In: -  Out: 300 [Urine:300]  Labs:  [facility] 02/18/12 1547 02/18/12 1431 02/18/12 0306  WBC -- 15.0* 11.5*  HGB -- 12.3* 12.5*  PLT -- 319 320  LABCREA -- -- --  CREATININE 1.32 1.31 1.27   The CrCl is unknown because both a height and weight (above a minimum accepted value) are required for this calculation. No results found for this basename: VANCOTROUGH:2,VANCOPEAK:2,VANCORANDOM:2,GENTTROUGH:2,GENTPEAK:2,GENTRANDOM:2,TOBRATROUGH:2,TOBRAPEAK:2,TOBRARND:2,AMIKACINPEAK:2,AMIKACINTROU:2,AMIKACIN:2, in the last 72 hours   Microbiology: No results found for this or any previous visit (from the past 720 hour(s)).  Medical History: Past Medical History  Diagnosis Date  . Onychomycosis   . Hypertension   . Diabetic peripheral neuropathy   . Diabetic foot ulcer 12/09    left plantar area  . History of drug abuse     Cocaine and marijuana  . Exposure to trichomonas     treated empirically  . Cellulitis of left foot 03/2008    left 4th and 5th metatarsal area  . Hyperlipidemia   . 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 Ulcerative esophagitis 2007    severe, complicated with UGI bleed  . Mallory Janann August tear April 2009  . History of prolonged Q-T interval on ECG   . 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  . 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 Chronic renal insufficiency   . Depression   . Peripheral vascular disease   . Type II diabetes mellitus   . GERD (gastroesophageal reflux disease)     Medications:    Prescriptions prior to admission  Medication Sig Dispense Refill  . FLUoxetine (PROZAC) 10 MG capsule Take 1 capsule (10 mg total) by mouth daily.  30 capsule  3  . insulin aspart protamine-insulin aspart (NOVOLOG 70/30) (70-30) 100 UNIT/ML injection Inject 20 Units into the skin 2 (two) times daily before a meal.       . pantoprazole (PROTONIX) 40 MG tablet Take 40 mg by mouth daily.       Assessment: N/V, abdominal pain. Noncompliant with insulin. Blistering and swelling of L foot 5th toe. Has had previous gangrene and toe amputations. Significant PMH.  Goal of Therapy:  Vancomycin trough level 15-20 mcg/ml  Plan:  OR Zosyn 3.375g IV q8h. Vanco 1g IV q12h. Trough after 3-5 doses at steady state.  Merilynn Finland, [employer] 02/18/2012,6:18 PM

## 2012-02-18 NOTE — Anesthesia Preprocedure Evaluation (Addendum)
Anesthesia Evaluation  Patient identified by MRN, date of birth, ID band Patient awake    Reviewed: Allergy & Precautions, H&P , NPO status , Patient's Chart, lab work & pertinent test results  Airway Mallampati: I TM Distance: >3 FB Neck ROM: full    Dental  (+) Teeth Intact and Dental Advisory Given   Pulmonary Current Smoker,          Cardiovascular hypertension, Pt. on medications + Peripheral Vascular Disease - dysrhythmias Rhythm:regular Rate:Normal     Neuro/Psych PSYCHIATRIC DISORDERS Depression    GI/Hepatic PUD, GERD-  Medicated,  Endo/Other  diabetes, Type 2, Insulin Dependent  Renal/GU CRFRenal disease     Musculoskeletal negative musculoskeletal ROS (+)   Abdominal   Peds  Hematology negative hematology ROS (+)   Anesthesia Other Findings   Reproductive/Obstetrics                       Anesthesia Physical Anesthesia Plan  ASA: III and emergent  Anesthesia Plan: General   Post-op Pain Management:    Induction: Intravenous  Airway Management Planned: LMA  Additional Equipment:   Intra-op Plan:   Post-operative Plan: Extubation in OR  Informed Consent: I have reviewed the patients History and Physical, chart, labs and discussed the procedure including the risks, benefits and alternatives for the proposed anesthesia with the patient or authorized representative who has indicated his/her understanding and acceptance.   Dental advisory given  Plan Discussed with: CRNA, Anesthesiologist and Surgeon  Anesthesia Plan Comments:        Anesthesia Quick Evaluation

## 2012-02-18 NOTE — Anesthesia Postprocedure Evaluation (Signed)
  Anesthesia Post-op Note  Patient: [name]  Procedure(s) Performed: Procedure(s) (LRB) with comments: IRRIGATION AND DEBRIDEMENT EXTREMITY (Left) AMPUTATION DIGIT (Left) - revison transmetatarsal  Patient Location: PACU  Anesthesia Type:General  Level of Consciousness: awake, alert , oriented and patient cooperative  Airway and Oxygen Therapy: Patient Spontanous Breathing  Post-op Pain: mild  Post-op Assessment: Post-op Vital signs reviewed, Patient's Cardiovascular Status Stable, Respiratory Function Stable, Patent Airway, No signs of Nausea or vomiting and Pain level controlled  Post-op Vital Signs: stable  Complications: No apparent anesthesia complications

## 2012-02-18 NOTE — ED Provider Notes (Addendum)
History     CSN: [id number]  Arrival date & time 02/18/12  0205   First MD Initiated Contact with Patient 02/18/12 0236      Chief Complaint  Patient presents with  . Emesis    (Consider location/radiation/quality/duration/timing/severity/associated sxs/prior treatment) HPI Comments: [name] presents for evaluation of abdominal pain, nausea, and vomiting.  He reports he has insulin dependent diabetes and has not been taking the insulin regularly.  For the last 2 days he has felt ill but yesterday afternoon developed abdominal cramping, nausea, and vomiting.  "I think I'm getting sick again."  He reports he has noticed blistering and swelling of the 5th toe on his left foot.  He had similar changes previously before infection and gangrene necessitated amputations of toes.  He denies fever, chest pain, SOB, palpitations, cough, diarrhea, hematochezia, and melena.  The history is provided by the patient. No language interpreter was used.    Past Medical History  Diagnosis Date  . Onychomycosis   . Hypertension   . Diabetic peripheral neuropathy   . Diabetic foot ulcer 12/09    left plantar area  . History of drug abuse     Cocaine and marijuana  . Exposure to trichomonas     treated empirically  . Cellulitis of left foot 03/2008    left 4th and 5th metatarsal area  . Hyperlipidemia   . 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 Ulcerative esophagitis 2007    severe, complicated with UGI bleed  . Mallory Janann August tear April 2009  . History of prolonged Q-T interval on ECG   . 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  . 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 Chronic renal insufficiency   . Depression   . Peripheral vascular disease   . Type II diabetes mellitus   . GERD (gastroesophageal reflux disease)     Past Surgical History  Procedure Date  . Toe amputation 06/2011    left; 4th toe  . Nephrectomy      R side  . I&d extremity 08/24/2011    Procedure: IRRIGATION AND DEBRIDEMENT EXTREMITY;  Surgeon: Cammy Copa, MD;  Location: [facility] OR;  Service: Orthopedics;  Laterality: Left;  . Amputation 08/24/2011    Procedure: AMPUTATION RAY;  Surgeon: Cammy Copa, MD;  Location: [facility] OR;  Service: Orthopedics;  Laterality: Left;  left fourth toe Ray Resection  . Amputation 12/05/2011    Procedure: AMPUTATION RAY;  Surgeon: Cammy Copa, MD;  Location: [facility] OR;  Service: Orthopedics;  Laterality: Left;    No family history on file.  History  Substance Use Topics  . Smoking status: Current Some Day Smoker -- 0.5 packs/day for 34 years    Types: Cigarettes  . Smokeless tobacco: Never Used     Comment: Has stopped 2 weeks ago- QUIT line info given to pt and wife for asst with smoking patchers, etc  . Alcohol Use: Yes     Comment: 12/05/2011 "occasionally; maybe New Years"      Review of Systems  Constitutional: Positive for appetite change and fatigue. Negative for fever, chills, diaphoresis and activity change.  HENT: Negative for congestion, sore throat, rhinorrhea, trouble swallowing, neck pain and neck stiffness.   Eyes: Negative.   Respiratory: Negative for cough, chest tightness, shortness of breath and wheezing.   Cardiovascular: Negative for chest pain and palpitations.  Gastrointestinal: Positive for nausea, vomiting and abdominal  pain. Negative for diarrhea, blood in stool and abdominal distention.  Genitourinary: Negative for dysuria, hematuria, flank pain and difficulty urinating.  Musculoskeletal: Positive for arthralgias. Negative for myalgias and joint swelling.  Skin: Positive for color change and wound. Negative for pallor and rash.  Neurological: Positive for light-headedness. Negative for dizziness, seizures, syncope, weakness and headaches.  Hematological: Does not bruise/bleed easily.  Psychiatric/Behavioral: Negative.     Allergies  Review of patient's  allergies indicates no known allergies.  Home Medications   Current Outpatient Rx  Name  Route  Sig  Dispense  Refill  . FLUOXETINE HCL 10 MG PO CAPS   Oral   Take 1 capsule (10 mg total) by mouth daily.   30 capsule   3   . INSULIN ASPART PROT & ASPART (70-30) 100 UNIT/ML [place] SUSP   Subcutaneous   Inject 20 Units into the skin 2 (two) times daily before a meal.          . PANTOPRAZOLE SODIUM 40 MG PO TBEC   Oral   Take 40 mg by mouth daily.           BP 194/96  Pulse 79  Temp 98.1 F (36.7 C) (Oral)  Resp 20  SpO2 100%  Physical Exam  Nursing note and vitals reviewed. Constitutional: He is oriented to person, place, and time. He appears well-developed. He is cooperative.  Non-toxic appearance. He has a sickly appearance. He appears ill. No distress. He is not intubated.  HENT:  Head: Normocephalic and atraumatic. Head is without right periorbital erythema and without left periorbital erythema. No trismus in the jaw.  Right Ear: External ear normal.  Left Ear: External ear normal.  Nose: No rhinorrhea. No epistaxis. Right sinus exhibits no maxillary sinus tenderness and no frontal sinus tenderness. Left sinus exhibits no maxillary sinus tenderness.  Mouth/Throat: Uvula is midline. Mucous membranes are not pale, dry and not cyanotic. Dental caries present. No uvula swelling. No oropharyngeal exudate, posterior oropharyngeal edema, posterior oropharyngeal erythema or tonsillar abscesses.  Eyes: EOM and lids are normal. Pupils are equal, round, and reactive to light. Right eye exhibits no exudate. Left eye exhibits no exudate. Right conjunctiva is not injected. Right conjunctiva has no hemorrhage. Left conjunctiva is not injected. Left conjunctiva has no hemorrhage. No scleral icterus. Right eye exhibits no nystagmus. Left eye exhibits no nystagmus. Right pupil is round and reactive. Pupils are equal.  Neck: Trachea normal, normal range of motion and phonation normal. Neck  supple. No JVD present. No tracheal tenderness, no spinous process tenderness and no muscular tenderness present. Carotid bruit is not present. No rigidity. No tracheal deviation, no edema, no erythema and normal range of motion present.  Cardiovascular: Normal rate, regular rhythm, S1 normal, S2 normal, normal heart sounds, intact distal pulses and normal pulses.   No extrasystoles are present. PMI is not displaced.  Exam reveals no gallop, no distant heart sounds, no friction rub and no decreased pulses.   No murmur heard. Pulmonary/Chest: Breath sounds normal. No accessory muscle usage or stridor. No apnea, not tachypneic and not bradypneic. He is not intubated. No respiratory distress. He has no decreased breath sounds. He has no wheezes. He has no rhonchi. He has no rales.  Abdominal: Normal appearance. He exhibits no abdominal bruit, no ascites, no pulsatile midline mass and no mass. Bowel sounds are decreased. There is no hepatosplenomegaly. There is generalized tenderness. There is guarding. There is no rigidity, no rebound, no tenderness at McBurney's point  and negative Murphy's sign.       No previous surgical incision scars.  Musculoskeletal: Normal range of motion. He exhibits no edema and no tenderness.       Note amputations of toes 1-4 on the left foot and all on the right.  There is swelling nand mild erythema to the left 5th toe.  Note a blood filled blister on the lateral aspect of the toe.  Neurological: He is alert and oriented to person, place, and time. No cranial nerve deficit.  Skin: Skin is warm and dry. No bruising, no burn and no laceration noted. He is not diaphoretic. There is erythema (left 5th toe - very mild). No cyanosis. Nails show no clubbing.  Psychiatric: His speech is normal. Cognition and memory are normal. He exhibits a depressed mood.    ED Course  Procedures (including critical care time)  Labs Reviewed  GLUCOSE, CAPILLARY - Abnormal; Notable for the  following:    Glucose-Capillary 359 (*)     All other components within normal limits  CBC  COMPREHENSIVE METABOLIC PANEL  LIPASE, BLOOD  URINALYSIS, ROUTINE W REFLEX MICROSCOPIC  URINE RAPID DRUG SCREEN (HOSP PERFORMED)  KETONES, QUALITATIVE   No results found.   No diagnosis found.   Date: 02/18/2012  Rate: 71 bpm  Rhythm: sinus  QRS Axis: left  Intervals: normal  ST/T Wave abnormalities: normal  Conduction Disutrbances:none  Narrative Interpretation: no STEMI or dysrhythmia   Old EKG Reviewed: none available      MDM  Pt presents for evaluation of nausea and vomiting.  He reports his blood sugars have been running high (over 300) for over a week and he feels as if his left 5th toe is becoming infected and gangrenous.  He is afebrile, appears uncomfortable, noted elevated BP, NAD.  He also appears dehydrated.  Plan symptomatic control with zofran, reglan, and morphine.  He is burping constantly and has a history of severe reflux.  Will also administer protonix.  Will obtain belly labs, a urinalysis, drug screen, and serum ketones.  He does have blistering of the left 5th toe but no obvious erythema.  Will xray the left foot and toe also.  Anticipate he may have an anion gap and require treatment for DKA or severe hyperglycemic crisis.  Will bouls IVF while awaiting lab results.  1610.  Pt stable, NAD.  Complains of continued pain after receiving 4 mg of morphine.  Ordered dilaudid.  Note a very mildly elevated amylase and lipase.  Although his blood sugar is only 356 and he has only a small amount of serum ketones, he has a calculated anion gap of 21.7.  Ordered an insulin gtt.  Also placed consult with the hospitalist for admission and further mgmnt.  X-ray of the left foot demonstrates no evidence of osteomyelitis.  Will cover with antibiotics because there are some changes noted to the left 5th toe concerning for early infection.  Will also obtain an EKG and troponin.  CRITICAL  CARE Performed by: Dana Allan T   Total critical care time: 30  Critical care time was exclusive of separately billable procedures and treating other patients.  Critical care was necessary to treat or prevent imminent or life-threatening deterioration.  Critical care was time spent personally by me on the following activities: development of treatment plan with patient and/or surrogate as well as nursing, discussions with consultants, evaluation of patient's response to treatment, examination of patient, obtaining history from patient or surrogate, ordering and performing  treatments and interventions, ordering and review of laboratory studies, ordering and review of radiographic studies, pulse oximetry and re-evaluation of patient's condition.     Tobin Chad, MD 02/18/12 9629  Tobin Chad, MD 02/18/12 5284  Tobin Chad, MD 02/18/12 [phone]

## 2012-02-18 NOTE — H&P (Signed)
Internal Medicine Teaching Service Resident Admission Note Date: 02/18/2012  Patient name: DEQUANN VANDERVELDEN Medical record number: [id number] Date of birth: Oct 06, 1965 Age: 46 y.o. Gender: male PCP: Dow Adolph, MD  Medical Service: Josefine Class  I have reviewed the note by Frederik Pear MS3 and was present during the interview and physical exam.  Please see my separate note for findings, assessment, and plan.  Signed: Bronson Curb 02/18/2012, 2:50 PM

## 2012-02-18 NOTE — Transfer of Care (Signed)
Immediate Anesthesia Transfer of Care Note  Patient: [name]  Procedure(s) Performed: Procedure(s) (LRB) with comments: IRRIGATION AND DEBRIDEMENT EXTREMITY (Left) AMPUTATION DIGIT (Left) - revison transmetatarsal  Patient Location: PACU  Anesthesia Type:General  Level of Consciousness: awake, oriented and patient cooperative  Airway & Oxygen Therapy: Patient Spontanous Breathing and Patient connected to nasal cannula oxygen  Post-op Assessment: Report given to PACU RN, Post -op Vital signs reviewed and stable and Patient moving all extremities  Post vital signs: Reviewed and stable  Complications: No apparent anesthesia complications

## 2012-02-18 NOTE — ED Notes (Signed)
PER EMS- Patient started having nausea and vomiting since 2pm yesterday. Patient has productive cough. Complain of mid sternal CP that increases when he coughs. Alertx4, NAD. Lungs have rhonchi but otherwise clear. Hx- DM, HTN, Kidney removed, L toe amputation. Pt is currently hypertensive. CBG-368. NDKA. 18 L arm. EMS gave 4 mg of Zofran.

## 2012-02-18 NOTE — H&P (Signed)
[facility] Admission Note Date: 02/18/2012  Patient name: RAYFORD WILLIAMSEN Medical record number: [id number] Date of birth: 13-Feb-1966 Age: 46 y.o. Gender: male PCP: Dow Adolph, MD   Chief Complaint:  Nausea/Vomiting   History was gathered from patient and wife both seem to be reliable historians  History of Present Illness:  Mr. 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  A similar episode that included diarrhea occurred last week which resolved on its own, the pt endorsed his step son having a "stomach bug"  after which he began having vomiting and diarrhea. This current episode began yesterday 11/9 with mid epigastric pain that the pt described as constipation (his last BM was 3 days ago, the pt reports irregular BM's with an average of 3-4 days in between [facility]), he attempted to relieve the pain with an enema with no relief shortly after the pain progressed to nausea.  It was at this time that his wife began checking his blood sugar and dosing insulin (586 -> 440 -> 100's -> ~300).  After the pt's sugars began to trend upward he started vomiting.  Pt describes the emesis as a red-brown color.  Pt decided to come into the hospital when vomiting was not remitting and general constitution was declining.  Pt denies fever but endorses chills, diaphoresis, and intermittent blurred vision.  He also denies recent alcohol use.  Wife denies altered mental status.  Pt has recently been inconsistent with his insulin injections secondary to insufficient funds (normal insulin dose is 20 units of 70/30 injected 2x daily but pt will often intentionally miss doses or give himself a lower dose to "make the bottle last longer").  The pt also  reports irregular checking of his blood sugars again secondary to insufficient funds and attempting to make testing supplies last longer.      Review of Systems:   Review of Systems  Constitutional: Positive for chills and diaphoresis. Negative for fever.  HENT: Positive for sore throat (pt feels this is 2/2 vomiting). Negative for congestion.   Eyes: Positive for blurred vision.  Respiratory: Negative for shortness of breath.   Cardiovascular: Negative for chest pain.  Gastrointestinal: Positive for nausea, vomiting, abdominal pain and constipation. Negative for blood in stool.  Genitourinary: Negative for dysuria, frequency and hematuria.  Neurological: Positive for tingling (Says his feet will tingle and become restless telling him his sugar is getting too high) and sensory change (pt reports decreased sensation in LE's bilaterally to knees). Negative for focal weakness and headaches.  Psychiatric/Behavioral: Positive for depression and substance abuse.      Medical History: Past Medical History  Diagnosis Date  . Onychomycosis   . Hypertension   . Diabetic peripheral neuropathy   . Diabetic foot ulcer 12/09    left plantar area  . History of drug abuse     Cocaine and marijuana  . Exposure to trichomonas     treated empirically  . Cellulitis of left foot 03/2008    left 4th and 5th metatarsal area  . Hyperlipidemia   . 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 Ulcerative esophagitis 2007    severe, complicated with UGI bleed  . Mallory Janann August tear April 2009  . History  of prolonged Q-T interval on ECG   . 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  . 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 Chronic renal insufficiency   . Depression   . Peripheral vascular disease   . Type I diabetes mellitus   . GERD (gastroesophageal reflux disease)     Surgical History: Past Surgical History  Procedure Date    Toe amputation      Right, 1st toe 05/2010   Toe amputation      Left, 1st toe 05/2010   I&D extremity      Left foot 06/2010  . Toe amputation  06/2011    left; 3rd toe;  Marland Kitchen Nephrectomy 2011    L side  . I&d extremity 08/24/2011    Procedure: IRRIGATION AND DEBRIDEMENT EXTREMITY;  Surgeon: Cammy Copa, MD;  Location: [facility] OR;  Service: Orthopedics;  Laterality: Left;  . Amputation 08/24/2011    Procedure: AMPUTATION RAY;  Surgeon: Cammy Copa, MD;  Location: [facility] OR;  Service: Orthopedics;  Laterality: Left;  left fourth toe Ray Resection  . Amputation 12/05/2011    Procedure: AMPUTATION RAY;  Surgeon: Cammy Copa, MD;  Location: [facility] OR;  Service: Orthopedics;  Laterality: Left; 2nd toe    Home Medications: No current outpatient prescriptions on file.  Allergies: Allergies as of 02/18/2012  . (No Known Allergies)    Family History: Family History  Problem Relation Age of Onset  . Diabetes type II Mother   . Pancreatic cancer Father     Social History: History   Social History  . Marital Status: Married    Spouse Name: N/A    Number of Children: N/A  . Years of Education: N/A   Occupational History  . Currently unemployed; used to work for a car wash; Has applied for disability with a hearing in November   Social History Main Topics  . Smoking status: Current Some Day Smoker -- 0.5 packs/day for 34 years    Types: Cigarettes  . Smokeless tobacco: Never Used     Has tried nicotine patches in the past to quit  . Alcohol Use: Yes     Occasional use maybe monthly, limited secondary to insufficient funds  . Drug Use: Yes    Special: Marijuana     Weekly use when they can afford it or someone else provides it  . Sexually Active: Yes    Physical exam: VITALS: Temp 98 (36.7), Pulse 87, BP 145/80, Resp 17, SpO2 100 on 2L by nasal canula GENERAL: [place] man appears stated age resting comfortably in bed in no apparent distress HEAD: NCAT EYES:  Injected bilaterally but do not appear icteric, pupils equally round and reactive to light, conjunctivae are pink ENT: moist mucosa, throat is clean and non-erythematous, multiple caries appreciated NECK: supple, no thyromegaly, no lymphadenopathy appreciated, no JVD LUNGS: CTA bilaterally, no wheezes, crackles, or rales HEART: Normal rate, regular rhythm, normal S1 and S2, no S3 or S4 appreciated, no murmurs, rubs, no heaves on palpation  ABDOMEN: + normal bowel sounds, soft, non-distended, non-tender, no masses palpated EXTREMITIES: warm, 2+ pulses (radial and dorsalis pedis) bilaterally, LE's show bilateral necrotic skin changes on shins, right foot has 1st toe amputated, minimal fluid draining from callous on bottom or right foot near first meta-tarsal, left leg has eschar present on anterior surface above level of ankle, left foot has only fifth toe which appears  erythematous and edematous, overall skin on both feet appear dry and scaly.  Areas of amputation have skin break down but are not draining. NEURO: EOMI, decreased sensation to light touch on lower extremities bilaterally to knees, pt moved all extremities spontaneously Psych: Mood seemed appropriate for interaction, pt became tearful during interview while describing current situation concerning access to medications    Lab results: Results for orders placed during the hospital encounter of 02/18/12 (from the past 72 hour(s))  GLUCOSE, CAPILLARY     Status: Abnormal   Collection Time   02/18/12  2:14 AM      Component Value Range Comment   Glucose-Capillary 359 (*) 70 - 99 mg/dL   CBC     Status: Abnormal   Collection Time   02/18/12  3:06 AM      Component Value Range Comment   WBC 11.5 (*) 4.0 - 10.5 K/uL    RBC 4.23  4.22 - 5.81 MIL/uL    Hemoglobin 12.5 (*) 13.0 - 17.0 g/dL    HCT 30.8 (*) 65.7 - 52.0 %    MCV 88.2  78.0 - 100.0 fL    MCH 29.6  26.0 - 34.0 pg    MCHC 33.5  30.0 - 36.0 g/dL    RDW 84.6  96.2 - 95.2 %     Platelets 320  150 - 400 K/uL   COMPREHENSIVE METABOLIC PANEL     Status: Abnormal   Collection Time   02/18/12  3:06 AM      Component Value Range Comment   Sodium 138  135 - 145 mEq/L    Potassium 3.7  3.5 - 5.1 mEq/L    Chloride 94 (*) 96 - 112 mEq/L    CO2 26  19 - 32 mEq/L    Glucose, Bld 356 (*) 70 - 99 mg/dL    BUN 19  6 - 23 mg/dL    Creatinine, Ser 8.41  0.50 - 1.35 mg/dL    Calcium 9.8  8.4 - 32.4 mg/dL    Total Protein 7.9  6.0 - 8.3 g/dL    Albumin 3.7  3.5 - 5.2 g/dL    AST 15  0 - 37 U/L    ALT 15  0 - 53 U/L    Alkaline Phosphatase 121 (*) 39 - 117 U/L    Total Bilirubin 0.4  0.3 - 1.2 mg/dL    GFR calc non Af Amer 66 (*) >90 mL/min    GFR calc Af Amer 77 (*) >90 mL/min   LIPASE, BLOOD     Status: Abnormal   Collection Time   02/18/12  3:06 AM      Component Value Range Comment   Lipase 78 (*) 11 - 59 U/L   KETONES, QUALITATIVE     Status: Abnormal   Collection Time   02/18/12  3:06 AM      Component Value Range Comment   Acetone, Bld SMALL (*) NEGATIVE   AMYLASE     Status: Abnormal   Collection Time   02/18/12  3:06 AM      Component Value Range Comment   Amylase 124 (*) 0 - 105 U/L   LACTIC ACID, PLASMA     Status: Normal   Collection Time   02/18/12  3:36 AM      Component Value Range Comment   Lactic Acid, Venous 1.3  0.5 - 2.2 mmol/L   URINALYSIS, ROUTINE W REFLEX MICROSCOPIC     Status: Abnormal  Collection Time   02/18/12  4:09 AM      Component Value Range Comment   Color, Urine YELLOW  YELLOW    APPearance CLEAR  CLEAR    Specific Gravity, Urine 1.035 (*) 1.005 - 1.030    pH 6.5  5.0 - 8.0    Glucose, UA >1000 (*) NEGATIVE mg/dL    Hgb urine dipstick SMALL (*) NEGATIVE    Bilirubin Urine NEGATIVE  NEGATIVE    Ketones, ur 40 (*) NEGATIVE mg/dL    Protein, ur 30 (*) NEGATIVE mg/dL    Urobilinogen, UA 1.0  0.0 - 1.0 mg/dL    Nitrite NEGATIVE  NEGATIVE    Leukocytes, UA NEGATIVE  NEGATIVE   URINE RAPID DRUG SCREEN (HOSP PERFORMED)      Status: Abnormal   Collection Time   02/18/12  4:09 AM      Component Value Range Comment   Opiates POSITIVE (*) NONE DETECTED    Cocaine NONE DETECTED  NONE DETECTED    Benzodiazepines NONE DETECTED  NONE DETECTED    Amphetamines NONE DETECTED  NONE DETECTED    Tetrahydrocannabinol POSITIVE (*) NONE DETECTED    Barbiturates NONE DETECTED  NONE DETECTED   URINE MICROSCOPIC-ADD ON     Status: Normal   Collection Time   02/18/12  4:09 AM      Component Value Range Comment   Squamous Epithelial / LPF RARE  RARE    WBC, UA 0-2  <3 WBC/hpf    RBC / HPF 3-6  <3 RBC/hpf    Bacteria, UA RARE  RARE   TROPONIN I     Status: Normal   Collection Time   02/18/12  4:23 AM      Component Value Range Comment   Troponin I <0.30  <0.30 ng/mL   GLUCOSE, CAPILLARY     Status: Abnormal   Collection Time   02/18/12  5:03 AM      Component Value Range Comment   Glucose-Capillary 335 (*) 70 - 99 mg/dL   GLUCOSE, CAPILLARY     Status: Abnormal   Collection Time   02/18/12  6:05 AM      Component Value Range Comment   Glucose-Capillary 334 (*) 70 - 99 mg/dL   GLUCOSE, CAPILLARY     Status: Abnormal   Collection Time   02/18/12  7:02 AM      Component Value Range Comment   Glucose-Capillary 329 (*) 70 - 99 mg/dL   GLUCOSE, CAPILLARY     Status: Abnormal   Collection Time   02/18/12  8:04 AM      Component Value Range Comment   Glucose-Capillary 267 (*) 70 - 99 mg/dL   POCT I-STAT 3, BLOOD GAS (G3+)     Status: Abnormal   Collection Time   02/18/12  9:06 AM      Component Value Range Comment   pH, Arterial 7.430  7.350 - 7.450    pCO2 arterial 39.5  35.0 - 45.0 mmHg    pO2, Arterial 96.0  80.0 - 100.0 mmHg    Bicarbonate 26.3 (*) 20.0 - 24.0 mEq/L    TCO2 27  0 - 100 mmol/L    O2 Saturation 98.0      Acid-Base Excess 2.0  0.0 - 2.0 mmol/L    Collection site RADIAL, ALLEN'S TEST ACCEPTABLE      Drawn by Operator      Sample type ARTERIAL     GLUCOSE, CAPILLARY     Status:  Abnormal    Collection Time   02/18/12  9:09 AM      Component Value Range Comment   Glucose-Capillary 193 (*) 70 - 99 mg/dL   GLUCOSE, CAPILLARY     Status: Abnormal   Collection Time   02/18/12 12:05 PM      Component Value Range Comment   Glucose-Capillary 184 (*) 70 - 99 mg/dL    Imaging results: Mr Foot Left W Wo Contrast  02/18/2012  *RADIOLOGY REPORT*  Clinical Data: Multiple toe amputations, most recently August 2013. Erythema of the remaining fifth toe.  Diabetes.  Query osteomyelitis.  MRI OF THE LEFT FOREFOOT WITHOUT AND WITH CONTRAST  Technique:  Multiplanar, multisequence MR imaging was performed both before and after administration of intravenous contrast.  Contrast: 20mL MULTIHANCE GADOBENATE DIMEGLUMINE 529 MG/ML IV SOLN  Comparison: Multiple exams, including 02/18/2012 and 12/04/2011  Findings: Prior amputation of the first through fourth digits at the level of the metatarsals noted.  Osseous edema in the shaft and head of the fifth metatarsal as well as in the fifth digit phalanges noted with associated enhancement compatible with osteomyelitis.  There is also abnormal edema and enhancement compatible with chronic osteomyelitis in the shaft of the second, third, and fourth metatarsals with associated chronic periostitis especially in the second and third metatarsals.  There is chronic periostitis with irregularity of the distal margin and faintly increased edema and enhancement in the distal remaining shaft of the first metatarsal, favoring chronic osteomyelitis.  High signal laterally in the base of the first metatarsal is probably degenerative but could conceivably relate to osteomyelitis.  Lisfranc ligament is still intact.  There are degenerative findings at the Lisfranc joint.  No compelling findings of midfoot osteomyelitis in the visualized portion of the midfoot.  Regarding the soft tissues, there is phlegmon surrounding the distal portions of the first through fourth metatarsals along  with apparent skin blistering or skin thickening along the prior amputation bed region distally, as well as diffuse infiltrative edema and enhancement in the soft tissues of the distal foot compatible with cellulitis and possibly fasciitis.  No well-defined drainable abscess is observed.  Cellulitis and enhancement extend diffusely in the soft tissues of the fifth toe.  IMPRESSION:  1.  Osteomyelitis of the fifth metatarsal and fifth toe phalanges, with surrounding cellulitis. 2.  Chronic osteomyelitis involving the first through fourth metatarsals. 3.  Diffuse cellulitis with inflammatory phlegmon most concentrated around the heads of the first through fourth metatarsals, potentially with the distal blistering/skin thickening.  A well- defined drainable soft tissue abscess is not observed.   Original Report Authenticated By: Gaylyn Rong, M.D.    Dg Foot Complete Left  02/18/2012  *RADIOLOGY REPORT*  Clinical Data: Swelling and blistering of left fifth toe. History of multiple toe amputations and diabetes.  LEFT FOOT - COMPLETE 3+ VIEW  Comparison: Left second toe radiographs and left foot MRI performed 12/04/2011  Findings: There is no definite evidence of osseous erosion at the fifth toe to suggest osteomyelitis.  The left fifth toe appears grossly intact.  There is amputation across the first through fourth metatarsals.  Visualized joint spaces are grossly preserved.  Soft tissue swelling is noted at the sites of amputation.  IMPRESSION: No definite evidence of osseous erosion at the fifth toe to suggest osteomyelitis; the fifth toe appears grossly intact.   Original Report Authenticated By: Tonia Ghent, M.D.     Other results: EKG Results:  02/18/2012 Rate:  71 Rhythm: Sinus Axis: Left  axis deviation PR:  148 ms QRS:  102 ms QTc:  469 ms EKG: unchanged from previous tracings  Assessment and Plan: Mr. Montay is a 46 yo man with a history significant for poorly controlled TIDM with  multiple complications as a result and substance abuse that presents with 6-7 episodes of emesis being treated for diabetic ketoacidosis.  1. DKA - Pt with history of TIDM presented with abdominal pain and vomiting, history of inconsistent insulin therapy, a blood sugar of 359, and urinalysis showed ketones 40 mg/dL.  Potential causes of DKA that are likely in this patient include insufficient insulin, inflammation (specifically pancreatitis), infection, and ischemia.  Insufficient insulin is the most likely diagnosis given the patient's admittance to missing insulin doses and even taking lower than necessary doses in order to make his medication last longer.  While insulin deficiency is likely the main contributor to this pt's DKA infection is likely also contributing (pt had mild leukocytosis of 11.5 and MRI of foot demonstrated osteomyelitis).  Pancreatitis could be a cause of the pt's DKA however lipase is only slightly increased to 78 and patient denies recent alcohol use.  Ischemia is also a potential precipitant of DKA, unlikely in this pt given no changes in ECG and negative cardiac enzymes. - Blood cultures - Last CBG 184 - IV fluids D5 NS w/ 20 mEq K @ 150 mL/hr - IV Insulin 0.02 U/kg with goal glucose 150-200 - Prophylactic K replacement via iv fluids   2. Increased AG metabolic acidosis and metabolic alkalosis -  ABG showed pH 7.43, pCO2 39.5, pO2 96, and bicarb 26.3 (high). Serum AG  =  Na - (Cl  +  HCO3) = 138 - (94+26) = 18.  Delta-delta gap -> (calculated AG - expected AG)/(24-measured bicarb) -> (18-12)/(24-26) = absolute value of 3.  This indicates that the patient has a simultaneous metabolic acidosis and metabolic alkalosis.    Pt could have this situation secondary to DKA and a contraction alkalosis from multiple emeses (Increased acid secondary to ketones and decreased H+ ions from vomiting).  We also have to consider the timing of these tests, pt's metabolic panel was drawn at 0300  11/10 while ABG was drawn at 0900 all the while the pt was being treated aggressively for DKA with iv insulin, his ketoacidosis could have been resolved by the time the ABG was taken therefore the pH normalized. - CBG q2 hrs  - BMETs q2 hours until anion gap closes  - DKA protocol see problem 1  3. Osteomyelitis - Marked skin breakdown on left foot at areas of amputation, physical exam show left foot fifth digit with erythema and edema.  MRI without contrast of left foot was read as acute osteomyelitis of the fifth digit. - Blood cultures - Ortho consulted for potential amputation - IV vancomycin and piperacillin/tazobactam  4. Likely Type II Diabetes Mellitus - there are inconsistent listings in the records and pt is unclear on what type of diabetes he has (reports not being diabetic until his 30's).  Pt admits to being unable to afford his medications and inconsistently checking his blood sugars and dosing insulin - Control sugars with IV insulin  - Plan to switch to subcutaneous once pt is eating and DKA has resolved  5. Diet - NPO in anticipation of surgery - Once DKA resolves (AG closes) modified carbohydrate diet  6. Dispo - Will admit to inpatient service for treatment - Plan to dc home once DKA has resolved (anion gap  closed) and pt is stable from amputation    Signed by:  Burnard Leigh MS-III  02/18/2012, 2:37 PM

## 2012-02-19 ENCOUNTER — Encounter (HOSPITAL_COMMUNITY): Payer: Self-pay | Admitting: *Deleted

## 2012-02-19 DIAGNOSIS — M869 Osteomyelitis, unspecified: Secondary | ICD-10-CM | POA: Diagnosis present

## 2012-02-19 HISTORY — DX: Osteomyelitis, unspecified: M86.9

## 2012-02-19 LAB — GLUCOSE, CAPILLARY
Glucose-Capillary: 55 mg/dL — ABNORMAL LOW (ref 70–99)
Glucose-Capillary: 83 mg/dL (ref 70–99)
Glucose-Capillary: 231 mg/dL — ABNORMAL HIGH (ref 70–99)
Glucose-Capillary: 302 mg/dL — ABNORMAL HIGH (ref 70–99)
Glucose-Capillary: 257 mg/dL — ABNORMAL HIGH (ref 70–99)

## 2012-02-19 LAB — BASIC METABOLIC PANEL
CO2: 25 mEq/L (ref 19–32)
Chloride: 102 mEq/L (ref 96–112)
Creatinine, Ser: 1.31 mg/dL (ref 0.50–1.35)
Glucose, Bld: 229 mg/dL — ABNORMAL HIGH (ref 70–99)

## 2012-02-19 MED ORDER — FLUOXETINE HCL 10 MG PO CAPS
10.0000 mg | ORAL_CAPSULE | Freq: Every day | ORAL | Status: DC
  Administered 2012-02-19 – 2012-02-20 (×2): 10 mg via ORAL
  Filled 2012-02-19 (×2): qty 1

## 2012-02-19 MED ORDER — DIPHENHYDRAMINE HCL 25 MG PO CAPS
25.0000 mg | ORAL_CAPSULE | Freq: Every evening | ORAL | Status: DC | PRN
  Administered 2012-02-19: 25 mg via ORAL
  Filled 2012-02-19: qty 1

## 2012-02-19 MED ORDER — INSULIN ASPART PROT & ASPART (70-30 MIX) 100 UNIT/ML ~~LOC~~ SUSP
32.0000 [IU] | Freq: Every day | SUBCUTANEOUS | Status: DC
  Administered 2012-02-20: 32 [IU] via SUBCUTANEOUS
  Filled 2012-02-19: qty 3

## 2012-02-19 MED ORDER — INSULIN ASPART PROT & ASPART (70-30 MIX) 100 UNIT/ML ~~LOC~~ SUSP
28.0000 [IU] | Freq: Every day | SUBCUTANEOUS | Status: DC
  Administered 2012-02-19: 28 [IU] via SUBCUTANEOUS

## 2012-02-19 MED ORDER — NICOTINE 14 MG/24HR TD PT24
14.0000 mg | MEDICATED_PATCH | Freq: Every day | TRANSDERMAL | Status: DC
Start: 2012-02-19 — End: 2012-02-20
  Administered 2012-02-19 – 2012-02-20 (×2): 14 mg via TRANSDERMAL
  Filled 2012-02-19 (×2): qty 1

## 2012-02-19 MED ORDER — CARVEDILOL 6.25 MG PO TABS
6.2500 mg | ORAL_TABLET | Freq: Two times a day (BID) | ORAL | Status: DC
  Administered 2012-02-19 – 2012-02-20 (×2): 6.25 mg via ORAL
  Filled 2012-02-19 (×4): qty 1

## 2012-02-19 MED ORDER — INSULIN ASPART 100 UNIT/ML ~~LOC~~ SOLN: 0.0000 [IU] | Freq: Three times a day (TID) | SUBCUTANEOUS | Status: DC

## 2012-02-19 MED ORDER — INSULIN ASPART 100 UNIT/ML ~~LOC~~ SOLN
0.0000 [IU] | Freq: Three times a day (TID) | SUBCUTANEOUS | Status: DC
  Administered 2012-02-19: 2 [IU] via SUBCUTANEOUS
  Administered 2012-02-19: 3 [IU] via SUBCUTANEOUS

## 2012-02-19 MED ORDER — INFLUENZA VIRUS VACC SPLIT PF IM SUSP
0.5000 mL | INTRAMUSCULAR | Status: AC
  Administered 2012-02-19: 0.5 mL via INTRAMUSCULAR
  Filled 2012-02-19: qty 0.5

## 2012-02-19 MED ORDER — INSULIN ASPART 100 UNIT/ML ~~LOC~~ SOLN
0.0000 [IU] | Freq: Three times a day (TID) | SUBCUTANEOUS | Status: DC
  Administered 2012-02-19: 8 [IU] via SUBCUTANEOUS
  Administered 2012-02-19: 11 [IU] via SUBCUTANEOUS
  Administered 2012-02-20: 5 [IU] via SUBCUTANEOUS
  Administered 2012-02-20: 2 [IU] via SUBCUTANEOUS

## 2012-02-19 MED ORDER — ENOXAPARIN SODIUM 40 MG/0.4ML ~~LOC~~ SOLN
40.0000 mg | SUBCUTANEOUS | Status: DC
  Administered 2012-02-19: 40 mg via SUBCUTANEOUS
  Filled 2012-02-19 (×2): qty 0.4

## 2012-02-19 MED ORDER — POTASSIUM CHLORIDE CRYS ER 20 MEQ PO TBCR
40.0000 meq | EXTENDED_RELEASE_TABLET | Freq: Once | ORAL | Status: AC
  Administered 2012-02-19: 40 meq via ORAL
  Filled 2012-02-19: qty 2

## 2012-02-19 MED ORDER — SODIUM CHLORIDE 0.9 % IV SOLN
1000.0000 mL | INTRAVENOUS | Status: DC
  Administered 2012-02-19 (×2): 1000 mL via INTRAVENOUS

## 2012-02-19 MED ORDER — ENOXAPARIN SODIUM 40 MG/0.4ML ~~LOC~~ SOLN: 40.0000 mg | SUBCUTANEOUS | Status: DC

## 2012-02-19 MED ORDER — HYDROCHLOROTHIAZIDE 25 MG PO TABS
25.0000 mg | ORAL_TABLET | Freq: Every day | ORAL | Status: DC
  Administered 2012-02-19 – 2012-02-20 (×2): 25 mg via ORAL
  Filled 2012-02-19 (×3): qty 1

## 2012-02-19 NOTE — Progress Notes (Signed)
Resident Addendum to Medical Student Note   I have seen and examined the patient, and agree with the the medical student assessment and plan outlined above. Please see my brief note below for additional details.  S: Pt to OR last night for amputation of L 5th digit, revision of transmetatarsal amputation, and irrigation/debridement of foot. Tolerated well. AG closed at 5pm yesterday. Continued on IV insulin drip overnight in light of OR procedure, but episode of hypoglycemia (55), so drip was d/c'd. Started SSI this am.  Patient doing well, says pain well controlled. Nausea and vomiting and abdominal pain have all resolved. Denies CP, SOB, dizziness, diarrhea.  OBJECTIVE: VS: Reviewed  Meds: Reviewed  Labs: Reviewed  Imaging: Reviewed   Physical Exam: Vitals reviewed. General: resting in bed, NAD HEENT: PERRL, EOMI, MMM of OP Cardiac: RRR, no rubs, murmurs or gallops Pulm: clear to auscultation bilaterally, no wheezes, rales, or rhonchi Abd: soft, nontender, nondistended, BS present Ext: L foot in pressure dressing recently changed by surgery. 2+ PT pulses bilaterally, no edema Neuro: alert and oriented X3, cranial nerves II-XII grossly intact, strength intact bilateral upper and lower extremities  ASSESSMENT/ PLAN: Pt is a 46 y.o. yo male with a PMHx of HTN, PVD, chronic renal insufficiency, and DM who was admitted on 02/18/2012 in DKA in the setting of insulin noncompliance and osteomyelitis of the L 5th toe.   1) Diabetic Ketoacidosis - AG is closed. Transitioned pt to 70/30 novolog which he was taking previously (32 in am, 28 nightme). Continue SSI coverage as needed. Replace K prn. Case mgmt for med assistance.   2) Diabetic foot ulcer/ 5th L toe osteomyelitis/cellulitis  Tolerated procedure well. On vanc/zosyn. Ortho recommends transition to doxy tomorrow and d/c. Will have PT eval for any rehab needs.    3) Hypertension  Restarted pt's Coreg and HCTZ, holding ACEi for now.     4) Chronic renal insufficiency  Secondary to diabetic nephropathy, patient also s/p nephrectomy of L kidney in 2010 2/2 chronic pyelonephritis and pyonephrosis. Cr is at baseline, no evidence of acute on chronic disease   5) Depression  Patient on Prozac at home. Will restart today now that tolerating po   6) Dispo  - Social work has been contacted for potential bus pass given pt's transportation limitations  - Case management has been contacted for help with outpt PT/OT, home health wound care, and medication assistance  - Plan to dc to home once blood sugars are better controlled and Ortho has signed off on his surgery.   Length of Stay: 1  Bronson Curb 02/19/2012 1:22 PM

## 2012-02-19 NOTE — Progress Notes (Signed)
Patient admitted with hyperglycemia and osteomyelitis.  Underwent surgery 02/18/12.  A1c 13.1% (02/18/12).  Spoke to patient about his A1c results.  Explained what an A1c and what it measures.  Patient stated he knew his A1c was going to be up b/c he has to use his insulin sparingly.  Cannot afford insulin at this time.  Patient states he has been getting free samples of the pre-mixed insulin in the Internal medicine clinic.  Is hoping to have his disability started by the end of the year.  Hopes also to get Medicaid approval as well.  Reminded patient about the importance of good CBG control to prevent further amputations and to protect his eyes, heart, and kidneys.  Patient stated he knows what to do, he just doesn't have money to afford testing supplies and insulin.   Noted care management following patient.    Noted 70/30 insulin restarted today.  Will follow closely. Ambrose Finland RN, MSN, CDE Diabetes Coordinator Inpatient Diabetes Program [phone]

## 2012-02-19 NOTE — H&P (Signed)
Internal Medicine Attending Admission Note Date: 02/19/2012  Patient name: [name] Medical record number: [id number] Date of birth: September 24, 1965 Age: 46 y.o. Gender: male  I saw and evaluated the patient. I reviewed the resident's note and I agree with the resident's findings and plan as documented in the resident's note, with additional comments as noted below.   Chief Complaint(s): Nausea, vomiting, abdominal pain  History - key components related to admission: Patient is a 45 year old man with history of diabetes mellitus,  multiple past toe amputations,hypertension, and other problems as outlined in the medical history, admitted with nausea, vomiting, abdominal pain, and foot infection.  He was taken to the OR by Dr. Magnus Ivan last night and underwent irrigation and debridement of the left foot with revision of a transmetatarsal amputation.  Physical Exam - key components related to admission:  Filed Vitals:   02/19/12 0154 02/19/12 0636 02/19/12 1008 02/19/12 1332  BP: 134/93 127/74 152/100 141/80  Pulse: 91 88 98 95  Temp: 98.3 F (36.8 C) 98.7 F (37.1 C) 98.4 F (36.9 C) 98.1 F (36.7 C)  TempSrc:      Resp: 18 18 20 18   Height:      Weight:      SpO2: 100% 100% 100% 100%   General: Alert, no distress Lungs: Clear Heart: Regular; no extra sounds or murmurs Abdomen: Bowel sounds present, soft, nontender Extremities: No edema; postsurgical dressing in place on left foot  Lab results:   Basic Metabolic Panel:  [facility] 02/18/12 2321 02/18/12 1733  NA 137 138  K 3.7 3.8  CL 102 101  CO2 25 25  GLUCOSE 229* 204*  BUN 12 13  CREATININE 1.31 1.35  CALCIUM 8.2* 8.6  MG -- --  PHOS -- --   Liver Function Tests:  [facility] 02/18/12 0306  AST 15  ALT 15  ALKPHOS 121*  BILITOT 0.4  PROT 7.9  ALBUMIN 3.7    Basename 02/18/12 0306  LIPASE 78*  AMYLASE 124*    CBC:  Basename 02/18/12 1431 02/18/12 0306  WBC 15.0* 11.5*  NEUTROABS -- --  HGB 12.3*  12.5*  HCT 36.8* 37.3*  MCV 88.0 88.2  PLT 319 320   Cardiac Enzymes:  Basename 02/18/12 0423  CKTOTAL --  CKMB --  CKMBINDEX --  TROPONINI <0.30    CBG:  Basename 02/19/12 1230 02/19/12 0749 02/19/12 0441 02/19/12 0350 02/19/12 0316 02/19/12 0233  GLUCAP 302* 231* 157* 66* 55* 83   Hemoglobin A1C:  Basename 02/18/12 1431  HGBA1C 13.1*    Imaging results:  Mr Foot Left W Wo Contrast  02/18/2012  *RADIOLOGY REPORT*  Clinical Data: Multiple toe amputations, most recently August 2013. Erythema of the remaining fifth toe.  Diabetes.  Query osteomyelitis.  MRI OF THE LEFT FOREFOOT WITHOUT AND WITH CONTRAST  Technique:  Multiplanar, multisequence MR imaging was performed both before and after administration of intravenous contrast.  Contrast: 20mL MULTIHANCE GADOBENATE DIMEGLUMINE 529 MG/ML IV SOLN  Comparison: Multiple exams, including 02/18/2012 and 12/04/2011  Findings: Prior amputation of the first through fourth digits at the level of the metatarsals noted.  Osseous edema in the shaft and head of the fifth metatarsal as well as in the fifth digit phalanges noted with associated enhancement compatible with osteomyelitis.  There is also abnormal edema and enhancement compatible with chronic osteomyelitis in the shaft of the second, third, and fourth metatarsals with associated chronic periostitis especially in the second and third metatarsals.  There is chronic periostitis with irregularity  of the distal margin and faintly increased edema and enhancement in the distal remaining shaft of the first metatarsal, favoring chronic osteomyelitis.  High signal laterally in the base of the first metatarsal is probably degenerative but could conceivably relate to osteomyelitis.  Lisfranc ligament is still intact.  There are degenerative findings at the Lisfranc joint.  No compelling findings of midfoot osteomyelitis in the visualized portion of the midfoot.  Regarding the soft tissues, there is  phlegmon surrounding the distal portions of the first through fourth metatarsals along with apparent skin blistering or skin thickening along the prior amputation bed region distally, as well as diffuse infiltrative edema and enhancement in the soft tissues of the distal foot compatible with cellulitis and possibly fasciitis.  No well-defined drainable abscess is observed.  Cellulitis and enhancement extend diffusely in the soft tissues of the fifth toe.  IMPRESSION:  1.  Osteomyelitis of the fifth metatarsal and fifth toe phalanges, with surrounding cellulitis. 2.  Chronic osteomyelitis involving the first through fourth metatarsals. 3.  Diffuse cellulitis with inflammatory phlegmon most concentrated around the heads of the first through fourth metatarsals, potentially with the distal blistering/skin thickening.  A well- defined drainable soft tissue abscess is not observed.   Original Report Authenticated By: Gaylyn Rong, M.D.    Dg Foot Complete Left  02/18/2012  *RADIOLOGY REPORT*  Clinical Data: Swelling and blistering of left fifth toe. History of multiple toe amputations and diabetes.  LEFT FOOT - COMPLETE 3+ VIEW  Comparison: Left second toe radiographs and left foot MRI performed 12/04/2011  Findings: There is no definite evidence of osseous erosion at the fifth toe to suggest osteomyelitis.  The left fifth toe appears grossly intact.  There is amputation across the first through fourth metatarsals.  Visualized joint spaces are grossly preserved.  Soft tissue swelling is noted at the sites of amputation.  IMPRESSION: No definite evidence of osseous erosion at the fifth toe to suggest osteomyelitis; the fifth toe appears grossly intact.   Original Report Authenticated By: Tonia Ghent, M.D.     Other results: EKG: Sinus rhythm, early precordial R/S transition, otherwise normal  Assessment & Plan by Problem:  1.  Diabetic foot infection with osteomyelitis of the left foot.  Patient  underwent surgery last night by Dr. Magnus Ivan and is doing well postoperatively.   Plan is continue empiric antibiotics; postoperative care as per Dr. Magnus Ivan; consult OT/PT.  2.  Diabetic ketoacidosis.  Patient's initial electrolytes and arterial blood gas were consistent with a mixed acid-base disorder, specifically a high anion gap metabolic acidosis due to DKA, and a metabolic alkalosis due to vomiting and volume contraction.  This has corrected with IV normal saline volume replacement and insulin.  Patient is now on a subcutaneous insulin regimen, and the plan is to follow blood sugars and adjust his regimen as indicated.  Diabetic education is a priority.  3.  Hypertension.  Plan is to add back patient's antihypertensive medications and follow his blood pressure.  4.  Social issues.  Transportation appears to be a significant barrier, and also the question of how his wound will be managed postoperatively.  Plan is social work and case management consults.

## 2012-02-19 NOTE — Op Note (Signed)
NAMEDAWAYNE, OHAIR NO.:  [id number]  MEDICAL RECORD NO.:  [id number]  LOCATION:  6N24C                        FACILITY:  [facility]  PHYSICIAN:  Vanita Panda. Magnus Ivan, M.D.DATE OF BIRTH:  1965-05-22  DATE OF PROCEDURE:  02/18/2012 DATE OF DISCHARGE:                              OPERATIVE REPORT   PREOPERATIVE DIAGNOSIS:  Left foot infection with acute osteomyelitis involving fifth toe and residual inflammation and infection of left forefoot.  POSTOPERATIVE DIAGNOSIS:  Left foot infection with acute osteomyelitis involving fifth toe and residual inflammation and infection of left forefoot.  PROCEDURE: 1. Amputation of left 5th toe. 2. Revision transmetatarsal amputation, left foot. 3. Irrigation and debridement of soft tissues muscle and bone, left     foot.  SURGEON:  Vanita Panda. Magnus Ivan, M.D.  ANESTHESIA:  General.  TOURNIQUET TIME:  Less than 1 hour.  BLOOD LOSS:  Less than 100 mL.  COMPLICATIONS:  None.  INDICATIONS:  [name] is a 46 year old gentleman with peripheral vascular disease and poorly controlled diabetes.  He has had a previous left foot 1st, 2nd, 3rd, and 4th ray amputation at different times.  He now presents with his only toe infected at this standpoint.  His blood glucose has been poorly controlled.  He has an MRI that shows osteomyelitis in the 5th toe, and also questionable osteomyelitis in the remaining aspects of the metatarsals from this previous amputations.  I recommended he undergo a 5th toe amputation and revision transmetatarsal amputation for closure purposes.  The risks and benefits of this were explained to him in detail and he did wish to proceed with surgery.  PROCEDURE DESCRIPTION:  After informed consent was obtained, appropriate left foot was marked.  He was brought to the operating room, placed supine on the operating table.  General anesthesia was then obtained. The calf tourniquet was placed around his  left calf and his left foot and ankle were prepped with DuraPrep and sterile drapes.  Time-out was called and he was identified as the correct patient and correct left foot.  I then used an Esmarch to wrap out the foot and tourniquet inflated 300 mmHg.  I then 1st removed the 5th toe in its entirety sharply with knife and passed this off.  I then used a #10 blade, and excised the callus tissue all from the forefoot from the previous ray amputations.  I did not encounter any gross purulence in this area.  I used a rongeur to backup all the first through fifth metatarsals and cleaned soft tissue necrotic debris.  I then fully and copiously irrigated the soft tissue with normal saline solution, I let the tourniquet down, and there was no bleeding.  I then fashioned a flap from plantar to dorsal closing with interrupted 2-0 nylon suture.  Xeroform followed by a sterile dressing was applied.  The patient was awakened, extubated, and taken to the recovery room in a stable condition.  All final counts were correct.  There were no complications noted.     Vanita Panda. Magnus Ivan, M.D.     CYB/MEDQ  D:  02/18/2012  T:  02/19/2012  Job:  [id number]

## 2012-02-19 NOTE — Progress Notes (Signed)
Orthopedic Tech Progress Note Patient Details:  [name] [birth year] [id number]  Ortho Devices Type of Ortho Device: Postop boot Ortho Device/Splint Location: darco shoe Ortho Device/Splint Interventions: Application   Cammer, Mickie Bail 02/19/2012, 11:10 AM

## 2012-02-19 NOTE — Progress Notes (Signed)
Subjective: 1 Day Post-Op Procedure(s) (LRB): IRRIGATION AND DEBRIDEMENT EXTREMITY (Left) AMPUTATION DIGIT (Left) Patient reports pain as moderate.    Objective: Vital signs in last 24 hours: Temp:  [97.3 F (36.3 C)-98.8 F (37.1 C)] 98.7 F (37.1 C) (11/11 0636) Pulse Rate:  [83-103] 88  (11/11 0636) Resp:  [13-24] 18  (11/11 0636) BP: (123-170)/(74-105) 127/74 mmHg (11/11 0636) SpO2:  [99 %-100 %] 100 % (11/11 0636) Weight:  [95.074 kg (209 lb 9.6 oz)] 95.074 kg (209 lb 9.6 oz) (11/10 2123)  Intake/Output from previous day: 11/10 0701 - 11/11 0700 In: 600 [I.V.:600] Out: 1200 [Urine:1200] Intake/Output this shift:     Basename 02/18/12 1431 02/18/12 0306  HGB 12.3* 12.5*    Basename 02/18/12 1431 02/18/12 0306  WBC 15.0* 11.5*  RBC 4.18* 4.23  HCT 36.8* 37.3*  PLT 319 320    Basename 02/18/12 2321 02/18/12 1733  NA 137 138  K 3.7 3.8  CL 102 101  CO2 25 25  BUN 12 13  CREATININE 1.31 1.35  GLUCOSE 229* 204*  CALCIUM 8.2* 8.6   No results found for this basename: LABPT:2,INR:2 in the last 72 hours  Incision: scant drainage  Assessment/Plan: 1 Day Post-Op Procedure(s) (LRB): IRRIGATION AND DEBRIDEMENT EXTREMITY (Left) AMPUTATION DIGIT (Left) Continue IV antibiotics today and then switch to oral doxycycline tomorrow. Discharge to home tomorrow.  [name] 02/19/2012, 8:03 AM

## 2012-02-19 NOTE — Evaluation (Signed)
Physical Therapy Evaluation Patient Details Name: [name] MRN: [id number] DOB: 1965/07/19 Today's Date: 02/19/2012 Time: 4010-2725 PT Time Calculation (min): 16 min  PT Assessment / Plan / Recommendation Clinical Impression  Pt s/p L 5th toe amputation. Pt has had multiple amputations and is compliant with WB status and safety during all mobility. Pt is at his baseline functional level, no acute PT needs, will not follow.    PT Assessment  Patent does not need any further PT services    Follow Up Recommendations  No PT follow up    Does the patient have the potential to tolerate intense rehabilitation      Barriers to Discharge        Equipment Recommendations  None recommended by PT    Recommendations for Other Services     Frequency      Precautions / Restrictions Precautions Precautions: None Restrictions Weight Bearing Restrictions: Yes LLE Weight Bearing:  (WB through heel)   Pertinent Vitals/Pain Pain 4/10 in LLE. Rn aware.       Mobility  Bed Mobility Bed Mobility: Supine to Sit;Sitting - Scoot to Edge of Bed Supine to Sit: 7: Independent Sitting - Scoot to [place] of Bed: 7: Independent Transfers Transfers: Sit to Stand;Stand to Sit Sit to Stand: 6: Modified independent (Device/Increase time) Stand to Sit: 6: Modified independent (Device/Increase time) Details for Transfer Assistance: Slow movement into standing although no phyiscal assist needed Ambulation/Gait Ambulation/Gait Assistance: 5: Supervision Ambulation Distance (Feet): 400 Feet Assistive device: None Ambulation/Gait Assistance Details: Supervision for safety only. Pt with slightly unstable gait although no loss of balance. Pt ambulating with darco boot Gait Pattern: Within Functional Limits Gait velocity: slow gait speed Stairs: No      Visit Information  Last PT Received On: 02/19/12 Assistance Needed: +1 PT/OT Co-Evaluation/Treatment: Yes    Subjective Data  Subjective: I've  been through this already Patient Stated Goal: to go home   Prior Functioning  Home Living Lives With: Spouse Available Help at Discharge: Family;Available 24 hours/day Type of Home: House Home Access: Ramped entrance Home Layout: One level Bathroom Shower/Tub: Tub/shower unit;C[employer]ain Firefighter: Standard Bathroom Accessibility: Yes How Accessible: Accessible via walker Home Adaptive Equipment: Straight cane;Walker - rolling;Crutches (raised toilet seat) Prior Function Level of Independence: Independent Able to Take Stairs?: Yes Driving: No Vocation: Unemployed Communication Communication: No difficulties Dominant Hand: Right    Cognition  Overall Cognitive Status: Appears within functional limits for tasks assessed/performed Arousal/Alertness: Awake/alert Orientation Level: Appears intact for tasks assessed Behavior During Session: [facility] for tasks performed    Extremity/Trunk Assessment Right Lower Extremity Assessment RLE ROM/Strength/Tone: Within functional levels RLE Sensation: WFL - Light Touch Left Lower Extremity Assessment LLE ROM/Strength/Tone: Deficits LLE ROM/Strength/Tone Deficits: Hip and Ankle WFL; unable to assess ankle secondary to pain and bandaging LLE Sensation: WFL - Light Touch   Balance    End of Session PT - End of Session Equipment Utilized During Treatment: Gait belt Activity Tolerance: Patient tolerated treatment well Patient left: in chair;with call bell/phone within reach Nurse Communication: Mobility status  GP     Milana Kidney 02/19/2012, 3:57 PM  02/19/2012 Milana Kidney DPT PAGER: [phone] OFFICE: [phone]

## 2012-02-19 NOTE — Progress Notes (Signed)
Patient ID: [name], male   DOB: Feb 14, 1966, 46 y.o.   MRN: [id number] Medical Student Daily Progress Note   Subjective:    Interval Events:  No acute events overnight.  Pt reports sleeping poorly but contributes this to environment.  Also endorses a HA this morning which he links to his poor sleep.  He says his pain is being fairly well controlled with current analgesia regimen.  He has not yet had a bowel movement but continues to make urine (only issue with urinating is a delayed stream start).  Pt admits today that likely whatever prescriptions we send him home with will not get filled due to lack of funds.  Denies fever, chills, SOB, chest pain, abdominal pain, weakness/numbness in extremities.    Objective:    Vital Signs:   Temp:  [97.3 F (36.3 C)-98.8 F (37.1 C)] 98.4 F (36.9 C) (11/11 1008) Pulse Rate:  [83-100] 98  (11/11 1008) Resp:  [13-22] 20  (11/11 1008) BP: (123-170)/(74-105) 152/100 mmHg (11/11 1008) SpO2:  [100 %] 100 % (11/11 1008) Weight:  [95.074 kg (209 lb 9.6 oz)] 95.074 kg (209 lb 9.6 oz) (11/10 2123)     Weights: 24-hour Weight change:   Filed Weights   02/18/12 2123  Weight: 95.074 kg (209 lb 9.6 oz)   Net since admission:  0  Intake/Output:   Intake/Output Summary (Last 24 hours) at 02/19/12 1235 Last data filed at 02/19/12 0848  Gross per 24 hour  Intake    840 ml  Output   1240 ml  Net   -400 ml     Net since admission:  -400   Physical Exam: GENERAL:  alert and oriented; resting comfortably in bed and in no distress EYES:  pupils equal, round, and reactive to light; sclera anicteric ENT:  moist mucosa, throat is clean and non-erythematous NECK:  no JVD, no thyromegaly, no lymphadenopathy LUNGS:  clear to auscultation bilaterally, normal work of breathing HEART:  normal rate; regular rhythm; normal S1 and S2, no S3 or S4 appreciated; no murmurs, rubs, or clicks ABDOMEN:  soft, non-tender, normal bowel sounds, no masses palpated,  small (approximately 2 cm) vertical scar present at midline directly above umbilicus. EXTREMITIES:  No peripheral edema, 2 + pulses (bilateral radial, right sided dorsalis pedis, left side unable to assess 2/2 bandaging)  NEURO: During sensation testing pt is aware of pressure on the lower extremities but has diminished discrimination between sharp vs light touch and temperature.  This decreased sensation is apparent when compared to upper extremities, but decreased sensation seems to be equal between lower extremities and tracks upward to about mid shin.  Strength 5/5 biceps, triceps, hip flexion/extension, and knee extension/flexion bilaterally.  Palate rises symmetrically, normal eye closure to resistance bilaterally, forehead rises symmetrically, 5/5 shoulder shrug, tongue deviates to midline. SKIN:  normal turgor, multiple areas of hyperpigmentation on shins bilaterally, left lower extremity has irregular eschar approximately 2 in in length on anterior shin.  Right foot has two areas of significant induration 1st is on plantar surface (medial/distal portion), 2nd area is on lateral surface of heel.    Labs: Basic Metabolic Panel:  Lab 02/18/12 4098 02/18/12 1733 02/18/12 1547 02/18/12 1431 02/18/12 0306  NA 137 138 138 139 138  K 3.7 3.8 3.7 3.7 3.7  CL 102 101 101 100 94*  CO2 25 25 24 25 26   GLUCOSE 229* 204* 193* 165* 356*  BUN 12 13 14 15 19   CREATININE 1.31 1.35 1.32  1.31 1.27  CALCIUM 8.2* 8.6 8.7 -- --  MG -- -- -- -- --  PHOS -- -- -- -- --    Liver Function Tests:  Lab 02/18/12 0306  AST 15  ALT 15  ALKPHOS 121*  BILITOT 0.4  PROT 7.9  ALBUMIN 3.7    Lab 02/18/12 0306  LIPASE 78*  AMYLASE 124*   CBC:  Lab 02/18/12 1431 02/18/12 0306  WBC 15.0* 11.5*  NEUTROABS -- --  HGB 12.3* 12.5*  HCT 36.8* 37.3*  MCV 88.0 88.2  PLT 319 320    Cardiac Enzymes:  Lab 02/18/12 0423  CKTOTAL --  CKMB --  CKMBINDEX --  TROPONINI <0.30   CBG:  Lab 02/19/12 1230  02/19/12 0749 02/19/12 0441 02/19/12 0350 02/19/12 0316  GLUCAP 302* 231* 157* 66* 55*    Microbiology: Results for orders placed during the hospital encounter of 12/04/11  CULTURE, BLOOD (ROUTINE X 2)     Status: Normal   Collection Time   12/04/11  2:40 AM      Component Value Range Status Comment   Specimen Description BLOOD RIGHT HAND   Final    Special Requests BOTTLES DRAWN AEROBIC ONLY 10CC   Final    Culture  Setup Time 12/04/2011 08:46   Final    Culture NO GROWTH 5 DAYS   Final    Report Status 12/10/2011 FINAL   Final   CULTURE, BLOOD (ROUTINE X 2)     Status: Normal   Collection Time   12/04/11  2:50 AM      Component Value Range Status Comment   Specimen Description BLOOD LEFT ARM   Final    Special Requests BOTTLES DRAWN AEROBIC AND ANAEROBIC 10CC EA   Final    Culture  Setup Time 12/04/2011 08:46   Final    Culture NO GROWTH 5 DAYS   Final    Report Status 12/10/2011 FINAL   Final   MRSA PCR SCREENING     Status: Normal   Collection Time   12/04/11  6:03 AM      Component Value Range Status Comment   MRSA by PCR NEGATIVE  NEGATIVE Final   SURGICAL PCR SCREEN     Status: Abnormal   Collection Time   12/05/11  4:59 PM      Component Value Range Status Comment   MRSA, PCR NEGATIVE  NEGATIVE Final    Staphylococcus aureus POSITIVE (*) NEGATIVE Final     Imaging: Mr Foot Left W Wo Contrast  02/18/2012  *RADIOLOGY REPORT*  Clinical Data: Multiple toe amputations, most recently August 2013. Erythema of the remaining fifth toe.  Diabetes.  Query osteomyelitis.  MRI OF THE LEFT FOREFOOT WITHOUT AND WITH CONTRAST  Technique:  Multiplanar, multisequence MR imaging was performed both before and after administration of intravenous contrast.  Contrast: 20mL MULTIHANCE GADOBENATE DIMEGLUMINE 529 MG/ML IV SOLN  Comparison: Multiple exams, including 02/18/2012 and 12/04/2011  Findings: Prior amputation of the first through fourth digits at the level of the metatarsals noted.   Osseous edema in the shaft and head of the fifth metatarsal as well as in the fifth digit phalanges noted with associated enhancement compatible with osteomyelitis.  There is also abnormal edema and enhancement compatible with chronic osteomyelitis in the shaft of the second, third, and fourth metatarsals with associated chronic periostitis especially in the second and third metatarsals.  There is chronic periostitis with irregularity of the distal margin and faintly increased edema and enhancement in the  distal remaining shaft of the first metatarsal, favoring chronic osteomyelitis.  High signal laterally in the base of the first metatarsal is probably degenerative but could conceivably relate to osteomyelitis.  Lisfranc ligament is still intact.  There are degenerative findings at the Lisfranc joint.  No compelling findings of midfoot osteomyelitis in the visualized portion of the midfoot.  Regarding the soft tissues, there is phlegmon surrounding the distal portions of the first through fourth metatarsals along with apparent skin blistering or skin thickening along the prior amputation bed region distally, as well as diffuse infiltrative edema and enhancement in the soft tissues of the distal foot compatible with cellulitis and possibly fasciitis.  No well-defined drainable abscess is observed.  Cellulitis and enhancement extend diffusely in the soft tissues of the fifth toe.  IMPRESSION:  1.  Osteomyelitis of the fifth metatarsal and fifth toe phalanges, with surrounding cellulitis. 2.  Chronic osteomyelitis involving the first through fourth metatarsals. 3.  Diffuse cellulitis with inflammatory phlegmon most concentrated around the heads of the first through fourth metatarsals, potentially with the distal blistering/skin thickening.  A well- defined drainable soft tissue abscess is not observed.   Original Report Authenticated By: Gaylyn Rong, M.D.    Dg Foot Complete Left  02/18/2012  *RADIOLOGY  REPORT*  Clinical Data: Swelling and blistering of left fifth toe. History of multiple toe amputations and diabetes.  LEFT FOOT - COMPLETE 3+ VIEW  Comparison: Left second toe radiographs and left foot MRI performed 12/04/2011  Findings: There is no definite evidence of osseous erosion at the fifth toe to suggest osteomyelitis.  The left fifth toe appears grossly intact.  There is amputation across the first through fourth metatarsals.  Visualized joint spaces are grossly preserved.  Soft tissue swelling is noted at the sites of amputation.  IMPRESSION: No definite evidence of osseous erosion at the fifth toe to suggest osteomyelitis; the fifth toe appears grossly intact.   Original Report Authenticated By: Tonia Ghent, M.D.       Medications:    Infusions:    . [EXPIRED] sodium chloride    . sodium chloride 1,000 mL (02/19/12 0611)  . [DISCONTINUED] sodium chloride    . [DISCONTINUED] sodium chloride 1,000 mL (02/18/12 0544)  . [DISCONTINUED] dextrose 5 % and 0.45% NaCl 1,000 mL (02/18/12 0945)  . [DISCONTINUED] dextrose 5 % and 0.45% NaCl 125 mL/hr at 02/18/12 2347  . [DISCONTINUED] insulin (NOVOLIN-R) infusion 2 Units/hr (02/18/12 1925)  . [DISCONTINUED] insulin (NOVOLIN-R) infusion       Scheduled Medications:    . [EXPIRED] HYDROmorphone      . [COMPLETED] influenza  inactive virus vaccine  0.5 mL Intramuscular Tomorrow-1000  . insulin aspart  0-15 Units Subcutaneous TID WC  . insulin aspart protamine-insulin aspart  28 Units Subcutaneous Q supper  . insulin aspart protamine-insulin aspart  32 Units Subcutaneous Q breakfast  . nicotine  14 mg Transdermal Daily  . piperacillin-tazobactam (ZOSYN)  IV  3.375 g Intravenous Q8H  . [EXPIRED] potassium chloride  10 mEq Intravenous Q1H  . vancomycin  1,000 mg Intravenous Q12H  . [DISCONTINUED] enoxaparin  40 mg Subcutaneous Q24H  . [DISCONTINUED] insulin aspart  0-9 Units Subcutaneous TID WC  . [DISCONTINUED] insulin aspart  0-9  Units Subcutaneous TID WC     PRN Medications: dextrose, HYDROmorphone (DILAUDID) injection, methocarbamol, morphine injection, oxyCODONE, [DISCONTINUED]  HYDROmorphone (DILAUDID) injection, [DISCONTINUED] ondansetron (ZOFRAN) IV, [DISCONTINUED] sodium chloride irrigation    Assessment/ Plan:    Assessment & Plan by Problem:  Mr. Odonohue is a 46 y.o. 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   1) Diabetic Ketoacidosis - Last CBG 231,  - [facility] has closed to 10 as of 2300 11/10 - Pt's sugars dropped to 55 0300 11/11, was switched to sliding scale  - See below for insulin plan - Supplement K is 3.7 - Will need counseling and help with med access prior to discharge.   2) Diabetic foot ulcer/ 5th L toe osteomyelitis/cellulitis  Left foot is wrapped with some blood seepage through bandage.  Pt has been afebrile during this admission and denies fever/chills. - S/p left fifth digit amputation and debridement of left foot - Blood cultures pending  - Continue vanc, zosyn today - Per Ortho's note plan to switch to PO doxycycline tomorrow 11/12 - hydromorphone, morphine, and oxycodone prn for analgesia  3) Likely Type II Diabetes mellitus  Will need to reevaluate home regimen prior to discharge and he will need med assistance. From his last office visit w Dr. Zada Girt that he was supposed to be on Humalog 75/25 36U in am and 30U at suppertime.  Pt endorses using this regimen but once the that bottle was used he began using a 70/30 bottle he already had.  - Case management has been contacted for medication assistance - Will start 70/30 Novalog (pharmacy substitution for Humalog) at lower dose than home regimen due to concern for hypoglycemia and will titrate up as  needed  4) Hypertension  BP has had wide range since admission, 120's-170's/70's-100. From review of office note, it appears that he is on Coreg 6.25 BID, Accuretic 20-12.5 two tablets daily, and amlodipine 5mg  daily.  Pt likely has been without meds for some time. - Restart antihypertensives carvedilol 6.25 BID and HCTZ.   5) Chronic renal insufficiency  Secondary to diabetic nephropathy, patient also s/p nephrectomy of L kidney in 2010 2/2 chronic pyelonephritis and pyonephrosis. Looks like baseline over the past few years has been 1.3.  Pt is making urine. - IVF hydration - Latest Cr 1.31  6) Depression  Patient on Prozac at home.  - Pt tolerating PO will start this today  7) Dispo - Social work has been contacted for potential bus pass given pt's transportation limitations - Case management has been contacted for help with outpt PT/OT, home health wound care, and medication assistance - Plan to dc to home once blood sugars are better controlled and Ortho has signed off on his surgery.     Length of Stay: 1 days   This is a Psychologist, occupational Note.  The care of the patient was discussed with Dr. Heloise Beecham and the assessment and plan formulated with their assistance.  Please see their attached note or addendum for official documentation of the daily encounter.

## 2012-02-20 DIAGNOSIS — M869 Osteomyelitis, unspecified: Secondary | ICD-10-CM

## 2012-02-20 DIAGNOSIS — F3289 Other specified depressive episodes: Secondary | ICD-10-CM

## 2012-02-20 DIAGNOSIS — N182 Chronic kidney disease, stage 2 (mild): Secondary | ICD-10-CM

## 2012-02-20 DIAGNOSIS — F329 Major depressive disorder, single episode, unspecified: Secondary | ICD-10-CM

## 2012-02-20 LAB — CBC
MCHC: 33 g/dL (ref 30.0–36.0)
RDW: 13.5 % (ref 11.5–15.5)

## 2012-02-20 LAB — BASIC METABOLIC PANEL
BUN: 10 mg/dL (ref 6–23)
Creatinine, Ser: 1.4 mg/dL — ABNORMAL HIGH (ref 0.50–1.35)
GFR calc Af Amer: 68 mL/min — ABNORMAL LOW (ref >90)
GFR calc non Af Amer: 59 mL/min — ABNORMAL LOW (ref >90)
Potassium: 3.9 mEq/L (ref 3.5–5.1)

## 2012-02-20 LAB — GLUCOSE, CAPILLARY: Glucose-Capillary: 228 mg/dL — ABNORMAL HIGH (ref 70–99)

## 2012-02-20 MED ORDER — INSULIN ASPART PROT & ASPART (70-30 MIX) 100 UNIT/ML ~~LOC~~ SUSP: SUBCUTANEOUS | Status: DC

## 2012-02-20 MED ORDER — FLUOXETINE HCL 10 MG PO CAPS: 10.0000 mg | ORAL_CAPSULE | Freq: Every day | ORAL | Status: DC

## 2012-02-20 MED ORDER — PANTOPRAZOLE SODIUM 40 MG PO TBEC: 40.0000 mg | DELAYED_RELEASE_TABLET | Freq: Every day | ORAL | Status: DC

## 2012-02-20 MED ORDER — HYDROCHLOROTHIAZIDE 25 MG PO TABS: 25.0000 mg | ORAL_TABLET | Freq: Every day | ORAL | Status: DC

## 2012-02-20 MED ORDER — CARVEDILOL 6.25 MG PO TABS: 6.2500 mg | ORAL_TABLET | Freq: Two times a day (BID) | ORAL | Status: DC

## 2012-02-20 MED ORDER — INSULIN ASPART PROT & ASPART (70-30 MIX) 100 UNIT/ML ~~LOC~~ SUSP: 34.0000 [IU] | Freq: Every day | SUBCUTANEOUS | Status: DC

## 2012-02-20 MED ORDER — METHOCARBAMOL 500 MG PO TABS: 500.0000 mg | ORAL_TABLET | Freq: Four times a day (QID) | ORAL | Status: DC | PRN

## 2012-02-20 MED ORDER — INSULIN ASPART PROT & ASPART (70-30 MIX) 100 UNIT/ML ~~LOC~~ SUSP: 30.0000 [IU] | Freq: Every day | SUBCUTANEOUS | Status: DC

## 2012-02-20 MED ORDER — DOXYCYCLINE HYCLATE 100 MG PO TABS: 100.0000 mg | ORAL_TABLET | Freq: Two times a day (BID) | ORAL | Status: AC

## 2012-02-20 MED ORDER — DOXYCYCLINE HYCLATE 100 MG PO TABS: 100.0000 mg | ORAL_TABLET | Freq: Two times a day (BID) | ORAL | Status: DC

## 2012-02-20 NOTE — Progress Notes (Signed)
Discharge home.

## 2012-02-20 NOTE — Care Management Note (Signed)
  Page 2 of 2   02/20/2012     1:46:04 PM   CARE MANAGEMENT NOTE 02/20/2012  Patient:  [name], [name]   Account Number:  [id number]  Date Initiated:  02/20/2012  Documentation initiated by:  Ronny Flurry  Subjective/Objective Assessment:     Action/Plan:   Anticipated DC Date:     Anticipated DC Plan:  HOME W HOME HEALTH SERVICES         Choice offered to / List presented to:  C-1 Patient        HH arranged  HH-1 RN      [facility] agency  [facility]   Status of service:  Completed, signed off Medicare Important Message given?   (If response is "NO", the following Medicare IM given date fields will be blank) Date Medicare IM given:   Date Additional Medicare IM given:    Discharge Disposition:    Per UR Regulation:  Reviewed for med. necessity/level of care/duration of stay  If discussed at Long Length of Stay Meetings, dates discussed:    Comments:  02-20-12 Instead of using the ZZ fund , patient entered into new MATCH program . Program explained to patient. Patient has $3 co pay and has to get prescriptions filled at [facility] . Patient voiced understanding and agreed . MATCH letter given to patient  DR Bosie Clos will email prescriptions directly to [facility] . Patient aware  Ronny Flurry RN BSN [phone]    02-20-12 Patient eligible for ZZ fund from main pharmacy . MD please leave 2 sets of prescriptions at discharge. Gave patient assiatance applications and coupons for insulins 70/30 and Novolog  [facility] last saw patient in May 2013 . Patient refused visits . RN attempted to visit 4 times and social worker from [facility] attemted to visit 4 times.  [facility] NOT recommending HHPT .  Spoke with patient and his wife , both are willing to allow [facility] assist with wound care. Patient's wife agreeable to be shown and assist with wound care.   Ronny Flurry RN BSN [phone]

## 2012-02-20 NOTE — Progress Notes (Signed)
Resident Addendum to Medical Student Note   I have seen and examined the patient, and agree with the the medical student assessment and plan outlined above. Please see my brief note below for additional details.   S:  Pt doing well, ambulating hallways, without complaint. Denies CP, SOB, N/V, dizziness, abdominal pain, diarrhea. OBJECTIVE:  VS:  Reviewed   Meds:  Reviewed   Labs:  Reviewed   Imaging:  Reviewed   Physical Exam:  Vitals reviewed.  General: walking around room, NAD  HEENT: PERRL, EOMI, MMM of OP  Cardiac: RRR, no rubs, murmurs or gallops  Pulm: clear to auscultation bilaterally, no wheezes, rales, or rhonchi  Abd: soft, nontender, nondistended, BS present  Ext: L foot in pressure dressing rin place, boot over top, walking around room Neuro: alert and oriented X3, cranial nerves II-XII grossly intact, strength intact bilateral upper and lower extremities   ASSESSMENT/ PLAN:  Pt is a 46 y.o. yo male with a PMHx of HTN, PVD, chronic renal insufficiency, and DM who was admitted on 02/18/2012 in DKA in the setting of insulin noncompliance and osteomyelitis of the L 5th toe.  1) Diabetic Ketoacidosis -  AG closed and he was transitioned to 70/30 SQ. No hypoglycemia overnight 2) Diabetic foot ulcer/ 5th L toe osteomyelitis/cellulitis  Per ortho, all of area concerning for osteomyelitis has been surgical removed, treating now for soft tissue infection coverage only. Blood cx NGTD. Vanc/zosyn x3 days, will d/c on doxycycline 10 day course. No rehab needs per PT, wound care home health w assist w dressing changes 3) Hypertension  Restarted pt's Coreg and HCTZ  4) Chronic renal insufficiency  Secondary to diabetic nephropathy, patient also s/p nephrectomy of L kidney in 2010 2/2 chronic pyelonephritis and pyonephrosis. Cr is at baseline, no evidence of acute on chronic disease  5) Depression  Patient on Prozac at home. Restarted here. 6) Dispo  - Home with home health.       Bronson Curb, MD PGY1, Internal Medicine Resident 02/20/2012, 6:19 PM

## 2012-02-20 NOTE — Progress Notes (Signed)
Patient ID: [name], male   DOB: 02-28-1966, 46 y.o.   MRN: [id number] Medical Student Daily Progress Note   Subjective:    Interval Events:  Pt was hypertensive this morning at 0600 w/ BP of 150/85.  Pt endorses some pain at his surgical sight but feels it is well controlled.  Pt concerned about an all over itching and rash that appeared on his back last night, he feels it started after receiving his "blood clot" medicine.  He says the rash and itching resolved with Benadryl.  Per sign out from the night float the pt requested Benadryl for sleep.  During interview pt became agitated talking about follow up, "I'm not gonna go because I don't have transportation so there's no point in making them."  Pt denies HA, vision changes, SOB, chest pain, abdominal pain, dysuria, and weakness/numbness/tingling in extremities.  Pt denies BM and flatus.    Objective:    Vital Signs:   Temp:  [98.1 F (36.7 C)-99.4 F (37.4 C)] 98.4 F (36.9 C) (11/12 0621) Pulse Rate:  [80-95] 87  (11/12 0621) Resp:  [18-20] 18  (11/12 0621) BP: (133-158)/(69-91) 150/85 mmHg (11/12 0621) SpO2:  [99 %-100 %] 99 % (11/12 0621)     Weights: 24-hour Weight change:   Filed Weights   02/18/12 2123  Weight: 95.074 kg (209 lb 9.6 oz)    Intake/Output:   Intake/Output Summary (Last 24 hours) at 02/20/12 1129 Last data filed at 02/20/12 0900  Gross per 24 hour  Intake   4335 ml  Output   2550 ml  Net   1785 ml     Net since admission:  935   Physical Exam: GENERAL:  alert and oriented; resting comfortably in bed and in no distress EYES:  Pupils small but equal, round, and reactive to light; sclera anicteric ENT:  moist mucosa, throat is clean and non-erythematous NECK:  no JVD, no thyromegaly, no lymphadenopathy LUNGS:  clear to auscultation bilaterally, normal work of breathing HEART:  normal rate; regular rhythm; normal S1 and S2, no S3 or S4 appreciated; no murmurs, rubs, or clicks ABDOMEN:  soft,  non-tender, normal bowel sounds, no masses palpated, small (approximately 2 cm) vertical scar present at midline directly above umbilicus. EXTREMITIES:  No peripheral edema, 2 + pulses (bilateral radial, right sided dorsalis pedis, left foot surgical wound appears to be normally healing, sutures are intact across width of foot, blood is present between sutures, no purulence appreciated NEURO:  EOMI, palate rises symmetrically, tongue protrudes to midline, forehead rises symmetrically. SKIN:  No rashes appreciated.      Labs: Basic Metabolic Panel:  Lab 02/20/12 4098 02/18/12 2321 02/18/12 1733 02/18/12 1547 02/18/12 1431  NA 134* 137 138 138 139  K 3.9 3.7 3.8 3.7 3.7  CL 100 102 101 101 100  CO2 26 25 25 24 25   GLUCOSE 230* 229* 204* 193* 165*  BUN 10 12 13 14 15   CREATININE 1.40* 1.31 1.35 1.32 1.31  CALCIUM 8.3* 8.2* 8.6 -- --  MG -- -- -- -- --  PHOS -- -- -- -- --    Liver Function Tests:  Lab 02/18/12 0306  AST 15  ALT 15  ALKPHOS 121*  BILITOT 0.4  PROT 7.9  ALBUMIN 3.7    Lab 02/18/12 0306  LIPASE 78*  AMYLASE 124*   CBC:  Lab 02/20/12 0550 02/18/12 1431 02/18/12 0306  WBC 7.1 15.0* 11.5*  NEUTROABS -- -- --  HGB 10.0* 12.3* 12.5*  HCT 30.3* 36.8* 37.3*  MCV 89.6 88.0 88.2  PLT 264 319 320    Cardiac Enzymes:  Lab 02/18/12 0423  CKTOTAL --  CKMB --  CKMBINDEX --  TROPONINI <0.30   CBG:  Lab 02/20/12 0758 02/19/12 2209 02/19/12 1739 02/19/12 1230 02/19/12 0749  GLUCAP 228* 157* 257* 302* 231*    Microbiology: Results for orders placed during the hospital encounter of 02/18/12  CULTURE, BLOOD (ROUTINE X 2)     Status: Normal (Preliminary result)   Collection Time   02/18/12  2:30 PM      Component Value Range Status Comment   Specimen Description BLOOD HAND RIGHT   Final    Special Requests BOTTLES DRAWN AEROBIC AND ANAEROBIC 10CC   Final    Culture  Setup Time 02/19/2012 00:40   Final    Culture     Final    Value:        BLOOD CULTURE  RECEIVED NO GROWTH TO DATE CULTURE WILL BE HELD FOR 5 DAYS BEFORE ISSUING A FINAL NEGATIVE REPORT   Report Status PENDING   Incomplete   CULTURE, BLOOD (ROUTINE X 2)     Status: Normal (Preliminary result)   Collection Time   02/18/12  2:40 PM      Component Value Range Status Comment   Specimen Description BLOOD HAND LEFT   Final    Special Requests     Final    Value: BOTTLES DRAWN AEROBIC AND ANAEROBIC 10CC BLUE, 6CC RED   Culture  Setup Time 02/19/2012 00:40   Final    Culture     Final    Value:        BLOOD CULTURE RECEIVED NO GROWTH TO DATE CULTURE WILL BE HELD FOR 5 DAYS BEFORE ISSUING A FINAL NEGATIVE REPORT   Report Status PENDING   Incomplete      Medications:    Infusions:    . sodium chloride 1,000 mL (02/19/12 2023)     Scheduled Medications:    . carvedilol  6.25 mg Oral BID WC  . enoxaparin (LOVENOX) injection  40 mg Subcutaneous Q24H  . FLUoxetine  10 mg Oral Daily  . hydrochlorothiazide  25 mg Oral Daily  . insulin aspart  0-15 Units Subcutaneous TID WC  . insulin aspart protamine-insulin aspart  28 Units Subcutaneous Q supper  . insulin aspart protamine-insulin aspart  32 Units Subcutaneous Q breakfast  . nicotine  14 mg Transdermal Daily  . piperacillin-tazobactam (ZOSYN)  IV  3.375 g Intravenous Q8H  . [COMPLETED] potassium chloride SA  40 mEq Oral Once  . vancomycin  1,000 mg Intravenous Q12H  . [DISCONTINUED] enoxaparin (LOVENOX) injection  40 mg Subcutaneous Q24H  . [DISCONTINUED] insulin aspart  0-15 Units Subcutaneous TID WC  . [DISCONTINUED] insulin aspart  0-9 Units Subcutaneous TID WC     PRN Medications: dextrose, diphenhydrAMINE, HYDROmorphone (DILAUDID) injection, methocarbamol, morphine injection, oxyCODONE    Assessment/ Plan:    Assessment & Plan by Problem:  [name] is a 46 y.o. 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   1) Diabetic Ketoacidosis - Last CBG 228 - AG has closed - Pt is tolerating PO - See below for insulin plan - K is 3.9 - Will need counseling  and help with med access prior to discharge.   2) Diabetic foot ulcer/ 5th L toe osteomyelitis/cellulitis  Left foot is wrapped with some blood seepage through bandage.  Pt has been afebrile during this admission and denies fever/chills. - S/p left fifth digit amputation and debridement of left foot - per Ortho all osteomyelitis has been removed - Blood cultures show no growth to date  - dc vanc and zosyn - Switch to PO doxycycline 100 mg BID for 10 days - hydromorphone, morphine, and oxycodone prn for analgesia  3) Likely Type II Diabetes mellitus  - 70/30 Novalog (pharmacy substitution for Humalog) at lower dose than home regimen due to concern for hypoglycemia and will titrate up as needed - SSI  4) Hypertension  Last BP 0621 11/12 was 150/85 - Carvedilol 6.25 BID and HCTZ.   5) Chronic renal insufficiency - Pt is making urine. - IVF hydration - Latest Cr 1.4 (baseline 1.3)  6) Depression   - continuing home Prozac  7) Dispo - Social work has been contacted for potential bus pass given pt's transportation limitations - Pt does not qualify for inpatient rehab - Case management has been contacted for help with outpt home health wound care, and medication assistance - Follow up will be a challenge secondary to transportation issues - Plan to dc to home today.     Length of Stay: 2 days   This is a Psychologist, occupational Note.  The care of the patient was discussed with Dr. Heloise Beecham and the assessment and plan formulated with their assistance.  Please see their attached note or addendum for official documentation of the daily encounter.

## 2012-02-20 NOTE — Discharge Summary (Signed)
Internal Medicine Teaching [facility] Discharge Note  Name: [name] MRN: [id number] DOB: [birth year] 46 y.o.  Date of Admission: 02/18/2012  2:05 AM Date of Discharge: 02/20/2012 Attending Physician: Farley Ly, MD  Discharge Diagnosis: Principal Problem:  *Osteomyelitis of left foot Active Problems: Diabetic Ketoacidosis Hypertension Chronic renal insufficiency, stage 2 Depression Marginal housing  Discharge Medications:   Medication List     As of 02/20/2012 12:17 PM    TAKE these medications         carvedilol 6.25 MG tablet   Commonly known as: COREG   Take 1 tablet (6.25 mg total) by mouth 2 (two) times daily with a meal.      doxycycline 100 MG tablet   Commonly known as: VIBRA-TABS   Take 1 tablet (100 mg total) by mouth 2 (two) times daily.      FLUoxetine 10 MG capsule   Commonly known as: PROZAC   Take 1 capsule (10 mg total) by mouth daily.      hydrochlorothiazide 25 MG tablet   Commonly known as: HYDRODIURIL   Take 1 tablet (25 mg total) by mouth daily.      insulin aspart protamine-insulin aspart (70-30) 100 UNIT/ML injection   Commonly known as: NOVOLOG 70/30   Please inject 34 units in the morning and 30 units in the evening      methocarbamol 500 MG tablet   Commonly known as: ROBAXIN   Take 1 tablet (500 mg total) by mouth every 6 (six) hours as needed.      pantoprazole 40 MG tablet   Commonly known as: PROTONIX   Take 40 mg by mouth daily.        Disposition and follow-up:   [name] was discharged from [facility] in fair condition.  At the hospital follow up visit please address 1) Control of T2DM  Patient not compliant with insulin therapy 2) Wound healing L foot  Please assess for healing of surgical site of L foot and evidence of recurrent cellulitis  Follow-up Appointments:     Follow-up Information    Follow up with Dow Adolph, MD. On 02/28/2012. (at 3:30 pm)    Contact  information:   [facility] [address] [place] [address] [phone]         Discharge Orders    Future Appointments: Provider: Department: Dept Phone: Center:   02/28/2012 3:30 PM Dow Adolph, MD [facility] [phone] [facility]     Future Orders Please Complete By Expires   Diet - low sodium heart healthy      Increase activity slowly      Discharge wound care:      Comments:   Please dress with dry gauze and wraps.      Consultations:  Dr. Magnus Ivan, orthopedics  Procedures Performed:  02/18/12 - Irrigation and debridement of metatarsals of L foot with amputation of L 5th digit, Dr. Magnus Ivan  Mr Foot Left W Wo Contrast  02/18/2012  *RADIOLOGY REPORT*  Clinical Data: Multiple toe amputations, most recently August 2013. Erythema of the remaining fifth toe.  Diabetes.  Query osteomyelitis.  MRI OF THE LEFT FOREFOOT WITHOUT AND WITH CONTRAST  Technique:  Multiplanar, multisequence MR imaging was performed both before and after administration of intravenous contrast.  Contrast: 20mL MULTIHANCE GADOBENATE DIMEGLUMINE 529 MG/ML IV SOLN  Comparison: Multiple exams, including 02/18/2012 and 12/04/2011  Findings: Prior amputation of the first through fourth digits  at the level of the metatarsals noted.  Osseous edema in the shaft and head of the fifth metatarsal as well as in the fifth digit phalanges noted with associated enhancement compatible with osteomyelitis.  There is also abnormal edema and enhancement compatible with chronic osteomyelitis in the shaft of the second, third, and fourth metatarsals with associated chronic periostitis especially in the second and third metatarsals.  There is chronic periostitis with irregularity of the distal margin and faintly increased edema and enhancement in the distal remaining shaft of the first metatarsal, favoring chronic osteomyelitis.  High signal laterally in the base of the first  metatarsal is probably degenerative but could conceivably relate to osteomyelitis.  Lisfranc ligament is still intact.  There are degenerative findings at the Lisfranc joint.  No compelling findings of midfoot osteomyelitis in the visualized portion of the midfoot.  Regarding the soft tissues, there is phlegmon surrounding the distal portions of the first through fourth metatarsals along with apparent skin blistering or skin thickening along the prior amputation bed region distally, as well as diffuse infiltrative edema and enhancement in the soft tissues of the distal foot compatible with cellulitis and possibly fasciitis.  No well-defined drainable abscess is observed.  Cellulitis and enhancement extend diffusely in the soft tissues of the fifth toe.  IMPRESSION:  1.  Osteomyelitis of the fifth metatarsal and fifth toe phalanges, with surrounding cellulitis. 2.  Chronic osteomyelitis involving the first through fourth metatarsals. 3.  Diffuse cellulitis with inflammatory phlegmon most concentrated around the heads of the first through fourth metatarsals, potentially with the distal blistering/skin thickening.  A well- defined drainable soft tissue abscess is not observed.   Original Report Authenticated By: Gaylyn Rong, M.D.    Dg Foot Complete Left  02/18/2012  *RADIOLOGY REPORT*  Clinical Data: Swelling and blistering of left fifth toe. History of multiple toe amputations and diabetes.  LEFT FOOT - COMPLETE 3+ VIEW  Comparison: Left second toe radiographs and left foot MRI performed 12/04/2011  Findings: There is no definite evidence of osseous erosion at the fifth toe to suggest osteomyelitis.  The left fifth toe appears grossly intact.  There is amputation across the first through fourth metatarsals.  Visualized joint spaces are grossly preserved.  Soft tissue swelling is noted at the sites of amputation.  IMPRESSION: No definite evidence of osseous erosion at the fifth toe to suggest osteomyelitis;  the fifth toe appears grossly intact.   Original Report Authenticated By: Tonia Ghent, M.D.      Admission HPI:  [name] is a 46 y.o. male with a PMHx of HTN, PVD, chronic renal insufficiency, and Type 1 DM w/ multiple toe amputations for diabetic foot ulcers presenting with one day of nausea, vomiting and abdominal pain.  Patient reports that last night, he started having nausea, vomiting and stomach pain. He reports that emesis is brown and that he had 6-7 episodes last night. He has also noticed chills, but denies fever. He has had no diarrhea. His vision has also been blurrier than usual over the past couple of days. He says that these are the symptoms he experiences when his blood sugar goes too high, and so he checked it last night and it was in the 500s. He says that he is prescribed Novolog 70/30 at home and is supposed to take 40 units in the morning and 20 units in the evening. He is supposed to monitor glucose at home and adjust insulin, but he cannot afford monitoring supplies and says  he is unable to afford insulin even at a few dollars a bottle. He tries to use as little insulin as he can to make the bottle last longer. He also says he is unable to get transportation to doctor's visits and [place]ies [place]  He also has a history of multiple toe amputations; toes 1-4 on the left foot and the great toe of the right foot have been amputated. Most recently, during his last admission in August, his 2nd and 3rd toes on the left were amputated by Dr. August Saucer due to probably osteomyelitis in the setting of diabetic foot ulcers. He was discharged to rehab at [facility] but said that he had to leave early over "legal issues." Over the past few days, he has noticed swelling and redness in his remaining L 5th toe. He cannot remember when it started because he does not look at toe often. He continues to report decreased sensation in bilateral lower extremities. He has specialized  shoes at home to wear for his amputated toes but wears them irregularly.  He reports constipation with one BM every 3 days, with alternating hard and loose stool. He denies any hematochezia or melena. He denies CP, palpitations; SOB, productive cough, rhinorrhea, sinus pain/pressure; dysuria, urgency, frequency, change in color of urine; diarrhea; weakness or new numbness.  Hospital Course by problem list:  Osteomyelitis/Cellulitis of L foot Pt presented with a leukocytosis and physical exam showed an edematous, erythematous left 5th digit on the left foot.  He was afebrile.  Pt did not complain of pain in his foot but physical exam prompted an MRI w/out contrast of pt's left foot that showed acute osteomyelitis of the fifth digit and signs of chronic osteomyelitis.  The patient was started on iv vancomycin and piperacillin/tazobactam.  Unfortunately, blood cultures were drawn after iv abx were started and did not grow any organisms prior to discharge.  Orthopedics was consulted the day of admission (11/10) and the patient went for amputation of the left foot fifth digit and debridement the same day with no complications.  Ortho saw the patient daily and signed off on the pt 11/12.  A discussion with the surgeon (Dr. Magnus Ivan) confirmed complete removal of infection from the bone, vancomycin and piperacillin/tazobactam were discontinued and PO doxycycline was started to treat pt's cellulitis (10 day course 100 mg BID).  A repeat CBC prior to discharge showed resolution of leukocytosis WBC of 7.1.  The patient remained afebrile throughout this admission.  Analgesia was accomplished with oxycodone and morphine.  DKA and Type II Diabetes   Pt is a type II diabetic that is poorly controlled presented with a one day hx of nausea, abdominal pain, and multiple episodes of vomiting.  Upon questioning the patient reported poor compliance with diabetes therapy (was not test his sugar on a regular basis and was not  dosing his insulin correctly in an attempt to make his supplies last longer).  Initial CBG was 359.  Urinalysis showed ketones and glucose.  Blood tests revealed acetone.  Anion gap at admission was increased to 18.  ABG showed pH 7.4, HCO3 26.3, and pCO2 of 39.5.  Pt's delta-delta gap was 3 indicating metabolic acidosis and metabolic alkalosis which made sense given pt's repeated emesis and hypovolemia.  He was started on aggressive iv fluids and an insulin drip in the ED.  Metabolic panels were gotten regularly and AG closed throughout the day and was 12 at 1730 on 11/10.  Prophylactic replacement of K+ iv was started as well per DKA protocol.  Due to quickness of surgery he was kept on iv insulin overnight (11/10-11/11) even though his AG had closed.  Pt was transitioned to SSI after an episode of hypoglycemia to 55 that same night.  SSI + 70/30 Novalog 32 am, 28 pm was started 11/11 and continued this regimen but he still required SS coverage with several units at meals. He was dischared on novolog 34 qam and 30 qpm.  HTN   Pt had this diagnosis prior to admission but was on no antihypertensives.  BP at admission 167/94 and BP's ranged from 140's-170's/80's-90's until surgery.   Carvedilol and HCTZ were started 11/11 and continued throughout the admission.  Chronic Renal Insufficiency, stage 2  GFR was 77 at admission.  Cr baseline is 1.3 and Cr stayed around this level throughout the admission.  Pt was treated with iv fluids and made urine throughout the admission and renal function was monitored with metabolic panels.  Depression  Pt was admitted with this diagnosis and at admission he had a depressive affect.  Home fluoxetine was started and continued throughout the admission. No suicidal ideation.  Marginal housing  Pt's social situation presented a problem.  Per pt and his wife they live in [place] in a home with no water or power.  Multiple times during morning interviews pt and his wife  became agitated concerning follow up appointments and discharge medications saying, "Don't worry about making the appointments because I don't have transportation.  Don't worry about medications because I can't afford them."  PT assessed the patient and did not feel he qualified for inpatient rehab or further physical therapy.  Social work and case management worked very hard for this patient and he did qualify for home health for wound care.  He was also able to get medication assistance through the [employer] fund and the [facility] program.  Pt also previously had an [employer] but it has since expired.  [facility] was made aware of this and will work to get his card renewed during his follow up appointment.   Discharge Vitals:  BP 150/85  Pulse 87  Temp 98.4 F (36.9 C) (Oral)  Resp 18  Ht 5\' 8"  (1.727 m)  Wt 209 lb 9.6 oz (95.074 kg)  BMI 31.87 kg/m2  SpO2 99%   Discharge Day PE: General: resting in bed, NAD  HEENT: PERRL, EOMI, MMM of OP  Cardiac: RRR, no rubs, murmurs or gallops  Pulm: clear to auscultation bilaterally, no wheezes, rales, or rhonchi  Abd: soft, nontender, nondistended, BS present  Ext: L foot in pressure dressing recently changed by surgery. 2+ PT pulses bilaterally, no edema  Neuro: alert and oriented X3, cranial nerves II-XII grossly intact, strength intact bilateral upper and lower extremities   Discharge Labs:  Results for orders placed during the hospital encounter of 02/18/12 (from the past 24 hour(s))  GLUCOSE, CAPILLARY     Status: Abnormal   Collection Time   02/19/12 12:30 PM      Component Value Range   Glucose-Capillary 302 (*) 70 - 99 mg/dL   Comment 1 Notify RN    GLUCOSE, CAPILLARY     Status: Abnormal   Collection Time   02/19/12  5:39 PM      Component Value Range   Glucose-Capillary 257 (*) 70 - 99 mg/dL   Comment 1 Notify RN    GLUCOSE, CAPILLARY     Status: Abnormal  Collection Time   02/19/12 10:09 PM      Component Value Range   Glucose-Capillary  157 (*) 70 - 99 mg/dL  BASIC METABOLIC PANEL     Status: Abnormal   Collection Time   02/20/12  5:50 AM      Component Value Range   Sodium 134 (*) 135 - 145 mEq/L   Potassium 3.9  3.5 - 5.1 mEq/L   Chloride 100  96 - 112 mEq/L   CO2 26  19 - 32 mEq/L   Glucose, Bld 230 (*) 70 - 99 mg/dL   BUN 10  6 - 23 mg/dL   Creatinine, Ser 0.98 (*) 0.50 - 1.35 mg/dL   Calcium 8.3 (*) 8.4 - 10.5 mg/dL   GFR calc non Af Amer 59 (*) >90 mL/min   GFR calc Af Amer 68 (*) >90 mL/min  CBC     Status: Abnormal   Collection Time   02/20/12  5:50 AM      Component Value Range   WBC 7.1  4.0 - 10.5 K/uL   RBC 3.38 (*) 4.22 - 5.81 MIL/uL   Hemoglobin 10.0 (*) 13.0 - 17.0 g/dL   HCT 11.9 (*) 14.7 - 82.9 %   MCV 89.6  78.0 - 100.0 fL   MCH 29.6  26.0 - 34.0 pg   MCHC 33.0  30.0 - 36.0 g/dL   RDW 56.2  13.0 - 86.5 %   Platelets 264  150 - 400 K/uL  GLUCOSE, CAPILLARY     Status: Abnormal   Collection Time   02/20/12  7:58 AM      Component Value Range   Glucose-Capillary 228 (*) 70 - 99 mg/dL   Comment 1 Notify RN      Signed: Bronson Curb 02/20/2012, 12:17 PM   Time Spent on Discharge: 40 min Services Ordered on Discharge: Home health RN wound care Equipment Ordered on Discharge: none

## 2012-02-20 NOTE — Progress Notes (Signed)
Patient ID: [name], male   DOB: 1966/03/06, 46 y.o.   MRN: [id number] Wound looks really good on left foot post I&D and revision transmet amputation.  New dressing applied.  Unfortunately bad social situation leading to medical noncompliance which we discussed.

## 2012-02-21 ENCOUNTER — Telehealth: Payer: Self-pay | Admitting: Dietician

## 2012-02-21 NOTE — Telephone Encounter (Signed)
Discharge date:02-20-12 Call date: 02-21-12 and  Hospital follow up appointment date: 02-28-12 @ 3:30 Pm with Dr. Zada Girt  Left message. Will try back later

## 2012-02-25 LAB — CULTURE, BLOOD (ROUTINE X 2)
Culture: NO GROWTH
Culture: NO GROWTH

## 2012-02-26 ENCOUNTER — Telehealth: Payer: Self-pay | Admitting: Licensed Clinical Social Worker

## 2012-02-26 NOTE — Telephone Encounter (Signed)
CSW and RD have been attempting to reach [name].  CSW placed called to pt.  CSW left message requesting return call. CSW provided contact hours and phone number.  Pt's EMR states pt was eligible for [facility] program which provides medications for a $3 copay and was provided with assistance and coupons for lantus.  CSW/RD will continue with TOC follow up.

## 2012-02-27 NOTE — Telephone Encounter (Signed)
CSW placed called to pt.  CSW left message requesting return call. CSW provided contact hours and phone number.  CSW lft message reminding [name] of his need to reapply for the [facility] Card if he has not been approved for Disability/Medicaid.  In addition, pt should apply for MAP (Medication Assistance Program) if pt still does not have prescription coverage.  Pt is aware of both programs and has utilized them in the past.

## 2012-02-28 ENCOUNTER — Encounter: Payer: Self-pay | Admitting: Internal Medicine

## 2012-02-28 ENCOUNTER — Ambulatory Visit: Payer: Self-pay | Admitting: Internal Medicine

## 2012-02-28 ENCOUNTER — Encounter: Payer: Self-pay | Admitting: Licensed Clinical Social Worker

## 2012-02-28 ENCOUNTER — Ambulatory Visit (INDEPENDENT_AMBULATORY_CARE_PROVIDER_SITE_OTHER): Payer: Self-pay | Admitting: Internal Medicine

## 2012-02-28 VITALS — BP 139/83 | HR 94 | Temp 97.1°F | Ht 68.0 in | Wt 203.7 lb

## 2012-02-28 DIAGNOSIS — E1149 Type 2 diabetes mellitus with other diabetic neurological complication: Secondary | ICD-10-CM

## 2012-02-28 DIAGNOSIS — L089 Local infection of the skin and subcutaneous tissue, unspecified: Secondary | ICD-10-CM

## 2012-02-28 DIAGNOSIS — I798 Other disorders of arteries, arterioles and capillaries in diseases classified elsewhere: Secondary | ICD-10-CM

## 2012-02-28 DIAGNOSIS — I10 Essential (primary) hypertension: Secondary | ICD-10-CM

## 2012-02-28 DIAGNOSIS — E1159 Type 2 diabetes mellitus with other circulatory complications: Secondary | ICD-10-CM

## 2012-02-28 DIAGNOSIS — M869 Osteomyelitis, unspecified: Secondary | ICD-10-CM

## 2012-02-28 DIAGNOSIS — E11621 Type 2 diabetes mellitus with foot ulcer: Secondary | ICD-10-CM

## 2012-02-28 DIAGNOSIS — E11649 Type 2 diabetes mellitus with hypoglycemia without coma: Secondary | ICD-10-CM

## 2012-02-28 DIAGNOSIS — E1351 Other specified diabetes mellitus with diabetic peripheral angiopathy without gangrene: Secondary | ICD-10-CM

## 2012-02-28 DIAGNOSIS — IMO0002 Reserved for concepts with insufficient information to code with codable children: Secondary | ICD-10-CM

## 2012-02-28 LAB — GLUCOSE, CAPILLARY: Glucose-Capillary: 385 mg/dL — ABNORMAL HIGH (ref 70–99)

## 2012-02-28 MED ORDER — HYDROCODONE-ACETAMINOPHEN 5-300 MG PO TABS: 1.0000 | ORAL_TABLET | Freq: Four times a day (QID) | ORAL | Status: DC | PRN

## 2012-02-28 NOTE — Assessment & Plan Note (Signed)
Blood pressure today is 139/83 which is still above goal of 130/80. [name] reports to be compliant with hydrochlorothiazide 25 mg once daily, and Coreg 6.25 mg. Plan scratches at.  Plan -Unfortunately,we have to discontinue his carvedilol given his risk of hypoglycemia. Carvedilol is likely to mask the symptoms. -At the moment [name] is unable to afford any of his medications.  -I will consider adding an ACE inhibitor once his Medicaid is approved. -I will see him in 2 weeks.

## 2012-02-28 NOTE — Progress Notes (Unsigned)
Patient ID: [name], male   DOB: 1966-01-19, 46 y.o.   MRN: [id number] [name] and spouse present to the [facility] around 10:30 this am requesting to speak with CSW.  Pt has an appt today, late afternoon.  Pt leaves for appt across the street and wife remains to speak with CSW.  Wife states she was told by inpt CSW to request cab voucher for transport home from [facility] appt but would also like [employer] pass for transportation to get something to eat today before [facility] appt.  There is no note by inpt CSW regarding transportation needs or follow up.  CSW and RD attempted several TOC phone calls prior to this appt to determine needs, messages left on pt's cell.   CSW attempted to contact Inpt CSW department to determine if prior cab transportation arrangements had been made for pt.  Awaiting response.   CSW informed [name], [facility] does have general use GTA fares for meals only used to assist pt with getting to/from appt.  CSW does not make cab vouchers readily available to assist with transportation to/from appt, only for extreme cases.  RN staff can assist with cab voucher for transportation home from [facility] appt today.  But, can not occur on a regular basis.  CSW can assist with GTA pass for [facility] appt.

## 2012-02-28 NOTE — Assessment & Plan Note (Addendum)
[name]'s blood sugars continue to be elevated. His blood sugar was 385 mg/dL in the clinic. His HbA1c was 13.1% last week. He reports to be compliant with his insulin. He has challenges of affording meals and he frequently skips meals, resulting into hypoglycemia. The most previous episode of hypoglycemia happened last night.  Plan -Given his recurrent episodes of hypoglycemia and challenges of transportation to access health care, [name] stands a very high risk of life threatening hypoglycemia episodes. I have discussed with Dr. Lonzo Cloud this issue and we feel that the risk of hypoglycemia is higher than that of uncontrolled diabetes in the [name]'s situation. We have thefore made a difficult decision to reduce his dose of insulin to 15 international units in the morning and 18 international units in the evening. This is will be much much safer in the short term. He can be switched to Lantus, which has a lower risk of hypoglycemia once his medicaid is approved. Obviously, this lower insulin dose will not control his blood sugars. -I will continue to follow him and to hope to a change of his financial situation with medicaid which will also provided him with transportation.

## 2012-02-28 NOTE — Assessment & Plan Note (Signed)
Left foot infection seems to be improving with continued use of doxycycline at 100 mg twice a day. He reports to be compliant with this antibiotic. He denies chills, or fevers. His only complaint is pain.  Plan -Continue with doxycycline to complete 10 days of treatment. -Were prescribed Vicodin for pain -Continue with daily dressing. -Will followup with his orthopedic surgeon.

## 2012-02-28 NOTE — Progress Notes (Signed)
Patient ID: TRYSTIN TERHUNE, male   DOB: Sep 03, 1965, 46 y.o.   MRN: [id number]  Subjective:   Patient ID: DAMARKUS BALIS male   DOB: [birth year] 46 y.o.   MRN: [id number]  HPI: Mr.Hogan B Graumann is a 46 y.o. 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  Mr. Kuhnle was discharged from the hospital on February 20, 2012 due to left fifth toe amputation secondary to osteomyelitis.   Left fifth toe osteomyelitis: Mr. Buskey reports that he is improving. He has been seen today by his surgeon who examined the left foot  And advised him to continue with daily dressing. He was treated with 2 days of IV antibiotics and discharged on doxycycline 100 mg twice a day for 10 days. However, he reports that the right foot, which was operated on last year has been a discharging a little of pus. There is no pain in the right foot. He denies history of fevers or chills. Overall, he feels better. He requests for pain medications since he experienced a constant and ache pain in his surgical site which interrupts his sleep.   Diabetes: Mr. Frye was discharged on NovoLog insulin 70/30 at a dose of 36 international units in the morning and 30 international units in the evening. He reports to be compliant with this insulin. However, he reports that he has infrequent meals due to lack of money to buy food. Last night he had an episode of hypoglycemia with his blood sugars going to as low as 23 milligrams per deciliter. An ambulance was called and Mr. Baumgarner was resuscitated with IV dextrose according to his wife. His blood sugars where remeasured after the infusion of the dextrose, and it went gone up to 150 within 15-20 minutes. During that episode, Mr. Pursell was sweating profusely and was 'out of it'. He was not transported to the ED. This morning, he reports that he used  30 IU of insulin instead of 36- being cautious because he had not had enough breakfast. He was been tried on Lantus which was been good for him and was not causing him hypoglycemia. However, he was unable to afford it because he does not have insurance. Therefore, he was switched to NovoLog insulin. Currently, he reports feeling well and has been feeling energetic without any symptoms of palpitations, profuse sweating, or tremors. He reports that he had lunch today.  Limited financial resources: Mr. Bunton care has been complicated by limited financial resources resulting into lack of transportation and inability to afford his medications. He also reports skipping meals due to lack of money. Fortunately, he was recently approved for Medicaid, which will hopefully starts on 03/27/2012. This will help him with transportation for his appointments and also providing him with medications at no cost. His previous orange card expired.      Past Medical History  Diagnosis Date  . Onychomycosis   . Hypertension   . Diabetic peripheral neuropathy   . Diabetic foot ulcer 12/09    left plantar area  . History of drug abuse     Cocaine and marijuana  . Exposure to trichomonas     treated empirically  . Cellulitis of left foot 03/2008    left 4th and 5th metatarsal area  . Hyperlipidemia   . 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 Ulcerative esophagitis 2007    severe, complicated with UGI  bleed  . [place] Artist tear April 2009  . History of prolonged Q-T interval on ECG   . 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  . 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 Chronic renal insufficiency   . Depression   . Peripheral vascular disease   . Type II diabetes mellitus   . GERD (gastroesophageal reflux disease)    Current Outpatient Prescriptions  Medication Sig Dispense Refill  . carvedilol (COREG) 6.25 MG tablet Take 1 tablet  (6.25 mg total) by mouth 2 (two) times daily with a meal.  60 tablet  5  . doxycycline (VIBRA-TABS) 100 MG tablet Take 1 tablet (100 mg total) by mouth 2 (two) times daily.  20 tablet  0  . FLUoxetine (PROZAC) 10 MG capsule Take 1 capsule (10 mg total) by mouth daily.  30 capsule  5  . hydrochlorothiazide (HYDRODIURIL) 25 MG tablet Take 1 tablet (25 mg total) by mouth daily.  30 tablet  5  . insulin aspart protamine-insulin aspart (NOVOLOG 70/30) (70-30) 100 UNIT/ML injection Please inject 34 units in the morning and 30 units in the evening  10 mL  12  . methocarbamol (ROBAXIN) 500 MG tablet Take 1 tablet (500 mg total) by mouth every 6 (six) hours as needed.  28 tablet  0  . pantoprazole (PROTONIX) 40 MG tablet Take 1 tablet (40 mg total) by mouth daily.  30 tablet  3  . Hydrocodone-Acetaminophen (VICODIN) 5-300 MG TABS Take 1-2 tablets by mouth every 6 (six) hours as needed.  28 each  0   Family History  Problem Relation Age of Onset  . Diabetes type II Mother   . Pancreatic cancer Father    History   Social History  . Marital Status: Married    Spouse Name: N/A    Number of Children: N/A  . Years of Education: N/A   Social History Main Topics  . Smoking status: Current Some Day Smoker -- 0.5 packs/day for 34 years    Types: Cigarettes  . Smokeless tobacco: Never Used     Comment: Has stopped 2 weeks ago- QUIT line info given to pt and wife for asst with smoking patchers, etc  . Alcohol Use: Yes     Comment: 12/05/2011 "occasionally; maybe New Years"  . Drug Use: Yes    Special: Marijuana     Comment: 12/05/2011 "last time ~ 2 wk ago"  . Sexually Active: Yes   Other Topics Concern  . None   Social History Narrative  . None   Review of Systems: Constitutional: Denies fever, chills, reports diaphoresis which happened last night associated with sweating and altered mental status for about 1 hour. His blood sugar was 23. He denies change in appetite change and fatigue.  HEENT:  Denies photophobia, eye pain, redness, hearing loss, ear pain, congestion, sore throat, rhinorrhea, sneezing, mouth sores, trouble swallowing, neck pain, neck stiffness and tinnitus.   Respiratory: Denies SOB, DOE, cough, chest tightness,  and wheezing.   Cardiovascular: Denies chest pain, palpitations and leg swelling.  Gastrointestinal: Denies nausea, vomiting, abdominal pain, diarrhea, constipation, blood in stool and abdominal distention.  Genitourinary: Denies dysuria, urgency, frequency, hematuria, flank pain and difficulty urinating.  Musculoskeletal: Denies myalgias, back pain, joint swelling, arthralgias and gait problem.  Skin: Denies pallor, rash and wound.  Neurological: Denies dizziness, seizures, syncope, weakness, light-headedness, numbness and headaches.  Hematological: Denies adenopathy. Easy bruising,  personal or family bleeding history  Psychiatric/Behavioral: Denies suicidal ideation, mood changes, confusion, nervousness, sleep disturbance and agitation  Objective:  Physical Exam: Filed Vitals:   02/28/12 1447  BP: 139/83  Pulse: 94  Temp: 97.1 F (36.2 C)  TempSrc: Oral  Height: 5\' 8"  (1.727 m)  Weight: 203 lb 11.2 oz (92.398 kg)  SpO2: 100%   His wife was in the room. Constitutional: Vital signs reviewed.  Patient is a well-developed and well-nourished  in no acute distress and cooperative with exam. Alert and oriented x3.  Head: Normocephalic and atraumatic Ear: TM normal bilaterally Mouth: no erythema or exudates, MMM Eyes: PERRL, EOMI, conjunctivae normal, No scleral icterus.  Neck: Supple, Trachea midline normal ROM, No JVD, mass, thyromegaly, or carotid bruit present.  Cardiovascular: RRR, S1 normal, S2 normal, no MRG, pulses symmetric and intact bilaterally Pulmonary/Chest: CTAB, no wheezes, rales, or rhonchi Abdominal: Soft. Non-tender, non-distended, bowel sounds are normal, no masses, organomegaly, or guarding present.  GU: no CVA  tenderness Musculoskeletal: Left foot was not examined because it had just been checked by his surgeon a few hours earlier. However, the leg was not swollen, no increased warmth and it looked normal. The dressings are clean and dry. No foul smell.  Right foot: Has a 4 cm open ulcer over the the stump from previous first toe amputation. The surrounding skin does not appear erythematous and is nontender. There is no pus discharge. The base of the ulcer looks red with some granulomatous tissue. The foot is not swollen. A thorough diabetic foot exam has been documented. Hematology: no cervical, inginal, or axillary adenopathy.  Neurological: A&O x3, Strength is normal and symmetric bilaterally, cranial nerve II-XII are grossly intact, no focal motor deficit, sensory intact to light touch bilaterally.  Skin: Warm, dry and intact. No rash, cyanosis, or clubbing.  Psychiatric: Normal mood and affect. speech and behavior is normal. Judgment and thought content normal. Cognition and memory are normal.   Assessment & Plan:  I have discussed the assessment and plan for the care of Mr. Boehne with Dr. Lonzo Cloud as detailed under each problem. (Mr. Meyerhoff left before has been evaluated by Dr Lonzo Cloud. He was hurrying to get cab voucher. I gave him all instructions by phone).   In summary, Mr. Greenwood reports life threatening episodes of hypoglycemia. His dose of insulin dose will be reduced to 18 international units in the evening and 15 international units in the morning. Once his medicaid starts, he will be switched to Lantus insulin. His carvedilol has been discontinued for now due to the risk of masking hypoglycemia. He will continue with his doxycycline to complete a 10 days course. I have prescribed 28 tablets of Vicodin 5 /300 for control of his left foot pain. He will follow up in 2 weeks.

## 2012-02-28 NOTE — Assessment & Plan Note (Signed)
[name] had another episode of hypoglycemia last night. This has been a recurrent problem with her current insulin of NovoLog.  Plan -Will reduce his insulin to 15 international units in the morning and 18 international units in the evening. -Will discontinue carvediol to reduce the risk of masking of hypoglycemic symptoms. -Once his insurance comes through, Mr. Antonacci will be switched to Lantus, which low risk of hypoglycemia.

## 2012-02-28 NOTE — Patient Instructions (Signed)
Please stop taking Carvediolol  Please reduce the dose of Insulin 70/30 to 15 Units in the morning and 18 units the evening  I will call in your Vicodin for pain Please continue with the Doxycyline to complete 10 days  Please come back to the clinic in 1 month

## 2012-02-29 NOTE — Progress Notes (Signed)
INTERNAL MEDICINE TEACHING ATTENDING ADDENDUM Lonzo Cloud , MD: I  Could not personally see and evaluate Mr.Ante in this clinic visit in conjunction with the resident, Dr. Fatima Blank since he had left the clinic when we were discussing his plan of care. I agree with the clinic note including the plan.

## 2012-03-13 ENCOUNTER — Other Ambulatory Visit: Payer: Self-pay | Admitting: *Deleted

## 2012-03-13 DIAGNOSIS — K219 Gastro-esophageal reflux disease without esophagitis: Secondary | ICD-10-CM | POA: Insufficient documentation

## 2012-03-13 MED ORDER — PANTOPRAZOLE SODIUM 40 MG PO TBEC: 40.0000 mg | DELAYED_RELEASE_TABLET | Freq: Every day | ORAL | Status: DC

## 2012-03-14 ENCOUNTER — Telehealth: Payer: Self-pay | Admitting: Licensed Clinical Social Worker

## 2012-03-14 NOTE — Telephone Encounter (Signed)
CSW placed call to pt to inform of new transportation resource available [facility] pt's in [place] to [facility] appt.  Pt's spouse answered and states pt now has Medicaid, as his disability was approved.  Pt has transportation available through Medicaid benefit.

## 2012-03-15 ENCOUNTER — Telehealth: Payer: Self-pay | Admitting: *Deleted

## 2012-03-15 NOTE — Telephone Encounter (Signed)
[facility] pharmacy called about Protonix - new order one tablet daily  - pt had been taking med twice a day.[facility] just wanted to check. Stanton Kidney Nataliah Hatlestad RN 03/15/12 2:15PM

## 2012-03-20 ENCOUNTER — Telehealth: Payer: Self-pay | Admitting: *Deleted

## 2012-03-20 NOTE — Telephone Encounter (Signed)
Called to pharm 

## 2012-03-20 NOTE — Telephone Encounter (Signed)
Health dept has pt taking protonix 40mg  twice daily, last script for 1 time daily, please clarify

## 2012-03-20 NOTE — Telephone Encounter (Signed)
I called the pharmacy and clarified on about the prescription for Protonix. It is 40 mg once daily.

## 2012-04-22 NOTE — Assessment & Plan Note (Signed)
12/20/2011:  [name] states that he takes his insulin 70/30 40 units in the morning and 34 units in the evening.  He is taking it about 5 minutes before he eats.  He states that he has been having problems with his blood sugar crashing about 15-30 minutes after he eats.  He states that when he feels low he gets sweating, shakes, and dizzy.  He then checks his sugar and drinks OJ or eats a candy to bring it up.    Hemoglobin A1C (%)  Date Value  12/04/2011 11.9*  08/22/2011 14.2*  03/28/2011 12.7    His last A1C in August was better controlled then previous likely secondary to better medication compliance.  We will plan on switching his insulin to 75/25 and having him try to take his medication 15-30 minutes prior to his meal to try to time the short acting spike with the food intake.

## 2012-04-22 NOTE — Assessment & Plan Note (Signed)
12/20/2011:  BP Readings from Last 1 Encounters:  12/20/11 150/96   His BP today is elevated.  We will start Coreg 6.25 mg BID for his BP and have him return in 2 weeks for a recheck.

## 2012-04-22 NOTE — Assessment & Plan Note (Signed)
[name] states that he has been doing better from a mood standpoint since starting the Prozac over a year ago.  He would like to try to taper off of the medication.  He denies depressed mood, increased irritability, SI or HI.  We will work to taper him down to 10 mg daily and then stop.

## 2012-04-26 ENCOUNTER — Ambulatory Visit (HOSPITAL_COMMUNITY)
Admission: RE | Admit: 2012-04-26 | Discharge: 2012-04-26 | Disposition: A | Discharge status: Home / Self Care | Payer: Medicaid Other | Source: Ambulatory Visit | Attending: General Surgery | Admitting: General Surgery

## 2012-04-26 ENCOUNTER — Other Ambulatory Visit (HOSPITAL_BASED_OUTPATIENT_CLINIC_OR_DEPARTMENT_OTHER): Payer: Self-pay | Admitting: General Surgery

## 2012-04-26 ENCOUNTER — Encounter (HOSPITAL_BASED_OUTPATIENT_CLINIC_OR_DEPARTMENT_OTHER): Payer: Medicaid Other | Attending: General Surgery

## 2012-04-26 DIAGNOSIS — M869 Osteomyelitis, unspecified: Secondary | ICD-10-CM

## 2012-04-26 DIAGNOSIS — M7989 Other specified soft tissue disorders: Secondary | ICD-10-CM | POA: Insufficient documentation

## 2012-04-26 DIAGNOSIS — S98139A Complete traumatic amputation of one unspecified lesser toe, initial encounter: Secondary | ICD-10-CM | POA: Insufficient documentation

## 2012-04-26 DIAGNOSIS — I10 Essential (primary) hypertension: Secondary | ICD-10-CM | POA: Insufficient documentation

## 2012-04-26 DIAGNOSIS — S98919A Complete traumatic amputation of unspecified foot, level unspecified, initial encounter: Secondary | ICD-10-CM | POA: Insufficient documentation

## 2012-04-26 DIAGNOSIS — Z79899 Other long term (current) drug therapy: Secondary | ICD-10-CM | POA: Insufficient documentation

## 2012-04-26 DIAGNOSIS — M7730 Calcaneal spur, unspecified foot: Secondary | ICD-10-CM | POA: Insufficient documentation

## 2012-04-26 DIAGNOSIS — L97509 Non-pressure chronic ulcer of other part of unspecified foot with unspecified severity: Secondary | ICD-10-CM | POA: Insufficient documentation

## 2012-04-26 DIAGNOSIS — K219 Gastro-esophageal reflux disease without esophagitis: Secondary | ICD-10-CM | POA: Insufficient documentation

## 2012-04-26 DIAGNOSIS — E1169 Type 2 diabetes mellitus with other specified complication: Secondary | ICD-10-CM | POA: Insufficient documentation

## 2012-04-27 NOTE — H&P (Cosign Needed)
NAMEBRIA, [name] NO.:  [id number]  MEDICAL RECORD NO.:  [id number]  LOCATION:  FOOT                         FACILITY:  [facility]  PHYSICIAN:  Joanne Gavel, M.D.        DATE OF BIRTH:  [birth year]  DATE OF ADMISSION:  04/26/2012 DATE OF DISCHARGE:                             HISTORY & PHYSICAL   CHIEF COMPLAINT:  Wounds, both feet.  HISTORY OF PRESENT ILLNESS:  This 47 year old male with a long history of diabetes mellitus with severe neuropathy.  Starting a year so ago, he began having toe amputations, first the right great toe, then every 3 months or so another toe from the left foot until he now has all his toes removed from the left foot.  These wounds have failed to heal. Recently have opened up.  PAST MEDICAL HISTORY:  Significant for diabetes, hypertension, and GERD.  PAST SURGICAL HISTORY:  Multiple toe amputations and removal of left kidney for infection several years ago.  ALLERGIES:  None.  MEDICATIONS:  Coreg, Prozac, HydroDIURIL, NovoLog, Robaxin, and Protonix.  HABITS:  Cigarettes:  He has quit smoking, but he has smoked until recently.  Alcohol:  Denies.  REVIEW OF SYSTEMS:  As above.  PHYSICAL EXAMINATION:  VITAL SIGNS:  Temperature 97.8, pulse 94, respirations 18, blood pressure 116/76.  Glucose is 305. GENERAL APPEARANCE:  A well developed, well nourished, in no distress. CHEST:  Clear. HEART:  Regular rhythm. EXTREMITIES:  Reveals good peripheral pulses.  The left foot has a failed trans met stump that is 1 mm in length, 3 mm in width covered with black eschar and necrotic tissue.  The right great toe has a 1.5 x 2.0 ulceration, much more superficial.  IMPRESSION:  Diabetic foot ulcers, Wagner 3 bilateral, failure of amputation flap bilaterally.  TREATMENT:  After sharp debridement, we will start with Santyl and HydroDIURIL every other day.  Get x-rays of the feet to rule out osteo. Routine laboratory tests, arterial and venous  studies.  I believe he may need further toe amputation on the right and perhaps revision of amputation on the left, but that ultimately the foot salvage will probably be obtainable.     Joanne Gavel, M.D.     RA/MEDQ  D:  04/26/2012  T:  04/27/2012  Job:  [id number]

## 2012-05-03 ENCOUNTER — Other Ambulatory Visit (HOSPITAL_COMMUNITY): Payer: Self-pay | Admitting: General Surgery

## 2012-05-03 DIAGNOSIS — I739 Peripheral vascular disease, unspecified: Secondary | ICD-10-CM

## 2012-05-06 ENCOUNTER — Encounter: Payer: Self-pay | Admitting: Internal Medicine

## 2012-05-08 ENCOUNTER — Encounter (HOSPITAL_COMMUNITY): Payer: Medicaid Other

## 2012-05-17 ENCOUNTER — Encounter (HOSPITAL_BASED_OUTPATIENT_CLINIC_OR_DEPARTMENT_OTHER): Payer: Medicaid Other | Attending: General Surgery

## 2012-05-17 DIAGNOSIS — E1169 Type 2 diabetes mellitus with other specified complication: Secondary | ICD-10-CM | POA: Insufficient documentation

## 2012-05-17 DIAGNOSIS — S98119A Complete traumatic amputation of unspecified great toe, initial encounter: Secondary | ICD-10-CM | POA: Insufficient documentation

## 2012-05-17 DIAGNOSIS — L97509 Non-pressure chronic ulcer of other part of unspecified foot with unspecified severity: Secondary | ICD-10-CM | POA: Insufficient documentation

## 2012-05-17 DIAGNOSIS — L84 Corns and callosities: Secondary | ICD-10-CM | POA: Insufficient documentation

## 2012-05-22 ENCOUNTER — Telehealth: Payer: Self-pay | Admitting: Dietician

## 2012-05-22 NOTE — Telephone Encounter (Signed)
Left message encouraging patient to schedule an appointment with his primary care physician.

## 2012-05-30 ENCOUNTER — Encounter: Payer: Self-pay | Admitting: Internal Medicine

## 2012-06-05 ENCOUNTER — Encounter: Payer: Self-pay | Admitting: Internal Medicine

## 2012-06-05 ENCOUNTER — Other Ambulatory Visit: Payer: Self-pay | Admitting: Internal Medicine

## 2012-06-05 MED ORDER — LISINOPRIL 20 MG PO TABS: 20.0000 mg | ORAL_TABLET | Freq: Every day | ORAL | Status: DC

## 2012-06-14 ENCOUNTER — Encounter (HOSPITAL_BASED_OUTPATIENT_CLINIC_OR_DEPARTMENT_OTHER): Payer: Medicaid Other | Attending: General Surgery

## 2012-06-14 DIAGNOSIS — L97509 Non-pressure chronic ulcer of other part of unspecified foot with unspecified severity: Secondary | ICD-10-CM | POA: Insufficient documentation

## 2012-06-14 DIAGNOSIS — E1169 Type 2 diabetes mellitus with other specified complication: Secondary | ICD-10-CM | POA: Insufficient documentation

## 2012-06-21 ENCOUNTER — Ambulatory Visit (INDEPENDENT_AMBULATORY_CARE_PROVIDER_SITE_OTHER): Payer: Medicaid Other | Admitting: Internal Medicine

## 2012-06-21 VITALS — BP 147/95 | HR 98 | Temp 97.8°F | Ht 68.0 in | Wt 204.5 lb

## 2012-06-21 DIAGNOSIS — I10 Essential (primary) hypertension: Secondary | ICD-10-CM

## 2012-06-21 DIAGNOSIS — E1149 Type 2 diabetes mellitus with other diabetic neurological complication: Secondary | ICD-10-CM

## 2012-06-21 LAB — CBC
MCV: 86.3 fL (ref 78.0–100.0)
Platelets: 395 10*3/uL (ref 150–400)
RBC: 4.37 MIL/uL (ref 4.22–5.81)
WBC: 8.9 10*3/uL (ref 4.0–10.5)

## 2012-06-21 LAB — BASIC METABOLIC PANEL WITH GFR
CO2: 26 mEq/L (ref 19–32)
Calcium: 9.3 mg/dL (ref 8.4–10.5)
Chloride: 97 mEq/L (ref 96–112)
Potassium: 4.4 mEq/L (ref 3.5–5.3)
Sodium: 131 mEq/L — ABNORMAL LOW (ref 135–145)

## 2012-06-21 LAB — LIPID PANEL
HDL: 73 mg/dL (ref >39)
Total CHOL/HDL Ratio: 3.2 Ratio

## 2012-06-21 LAB — GLUCOSE, CAPILLARY: Glucose-Capillary: 442 mg/dL — ABNORMAL HIGH (ref 70–99)

## 2012-06-21 MED ORDER — GLUCOSE BLOOD VI STRP: ORAL_STRIP | Status: DC

## 2012-06-21 MED ORDER — ACCU-CHEK FASTCLIX LANCETS MISC: 1.0000 | Freq: Every day | Status: DC

## 2012-06-21 MED ORDER — LISINOPRIL 20 MG PO TABS: 20.0000 mg | ORAL_TABLET | Freq: Every day | ORAL | Status: DC

## 2012-06-21 MED ORDER — INSULIN GLARGINE 100 UNIT/ML ~~LOC~~ SOLN: SUBCUTANEOUS | Status: DC

## 2012-06-21 MED ORDER — GABAPENTIN 300 MG PO CAPS: 300.0000 mg | ORAL_CAPSULE | Freq: Three times a day (TID) | ORAL | Status: DC

## 2012-06-21 MED ORDER — FLUOXETINE HCL 10 MG PO CAPS: 10.0000 mg | ORAL_CAPSULE | Freq: Every day | ORAL | Status: DC

## 2012-06-21 NOTE — Assessment & Plan Note (Signed)
BP elevated today. Patient has not been taking his medication. I will refill Lisinopril. I will obtain a bmet

## 2012-06-21 NOTE — Assessment & Plan Note (Signed)
Would prefer no strong pain medication. I will start patient on Gabapentin 300 TID for now . It should be increased during next office visit

## 2012-06-21 NOTE — Assessment & Plan Note (Signed)
Will restart Prozac

## 2012-06-21 NOTE — Progress Notes (Signed)
Subjective:   Patient ID: [name] male   DOB: Apr 01, 1966 47 y.o.   MRN: [id number]  HPI: [name] is a 47 y.o. male with PMH significant as outlined below who presented to the clinic for a follow up and refill on his medications. Patient reports that he ran out of all his medication but would be able to get it since he has Medicaid. He was cutting down on his Insulin dosage to safe it until seen in the clinic.   Patient noted that he wants definitely a refill on Prozac due to significant mood changes. He sleeps a lot and does not want to do anything . Since he was not able to take his medication he noticed the changes. He denies any suicidal or homicidal ideation.   Patient is seen on a weekly basis at the wound center.    Past Medical History  Diagnosis Date  . Onychomycosis   . Hypertension   . Diabetic peripheral neuropathy   . Diabetic foot ulcer 12/09    left plantar area  . History of drug abuse     Cocaine and marijuana  . Exposure to trichomonas     treated empirically  . Cellulitis of left foot 03/2008    left 4th and 5th metatarsal area  . Hyperlipidemia   . 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 Ulcerative esophagitis 2007    severe, complicated with UGI bleed  . Mallory Janann August tear April 2009  . History of prolonged Q-T interval on ECG   . 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  . 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 Chronic renal insufficiency   . Depression   . Peripheral vascular disease   . Type II diabetes mellitus   . GERD (gastroesophageal reflux disease)    Current Outpatient Prescriptions  Medication Sig Dispense Refill  . ACCU-CHEK FASTCLIX LANCETS MISC 1 each by Does not apply route daily before breakfast.  102 each  2  . FLUoxetine (PROZAC) 10 MG capsule Take 1 capsule (10 mg total) by mouth  daily.  90 capsule  0  . gabapentin (NEURONTIN) 300 MG capsule Take 1 capsule (300 mg total) by mouth 3 (three) times daily.  90 capsule  2  . glucose blood (ACCU-CHEK SMARTVIEW) test strip Use as instructed  100 each  12  . insulin glargine (LANTUS) 100 UNIT/ML injection Inject 20 units before bedtime  10 mL  3  . lisinopril (PRINIVIL,ZESTRIL) 20 MG tablet Take 1 tablet (20 mg total) by mouth daily.  90 tablet  0  . pantoprazole (PROTONIX) 40 MG tablet Take 1 tablet (40 mg total) by mouth daily.  30 tablet  1   No current facility-administered medications for this visit.   Family History  Problem Relation Age of Onset  . Diabetes type II Mother   . Pancreatic cancer Father    History   Social History  . Marital Status: Married    Spouse Name: N/A    Number of Children: N/A  . Years of Education: N/A   Social History Main Topics  . Smoking status: Current Some Day Smoker -- 0.50 packs/day for 34 years    Types: Cigarettes  . Smokeless tobacco: Never Used     Comment: Has stopped 2 weeks ago- QUIT line info given to pt and wife for asst with smoking  patchers, etc  . Alcohol Use: Yes     Comment: 12/05/2011 "occasionally; maybe New Years"  . Drug Use: Yes    Special: Marijuana     Comment: 12/05/2011 "last time ~ 2 wk ago"  . Sexually Active: Yes   Other Topics Concern  . Not on file   Social History Narrative  . No narrative on file   Review of Systems: Constitutional: Denies fever, chills, diaphoresis, appetite change and fatigue.  Respiratory: Denies SOB, DOE, cough, chest tightness,  and wheezing.   Cardiovascular: Denies chest pain, palpitations and leg swelling.  Gastrointestinal: Denies nausea, vomiting, abdominal pain, diarrhea, constipation, blood in stool and abdominal distention.  Genitourinary: Denies dysuria, urgency, frequency, hematuria, flank pain and difficulty urinating.   Neurological: Denies dizziness,  Psychiatric/Behavioral: Denies suicidal ideation  but noted mood changes,  nervousness, sleep disturbance   Objective:  Physical Exam: Filed Vitals:   06/21/12 1652  BP: 147/95  Pulse: 98  Temp: 97.8 F (36.6 C)  TempSrc: Oral  Height: 5\' 8"  (1.727 m)  Weight: 204 lb 8 oz (92.761 kg)  SpO2: 100%   Constitutional: Vital signs reviewed.  Patient is a well-developed and well-nourished male in no acute distress and cooperative with exam. Alert and oriented x3.  Cardiovascular: RRR, S1 normal, S2 normal, no MRG, pulses symmetric and intact bilaterally Pulmonary/Chest: CTAB, no wheezes, rales, or rhonchi Abdominal: Soft. Non-tender, non-distended, bowel sounds are normal,  Musculoskeletal: special boots in place for both feets  Psychiatric: Normal mood and affect. speech and behavior is normal.

## 2012-06-21 NOTE — Patient Instructions (Signed)
Please take Lantus 20 units daily before bedtime  Check your blood sugars daily before breakfast

## 2012-06-21 NOTE — Assessment & Plan Note (Signed)
Uncontrolled likely due to medical noncompliance. I will start patient on Lantus 20 units daily ( 0.5 units/kg/ day) This must be likely increased but patient had history of significant Hypoglycemia in the past.  Provided patient with an Accu -meter along with strips Obtained urine micro albumin/crea ration. Obtained Lipid panel today

## 2012-06-22 LAB — MICROALBUMIN / CREATININE URINE RATIO: Microalb, Ur: 6.14 mg/dL — ABNORMAL HIGH (ref 0.00–1.89)

## 2012-06-24 ENCOUNTER — Telehealth: Payer: Self-pay | Admitting: Internal Medicine

## 2012-06-24 DIAGNOSIS — N182 Chronic kidney disease, stage 2 (mild): Secondary | ICD-10-CM

## 2012-06-24 MED ORDER — PRAVASTATIN SODIUM 20 MG PO TABS: 20.0000 mg | ORAL_TABLET | Freq: Every day | ORAL | Status: DC

## 2012-06-24 NOTE — Assessment & Plan Note (Addendum)
Worsening renal function. Need to be rechecked during next office visit. If stable would consider to increase Lisinopril since worsening microalbinuria.

## 2012-06-24 NOTE — Telephone Encounter (Signed)
Discussed with patient about changes

## 2012-07-03 ENCOUNTER — Ambulatory Visit (INDEPENDENT_AMBULATORY_CARE_PROVIDER_SITE_OTHER): Payer: Medicaid Other | Admitting: Internal Medicine

## 2012-07-03 ENCOUNTER — Other Ambulatory Visit: Payer: Self-pay | Admitting: *Deleted

## 2012-07-03 ENCOUNTER — Encounter: Payer: Self-pay | Admitting: Internal Medicine

## 2012-07-03 VITALS — BP 130/80 | HR 80 | Temp 97.8°F | Ht 68.0 in | Wt 203.7 lb

## 2012-07-03 DIAGNOSIS — N182 Chronic kidney disease, stage 2 (mild): Secondary | ICD-10-CM

## 2012-07-03 DIAGNOSIS — E1159 Type 2 diabetes mellitus with other circulatory complications: Secondary | ICD-10-CM

## 2012-07-03 DIAGNOSIS — I10 Essential (primary) hypertension: Secondary | ICD-10-CM

## 2012-07-03 DIAGNOSIS — E1149 Type 2 diabetes mellitus with other diabetic neurological complication: Secondary | ICD-10-CM

## 2012-07-03 DIAGNOSIS — F329 Major depressive disorder, single episode, unspecified: Secondary | ICD-10-CM

## 2012-07-03 DIAGNOSIS — Z0000 Encounter for general adult medical examination without abnormal findings: Secondary | ICD-10-CM

## 2012-07-03 DIAGNOSIS — K219 Gastro-esophageal reflux disease without esophagitis: Secondary | ICD-10-CM

## 2012-07-03 DIAGNOSIS — E785 Hyperlipidemia, unspecified: Secondary | ICD-10-CM

## 2012-07-03 DIAGNOSIS — E11621 Type 2 diabetes mellitus with foot ulcer: Secondary | ICD-10-CM

## 2012-07-03 MED ORDER — INSULIN GLARGINE 100 UNIT/ML ~~LOC~~ SOLN: SUBCUTANEOUS | Status: DC

## 2012-07-03 MED ORDER — ATORVASTATIN CALCIUM 20 MG PO TABS: 20.0000 mg | ORAL_TABLET | Freq: Every day | ORAL | Status: DC

## 2012-07-03 MED ORDER — LISINOPRIL 20 MG PO TABS: 40.0000 mg | ORAL_TABLET | Freq: Every day | ORAL | Status: DC

## 2012-07-03 MED ORDER — PANTOPRAZOLE SODIUM 40 MG PO TBEC: 40.0000 mg | DELAYED_RELEASE_TABLET | Freq: Every day | ORAL | Status: DC

## 2012-07-03 NOTE — Patient Instructions (Signed)
General Instructions: Please take you medication as prescribed I have increased to insulin 25 units at bedtime. Please continue 25 units and if your blood sugars remain above 200, you may increase your insulin to 30 units within one week. Please come back to see me in 2 weeks. Please remember to bring by mouth glucose meter I will initiate P4CC to help you with your healthcare Goals.   Treatment Goals:  Goals (1 Years of Data) as of 07/03/12         As of Today As of Today 06/21/12 02/28/12 02/20/12     Blood Pressure    . Blood Pressure < 130/80  130/80 151/92 147/95 139/83 159/93    . Blood Pressure < 140/90  130/80 151/92 147/95 139/83 159/93     Lifestyle    . Quit smoking / using tobacco           Result Component    . HEMOGLOBIN A1C < 8.0    13.7      . LDL CALC < 100    129      . LDL CALC < 100    129        Progress Toward Treatment Goals:  Treatment Goal 07/03/2012  Hemoglobin A1C deteriorated  Stop smoking smoking the same amount    Self Care Goals & Plans:  Self Care Goal 07/03/2012  Manage my medications take my medicines as prescribed  Monitor my health keep track of my blood glucose; bring my blood pressure log to each visit; bring my glucose meter and log to each visit  Be physically active find an activity I enjoy    Home Blood Glucose Monitoring 07/03/2012  Check my blood sugar 2 times a day  When to check my blood sugar before breakfast; before lunch; before dinner     Care Management & Community Referrals:  Referral 07/03/2012  Referrals made for care management support [facility] case management program

## 2012-07-03 NOTE — Progress Notes (Signed)
Patient ID: [name], male   DOB: [birth year], 47 y.o.   MRN: [id number]  Subjective:   Patient ID: [name] male   DOB: 01/02/1966 47 y.o.   MRN: [id number]  HPI: [name] is a 47 y.o. with past medical history listed below comes in for followup visit. No complaints today.  Please see the A&P for the status of the pt's chronic medical problems.    Past Medical History  Diagnosis Date  . Onychomycosis   . Hypertension   . Diabetic peripheral neuropathy   . Diabetic foot ulcer 12/09    left plantar area  . History of drug abuse     Cocaine and marijuana  . Exposure to trichomonas     treated empirically  . Cellulitis of left foot 03/2008    left 4th and 5th metatarsal area  . Hyperlipidemia   . 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 Ulcerative esophagitis 2007    severe, complicated with UGI bleed  . Mallory Janann August tear April 2009  . History of prolonged Q-T interval on ECG   . 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  . 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 Chronic renal insufficiency   . Depression   . Peripheral vascular disease   . Type II diabetes mellitus   . GERD (gastroesophageal reflux disease)    Current Outpatient Prescriptions  Medication Sig Dispense Refill  . ACCU-CHEK FASTCLIX LANCETS MISC 1 each by Does not apply route daily before breakfast.  102 each  2  . FLUoxetine (PROZAC) 10 MG capsule Take 1 capsule (10 mg total) by mouth daily.  90 capsule  0  . gabapentin (NEURONTIN) 300 MG capsule Take 1 capsule (300 mg total) by mouth 3 (three) times daily.  90 capsule  2  . glucose blood (ACCU-CHEK SMARTVIEW) test strip Use as instructed  100 each  12  . insulin glargine (LANTUS) 100 UNIT/ML injection Inject 25 units before bedtime  10 mL  3  . lisinopril (PRINIVIL,ZESTRIL) 20 MG tablet Take 2 tablets (40 mg  total) by mouth daily.  90 tablet  0  . pantoprazole (PROTONIX) 40 MG tablet Take 1 tablet (40 mg total) by mouth daily.  90 tablet  11  . atorvastatin (LIPITOR) 20 MG tablet Take 1 tablet (20 mg total) by mouth daily.  30 tablet  0   No current facility-administered medications for this visit.   Family History  Problem Relation Age of Onset  . Diabetes type II Mother   . Pancreatic cancer Father    History   Social History  . Marital Status: Married    Spouse Name: N/A    Number of Children: N/A  . Years of Education: N/A   Social History Main Topics  . Smoking status: Current Some Day Smoker -- 0.50 packs/day for 34 years    Types: Cigarettes  . Smokeless tobacco: Never Used     Comment: Has stopped 2 weeks ago- QUIT line info given to pt and wife for asst with smoking patchers, etc  . Alcohol Use: Yes     Comment: 12/05/2011 "occasionally; maybe New Years"  . Drug Use: Yes    Special: Marijuana     Comment: 12/05/2011 "last time ~ 2 wk ago"  . Sexually Active: Yes   Other Topics Concern  . None  Social History Narrative  . None   Review of Systems: Constitutional: Denies fever, chills, diaphoresis, appetite change and fatigue.  HEENT: Denies photophobia, eye pain, redness, hearing loss, ear pain, congestion, sore throat, rhinorrhea, sneezing, mouth sores, trouble swallowing, neck pain, neck stiffness and tinnitus.   Respiratory: Denies SOB, DOE, cough, chest tightness,  and wheezing.   Cardiovascular: Denies chest pain, palpitations and leg swelling.  Gastrointestinal: Denies nausea, vomiting, abdominal pain, diarrhea, constipation, blood in stool and abdominal distention.  Genitourinary: Denies dysuria, urgency, frequency, hematuria, flank pain and difficulty urinating.  Musculoskeletal: Denies myalgias, back pain, joint swelling, arthralgias and gait problem.  Skin: Denies pallor, rash. He believes that his wounds on both feet and improving with the treatment received  with the wound care center. He attends the wound care center every Friday. Neurological: Denies dizziness, seizures, syncope, weakness, light-headedness, numbness and headaches.  Hematological: Denies adenopathy. Easy bruising, personal or family bleeding history  Psychiatric/Behavioral: Denies suicidal ideation, mood changes, confusion, nervousness, sleep disturbance and agitation. He reports that his symptoms of depression have significantly improved since starting Prozac 10 mg once daily  Objective:  Physical Exam: Filed Vitals:   07/03/12 1432 07/03/12 1506 07/03/12 1508 07/03/12 1510  BP: 151/92 130/80  130/80  Pulse: 106  80   Temp: 97.8 F (36.6 C)     TempSrc: Oral     Height: 5\' 8"  (1.727 m)     Weight: 203 lb 11.2 oz (92.398 kg)     SpO2: 100%      Constitutional: Vital signs reviewed.  Patient is a well-developed and well-nourished in no acute distress and cooperative with exam. Alert and oriented x3.  Head: Normocephalic and atraumatic Ear: TM normal bilaterally Mouth: no erythema or exudates, MMM Eyes: PERRL, EOMI, conjunctivae normal, No scleral icterus.  Neck: Supple, Trachea midline normal ROM, No JVD, mass, thyromegaly, or carotid bruit present.  Cardiovascular: RRR, S1 normal, S2 normal, no MRG, pulses symmetric and intact bilaterally Pulmonary/Chest: CTAB, no wheezes, rales, or rhonchi Abdominal: Soft. Non-tender, non-distended, bowel sounds are normal, no masses, organomegaly, or guarding present.  GU: no CVA tenderness Musculoskeletal: No joint deformities, erythema, or stiffness, ROM full and no nontender Hematology: no cervical, inginal, or axillary adenopathy.  Neurological: A&O x3, Strength is normal and symmetric bilaterally, cranial nerve II-XII are grossly intact, no focal motor deficit, sensory intact to light touch bilaterally.  Skin: Warm, dry and intact. No rash, cyanosis, or clubbing.  The wounds, on both feet look clean without active drainage. No  erythema, or swelling.  Psychiatric: Normal mood and affect. speech and behavior is normal. Judgment and thought content normal. Cognition and memory are normal.   Assessment & Plan:  I have discussed my assessment, and plan with Dr. Aundria Rud as detailed under my problem based charting.  In brief, Mr. Sison will have his Lantus insulin, increased from 20 to 25 units once daily for the next one week. If his blood sugars continue to be above 200, his Lantus dose can further be increased to 30 units once daily. I have also referred him to [facility] for assistance for his uncontrolled diabetes. I have increased his lisinopril from 20 mg once daily to 40 mg once daily. I will order a basic metabolic panel for creatinine and potassium today. He will followup in 2 weeks for further insulin adjustment. Have encouraged him to bring his glucose meter with his next visit.

## 2012-07-03 NOTE — Assessment & Plan Note (Signed)
BP Readings from Last 3 Encounters:  07/03/12 130/80  06/21/12 147/95  02/28/12 139/83    Lab Results  Component Value Date   NA 131* 06/21/2012   K 4.4 06/21/2012   CREATININE 1.64* 06/21/2012    Assessment:  Blood pressure control: controlled  Progress toward BP goal:  at goal  Comments: increased lisinopril to 40 mg once daily  Plan:  Medications:  continue current medications  Educational resources provided:    Self management tools provided:    Other plans: Increased Lisinopril to 40 mg once daily. Will do BMET today for K and Creatinine

## 2012-07-03 NOTE — Assessment & Plan Note (Signed)
Patient reports good response with current dose of Prozac 10 mg once daily

## 2012-07-03 NOTE — Assessment & Plan Note (Addendum)
   02/28/2012 Office Visit Written 02/28/2012 8:11 PM by Dow Adolph, MD    Mr. Leaver had another episode of hypoglycemia last night. This has been a recurrent problem with her current insulin of NovoLog.  Plan  -Will reduce his insulin to 15 international units in the morning and 18 international units in the evening.  -Will discontinue carvediol to reduce the risk of masking of hypoglycemic symptoms.  -Once his insurance comes through, Mr. Coston will be switched to Lantus, which low risk of hypoglycemia.       01/03/2012 Office Visit Written 01/03/2012 8:57 PM by Dow Adolph, MD    The patient reports episodes of hypoglycemia with his current insulin regimen of 40 international units in the morning and 36 international units at bedtime. Recently he was seen in the ED with hypoglycemia of 43. He was given IV dextrose and discharged in stable condition. He was also seen in the clinic and his insulin regimen was changed. However, he continues to have similar episodes with reports of today experiencing another episode. I have Reduced his dose of Lantus to 36 units in the morning and 30 units at bedtime. The patient understands the symptoms of hypoglycemia. Have encouraged him to measure his blood sugar regularly and bring his meter to the clinic. Of counseled him about timing of his meals and insulin injection. I have also encouraged him to call the ED if he experiences episodes of hypoglycemia.      Lab Results  Component Value Date   HGBA1C 13.7 06/21/2012   HGBA1C 13.1* 02/18/2012   HGBA1C 11.9* 12/04/2011     Assessment:  Diabetes control: poor control (HgbA1C >9%)  Progress toward A1C goal:  deteriorated  Comments: He has just recently restarted his Lantus after getting medcaid.   Plan:  Medications:  Increased lantus to 25 units at bedtime. I have advised him to continue with this 25 units once daily for the next one week and if his blood sugars continue to be above 200, he can  increase it to 30 units at bedtime. He will follo up in 2 weeks.  Home glucose monitoring:   Frequency: 2 times a day   Timing: before breakfast;before lunch;before dinner  Instruction/counseling given: reminded to bring blood glucose meter & log to each visit, reminded to bring medications to each visit and discussed foot care  Educational resources provided:    Self management tools provided:    Other plans: referred him to [facility]

## 2012-07-03 NOTE — Assessment & Plan Note (Signed)
I have switched him from Pravachol to Lipitor to achieve high intensity statin therapy.

## 2012-07-04 LAB — BASIC METABOLIC PANEL
BUN: 17 mg/dL (ref 6–23)
Chloride: 104 mEq/L (ref 96–112)
Glucose, Bld: 262 mg/dL — ABNORMAL HIGH (ref 70–99)
Potassium: 4.4 mEq/L (ref 3.5–5.3)

## 2012-07-12 ENCOUNTER — Encounter (HOSPITAL_BASED_OUTPATIENT_CLINIC_OR_DEPARTMENT_OTHER): Payer: Medicaid Other | Attending: General Surgery

## 2012-07-12 DIAGNOSIS — L84 Corns and callosities: Secondary | ICD-10-CM | POA: Insufficient documentation

## 2012-07-12 DIAGNOSIS — E1169 Type 2 diabetes mellitus with other specified complication: Secondary | ICD-10-CM | POA: Insufficient documentation

## 2012-07-12 DIAGNOSIS — L97509 Non-pressure chronic ulcer of other part of unspecified foot with unspecified severity: Secondary | ICD-10-CM | POA: Insufficient documentation

## 2012-07-12 DIAGNOSIS — S98119A Complete traumatic amputation of unspecified great toe, initial encounter: Secondary | ICD-10-CM | POA: Insufficient documentation

## 2012-07-17 ENCOUNTER — Ambulatory Visit: Payer: Medicaid Other | Admitting: Internal Medicine

## 2012-07-27 ENCOUNTER — Encounter (HOSPITAL_COMMUNITY): Payer: Self-pay | Admitting: Emergency Medicine

## 2012-07-27 ENCOUNTER — Emergency Department (HOSPITAL_COMMUNITY): Payer: Medicaid Other

## 2012-07-27 ENCOUNTER — Emergency Department (HOSPITAL_COMMUNITY)
Admission: EM | Admit: 2012-07-27 | Discharge: 2012-07-27 | Disposition: A | Discharge status: Home Health | Payer: Medicaid Other | Attending: Emergency Medicine | Admitting: Emergency Medicine

## 2012-07-27 DIAGNOSIS — I129 Hypertensive chronic kidney disease with stage 1 through stage 4 chronic kidney disease, or unspecified chronic kidney disease: Secondary | ICD-10-CM | POA: Insufficient documentation

## 2012-07-27 DIAGNOSIS — Z8619 Personal history of other infectious and parasitic diseases: Secondary | ICD-10-CM | POA: Insufficient documentation

## 2012-07-27 DIAGNOSIS — K208 Other esophagitis without bleeding: Secondary | ICD-10-CM | POA: Insufficient documentation

## 2012-07-27 DIAGNOSIS — F3289 Other specified depressive episodes: Secondary | ICD-10-CM | POA: Insufficient documentation

## 2012-07-27 DIAGNOSIS — E1142 Type 2 diabetes mellitus with diabetic polyneuropathy: Secondary | ICD-10-CM | POA: Insufficient documentation

## 2012-07-27 DIAGNOSIS — N189 Chronic kidney disease, unspecified: Secondary | ICD-10-CM | POA: Insufficient documentation

## 2012-07-27 DIAGNOSIS — R112 Nausea with vomiting, unspecified: Secondary | ICD-10-CM

## 2012-07-27 DIAGNOSIS — E785 Hyperlipidemia, unspecified: Secondary | ICD-10-CM | POA: Insufficient documentation

## 2012-07-27 DIAGNOSIS — F329 Major depressive disorder, single episode, unspecified: Secondary | ICD-10-CM | POA: Insufficient documentation

## 2012-07-27 DIAGNOSIS — Z87448 Personal history of other diseases of urinary system: Secondary | ICD-10-CM | POA: Insufficient documentation

## 2012-07-27 DIAGNOSIS — F17200 Nicotine dependence, unspecified, uncomplicated: Secondary | ICD-10-CM | POA: Insufficient documentation

## 2012-07-27 DIAGNOSIS — E1169 Type 2 diabetes mellitus with other specified complication: Secondary | ICD-10-CM | POA: Insufficient documentation

## 2012-07-27 DIAGNOSIS — L97509 Non-pressure chronic ulcer of other part of unspecified foot with unspecified severity: Secondary | ICD-10-CM | POA: Insufficient documentation

## 2012-07-27 DIAGNOSIS — E1149 Type 2 diabetes mellitus with other diabetic neurological complication: Secondary | ICD-10-CM | POA: Insufficient documentation

## 2012-07-27 DIAGNOSIS — R739 Hyperglycemia, unspecified: Secondary | ICD-10-CM

## 2012-07-27 DIAGNOSIS — Z79899 Other long term (current) drug therapy: Secondary | ICD-10-CM | POA: Insufficient documentation

## 2012-07-27 DIAGNOSIS — K219 Gastro-esophageal reflux disease without esophagitis: Secondary | ICD-10-CM

## 2012-07-27 DIAGNOSIS — Z794 Long term (current) use of insulin: Secondary | ICD-10-CM | POA: Insufficient documentation

## 2012-07-27 DIAGNOSIS — R109 Unspecified abdominal pain: Secondary | ICD-10-CM

## 2012-07-27 DIAGNOSIS — Z8719 Personal history of other diseases of the digestive system: Secondary | ICD-10-CM | POA: Insufficient documentation

## 2012-07-27 DIAGNOSIS — Z8679 Personal history of other diseases of the circulatory system: Secondary | ICD-10-CM | POA: Insufficient documentation

## 2012-07-27 LAB — CBC WITH DIFFERENTIAL/PLATELET
Basophils Absolute: 0 10*3/uL (ref 0.0–0.1)
Basophils Relative: 0 % (ref 0–1)
Eosinophils Absolute: 0.1 10*3/uL (ref 0.0–0.7)
Eosinophils Relative: 0 % (ref 0–5)
HCT: 39.1 % (ref 39.0–52.0)
Hemoglobin: 13.3 g/dL (ref 13.0–17.0)
Lymphocytes Relative: 15 % (ref 12–46)
Lymphs Abs: 1.6 K/uL (ref 0.7–4.0)
MCH: 28.9 pg (ref 26.0–34.0)
MCHC: 34 g/dL (ref 30.0–36.0)
MCV: 84.8 fL (ref 78.0–100.0)
Monocytes Absolute: 0.5 K/uL (ref 0.1–1.0)
Monocytes Relative: 4 % (ref 3–12)
Neutro Abs: 9.1 K/uL — ABNORMAL HIGH (ref 1.7–7.7)
Neutrophils Relative %: 80 % — ABNORMAL HIGH (ref 43–77)
Platelets: 389 10*3/uL (ref 150–400)
RBC: 4.61 MIL/uL (ref 4.22–5.81)
RDW: 13 % (ref 11.5–15.5)
WBC: 11.3 10*3/uL — ABNORMAL HIGH (ref 4.0–10.5)

## 2012-07-27 LAB — RAPID URINE DRUG SCREEN, HOSP PERFORMED
Amphetamines: NOT DETECTED
Barbiturates: NOT DETECTED
Benzodiazepines: NOT DETECTED
Cocaine: NOT DETECTED
Opiates: NOT DETECTED
Tetrahydrocannabinol: POSITIVE — AB

## 2012-07-27 LAB — COMPREHENSIVE METABOLIC PANEL
ALT: 18 U/L (ref 0–53)
AST: 26 U/L (ref 0–37)
Calcium: 11 mg/dL — ABNORMAL HIGH (ref 8.4–10.5)
GFR calc Af Amer: 60 mL/min — ABNORMAL LOW (ref >90)
Sodium: 135 mEq/L (ref 135–145)
Total Protein: 8.9 g/dL — ABNORMAL HIGH (ref 6.0–8.3)

## 2012-07-27 LAB — COMPREHENSIVE METABOLIC PANEL WITH GFR
Albumin: 4.1 g/dL (ref 3.5–5.2)
Alkaline Phosphatase: 135 U/L — ABNORMAL HIGH (ref 39–117)
BUN: 20 mg/dL (ref 6–23)
CO2: 27 meq/L (ref 19–32)
Chloride: 92 meq/L — ABNORMAL LOW (ref 96–112)
Creatinine, Ser: 1.55 mg/dL — ABNORMAL HIGH (ref 0.50–1.35)
GFR calc non Af Amer: 52 mL/min — ABNORMAL LOW (ref >90)
Glucose, Bld: 305 mg/dL — ABNORMAL HIGH (ref 70–99)
Potassium: 3.9 meq/L (ref 3.5–5.1)
Total Bilirubin: 0.5 mg/dL (ref 0.3–1.2)

## 2012-07-27 LAB — POCT I-STAT TROPONIN I
Troponin i, poc: 0.01 ng/mL (ref 0.00–0.08)
Troponin i, poc: 0 ng/mL (ref 0.00–0.08)

## 2012-07-27 LAB — URINALYSIS, ROUTINE W REFLEX MICROSCOPIC
Bilirubin Urine: NEGATIVE
Glucose, UA: 500 mg/dL — AB
Ketones, ur: 40 mg/dL — AB
Leukocytes, UA: NEGATIVE
Nitrite: NEGATIVE
Protein, ur: 100 mg/dL — AB
Specific Gravity, Urine: 1.026 (ref 1.005–1.030)
Urobilinogen, UA: 1 mg/dL (ref 0.0–1.0)
pH: 8 (ref 5.0–8.0)

## 2012-07-27 LAB — URINE MICROSCOPIC-ADD ON

## 2012-07-27 LAB — GLUCOSE, CAPILLARY: Glucose-Capillary: 291 mg/dL — ABNORMAL HIGH (ref 70–99)

## 2012-07-27 LAB — LIPASE, BLOOD: Lipase: 31 U/L (ref 11–59)

## 2012-07-27 MED ORDER — SODIUM CHLORIDE 0.9 % IV BOLUS (SEPSIS)
1000.0000 mL | Freq: Once | INTRAVENOUS | Status: AC
  Administered 2012-07-27: 1000 mL via INTRAVENOUS

## 2012-07-27 MED ORDER — PANTOPRAZOLE SODIUM 40 MG IV SOLR
40.0000 mg | Freq: Once | INTRAVENOUS | Status: AC
  Administered 2012-07-27: 40 mg via INTRAVENOUS
  Filled 2012-07-27: qty 40

## 2012-07-27 MED ORDER — FENTANYL CITRATE 0.05 MG/ML IJ SOLN
25.0000 ug | Freq: Once | INTRAMUSCULAR | Status: AC
  Administered 2012-07-27: 25 ug via INTRAVENOUS
  Filled 2012-07-27: qty 2

## 2012-07-27 MED ORDER — ONDANSETRON HCL 4 MG/2ML IJ SOLN
4.0000 mg | Freq: Once | INTRAMUSCULAR | Status: AC
  Administered 2012-07-27: 4 mg via INTRAVENOUS
  Filled 2012-07-27: qty 2

## 2012-07-27 MED ORDER — GI COCKTAIL ~~LOC~~
30.0000 mL | Freq: Once | ORAL | Status: AC
  Administered 2012-07-27: 30 mL via ORAL
  Filled 2012-07-27: qty 30

## 2012-07-27 MED ORDER — HYDROCODONE-ACETAMINOPHEN 5-325 MG PO TABS: ORAL_TABLET | ORAL | Status: DC

## 2012-07-27 MED ORDER — PROMETHAZINE HCL 25 MG PO TABS: 25.0000 mg | ORAL_TABLET | Freq: Four times a day (QID) | ORAL | Status: DC | PRN

## 2012-07-27 MED ORDER — INSULIN ASPART 100 UNIT/ML ~~LOC~~ SOLN
8.0000 [IU] | Freq: Once | SUBCUTANEOUS | Status: AC
  Administered 2012-07-27: 8 [IU] via SUBCUTANEOUS
  Filled 2012-07-27: qty 1

## 2012-07-27 MED ORDER — PANTOPRAZOLE SODIUM 40 MG PO TBEC: 40.0000 mg | DELAYED_RELEASE_TABLET | Freq: Every day | ORAL | Status: DC

## 2012-07-27 MED ORDER — FENTANYL CITRATE 0.05 MG/ML IJ SOLN
50.0000 ug | Freq: Once | INTRAMUSCULAR | Status: AC
  Administered 2012-07-27: 50 ug via INTRAVENOUS
  Filled 2012-07-27: qty 2

## 2012-07-27 NOTE — ED Notes (Signed)
Pt tolerating PO fluids well, states "my stomach still burns". EDP aware

## 2012-07-27 NOTE — ED Notes (Signed)
Pt discharged to home with family. NAD.  

## 2012-07-27 NOTE — ED Notes (Signed)
Patient transported to Ultrasound 

## 2012-07-27 NOTE — ED Notes (Signed)
Pt given beverage 

## 2012-07-27 NOTE — ED Notes (Addendum)
Pt to ED via [facility] with c/o nausea and vomiting.  Also c/o upper abd pain.  Pt has hx of GERD.  St's he has not had his medications because he just got out of jail  EMS gave Zofran 4mg  IM PTA

## 2012-07-27 NOTE — ED Provider Notes (Signed)
History     CSN: [id number]  Arrival date & time 07/27/12  0022   First MD Initiated Contact with Patient 07/27/12 0122      Chief Complaint  Patient presents with  . Emesis    (Consider location/radiation/quality/duration/timing/severity/associated sxs/prior treatment) HPI Comments: Level 5 caveat due to patient condition. Patient presents by EMS due to complaints of upper bowel pain nausea and vomiting which he attributes to vomiting. He reports he has vomited a proximally 6 times today. He reports he is a diabetic on insulin and follows up with outpatient clinics. Patient reports he got of jail 2 days ago and they did not give him any insulin. Patient also takes medications for acid reflux and has not taken any of his medications either. He denies any obvious sick contacts. He denies chest pain, sweats, shortness of breath. He reports upper abdominal pain doesn't radiate into his back or his chest. He denies symptoms of hematuria, flank pain or dysuria. EMS gave him some IV Zofran and he reports his nausea is somewhat improved. Upper epigastric discomfort is rated at moderate intensity. He denies prior history of coronary disease. He reports no recent drinking or drug use. Review of [facility] records indicates the patient has a prior history of alcoholic pancreatitis and prior history of marijuana and cocaine abuse.  Patient is a 47 y.o. male presenting with vomiting. The history is provided by the patient.  Emesis   Past Medical History  Diagnosis Date  . Onychomycosis   . Hypertension   . Diabetic peripheral neuropathy   . Diabetic foot ulcer 12/09    left plantar area  . History of drug abuse     Cocaine and marijuana  . Exposure to trichomonas     treated empirically  . Cellulitis of left foot 03/2008    left 4th and 5th metatarsal area  . Hyperlipidemia   . 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 Ulcerative esophagitis 2007    severe, complicated with UGI bleed  . Mallory Janann August  tear April 2009  . History of prolonged Q-T interval on ECG   . 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  . 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 Chronic renal insufficiency   . Depression   . Peripheral vascular disease   . Type II diabetes mellitus   . GERD (gastroesophageal reflux disease)     Past Surgical History  Procedure Laterality Date  . Toe amputation  06/2011    left; 4th toe  . Nephrectomy      L side  . I&d extremity  08/24/2011    Procedure: IRRIGATION AND DEBRIDEMENT EXTREMITY;  Surgeon: Cammy Copa, MD;  Location: [facility] OR;  Service: Orthopedics;  Laterality: Left;  . Amputation  08/24/2011    Procedure: AMPUTATION RAY;  Surgeon: Cammy Copa, MD;  Location: [facility] OR;  Service: Orthopedics;  Laterality: Left;  left fourth toe Ray Resection  . Amputation  12/05/2011    Procedure: AMPUTATION RAY;  Surgeon: Cammy Copa, MD;  Location: [facility] OR;  Service: Orthopedics;  Laterality: Left;  . I&d extremity  02/18/2012    Procedure: IRRIGATION AND DEBRIDEMENT EXTREMITY;  Surgeon: Kathryne Hitch, MD;  Location: [facility] OR;  Service: Orthopedics;  Laterality: Left;  . Amputation  02/18/2012    Procedure: AMPUTATION DIGIT;  Surgeon: Kathryne Hitch, MD;  Location: [facility]  OR;  Service: Orthopedics;  Laterality: Left;  revison transmetatarsal    Family History  Problem Relation Age of Onset  . Diabetes type II Mother   . Pancreatic cancer Father     History  Substance Use Topics  . Smoking status: Current Some Day Smoker -- 0.50 packs/day for 34 years    Types: Cigarettes  . Smokeless tobacco: Never Used     Comment: Has stopped 2 weeks ago- QUIT line info given to pt and wife for asst with smoking patchers, etc  . Alcohol Use: Yes     Comment: 12/05/2011 "occasionally; maybe New Years"      Review of Systems   Unable to perform ROS: Acuity of condition  Gastrointestinal: Positive for vomiting.    Allergies  Review of patient's allergies indicates no known allergies.  Home Medications   Current Outpatient Rx  Name  Route  Sig  Dispense  Refill  . ACCU-CHEK FASTCLIX LANCETS MISC   Does not apply   1 each by Does not apply route daily before breakfast.   102 each   2   . calcium carbonate (TUMS - DOSED IN MG ELEMENTAL CALCIUM) 500 MG chewable tablet   Oral   Chew 2 tablets by mouth daily as needed for heartburn.         Marland Kitchen FLUoxetine (PROZAC) 10 MG capsule   Oral   Take 1 capsule (10 mg total) by mouth daily.   90 capsule   0   . gabapentin (NEURONTIN) 300 MG capsule   Oral   Take 1 capsule (300 mg total) by mouth 3 (three) times daily.   90 capsule   2   . glucose blood (ACCU-CHEK SMARTVIEW) test strip      Use as instructed   100 each   12     Check daily before breakfast   . insulin glargine (LANTUS) 100 UNIT/ML injection      Inject 25 units before bedtime   10 mL   3   . lisinopril (PRINIVIL,ZESTRIL) 20 MG tablet   Oral   Take 2 tablets (40 mg total) by mouth daily.   90 tablet   0   . pantoprazole (PROTONIX) 40 MG tablet   Oral   Take 1 tablet (40 mg total) by mouth daily.   90 tablet   11   . atorvastatin (LIPITOR) 20 MG tablet   Oral   Take 1 tablet (20 mg total) by mouth daily.   30 tablet   0     BP 175/92  Pulse 74  Temp(Src) 97.9 F (36.6 C) (Oral)  Resp 17  SpO2 100%  Physical Exam  Nursing note and vitals reviewed. Constitutional: He appears well-developed and well-nourished. He appears listless. He is cooperative. He has a sickly appearance.  Clammy in appearance without frank diaphoresis  HENT:  Head: Normocephalic and atraumatic.  Eyes: Conjunctivae and EOM are normal. No scleral icterus.  Neck: Normal range of motion. Neck supple.  Cardiovascular: Normal rate and regular rhythm.   No murmur heard. Pulmonary/Chest: Effort  normal. No respiratory distress. He has no wheezes. He has no rales.  Abdominal: Soft. He exhibits no distension. There is tenderness. There is no rebound and no guarding.  Musculoskeletal: He exhibits no edema.  The patient is status post amputation of toes, no erythema or discharge noted  Neurological: He appears listless.  Skin: Skin is warm and dry. He is not diaphoretic.    ED Course  Procedures (including  critical care time)  Labs Reviewed  CBC WITH DIFFERENTIAL - Abnormal; Notable for the following:    WBC 11.3 (*)    Neutrophils Relative 80 (*)    Neutro Abs 9.1 (*)    All other components within normal limits  COMPREHENSIVE METABOLIC PANEL - Abnormal; Notable for the following:    Chloride 92 (*)    Glucose, Bld 305 (*)    Creatinine, Ser 1.55 (*)    Calcium 11.0 (*)    Total Protein 8.9 (*)    Alkaline Phosphatase 135 (*)    GFR calc non Af Amer 52 (*)    GFR calc Af Amer 60 (*)    All other components within normal limits  URINE RAPID DRUG SCREEN (HOSP PERFORMED) - Abnormal; Notable for the following:    Tetrahydrocannabinol POSITIVE (*)    All other components within normal limits  URINALYSIS, ROUTINE W REFLEX MICROSCOPIC - Abnormal; Notable for the following:    Glucose, UA 500 (*)    Hgb urine dipstick SMALL (*)    Ketones, ur 40 (*)    Protein, ur 100 (*)    All other components within normal limits  GLUCOSE, CAPILLARY - Abnormal; Notable for the following:    Glucose-Capillary 291 (*)    All other components within normal limits  LIPASE, BLOOD  URINE MICROSCOPIC-ADD ON  POCT I-STAT TROPONIN I   No results found.   1. Abdominal pain   2. Nausea and vomiting   3. Hyperglycemia   4. GERD (gastroesophageal reflux disease)     Reverse saturation is 100% and I interpret this to be normal    ECG at time 02:35, shows sinus rhythm at a rate of 73, left axis deviation, left anterior fascicular block. Early RS transition noted between V1 to V2. No  significant change compared to EKG from 02/18/2012. Abn ECG.   4:47 AM Overall, pt feels somewhat improved, still with burning in upper epigastrium.  Abd remains soft, no guarding.  Will try GI cocktail, glucose improved.     6:19 AM No relief from GI cocktail.  Will give additional analgesic, repeat troponin and obtain abd U/S to assess for biliary disease.  PT now tells me he does have insulin at home, just not protonix at home.  If U/S and troponin is neg, pt can be discharged with Rx for protonix and a few days of analgesics.   MDM   Patient is somewhat listless in appearance here. Given his hypertensive state, concern for hypertensive emergency is present. His glucose is only elevated at 305 without a significant low bicarbonate although anion gap is slightly elevated at 16. Patient has mild renal insufficiency but appears at baseline. His white count is minimally elevated which is nonspecific. Given his prior history of drug abuse, although he denies, withdrawal from cocaine may explain his listless behavior. However he is alert and oriented x3 and moving all 4 extremities appropriately and therefore do not suspect an acute CVA. Plan is to give IV fluids, subcutaneous insulin, will check a drug screen and continue to monitor here for improvement. We'll also consider an EKG and a troponin.         [name]. Oletta Lamas, MD 07/27/12 [phone]

## 2012-07-27 NOTE — ED Notes (Signed)
Pt transported to Xray. 

## 2012-08-02 ENCOUNTER — Other Ambulatory Visit (HOSPITAL_BASED_OUTPATIENT_CLINIC_OR_DEPARTMENT_OTHER): Payer: Self-pay | Admitting: General Surgery

## 2012-08-02 ENCOUNTER — Ambulatory Visit (HOSPITAL_COMMUNITY)
Admission: RE | Admit: 2012-08-02 | Discharge: 2012-08-02 | Disposition: A | Discharge status: Home / Self Care | Payer: Medicaid Other | Source: Ambulatory Visit | Attending: General Surgery | Admitting: General Surgery

## 2012-08-02 DIAGNOSIS — R05 Cough: Secondary | ICD-10-CM | POA: Insufficient documentation

## 2012-08-02 DIAGNOSIS — R059 Cough, unspecified: Secondary | ICD-10-CM | POA: Insufficient documentation

## 2012-08-02 DIAGNOSIS — S8990XA Unspecified injury of unspecified lower leg, initial encounter: Secondary | ICD-10-CM | POA: Insufficient documentation

## 2012-08-02 DIAGNOSIS — L98491 Non-pressure chronic ulcer of skin of other sites limited to breakdown of skin: Secondary | ICD-10-CM

## 2012-08-02 DIAGNOSIS — E119 Type 2 diabetes mellitus without complications: Secondary | ICD-10-CM | POA: Insufficient documentation

## 2012-08-02 DIAGNOSIS — X58XXXA Exposure to other specified factors, initial encounter: Secondary | ICD-10-CM | POA: Insufficient documentation

## 2012-08-09 ENCOUNTER — Encounter (HOSPITAL_BASED_OUTPATIENT_CLINIC_OR_DEPARTMENT_OTHER): Payer: Medicaid Other | Attending: General Surgery

## 2012-08-09 DIAGNOSIS — L97509 Non-pressure chronic ulcer of other part of unspecified foot with unspecified severity: Secondary | ICD-10-CM | POA: Insufficient documentation

## 2012-08-09 DIAGNOSIS — E1169 Type 2 diabetes mellitus with other specified complication: Secondary | ICD-10-CM | POA: Insufficient documentation

## 2012-08-09 DIAGNOSIS — I739 Peripheral vascular disease, unspecified: Secondary | ICD-10-CM | POA: Insufficient documentation

## 2012-08-15 ENCOUNTER — Other Ambulatory Visit (HOSPITAL_COMMUNITY): Payer: Self-pay | Admitting: General Surgery

## 2012-08-15 DIAGNOSIS — Z09 Encounter for follow-up examination after completed treatment for conditions other than malignant neoplasm: Secondary | ICD-10-CM

## 2012-08-22 ENCOUNTER — Ambulatory Visit (HOSPITAL_COMMUNITY)
Admission: RE | Admit: 2012-08-22 | Discharge: 2012-08-22 | Disposition: A | Discharge status: Home / Self Care | Payer: Medicaid Other | Source: Ambulatory Visit | Attending: Cardiovascular Disease | Admitting: Cardiovascular Disease

## 2012-08-22 DIAGNOSIS — L97509 Non-pressure chronic ulcer of other part of unspecified foot with unspecified severity: Secondary | ICD-10-CM

## 2012-08-22 DIAGNOSIS — Z09 Encounter for follow-up examination after completed treatment for conditions other than malignant neoplasm: Secondary | ICD-10-CM | POA: Insufficient documentation

## 2012-08-22 DIAGNOSIS — I739 Peripheral vascular disease, unspecified: Secondary | ICD-10-CM

## 2012-08-22 NOTE — Progress Notes (Signed)
Lower Extremity Arterial Duplex Completed. Negative for any arterial occlusive disease. Erlene Quan

## 2012-08-28 ENCOUNTER — Encounter (HOSPITAL_COMMUNITY): Payer: Self-pay | Admitting: Anesthesiology

## 2012-08-28 ENCOUNTER — Emergency Department (HOSPITAL_COMMUNITY): Payer: Medicaid Other

## 2012-08-28 ENCOUNTER — Encounter (HOSPITAL_COMMUNITY)
Admission: EM | Disposition: A | Discharge status: Home / Self Care | Payer: Self-pay | Source: Home / Self Care | Attending: Internal Medicine

## 2012-08-28 ENCOUNTER — Inpatient Hospital Stay (HOSPITAL_COMMUNITY): Payer: Medicaid Other | Admitting: Anesthesiology

## 2012-08-28 ENCOUNTER — Encounter (HOSPITAL_COMMUNITY): Payer: Self-pay | Admitting: Family Medicine

## 2012-08-28 ENCOUNTER — Inpatient Hospital Stay (HOSPITAL_COMMUNITY)
Admission: EM | Admit: 2012-08-28 | Discharge: 2012-09-03 | DRG: 574 | Disposition: A | Discharge status: Home / Self Care | Payer: Medicaid Other | Attending: Internal Medicine | Admitting: Internal Medicine

## 2012-08-28 DIAGNOSIS — Z8711 Personal history of peptic ulcer disease: Secondary | ICD-10-CM

## 2012-08-28 DIAGNOSIS — R059 Cough, unspecified: Secondary | ICD-10-CM | POA: Diagnosis present

## 2012-08-28 DIAGNOSIS — E1142 Type 2 diabetes mellitus with diabetic polyneuropathy: Secondary | ICD-10-CM | POA: Diagnosis present

## 2012-08-28 DIAGNOSIS — R197 Diarrhea, unspecified: Secondary | ICD-10-CM | POA: Diagnosis present

## 2012-08-28 DIAGNOSIS — B351 Tinea unguium: Secondary | ICD-10-CM | POA: Diagnosis present

## 2012-08-28 DIAGNOSIS — J209 Acute bronchitis, unspecified: Secondary | ICD-10-CM | POA: Diagnosis present

## 2012-08-28 DIAGNOSIS — F17200 Nicotine dependence, unspecified, uncomplicated: Secondary | ICD-10-CM

## 2012-08-28 DIAGNOSIS — Z905 Acquired absence of kidney: Secondary | ICD-10-CM

## 2012-08-28 DIAGNOSIS — B954 Other streptococcus as the cause of diseases classified elsewhere: Secondary | ICD-10-CM | POA: Diagnosis present

## 2012-08-28 DIAGNOSIS — IMO0002 Reserved for concepts with insufficient information to code with codable children: Secondary | ICD-10-CM | POA: Diagnosis present

## 2012-08-28 DIAGNOSIS — L97509 Non-pressure chronic ulcer of other part of unspecified foot with unspecified severity: Secondary | ICD-10-CM | POA: Diagnosis present

## 2012-08-28 DIAGNOSIS — F3289 Other specified depressive episodes: Secondary | ICD-10-CM | POA: Diagnosis present

## 2012-08-28 DIAGNOSIS — N058 Unspecified nephritic syndrome with other morphologic changes: Secondary | ICD-10-CM | POA: Diagnosis present

## 2012-08-28 DIAGNOSIS — L02512 Cutaneous abscess of left hand: Secondary | ICD-10-CM

## 2012-08-28 DIAGNOSIS — M869 Osteomyelitis, unspecified: Secondary | ICD-10-CM | POA: Diagnosis present

## 2012-08-28 DIAGNOSIS — E1165 Type 2 diabetes mellitus with hyperglycemia: Secondary | ICD-10-CM | POA: Diagnosis present

## 2012-08-28 DIAGNOSIS — Z794 Long term (current) use of insulin: Secondary | ICD-10-CM

## 2012-08-28 DIAGNOSIS — K219 Gastro-esophageal reflux disease without esophagitis: Secondary | ICD-10-CM | POA: Diagnosis present

## 2012-08-28 DIAGNOSIS — E785 Hyperlipidemia, unspecified: Secondary | ICD-10-CM | POA: Diagnosis present

## 2012-08-28 DIAGNOSIS — M1990 Unspecified osteoarthritis, unspecified site: Secondary | ICD-10-CM | POA: Diagnosis present

## 2012-08-28 DIAGNOSIS — A419 Sepsis, unspecified organism: Secondary | ICD-10-CM | POA: Diagnosis present

## 2012-08-28 DIAGNOSIS — N182 Chronic kidney disease, stage 2 (mild): Secondary | ICD-10-CM

## 2012-08-28 DIAGNOSIS — L02511 Cutaneous abscess of right hand: Secondary | ICD-10-CM

## 2012-08-28 DIAGNOSIS — E1149 Type 2 diabetes mellitus with other diabetic neurological complication: Secondary | ICD-10-CM | POA: Diagnosis present

## 2012-08-28 DIAGNOSIS — E11621 Type 2 diabetes mellitus with foot ulcer: Secondary | ICD-10-CM | POA: Diagnosis present

## 2012-08-28 DIAGNOSIS — I10 Essential (primary) hypertension: Secondary | ICD-10-CM | POA: Diagnosis present

## 2012-08-28 DIAGNOSIS — E871 Hypo-osmolality and hyponatremia: Secondary | ICD-10-CM | POA: Diagnosis not present

## 2012-08-28 DIAGNOSIS — M86679 Other chronic osteomyelitis, unspecified ankle and foot: Secondary | ICD-10-CM | POA: Diagnosis present

## 2012-08-28 DIAGNOSIS — E1129 Type 2 diabetes mellitus with other diabetic kidney complication: Secondary | ICD-10-CM | POA: Diagnosis present

## 2012-08-28 DIAGNOSIS — K3189 Other diseases of stomach and duodenum: Secondary | ICD-10-CM | POA: Diagnosis not present

## 2012-08-28 DIAGNOSIS — T8789 Other complications of amputation stump: Secondary | ICD-10-CM | POA: Diagnosis present

## 2012-08-28 DIAGNOSIS — E1159 Type 2 diabetes mellitus with other circulatory complications: Secondary | ICD-10-CM

## 2012-08-28 DIAGNOSIS — L02619 Cutaneous abscess of unspecified foot: Secondary | ICD-10-CM | POA: Diagnosis present

## 2012-08-28 DIAGNOSIS — L02519 Cutaneous abscess of unspecified hand: Principal | ICD-10-CM | POA: Diagnosis present

## 2012-08-28 DIAGNOSIS — F329 Major depressive disorder, single episode, unspecified: Secondary | ICD-10-CM | POA: Diagnosis present

## 2012-08-28 DIAGNOSIS — Y835 Amputation of limb(s) as the cause of abnormal reaction of the patient, or of later complication, without mention of misadventure at the time of the procedure: Secondary | ICD-10-CM | POA: Diagnosis present

## 2012-08-28 DIAGNOSIS — M9080 Osteopathy in diseases classified elsewhere, unspecified site: Secondary | ICD-10-CM | POA: Diagnosis present

## 2012-08-28 DIAGNOSIS — L03039 Cellulitis of unspecified toe: Secondary | ICD-10-CM | POA: Diagnosis present

## 2012-08-28 DIAGNOSIS — R05 Cough: Secondary | ICD-10-CM | POA: Diagnosis present

## 2012-08-28 HISTORY — DX: Chronic kidney disease, stage 2 (mild): N18.2

## 2012-08-28 HISTORY — DX: Type 2 diabetes mellitus with other diabetic kidney complication: E11.29

## 2012-08-28 HISTORY — DX: Reserved for concepts with insufficient information to code with codable children: IMO0002

## 2012-08-28 HISTORY — DX: Type 2 diabetes mellitus with hyperglycemia: E11.65

## 2012-08-28 HISTORY — PX: I & D EXTREMITY: SHX5045

## 2012-08-28 LAB — CBC WITH DIFFERENTIAL/PLATELET
Eosinophils Relative: 1 % (ref 0–5)
HCT: 32.1 % — ABNORMAL LOW (ref 39.0–52.0)
Hemoglobin: 10.9 g/dL — ABNORMAL LOW (ref 13.0–17.0)
Lymphocytes Relative: 6 % — ABNORMAL LOW (ref 12–46)
Lymphs Abs: 1.4 10*3/uL (ref 0.7–4.0)
MCV: 88.2 fL (ref 78.0–100.0)
Monocytes Absolute: 1.1 10*3/uL — ABNORMAL HIGH (ref 0.1–1.0)
RBC: 3.64 MIL/uL — ABNORMAL LOW (ref 4.22–5.81)
WBC: 21.6 10*3/uL — ABNORMAL HIGH (ref 4.0–10.5)

## 2012-08-28 LAB — URINALYSIS, ROUTINE W REFLEX MICROSCOPIC
Leukocytes, UA: NEGATIVE
Nitrite: NEGATIVE
Specific Gravity, Urine: 1.034 — ABNORMAL HIGH (ref 1.005–1.030)
pH: 7 (ref 5.0–8.0)

## 2012-08-28 LAB — GLUCOSE, CAPILLARY: Glucose-Capillary: 317 mg/dL — ABNORMAL HIGH (ref 70–99)

## 2012-08-28 LAB — BASIC METABOLIC PANEL
CO2: 29 mEq/L (ref 19–32)
Calcium: 9.1 mg/dL (ref 8.4–10.5)
Creatinine, Ser: 1.33 mg/dL (ref 0.50–1.35)
Glucose, Bld: 263 mg/dL — ABNORMAL HIGH (ref 70–99)
Sodium: 133 mEq/L — ABNORMAL LOW (ref 135–145)

## 2012-08-28 LAB — LIPASE, BLOOD: Lipase: 14 U/L (ref 11–59)

## 2012-08-28 LAB — HEPATIC FUNCTION PANEL: Total Protein: 7.7 g/dL (ref 6.0–8.3)

## 2012-08-28 LAB — SEDIMENTATION RATE: Sed Rate: 84 mm/hr — ABNORMAL HIGH (ref 0–16)

## 2012-08-28 LAB — URINE MICROSCOPIC-ADD ON

## 2012-08-28 SURGERY — IRRIGATION AND DEBRIDEMENT EXTREMITY
Anesthesia: General | Site: Hand | Laterality: Left | Wound class: Dirty or Infected

## 2012-08-28 MED ORDER — FENTANYL CITRATE 0.05 MG/ML IJ SOLN
INTRAMUSCULAR | Status: DC | PRN
  Administered 2012-08-28: 50 ug via INTRAVENOUS
  Administered 2012-08-28 (×2): 100 ug via INTRAVENOUS

## 2012-08-28 MED ORDER — MORPHINE SULFATE 2 MG/ML IJ SOLN
1.0000 mg | INTRAMUSCULAR | Status: DC | PRN
  Administered 2012-08-28: 1 mg via INTRAVENOUS
  Filled 2012-08-28: qty 1

## 2012-08-28 MED ORDER — HYDROMORPHONE HCL PF 1 MG/ML IJ SOLN
INTRAMUSCULAR | Status: AC
  Administered 2012-08-30: 0.5 mg via INTRAVENOUS
  Filled 2012-08-28: qty 1

## 2012-08-28 MED ORDER — ONDANSETRON HCL 4 MG PO TABS: 4.0000 mg | ORAL_TABLET | Freq: Four times a day (QID) | ORAL | Status: DC | PRN

## 2012-08-28 MED ORDER — NEOSTIGMINE METHYLSULFATE 1 MG/ML IJ SOLN
INTRAMUSCULAR | Status: DC | PRN
  Administered 2012-08-28: 3.5 mg via INTRAVENOUS

## 2012-08-28 MED ORDER — OXYCODONE HCL 5 MG PO TABS: 5.0000 mg | ORAL_TABLET | Freq: Once | ORAL | Status: DC | PRN

## 2012-08-28 MED ORDER — OXYCODONE-ACETAMINOPHEN 5-325 MG PO TABS
1.0000 | ORAL_TABLET | Freq: Once | ORAL | Status: AC
  Administered 2012-08-28: 1 via ORAL
  Filled 2012-08-28: qty 1

## 2012-08-28 MED ORDER — LIDOCAINE HCL (CARDIAC) 20 MG/ML IV SOLN
INTRAVENOUS | Status: DC | PRN
  Administered 2012-08-28: 100 mg via INTRAVENOUS

## 2012-08-28 MED ORDER — CLINDAMYCIN PHOSPHATE 900 MG/50ML IV SOLN
900.0000 mg | Freq: Once | INTRAVENOUS | Status: AC
  Administered 2012-08-28: 900 mg via INTRAVENOUS
  Filled 2012-08-28: qty 50

## 2012-08-28 MED ORDER — ONDANSETRON HCL 4 MG/2ML IJ SOLN: 4.0000 mg | Freq: Once | INTRAMUSCULAR | Status: DC | PRN

## 2012-08-28 MED ORDER — PROPOFOL 10 MG/ML IV BOLUS
INTRAVENOUS | Status: DC | PRN
  Administered 2012-08-28: 200 mg via INTRAVENOUS

## 2012-08-28 MED ORDER — SODIUM CHLORIDE 0.9 % IV SOLN
INTRAVENOUS | Status: AC
  Administered 2012-08-28: 23:00:00 via INTRAVENOUS

## 2012-08-28 MED ORDER — ROCURONIUM BROMIDE 100 MG/10ML IV SOLN
INTRAVENOUS | Status: DC | PRN
  Administered 2012-08-28: 20 mg via INTRAVENOUS

## 2012-08-28 MED ORDER — ONDANSETRON HCL 4 MG/2ML IJ SOLN
INTRAMUSCULAR | Status: DC | PRN
  Administered 2012-08-28: 4 mg via INTRAVENOUS

## 2012-08-28 MED ORDER — SODIUM CHLORIDE 0.9 % IJ SOLN
3.0000 mL | Freq: Two times a day (BID) | INTRAMUSCULAR | Status: DC
  Administered 2012-08-29 – 2012-09-02 (×9): 3 mL via INTRAVENOUS

## 2012-08-28 MED ORDER — LACTATED RINGERS IV SOLN
INTRAVENOUS | Status: DC | PRN
  Administered 2012-08-28: 21:00:00 via INTRAVENOUS

## 2012-08-28 MED ORDER — ONDANSETRON HCL 4 MG/2ML IJ SOLN: 4.0000 mg | Freq: Four times a day (QID) | INTRAMUSCULAR | Status: DC | PRN

## 2012-08-28 MED ORDER — SODIUM CHLORIDE 0.9 % IR SOLN
Status: DC | PRN
  Administered 2012-08-28: 3000 mL
  Administered 2012-08-28: 1000 mL

## 2012-08-28 MED ORDER — HYDROMORPHONE HCL PF 1 MG/ML IJ SOLN
0.2500 mg | INTRAMUSCULAR | Status: DC | PRN
  Administered 2012-08-28 (×2): 0.5 mg via INTRAVENOUS

## 2012-08-28 MED ORDER — INSULIN GLARGINE 100 UNIT/ML ~~LOC~~ SOLN
15.0000 [IU] | Freq: Every day | SUBCUTANEOUS | Status: DC
  Administered 2012-08-29: 15 [IU] via SUBCUTANEOUS
  Filled 2012-08-28 (×2): qty 0.15

## 2012-08-28 MED ORDER — MIDAZOLAM HCL 5 MG/5ML IJ SOLN
INTRAMUSCULAR | Status: DC | PRN
  Administered 2012-08-28: 2 mg via INTRAVENOUS

## 2012-08-28 MED ORDER — GLYCOPYRROLATE 0.2 MG/ML IJ SOLN
INTRAMUSCULAR | Status: DC | PRN
  Administered 2012-08-28: .4 mg via INTRAVENOUS

## 2012-08-28 MED ORDER — HEPARIN SODIUM (PORCINE) 5000 UNIT/ML IJ SOLN
5000.0000 [IU] | Freq: Three times a day (TID) | INTRAMUSCULAR | Status: DC
  Administered 2012-08-30 – 2012-09-03 (×9): 5000 [IU] via SUBCUTANEOUS
  Filled 2012-08-28 (×19): qty 1

## 2012-08-28 MED ORDER — OXYCODONE HCL 5 MG/5ML PO SOLN: 5.0000 mg | Freq: Once | ORAL | Status: DC | PRN

## 2012-08-28 MED ORDER — SUCCINYLCHOLINE CHLORIDE 20 MG/ML IJ SOLN
INTRAMUSCULAR | Status: DC | PRN
  Administered 2012-08-28: 120 mg via INTRAVENOUS

## 2012-08-28 MED ORDER — MEPERIDINE HCL 25 MG/ML IJ SOLN: 6.2500 mg | INTRAMUSCULAR | Status: DC | PRN

## 2012-08-28 SURGICAL SUPPLY — 60 items
BANDAGE CONFORM 2  STR LF (GAUZE/BANDAGES/DRESSINGS) IMPLANT
BANDAGE ELASTIC 3 VELCRO ST LF (GAUZE/BANDAGES/DRESSINGS) ×2 IMPLANT
BANDAGE ELASTIC 4 VELCRO ST LF (GAUZE/BANDAGES/DRESSINGS) ×2 IMPLANT
BANDAGE GAUZE ELAST BULKY 4 IN (GAUZE/BANDAGES/DRESSINGS) ×4 IMPLANT
BNDG CMPR 9X4 STRL LF SNTH (GAUZE/BANDAGES/DRESSINGS)
BNDG COHESIVE 1X5 TAN STRL LF (GAUZE/BANDAGES/DRESSINGS) IMPLANT
BNDG ESMARK 4X9 LF (GAUZE/BANDAGES/DRESSINGS) ×1 IMPLANT
CLOTH BEACON ORANGE TIMEOUT ST (SAFETY) ×2 IMPLANT
CORDS BIPOLAR (ELECTRODE) ×2 IMPLANT
COVER SURGICAL LIGHT HANDLE (MISCELLANEOUS) ×2 IMPLANT
CUFF TOURNIQUET SINGLE 18IN (TOURNIQUET CUFF) ×1 IMPLANT
CUFF TOURNIQUET SINGLE 24IN (TOURNIQUET CUFF) IMPLANT
DRAIN PENROSE 1/4X12 LTX STRL (WOUND CARE) IMPLANT
DRAPE SURG 17X23 STRL (DRAPES) ×1 IMPLANT
DRSG ADAPTIC 3X8 NADH LF (GAUZE/BANDAGES/DRESSINGS) ×2 IMPLANT
DRSG EMULSION OIL 3X3 NADH (GAUZE/BANDAGES/DRESSINGS) ×1 IMPLANT
ELECT REM PT RETURN 9FT ADLT (ELECTROSURGICAL)
ELECTRODE REM PT RTRN 9FT ADLT (ELECTROSURGICAL) IMPLANT
GAUZE XEROFORM 1X8 LF (GAUZE/BANDAGES/DRESSINGS) ×1 IMPLANT
GAUZE XEROFORM 5X9 LF (GAUZE/BANDAGES/DRESSINGS) IMPLANT
GLOVE BIO SURGEON STRL SZ 6.5 (GLOVE) ×2 IMPLANT
GLOVE BIOGEL PI IND STRL 8 (GLOVE) IMPLANT
GLOVE BIOGEL PI IND STRL 8.5 (GLOVE) ×1 IMPLANT
GLOVE BIOGEL PI INDICATOR 8 (GLOVE) ×1
GLOVE BIOGEL PI INDICATOR 8.5 (GLOVE) ×1
GLOVE SURG ORTHO 8.0 STRL STRW (GLOVE) ×2 IMPLANT
GOWN PREVENTION PLUS XLARGE (GOWN DISPOSABLE) ×2 IMPLANT
GOWN STRL NON-REIN LRG LVL3 (GOWN DISPOSABLE) ×5 IMPLANT
HANDPIECE INTERPULSE COAX TIP (DISPOSABLE)
KIT BASIN OR (CUSTOM PROCEDURE TRAY) ×2 IMPLANT
KIT ROOM TURNOVER OR (KITS) ×2 IMPLANT
LOOP VESSEL MAXI BLUE (MISCELLANEOUS) ×1 IMPLANT
MANIFOLD NEPTUNE II (INSTRUMENTS) ×2 IMPLANT
NDL HYPO 25GX1X1/2 BEV (NEEDLE) IMPLANT
NEEDLE HYPO 25GX1X1/2 BEV (NEEDLE) ×2 IMPLANT
NS IRRIG 1000ML POUR BTL (IV SOLUTION) ×2 IMPLANT
PACK ORTHO EXTREMITY (CUSTOM PROCEDURE TRAY) ×2 IMPLANT
PAD ARMBOARD 7.5X6 YLW CONV (MISCELLANEOUS) ×3 IMPLANT
PAD CAST 4YDX4 CTTN HI CHSV (CAST SUPPLIES) ×1 IMPLANT
PADDING CAST COTTON 4X4 STRL (CAST SUPPLIES) ×2
SET CYSTO W/LG BORE CLAMP LF (SET/KITS/TRAYS/PACK) ×1 IMPLANT
SET HNDPC FAN SPRY TIP SCT (DISPOSABLE) IMPLANT
SOAP 2 % CHG 4 OZ (WOUND CARE) ×2 IMPLANT
SPONGE GAUZE 4X4 12PLY (GAUZE/BANDAGES/DRESSINGS) ×3 IMPLANT
SPONGE LAP 18X18 X RAY DECT (DISPOSABLE) ×2 IMPLANT
SPONGE LAP 4X18 X RAY DECT (DISPOSABLE) ×1 IMPLANT
SUCTION FRAZIER TIP 10 FR DISP (SUCTIONS) ×1 IMPLANT
SUT ETHILON 4 0 PS 2 18 (SUTURE) IMPLANT
SUT ETHILON 5 0 P 3 18 (SUTURE) ×1
SUT NYLON ETHILON 5-0 P-3 1X18 (SUTURE) ×1 IMPLANT
SUT PROLENE 4 0 PS 2 18 (SUTURE) ×1 IMPLANT
SWAB COLLECTION DEVICE MRSA (MISCELLANEOUS) ×1 IMPLANT
SYR CONTROL 10ML LL (SYRINGE) ×1 IMPLANT
TOWEL OR 17X24 6PK STRL BLUE (TOWEL DISPOSABLE) ×2 IMPLANT
TOWEL OR 17X26 10 PK STRL BLUE (TOWEL DISPOSABLE) ×2 IMPLANT
TUBE ANAEROBIC SPECIMEN COL (MISCELLANEOUS) IMPLANT
TUBE CONNECTING 12X1/4 (SUCTIONS) ×2 IMPLANT
UNDERPAD 30X30 INCONTINENT (UNDERPADS AND DIAPERS) ×2 IMPLANT
WATER STERILE IRR 1000ML POUR (IV SOLUTION) ×1 IMPLANT
YANKAUER SUCT BULB TIP NO VENT (SUCTIONS) ×2 IMPLANT

## 2012-08-28 NOTE — ED Notes (Signed)
Patient transported to X-ray 

## 2012-08-28 NOTE — Consult Note (Signed)
Reason for Consult:LEFT HAND ABSCESS Referring Physician: DR. Ralene Muskrat is an 47 y.o. male.  HPI: HISTORY DOCUMENTED IN CHART PT WITH WORSENING PAIN AND SWELLING IN HAND PRESENTED TO ED WITH RED SWOLLEN HAND UNABLE TO MOVE THUMB PT WITH LONG HISTORY OF INFECTIONS PT BEING TREATED AT [facility] FOR OPEN WOUNDS TO FEET  Past Medical History  Diagnosis Date  . Onychomycosis   . Hypertension   . Diabetic peripheral neuropathy   . Diabetic foot ulcer 12/09    left plantar area  . History of drug abuse     Cocaine and marijuana  . Exposure to trichomonas     treated empirically  . Cellulitis of left foot 03/2008    left 4th and 5th metatarsal area  . Hyperlipidemia   . 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 Ulcerative esophagitis 2007    severe, complicated with UGI bleed  . Mallory Janann August tear April 2009  . History of prolonged Q-T interval on ECG   . 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  . 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 CKD (chronic kidney disease) stage 2, GFR 60-89 ml/min     BL SCr 1.3-1.4  . Depression   . Peripheral vascular disease   . Type 2 diabetes mellitus, uncontrolled, with renal complications   . GERD (gastroesophageal reflux disease)     Past Surgical History  Procedure Laterality Date  . Toe amputation  06/2011    left; 4th toe  . Nephrectomy      L side  . I&d extremity  08/24/2011    Procedure: IRRIGATION AND DEBRIDEMENT EXTREMITY;  Surgeon: Cammy Copa, MD;  Location: [facility] OR;  Service: Orthopedics;  Laterality: Left;  . Amputation  08/24/2011    Procedure: AMPUTATION RAY;  Surgeon: Cammy Copa, MD;  Location: [facility] OR;  Service: Orthopedics;  Laterality: Left;  left fourth toe Ray Resection  . Amputation  12/05/2011    Procedure: AMPUTATION RAY;  Surgeon: Cammy Copa, MD;   Location: [facility] OR;  Service: Orthopedics;  Laterality: Left;  . I&d extremity  02/18/2012    Procedure: IRRIGATION AND DEBRIDEMENT EXTREMITY;  Surgeon: Kathryne Hitch, MD;  Location: [facility] OR;  Service: Orthopedics;  Laterality: Left;  . Amputation  02/18/2012    Procedure: AMPUTATION DIGIT;  Surgeon: Kathryne Hitch, MD;  Location: [facility] OR;  Service: Orthopedics;  Laterality: Left;  revison transmetatarsal    Family History  Problem Relation Age of Onset  . Diabetes type II Mother   . Pancreatic cancer Father     Social History:  reports that he has been smoking Cigarettes.  He has a 17 pack-year smoking history. He has never used smokeless tobacco. He reports that  drinks alcohol. He reports that he uses illicit drugs (Marijuana).  Allergies: No Known Allergies  Medications: I have reviewed the patient's current medications.  Results for orders placed during the hospital encounter of 08/28/12 (from the past 48 hour(s))  GLUCOSE, CAPILLARY     Status: Abnormal   Collection Time    08/28/12  2:04 PM      Result Value Range   Glucose-Capillary 256 (*) 70 - 99 mg/dL   Comment 1 Documented in Chart     Comment 2 Notify RN    CBC WITH DIFFERENTIAL     Status: Abnormal  Collection Time    08/28/12  2:05 PM      Result Value Range   WBC 21.6 (*) 4.0 - 10.5 K/uL   RBC 3.64 (*) 4.22 - 5.81 MIL/uL   Hemoglobin 10.9 (*) 13.0 - 17.0 g/dL   HCT 16.1 (*) 09.6 - 04.5 %   MCV 88.2  78.0 - 100.0 fL   MCH 29.9  26.0 - 34.0 pg   MCHC 34.0  30.0 - 36.0 g/dL   RDW 40.9  81.1 - 91.4 %   Platelets 412 (*) 150 - 400 K/uL   Neutrophils Relative % 88 (*) 43 - 77 %   Neutro Abs 18.9 (*) 1.7 - 7.7 K/uL   Lymphocytes Relative 6 (*) 12 - 46 %   Lymphs Abs 1.4  0.7 - 4.0 K/uL   Monocytes Relative 5  3 - 12 %   Monocytes Absolute 1.1 (*) 0.1 - 1.0 K/uL   Eosinophils Relative 1  0 - 5 %   Eosinophils Absolute 0.2  0.0 - 0.7 K/uL   Basophils Relative 0  0 - 1 %   Basophils Absolute 0.0  0.0  - 0.1 K/uL  BASIC METABOLIC PANEL     Status: Abnormal   Collection Time    08/28/12  2:05 PM      Result Value Range   Sodium 133 (*) 135 - 145 mEq/L   Potassium 3.8  3.5 - 5.1 mEq/L   Chloride 92 (*) 96 - 112 mEq/L   CO2 29  19 - 32 mEq/L   Glucose, Bld 263 (*) 70 - 99 mg/dL   BUN 13  6 - 23 mg/dL   Creatinine, Ser 7.82  0.50 - 1.35 mg/dL   Calcium 9.1  8.4 - 95.6 mg/dL   GFR calc non Af Amer 63 (*) >90 mL/min   GFR calc Af Amer 73 (*) >90 mL/min   Comment:            The eGFR has been calculated     using the CKD EPI equation.     This calculation has not been     validated in all clinical     situations.     eGFR's persistently     <90 mL/min signify     possible Chronic Kidney Disease.  SEDIMENTATION RATE     Status: Abnormal   Collection Time    08/28/12  2:05 PM      Result Value Range   Sed Rate 84 (*) 0 - 16 mm/hr  POCT I-STAT, CHEM 8     Status: Abnormal   Collection Time    08/28/12  3:37 PM      Result Value Range   Sodium 139  135 - 145 mEq/L   Potassium 3.6  3.5 - 5.1 mEq/L   Chloride 102  96 - 112 mEq/L   BUN 15  6 - 23 mg/dL   Creatinine, Ser 2.13  0.50 - 1.35 mg/dL   Glucose, Bld 92  70 - 99 mg/dL   Calcium, Ion 0.86 (*) 1.12 - 1.23 mmol/L   TCO2 31  0 - 100 mmol/L   Hemoglobin 13.9  13.0 - 17.0 g/dL   HCT 57.8  46.9 - 62.9 %    Dg Hand Complete Left  08/28/2012   *RADIOLOGY REPORT*  Clinical Data: Swelling and pain in the region of the thenar eminence.  No trauma.  Diabetic.  LEFT HAND - COMPLETE 3+ VIEW  Comparison: None.  Findings: Mild  diffuse soft tissue swelling.  No soft tissue gas. No radio-opaque foreign body.    No acute osseous abnormality.  IMPRESSION: Soft tissue swelling only.   Original Report Authenticated By: Jeronimo Greaves, M.D.    AS DOCUMENTED IN THE CHART Blood pressure 138/83, pulse 108, temperature 97.8 F (36.6 C), resp. rate 17, SpO2 99.00%. AWAKE ALERT NONTOXIC LEFT HAND: MODERATE SWELLING OVER THENAR EMINENCE AND FIRST WEB  SPACE FLUCTUANT AREA OVER WEB SPACE LIMITED MOBILITY TO THUMB SMALL OPEN WOUND IN WEB SPACE NO PURULENT DRAINAGE FINGERS WARM WELL PERFUSED GOOD MOBILITY TO INDEX/LONG/RING/SMALL   Assessment/Plan: LEFT THUMB DEEP SPACE INFECTION  TO OR FOR INCISION AND DRAINAGE AND IRRIGATION AND DEBRIDEMENT PT VOICED UNDERSTANDING OF PLAN CONSENT SIGNED PT WILL BE ADMITTED TO MEDICINE SERVICE WITH OTHER CO MORBIDITIES PT VOICED UNDERSTANDING AND REASON FOR SURGERY ON HIS HAND    Sharma Covert 08/28/2012, 8:49 PM

## 2012-08-28 NOTE — Transfer of Care (Signed)
Immediate Anesthesia Transfer of Care Note  Patient: [name]  Procedure(s) Performed: Procedure(s) with comments: IRRIGATION AND DEBRIDEMENT EXTREMITY (Left) - CYSTO TUBING/IRRIGATION, LEAD HAND.  Patient Location: PACU  Anesthesia Type:General  Level of Consciousness: awake, alert  and oriented  Airway & Oxygen Therapy: Patient Spontanous Breathing and Patient connected to nasal cannula oxygen  Post-op Assessment: Report given to PACU RN and Post -op Vital signs reviewed and stable  Post vital signs: Reviewed and stable  Complications: No apparent anesthesia complications

## 2012-08-28 NOTE — ED Provider Notes (Signed)
Date: 08/28/2012  Rate: 106  Rhythm: sinus tachycardia  QRS Axis: left  Intervals: normal  ST/T Wave abnormalities: normal  Conduction Disutrbances:none  Narrative Interpretation: Sinus tachycardia, left axis deviation. When compared with ECG of 07/27/2012, no significant changes are seen.  Old EKG Reviewed: unchanged  47 year old male with abscess in the right hand. Hand surgery is here and will take him to the OR for incision and drainage and he'll need to be admitted for medical management of infection.  I saw and evaluated the patient, reviewed the resident's note and I agree with the findings and plan.   Dione Booze, MD 08/29/12 1146

## 2012-08-28 NOTE — ED Provider Notes (Signed)
Pt care transferred.   Have called medicine team for admission, ortho consult pending.   Ortho taking to OR with medicine team admitting.     Bernadene Person, MD 08/29/12 0142   I agree with the resident plan and action above.  Derwood Kaplan, MD 08/29/12 [phone]

## 2012-08-28 NOTE — ED Provider Notes (Signed)
History     CSN: [id number]  Arrival date & time 08/28/12  1228   First MD Initiated Contact with Patient 08/28/12 1348      Chief Complaint  Patient presents with  . Hand Problem    (Consider location/radiation/quality/duration/timing/severity/associated sxs/prior treatment) HPI [name] is a 47 y.o. male history of diabetes, cellulitis, hypertension, multiple toe amputation and renal abscess presents to the emergency department complaining of left hand pain and swelling.  Exact onset of symptoms is unknown but it has been gradually worsening of the last couple days.  Patient states that he is unable to move his thumb the way he supposed to.  In addition he states that he has bilateral open feet wounds at  amputation site that are followed by from the wound clinic every Friday by Dr. Jimmey Ralph who per pt states are healing as planned.  Associated symptoms include diarrhea and chills.  Patient denies any fevers, weight loss, emesis.    Past Medical History  Diagnosis Date  . Onychomycosis   . Hypertension   . Diabetic peripheral neuropathy   . Diabetic foot ulcer 12/09    left plantar area  . History of drug abuse     Cocaine and marijuana  . Exposure to trichomonas     treated empirically  . Cellulitis of left foot 03/2008    left 4th and 5th metatarsal area  . Hyperlipidemia   . 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 Ulcerative esophagitis 2007    severe, complicated with UGI bleed  . Mallory Janann August tear April 2009  . History of prolonged Q-T interval on ECG   . 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  . 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 Chronic renal insufficiency   . Depression   . Peripheral vascular disease   . Type II diabetes mellitus   . GERD (gastroesophageal reflux disease)     Past Surgical History  Procedure  Laterality Date  . Toe amputation  06/2011    left; 4th toe  . Nephrectomy      L side  . I&d extremity  08/24/2011    Procedure: IRRIGATION AND DEBRIDEMENT EXTREMITY;  Surgeon: Cammy Copa, MD;  Location: [facility] OR;  Service: Orthopedics;  Laterality: Left;  . Amputation  08/24/2011    Procedure: AMPUTATION RAY;  Surgeon: Cammy Copa, MD;  Location: [facility] OR;  Service: Orthopedics;  Laterality: Left;  left fourth toe Ray Resection  . Amputation  12/05/2011    Procedure: AMPUTATION RAY;  Surgeon: Cammy Copa, MD;  Location: [facility] OR;  Service: Orthopedics;  Laterality: Left;  . I&d extremity  02/18/2012    Procedure: IRRIGATION AND DEBRIDEMENT EXTREMITY;  Surgeon: Kathryne Hitch, MD;  Location: [facility] OR;  Service: Orthopedics;  Laterality: Left;  . Amputation  02/18/2012    Procedure: AMPUTATION DIGIT;  Surgeon: Kathryne Hitch, MD;  Location: [facility] OR;  Service: Orthopedics;  Laterality: Left;  revison transmetatarsal    Family History  Problem Relation Age of Onset  . Diabetes type II Mother   . Pancreatic cancer Father     History  Substance Use Topics  . Smoking status: Current Some Day Smoker -- 0.50 packs/day for 34 years    Types: Cigarettes  . Smokeless tobacco: Never Used     Comment: Has stopped 2 weeks ago- QUIT line  info given to pt and wife for asst with smoking patchers, etc  . Alcohol Use: Yes     Comment: 12/05/2011 "occasionally; maybe New Years"      Review of Systems Ten systems reviewed and are negative for acute change, except as noted in the HPI.   Allergies  Review of patient's allergies indicates no known allergies.  Home Medications   Current Outpatient Rx  Name  Route  Sig  Dispense  Refill  . ACCU-CHEK FASTCLIX LANCETS MISC   Does not apply   1 each by Does not apply route daily before breakfast.   102 each   2   . acetaminophen (TYLENOL) 500 MG tablet   Oral   Take 1,000 mg by mouth daily as needed for pain. In the  morning         . Alum & Mag Hydroxide-Simeth (ANTACID LIQUID PO)   Oral   Take 30 mLs by mouth daily as needed.         Marland Kitchen atorvastatin (LIPITOR) 20 MG tablet   Oral   Take 1 tablet (20 mg total) by mouth daily.   30 tablet   0   . calcium carbonate (TUMS - DOSED IN MG ELEMENTAL CALCIUM) 500 MG chewable tablet   Oral   Chew 2 tablets by mouth daily as needed for heartburn.         . diphenhydramine-acetaminophen (TYLENOL PM) 25-500 MG TABS   Oral   Take 1 tablet by mouth at bedtime as needed (sleep/pain).         Marland Kitchen FLUoxetine (PROZAC) 10 MG capsule   Oral   Take 1 capsule (10 mg total) by mouth daily.   90 capsule   0   . gabapentin (NEURONTIN) 300 MG capsule   Oral   Take 1 capsule (300 mg total) by mouth 3 (three) times daily.   90 capsule   2   . glucose blood (ACCU-CHEK SMARTVIEW) test strip      Use as instructed   100 each   12     Check daily before breakfast   . insulin glargine (LANTUS) 100 UNIT/ML injection      Inject 25 units before bedtime   10 mL   3   . lisinopril (PRINIVIL,ZESTRIL) 20 MG tablet   Oral   Take 2 tablets (40 mg total) by mouth daily.   90 tablet   0   . pantoprazole (PROTONIX) 40 MG tablet   Oral   Take 1 tablet (40 mg total) by mouth daily.   90 tablet   11   . promethazine (PHENERGAN) 25 MG tablet   Oral   Take 1 tablet (25 mg total) by mouth every 6 (six) hours as needed for nausea.   20 tablet   0     BP 139/85  Pulse 73  Temp(Src) 97.8 F (36.6 C)  Resp 18  SpO2 96%  Physical Exam  Nursing note and vitals reviewed. Constitutional: He appears well-developed and well-nourished. No distress.  HENT:  Head: Normocephalic and atraumatic.  Eyes: Conjunctivae and EOM are normal.  Neck: Normal range of motion. Neck supple.  Cardiovascular:  Intact distal pulses  Musculoskeletal:  Right hand: Tenderness to palpation along the thenar prominence.  Inability to perform some opposition, hyperextension or  complete flexion do to pain.  All other extremities with normal ROM  Neurological:  No sensory deficit  Skin: He is not diaphoretic.  Swelling and warmth over thenar prominence. Left  foot with total toe amputation and open wound. Right foot with large open wound at amputation site of great toe.     ED Course  Procedures (including critical care time)  Labs Reviewed  CBC WITH DIFFERENTIAL - Abnormal; Notable for the following:    WBC 21.6 (*)    RBC 3.64 (*)    Hemoglobin 10.9 (*)    HCT 32.1 (*)    Platelets 412 (*)    Neutrophils Relative % 88 (*)    Neutro Abs 18.9 (*)    Lymphocytes Relative 6 (*)    Monocytes Absolute 1.1 (*)    All other components within normal limits  BASIC METABOLIC PANEL - Abnormal; Notable for the following:    Sodium 133 (*)    Chloride 92 (*)    Glucose, Bld 263 (*)    GFR calc non Af Amer 63 (*)    GFR calc Af Amer 73 (*)    All other components within normal limits  GLUCOSE, CAPILLARY - Abnormal; Notable for the following:    Glucose-Capillary 256 (*)    All other components within normal limits   Dg Hand Complete Left  08/28/2012   *RADIOLOGY REPORT*  Clinical Data: Swelling and pain in the region of the thenar eminence.  No trauma.  Diabetic.  LEFT HAND - COMPLETE 3+ VIEW  Comparison: None.  Findings: Mild diffuse soft tissue swelling.  No soft tissue gas. No radio-opaque foreign body.    No acute osseous abnormality.  IMPRESSION: Soft tissue swelling only.   Original Report Authenticated By: Jeronimo Greaves, M.D.     No diagnosis found.  Case discussed adn seen with attending who advises to consult hand.   Consult Hand: To see in ED as consult for possible septic joint. IV abx asked to be held until evaluation.   MDM   Pt is DM male w multiple amputations to ER w hand pain and swelling, no hx of gout. Question of cellulitis vs septic joint. Consult hand pending. Care resumed by oncoming provider.       Jaci Carrel, [place] 08/28/12  1553  Medical screening examination/treatment/procedure(s) were conducted as a shared visit with non-physician practitioner(s) and myself.  I personally evaluated the patient during the encounter  Derwood Kaplan, MD 08/28/12 1645

## 2012-08-28 NOTE — Brief Op Note (Signed)
08/28/2012  10:36 PM  PATIENT:  [name]  47 y.o. male  PRE-OPERATIVE DIAGNOSIS:  Left Hand Abscess  POST-OPERATIVE DIAGNOSIS:  Left Hand Abscess  PROCEDURE:  Procedure(s) with comments: IRRIGATION AND DEBRIDEMENT EXTREMITY (Left) - CYSTO TUBING/IRRIGATION, LEAD HAND.  SURGEON:  Surgeon(s) and Role:    * Sharma Covert, MD - Primary  PHYSICIAN ASSISTANT: NONE  ASSISTANTS: none   ANESTHESIA:   general  EBL:  Total I/O In: 625 [I.V.:625] Out: 15 [Blood:15]  BLOOD ADMINISTERED:none  DRAINS: none   LOCAL MEDICATIONS USED:  NONE  SPECIMEN:  No Specimen  DISPOSITION OF SPECIMEN:  N/A  COUNTS:  YES  TOURNIQUET:   Total Tourniquet Time Documented: Upper Arm (Left) - 4 minutes Total: Upper Arm (Left) - 4 minutes   DICTATION: .Other Dictation: Dictation Number [id number]  PLAN OF CARE: Admit to inpatient   PATIENT DISPOSITION:  PACU - hemodynamically stable.   Delay start of Pharmacological VTE agent (>24hrs) due to surgical blood loss or risk of bleeding: not applicable

## 2012-08-28 NOTE — H&P (Signed)
Date: 08/28/2012               Patient Name:  [name] MRN: [id number]  DOB: 1966-01-07 Age / Sex: 47 y.o., male   PCP: Dow Adolph, MD              Medical Service: Internal Medicine Teaching Service              Attending Physician: Dr. Criselda Peaches    First Contact: Dr. Earlene Plater Pager: [phone]  Second Contact: Dr. Everardo Beals Pager: [phone]            After Hours (After 5p/  First Contact Pager: [phone]  weekends / holidays): Second Contact Pager: [phone]      Chief Complaint: left hand pain and swelling  History of Present Illness: Patient is a 47 y.o. male with a PMHx of uncontrolled DM2 with multiple complications (A1c 13.7), CKD 2-3, BL toe ambutations, polysubstance abuse who presents to [facility] for evaluation of progressively worsening left hand pain and swelling x 2 days. Pt indicated that 2 days ago, he spent an extended period of time raking leaves in the yard without gloves. He developed small blister that continued to get progressively larger and more painful but has not extended to his forearm. His is still able to use this arm but has problems with grip due to limitation of flexion in his 1st and 2nd digits. The subsequent day, he continued to rake leaves, this time employing gloves, despite which, he continued to have pain. He has experienced chills, but denies fevers. He has increased fatigue. He also reported two episodes of non-bloody emesis on the day of admission which he attributes to his usual acid reflux. He appetite has been low since onset of pain.   Review of Systems: Constitutional:  admits to chills. Denies fever, diaphoresis, appetite change and fatigue.  HEENT: denies eye pain, eye discharge, ear pain, ear discharge, sore throat, nasal congestion.  Respiratory: admits to cough with sputum production. Denies SOB, DOE, cough, chest tightness, and wheezing.  Cardiovascular: admits to burning chest pain yesterday, when he experienced the , palpitations and  leg swelling.  Gastrointestinal: reports nausea, vomiting, abdominal pain, watery diarrhea, no blood in stool.  Genitourinary: denies dysuria, urgency, frequency, hematuria, flank pain and difficulty urinating.  Musculoskeletal: denies myalgias, back pain, joint swelling, arthralgias and gait problem.   Skin: denies pallor, rash. He as two wounds of both his feet which are chronic for him and he attends the wound center every Friday.   Neurological: denies dizziness, seizures, syncope, weakness, light-headedness, numbness and headaches.   Hematological: denies easy bruising, personal or family bleeding history.  Psychiatric: denies suicidal ideation, or sleep disturbance and but reports agitation with anger outburts. He was recently incarcerated for domestic violence and was initiated into anger management classes.    Current Outpatient Medications: Current Facility-Administered Medications  Medication Dose Route Frequency Provider Last Rate Last Dose  . 0.9 %  sodium chloride infusion   Intravenous Continuous Maitri S Kalia-Reynolds, DO      . HYDROmorphone (DILAUDID) 1 MG/ML injection           . HYDROmorphone (DILAUDID) injection 0.25-0.5 mg  0.25-0.5 mg Intravenous Q5 min PRN Aubery Lapping, MD   0.5 mg at 08/28/12 2239  . meperidine (DEMEROL) injection 6.25-12.5 mg  6.25-12.5 mg Intravenous Q5 min PRN Aubery Lapping, MD      . ondansetron [facility]) injection 4 mg  4 mg Intravenous Once PRN  Aubery Lapping, MD      . oxyCODONE (Oxy IR/ROXICODONE) immediate release tablet 5 mg  5 mg Oral Once PRN Aubery Lapping, MD       Or  . oxyCODONE (ROXICODONE) 5 MG/5ML solution 5 mg  5 mg Oral Once PRN Aubery Lapping, MD        Allergies: No Known Allergies   Past Medical History: Past Medical History  Diagnosis Date  . Onychomycosis   . Hypertension   . Diabetic peripheral neuropathy   . Diabetic foot ulcer 12/09    left plantar area  . History of drug abuse     Cocaine and  marijuana  . Exposure to trichomonas     treated empirically  . Cellulitis of left foot 03/2008    left 4th and 5th metatarsal area  . Hyperlipidemia   . 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 Ulcerative esophagitis 2007    severe, complicated with UGI bleed  . Mallory Janann August tear April 2009  . History of prolonged Q-T interval on ECG   . 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  . 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 CKD (chronic kidney disease) stage 2, GFR 60-89 ml/min     BL SCr 1.3-1.4  . Depression   . Peripheral vascular disease   . Type 2 diabetes mellitus, uncontrolled, with renal complications   . GERD (gastroesophageal reflux disease)     Past Surgical History: Past Surgical History  Procedure Laterality Date  . Toe amputation  06/2011    left; 4th toe  . Nephrectomy      L side  . I&d extremity  08/24/2011    Procedure: IRRIGATION AND DEBRIDEMENT EXTREMITY;  Surgeon: Cammy Copa, MD;  Location: [facility] OR;  Service: Orthopedics;  Laterality: Left;  . Amputation  08/24/2011    Procedure: AMPUTATION RAY;  Surgeon: Cammy Copa, MD;  Location: [facility] OR;  Service: Orthopedics;  Laterality: Left;  left fourth toe Ray Resection  . Amputation  12/05/2011    Procedure: AMPUTATION RAY;  Surgeon: Cammy Copa, MD;  Location: [facility] OR;  Service: Orthopedics;  Laterality: Left;  . I&d extremity  02/18/2012    Procedure: IRRIGATION AND DEBRIDEMENT EXTREMITY;  Surgeon: Kathryne Hitch, MD;  Location: [facility] OR;  Service: Orthopedics;  Laterality: Left;  . Amputation  02/18/2012    Procedure: AMPUTATION DIGIT;  Surgeon: Kathryne Hitch, MD;  Location: [facility] OR;  Service: Orthopedics;  Laterality: Left;  revison transmetatarsal    Family History: Family History  Problem Relation Age of Onset  . Diabetes type II Mother   .  Pancreatic cancer Father     Social History: History   Social History  . Marital Status: Married    Spouse Name: N/A    Number of Children: N/A  . Years of Education: N/A   Occupational History  . Not on file.   Social History Main Topics  . Smoking status: Current Some Day Smoker -- 0.50 packs/day for 34 years    Types: Cigarettes  . Smokeless tobacco: Never Used     Comment: Has stopped 2 weeks ago- QUIT line info given to pt and wife for asst with smoking patchers, etc  . Alcohol Use: Yes     Comment: 12/05/2011 "occasionally; maybe New Years"  . Drug Use: Yes  Special: Marijuana     Comment: 12/05/2011 "last time ~ 2 wk ago"  . Sexually Active: Yes   Other Topics Concern  . Not on file   Social History Narrative  . No narrative on file     Vital Signs: Blood pressure 113/66, pulse 108, temperature 98.5 F (36.9 C), resp. rate 9, SpO2 99.00%.  Physical Exam: General: Vital signs reviewed and noted. Well-developed, well-nourished, in no acute distress; alert, appropriate and cooperative throughout examination.  Head: Normocephalic, atraumatic.  Eyes: PERRL, EOMI, No signs of anemia or jaundince.  Nose: Mucous membranes moist, not inflammed, nonerythematous.  Throat: Oropharynx nonerythematous, no exudate appreciated.   Neck: No deformities, masses, or tenderness noted. Supple, No carotid Bruits, no JVD.  Lungs:  Normal respiratory effort. Clear to auscultation BL without crackles or wheezes.  Heart: RRR. S1 and S2 normal without gallop, murmur, or rubs.  Abdomen:  BS normoactive. Soft, Nondistended, non-tender.  No masses or organomegaly.  Extremities:  Lower extremities: No pretibial edema. The pedal pulses are strong. All toes are amputated on the left including the right great toe with an open wound of the stamp but no active bleeding. There is a smell emanating from his feet.  Left Hand:  swelling the thenar eminence and the proximal thumb. The proximal index  finder is also slightly swollen. There is tenderness and erythema of the same area. There is a small area of opening  in the interdigital space between the left thumb and index finger. There is no active drainage from this area. The pulses are present and the wrist. Joint is not involved, and there is normal range of motion.    Neurologic: A&O X3, CN II - XII are grossly intact. Motor strength is 5/5 in the all 4 extremities, Sensations intact to light touch, Cerebellar signs negative.  Skin: No visible rashes, scars.    Lab results:  CURRENT LABS: CBC:    Component Value Date/Time   WBC 21.6* 08/28/2012 1405   HGB 13.9 08/28/2012 1537   HCT 41.0 08/28/2012 1537   PLT 412* 08/28/2012 1405   MCV 88.2 08/28/2012 1405   NEUTROABS 18.9* 08/28/2012 1405   LYMPHSABS 1.4 08/28/2012 1405   MONOABS 1.1* 08/28/2012 1405   EOSABS 0.2 08/28/2012 1405   BASOSABS 0.0 08/28/2012 1405     Metabolic Panel:    Component Value Date/Time   NA 139 08/28/2012 1537   K 3.6 08/28/2012 1537   CL 102 08/28/2012 1537   CO2 29 08/28/2012 1405   BUN 15 08/28/2012 1537   CREATININE 1.20 08/28/2012 1537   GLUCOSE 92 08/28/2012 1537   CALCIUM 9.1 08/28/2012 1405     HISTORICAL LABS: Lab Results  Component Value Date   HGBA1C 13.7 06/21/2012    Lab Results  Component Value Date   CHOL 234* 06/21/2012   HDL 73 06/21/2012   LDLCALC 129* 06/21/2012   TRIG 162* 06/21/2012   CHOLHDL 3.2 06/21/2012    Lab Results  Component Value Date   TSH 2.477 05/20/2010   T3TOTAL 64.7* 07/12/2007   T4TOTAL 6.9 07/12/2007     Imaging results:   Dg Hand Complete Left (08/28/2012) - IMPRESSION: Soft tissue swelling only.   Original Report Authenticated By: Jeronimo Greaves, M.D.    Other results:  EKG (08/28/2012) - pending     Assessment & Plan:  Pt is a 47 y.o. yo male with a PMHx of uncontrolled type 2 diabetes with A1c of 13% who was admitted on 08/28/2012  with symptoms of left hand swelling and pain for 2 days. Interventions at  this time will be focused on  management of his left hand cellulitis.     Principal Problem:   Sepsis due to left hand cellulitis  Active Problems:   Diabetes Type 2, Uncontroled   HYPERTENSION   Diabetic foot ulcer   Depression   Recurrent Foot Osteomyelitis   Diarrhea   Acute bronchitis    1) Sepsis, secondary to Left hand cellulitis versus abscess: Clinical history and physical examination findings are suggestive of left hand cellulitis. On admission. The blood pressure is 134/87. The pulse rate was 108. He meets the criteria for sepsis, with increased pulse rate and a high white blood cell count of 21.6 with a source of infection. He is a febrile. X-ray of the hand reveals soft tissue swelling.  Plan. -Surgery was consulted by ED physician and, the patient will be taken to the OR for possible drainage. -Emperic clindamycin IV to cover the likely cause of his infection, including Staphylococcus aureus versus Streptococcus. -Intravenous normal saline at 100 cc per period -Blood cultures will be performed - wound cultures. -Pain management and hand elevation  -Will follow hand surgery recommendations.  2) Bilateral foot ulcers: Patient has had a prolonged history of foot ulcers following amputations as a complication of his diabetes. He is currently attending outpatient wound care center every Friday. He is also on doxycycline, which he reports that he has been taking for 2 months. Plan. -Wound care will be consulted. -He will continue to attend outpatient wound care center.  3) Type 2 diabetes with renal complications: Last A1c on 06/21/2012 was 13.7. His home regimen include Lantus 25u at bedtime. He reports compliance with his insulin. Previous experience with this patient, he has increased sensitivity to insulin with episodes of hypoglycemia. On admission. His random blood sugar was 305, which quickly reduced to 92. Plan. -Will continue with Lantus after the operation. -He'll  require adjustment of his insulin as outpatient.  4) Hypertension: Currently stable with blood pressure of 108/80. Takes lisinopril 20 mg daily. Plan. -We'll monitor and restart lisinopril.  5) Depression: On Prozac. Currently denies symptoms of suicide, and directions. However, reports symptoms are under outbursts with domestic violence and compliant with his medication. Plan. -Will continue with Prozac.  6) Diarrhea: Patient reports episodes of diarrhea, without bloody stools. He has no abdominal pain. Given his prolonged use of doxycycline. Will need to rule out c.diff Plan. -C. difficile PCR  7) Acute bronchitis: He reports 2 days history of cough without shortness of breath. He also reports sputum production. Will need to do a chest x-ray to rule out community-acquired pneumonia.   DVT PPX - SCD's while in bed  CODE STATUS - Full  CONSULTS PLACED - Hand surgery   DISPO - Disposition is deferred at this time, awaiting improvement of hand cellulitis.   Anticipated discharge in approximately 2-3 day(s).   The patient does have a current PCP Dow Adolph, MD) and does need an [facility] hospital follow-up appointment after discharge.    Is the [facility] hospital follow-up appointment a one-time only appointment? no.  Does the patient have transportation limitations that hinder transportation to clinic appointments? no.   Signed: Dow Adolph, MD  PGY-1, Internal Medicine Resident Pager: [phone] (7PM-7AM) 08/28/2012, 10:54 PM

## 2012-08-28 NOTE — ED Notes (Signed)
Per pt sts 2 days of left hand swelling. sts also some dirrhea and chills.

## 2012-08-28 NOTE — Preoperative (Signed)
Beta Blockers   Reason not to administer Beta Blockers:Not Applicable 

## 2012-08-28 NOTE — Anesthesia Procedure Notes (Signed)
Procedure Name: Intubation Date/Time: 08/28/2012 9:40 PM Performed by: Nicholos Johns Pre-anesthesia Checklist: Patient identified, Timeout performed, Emergency Drugs available, Suction available and Patient being monitored Patient Re-evaluated:Patient Re-evaluated prior to inductionOxygen Delivery Method: Circle system utilized Preoxygenation: Pre-oxygenation with 100% oxygen Intubation Type: IV induction Ventilation: Mask ventilation without difficulty Laryngoscope Size: Mac and 4 Grade View: Grade I Tube type: Oral Tube size: 7.5 mm Number of attempts: 1 Airway Equipment and Method: Stylet Placement Confirmation: ETT inserted through vocal cords under direct vision,  positive ETCO2 and breath sounds checked- equal and bilateral Secured at: 22 cm Tube secured with: Tape Dental Injury: Teeth and Oropharynx as per pre-operative assessment

## 2012-08-28 NOTE — ED Notes (Signed)
Evelena Peat, PA advised of BS=256, no orders.

## 2012-08-28 NOTE — Anesthesia Preprocedure Evaluation (Addendum)
Anesthesia Evaluation  Patient identified by MRN, date of birth, ID band Patient awake    Reviewed: Allergy & Precautions, H&P , NPO status , Patient's Chart, lab work & pertinent test results  History of Anesthesia Complications Negative for: history of anesthetic complications  Airway Mallampati: II TM Distance: >3 FB Neck ROM: Full    Dental  (+) Teeth Intact and Dental Advisory Given   Pulmonary Current Smoker,          Cardiovascular hypertension, + Peripheral Vascular Disease + dysrhythmias     Neuro/Psych Depression    GI/Hepatic PUD, GERD-  Medicated and Controlled,(+)     substance abuse  alcohol use, cocaine use and marijuana use,   Endo/Other  diabetes, Type 2, Insulin Dependent  Renal/GU Renal InsufficiencyRenal disease     Musculoskeletal  (+) Arthritis -, Osteoarthritis,    Abdominal   Peds  Hematology   Anesthesia Other Findings   Reproductive/Obstetrics                         Anesthesia Physical Anesthesia Plan  ASA: III and emergent  Anesthesia Plan: General   Post-op Pain Management:    Induction: Intravenous  Airway Management Planned: Oral ETT  Additional Equipment:   Intra-op Plan:   Post-operative Plan: Extubation in OR  Informed Consent: I have reviewed the patients History and Physical, chart, labs and discussed the procedure including the risks, benefits and alternatives for the proposed anesthesia with the patient or authorized representative who has indicated his/her understanding and acceptance.   Dental advisory given  Plan Discussed with: CRNA  Anesthesia Plan Comments:        Anesthesia Quick Evaluation

## 2012-08-28 NOTE — Anesthesia Postprocedure Evaluation (Signed)
Anesthesia Post Note  Patient: [name]  Procedure(s) Performed: Procedure(s) (LRB): IRRIGATION AND DEBRIDEMENT EXTREMITY (Left)  Anesthesia type: general  Patient location: PACU  Post pain: Pain level controlled  Post assessment: Patient's Cardiovascular Status Stable  Last Vitals:  Filed Vitals:   08/28/12 2245  BP: 124/70  Pulse: 87  Temp: 36.5 C  Resp: 15    Post vital signs: Reviewed and stable  Level of consciousness: sedated  Complications: No apparent anesthesia complications 

## 2012-08-29 ENCOUNTER — Encounter (HOSPITAL_COMMUNITY): Payer: Self-pay | Admitting: Orthopedic Surgery

## 2012-08-29 LAB — CBC
MCH: 28.8 pg (ref 26.0–34.0)
Platelets: 369 10*3/uL (ref 150–400)
RBC: 3.3 MIL/uL — ABNORMAL LOW (ref 4.22–5.81)
WBC: 17.4 10*3/uL — ABNORMAL HIGH (ref 4.0–10.5)

## 2012-08-29 LAB — COMPREHENSIVE METABOLIC PANEL
AST: 10 U/L (ref 0–37)
BUN: 14 mg/dL (ref 6–23)
CO2: 25 mEq/L (ref 19–32)
Chloride: 93 mEq/L — ABNORMAL LOW (ref 96–112)
Creatinine, Ser: 1.31 mg/dL (ref 0.50–1.35)
GFR calc non Af Amer: 64 mL/min — ABNORMAL LOW (ref >90)
Total Bilirubin: 0.4 mg/dL (ref 0.3–1.2)

## 2012-08-29 LAB — GLUCOSE, CAPILLARY
Glucose-Capillary: 303 mg/dL — ABNORMAL HIGH (ref 70–99)
Glucose-Capillary: 220 mg/dL — ABNORMAL HIGH (ref 70–99)
Glucose-Capillary: 168 mg/dL — ABNORMAL HIGH (ref 70–99)

## 2012-08-29 LAB — C-REACTIVE PROTEIN: CRP: 16.3 mg/dL — ABNORMAL HIGH (ref <0.60)

## 2012-08-29 MED ORDER — HYDROCODONE-ACETAMINOPHEN 5-325 MG PO TABS
1.0000 | ORAL_TABLET | ORAL | Status: DC | PRN
  Administered 2012-08-29 (×2): 2 via ORAL
  Filled 2012-08-29 (×2): qty 2

## 2012-08-29 MED ORDER — INSULIN GLARGINE 100 UNIT/ML ~~LOC~~ SOLN
25.0000 [IU] | Freq: Every day | SUBCUTANEOUS | Status: DC
  Administered 2012-08-29: 25 [IU] via SUBCUTANEOUS
  Filled 2012-08-29 (×2): qty 0.25

## 2012-08-29 MED ORDER — INSULIN ASPART 100 UNIT/ML ~~LOC~~ SOLN
0.0000 [IU] | Freq: Every day | SUBCUTANEOUS | Status: DC
  Administered 2012-09-02: 2 [IU] via SUBCUTANEOUS

## 2012-08-29 MED ORDER — HYDROCODONE-ACETAMINOPHEN 5-325 MG PO TABS: 1.0000 | ORAL_TABLET | Freq: Four times a day (QID) | ORAL | Status: DC | PRN

## 2012-08-29 MED ORDER — CLINDAMYCIN HCL 300 MG PO CAPS
450.0000 mg | ORAL_CAPSULE | Freq: Three times a day (TID) | ORAL | Status: DC
  Administered 2012-08-29 – 2012-08-31 (×6): 450 mg via ORAL
  Filled 2012-08-29 (×9): qty 1

## 2012-08-29 MED ORDER — INSULIN ASPART 100 UNIT/ML ~~LOC~~ SOLN
0.0000 [IU] | Freq: Three times a day (TID) | SUBCUTANEOUS | Status: DC
  Administered 2012-08-29: 11 [IU] via SUBCUTANEOUS

## 2012-08-29 MED ORDER — INSULIN ASPART 100 UNIT/ML ~~LOC~~ SOLN
0.0000 [IU] | Freq: Three times a day (TID) | SUBCUTANEOUS | Status: DC
  Administered 2012-08-29: 7 [IU] via SUBCUTANEOUS
  Administered 2012-08-29: 11 [IU] via SUBCUTANEOUS
  Administered 2012-08-30: 7 [IU] via SUBCUTANEOUS
  Administered 2012-08-30 – 2012-08-31 (×3): 4 [IU] via SUBCUTANEOUS
  Administered 2012-08-31: 11 [IU] via SUBCUTANEOUS
  Administered 2012-09-01: 15 [IU] via SUBCUTANEOUS
  Administered 2012-09-02: 3 [IU] via SUBCUTANEOUS
  Administered 2012-09-02: 4 [IU] via SUBCUTANEOUS
  Administered 2012-09-02: 7 [IU] via SUBCUTANEOUS
  Administered 2012-09-03 (×2): 4 [IU] via SUBCUTANEOUS

## 2012-08-29 MED ORDER — OXYCODONE-ACETAMINOPHEN 5-325 MG PO TABS
1.0000 | ORAL_TABLET | ORAL | Status: DC | PRN
  Administered 2012-08-29 – 2012-09-02 (×16): 1 via ORAL
  Filled 2012-08-29 (×18): qty 1

## 2012-08-29 MED ORDER — MORPHINE SULFATE 2 MG/ML IJ SOLN
2.0000 mg | INTRAMUSCULAR | Status: DC | PRN
  Administered 2012-08-29: 2 mg via INTRAVENOUS
  Filled 2012-08-29: qty 1

## 2012-08-29 MED ORDER — INSULIN GLARGINE 100 UNIT/ML ~~LOC~~ SOLN
20.0000 [IU] | Freq: Every day | SUBCUTANEOUS | Status: DC
  Filled 2012-08-29: qty 0.2

## 2012-08-29 MED ORDER — GABAPENTIN 300 MG PO CAPS
300.0000 mg | ORAL_CAPSULE | Freq: Three times a day (TID) | ORAL | Status: DC
  Administered 2012-08-29 – 2012-09-03 (×17): 300 mg via ORAL
  Filled 2012-08-29 (×18): qty 1

## 2012-08-29 MED ORDER — COLLAGENASE 250 UNIT/GM EX OINT
TOPICAL_OINTMENT | CUTANEOUS | Status: DC
  Administered 2012-08-29: 1 via TOPICAL
  Filled 2012-08-29: qty 30

## 2012-08-29 MED ORDER — CLINDAMYCIN PHOSPHATE 600 MG/50ML IV SOLN
600.0000 mg | Freq: Three times a day (TID) | INTRAVENOUS | Status: DC
  Administered 2012-08-29 (×2): 600 mg via INTRAVENOUS
  Filled 2012-08-29 (×4): qty 50

## 2012-08-29 MED ORDER — FLUOXETINE HCL 10 MG PO CAPS
10.0000 mg | ORAL_CAPSULE | Freq: Every day | ORAL | Status: DC
  Administered 2012-08-29 – 2012-09-03 (×6): 10 mg via ORAL
  Filled 2012-08-29 (×6): qty 1

## 2012-08-29 MED ORDER — INSULIN ASPART 100 UNIT/ML ~~LOC~~ SOLN: 0.0000 [IU] | Freq: Three times a day (TID) | SUBCUTANEOUS | Status: DC

## 2012-08-29 MED ORDER — LISINOPRIL 40 MG PO TABS
40.0000 mg | ORAL_TABLET | Freq: Every day | ORAL | Status: DC
  Administered 2012-08-29 – 2012-09-03 (×6): 40 mg via ORAL
  Filled 2012-08-29 (×6): qty 1

## 2012-08-29 NOTE — Op Note (Signed)
[name], [name] NO.:  [id number]  MEDICAL RECORD NO.:  [id number]  LOCATION:  5530                         FACILITY:  [facility]  PHYSICIAN:  Wesley Done, [name]  DATE OF BIRTH:  1965-04-27  DATE OF PROCEDURE:  08/28/2012 DATE OF DISCHARGE:                              OPERATIVE REPORT   PREOPERATIVE DIAGNOSIS:  Left hand deep abscess.  POSTOPERATIVE DIAGNOSIS:  Left hand deep abscess.  ATTENDING PHYSICIAN:  Wesley Done, [name], was scrubbed and present for the entire procedure.  ASSISTANT SURGEON:  None.  ANESTHESIA:  General via LMA.  TOURNIQUET TIME:  4 minutes 250 mmHg.  SURGICAL PROCEDURE:  Left hand incision and drainage of deep palmar abscess.  DRAINS:  Two blue vessel loops.  SURGICAL INDICATIONS:  [name] is a right-hand dominant gentleman, who presented to the ER with a left hand abscess.  The patient was seen and evaluated in the ER and recommended he undergo the above procedure. Risks, benefits, and alternatives were discussed in detail with the patient and signed informed consent was obtained.  Risks include but not limited to bleeding, infection, damage to nearby nerves, arteries, or tendons, loss of motion of the wrist and digits, incomplete relief of symptoms, and need for further surgical intervention.  DESCRIPTION OF PROCEDURE:  The patient was properly identified in the preop holding area and marked with a permanent marker made on the left wrist and left hand to indicate the correct operative site.  The patient was then brought back to the operating room and placed supine on the anesthesia table where general anesthesia was administered.  The patient tolerated this well.  A well-padded tourniquet was then placed on left brachium, sealed with 1000 drape.  The left upper extremity was then prepped and draped in normal sterile fashion.  Time-out was called, correct side was identified, and the procedure was then begun.   The patient did have abscess in the first web space.  A longitudinal incision made directly in the first web space.  Limb was then elevated and tourniquet inflated.  Deep dissection carried down through the skin and subcutaneous tissue down to the first dorsal web space.  The patient had a gross infection with large amounts of purulence.  Wound cultures were then taken.  Copious wound irrigation [name] throughout.  Dissection was all the way carried down to the flexor sheath and FPL which appeared to be in good continuity.  The abscess was then copiously drained. Drainage of the FPL tendon was then carried out along the flexor sheath. Copious wound irrigation [name], a small outflow incision was then made in the palm.  The area thoroughly irrigated from the web space all the way out to the palm.  After thorough irrigation and excisional debridement, the wound was then loosely reapproximated and closed over 2 blue vessel loops.  Adaptic dressing, sterile compressive bandage was applied.  The patient tolerated the procedure well and returned to the recovery room in good condition after being placed in a thumb spica splint, extubated, and taken to recovery room in good condition.  POSTOPERATIVE PLAN:  The patient will be admitted to the Internal  Medicine Service, change the wound in approximately 48 hours.  Continue the IV antibiotics per the Medicine Service.  If he continues to improve, likely we will need outpatient wound care if the wound care center already established patient.     Wesley Done, [name]     FWO/MEDQ  D:  08/28/2012  T:  08/29/2012  Job:  [id number]

## 2012-08-29 NOTE — Progress Notes (Signed)
Subjective:    Interval Events:  Throbbing pain in left hand.  Vicodin and morphine has not helped.  Requesting Percocet. No more diarrhea. Denies dyspnea and chest pain.     Objective:    Vital Signs:   Temp:  [97.7 F-98.6 F] 98.6 F (37 C) (05/22 0609) Pulse Rate:  [73-108] 100 (05/22 0609) Resp:  [9-26] 16 (05/22 0609) BP: (113-163)/(63-91) 149/82 mmHg (05/22 0609) SpO2:  [96 %-100 %] 99 % (05/22 0609)   Weights: Filed Weights   08/28/12 2325 08/29/12 0500  Weight: 191 lb (86.637 kg) 190 lb (86.183 kg)    Intake/Output:   Gross per 24 hour  Intake    628 ml  Output     15 ml  Net    613 ml     Last BM Date: 08/28/12   Physical Exam: GENERAL: alert and oriented; resting comfortably in bed and in no distress LUNGS: clear to auscultation bilaterally, normal work of breathing HEART: normal rate; regular rhythm; normal S1 and S2, no S3 or S4 appreciated; no murmurs, rubs, or clicks EXTREMITIES: normal cap refill in left finger tips, moves fingers SKIN: chronic, open wounds on feet bilateral with dressings C/D/I    Labs: Basic Metabolic Panel: Lab 08/28/12 1405 08/28/12 1537 08/29/12 0540  NA 133* 139 129*  K 3.8 3.6 4.7  CL 92* 102 93*  CO2 29  --  25  GLUCOSE 263* 92 340*  BUN 13 15 14   CREATININE 1.33 1.20 1.31  CALCIUM 9.1  --  8.1*    Liver Function Tests: Lab 08/28/12 1405 08/29/12 0540  AST 15 10  ALT 13 10  ALKPHOS 115 104  BILITOT 0.3 0.4  PROT 7.7 6.7  ALBUMIN 3.1* 2.4*    CBC: Lab 08/28/12 1405 08/28/12 1537 08/29/12 0540  WBC 21.6*  --  17.4*  NEUTROABS 18.9*  --   --   HGB 10.9* 13.9 9.5*  HCT 32.1* 41.0 29.2*  MCV 88.2  --  88.5  PLT 412*  --  369    CBG: Lab 08/28/12 1404 08/28/12 2248 08/29/12 0738  GLUCAP 256* 317* 303*    Microbiology: Results for orders placed during the hospital encounter of 08/28/12  CULTURE, ROUTINE-ABSCESS     Status: None   Collection Time    08/28/12  9:50 PM      Result Value  Range Status   Specimen Description ABSCESS HAND LEFT   Final   Special Requests PATIENT ON FOLLOWING CLEOCIN   Final   Gram Stain     Final   Value: FEW WBC PRESENT,BOTH PMN AND MONONUCLEAR     NO SQUAMOUS EPITHELIAL CELLS SEEN     FEW GRAM POSITIVE COCCI     IN PAIRS   Culture PENDING   Incomplete   Report Status PENDING   Incomplete    Imaging:  No new imaging    Medications:    Infusions:     Scheduled Medications: . clindamycin  450 mg Oral Q8H  . collagenase   Topical UD  . heparin  5,000 Units Subcutaneous Q8H  . HYDROmorphone      . insulin aspart  0-20 Units Subcutaneous TID WC  . insulin aspart  0-5 Units Subcutaneous QHS  . insulin glargine  20 Units Subcutaneous QHS  . sodium chloride  3 mL Intravenous Q12H     PRN Medications: ondansetron (ZOFRAN) IV, ondansetron, oxyCODONE-acetaminophen    Assessment/ Plan:    1.   Cellulitis and  abscess of hand:  Infected blister, sustained while doing yard work.  Gram stain of operative sample with gram positive cocci in pairs, consistent with Streptococcus pyogenes.  We'll treat with clindamycin until MRSA ruled out with speciation.  Continue care per wound consult. - Clindamycin 400 mg PO TID (day 1/10) - Follow up with hand surgeon outpt - Continue following with wound clinic - Oxycodone-acetaminophen 5-325 mg every 4 hours when necessary  2.   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  Reports excellent compliance since he received medicaid.  Hemoblogin A1c still high at 13.7 though.  Urine albumin to creatinine ratio elevated at 100.2 on 06/21/2012. Patient on lisinopril 40 mg daily. Patient also takes gabapentin 300 mg 3 times a day for diabetic neuropathy. - Continue Lantus 25U qHS - Add resistant scale correctional insulin aspart with meals and at bedtime - Restart gabapentin 300 mg 3 times a day  3.   Bilateral, diabetic, foot ulcers:  Has  been on doxycycline for several weeks, prescribed by wound clinic.  Wounds continue to be non-healing, likely because of poor circulation and diabetes.  Will hold doxycycline and treat with clindamycin as noted above. - Collagenase per wound consult - Follow up with wound clinic  4.   Hypertension:  At home he takes lisinopril 40 mg daily. This was held admission. Blood pressures now elevated. Will restart this today. - Restart lisinopril 40 mg daily  5.   Diarrhea:  Resolved. C diff PCR not collected.  Order canceled.  Cancel enteric precautions. - Cancel enteric precautions  6.   Acute bronchitis:  Reports 2 days of cough without dyspnea.  Some sputum production with cough.  No evidence of pneumonia on chest xray and lung auscultation is normal.  Will treat symptomatically since this is almost certainly viral in nature.  7.   Depression:  Stable. At home he takes fluoxetine 10 mg daily. This was held admission. We will restart this now. - Restart fluoxetine 10 mg daily  8.   Prophylaxis:  Heparin 5,000 units [place] TID for VTE  9.   Disposition:  OPC patient.  Expect discharge today or tomorrow.  OPC follow up established.  Length of Stay: 1 days   Signed by:  Dorthula Rue. Earlene Plater, MD PGY-I, Internal Medicine Pager [phone]  08/29/2012, 11:25 AM

## 2012-08-29 NOTE — Progress Notes (Signed)
[name] [id number] Code Status: full   Admission Data: 08/29/2012 2:15 AM Attending Provider:  Criselda [name] [name]:WGNFAOZ, Wesley Burdock, MD Consults/ Treatment Team:    [name] is a 47 y.o. male patient admitted from ED awake, alert - oriented  X 3 - no acute distress noted.  VSS - Blood pressure 163/91, pulse 90, temperature 98.3 F (36.8 C), temperature source Oral, resp. rate 18, height 5\' 8"  (1.727 m), weight 86.637 kg (191 lb), SpO2 100.00%.  no c/o shortness of breath, no c/o chest pain. Cardiac tele # [phone], in place, cardiac monitor yields:normal sinus rhythm.  IV Fluids:  IV in place, occlusive dsg intact without redness, IV cath antecubital right, condition patent and no redness normal saline.  Allergies:  No Known Allergies   Past Medical History  Diagnosis Date  . Onychomycosis   . Hypertension   . Diabetic peripheral neuropathy   . Diabetic foot ulcer 12/09    left plantar area  . History of drug abuse     Cocaine and marijuana  . Exposure to trichomonas     treated empirically  . Cellulitis of left foot 03/2008    left 4th and 5th metatarsal area  . Hyperlipidemia   . 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 Ulcerative esophagitis 2007    severe, complicated with UGI bleed  . Mallory Janann August tear April 2009  . History of prolonged Q-T interval on ECG   . 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  . 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 CKD (chronic kidney disease) stage 2, GFR 60-89 ml/min     BL SCr 1.3-1.4  . Depression   . Peripheral vascular disease   . Type 2 diabetes mellitus, uncontrolled, with renal complications   . GERD (gastroesophageal reflux disease)    Medications Prior to Admission  Medication Sig Dispense Refill  . ACCU-CHEK FASTCLIX LANCETS MISC 1 each by Does not apply route daily before breakfast.  102 each   2  . acetaminophen (TYLENOL) 500 MG tablet Take 1,000 mg by mouth daily as needed for pain. In the morning      . Alum & Mag Hydroxide-Simeth (ANTACID LIQUID PO) Take 30 mLs by mouth daily as needed.      Marland Kitchen atorvastatin (LIPITOR) 20 MG tablet Take 1 tablet (20 mg total) by mouth daily.  30 tablet  0  . calcium carbonate (TUMS - DOSED IN MG ELEMENTAL CALCIUM) 500 MG chewable tablet Chew 2 tablets by mouth daily as needed for heartburn.      . diphenhydramine-acetaminophen (TYLENOL PM) 25-500 MG TABS Take 1 tablet by mouth at bedtime as needed (sleep/pain).      Marland Kitchen doxycycline (VIBRAMYCIN) 100 MG capsule Take 100 mg by mouth 2 (two) times daily.      Marland Kitchen FLUoxetine (PROZAC) 10 MG capsule Take 1 capsule (10 mg total) by mouth daily.  90 capsule  0  . gabapentin (NEURONTIN) 300 MG capsule Take 1 capsule (300 mg total) by mouth 3 (three) times daily.  90 capsule  2  . glucose blood (ACCU-CHEK SMARTVIEW) test strip Use as instructed  100 each  12  . insulin glargine (LANTUS) 100 UNIT/ML injection Inject 25 units before bedtime  10 mL  3  . lisinopril (PRINIVIL,ZESTRIL) 20 MG tablet Take 2 tablets (40 mg total) by mouth daily.  90 tablet  0  .  pantoprazole (PROTONIX) 40 MG tablet Take 1 tablet (40 mg total) by mouth daily.  90 tablet  11  . promethazine (PHENERGAN) 25 MG tablet Take 1 tablet (25 mg total) by mouth every 6 (six) hours as needed for nausea.  20 tablet  0   History:  obtained from the patient. Tobacco/alcohol: denied none  Orientation to room, and floor completed with information packet given to patient/family.  Patient declined safety video at this time.  Admission INP armband ID verified with patient/family, and in place.   SR up x 2, fall assessment complete, with patient and family able to verbalize understanding of risk associated with falls, and verbalized understanding to call nsg before up out of bed.  Call light within reach, patient able to voice, and demonstrate understanding.   Skin, clean-dry- intact without evidence of bruising, or skin tears.   No evidence of skin break down noted on exam.     Will cont to eval and treat per MD orders.  Wesley Seen, RN 08/29/2012 2:15 AM

## 2012-08-29 NOTE — Consult Note (Signed)
WOC consult Note Reason for Consult: Chronic Bilateral foot  Ulcers Pt is followed by the outpatient wound care center for serial debridements and is scheduled to begin hyperbaric wound therapy soon to promote healing. Wound type: Full thickness wounds Pressure Ulcer POA: Not a pressure ulcer Measurement: Right great toe stump 3cm X 2.5xcm X 0.75, 80% yellow, 10% red, 10% black, moderate amount of foul smelling brownish drainage, surrounding skin intact.  Left foot stump 2cm X 1.5cm X 0.2cm, 90% yellow, 10% red, moderate amount of yellowish drainage surrounding skin is callused and yellow. Dressing procedure/placement/frequency: Continue present plan of care with Santyl ointment mixed with wound gel to right great toe and left foot stumps, covered with moist gauze and wrapped with kerlex. Pt states this is performed every other day to assist with removal of nonviable tissue.  He can resume follow-up with the outpatient wound care center after discharge. Norva Karvonen RN, MSN Student Please re-consult if further assistance is needed.  Thank-you,  Cammie Mcgee MSN, RN, CWOCN, [place], CNS [phone]

## 2012-08-29 NOTE — Care Management Note (Signed)
    Page 1 of 2   09/03/2012     3:52:49 PM   CARE MANAGEMENT NOTE 09/03/2012  Patient:  [name], [name]   Account Number:  [id number]  Date Initiated:  08/29/2012  Documentation initiated by:  Letha Cape  Subjective/Objective Assessment:   dx sepsis  admit lives with spouse.     Action/Plan:   5/23- I and D of hand  5/25 OR for Lt transmetatarsal amp and rt great toe amputation.   Anticipated DC Date:  09/03/2012   Anticipated DC Plan:  HOME/SELF CARE      DC Planning Services  CM consult  MATCH Program      [facility] Choice  DURABLE MEDICAL EQUIPMENT   Choice offered to / List presented to:             Status of service:  Completed, signed off Medicare Important Message given?   (If response is "NO", the following Medicare IM given date fields will be blank) Date Medicare IM given:   Date Additional Medicare IM given:    Discharge Disposition:  HOME/SELF CARE  Per UR Regulation:  Reviewed for med. necessity/level of care/duration of stay  If discussed at Long Length of Stay Meetings, dates discussed:    Comments:  09/03/12 15:50 Letha Cape RN, BSN [phone] patient for dc today, stated he needs help with medications, patient only needed $6 so went thru [facility] cost transfer to help patient get meds thru our outpt pharmacy.  09/02/2012 Pt s/p bilateral foot surgery on 09/01/12, with recent hand surgery. Pt is nonweight bearing, PT recommending wheelchair. Will follow for progression and d/c needs. Johny Shock RN MPH, case manager, [phone]  08/29/12 8:27 Letha Cape RN, BSN [phone] patient lives with spouse, NCM will continue to follow for dc needs.

## 2012-08-29 NOTE — Anesthesia Postprocedure Evaluation (Signed)
Anesthesia Post Note  Patient: [name]  Procedure(s) Performed: Procedure(s) (LRB): IRRIGATION AND DEBRIDEMENT EXTREMITY (Left)  Anesthesia type: general  Patient location: PACU  Post pain: Pain level controlled  Post assessment: Patient's Cardiovascular Status Stable  Last Vitals:  Filed Vitals:   08/28/12 2245  BP: 124/70  Pulse: 87  Temp: 36.5 C  Resp: 15    Post vital signs: Reviewed and stable  Level of consciousness: sedated  Complications: No apparent anesthesia complications

## 2012-08-29 NOTE — Progress Notes (Signed)
Will see patient tomorrow and change dressing and remove drain If looks better could go home tomorrow with approriate follow up

## 2012-08-29 NOTE — H&P (Signed)
Internal Medicine Teaching Service Attending Note Date: 08/29/2012  Patient name: [name]  Medical record number: [id number]  Date of birth: 14-Jun-1965   CC: Left hand pain, swelling  I have seen and evaluated [name] and discussed their care with the Residency Team.    [name] is a 47yo man with PMH of DM2, uncontrolled with multiple complications, CKD, b/l toe amputations who presented to the [facility] ED for worsening left hand pain and swelling X 2 days ago.  He cannot remember an inciting event, but he does note that 2 days ago he was working in his yard and raking leaves more than usual without gloves on.  He noticed some pain in the interdigit space between his thumb and first finger.  He also developed a small blister there.  The subsequent day, he continued to rake leaves, but used glvoes this time.  He thought the blister would "pop" but instead it continued to grow larger and painful and eventually caused decreased ability to close a fist.  He reports + chills, but no fevers and increased fatigue.  The main symptom today is pain.  On the evening of admission, he underwent surgical I&D for the wound and the pain post op is about a 9/10.   Further symptoms include cough with sputum production but no SOB, chest pain.  He does report episodes of watery diarrhea over the past week, but no BM today.  He was recently incarcerated for domestic violence, but this was prior to his hand issues.  He is a smoker and admits to Marijuana use.  He has been on chronic doxycycline for chronic open foot wounds.   For further medical history, meds, allergies and ROS, please see resident note.    Physical Exam: Blood pressure 149/82, pulse 100, temperature 98.6 F (37 C), temperature source Oral, resp. rate 16, height 5\' 8"  (1.727 m), weight 190 lb (86.183 kg), SpO2 99.00%. General appearance: alert, cooperative, appears stated age and no distress Head: Normocephalic, without obvious abnormality,  atraumatic Eyes: EOMI, anicteric sclerae Heart: RR, NR, no murmur Abdomen: soft, NT Extremities: + dressing to LUE to elbow, immobilized hand on that side.  IV to RUE.  + non healed wound to site of RLE great toe amputation with some odor and greenish drainage.  Amputations to all 5 toes noted on the left with no open wounds.  These are all present on admission Pulses: 2+ in bilateral LE and RUE, unable to evaluate in LUE Skin: multiple well healed wounds to back, abdomen.  no rash noted  Lab results: Results for orders placed during the hospital encounter of 08/28/12 (from the past 24 hour(s))  GLUCOSE, CAPILLARY     Status: Abnormal   Collection Time    08/28/12  2:04 PM      Result Value Range   Glucose-Capillary 256 (*) 70 - 99 mg/dL   Comment 1 Documented in Chart     Comment 2 Notify RN    CBC WITH DIFFERENTIAL     Status: Abnormal   Collection Time    08/28/12  2:05 PM      Result Value Range   WBC 21.6 (*) 4.0 - 10.5 K/uL   RBC 3.64 (*) 4.22 - 5.81 MIL/uL   Hemoglobin 10.9 (*) 13.0 - 17.0 g/dL   HCT 62.1 (*) 30.8 - 65.7 %   MCV 88.2  78.0 - 100.0 fL   MCH 29.9  26.0 - 34.0 pg   MCHC 34.0  30.0 - 36.0 g/dL   RDW 16.1  09.6 - 04.5 %   Platelets 412 (*) 150 - 400 K/uL   Neutrophils Relative % 88 (*) 43 - 77 %   Neutro Abs 18.9 (*) 1.7 - 7.7 K/uL   Lymphocytes Relative 6 (*) 12 - 46 %   Lymphs Abs 1.4  0.7 - 4.0 K/uL   Monocytes Relative 5  3 - 12 %   Monocytes Absolute 1.1 (*) 0.1 - 1.0 K/uL   Eosinophils Relative 1  0 - 5 %   Eosinophils Absolute 0.2  0.0 - 0.7 K/uL   Basophils Relative 0  0 - 1 %   Basophils Absolute 0.0  0.0 - 0.1 K/uL  BASIC METABOLIC PANEL     Status: Abnormal   Collection Time    08/28/12  2:05 PM      Result Value Range   Sodium 133 (*) 135 - 145 mEq/L   Potassium 3.8  3.5 - 5.1 mEq/L   Chloride 92 (*) 96 - 112 mEq/L   CO2 29  19 - 32 mEq/L   Glucose, Bld 263 (*) 70 - 99 mg/dL   BUN 13  6 - 23 mg/dL   Creatinine, Ser 4.09  0.50 - 1.35  mg/dL   Calcium 9.1  8.4 - 81.1 mg/dL   GFR calc non Af Amer 63 (*) >90 mL/min   GFR calc Af Amer 73 (*) >90 mL/min  SEDIMENTATION RATE     Status: Abnormal   Collection Time    08/28/12  2:05 PM      Result Value Range   Sed Rate 84 (*) 0 - 16 mm/hr  HEPATIC FUNCTION PANEL     Status: Abnormal   Collection Time    08/28/12  2:05 PM      Result Value Range   Total Protein 7.7  6.0 - 8.3 g/dL   Albumin 3.1 (*) 3.5 - 5.2 g/dL   AST 15  0 - 37 U/L   ALT 13  0 - 53 U/L   Alkaline Phosphatase 115  39 - 117 U/L   Total Bilirubin 0.3  0.3 - 1.2 mg/dL   Bilirubin, Direct <9.1  0.0 - 0.3 mg/dL   Indirect Bilirubin NOT CALCULATED  0.3 - 0.9 mg/dL  LIPASE, BLOOD     Status: None   Collection Time    08/28/12  2:05 PM      Result Value Range   Lipase 14  11 - 59 U/L  POCT I-STAT, CHEM 8     Status: Abnormal   Collection Time    08/28/12  3:37 PM      Result Value Range   Sodium 139  135 - 145 mEq/L   Potassium 3.6  3.5 - 5.1 mEq/L   Chloride 102  96 - 112 mEq/L   BUN 15  6 - 23 mg/dL   Creatinine, Ser 4.78  0.50 - 1.35 mg/dL   Glucose, Bld 92  70 - 99 mg/dL   Calcium, Ion 2.95 (*) 1.12 - 1.23 mmol/L   TCO2 31  0 - 100 mmol/L   Hemoglobin 13.9  13.0 - 17.0 g/dL   HCT 62.1  30.8 - 65.7 %  URINALYSIS, ROUTINE W REFLEX MICROSCOPIC     Status: Abnormal   Collection Time    08/28/12  8:33 PM      Result Value Range   Color, Urine YELLOW  YELLOW   APPearance CLEAR  CLEAR   Specific  Gravity, Urine 1.034 (*) 1.005 - 1.030   pH 7.0  5.0 - 8.0   Glucose, UA >1000 (*) NEGATIVE mg/dL   Hgb urine dipstick NEGATIVE  NEGATIVE   Bilirubin Urine SMALL (*) NEGATIVE   Ketones, ur 15 (*) NEGATIVE mg/dL   Protein, ur 409 (*) NEGATIVE mg/dL   Urobilinogen, UA 0.2  0.0 - 1.0 mg/dL   Nitrite NEGATIVE  NEGATIVE   Leukocytes, UA NEGATIVE  NEGATIVE  URINE MICROSCOPIC-ADD ON     Status: None   Collection Time    08/28/12  8:33 PM      Result Value Range   Squamous Epithelial / LPF RARE  RARE    WBC, UA 0-2  <3 WBC/hpf   RBC / HPF 0-2  <3 RBC/hpf   Bacteria, UA RARE  RARE  ANAEROBIC CULTURE     Status: None   Collection Time    08/28/12  9:50 PM      Result Value Range   Specimen Description ABSCESS HAND LEFT     Special Requests PATIENT ON FOLLOWING CLEOCIN     Gram Stain PENDING     Culture       Value: NO ANAEROBES ISOLATED; CULTURE IN PROGRESS FOR 5 DAYS   Report Status PENDING    CULTURE, ROUTINE-ABSCESS     Status: None   Collection Time    08/28/12  9:50 PM      Result Value Range   Specimen Description ABSCESS HAND LEFT     Special Requests PATIENT ON FOLLOWING CLEOCIN     Gram Stain       Value: FEW WBC PRESENT,BOTH PMN AND MONONUCLEAR     NO SQUAMOUS EPITHELIAL CELLS SEEN     FEW GRAM POSITIVE COCCI     IN PAIRS   Culture PENDING     Report Status PENDING    GLUCOSE, CAPILLARY     Status: Abnormal   Collection Time    08/28/12 10:48 PM      Result Value Range   Glucose-Capillary 317 (*) 70 - 99 mg/dL  COMPREHENSIVE METABOLIC PANEL     Status: Abnormal   Collection Time    08/29/12  5:40 AM      Result Value Range   Sodium 129 (*) 135 - 145 mEq/L   Potassium 4.7  3.5 - 5.1 mEq/L   Chloride 93 (*) 96 - 112 mEq/L   CO2 25  19 - 32 mEq/L   Glucose, Bld 340 (*) 70 - 99 mg/dL   BUN 14  6 - 23 mg/dL   Creatinine, Ser 8.11  0.50 - 1.35 mg/dL   Calcium 8.1 (*) 8.4 - 10.5 mg/dL   Total Protein 6.7  6.0 - 8.3 g/dL   Albumin 2.4 (*) 3.5 - 5.2 g/dL   AST 10  0 - 37 U/L   ALT 10  0 - 53 U/L   Alkaline Phosphatase 104  39 - 117 U/L   Total Bilirubin 0.4  0.3 - 1.2 mg/dL   GFR calc non Af Amer 64 (*) >90 mL/min   GFR calc Af Amer 74 (*) >90 mL/min  CBC     Status: Abnormal   Collection Time    08/29/12  5:40 AM      Result Value Range   WBC 17.4 (*) 4.0 - 10.5 K/uL   RBC 3.30 (*) 4.22 - 5.81 MIL/uL   Hemoglobin 9.5 (*) 13.0 - 17.0 g/dL   HCT 91.4 (*) 78.2 - 95.6 %  MCV 88.5  78.0 - 100.0 fL   MCH 28.8  26.0 - 34.0 pg   MCHC 32.5  30.0 - 36.0 g/dL    RDW 11.9  14.7 - 82.9 %   Platelets 369  150 - 400 K/uL  C-REACTIVE PROTEIN     Status: Abnormal   Collection Time    08/29/12  5:40 AM      Result Value Range   CRP 16.3 (*) <0.60 mg/dL  GLUCOSE, CAPILLARY     Status: Abnormal   Collection Time    08/29/12  7:38 AM      Result Value Range   Glucose-Capillary 303 (*) 70 - 99 mg/dL    Imaging results:  Dg Chest 2 View  08/28/2012   *RADIOLOGY REPORT*  Clinical Data: Cough, leukocytosis, hypertension, diabetes, history smoking  CHEST - 2 VIEW  Comparison: 08/02/2012  Findings: Normal heart size, mediastinal contours, and pulmonary vascularity. Minimal chronic peribronchial thickening. No acute infiltrate, pleural effusion, or pneumothorax. No acute osseous findings.  IMPRESSION: Minimal chronic bronchitic changes without acute abnormalities.   Original Report Authenticated By: Ulyses Southward, M.D.   Dg Hand Complete Left  08/28/2012   *RADIOLOGY REPORT*  Clinical Data: Swelling and pain in the region of the thenar eminence.  No trauma.  Diabetic.  LEFT HAND - COMPLETE 3+ VIEW  Comparison: None.  Findings: Mild diffuse soft tissue swelling.  No soft tissue gas. No radio-opaque foreign body.    No acute osseous abnormality.  IMPRESSION: Soft tissue swelling only.   Original Report Authenticated By: Jeronimo Greaves, M.D.    Assessment and Plan: I agree with the formulated Assessment and Plan with the following changes:   1. Left hand cellulitis, abscess, sepsis - Surgical correction as noted in their notes, drain in place, bandage to be removed in 48 hours post op - Clindamycin IV, can convert to PO once tolerating diet.  - IVF with NS while not eating - Pain control - Wound culture pending - Will need good wound care follow up  2. Non healing Foot ulcers - Follows with wound care center, will call to update them re: patient status.   3. DM 2, poorly controlled with neurovascular complications - Last A1C in March elevated - Restart home  lantus, will need uptitration considering A1C  4. Diarrhea without abdominal pain - Checking Cdiff on next stool given chronic outpatient antibiotics.   Other issues per resident note.   Inez Catalina, MD 5/22/201411:47 AM

## 2012-08-29 NOTE — Progress Notes (Signed)
Utilization Review completed.  Lakeria Starkman RN CM  

## 2012-08-30 ENCOUNTER — Encounter (HOSPITAL_COMMUNITY): Payer: Self-pay | Admitting: Certified Registered Nurse Anesthetist

## 2012-08-30 ENCOUNTER — Encounter (HOSPITAL_COMMUNITY)
Admission: EM | Disposition: A | Discharge status: Home / Self Care | Payer: Self-pay | Source: Home / Self Care | Attending: Internal Medicine

## 2012-08-30 ENCOUNTER — Inpatient Hospital Stay (HOSPITAL_COMMUNITY): Payer: Medicaid Other | Admitting: Certified Registered Nurse Anesthetist

## 2012-08-30 ENCOUNTER — Inpatient Hospital Stay (HOSPITAL_COMMUNITY): Payer: Medicaid Other

## 2012-08-30 DIAGNOSIS — L97509 Non-pressure chronic ulcer of other part of unspecified foot with unspecified severity: Secondary | ICD-10-CM

## 2012-08-30 DIAGNOSIS — L03119 Cellulitis of unspecified part of limb: Principal | ICD-10-CM

## 2012-08-30 DIAGNOSIS — E1169 Type 2 diabetes mellitus with other specified complication: Secondary | ICD-10-CM

## 2012-08-30 DIAGNOSIS — I10 Essential (primary) hypertension: Secondary | ICD-10-CM

## 2012-08-30 DIAGNOSIS — F17200 Nicotine dependence, unspecified, uncomplicated: Secondary | ICD-10-CM

## 2012-08-30 DIAGNOSIS — N182 Chronic kidney disease, stage 2 (mild): Secondary | ICD-10-CM

## 2012-08-30 HISTORY — PX: I & D EXTREMITY: SHX5045

## 2012-08-30 LAB — BASIC METABOLIC PANEL
Calcium: 8.4 mg/dL (ref 8.4–10.5)
Creatinine, Ser: 1.32 mg/dL (ref 0.50–1.35)
GFR calc Af Amer: 73 mL/min — ABNORMAL LOW (ref >90)
GFR calc non Af Amer: 63 mL/min — ABNORMAL LOW (ref >90)

## 2012-08-30 LAB — CBC
Platelets: 409 10*3/uL — ABNORMAL HIGH (ref 150–400)
RDW: 13 % (ref 11.5–15.5)
WBC: 15.2 10*3/uL — ABNORMAL HIGH (ref 4.0–10.5)

## 2012-08-30 LAB — GLUCOSE, CAPILLARY
Glucose-Capillary: 192 mg/dL — ABNORMAL HIGH (ref 70–99)
Glucose-Capillary: 212 mg/dL — ABNORMAL HIGH (ref 70–99)
Glucose-Capillary: 114 mg/dL — ABNORMAL HIGH (ref 70–99)
Glucose-Capillary: 128 mg/dL — ABNORMAL HIGH (ref 70–99)

## 2012-08-30 LAB — POCT I-STAT, CHEM 8
Calcium, Ion: 1.11 mmol/L — ABNORMAL LOW (ref 1.12–1.23)
Chloride: 102 mEq/L (ref 96–112)
HCT: 41 % (ref 39.0–52.0)
TCO2: 31 mmol/L (ref 0–100)

## 2012-08-30 SURGERY — IRRIGATION AND DEBRIDEMENT EXTREMITY
Anesthesia: General | Site: Hand | Laterality: Left | Wound class: Dirty or Infected

## 2012-08-30 MED ORDER — HYDROMORPHONE HCL PF 1 MG/ML IJ SOLN
INTRAMUSCULAR | Status: AC
  Administered 2012-08-30: 0.5 mg via INTRAVENOUS
  Filled 2012-08-30: qty 1

## 2012-08-30 MED ORDER — MIDAZOLAM HCL 5 MG/5ML IJ SOLN
INTRAMUSCULAR | Status: DC | PRN
  Administered 2012-08-30: 2 mg via INTRAVENOUS

## 2012-08-30 MED ORDER — CLINDAMYCIN PHOSPHATE 600 MG/50ML IV SOLN
600.0000 mg | Freq: Once | INTRAVENOUS | Status: DC
  Filled 2012-08-30: qty 50

## 2012-08-30 MED ORDER — ONDANSETRON HCL 4 MG/2ML IJ SOLN
INTRAMUSCULAR | Status: DC | PRN
  Administered 2012-08-30: 4 mg via INTRAVENOUS

## 2012-08-30 MED ORDER — INSULIN GLARGINE 100 UNIT/ML ~~LOC~~ SOLN
28.0000 [IU] | Freq: Every day | SUBCUTANEOUS | Status: DC
  Administered 2012-08-30: 28 [IU] via SUBCUTANEOUS
  Filled 2012-08-30 (×2): qty 0.28

## 2012-08-30 MED ORDER — LIDOCAINE HCL 1 % IJ SOLN
INTRAMUSCULAR | Status: DC | PRN
  Administered 2012-08-30: 100 mg via INTRADERMAL

## 2012-08-30 MED ORDER — OXYCODONE HCL 5 MG PO TABS
ORAL_TABLET | ORAL | Status: AC
  Filled 2012-08-30: qty 1

## 2012-08-30 MED ORDER — OXYCODONE HCL 5 MG/5ML PO SOLN: 5.0000 mg | Freq: Once | ORAL | Status: AC | PRN

## 2012-08-30 MED ORDER — OXYCODONE HCL 5 MG PO TABS
5.0000 mg | ORAL_TABLET | Freq: Once | ORAL | Status: AC | PRN
  Administered 2012-08-30: 5 mg via ORAL

## 2012-08-30 MED ORDER — FENTANYL CITRATE 0.05 MG/ML IJ SOLN
INTRAMUSCULAR | Status: DC | PRN
  Administered 2012-08-30: 50 ug via INTRAVENOUS
  Administered 2012-08-30: 100 ug via INTRAVENOUS
  Administered 2012-08-30: 50 ug via INTRAVENOUS

## 2012-08-30 MED ORDER — HYDROMORPHONE HCL PF 1 MG/ML IJ SOLN
0.2500 mg | INTRAMUSCULAR | Status: DC | PRN
  Filled 2012-08-30: qty 1

## 2012-08-30 MED ORDER — PROPOFOL 10 MG/ML IV BOLUS
INTRAVENOUS | Status: DC | PRN
  Administered 2012-08-30: 200 mg via INTRAVENOUS

## 2012-08-30 MED ORDER — BUPIVACAINE HCL (PF) 0.25 % IJ SOLN
INTRAMUSCULAR | Status: AC
  Filled 2012-08-30: qty 30

## 2012-08-30 MED ORDER — LACTATED RINGERS IV SOLN
INTRAVENOUS | Status: DC | PRN
  Administered 2012-08-30: 17:00:00 via INTRAVENOUS

## 2012-08-30 MED ORDER — ZOLPIDEM TARTRATE 5 MG PO TABS
5.0000 mg | ORAL_TABLET | Freq: Every evening | ORAL | Status: DC | PRN
  Administered 2012-08-31 – 2012-09-02 (×4): 5 mg via ORAL
  Filled 2012-08-30 (×4): qty 1

## 2012-08-30 MED ORDER — CHLORHEXIDINE GLUCONATE 4 % EX LIQD
60.0000 mL | Freq: Once | CUTANEOUS | Status: DC
  Filled 2012-08-30: qty 60

## 2012-08-30 MED ORDER — SODIUM CHLORIDE 0.9 % IR SOLN
Status: DC | PRN
  Administered 2012-08-30: 1000 mL
  Administered 2012-08-30: 3000 mL

## 2012-08-30 MED ORDER — CLINDAMYCIN PHOSPHATE 600 MG/50ML IV SOLN
600.0000 mg | INTRAVENOUS | Status: DC
  Filled 2012-08-30: qty 50

## 2012-08-30 MED ORDER — PROMETHAZINE HCL 25 MG/ML IJ SOLN
6.2500 mg | INTRAMUSCULAR | Status: DC | PRN
  Filled 2012-08-30: qty 1

## 2012-08-30 SURGICAL SUPPLY — 59 items
BANDAGE CONFORM 2  STR LF (GAUZE/BANDAGES/DRESSINGS) IMPLANT
BANDAGE ELASTIC 3 VELCRO ST LF (GAUZE/BANDAGES/DRESSINGS) ×2 IMPLANT
BANDAGE ELASTIC 4 VELCRO ST LF (GAUZE/BANDAGES/DRESSINGS) ×2 IMPLANT
BANDAGE GAUZE ELAST BULKY 4 IN (GAUZE/BANDAGES/DRESSINGS) ×2 IMPLANT
BNDG CMPR 9X4 STRL LF SNTH (GAUZE/BANDAGES/DRESSINGS) ×1
BNDG COHESIVE 1X5 TAN STRL LF (GAUZE/BANDAGES/DRESSINGS) IMPLANT
BNDG ESMARK 4X9 LF (GAUZE/BANDAGES/DRESSINGS) ×2 IMPLANT
CLOTH BEACON ORANGE TIMEOUT ST (SAFETY) ×2 IMPLANT
CORDS BIPOLAR (ELECTRODE) ×2 IMPLANT
COVER SURGICAL LIGHT HANDLE (MISCELLANEOUS) ×2 IMPLANT
CUFF TOURNIQUET SINGLE 18IN (TOURNIQUET CUFF) ×2 IMPLANT
CUFF TOURNIQUET SINGLE 24IN (TOURNIQUET CUFF) IMPLANT
DRAIN PENROSE 1/4X12 LTX STRL (WOUND CARE) IMPLANT
DRAPE SURG 17X23 STRL (DRAPES) ×2 IMPLANT
DRSG ADAPTIC 3X8 NADH LF (GAUZE/BANDAGES/DRESSINGS) ×2 IMPLANT
DRSG EMULSION OIL 3X3 NADH (GAUZE/BANDAGES/DRESSINGS) ×1 IMPLANT
ELECT REM PT RETURN 9FT ADLT (ELECTROSURGICAL)
ELECTRODE REM PT RTRN 9FT ADLT (ELECTROSURGICAL) IMPLANT
FLUID NSS /IRRIG 3000 ML XXX (IV SOLUTION) ×1 IMPLANT
GAUZE XEROFORM 1X8 LF (GAUZE/BANDAGES/DRESSINGS) ×2 IMPLANT
GAUZE XEROFORM 5X9 LF (GAUZE/BANDAGES/DRESSINGS) IMPLANT
GLOVE BIOGEL PI IND STRL 8.5 (GLOVE) ×1 IMPLANT
GLOVE BIOGEL PI INDICATOR 8.5 (GLOVE) ×1
GLOVE ECLIPSE 6.5 STRL STRAW (GLOVE) ×1 IMPLANT
GLOVE SURG ORTHO 8.0 STRL STRW (GLOVE) ×2 IMPLANT
GOWN PREVENTION PLUS XLARGE (GOWN DISPOSABLE) ×2 IMPLANT
GOWN STRL NON-REIN LRG LVL3 (GOWN DISPOSABLE) ×6 IMPLANT
HANDPIECE INTERPULSE COAX TIP (DISPOSABLE)
KIT BASIN OR (CUSTOM PROCEDURE TRAY) ×2 IMPLANT
KIT ROOM TURNOVER OR (KITS) ×2 IMPLANT
MANIFOLD NEPTUNE II (INSTRUMENTS) ×2 IMPLANT
NDL HYPO 25GX1X1/2 BEV (NEEDLE) IMPLANT
NEEDLE HYPO 25GX1X1/2 BEV (NEEDLE) IMPLANT
NS IRRIG 1000ML POUR BTL (IV SOLUTION) ×2 IMPLANT
PACK ORTHO EXTREMITY (CUSTOM PROCEDURE TRAY) ×2 IMPLANT
PAD ARMBOARD 7.5X6 YLW CONV (MISCELLANEOUS) ×4 IMPLANT
PAD CAST 4YDX4 CTTN HI CHSV (CAST SUPPLIES) ×1 IMPLANT
PADDING CAST ABS 4INX4YD NS (CAST SUPPLIES) ×1
PADDING CAST ABS COTTON 4X4 ST (CAST SUPPLIES) IMPLANT
PADDING CAST COTTON 4X4 STRL (CAST SUPPLIES) ×2
SET CYSTO W/LG BORE CLAMP LF (SET/KITS/TRAYS/PACK) ×1 IMPLANT
SET HNDPC FAN SPRY TIP SCT (DISPOSABLE) IMPLANT
SOAP 2 % CHG 4 OZ (WOUND CARE) ×2 IMPLANT
SPLINT FIBERGLASS 3X12 (CAST SUPPLIES) ×1 IMPLANT
SPONGE GAUZE 4X4 12PLY (GAUZE/BANDAGES/DRESSINGS) ×2 IMPLANT
SPONGE LAP 18X18 X RAY DECT (DISPOSABLE) ×2 IMPLANT
SPONGE LAP 4X18 X RAY DECT (DISPOSABLE) ×2 IMPLANT
SUCTION FRAZIER TIP 10 FR DISP (SUCTIONS) ×2 IMPLANT
SUT ETHILON 4 0 PS 2 18 (SUTURE) IMPLANT
SUT ETHILON 5 0 P 3 18 (SUTURE) ×1
SUT NYLON ETHILON 5-0 P-3 1X18 (SUTURE) ×1 IMPLANT
SYR CONTROL 10ML LL (SYRINGE) IMPLANT
TOWEL OR 17X24 6PK STRL BLUE (TOWEL DISPOSABLE) ×2 IMPLANT
TOWEL OR 17X26 10 PK STRL BLUE (TOWEL DISPOSABLE) ×2 IMPLANT
TUBE ANAEROBIC SPECIMEN COL (MISCELLANEOUS) IMPLANT
TUBE CONNECTING 12X1/4 (SUCTIONS) ×3 IMPLANT
UNDERPAD 30X30 INCONTINENT (UNDERPADS AND DIAPERS) ×2 IMPLANT
WATER STERILE IRR 1000ML POUR (IV SOLUTION) ×1 IMPLANT
YANKAUER SUCT BULB TIP NO VENT (SUCTIONS) ×2 IMPLANT

## 2012-08-30 NOTE — Anesthesia Procedure Notes (Signed)
Procedure Name: LMA Insertion Date/Time: 08/30/2012 5:23 PM Performed by: Angelica Pou Pre-anesthesia Checklist: Patient identified, Timeout performed, Emergency Drugs available, Suction available and Patient being monitored Patient Re-evaluated:Patient Re-evaluated prior to inductionOxygen Delivery Method: Circle system utilized Preoxygenation: Pre-oxygenation with 100% oxygen Intubation Type: IV induction Ventilation: Mask ventilation without difficulty LMA: LMA inserted LMA Size: 5.0 Number of attempts: 1 Placement Confirmation: positive ETCO2 and breath sounds checked- equal and bilateral Tube secured with: Tape Dental Injury: Teeth and Oropharynx as per pre-operative assessment

## 2012-08-30 NOTE — Progress Notes (Signed)
PLAN: OK TO GO HOME TOMORROW ORAL ANTIBIOTICS WILL NEED TO F/U IN OFFICE ON 09/04/2012 KEEP SPLINT CLEAN AND DRY NO USE OF LEFT HAND

## 2012-08-30 NOTE — Transfer of Care (Signed)
Immediate Anesthesia Transfer of Care Note  Patient: [name]  Procedure(s) Performed: Procedure(s): IRRIGATION AND DEBRIDEMENT EXTREMITY (Left)  Patient Location: PACU  Anesthesia Type:General  Level of Consciousness: awake, oriented, sedated and patient cooperative  Airway & Oxygen Therapy: Patient Spontanous Breathing and Patient connected to nasal cannula oxygen  Post-op Assessment: Report given to PACU RN, Post -op Vital signs reviewed and stable and Patient moving all extremities  Post vital signs: Reviewed and stable  Complications: No apparent anesthesia complications

## 2012-08-30 NOTE — Progress Notes (Signed)
Inpatient Diabetes Program Recommendations  AACE/ADA: New Consensus Statement on Inpatient Glycemic Control (2013)  Target Ranges:  Prepandial:   less than 140 mg/dL      Peak postprandial:   less than 180 mg/dL (1-2 hours)      Critically ill patients:  140 - 180 mg/dL   Last Z6X=09.6 June 21, 2012 Consider switching patient to Lantus and Novolog since he has Medicaid. He can use the Novolog when he eats. Thank you  Piedad Climes BSN, RN,CDE Inpatient Diabetes Coordinator [phone] (team pager)

## 2012-08-30 NOTE — Op Note (Signed)
NAMEHERCHEL, HOPKIN NO.:  [id number]  MEDICAL RECORD NO.:  [id number]  LOCATION:  5530                         FACILITY:  [facility]  PHYSICIAN:  Madelynn Done, MD  DATE OF BIRTH:  09-Jun-1965  DATE OF PROCEDURE:  08/30/2012 DATE OF DISCHARGE:                              OPERATIVE REPORT   PREOPERATIVE DIAGNOSIS:  Left thumb deep space abscess.  POSTOPERATIVE DIAGNOSIS:  Left thumb deep space abscess.  ATTENDING PHYSICIAN:  Sharma Covert IV, MD, who was scrubbed and present for the entire procedure.  ASSISTANT SURGEON:  None.  ANESTHESIA:  General via LMA.  SURGICAL PROCEDURE:  Incision and drainage of the left thumb deep abscess palmar space abscess.  SURGICAL INDICATIONS:  Mr. Baston is a right-hand dominant, diabetic with history of multiple infections.  He had previously undergone I and D on Aug 28, 2012.  He returned today to the operating room for persistent infection.  Risks, benefits, and alternatives were discussed in detail with the patient and signed informed consent was obtained.  Risks include, but not limited to bleeding, infection, persistent infection, loss of motion of wrist and digits, and need for further surgical intervention.  DESCRIPTION OF PROCEDURE:  The patient was properly identified in the preoperative holding area and marked with a permanent mark, made on the left thumb to indicate the correct operative site.  The patient was then brought back to the operating room and placed supine on the anesthesia room table where  general anesthesia was administered.  The patient tolerated this well.  The patient received IV clindamycin prior to any skin incision.  A well-padded tourniquet was placed on the left brachium and sealed with 1000 drape.  Left upper extremity was then prepped and draped in normal sterile fashion.  Time-out was called, correct side was identified, and procedure then begun.  Attention was then turned to  the left hand where the previous sutures were then removed.  Small counter incision made in length and in the palm for an outlet for the irrigation.  Drainage of the abscess area and palmar bursa was then carried out.  Copious irrigation done.  A 3 L were then run through the hand.  The patient's wound looked lot better today, but he still had some mild purulence at the beginning of the procedure.  Copious wound irrigation done throughout.  Following this, excisional debridement of skin, subcutaneous tissue, was done of the devitalized tissue.  After excisional debridement, the wound was then loosely reapproximated with two 4-0 Prolene sutures.  Adaptic dressing and sterile compressive bandage was applied.  The patient tolerated the procedure well and returned to recovery room in good condition.  Hands being placed in a well-padded volar splint.  POSTPROCEDURE PLAN:  The patient will be admitted back to the Internal Medicine Service.  Will be okay to go home in the morning.  Need to follow up in the office on Tuesday, keep the splint on at all times. Oral antibiotics as directed by the primary service.     Madelynn Done, MD     FWO/MEDQ  D:  08/30/2012  T:  08/30/2012  Job:  [id number] 

## 2012-08-30 NOTE — Brief Op Note (Signed)
08/28/2012 - 08/30/2012  5:21 PM  PATIENT:  [name]  47 y.o. male  PRE-OPERATIVE DIAGNOSIS:  infected left hand  POST-OPERATIVE DIAGNOSIS:  Infected Left hand   PROCEDURE:  Procedure(s): IRRIGATION AND DEBRIDEMENT EXTREMITY (Left)  SURGEON:  Surgeon(s) and Role:    * Sharma Covert, MD - Primary  PHYSICIAN ASSISTANT: none  ASSISTANTS: none   ANESTHESIA:   general  EBL:  Total I/O In: 360 [P.O.:360] Out: 1200 [Urine:1200]  BLOOD ADMINISTERED:none  DRAINS: none   LOCAL MEDICATIONS USED:  NONE  SPECIMEN:  No Specimen  DISPOSITION OF SPECIMEN:  N/A  COUNTS:  YES  TOURNIQUET:    DICTATION: .161096  PLAN OF CARE: Admit to inpatient   PATIENT DISPOSITION:  PACU - hemodynamically stable.   Delay start of Pharmacological VTE agent (>24hrs) due to surgical blood loss or risk of bleeding: not applicable

## 2012-08-30 NOTE — Progress Notes (Signed)
Internal Medicine Teaching Service Attending Note Date: 08/30/2012  Patient name: [name]  Medical record number: [id number]  Date of birth: 07-03-1965    This patient has been seen and discussed with the house staff. Please see their note for complete details. I concur with their findings.   Xray right foot today, Orthopedic consultation.  Surgical evaluation of the left hand today by Dr. Melvyn Novas.   [name] 08/30/2012, 4:42 PM

## 2012-08-30 NOTE — Progress Notes (Signed)
Subjective:    Interval Events:  Patient feels well but is concerned about his right foot after what the hand surgeon said to him. He would like an orthopedic surgeon to provide an opinion on his right foot. The odor is quite concerning to the patient. He denies fevers, chills, and abdominal pain.  I spoke with the patient's wound clinic nurse over the phone yesterday. They are aware of the exposed bone, foul smell, and need for debridement. He was scheduled for sharp debridement today. They are okay with him waiting one more week for debridement followed by hyperbaric treatment. Obviously, if the patient deteriorates, they recommend amputation.     Objective:    Vital Signs:   Temp:  [98.6 F-100.2 F] 98.9 F (37.2 C) (05/23 0504) Pulse Rate:  [105-120] 108 (05/23 0504) Resp:  [16-18] 18 (05/23 0504) BP: (131-149)/(78-94) 149/84 mmHg (05/23 0504) SpO2:  [99 %-100 %] 100 % (05/23 0504)   Weights: 24-hour Weight change: 10 lb 15.1 oz (4.963 kg)  Filed Weights   08/28/12 2325 08/29/12 0500 08/30/12 0510  Weight: 191 lb (86.637 kg) 190 lb (86.183 kg) 201 lb 15.1 oz (91.6 kg)    Intake/Output:  No intake or output data in the 24 hours ending 08/30/12 0706    Last BM Date: 08/28/12   Physical Exam: GENERAL: alert and oriented; resting comfortably in bed and in no distress LUNGS: clear to auscultation bilaterally, normal work of breathing HEART: normal rate; regular rhythm; normal S1 and S2, no S3 or S4 appreciated; no murmurs, rubs, or clicks ABDOMEN: soft, non-tender, normal bowel sounds, no masses palpated    Labs: Basic Metabolic Panel: Lab 08/28/12 1405 08/28/12 1537 08/29/12 0540 08/30/12 0550  NA 133* 139 129* 131*  K 3.8 3.6 4.7 4.0  CL 92* 102 93* 96  CO2 29  --  25 27  GLUCOSE 263* 92 340* 257*  BUN 13 15 14 16   CREATININE 1.33 1.20 1.31 1.32  CALCIUM 9.1  --  8.1* 8.4    CBC: Lab 08/28/12 1405 08/28/12 1537 08/29/12 0540 08/30/12 0550  WBC  21.6*  --  17.4* 15.2*  NEUTROABS 18.9*  --   --   --   HGB 10.9* 13.9 9.5* 9.2*  HCT 32.1* 41.0 29.2* 28.2*  MCV 88.2  --  88.5 89.0  PLT 412*  --  369 409*    CBG: Lab 08/28/12 2248 08/29/12 0738 08/29/12 1233 08/29/12 1720 08/29/12 2116  GLUCAP 317* 303* 220* 293* 168*    Microbiology: Results for orders placed during the hospital encounter of 08/28/12  CULTURE, BLOOD (ROUTINE X 2)     Status: None   Collection Time    08/28/12  8:45 PM      Result Value Range Status   Specimen Description BLOOD ARM RIGHT   Final   Special Requests BOTTLES DRAWN AEROBIC AND ANAEROBIC 10CC   Final   Culture  Setup Time 08/29/2012 05:08   Final   Culture     Final   Value:        BLOOD CULTURE RECEIVED NO GROWTH TO DATE CULTURE WILL BE HELD FOR 5 DAYS BEFORE ISSUING A FINAL NEGATIVE REPORT   Report Status PENDING   Incomplete  CULTURE, BLOOD (ROUTINE X 2)     Status: None   Collection Time    08/28/12  8:55 PM      Result Value Range Status   Specimen Description BLOOD ARM LEFT   Final  Special Requests BOTTLES DRAWN AEROBIC AND ANAEROBIC 10CC   Final   Culture  Setup Time 08/29/2012 05:08   Final   Culture     Final   Value:        BLOOD CULTURE RECEIVED NO GROWTH TO DATE CULTURE WILL BE HELD FOR 5 DAYS BEFORE ISSUING A FINAL NEGATIVE REPORT   Report Status PENDING   Incomplete  ANAEROBIC CULTURE     Status: None   Collection Time    08/28/12  9:50 PM      Result Value Range Status   Specimen Description ABSCESS HAND LEFT   Final   Special Requests PATIENT ON FOLLOWING CLEOCIN   Final   Gram Stain PENDING   Incomplete   Culture     Final   Value: NO ANAEROBES ISOLATED; CULTURE IN PROGRESS FOR 5 DAYS   Report Status PENDING   Incomplete  CULTURE, ROUTINE-ABSCESS     Status: None   Collection Time    08/28/12  9:50 PM      Result Value Range Status   Specimen Description ABSCESS HAND LEFT   Final   Special Requests PATIENT ON FOLLOWING CLEOCIN   Final   Gram Stain     Final    Value: FEW WBC PRESENT,BOTH PMN AND MONONUCLEAR     NO SQUAMOUS EPITHELIAL CELLS SEEN     FEW GRAM POSITIVE COCCI     IN PAIRS   Culture PENDING   Incomplete   Report Status PENDING   Incomplete    Imaging:  No new imaging    Medications:    Infusions:     Scheduled Medications: . clindamycin  450 mg Oral Q8H  . collagenase   Topical UD  . FLUoxetine  10 mg Oral Daily  . gabapentin  300 mg Oral TID  . heparin  5,000 Units Subcutaneous Q8H  . insulin aspart  0-20 Units Subcutaneous TID WC  . insulin aspart  0-5 Units Subcutaneous QHS  . insulin glargine  25 Units Subcutaneous QHS  . lisinopril  40 mg Oral Daily  . sodium chloride  3 mL Intravenous Q12H     PRN Medications: ondansetron (ZOFRAN) IV, ondansetron, oxyCODONE-acetaminophen    Assessment/ Plan:    1.   Cellulitis and abscess of hand:  Infected blister, sustained while doing yard work.  Gram stain of operative sample with gram positive cocci in pairs, consistent with Streptococcus pyogenes.  We'll treat with clindamycin until MRSA ruled out with speciation.  Surgery taking patient to OR today for repeat I&D. - Clindamycin 400 mg PO TID (day 2/10) - Oxycodone-acetaminophen 5-325 mg every 4 hours when necessary  2.   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  Reports excellent compliance since he received medicaid.  Hemoblogin A1c still high at 13.7 though.  Urine albumin to creatinine ratio elevated at 100.2 on 06/21/2012. Patient on lisinopril 40 mg daily. Patient also takes gabapentin 300 mg 3 times a day for diabetic neuropathy. - Increase Lantus from 25 to 28 units each bedtime - Continue resistant scale correctional insulin aspart with meals and at bedtime - Continue gabapentin 300 mg 3 times a day  3.   Bilateral, diabetic, foot ulcers:  Has been on doxycycline for several weeks, prescribed by wound clinic.  Wounds continue to be  non-healing, likely because of poor circulation and diabetes.  Will hold doxycycline and treat with clindamycin as noted above. - Collagenase  per wound consult - Follow up with wound clinic, appt on 5/30 for extensive debridement - Orthopedic surgery consultation - Plain radiography of right foot  4.   Hypertension:  At home he takes lisinopril 40 mg daily. This was held admission. Blood pressures now elevated. Will restart this today. - Continue lisinopril 40 mg daily  5.   Diarrhea:  Resolved. C diff PCR not collected.  Order canceled.  Cancel enteric precautions. - Cancel enteric precautions  6.   Acute bronchitis:  Reports 2 days of cough without dyspnea.  Some sputum production with cough.  No evidence of pneumonia on chest xray and lung auscultation is normal.  Will treat symptomatically since this is almost certainly viral in nature.  7.   Depression:  Stable. At home he takes fluoxetine 10 mg daily. This was held admission. We will restart this now. - Continue fluoxetine 10 mg daily  8.   Prophylaxis:  Heparin 5,000 units [place] TID for VTE  9.   Disposition:  OPC patient.  Expect discharge today or tomorrow.  OPC follow up established.    Length of Stay: 2 days   Signed by:  Dorthula Rue. Earlene Plater, MD PGY-I, Internal Medicine Pager [phone]  08/30/2012, 12:23 PM

## 2012-08-30 NOTE — Preoperative (Signed)
Beta Blockers   Reason not to administer Beta Blockers:Not Applicable 

## 2012-08-30 NOTE — Anesthesia Preprocedure Evaluation (Addendum)
Anesthesia Evaluation  Patient identified by MRN, date of birth, ID band Patient awake    Reviewed: Allergy & Precautions, H&P , NPO status , Patient's Chart, lab work & pertinent test results  History of Anesthesia Complications Negative for: history of anesthetic complications  Airway Mallampati: II TM Distance: >3 FB Neck ROM: Full    Dental  (+) Teeth Intact and Dental Advisory Given   Pulmonary Current Smoker,    Pulmonary exam normal       Cardiovascular hypertension, + Peripheral Vascular Disease + dysrhythmias     Neuro/Psych Depression    GI/Hepatic PUD, GERD-  Medicated and Controlled,(+)     substance abuse  alcohol use, cocaine use and marijuana use,   Endo/Other  diabetes, Type 2, Insulin Dependent  Renal/GU Renal InsufficiencyRenal disease     Musculoskeletal  (+) Arthritis -, Osteoarthritis,    Abdominal   Peds  Hematology   Anesthesia Other Findings   Reproductive/Obstetrics                           Anesthesia Physical Anesthesia Plan  ASA: III  Anesthesia Plan: General   Post-op Pain Management:    Induction: Intravenous  Airway Management Planned: LMA  Additional Equipment:   Intra-op Plan:   Post-operative Plan: Extubation in OR  Informed Consent: I have reviewed the patients History and Physical, chart, labs and discussed the procedure including the risks, benefits and alternatives for the proposed anesthesia with the patient or authorized representative who has indicated his/her understanding and acceptance.   Dental advisory given  Plan Discussed with: CRNA, Anesthesiologist and Surgeon  Anesthesia Plan Comments:         Anesthesia Quick Evaluation

## 2012-08-30 NOTE — Progress Notes (Signed)
Dressing changed Still with draining pus Will need repeat i and d today in or Will plan for or this afternoon Not able to go home Feet have rather strong foul odor to them, have mentioned to primary service  Continue iv antibiotics

## 2012-08-31 DIAGNOSIS — F329 Major depressive disorder, single episode, unspecified: Secondary | ICD-10-CM

## 2012-08-31 LAB — CULTURE, ROUTINE-ABSCESS

## 2012-08-31 LAB — GLUCOSE, CAPILLARY
Glucose-Capillary: 196 mg/dL — ABNORMAL HIGH (ref 70–99)
Glucose-Capillary: 153 mg/dL — ABNORMAL HIGH (ref 70–99)

## 2012-08-31 MED ORDER — INSULIN GLARGINE 100 UNIT/ML ~~LOC~~ SOLN
30.0000 [IU] | Freq: Every day | SUBCUTANEOUS | Status: DC
  Administered 2012-08-31 – 2012-09-02 (×3): 30 [IU] via SUBCUTANEOUS
  Filled 2012-08-31 (×5): qty 0.3

## 2012-08-31 MED ORDER — CEPHALEXIN 500 MG PO CAPS
500.0000 mg | ORAL_CAPSULE | Freq: Four times a day (QID) | ORAL | Status: DC
  Administered 2012-08-31 (×3): 500 mg via ORAL
  Filled 2012-08-31 (×8): qty 1

## 2012-08-31 NOTE — Progress Notes (Signed)
Subjective: No acute events overnight. POD#1 s/p I&D of left thumb abscess.  Pain well managed. No complaints.  Objective: Vital signs in last 24 hours: Filed Vitals:   08/30/12 1910 08/30/12 1924 08/30/12 2117 08/31/12 0608  BP: 143/65 146/82 163/102 129/83  Pulse: 96 95 104 106  Temp: 99.4 F (37.4 C) 99 F (37.2 C) 98.8 F (37.1 C) 99 F (37.2 C)  TempSrc:  Oral Oral Oral  Resp: 16 16 16 14   Height:      Weight:    202 lb 13.2 oz (92 kg)  SpO2: 100% 99% 99% 99%   Weight change: 14.1 oz (0.4 kg)  Intake/Output Summary (Last 24 hours) at 08/31/12 1128 Last data filed at 08/31/12 1109  Gross per 24 hour  Intake    600 ml  Output   2550 ml  Net  -1950 ml   GENERAL: alert and oriented; resting comfortably in chair comfortably and in no distress  LUNGS: clear to auscultation bilaterally, normal work of breathing  HEART: normal rate; regular rhythm; normal S1 and S2, no S3 or S4 appreciated; no murmurs, rubs, or clicks  ABDOMEN: soft, non-tender, normal bowel sounds, no masses palpated  Extremities: right foot with purulent, malodorous drainage at base of right great toe amputation; left foot s/p transmetatarsal amputation without drainage   Lab Results: Basic Metabolic Panel:  Recent Labs Lab 08/29/12 0540 08/30/12 0550  NA 129* 131*  K 4.7 4.0  CL 93* 96  CO2 25 27  GLUCOSE 340* 257*  BUN 14 16  CREATININE 1.31 1.32  CALCIUM 8.1* 8.4   Liver Function Tests:  Recent Labs Lab 08/28/12 1405 08/29/12 0540  AST 15 10  ALT 13 10  ALKPHOS 115 104  BILITOT 0.3 0.4  PROT 7.7 6.7  ALBUMIN 3.1* 2.4*    Recent Labs Lab 08/28/12 1405  LIPASE 14   CBC:  Recent Labs Lab 08/28/12 1405  08/29/12 0540 08/30/12 0550  WBC 21.6*  --  17.4* 15.2*  NEUTROABS 18.9*  --   --   --   HGB 10.9*  < > 9.5* 9.2*  HCT 32.1*  < > 29.2* 28.2*  MCV 88.2  --  88.5 89.0  PLT 412*  --  369 409*  < > = values in this interval not displayed.  CBG:  Recent Labs Lab  08/30/12 0759 08/30/12 1142 08/30/12 1621 08/30/12 1809 08/30/12 2111 08/31/12 0751  GLUCAP 192* 212* 114* 128* 135* 196*   Urine Drug Screen: Drugs of Abuse     Component Value Date/Time   LABOPIA NONE DETECTED 07/27/2012 0153   COCAINSCRNUR NONE DETECTED 07/27/2012 0153   COCAINSCRNUR NEG 09/22/2008 2125   LABBENZ NONE DETECTED 07/27/2012 0153   LABBENZ NEG 09/22/2008 2125   AMPHETMU NONE DETECTED 07/27/2012 0153   AMPHETMU NEG 09/22/2008 2125   THCU POSITIVE* 07/27/2012 0153   LABBARB NONE DETECTED 07/27/2012 0153    Urinalysis:  Recent Labs Lab 08/28/12 2033  COLORURINE YELLOW  LABSPEC 1.034*  PHURINE 7.0  GLUCOSEU >1000*  HGBUR NEGATIVE  BILIRUBINUR SMALL*  KETONESUR 15*  PROTEINUR 100*  UROBILINOGEN 0.2  NITRITE NEGATIVE  LEUKOCYTESUR NEGATIVE    Micro Results: Recent Results (from the past 240 hour(s))  CULTURE, BLOOD (ROUTINE X 2)     Status: None   Collection Time    08/28/12  8:45 PM      Result Value Range Status   Specimen Description BLOOD ARM RIGHT   Final   Special Requests BOTTLES  DRAWN AEROBIC AND ANAEROBIC 10CC   Final   Culture  Setup Time 08/29/2012 05:08   Final   Culture     Final   Value:        BLOOD CULTURE RECEIVED NO GROWTH TO DATE CULTURE WILL BE HELD FOR 5 DAYS BEFORE ISSUING A FINAL NEGATIVE REPORT   Report Status PENDING   Incomplete  CULTURE, BLOOD (ROUTINE X 2)     Status: None   Collection Time    08/28/12  8:55 PM      Result Value Range Status   Specimen Description BLOOD ARM LEFT   Final   Special Requests BOTTLES DRAWN AEROBIC AND ANAEROBIC 10CC   Final   Culture  Setup Time 08/29/2012 05:08   Final   Culture     Final   Value:        BLOOD CULTURE RECEIVED NO GROWTH TO DATE CULTURE WILL BE HELD FOR 5 DAYS BEFORE ISSUING A FINAL NEGATIVE REPORT   Report Status PENDING   Incomplete  ANAEROBIC CULTURE     Status: None   Collection Time    08/28/12  9:50 PM      Result Value Range Status   Specimen Description ABSCESS HAND  LEFT   Final   Special Requests PATIENT ON FOLLOWING CLEOCIN   Final   Gram Stain PENDING   Incomplete   Culture     Final   Value: NO ANAEROBES ISOLATED; CULTURE IN PROGRESS FOR 5 DAYS   Report Status PENDING   Incomplete  CULTURE, ROUTINE-ABSCESS     Status: None   Collection Time    08/28/12  9:50 PM      Result Value Range Status   Specimen Description ABSCESS HAND LEFT   Final   Special Requests PATIENT ON FOLLOWING CLEOCIN   Final   Gram Stain     Final   Value: FEW WBC PRESENT,BOTH PMN AND MONONUCLEAR     NO SQUAMOUS EPITHELIAL CELLS SEEN     FEW GRAM POSITIVE COCCI     IN PAIRS   Culture     Final   Value: MODERATE GROUP B STREP(S.AGALACTIAE)ISOLATED     Note: TESTING AGAINST S. AGALACTIAE NOT ROUTINELY PERFORMED DUE TO PREDICTABILITY OF AMP/PEN/VAN SUSCEPTIBILITY.   Report Status PENDING   Incomplete  SURGICAL PCR SCREEN     Status: None   Collection Time    08/30/12 10:35 AM      Result Value Range Status   MRSA, PCR NEGATIVE  NEGATIVE Final   Staphylococcus aureus NEGATIVE  NEGATIVE Final   Comment:            The Xpert SA Assay (FDA     approved for NASAL specimens     in patients over 47 years of age),     is one component of     a comprehensive surveillance     program.  Test performance has     been validated by [employer] for patients greater     than or equal to 57 year old.     It is not intended     to diagnose infection nor to     guide or monitor treatment.   Studies/Results: Dg Foot Complete Right  08/30/2012   *RADIOLOGY REPORT*  Clinical Data: Right foot infection.  Previous partial amputation of the great toe.  History of diabetes.  RIGHT FOOT COMPLETE - 3+ VIEW  Comparison: Right foot radiographs 04/26/2012.  Findings: Patient is status post amputation through the base of the first proximal phalanx.  There is increased osseous resorption and erosion at the amputation site.  The first metatarsal appears stable with stable degenerative changes  of the first metatarsal phalangeal joint.  The additional toes appear normal.  There is increased soft tissue swelling medial to the first metatarsal phalangeal joint.  IMPRESSION: Increased osseous resorption/erosion of the first proximal phalanx adjacent to the prior amputation suspicious for recurrent osteomyelitis.   Original Report Authenticated By: Carey Bullocks, M.D.   Medications: I have reviewed the patient's current medications. Scheduled Meds: . clindamycin  450 mg Oral Q8H  . clindamycin  600 mg Intravenous Once  . collagenase   Topical UD  . FLUoxetine  10 mg Oral Daily  . gabapentin  300 mg Oral TID  . heparin  5,000 Units Subcutaneous Q8H  . insulin aspart  0-20 Units Subcutaneous TID WC  . insulin aspart  0-5 Units Subcutaneous QHS  . insulin glargine  28 Units Subcutaneous QHS  . lisinopril  40 mg Oral Daily  . sodium chloride  3 mL Intravenous Q12H   Continuous Infusions:  PRN Meds:.HYDROmorphone (DILAUDID) injection, ondansetron (ZOFRAN) IV, ondansetron, oxyCODONE-acetaminophen, promethazine, zolpidem  Assessment/Plan: # Cellulitis and abscess of hand: Stable. POD#1 s/p I&D; d/t infected blister, sustained while doing yard work. Gram stain revealed moderate Group B strep (s. agalactaie). Will change clinda to keflex 500mg  QID.  - Keflex as above (day 2/10 total of antibiotics)  - Oxycodone-acetaminophen 5-325 mg every 4 hours when necessary   # Bilateral, diabetic, foot ulcers: Has been on doxycycline for several weeks, prescribed by wound clinic. Wounds continue to be non-healing, likely because of poor circulation and diabetes. 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 Reports excellent compliance since he received medicaid.  Hemoblogin A1c still high at 13.7 though. Urine albumin to creatinine ratio elevated at 100.2 on 06/21/2012. Patient on lisinopril 40 mg daily. Patient also takes gabapentin 300 mg 3 times a day for diabetic neuropathy.  - Increase Lantus from 28 to 30 units each bedtime  - Continue resistant scale correctional insulin aspart with meals and at bedtime  - Continue gabapentin 300 mg 3 times a day   # Hypertension: Improved. At home on lisinopril 40 mg daily.   - Continue lisinopril 40 mg daily   # Diarrhea: Resolved  # Acute bronchitis: Improved. Reports 2 days of cough without dyspnea. Some sputum production with cough. No evidence of pneumonia on chest xray and lung auscultation is normal. Will treat symptomatically since this is almost certainly viral in nature.   # Depression: Stable. At home he takes fluoxetine 10 mg daily, which has been restarted - Continue fluoxetine 10 mg daily   # Prophylaxis: Heparin 5,000 units Addison TID for VTE   Dispo: Disposition is deferred at this time, awaiting improvement of current medical problems.  Anticipated discharge in approximately 1-2 day(s).   The patient does have a current PCP Zada Girt, Richard, MD), therefore will be requiring OPC follow-up after discharge.   The patient does not have transportation limitations that hinder transportation to clinic appointments.  .Services Needed at time of discharge: Y = Yes, Blank = No PT:   OT:   RN:  Equipment:   Other:     LOS: 3 days   [name] 08/31/2012, 11:28 AM

## 2012-08-31 NOTE — Consult Note (Signed)
Reason for Consult:Oseomylitis right foot and left foot wound Referring Physician: [name] is an 47 y.o. male.  HPI: Patient well known to Dr. Magnus [name] with history of right great toe partial amputation  a and left foot transmetatarsal amputation. reports [facility] left foot undergoing hyperbariac and debridement treatment. Report increasing odor right foot but left foot doing pretty good. Reports fevers and chills.  Past Medical History  Diagnosis Date  . Onychomycosis   . Hypertension   . Diabetic peripheral neuropathy   . Diabetic foot ulcer 12/09    left plantar area  . History of drug abuse     Cocaine and marijuana  . Exposure to trichomonas     treated empirically  . Cellulitis of left foot 03/2008    left 4th and 5th metatarsal area  . Hyperlipidemia   . 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 Ulcerative esophagitis 2007    severe, complicated with UGI bleed  . Mallory Janann August tear April 2009  . History of prolonged Q-T interval on ECG   . 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  . 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 CKD (chronic kidney disease) stage 2, GFR 60-89 ml/min     BL SCr 1.3-1.4  . Depression   . Peripheral vascular disease   . Type 2 diabetes mellitus, uncontrolled, with renal complications   . GERD (gastroesophageal reflux disease)     Past Surgical History  Procedure Laterality Date  . Toe amputation  06/2011    left; 4th toe  . Nephrectomy      L side  . I&d extremity  08/24/2011    Procedure: IRRIGATION AND DEBRIDEMENT EXTREMITY;  Surgeon: Cammy Copa, MD;  Location: [facility] OR;  Service: Orthopedics;  Laterality: Left;  . Amputation  08/24/2011    Procedure: AMPUTATION RAY;  Surgeon: Cammy Copa, MD;  Location: [facility] OR;  Service: Orthopedics;   Laterality: Left;  left fourth toe Ray Resection  . Amputation  12/05/2011    Procedure: AMPUTATION RAY;  Surgeon: Cammy Copa, MD;  Location: [facility] OR;  Service: Orthopedics;  Laterality: Left;  . I&d extremity  02/18/2012    Procedure: IRRIGATION AND DEBRIDEMENT EXTREMITY;  Surgeon: Kathryne Hitch, MD;  Location: [facility] OR;  Service: Orthopedics;  Laterality: Left;  . Amputation  02/18/2012    Procedure: AMPUTATION DIGIT;  Surgeon: Kathryne Hitch, MD;  Location: [facility] OR;  Service: Orthopedics;  Laterality: Left;  revison transmetatarsal  . I&d extremity Left 08/28/2012    Procedure: IRRIGATION AND DEBRIDEMENT EXTREMITY;  Surgeon: Sharma Covert, MD;  Location: [facility] OR;  Service: Orthopedics;  Laterality: Left;  CYSTO TUBING/IRRIGATION, LEAD HAND.    Family History  Problem Relation Age of Onset  . Diabetes type II Mother   . Pancreatic cancer Father     Social History:  reports that he has been smoking Cigarettes.  He has a 17 pack-year smoking history. He has never used smokeless tobacco. He reports that  drinks alcohol. He reports that he uses illicit drugs (Marijuana).  Allergies: No Known Allergies  Medications: I have reviewed the patient's current medications.  Results for orders placed during the hospital encounter of 08/28/12 (from the past 48 hour(s))  GLUCOSE, CAPILLARY     Status: Abnormal   Collection  Time    08/29/12 12:33 PM      Result Value Range   Glucose-Capillary 220 (*) 70 - 99 mg/dL  GLUCOSE, CAPILLARY     Status: Abnormal   Collection Time    08/29/12  5:20 PM      Result Value Range   Glucose-Capillary 293 (*) 70 - 99 mg/dL  GLUCOSE, CAPILLARY     Status: Abnormal   Collection Time    08/29/12  9:16 PM      Result Value Range   Glucose-Capillary 168 (*) 70 - 99 mg/dL  BASIC METABOLIC PANEL     Status: Abnormal   Collection Time    08/30/12  5:50 AM      Result Value Range   Sodium 131 (*) 135 - 145 mEq/L   Potassium 4.0  3.5 - 5.1 mEq/L    Chloride 96  96 - 112 mEq/L   CO2 27  19 - 32 mEq/L   Glucose, Bld 257 (*) 70 - 99 mg/dL   BUN 16  6 - 23 mg/dL   Creatinine, Ser 9.60  0.50 - 1.35 mg/dL   Calcium 8.4  8.4 - 45.4 mg/dL   GFR calc non Af Amer 63 (*) >90 mL/min   GFR calc Af Amer 73 (*) >90 mL/min   Comment:            The eGFR has been calculated     using the CKD EPI equation.     This calculation has not been     validated in all clinical     situations.     eGFR's persistently     <90 mL/min signify     possible Chronic Kidney Disease.  CBC     Status: Abnormal   Collection Time    08/30/12  5:50 AM      Result Value Range   WBC 15.2 (*) 4.0 - 10.5 K/uL   RBC 3.17 (*) 4.22 - 5.81 MIL/uL   Hemoglobin 9.2 (*) 13.0 - 17.0 g/dL   HCT 09.8 (*) 11.9 - 14.7 %   MCV 89.0  78.0 - 100.0 fL   MCH 29.0  26.0 - 34.0 pg   MCHC 32.6  30.0 - 36.0 g/dL   RDW 82.9  56.2 - 13.0 %   Platelets 409 (*) 150 - 400 K/uL  GLUCOSE, CAPILLARY     Status: Abnormal   Collection Time    08/30/12  7:59 AM      Result Value Range   Glucose-Capillary 192 (*) 70 - 99 mg/dL  SURGICAL PCR SCREEN     Status: None   Collection Time    08/30/12 10:35 AM      Result Value Range   MRSA, PCR NEGATIVE  NEGATIVE   Staphylococcus aureus NEGATIVE  NEGATIVE   Comment:            The Xpert SA Assay (FDA     approved for NASAL specimens     in patients over 68 years of age),     is one component of     a comprehensive surveillance     program.  Test performance has     been validated by [employer] for patients greater     than or equal to 28 year old.     It is not intended     to diagnose infection nor to     guide or monitor treatment.  GLUCOSE, CAPILLARY  Status: Abnormal   Collection Time    08/30/12 11:42 AM      Result Value Range   Glucose-Capillary 212 (*) 70 - 99 mg/dL  GLUCOSE, CAPILLARY     Status: Abnormal   Collection Time    08/30/12  4:21 PM      Result Value Range   Glucose-Capillary 114 (*) 70 - 99 mg/dL   GLUCOSE, CAPILLARY     Status: Abnormal   Collection Time    08/30/12  6:09 PM      Result Value Range   Glucose-Capillary 128 (*) 70 - 99 mg/dL   Comment 1 Documented in Chart     Comment 2 Notify RN    GLUCOSE, CAPILLARY     Status: Abnormal   Collection Time    08/30/12  9:11 PM      Result Value Range   Glucose-Capillary 135 (*) 70 - 99 mg/dL  GLUCOSE, CAPILLARY     Status: Abnormal   Collection Time    08/31/12  7:51 AM      Result Value Range   Glucose-Capillary 196 (*) 70 - 99 mg/dL    Dg Foot Complete Right  08/30/2012   *RADIOLOGY REPORT*  Clinical Data: Right foot infection.  Previous partial amputation of the great toe.  History of diabetes.  RIGHT FOOT COMPLETE - 3+ VIEW  Comparison: Right foot radiographs 04/26/2012.  Findings: Patient is status post amputation through the base of the first proximal phalanx.  There is increased osseous resorption and erosion at the amputation site.  The first metatarsal appears stable with stable degenerative changes of the first metatarsal phalangeal joint.  The additional toes appear normal.  There is increased soft tissue swelling medial to the first metatarsal phalangeal joint.  IMPRESSION: Increased osseous resorption/erosion of the first proximal phalanx adjacent to the prior amputation suspicious for recurrent osteomyelitis.   Original Report Authenticated By: Carey Bullocks, M.D.    ROS Blood pressure 129/83, pulse 106, temperature 99 F (37.2 C), temperature source Oral, resp. rate 14, height 5\' 8"  (1.727 m), weight 92 kg (202 lb 13.2 oz), SpO2 99.00%. Physical Exam  Constitutional: He is oriented to person, place, and time. He appears well-developed and well-nourished.  Eyes: EOM are normal.  Cardiovascular: Intact distal pulses.   Tachycardia  Respiratory: Effort normal.  Musculoskeletal:  Left foot s/p transmetatarsal amputation mid old incision eschar present no drainage.  Right foot s/p great toe amputation with purulent   drainage and malodor, Bilateral feet without erythema or significant edema.  Neurological: He is alert and oriented to person, place, and time.  Sensation bilateral feet intact to light touch  Psychiatric: He has a normal mood and affect.    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 Lajoyce Corners or Dr. Magnus [name] for right 1st ray amputation and revision left foot transmetatarsal amputation.  Wet to dressing applied both feet Npo after midnight Consent to be obtained for surgery  Richardean Canal 08/31/2012, 10:04 AM

## 2012-08-31 NOTE — Consult Note (Signed)
I have seen and examined Mr. Berquist and agree fully with Rexene Edison, PA-C's note.  We will proceed to the OR tomorrow for a right foot 1st ray amputation and a revision trans met amputation of the left foot

## 2012-09-01 ENCOUNTER — Encounter (HOSPITAL_COMMUNITY): Payer: Self-pay | Admitting: Anesthesiology

## 2012-09-01 ENCOUNTER — Encounter (HOSPITAL_COMMUNITY)
Admission: EM | Disposition: A | Discharge status: Home / Self Care | Payer: Self-pay | Source: Home / Self Care | Attending: Internal Medicine

## 2012-09-01 ENCOUNTER — Inpatient Hospital Stay (HOSPITAL_COMMUNITY): Payer: Medicaid Other | Admitting: Anesthesiology

## 2012-09-01 DIAGNOSIS — E1159 Type 2 diabetes mellitus with other circulatory complications: Secondary | ICD-10-CM

## 2012-09-01 HISTORY — PX: AMPUTATION: SHX166

## 2012-09-01 LAB — BASIC METABOLIC PANEL
CO2: 23 mEq/L (ref 19–32)
Chloride: 95 mEq/L — ABNORMAL LOW (ref 96–112)
Creatinine, Ser: 1.38 mg/dL — ABNORMAL HIGH (ref 0.50–1.35)
GFR calc Af Amer: 69 mL/min — ABNORMAL LOW (ref >90)
Potassium: 4.1 mEq/L (ref 3.5–5.1)
Sodium: 130 mEq/L — ABNORMAL LOW (ref 135–145)

## 2012-09-01 LAB — CBC
MCV: 87.4 fL (ref 78.0–100.0)
Platelets: 452 10*3/uL — ABNORMAL HIGH (ref 150–400)
RBC: 3.26 MIL/uL — ABNORMAL LOW (ref 4.22–5.81)
RDW: 12.9 % (ref 11.5–15.5)
WBC: 16.7 10*3/uL — ABNORMAL HIGH (ref 4.0–10.5)

## 2012-09-01 LAB — GLUCOSE, CAPILLARY
Glucose-Capillary: 120 mg/dL — ABNORMAL HIGH (ref 70–99)
Glucose-Capillary: 121 mg/dL — ABNORMAL HIGH (ref 70–99)
Glucose-Capillary: 85 mg/dL (ref 70–99)

## 2012-09-01 SURGERY — AMPUTATION, FOOT, RAY
Anesthesia: General | Laterality: Right | Wound class: Dirty or Infected

## 2012-09-01 MED ORDER — ONDANSETRON HCL 4 MG/2ML IJ SOLN: 4.0000 mg | Freq: Four times a day (QID) | INTRAMUSCULAR | Status: DC | PRN

## 2012-09-01 MED ORDER — HYDROMORPHONE HCL PF 1 MG/ML IJ SOLN: 0.2500 mg | INTRAMUSCULAR | Status: DC | PRN

## 2012-09-01 MED ORDER — PROMETHAZINE HCL 25 MG/ML IJ SOLN
6.2500 mg | INTRAMUSCULAR | Status: DC | PRN
  Filled 2012-09-01: qty 1

## 2012-09-01 MED ORDER — ONDANSETRON HCL 4 MG/2ML IJ SOLN
INTRAMUSCULAR | Status: DC | PRN
  Administered 2012-09-01: 4 mg via INTRAVENOUS

## 2012-09-01 MED ORDER — LACTATED RINGERS IV SOLN
INTRAVENOUS | Status: DC | PRN
  Administered 2012-09-01: 09:00:00 via INTRAVENOUS

## 2012-09-01 MED ORDER — METOCLOPRAMIDE HCL 10 MG PO TABS: 5.0000 mg | ORAL_TABLET | Freq: Three times a day (TID) | ORAL | Status: DC | PRN

## 2012-09-01 MED ORDER — CEFAZOLIN SODIUM 1-5 GM-% IV SOLN
1.0000 g | Freq: Four times a day (QID) | INTRAVENOUS | Status: AC
  Administered 2012-09-01 – 2012-09-02 (×3): 1 g via INTRAVENOUS
  Filled 2012-09-01 (×3): qty 50

## 2012-09-01 MED ORDER — PHENYLEPHRINE HCL 10 MG/ML IJ SOLN
INTRAMUSCULAR | Status: DC | PRN
  Administered 2012-09-01 (×3): 80 ug via INTRAVENOUS

## 2012-09-01 MED ORDER — SODIUM CHLORIDE 0.9 % IV SOLN
INTRAVENOUS | Status: DC
  Administered 2012-09-01: 12:00:00 via INTRAVENOUS

## 2012-09-01 MED ORDER — CEPHALEXIN 500 MG PO CAPS
500.0000 mg | ORAL_CAPSULE | Freq: Four times a day (QID) | ORAL | Status: DC
  Administered 2012-09-02 (×2): 500 mg via ORAL
  Filled 2012-09-01 (×5): qty 1

## 2012-09-01 MED ORDER — OXYCODONE HCL 5 MG/5ML PO SOLN: 5.0000 mg | Freq: Once | ORAL | Status: DC | PRN

## 2012-09-01 MED ORDER — CEFAZOLIN SODIUM-DEXTROSE 2-3 GM-% IV SOLR
INTRAVENOUS | Status: AC
  Filled 2012-09-01: qty 50

## 2012-09-01 MED ORDER — MORPHINE SULFATE 2 MG/ML IJ SOLN
2.0000 mg | INTRAMUSCULAR | Status: DC | PRN
  Administered 2012-09-01: 2 mg via INTRAVENOUS
  Filled 2012-09-01: qty 1

## 2012-09-01 MED ORDER — PROPOFOL 10 MG/ML IV BOLUS
INTRAVENOUS | Status: DC | PRN
  Administered 2012-09-01: 200 mg via INTRAVENOUS

## 2012-09-01 MED ORDER — METOCLOPRAMIDE HCL 5 MG/ML IJ SOLN
5.0000 mg | Freq: Three times a day (TID) | INTRAMUSCULAR | Status: DC | PRN
  Filled 2012-09-01: qty 2

## 2012-09-01 MED ORDER — MEPERIDINE HCL 25 MG/ML IJ SOLN: 6.2500 mg | INTRAMUSCULAR | Status: DC | PRN

## 2012-09-01 MED ORDER — SENNOSIDES-DOCUSATE SODIUM 8.6-50 MG PO TABS
1.0000 | ORAL_TABLET | Freq: Two times a day (BID) | ORAL | Status: DC
  Administered 2012-09-01 – 2012-09-02 (×3): 1 via ORAL
  Filled 2012-09-01 (×4): qty 1

## 2012-09-01 MED ORDER — WARFARIN SODIUM 10 MG PO TABS
10.0000 mg | ORAL_TABLET | Freq: Once | ORAL | Status: AC
  Administered 2012-09-01: 10 mg via ORAL
  Filled 2012-09-01: qty 1

## 2012-09-01 MED ORDER — 0.9 % SODIUM CHLORIDE (POUR BTL) OPTIME
TOPICAL | Status: DC | PRN
  Administered 2012-09-01: 1000 mL

## 2012-09-01 MED ORDER — FENTANYL CITRATE 0.05 MG/ML IJ SOLN
INTRAMUSCULAR | Status: DC | PRN
  Administered 2012-09-01 (×2): 50 ug via INTRAVENOUS

## 2012-09-01 MED ORDER — PATIENT'S GUIDE TO USING COUMADIN BOOK
Freq: Once | Status: AC
  Administered 2012-09-01: 18:00:00
  Filled 2012-09-01: qty 1

## 2012-09-01 MED ORDER — ACETAMINOPHEN 10 MG/ML IV SOLN
1000.0000 mg | Freq: Once | INTRAVENOUS | Status: DC
  Filled 2012-09-01: qty 100

## 2012-09-01 MED ORDER — CEFAZOLIN SODIUM-DEXTROSE 2-3 GM-% IV SOLR
INTRAVENOUS | Status: DC | PRN
  Administered 2012-09-01: 2 g via INTRAVENOUS

## 2012-09-01 MED ORDER — ALUM & MAG HYDROXIDE-SIMETH 200-200-20 MG/5ML PO SUSP
15.0000 mL | Freq: Four times a day (QID) | ORAL | Status: DC | PRN
  Administered 2012-09-01 – 2012-09-02 (×2): 15 mL via ORAL
  Filled 2012-09-01 (×2): qty 30

## 2012-09-01 MED ORDER — LIDOCAINE HCL (CARDIAC) 20 MG/ML IV SOLN
INTRAVENOUS | Status: DC | PRN
  Administered 2012-09-01: 10 mg via INTRAVENOUS

## 2012-09-01 MED ORDER — ONDANSETRON HCL 4 MG PO TABS: 4.0000 mg | ORAL_TABLET | Freq: Four times a day (QID) | ORAL | Status: DC | PRN

## 2012-09-01 MED ORDER — CLINDAMYCIN PHOSPHATE 600 MG/50ML IV SOLN
600.0000 mg | Freq: Three times a day (TID) | INTRAVENOUS | Status: DC
  Administered 2012-09-01: 600 mg via INTRAVENOUS
  Filled 2012-09-01 (×3): qty 50

## 2012-09-01 MED ORDER — WARFARIN VIDEO
Freq: Once | Status: AC
  Administered 2012-09-01: 18:00:00

## 2012-09-01 MED ORDER — MIDAZOLAM HCL 5 MG/5ML IJ SOLN
INTRAMUSCULAR | Status: DC | PRN
  Administered 2012-09-01: 2 mg via INTRAVENOUS

## 2012-09-01 MED ORDER — WARFARIN - PHARMACIST DOSING INPATIENT: Freq: Every day | Status: DC

## 2012-09-01 MED ORDER — MIDAZOLAM HCL 2 MG/2ML IJ SOLN: 0.5000 mg | Freq: Once | INTRAMUSCULAR | Status: DC | PRN

## 2012-09-01 MED ORDER — SODIUM CHLORIDE 0.9 % IV SOLN
INTRAVENOUS | Status: DC
  Filled 2012-09-01: qty 1000

## 2012-09-01 MED ORDER — CEFAZOLIN SODIUM-DEXTROSE 2-3 GM-% IV SOLR: 2.0000 g | Freq: Once | INTRAVENOUS | Status: DC

## 2012-09-01 MED ORDER — OXYCODONE HCL 5 MG PO TABS: 5.0000 mg | ORAL_TABLET | Freq: Once | ORAL | Status: DC | PRN

## 2012-09-01 SURGICAL SUPPLY — 48 items
BANDAGE ESMARK 6X9 LF (GAUZE/BANDAGES/DRESSINGS) IMPLANT
BANDAGE GAUZE ELAST BULKY 4 IN (GAUZE/BANDAGES/DRESSINGS) ×2 IMPLANT
BLADE SAW SGTL MED 73X18.5 STR (BLADE) ×1 IMPLANT
BNDG CMPR 9X4 STRL LF SNTH (GAUZE/BANDAGES/DRESSINGS)
BNDG CMPR 9X6 STRL LF SNTH (GAUZE/BANDAGES/DRESSINGS)
BNDG COHESIVE 4X5 TAN STRL (GAUZE/BANDAGES/DRESSINGS) ×4 IMPLANT
BNDG COHESIVE 6X5 TAN STRL LF (GAUZE/BANDAGES/DRESSINGS) ×3 IMPLANT
BNDG ESMARK 4X9 LF (GAUZE/BANDAGES/DRESSINGS) IMPLANT
BNDG ESMARK 6X9 LF (GAUZE/BANDAGES/DRESSINGS)
CLOTH BEACON ORANGE TIMEOUT ST (SAFETY) ×3 IMPLANT
COVER SURGICAL LIGHT HANDLE (MISCELLANEOUS) ×3 IMPLANT
CUFF TOURNIQUET SINGLE 34IN LL (TOURNIQUET CUFF) IMPLANT
CUFF TOURNIQUET SINGLE 44IN (TOURNIQUET CUFF) IMPLANT
DRAPE EXTREMITY BILATERAL (DRAPE) ×1 IMPLANT
DRAPE U-SHAPE 47X51 STRL (DRAPES) ×6 IMPLANT
DRSG ADAPTIC 3X8 NADH LF (GAUZE/BANDAGES/DRESSINGS) ×3 IMPLANT
DRSG EMULSION OIL 3X3 NADH (GAUZE/BANDAGES/DRESSINGS) ×1 IMPLANT
DRSG PAD ABDOMINAL 8X10 ST (GAUZE/BANDAGES/DRESSINGS) ×2 IMPLANT
DURAPREP 26ML APPLICATOR (WOUND CARE) ×4 IMPLANT
ELECT REM PT RETURN 9FT ADLT (ELECTROSURGICAL) ×3
ELECTRODE REM PT RTRN 9FT ADLT (ELECTROSURGICAL) ×2 IMPLANT
GLOVE BIOGEL PI IND STRL 9 (GLOVE) ×2 IMPLANT
GLOVE BIOGEL PI INDICATOR 9 (GLOVE) ×1
GLOVE SURG ORTHO 9.0 STRL STRW (GLOVE) ×3 IMPLANT
GOWN PREVENTION PLUS XLARGE (GOWN DISPOSABLE) ×3 IMPLANT
GOWN SRG XL XLNG 56XLVL 4 (GOWN DISPOSABLE) ×2 IMPLANT
GOWN STRL NON-REIN XL XLG LVL4 (GOWN DISPOSABLE) ×3
KIT BASIN OR (CUSTOM PROCEDURE TRAY) ×3 IMPLANT
KIT ROOM TURNOVER OR (KITS) ×3 IMPLANT
MANIFOLD NEPTUNE II (INSTRUMENTS) ×2 IMPLANT
NEEDLE 22X1 1/2 (OR ONLY) (NEEDLE) IMPLANT
NS IRRIG 1000ML POUR BTL (IV SOLUTION) ×3 IMPLANT
PACK ORTHO EXTREMITY (CUSTOM PROCEDURE TRAY) ×3 IMPLANT
PAD ARMBOARD 7.5X6 YLW CONV (MISCELLANEOUS) ×6 IMPLANT
PAD CAST 4YDX4 CTTN HI CHSV (CAST SUPPLIES) ×2 IMPLANT
PADDING CAST COTTON 4X4 STRL (CAST SUPPLIES)
SPONGE GAUZE 4X4 12PLY (GAUZE/BANDAGES/DRESSINGS) ×4 IMPLANT
SPONGE LAP 18X18 X RAY DECT (DISPOSABLE) ×3 IMPLANT
STAPLER VISISTAT 35W (STAPLE) ×3 IMPLANT
STOCKINETTE IMPERVIOUS LG (DRAPES) IMPLANT
SUCTION FRAZIER TIP 10 FR DISP (SUCTIONS) ×3 IMPLANT
SUT ETHILON 2 0 PSLX (SUTURE) ×8 IMPLANT
SYR CONTROL 10ML LL (SYRINGE) IMPLANT
TOWEL OR 17X24 6PK STRL BLUE (TOWEL DISPOSABLE) ×3 IMPLANT
TOWEL OR 17X26 10 PK STRL BLUE (TOWEL DISPOSABLE) ×3 IMPLANT
TUBE CONNECTING 12X1/4 (SUCTIONS) ×3 IMPLANT
UNDERPAD 30X30 INCONTINENT (UNDERPADS AND DIAPERS) ×2 IMPLANT
WATER STERILE IRR 1000ML POUR (IV SOLUTION) ×2 IMPLANT

## 2012-09-01 NOTE — Progress Notes (Signed)
ANTICOAGULATION CONSULT NOTE - Initial Consult  Pharmacy Consult for coumadin Indication: VTE prophylaxis  No Known Allergies  Patient Measurements: Height: 5\' 8"  (172.7 cm) Weight: 185 lb 12.8 oz (84.278 kg) IBW/kg (Calculated) : 68.4   Vital Signs: Temp: 99 F (37.2 C) (05/25 1130) Temp src: Oral (05/25 1130) BP: 130/79 mmHg (05/25 1130) Pulse Rate: 93 (05/25 1130)  Labs:  Recent Labs  08/30/12 0550 09/01/12 0810  HGB 9.2* 9.4*  HCT 28.2* 28.5*  PLT 409* 452*  CREATININE 1.32 1.38*    Estimated Creatinine Clearance: 70 ml/min (by C-G formula based on Cr of 1.38).   Medical History: Past Medical History  Diagnosis Date  . Onychomycosis   . Hypertension   . Diabetic peripheral neuropathy   . Diabetic foot ulcer 12/09    left plantar area  . History of drug abuse     Cocaine and marijuana  . Exposure to trichomonas     treated empirically  . Cellulitis of left foot 03/2008    left 4th and 5th metatarsal area  . Hyperlipidemia   . 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 Ulcerative esophagitis 2007    severe, complicated with UGI bleed  . Mallory Janann August tear April 2009  . History of prolonged Q-T interval on ECG   . 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  . 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 CKD (chronic kidney disease) stage 2, GFR 60-89 ml/min     BL SCr 1.3-1.4  . Depression   . Peripheral vascular disease   . Type 2 diabetes mellitus, uncontrolled, with renal complications   . GERD (gastroesophageal reflux disease)     Medications:  Prescriptions prior to admission  Medication Sig Dispense Refill  . ACCU-CHEK FASTCLIX LANCETS MISC 1 each by Does not apply route daily before breakfast.  102 each  2  . acetaminophen (TYLENOL) 500 MG tablet Take 1,000 mg by mouth daily as needed for pain. In the morning       . Alum & Mag Hydroxide-Simeth (ANTACID LIQUID PO) Take 30 mLs by mouth daily as needed.      Marland Kitchen atorvastatin (LIPITOR) 20 MG tablet Take 1 tablet (20 mg total) by mouth daily.  30 tablet  0  . calcium carbonate (TUMS - DOSED IN MG ELEMENTAL CALCIUM) 500 MG chewable tablet Chew 2 tablets by mouth daily as needed for heartburn.      . diphenhydramine-acetaminophen (TYLENOL PM) 25-500 MG TABS Take 1 tablet by mouth at bedtime as needed (sleep/pain).      Marland Kitchen doxycycline (VIBRAMYCIN) 100 MG capsule Take 100 mg by mouth 2 (two) times daily.      Marland Kitchen FLUoxetine (PROZAC) 10 MG capsule Take 1 capsule (10 mg total) by mouth daily.  90 capsule  0  . gabapentin (NEURONTIN) 300 MG capsule Take 1 capsule (300 mg total) by mouth 3 (three) times daily.  90 capsule  2  . glucose blood (ACCU-CHEK SMARTVIEW) test strip Use as instructed  100 each  12  . insulin glargine (LANTUS) 100 UNIT/ML injection Inject 25 units before bedtime  10 mL  3  . lisinopril (PRINIVIL,ZESTRIL) 20 MG tablet Take 2 tablets (40 mg total) by mouth daily.  90 tablet  0  . pantoprazole (PROTONIX) 40 MG tablet Take 1 tablet (40 mg total) by mouth daily.  90 tablet  11  .  promethazine (PHENERGAN) 25 MG tablet Take 1 tablet (25 mg total) by mouth every 6 (six) hours as needed for nausea.  20 tablet  0    Assessment: [name] is a 47 yo BM to start coumadin for VTE px s/p R foot first ray ampuation and L foot revision of transmetatarsal amputation. His coumadin predictor score = 9.  Daily INRs have been ordered by MD to start in the am. His INR was 1.07 12/04/2011.  His H/H is 9.4/28.5 and PLTC 452.  Goal of Therapy:  INR 2-3 Monitor platelets by anticoagulation protocol: Yes   Plan:  1. Coumadin 10 mg po x 1 dose today 2. Daily INR 3. He continues on heparin 5000 units sq q8h until INR tx 4. Coumadin book and video ordered for education. Herby Abraham, Pharm.D. [phone] 09/01/2012 11:42 AM

## 2012-09-01 NOTE — Progress Notes (Signed)
Orthopedic Tech Progress Note Patient Details:  [name] 03/31/1966 [id number]  Ortho Devices Type of Ortho Device: Postop shoe/boot Ortho Device/Splint Location: bilateral Ortho Device/Splint Interventions: Application   [name] 09/01/2012, 1:24 PM

## 2012-09-01 NOTE — Anesthesia Postprocedure Evaluation (Signed)
  Anesthesia Post-op Note  Patient: [name]  Procedure(s) Performed: Procedure(s): FOOT 1ST RAY AMPUTATION (Right) FOOT REVISION TRANSMETATARSAL AMPUTATION  (Left)  Patient Location: PACU  Anesthesia Type:General  Level of Consciousness: awake, alert , oriented and patient cooperative  Airway and Oxygen Therapy: Patient Spontanous Breathing and Patient connected to nasal cannula oxygen  Post-op Pain: none  Post-op Assessment: Post-op Vital signs reviewed, Patient's Cardiovascular Status Stable, Respiratory Function Stable, Patent Airway, No signs of Nausea or vomiting and Pain level controlled  Post-op Vital Signs: Reviewed and stable  Complications: No apparent anesthesia complications

## 2012-09-01 NOTE — Transfer of Care (Signed)
Immediate Anesthesia Transfer of Care Note  Patient: [name]  Procedure(s) Performed: Procedure(s): FOOT 1ST RAY AMPUTATION (Right) FOOT REVISION TRANSMETATARSAL AMPUTATION  (Left)  Patient Location: PACU  Anesthesia Type:General  Level of Consciousness: awake, alert  and oriented  Airway & Oxygen Therapy: Patient Spontanous Breathing and Patient connected to nasal cannula oxygen  Post-op Assessment: Report given to PACU RN, Post -op Vital signs reviewed and stable and Patient moving all extremities X 4  Post vital signs: Reviewed and stable  Complications: No apparent anesthesia complications

## 2012-09-01 NOTE — Op Note (Signed)
OPERATIVE REPORT  DATE OF SURGERY: 09/01/2012  PATIENT:  [name],  47 y.o. male  PRE-OPERATIVE DIAGNOSIS:  chronic osteomyelitis and abscess right great toe; ulceration and necrotic tissue left transmetatarsal amputation  POST-OPERATIVE DIAGNOSIS:  same  PROCEDURE:  Procedure(s): FOOT 1ST RAY AMPUTATION right foot FOOT REVISION TRANSMETATARSAL AMPUTATION left foot Local tissue rearrangement for wound right foot 6 x 12 cm.  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 He also has ulceration and necrotic tissue of the left transmetatarsal amputation. Do to infection osteomyelitis abscess patient presents at this time for surgical intervention. Risks and benefits were discussed including infection neurovascular injury pain nonhealing of the wounds need for additional surgery. Patient states he understands and wishes to proceed at this time. Description of procedure patient brought to the operating room and underwent a general anesthetic. After adequate levels of anesthesia were obtained patient's bilateral lower extremities were prepped using DuraPrep and draped into a sterile field. Attention was first focused on the left transmetatarsal amputation. A fishmouth incision was made around the ulcer soft tissue and necrotic tissue this was carried down to bone a transmetatarsal amputation was was performed with an oscillating saw. Electrocautery was used for hemostasis the wound was irrigated with normal saline and the wound was closed with 2-0 nylon. There is no purulence no deep abscess. This was then focused on the right foot. A racquet incision was made around the ulcer and the  first ray was resected in one block of tissue. There was a deep abscess with necrotic tissue the tissue was debrided and removed the abscess was irrigated with normal saline tissue was debrided back to healthy viable tissue. The incision was closed using 2-0 nylon with local tissue rearrangement for wound closure. Total wound size was 6 x 12 cm. Both wounds were then covered with Adaptic orthopedic sponges AB dressing Kerlix and Coban. Patient was extubated taken to the PACU in stable condition.  PLAN OF CARE: Admit to inpatient   PATIENT DISPOSITION:  PACU - hemodynamically stable.   Nadara Mustard, MD 09/01/2012 10:39 AM

## 2012-09-01 NOTE — Preoperative (Signed)
Beta Blockers   Reason not to administer Beta Blockers:Not Applicable 

## 2012-09-01 NOTE — Progress Notes (Addendum)
Subjective:    Interval Events:  Patient had an episode of indigestion last night. No chest pain or dyspnea. Patient also had an episode of fever last night, MAXIMUM TEMPERATURE 102.3.     Objective:    Vital Signs:   Temp:  [99.3 F-102.3 F] 99.3 F (37.4 C) (05/25 0546) Pulse Rate:  [99-114] 99 (05/25 0546) Resp:  [17-18] 17 (05/25 0546) BP: (133-158)/(82-98) 133/85 mmHg (05/25 0546) SpO2:  [95 %-99 %] 98 % (05/25 0546)   Weights: 24-hour Weight change: -17 lb 0.4 oz (-7.722 kg)  Filed Weights   08/30/12 0510 08/31/12 0608 09/01/12 0546  Weight: 201 lb 15.1 oz (91.6 kg) 202 lb 13.2 oz (92 kg) 185 lb 12.8 oz (84.278 kg)    Intake/Output:   Gross per 24 hour  Intake      0 ml  Output   2770 ml  Net  -2770 ml   Last BM Date: 08/28/12   Physical Exam: GENERAL: alert and oriented; resting comfortably in bed and in no distress EYES: pupils equal, round, and reactive to light; sclera anicteric ENT: moist mucosa LUNGS: clear to auscultation bilaterally, normal work of breathing HEART: normal rate; regular rhythm; normal S1 and S2, no S3 or S4 appreciated; no murmurs, rubs, or clicks ABDOMEN: soft, non-tender, normal bowel sounds, no masses palpated    Labs: Basic Metabolic Panel: Lab 08/28/12 1405 08/29/12 0540 08/30/12 0550  NA 133* 129* 131*  K 3.8 4.7 4.0  CL 92* 93* 96  CO2 29 25 27   GLUCOSE 263* 340* 257*  BUN 13 14 16   CREATININE 1.33 1.31 1.32  CALCIUM 9.1 8.1* 8.4    CBC:  Lab 08/28/12 1405 08/28/12 1537 08/29/12 0540 08/30/12 0550  WBC 21.6*  --  17.4* 15.2*  NEUTROABS 18.9*  --   --   --   HGB 10.9* 13.9 9.5* 9.2*  HCT 32.1* 41.0 29.2* 28.2*  MCV 88.2  --  88.5 89.0  PLT 412*  --  369 409*    CBG: Lab 08/31/12 0751 08/31/12 1225 08/31/12 1652 08/31/12 2159 09/01/12 0624  GLUCAP 196* 164* 280* 153* 120*    Microbiology: CULTURE, BLOOD (ROUTINE X 2)     Status: None   Collection Time    08/28/12  8:45 PM      Result Value  Range Status   Specimen Description BLOOD ARM RIGHT   Final   Value:        BLOOD CULTURE RECEIVED NO GROWTH TO DATE CULTURE WILL BE HELD FOR 5 DAYS BEFORE ISSUING A FINAL NEGATIVE REPORT   Report Status PENDING   Incomplete  CULTURE, BLOOD (ROUTINE X 2)     Status: None   Collection Time    08/28/12  8:55 PM      Result Value Range Status   Specimen Description BLOOD ARM LEFT   Final   Value:        BLOOD CULTURE RECEIVED NO GROWTH TO DATE CULTURE WILL BE HELD FOR 5 DAYS BEFORE ISSUING A FINAL NEGATIVE REPORT   Report Status PENDING   Incomplete  ANAEROBIC CULTURE     Status: None   Collection Time    08/28/12  9:50 PM      Result Value Range Status   Specimen Description ABSCESS HAND LEFT   Final   Value: NO ANAEROBES ISOLATED; CULTURE IN PROGRESS FOR 5 DAYS   Report Status PENDING   Incomplete  CULTURE, ROUTINE-ABSCESS     Status: None  Collection Time    08/28/12  9:50 PM      Result Value Range Status   Specimen Description ABSCESS HAND LEFT   Final   Value: FEW WBC PRESENT,BOTH PMN AND MONONUCLEAR     NO SQUAMOUS EPITHELIAL CELLS SEEN     FEW GRAM POSITIVE COCCI     IN PAIRS   Culture     Final   Value: MODERATE GROUP B STREP(S.AGALACTIAE)ISOLATED     Note: TESTING AGAINST S. AGALACTIAE NOT ROUTINELY PERFORMED DUE TO PREDICTABILITY OF AMP/PEN/VAN SUSCEPTIBILITY.   Report Status 08/31/2012 FINAL   Final    Other results: Imaging: Dg Foot Complete Right 08/30/2012   FINDINGS: Patient is status post amputation through the base of the first proximal phalanx.  There is increased osseous resorption and erosion at the amputation site.  The first metatarsal appears stable with stable degenerative changes of the first metatarsal phalangeal joint.  The additional toes appear normal.  There is increased soft tissue swelling medial to the first metatarsal phalangeal joint.   IMPRESSION: Increased osseous resorption/erosion of the first proximal phalanx adjacent to the prior amputation  suspicious for recurrent osteomyelitis.    Medications:    Infusions:     Scheduled Medications: . cephALEXin  500 mg Oral Q6H  . clindamycin  600 mg Intravenous Once  . collagenase   Topical UD  . FLUoxetine  10 mg Oral Daily  . gabapentin  300 mg Oral TID  . heparin  5,000 Units Subcutaneous Q8H  . insulin aspart  0-20 Units Subcutaneous TID WC  . insulin aspart  0-5 Units Subcutaneous QHS  . insulin glargine  30 Units Subcutaneous QHS  . lisinopril  40 mg Oral Daily  . sodium chloride  3 mL Intravenous Q12H     PRN Medications: HYDROmorphone (DILAUDID) injection, ondansetron (ZOFRAN) IV, ondansetron, oxyCODONE-acetaminophen, promethazine, zolpidem    Assessment/ Plan:    1.   Cellulitis and abscess of hand:  Infected blister, sustained while doing yard work.  Culture positive for Streptococcus agalactiae.  Clindamycin narrowed to cephalexin.  OR on 5/21 and 5/23. - Cephalexin 500 mg QID - Oxycodone-acetaminophen 5-325 mg every 4 hours when necessary - Follow up with Dr. Melvyn Novas on Tuesday - Repeat CBC today  2.   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  Reports excellent compliance since he received medicaid.  Hemoblogin A1c still high at 13.7 though.  Urine albumin-to-creatinine ratio elevated at 100.2 on 06/21/2012. Patient on lisinopril 40 mg daily. Patient also takes gabapentin 300 mg 3 times a day for diabetic neuropathy. CBGs much improved on 30U Lantus. - Continue Lantus 30U each bedtime - Continue resistant scale correctional insulin aspart with meals and at bedtime - Continue gabapentin 300 mg 3 times a day  3.   Bilateral diabetic foot ulcers complicated by osteomyelitis:  Has been on doxycycline for several weeks, prescribed by wound clinic.  Wounds continue to be non-healing, likely because of poor circulation and diabetes.  Osteomyelitis on radiography.  Orthopedics to take to OR today for  further amputations. - Have wound care nurse see postoperatively  - Follow up with wound clinic, appt on 5/30  4.   Hypertension:  At home he takes lisinopril 40 mg daily. On this now.  Blood pressures reasonably well controlled.  Consider adding calcium channel blocker in the future to target proteinuria. - Continue lisinopril 40 mg daily  5.   Diarrhea:  Resolved.  C diff PCR not collected.  Order canceled.  Cancel enteric precautions.  6.   Acute bronchitis:  Reports 2 days of cough without dyspnea.  Some sputum production with cough.  No evidence of pneumonia on chest xray and lung auscultation is normal.  Will treat symptomatically since this is almost certainly viral in nature.  7.   Depression:  Stable. At home he takes fluoxetine 10 mg daily. This was held admission. We will restart this now. - Continue fluoxetine 10 mg daily  8.   Hyponatremia:  Sodium low on BMET from 5/23 at 131, but this corrects to 134; accounting for hyperglycemia.   - Repeat BMET today  9.   Prophylaxis: - Heparin 5,000 units [place] TID for VTE - Senna-docusate for bowel regimen  10. Disposition:  OPC patient.  Expect discharge today or tomorrow.  OPC follow up established for June 3rd.  Patient needs to follow up with Dr. Melvyn Novas on Tuesday, May 27th.    Length of Stay: 4 days   Signed by:  Dorthula Rue. Earlene Plater, MD PGY-I, Internal Medicine Pager [phone]  09/01/2012, 7:25 AM    Addendum:    CBC    Component Value Date/Time   WBC 16.7* 09/01/2012 0810   RBC 3.26* 09/01/2012 0810   HGB 9.4* 09/01/2012 0810   HCT 28.5* 09/01/2012 0810   PLT 452* 09/01/2012 0810   MCV 87.4 09/01/2012 0810   MCH 28.8 09/01/2012 0810   MCHC 33.0 09/01/2012 0810   RDW 12.9 09/01/2012 0810   LYMPHSABS 1.4 08/28/2012 1405   MONOABS 1.1* 08/28/2012 1405   EOSABS 0.2 08/28/2012 1405   BASOSABS 0.0 08/28/2012 1405   BMET    Component Value Date/Time   NA 130* 09/01/2012 0810   K 4.1 09/01/2012 0810   CL 95* 09/01/2012 0810    CO2 23 09/01/2012 0810   GLUCOSE 128* 09/01/2012 0810   BUN 12 09/01/2012 0810   CREATININE 1.38* 09/01/2012 0810   CREATININE 1.48* 07/03/2012 1516   CALCIUM 8.7 09/01/2012 0810   GFRNONAA 60* 09/01/2012 0810   GFRAA 69* 09/01/2012 0810    1) Febrile overnight with increasing leukocytosis.  Restarted clindamycin IV while NPO.  2) Also started NS at 115 mL/hr while NPO.    Signed by:  Dorthula Rue. Earlene Plater, MD PGY-I, Internal Medicine Pager [phone]  09/01/2012, 9:05 AM

## 2012-09-01 NOTE — Anesthesia Preprocedure Evaluation (Addendum)
Anesthesia Evaluation  Patient identified by MRN, date of birth, ID band Patient awake    Reviewed: Allergy & Precautions, H&P , NPO status , Patient's Chart, lab work & pertinent test results, reviewed documented beta blocker date and time   Airway Mallampati: I TM Distance: >3 FB Neck ROM: full    Dental  (+) Teeth Intact and Dental Advidsory Given   Pulmonary COPDCurrent Smoker,  breath sounds clear to auscultation  Pulmonary exam normal       Cardiovascular hypertension, On Medications + Peripheral Vascular Disease + dysrhythmias Rhythm:Regular Rate:Normal     Neuro/Psych negative neurological ROS     GI/Hepatic Neg liver ROS, PUD, GERD-  Medicated and Controlled,  Endo/Other  diabetes, Poorly Controlled, Type 1, Insulin Dependent  Renal/GU Renal InsufficiencyRenal disease (glu 121)     Musculoskeletal   Abdominal   Peds  Hematology  (+) Blood dyscrasia, anemia ,   Anesthesia Other Findings   Reproductive/Obstetrics                          Anesthesia Physical Anesthesia Plan  ASA: III  Anesthesia Plan: General   Post-op Pain Management:    Induction: Intravenous  Airway Management Planned: LMA  Additional Equipment:   Intra-op Plan:   Post-operative Plan:   Informed Consent: I have reviewed the patients History and Physical, chart, labs and discussed the procedure including the risks, benefits and alternatives for the proposed anesthesia with the patient or authorized representative who has indicated his/her understanding and acceptance.   Dental Advisory Given and Dental advisory given  Plan Discussed with: Anesthesiologist, CRNA and Surgeon  Anesthesia Plan Comments: (Plan routine monitors, GA -LMA OK)       Anesthesia Quick Evaluation

## 2012-09-01 NOTE — Anesthesia Procedure Notes (Signed)
Procedure Name: LMA Insertion Date/Time: 09/01/2012 9:53 AM Performed by: Carmela Rima Pre-anesthesia Checklist: Patient identified, Timeout performed, Emergency Drugs available, Suction available and Patient being monitored Patient Re-evaluated:Patient Re-evaluated prior to inductionOxygen Delivery Method: Circle system utilized Preoxygenation: Pre-oxygenation with 100% oxygen Intubation Type: IV induction Ventilation: Mask ventilation without difficulty LMA: LMA inserted LMA Size: 4.0 Number of attempts: 1 Tube secured with: Tape Dental Injury: Teeth and Oropharynx as per pre-operative assessment

## 2012-09-01 NOTE — H&P (Signed)
  Patient with chronic osteomyelitis and abscess right great toe. Patient states he's been treated at the wound center and he states they felt that this was getting better and would improve with hyperbaric oxygen. He also has ulceration and necrotic tissue over his left transmetatarsal amputation.  On examination there is purulent drainage with exposed bone first metatarsal right foot. Patient has diminished circulation.  Discussed that patient will require a first ray amputation of the right foot. With the deep abscess and infection may require additional amputation of the second ray. Discussed that we would evaluate this intraoperatively. With necrotic tissue over the transmetatarsal amputation the left foot we'll plan for revision of the left foot transmetatarsal amputation.  Risks and benefits of surgery were discussed patient states he understands and wished to proceed at this time.

## 2012-09-02 LAB — PROTIME-INR: INR: 1.31 (ref 0.00–1.49)

## 2012-09-02 LAB — ANAEROBIC CULTURE

## 2012-09-02 LAB — BASIC METABOLIC PANEL
BUN: 12 mg/dL (ref 6–23)
Calcium: 8.6 mg/dL (ref 8.4–10.5)
Creatinine, Ser: 1.42 mg/dL — ABNORMAL HIGH (ref 0.50–1.35)
GFR calc Af Amer: 67 mL/min — ABNORMAL LOW (ref >90)
GFR calc non Af Amer: 58 mL/min — ABNORMAL LOW (ref >90)

## 2012-09-02 LAB — CBC
HCT: 26.6 % — ABNORMAL LOW (ref 39.0–52.0)
MCHC: 32.3 g/dL (ref 30.0–36.0)
Platelets: 452 10*3/uL — ABNORMAL HIGH (ref 150–400)
RDW: 13.1 % (ref 11.5–15.5)

## 2012-09-02 LAB — GLUCOSE, CAPILLARY
Glucose-Capillary: 150 mg/dL — ABNORMAL HIGH (ref 70–99)
Glucose-Capillary: 159 mg/dL — ABNORMAL HIGH (ref 70–99)
Glucose-Capillary: 238 mg/dL — ABNORMAL HIGH (ref 70–99)

## 2012-09-02 MED ORDER — POLYETHYLENE GLYCOL 3350 17 G PO PACK
17.0000 g | PACK | Freq: Two times a day (BID) | ORAL | Status: DC
  Administered 2012-09-02 (×2): 17 g via ORAL
  Filled 2012-09-02 (×4): qty 1

## 2012-09-02 MED ORDER — WARFARIN SODIUM 10 MG PO TABS
10.0000 mg | ORAL_TABLET | Freq: Once | ORAL | Status: AC
  Administered 2012-09-02: 10 mg via ORAL
  Filled 2012-09-02: qty 1

## 2012-09-02 MED ORDER — CLINDAMYCIN HCL 300 MG PO CAPS
450.0000 mg | ORAL_CAPSULE | Freq: Three times a day (TID) | ORAL | Status: DC
  Administered 2012-09-02 – 2012-09-03 (×4): 450 mg via ORAL
  Filled 2012-09-02 (×6): qty 1

## 2012-09-02 MED ORDER — CLINDAMYCIN HCL 300 MG PO CAPS
450.0000 mg | ORAL_CAPSULE | Freq: Once | ORAL | Status: AC
  Administered 2012-09-02: 450 mg via ORAL
  Filled 2012-09-02: qty 1

## 2012-09-02 MED ORDER — PANTOPRAZOLE SODIUM 40 MG PO TBEC
40.0000 mg | DELAYED_RELEASE_TABLET | Freq: Every day | ORAL | Status: DC
  Administered 2012-09-02 – 2012-09-03 (×2): 40 mg via ORAL
  Filled 2012-09-02 (×2): qty 1

## 2012-09-02 NOTE — Progress Notes (Signed)
First visit and follow-up visit scheduled within the same day, as per charge nurse's recommendation. Pt appeared alert and responsive. I introduced myself and my role to the pt. Pt consented to receiving counseling and we signed an informed consent for counseling services. Pt discussed the various challenges from his marital relationship and his physical health. Pt specified the changes that he wants to see in his life upon discharge relating to self-care and his marital relationship. Pt discussed the various support factors in his life including his religious support, family support, and his neighbors. Pt indicated his appreciation for my time. I plan to check-in on the pt if he is not yet discharged tomorrow.  Sherol Dade Counselor Intern Haroldine Laws

## 2012-09-02 NOTE — Care Management Note (Signed)
   CARE MANAGEMENT NOTE 09/02/2012  Patient:  KAVAUGHN, FAUCETT   Account Number:  [id number]  Date Initiated:  08/29/2012  Documentation initiated by:  Letha Cape  Subjective/Objective Assessment:   dx sepsis  admit lives with spouse.     Action/Plan:   5/23- I and D of hand  5/25 OR for Lt transmetatarsal amp and rt great toe amputation.   Anticipated DC Date:  09/04/2012   Anticipated DC Plan:  HOME W HOME HEALTH SERVICES      DC Planning Services  CM consult      Choice offered to / List presented to:             Status of service:  In process, will continue to follow Medicare Important Message given?   (If response is "NO", the following Medicare IM given date fields will be blank) Date Medicare IM given:   Date Additional Medicare IM given:    Discharge Disposition:    Per UR Regulation:  Reviewed for med. necessity/level of care/duration of stay  If discussed at Long Length of Stay Meetings, dates discussed:    Comments:  09/02/2012 Pt s/p bilateral foot surgery on 09/01/12, with recent hand surgery. Pt is nonweight bearing, PT recommending wheelchair. Will follow for progression and d/c needs. Johny Shock RN MPH, case manager, [phone]  08/29/12 8:27 Letha Cape RN, BSN [phone] patient lives with spouse, NCM will continue to follow for dc needs.

## 2012-09-02 NOTE — Progress Notes (Signed)
Patient ID: [name], male   DOB: 03-Nov-1965, 47 y.o.   MRN: [id number] Postoperative day 1 bilateral foot salvage surgeries with revision transmetatarsal amputation on the left and first ray amputation on the right. Patient is comfortable without complaints. Patient will need to minimize his weightbearing on both lower extremities. I feel the patient is safe for discharge to home tomorrow morning. Patient will need oral antibiotics and feel that it would be appropriate for him to use an antibiotic that is free through the [employer] antibiotic program. I will followup in the office in 2 weeks.

## 2012-09-02 NOTE — Progress Notes (Signed)
Subjective:    Interval Events:  Throbbing pain in feet bilaterally.  Second night in a row he has had indigestion, relieved with Maalox.  No abdominal pain.  Flatus but no BM.    Objective:    Vital Signs:   Temp:  [99 F-101.9 F] 99.6 F (37.6 C) (05/26 0507) Pulse Rate:  [87-114] 107 (05/26 0507) Resp:  [11-23] 18 (05/26 0507) BP: (122-152)/(71-88) 124/88 mmHg (05/26 0937) SpO2:  [97 %-100 %] 97 % (05/26 0507)   Weights: Filed Weights   08/30/12 0510 08/31/12 0608 09/01/12 0546  Weight: 201 lb 15.1 oz (91.6 kg) 202 lb 13.2 oz (92 kg) 185 lb 12.8 oz (84.278 kg)    Intake/Output:   Gross per 24 hour  Intake   1043 ml  Output   1500 ml  Net   -457 ml     Last BM Date: 08/28/12   Physical Exam: GENERAL: alert and oriented; resting comfortably in bed and in no distress LUNGS: clear to auscultation bilaterally, normal work of breathing HEART: normal rate; regular rhythm; normal S1 and S2, no S3 or S4 appreciated; no murmurs, rubs, or clicks ABDOMEN: soft, non-tender, normal bowel sounds, no masses palpated    Labs: Basic Metabolic Panel: Lab 08/28/12 1405 08/29/12 0540 08/30/12 0550 09/01/12 0810 09/02/12 0550  NA 133* 129* 131* 130* 131*  K 3.8 4.7 4.0 4.1 4.1  CL 92* 93* 96 95* 94*  CO2 29 25 27 23 27   GLUCOSE 263* 340* 257* 128* 172*  BUN 13 14 16 12 12   CREATININE 1.33 1.31 1.32 1.38* 1.42*  CALCIUM 9.1 8.1* 8.4 8.7 8.6    CBC: Lab 08/28/12 1405 08/28/12 1537 08/29/12 0540 08/30/12 0550 09/01/12 0810 09/02/12 0550  WBC 21.6*  --  17.4* 15.2* 16.7* 15.8*  NEUTROABS 18.9*  --   --   --   --   --   HGB 10.9* 13.9 9.5* 9.2* 9.4* 8.6*  HCT 32.1* 41.0 29.2* 28.2* 28.5* 26.6*  MCV 88.2  --  88.5 89.0 87.4 88.4  PLT 412*  --  369 409* 452* 452*    Coagulation Studies:  09/02/12 0550  LABPROT 16.0*  INR 1.31    Microbiology: Results for orders placed during the hospital encounter of 08/28/12  CULTURE, BLOOD (ROUTINE X 2)     Status: None    Collection Time    08/28/12  8:45 PM      Result Value Range Status   Specimen Description BLOOD ARM RIGHT   Final   Special Requests BOTTLES DRAWN AEROBIC AND ANAEROBIC 10CC   Final   Culture  Setup Time 08/29/2012 05:08   Final   Culture     Final   Value:        BLOOD CULTURE RECEIVED NO GROWTH TO DATE CULTURE WILL BE HELD FOR 5 DAYS BEFORE ISSUING A FINAL NEGATIVE REPORT   Report Status PENDING   Incomplete  CULTURE, BLOOD (ROUTINE X 2)     Status: None   Collection Time    08/28/12  8:55 PM      Result Value Range Status   Specimen Description BLOOD ARM LEFT   Final   Special Requests BOTTLES DRAWN AEROBIC AND ANAEROBIC 10CC   Final   Culture  Setup Time 08/29/2012 05:08   Final   Culture     Final   Value:        BLOOD CULTURE RECEIVED NO GROWTH TO DATE CULTURE WILL BE HELD FOR 5  DAYS BEFORE ISSUING A FINAL NEGATIVE REPORT   Report Status PENDING   Incomplete  ANAEROBIC CULTURE     Status: None   Collection Time    08/28/12  9:50 PM      Result Value Range Status   Specimen Description ABSCESS HAND LEFT   Final   Special Requests PATIENT ON FOLLOWING CLEOCIN   Final   Gram Stain PENDING   Incomplete   Culture     Final   Value: NO ANAEROBES ISOLATED; CULTURE IN PROGRESS FOR 5 DAYS   Report Status PENDING   Incomplete  CULTURE, ROUTINE-ABSCESS     Status: None   Collection Time    08/28/12  9:50 PM      Result Value Range Status   Specimen Description ABSCESS HAND LEFT   Final   Special Requests PATIENT ON FOLLOWING CLEOCIN   Final   Gram Stain     Final   Value: FEW WBC PRESENT,BOTH PMN AND MONONUCLEAR     NO SQUAMOUS EPITHELIAL CELLS SEEN     FEW GRAM POSITIVE COCCI     IN PAIRS   Culture     Final   Value: MODERATE GROUP B STREP(S.AGALACTIAE)ISOLATED     Note: TESTING AGAINST S. AGALACTIAE NOT ROUTINELY PERFORMED DUE TO PREDICTABILITY OF AMP/PEN/VAN SUSCEPTIBILITY.   Report Status 08/31/2012 FINAL   Final  SURGICAL PCR SCREEN     Status: None   Collection Time      08/30/12 10:35 AM      Result Value Range Status   MRSA, PCR NEGATIVE  NEGATIVE Final   Staphylococcus aureus NEGATIVE  NEGATIVE Final   Comment:            The Xpert SA Assay (FDA     approved for NASAL specimens     in patients over 47 years of age),     is one component of     a comprehensive surveillance     program.  Test performance has     been validated by [employer] for patients greater     than or equal to 43 year old.     It is not intended     to diagnose infection nor to     guide or monitor treatment.    Imaging: No results found.    Medications:    Infusions: . sodium chloride 10 mL/hr at 09/01/12 1229     Scheduled Medications: . acetaminophen  1,000 mg Intravenous Once  . clindamycin  450 mg Oral Q8H  . clindamycin  450 mg Oral Once  . FLUoxetine  10 mg Oral Daily  . gabapentin  300 mg Oral TID  . heparin  5,000 Units Subcutaneous Q8H  . insulin aspart  0-20 Units Subcutaneous TID WC  . insulin aspart  0-5 Units Subcutaneous QHS  . insulin glargine  30 Units Subcutaneous QHS  . lisinopril  40 mg Oral Daily  . pantoprazole  40 mg Oral Daily  . senna-docusate  1 tablet Oral BID  . sodium chloride  3 mL Intravenous Q12H  . warfarin  10 mg Oral ONCE-1800  . Warfarin - Pharmacist Dosing Inpatient   Does not apply q1800     PRN Medications: alum & mag hydroxide-simeth, metoCLOPramide (REGLAN) injection, metoCLOPramide, morphine injection, ondansetron (ZOFRAN) IV, ondansetron, oxyCODONE-acetaminophen, zolpidem    Assessment/ Plan:    1.   Cellulitis and abscess of hand:  Infected blister, sustained while doing yard work.  Culture positive for Streptococcus agalactiae.  Clindamycin narrowed to cephalexin, now back to clindamycin with continued fevers.  OR on 5/21 and 5/23. - Clindamycin 450 mg TID - Oxycodone-acetaminophen 5-325 mg every 4 hours when necessary - Morphine 2mg  IV q4 for breakthrough - Follow up with Dr. Melvyn Novas on  Tuesday  2.   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  Reports excellent compliance since he received medicaid.  Hemoblogin A1c still high at 13.7 though.  Urine albumin-to-creatinine ratio elevated at 100.2 on 06/21/2012. Patient on lisinopril 40 mg daily. Patient also takes gabapentin 300 mg 3 times a day for diabetic neuropathy. CBGs much improved on 30U Lantus. - Continue Lantus 30U each bedtime - Continue resistant scale correctional insulin aspart with meals and at bedtime - Continue gabapentin 300 mg 3 times a day  3.   Bilateral diabetic foot ulcers complicated by osteomyelitis:  Has been on doxycycline for several weeks, prescribed by wound clinic.  Wounds continue to be non-healing, likely because of poor circulation and diabetes.  Osteomyelitis on radiography.  OR 5/25 by ortho for debridement of both feet and ray amputation in right foot. - Follow up with wound clinic, appt on 5/30 - Clindamycin as noted above - Warfarin per pharmacy, Dr. Audrie Lia request  4.   Hypertension:  At home he takes lisinopril 40 mg daily. On this now.  Blood pressures reasonably well controlled.  Consider adding calcium channel blocker in the future to target proteinuria. - Continue lisinopril 40 mg daily  5.   Diarrhea:  Resolved. C diff PCR not collected.  Order canceled.  Cancel enteric precautions.  6.   Acute bronchitis:  Reports 2 days of cough without dyspnea.  Some sputum production with cough.  No evidence of pneumonia on chest xray and lung auscultation is normal.  Will treat symptomatically since this is almost certainly viral in nature.  7.   Depression:  Stable. At home he takes fluoxetine 10 mg daily. This was held admission. We will restart this now. - Continue fluoxetine 10 mg daily  8.   Hyponatremia:  Sodium low on BMET from 5/23 at 131, but this correctedo 134; accounting for hyperglycemia.  Sodium remains low despite  better glycemic control: 130 on 5/25 and 131 on 5/26.  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  9.   Prophylaxis: - Heparin 5,000 units [place] TID for VTE - Senna-docusate & PEG for bowel regimen - Pantoprazole   10. Disposition:  OPC patient.  Expect discharge  tomorrow.  OPC follow up established for June 3rd.  Patient needs to follow up with Dr. Lajoyce Corners in two weeks.    Length of Stay: 5 days   Signed by:  Dorthula Rue. Earlene Plater, MD PGY-I, Internal Medicine Pager [phone]  09/02/2012, 10:24 AM

## 2012-09-02 NOTE — Progress Notes (Signed)
ANTICOAGULATION CONSULT NOTE  Pharmacy Consult for coumadin Indication: VTE prophylaxis  No Known Allergies  Patient Measurements: Height: 5\' 8"  (172.7 cm) Weight: 185 lb 12.8 oz (84.278 kg) IBW/kg (Calculated) : 68.4   Vital Signs: Temp: 99.6 F (37.6 C) (05/26 0507) Temp src: Oral (05/26 0507) BP: 136/86 mmHg (05/26 0507) Pulse Rate: 107 (05/26 0507)  Labs:  Recent Labs  09/01/12 0810 09/02/12 0550  HGB 9.4* 8.6*  HCT 28.5* 26.6*  PLT 452* 452*  LABPROT  --  16.0*  INR  --  1.31  CREATININE 1.38* 1.42*    Estimated Creatinine Clearance: 68 ml/min (by C-G formula based on Cr of 1.42).   Medical History: Past Medical History  Diagnosis Date  . Onychomycosis   . Hypertension   . Diabetic peripheral neuropathy   . Diabetic foot ulcer 12/09    left plantar area  . History of drug abuse     Cocaine and marijuana  . Exposure to trichomonas     treated empirically  . Cellulitis of left foot 03/2008    left 4th and 5th metatarsal area  . Hyperlipidemia   . 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 Ulcerative esophagitis 2007    severe, complicated with UGI bleed  . Mallory Janann August tear April 2009  . History of prolonged Q-T interval on ECG   . 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  . 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 CKD (chronic kidney disease) stage 2, GFR 60-89 ml/min     BL SCr 1.3-1.4  . Depression   . Peripheral vascular disease   . Type 2 diabetes mellitus, uncontrolled, with renal complications   . GERD (gastroesophageal reflux disease)    Assessment: [name] is a 47 yo BM to start coumadin for VTE px s/p R foot first ray ampuation and L foot revision of transmetatarsal amputation. His coumadin predictor score = 9.  Daily INRs have been ordered by MD. His INR was 1.07 12/04/2011.   INR this am was 1.3  unknown at this was true baseline was prior to initiation.  Goal of Therapy:  INR 2-3 Monitor platelets by anticoagulation protocol: Yes   Plan:  1. Coumadin 10 mg po x 1 dose today 2. Daily INR 3. He continues on heparin 5000 units sq q8h until INR tx 4. Coumadin book and video ordered for education.  Sheppard Coil PharmD., BCPS Clinical Pharmacist Pager [phone] 09/02/2012 9:35 AM

## 2012-09-02 NOTE — Evaluation (Addendum)
Physical Therapy Evaluation Patient Details Name: [name] MRN: [id number] DOB: 1966/03/24 Today's Date: 09/02/2012 Time: 4098-1191 PT Time Calculation (min): 11 min  PT Assessment / Plan / Recommendation Clinical Impression    Pt admitted with lt hand infection and bil foot wounds. Pt currently with functional limitations due to the deficits listed below (PT Problem List). Pt will benefit from skilled PT to increase their independence and safety with mobility to allow discharge home. Recommend wheelchair with elevating legrests since per Dr. Audrie Lia note pt is to minimize wt bearing on feet.     PT Assessment  Patient needs continued PT services    Follow Up Recommendations  No PT follow up    Does the patient have the potential to tolerate intense rehabilitation      Barriers to Discharge        Equipment Recommendations  Wheelchair (measurements PT); (18" width w/c with elevating legrests)    Recommendations for Other Services     Frequency Min 3X/week    Precautions / Restrictions Precautions Precautions: Fall Required Braces or Orthoses: Other Brace/Splint Other Brace/Splint: Post op shoes on bil feet Restrictions Weight Bearing Restrictions: Yes RLE Weight Bearing: Weight bearing as tolerated LLE Weight Bearing: Weight bearing as tolerated Other Position/Activity Restrictions: Per order WBAT- Note states minimize wt bearing   Pertinent Vitals/Pain See flow sheet.      Mobility  Bed Mobility Bed Mobility: Supine to Sit;Sitting - Scoot to Edge of Bed Supine to Sit: 6: Modified independent (Device/Increase time) Sitting - Scoot to Edge of Bed: 6: Modified independent (Device/Increase time) Transfers Transfers: Sit to Stand;Stand to Sit Sit to Stand: 5: Supervision;With upper extremity assist;With armrests;From bed Stand to Sit: 5: Supervision;With upper extremity assist;With armrests;To chair/3-in-1 Ambulation/Gait Ambulation/Gait Assistance: 5:  Supervision Ambulation Distance (Feet): 20 Feet Assistive device: Rolling walker Ambulation/Gait Assistance Details: Verbal cues to stand more upright. Gait Pattern: Step-to pattern;Decreased step length - right;Decreased step length - left;Trunk flexed Gait velocity: decr General Gait Details: Pt used walker to minimize weight bearing on feet.    Exercises     PT Diagnosis: Difficulty walking;Acute pain  PT Problem List: Decreased mobility;Pain PT Treatment Interventions: DME instruction;Gait training;Patient/family education;Functional mobility training;Therapeutic activities   PT Goals Acute Rehab PT Goals PT Goal Formulation: With patient Time For Goal Achievement: 09/04/12 Potential to Achieve Goals: Good Pt will go Sit to Stand: with modified independence PT Goal: Sit to Stand - Progress: Goal set today Pt will go Stand to Sit: with modified independence PT Goal: Stand to Sit - Progress: Goal set today Pt will Transfer Bed to Chair/Chair to Bed: with modified independence PT Transfer Goal: Bed to Chair/Chair to Bed - Progress: Goal set today Pt will Ambulate: 16 - 50 feet;with modified independence;with rolling walker PT Goal: Ambulate - Progress: Goal set today  Visit Information  Last PT Received On: 09/02/12 Assistance Needed: +1    Subjective Data  Subjective: Pt states his cousin will be coming to stay with him. Patient Stated Goal: Return home   Prior Functioning  Home Living Lives With: Family Available Help at Discharge: Family Type of Home: Apartment Home Access: Ramped entrance Home Layout: One level Home Adaptive Equipment: Straight cane;Walker - rolling;Crutches;Raised toilet seat with rails Additional Comments: cousin coming to stay with pt.  Pt has a spouse but there appears to be some issues/animosity between them. Prior Function Level of Independence: Independent Communication Communication: No difficulties    Cognition   Cognition Arousal/Alertness: Awake/alert Behavior  During Therapy: WFL for tasks assessed/performed Overall Cognitive Status: Within Functional Limits for tasks assessed    Extremity/Trunk Assessment Right Lower Extremity Assessment RLE ROM/Strength/Tone: [facility] for tasks assessed Left Lower Extremity Assessment LLE ROM/Strength/Tone: [facility] for tasks assessed   Balance Balance Balance Assessed: Yes Static Standing Balance Static Standing - Balance Support: Bilateral upper extremity supported Static Standing - Level of Assistance: 5: Stand by assistance  End of Session PT - End of Session Activity Tolerance: Patient tolerated treatment well Patient left: in chair;with family/visitor present Nurse Communication: Mobility status  GP     [name] 09/02/2012, 3:31 PM  [facility] PT [phone]

## 2012-09-03 ENCOUNTER — Other Ambulatory Visit: Payer: Self-pay | Admitting: Internal Medicine

## 2012-09-03 DIAGNOSIS — M869 Osteomyelitis, unspecified: Secondary | ICD-10-CM

## 2012-09-03 DIAGNOSIS — Z5181 Encounter for therapeutic drug level monitoring: Secondary | ICD-10-CM

## 2012-09-03 DIAGNOSIS — K219 Gastro-esophageal reflux disease without esophagitis: Secondary | ICD-10-CM

## 2012-09-03 LAB — GLUCOSE, CAPILLARY
Glucose-Capillary: 176 mg/dL — ABNORMAL HIGH (ref 70–99)
Glucose-Capillary: 190 mg/dL — ABNORMAL HIGH (ref 70–99)

## 2012-09-03 LAB — PROTIME-INR: Prothrombin Time: 20.9 seconds — ABNORMAL HIGH (ref 11.6–15.2)

## 2012-09-03 MED ORDER — CLINDAMYCIN HCL 300 MG PO CAPS: 300.0000 mg | ORAL_CAPSULE | Freq: Three times a day (TID) | ORAL | Status: DC

## 2012-09-03 MED ORDER — WARFARIN SODIUM 7.5 MG PO TABS
7.5000 mg | ORAL_TABLET | Freq: Once | ORAL | Status: DC
  Filled 2012-09-03: qty 1

## 2012-09-03 MED ORDER — WARFARIN SODIUM 5 MG PO TABS: 5.0000 mg | ORAL_TABLET | Freq: Every day | ORAL | Status: DC

## 2012-09-03 MED ORDER — OXYCODONE-ACETAMINOPHEN 5-325 MG PO TABS: 1.0000 | ORAL_TABLET | ORAL | Status: DC | PRN

## 2012-09-03 MED ORDER — INSULIN GLARGINE 100 UNIT/ML ~~LOC~~ SOLN: 32.0000 [IU] | Freq: Every day | SUBCUTANEOUS | Status: DC

## 2012-09-03 MED ORDER — WARFARIN SODIUM 7.5 MG PO TABS
7.5000 mg | ORAL_TABLET | Freq: Once | ORAL | Status: AC
  Administered 2012-09-03: 7.5 mg via ORAL
  Filled 2012-09-03: qty 1

## 2012-09-03 NOTE — Discharge Summary (Signed)
Patient Name:  [name] MRN: [id number]  PCP: Dow Adolph, MD DOB:  [birth year]       Date of Admission:  08/28/2012  Date of Discharge:  09/03/2012      Attending Physician: Inez Catalina, MD         DISCHARGE DIAGNOSES: 1. Cellulitis and abscess of left hand 2. 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 Hypertension 5. Diarrhea, resolved 6. Acute viral bronchitis 7. Depression 8. Hyponatremia    DISPOSITION AND FOLLOW-UP: [name] is to follow-up with the listed providers as detailed below, at which time, the following should be addressed:  1. Follow-up visits: 1. HAND - Dressings to be left in place until follow up with Dr. Melvyn Novas on Thursday, May 29. 2. FOOT - Dressings to be left in place until follow up with Dr. Lajoyce Corners 2 weeks post-op.  3. DIABETES - Lantus dose escalated to 32U qHS. 4. WARFARIN - Prophylactic for 1 month post-op. D/C'd on 5 mg QD. Pt given meds by care management. 5. ID - Hand grew GBS. Home on TID clindamycin with 1 month supply. Pt given meds by care management.  2. Labs and images needed: 1. INR  3. Pending labs and tests needing follow-up: 1. BLOOD CULTURES    Follow-up Information   Follow up with Janalyn Harder, MD On 09/10/2012. (INTERNAL MEDICINE - Your appointment is on Tuesday, June 3rd at 8:45.)    Contact information:   [address] [place] [address] [phone]       Follow up with Nadara Mustard, MD In 2 weeks.   Contact information:   [address] [place] [address] [phone]       Follow up with Sharma Covert, MD. Schedule an appointment as soon as possible for a visit on 09/05/2012.   Contact information:   [address] [place] [address] [phone]       Follow up with [facility] On 09/05/2012. (LAB VISIT - Come any time on  Thursday to have your warfarin level checked.)    Contact information:   [address] [place] [address] [phone]     Discharge Orders   Future Appointments Provider Department Dept Phone   09/10/2012 8:45 AM Linward Headland, MD [facility] [phone]   Future Orders Complete By Expires     Call MD for:  redness, tenderness, or signs of infection (pain, swelling, redness, odor or green/yellow discharge around incision site)  As directed     Call MD for:  severe uncontrolled pain  As directed     Call MD for:  temperature >100.4  As directed     Diet general  As directed     Discharge instructions  As directed     Comments:      Do not bear weight until follow up with Dr. Lajoyce Corners.    Leave dressing on - Keep it clean, dry, and intact until clinic visit  As directed         DISCHARGE MEDICATIONS:   Medication List    STOP taking these medications       doxycycline 100 MG capsule  Commonly known as:  VIBRAMYCIN      TAKE these medications       ACCU-CHEK FASTCLIX LANCETS Misc  1 each by Does not apply route daily before  breakfast.     acetaminophen 500 MG tablet  Commonly known as:  TYLENOL  Take 1,000 mg by mouth daily as needed for pain. In the morning     ANTACID LIQUID PO  Take 30 mLs by mouth daily as needed.     atorvastatin 20 MG tablet  Commonly known as:  LIPITOR  Take 1 tablet (20 mg total) by mouth daily.     calcium carbonate 500 MG chewable tablet  Commonly known as:  TUMS - dosed in mg elemental calcium  Chew 2 tablets by mouth daily as needed for heartburn.     clindamycin 300 MG capsule  Commonly known as:  CLEOCIN  Take 1 capsule (300 mg total) by mouth 3 (three) times daily.     diphenhydramine-acetaminophen 25-500 MG Tabs  Commonly known as:  TYLENOL PM  Take 1 tablet by mouth at bedtime as needed (sleep/pain).     FLUoxetine 10 MG capsule  Commonly known as:  PROZAC  Take 1 capsule (10 mg total) by mouth daily.       gabapentin 300 MG capsule  Commonly known as:  NEURONTIN  Take 1 capsule (300 mg total) by mouth 3 (three) times daily.     glucose blood test strip  Commonly known as:  ACCU-CHEK SMARTVIEW  Use as instructed     insulin glargine 100 UNIT/ML injection  Commonly known as:  LANTUS  Inject 0.32 mLs (32 Units total) into the skin at bedtime. Inject 25 units before bedtime     lisinopril 20 MG tablet  Commonly known as:  PRINIVIL,ZESTRIL  Take 2 tablets (40 mg total) by mouth daily.     oxyCODONE-acetaminophen 5-325 MG per tablet  Commonly known as:  PERCOCET/ROXICET  Take 1 tablet by mouth every 4 (four) hours as needed for pain.     pantoprazole 40 MG tablet  Commonly known as:  PROTONIX  Take 1 tablet (40 mg total) by mouth daily.     promethazine 25 MG tablet  Commonly known as:  PHENERGAN  Take 1 tablet (25 mg total) by mouth every 6 (six) hours as needed for nausea.     warfarin 5 MG tablet  Commonly known as:  COUMADIN  Take 1 tablet (5 mg total) by mouth daily.         CONSULTS: 1.   Dr. Melvyn Novas, hand surgery 2.   Dr. Lajoyce Corners, orthopedics   PROCEDURES PERFORMED:  Dg Chest 2 View 08/28/2012   FINDINGS: Normal heart size, mediastinal contours, and pulmonary vascularity. Minimal chronic peribronchial thickening. No acute infiltrate, pleural effusion, or pneumothorax. No acute osseous findings.   IMPRESSION: Minimal chronic bronchitic changes without acute abnormalities.  Dg Hand Complete Left 08/28/2012   FINDINGS: Mild diffuse soft tissue swelling.  No soft tissue gas. No radio-opaque foreign body.    No acute osseous abnormality. IMPRESSION: Soft tissue swelling only.  Dg Foot Complete Right 08/30/2012   FINDINGS: Patient is status post amputation through the base of the first proximal phalanx.  There is increased osseous resorption and erosion at the amputation site.  The first metatarsal appears stable with stable degenerative changes of the first metatarsal  phalangeal joint.  The additional toes appear normal.  There is increased soft tissue swelling medial to the first metatarsal phalangeal joint. IMPRESSION: Increased osseous resorption/erosion of the first proximal phalanx adjacent to the prior amputation suspicious for recurrent osteomyelitis.  Incision and drainage of the left thumb deep abscess 08/30/2012 Dr. 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 Lajoyce Corners  Left hand incision and drainage of deep palmar abscess 08/28/2012 Dr. Melvyn Novas   ADMISSION DATA: H&P: Patient is a 47 y.o. male with a PMHx of uncontrolled DM2 with multiple complications (A1c 13.7), CKD 2-3, BL toe ambutations, polysubstance abuse who presents to [facility] for evaluation of progressively worsening left hand pain and swelling x 2 days. Pt indicated that 2 days ago, he spent an extended period of time raking leaves in the yard without gloves. He developed small blister that continued to get progressively larger and more painful but has not extended to his forearm. His is still able to use this arm but has problems with grip due to limitation of flexion in his 1st and 2nd digits. The subsequent day, he continued to rake leaves, this time employing gloves, despite which, he continued to have pain. He has experienced chills, but denies fevers. He has increased fatigue. He also reported two episodes of non-bloody emesis on the day of admission which he attributes to his usual acid reflux. He appetite has been low since onset of pain.   Physical Exam: Vitals: Blood pressure 113/66, pulse 108, temperature 98.5 F (36.9 C), resp. rate 9, SpO2 99.00%.   General:  Vital signs reviewed and noted. Well-developed, well-nourished, in no acute distress; alert, appropriate and cooperative throughout examination.   Head:  Normocephalic, atraumatic.   Eyes:  PERRL, EOMI, No signs of anemia or jaundince.   Nose:  Mucous membranes  moist, not inflammed, nonerythematous.   Throat:  Oropharynx nonerythematous, no exudate appreciated.   Neck:  No deformities, masses, or tenderness noted. Supple, No carotid Bruits, no JVD.   Lungs:  Normal respiratory effort. Clear to auscultation BL without crackles or wheezes.   Heart:  RRR. S1 and S2 normal without gallop, murmur, or rubs.   Abdomen:  BS normoactive. Soft, Nondistended, non-tender. No masses or organomegaly.   Extremities:  Lower extremities: No pretibial edema. The pedal pulses are strong. All toes are amputated on the left including the right great toe with an open wound of the stamp but no active bleeding. There is a smell emanating from his feet.  Left Hand: swelling the thenar eminence and the proximal thumb. The proximal index finder is also slightly swollen. There is tenderness and erythema of the same area. There is a small area of opening in the interdigital space between the left thumb and index finger. There is no active drainage from this area. The pulses are present and the wrist. Joint is not involved, and there is normal range of motion.   Neurologic:  A&O X3, CN II - XII are grossly intact. Motor strength is 5/5 in the all 4 extremities, Sensations intact to light touch, Cerebellar signs negative.   Skin:  No visible rashes, scars.     Labs: CBC:    Component  Value  Date/Time    WBC  21.6*  08/28/2012 1405    HGB  13.9  08/28/2012 1537    HCT  41.0  08/28/2012 1537    PLT  412*  08/28/2012 1405    MCV  88.2  08/28/2012 1405    NEUTROABS  18.9*  08/28/2012 1405    LYMPHSABS  1.4  08/28/2012 1405    MONOABS  1.1*  08/28/2012 1405    EOSABS  0.2  08/28/2012 1405    BASOSABS  0.0  08/28/2012 1405    Metabolic Panel:    Component  Value  Date/Time    NA  139  08/28/2012 1537    K  3.6  08/28/2012 1537    CL  102  08/28/2012 1537    CO2  29  08/28/2012 1405    BUN  15  08/28/2012 1537    CREATININE  1.20  08/28/2012 1537    GLUCOSE  92  08/28/2012 1537    CALCIUM   9.1  08/28/2012 1405     HOSPITAL COURSE: 1.   Cellulitis and abscess of hand:  Infected blister, sustained while doing yard work. I&D by Dr. Melvyn Novas on 5/21 and 5/23. Culture positive for Streptococcus agalactiae. Clindamycin narrowed to cephalexin, but remained febrile so patient put back on clindamycin and discharged with clindamycin 300 mg 3 times a day for one month. Dr. Melvyn Novas was unable to see the patient on Tuesday in the hospital; so at Dr. Glenna Durand instructions, the patient will followup with him on Thursday in his clinic.  2.   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 Reports excellent compliance since he received medicaid. Hemoblogin A1c still high at 13.7 though. Urine albumin-to-creatinine ratio elevated at 100.2 on 06/21/2012. Patient on lisinopril 40 mg daily. Patient also takes gabapentin 300 mg 3 times a day for diabetic neuropathy. CBGs much improved on 30U Lantus and resistant scale correctional insulin aspart. Discharged home on 32 units of Lantus each bedtime.  3.   Bilateral diabetic foot ulcers complicated by osteomyelitis:  Has been on doxycycline for several weeks, prescribed by wound clinic. Wounds continue to be non-healing, likely because of poor circulation and diabetes. Osteomyelitis on radiography. OR 5/25 by ortho for debridement of both feet and first ray amputation in right foot by Dr. Lajoyce Corners. Patient was supposed to followup with wound clinic on Friday, May 23. At that visit, they were planning to perform extensive sharp debridement. He will need to followup with the wound clinic once discharged from the care of Dr. Lajoyce Corners. Patient discharged with a wheelchair. Should have limited weightbearing on both feet until followup with Dr. Lajoyce Corners. Clindamycin as noted above. At Dr. Audrie Lia requests, patient started on warfarin for DVT prophylaxis for one month postoperatively. Patient will come to the clinic for an INR  check on Thursday, May 29. Followup with Dr. Lajoyce Corners in 2 weeks postop.  4.   Hypertension:  At home he takes lisinopril 40 mg daily. No changes made. Consider adding calcium channel blocker in the future to target proteinuria.  5.   Diarrhea:  Resolved. C diff PCR not collected and diarrhea and never reoccurred.  6.   Acute bronchitis:  Reports 2 days of cough without dyspnea. Some sputum production with cough. No evidence of pneumonia on chest xray and lung auscultation is normal. Will treat symptomatically since this is almost certainly viral in nature.   7.   Depression:  Stable. At home he takes fluoxetine 10 mg daily. No changes made.  8.   Hyponatremia:  Sodium low on BMET from 5/23 at 131, but this corrected to 134; accounting for hyperglycemia. Sodium remains low despite better glycemic control: 130 on 5/25 and 131 on 5/26. 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   DISCHARGE DATA: Vital Signs: BP 133/66  Pulse 102  Temp(Src) 98.8 F (37.1 C) (Oral)  Resp 16  Ht 5\' 8"  (1.727 m)  Wt 185 lb 12.8 oz (84.278 kg)  BMI 28.26 kg/m2  SpO2 97%  Labs:  Results for orders placed during the hospital encounter of 08/28/12 (from the past 24 hour(s))  GLUCOSE, CAPILLARY     Status: Abnormal   Collection Time    09/02/12  5:01 PM      Result Value Range   Glucose-Capillary 238 (*) 70 - 99 mg/dL  GLUCOSE, CAPILLARY     Status: Abnormal   Collection Time    09/02/12  9:33 PM      Result Value Range   Glucose-Capillary 202 (*) 70 - 99 mg/dL   Comment 1 Documented in Chart     Comment 2 Notify RN    PROTIME-INR     Status: Abnormal   Collection Time    09/03/12  4:00 AM      Result Value Range   Prothrombin Time 20.9 (*) 11.6 - 15.2 seconds   INR 1.88 (*) 0.00 - 1.49  GLUCOSE, CAPILLARY     Status: Abnormal   Collection Time    09/03/12  7:34 AM      Result Value Range   Glucose-Capillary 176 (*) 70 - 99 mg/dL  GLUCOSE, CAPILLARY     Status: Abnormal    Collection Time    09/03/12 12:40 PM      Result Value Range   Glucose-Capillary 190 (*) 70 - 99 mg/dL    Time spent on discharge: 36 minutes   Services Ordered on Discharge: 1. PT - no 2. OT - no 3. RN - no 4. Other - wheelchair   Signed by:  Dorthula Rue. Earlene Plater, MD PGY-I, Internal Medicine  09/04/2012, 2:06 PM

## 2012-09-03 NOTE — Consult Note (Signed)
WOC follow-up: Pt now followed for assessment and plan of care by Dr Lajoyce Corners of the ortho service. Please re-consult if further assistance is needed.  Thank-you,  Cammie Mcgee MSN, RN, CWOCN, [place], CNS [phone]

## 2012-09-03 NOTE — Progress Notes (Signed)
Subjective:    Interval Events:  Patient feels well. He had an episode of diaphoresis overnight but no fevers. No chest pain or shortness of breath. No abdominal pain. Pain well controlled.    Objective:    Vital Signs:   Temp:  [98.4 F-99.5 F] 98.8 F (37.1 C) (05/27 0526) Pulse Rate:  [102-107] 102 (05/27 0526) Resp:  [16-20] 16 (05/27 0526) BP: (124-147)/(77-88) 138/81 mmHg (05/27 0526) SpO2:  [95 %-99 %] 97 % (05/27 0526)   Weights: Filed Weights   08/30/12 0510 08/31/12 0608 09/01/12 0546  Weight: 201 lb 15.1 oz (91.6 kg) 202 lb 13.2 oz (92 kg) 185 lb 12.8 oz (84.278 kg)    Intake/Output:   Gross per 24 hour  Intake    243 ml  Output   1110 ml  Net   -867 ml   Last BM Date: 08/28/12   Physical Exam: GENERAL: alert and oriented; resting comfortably in bed and in no distress EYES: pupils equal, round, and reactive to light; sclera anicteric ENT: moist mucosa LUNGS: clear to auscultation bilaterally, normal work of breathing HEART: normal rate; regular rhythm; normal S1 and S2, no S3 or S4 appreciated; no murmurs, rubs, or clicks ABDOMEN: soft, non-tender, normal bowel sounds, no masses palpated EXTREMITIES: no edema    Labs: Coagulation Studies:  09/02/12 0550 09/03/12 0400  LABPROT 16.0* 20.9*  INR 1.31 1.88*    Microbiology: Results for orders placed during the hospital encounter of 08/28/12  CULTURE, BLOOD (ROUTINE X 2)     Status: None   Collection Time    08/28/12  8:45 PM      Result Value Range Status   Specimen Description BLOOD ARM RIGHT   Final   Special Requests BOTTLES DRAWN AEROBIC AND ANAEROBIC 10CC   Final   Culture  Setup Time 08/29/2012 05:08   Final   Culture     Final   Value:        BLOOD CULTURE RECEIVED NO GROWTH TO DATE CULTURE WILL BE HELD FOR 5 DAYS BEFORE ISSUING A FINAL NEGATIVE REPORT   Report Status PENDING   Incomplete  CULTURE, BLOOD (ROUTINE X 2)     Status: None   Collection Time    08/28/12  8:55 PM   Result Value Range Status   Specimen Description BLOOD ARM LEFT   Final   Special Requests BOTTLES DRAWN AEROBIC AND ANAEROBIC 10CC   Final   Culture  Setup Time 08/29/2012 05:08   Final   Culture     Final   Value:        BLOOD CULTURE RECEIVED NO GROWTH TO DATE CULTURE WILL BE HELD FOR 5 DAYS BEFORE ISSUING A FINAL NEGATIVE REPORT   Report Status PENDING   Incomplete  ANAEROBIC CULTURE     Status: None   Collection Time    08/28/12  9:50 PM      Result Value Range Status   Specimen Description ABSCESS HAND LEFT   Final   Special Requests PATIENT ON FOLLOWING CLEOCIN   Final   Gram Stain     Final   Value: FEW WBC PRESENT,BOTH PMN AND MONONUCLEAR     NO SQUAMOUS EPITHELIAL CELLS SEEN     FEW GRAM POSITIVE COCCI     IN PAIRS   Culture NO ANAEROBES ISOLATED   Final   Report Status 09/02/2012 FINAL   Final  CULTURE, ROUTINE-ABSCESS     Status: None   Collection Time  08/28/12  9:50 PM      Result Value Range Status   Specimen Description ABSCESS HAND LEFT   Final   Special Requests PATIENT ON FOLLOWING CLEOCIN   Final   Gram Stain     Final   Value: FEW WBC PRESENT,BOTH PMN AND MONONUCLEAR     NO SQUAMOUS EPITHELIAL CELLS SEEN     FEW GRAM POSITIVE COCCI     IN PAIRS   Culture     Final   Value: MODERATE GROUP B STREP(S.AGALACTIAE)ISOLATED     Note: TESTING AGAINST S. AGALACTIAE NOT ROUTINELY PERFORMED DUE TO PREDICTABILITY OF AMP/PEN/VAN SUSCEPTIBILITY.   Report Status 08/31/2012 FINAL   Final  SURGICAL PCR SCREEN     Status: None   Collection Time    08/30/12 10:35 AM      Result Value Range Status   MRSA, PCR NEGATIVE  NEGATIVE Final   Staphylococcus aureus NEGATIVE  NEGATIVE Final   Comment:            The Xpert SA Assay (FDA     approved for NASAL specimens     in patients over 75 years of age),     is one component of     a comprehensive surveillance     program.  Test performance has     been validated by [employer] for patients greater     than or equal  to 4 year old.     It is not intended     to diagnose infection nor to     guide or monitor treatment.    Imaging: No results found.    Medications:    Infusions: . sodium chloride 10 mL/hr at 09/01/12 1229     Scheduled Medications: . clindamycin  450 mg Oral Q8H  . FLUoxetine  10 mg Oral Daily  . gabapentin  300 mg Oral TID  . heparin  5,000 Units Subcutaneous Q8H  . insulin aspart  0-20 Units Subcutaneous TID WC  . insulin aspart  0-5 Units Subcutaneous QHS  . insulin glargine  30 Units Subcutaneous QHS  . lisinopril  40 mg Oral Daily  . pantoprazole  40 mg Oral Daily  . polyethylene glycol  17 g Oral BID  . senna-docusate  1 tablet Oral BID  . sodium chloride  3 mL Intravenous Q12H  . Warfarin - Pharmacist Dosing Inpatient   Does not apply q1800     PRN Medications: alum & mag hydroxide-simeth, metoCLOPramide (REGLAN) injection, metoCLOPramide, ondansetron (ZOFRAN) IV, ondansetron, oxyCODONE-acetaminophen, zolpidem    Assessment/ Plan:    1.   Cellulitis and abscess of hand:  Infected blister, sustained while doing yard work.  Culture positive for Streptococcus agalactiae.  Clindamycin narrowed to cephalexin, now back to clindamycin with continued fevers.  OR on 5/21 and 5/23. - Clindamycin 450 mg TID - Oxycodone-acetaminophen 5-325 mg every 4 hours when necessary - Follow up with Dr. Melvyn Novas on Tuesday  2.   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  Reports excellent compliance since he received medicaid.  Hemoblogin A1c still high at 13.7 though.  Urine albumin-to-creatinine ratio elevated at 100.2 on 06/21/2012. Patient on lisinopril 40 mg daily. Patient also takes gabapentin 300 mg 3 times a day for diabetic neuropathy. CBGs much improved on 30U Lantus. - Continue Lantus 30U each bedtime - Continue resistant scale correctional insulin aspart with meals and at bedtime -  Continue gabapentin 300  mg 3 times a day  3.   Bilateral diabetic foot ulcers complicated by osteomyelitis:  Has been on doxycycline for several weeks, prescribed by wound clinic.  Wounds continue to be non-healing, likely because of poor circulation and diabetes.  Osteomyelitis on radiography.  OR 5/25 by ortho for debridement of both feet and ray amputation in right foot. - Follow up with Dr. Lajoyce Corners in 2 weeks - Clindamycin as noted above for 1 month - Warfarin per pharmacy, Dr. Audrie Lia request  4.   Hypertension:  At home he takes lisinopril 40 mg daily. On this now.  Blood pressures reasonably well controlled.  Consider adding calcium channel blocker in the future to target proteinuria. - Continue lisinopril 40 mg daily  5.   Diarrhea:  Resolved. C diff PCR not collected.  Order canceled.  Cancel enteric precautions.  6.   Acute bronchitis:  Reports 2 days of cough without dyspnea.  Some sputum production with cough.  No evidence of pneumonia on chest xray and lung auscultation is normal.  Will treat symptomatically since this is almost certainly viral in nature.  7.   Depression:  Stable. At home he takes fluoxetine 10 mg daily. This was held admission. We will restart this now. - Continue fluoxetine 10 mg daily  8.   Hyponatremia:  Sodium low on BMET from 5/23 at 131, but this correctedo 134; accounting for hyperglycemia.  Sodium remains low despite better glycemic control: 130 on 5/25 and 131 on 5/26.  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  9.   Prophylaxis: - Heparin 5,000 units [place] TID for VTE - Senna-docusate & PEG for bowel regimen - Pantoprazole   10. Disposition:  OPC patient.  Expect discharge  tomorrow.  OPC follow up established for June 3rd.  Patient needs to follow up with Dr. Lajoyce Corners in two weeks.    Length of Stay: 6 days   Signed by:  Dorthula Rue. Earlene Plater, MD PGY-I, Internal Medicine Pager [phone]  09/03/2012, 10:59 AM

## 2012-09-03 NOTE — Progress Notes (Signed)
Patient discharge teaching given, including activity, diet, follow-up appoints, and medications. Patient verbalized understanding of all discharge instructions. IV access was d/c'd. Vitals are stable. Skin is intact except as charted in most recent assessments. Pt to be escorted out by NT, to be driven home by family. 

## 2012-09-03 NOTE — Anesthesia Postprocedure Evaluation (Signed)
Anesthesia Post Note  Patient: [name]  Procedure(s) Performed: Procedure(s) (LRB): IRRIGATION AND DEBRIDEMENT EXTREMITY (Left)  Anesthesia type: general  Patient location: PACU  Post pain: Pain level controlled  Post assessment: Patient's Cardiovascular Status Stable   Post vital signs: Reviewed and stable  Level of consciousness: sedated  Complications: No apparent anesthesia complications

## 2012-09-03 NOTE — Progress Notes (Signed)
PT Note  Pt has received w/c and verbalizes understanding of it.  Pt will primarily use w/c in order to minimize weight bearing on feet.  [employer] PT [phone]

## 2012-09-03 NOTE — Progress Notes (Signed)
Pt reports an episode of night sweats last night that soaked the bed sheets. Temperature normal at that time.

## 2012-09-03 NOTE — Progress Notes (Signed)
ANTICOAGULATION CONSULT NOTE  Pharmacy Consult for Coumadin Indication: VTE prophylaxis  No Known Allergies  Patient Measurements: Height: 5\' 8"  (172.7 cm) Weight: 185 lb 12.8 oz (84.278 kg) IBW/kg (Calculated) : 68.4   Vital Signs: Temp: 98.8 F (37.1 C) (05/27 0526) Temp src: Oral (05/27 0526) BP: 133/66 mmHg (05/27 1139) Pulse Rate: 102 (05/27 0526)  Labs:  Recent Labs  09/01/12 0810 09/02/12 0550 09/03/12 0400  HGB 9.4* 8.6*  --   HCT 28.5* 26.6*  --   PLT 452* 452*  --   LABPROT  --  16.0* 20.9*  INR  --  1.31 1.88*  CREATININE 1.38* 1.42*  --     Estimated Creatinine Clearance: 68 ml/min (by C-G formula based on Cr of 1.42).   Medical History: Past Medical History  Diagnosis Date  . Onychomycosis   . Hypertension   . Diabetic peripheral neuropathy   . Diabetic foot ulcer 12/09    left plantar area  . History of drug abuse     Cocaine and marijuana  . Exposure to trichomonas     treated empirically  . Cellulitis of left foot 03/2008    left 4th and 5th metatarsal area  . Hyperlipidemia   . 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 Ulcerative esophagitis 2007    severe, complicated with UGI bleed  . Mallory Janann August tear April 2009  . History of prolonged Q-T interval on ECG   . 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  . 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 CKD (chronic kidney disease) stage 2, GFR 60-89 ml/min     BL SCr 1.3-1.4  . Depression   . Peripheral vascular disease   . Type 2 diabetes mellitus, uncontrolled, with renal complications   . GERD (gastroesophageal reflux disease)    Assessment: [name] is a 47 yo BM to start coumadin for VTE px s/p R foot first ray ampuation and L foot revision of transmetatarsal amputation. His coumadin predictor score = 9.  Daily INRs have been ordered by MD. His INR  was 1.07 12/04/2011.   INR this am has jumped significantly from 1.3>1.88.  No bleeding noted.   Goal of Therapy:  INR 2-3 Monitor platelets by anticoagulation protocol: Yes   Plan:  1. Reduce Coumadin to 7.5 mg po x 1 dose today 2. Continue Daily INR 3. He continues on heparin 5000 units sq q8h until INR tx 4. Coumadin education completed.  [employer], Pharm.D., BCPS Clinical Pharmacist Pager [phone] 09/03/2012 1:33 PM

## 2012-09-04 ENCOUNTER — Encounter (HOSPITAL_COMMUNITY): Payer: Self-pay | Admitting: Orthopedic Surgery

## 2012-09-04 LAB — CULTURE, BLOOD (ROUTINE X 2)

## 2012-09-05 NOTE — Progress Notes (Signed)
Patient referred to CSW for psychosocial needs but was d/c home today prior to CSW seeing him.  Per RNCM- patient returned home with spouse; no home health indicated.  CSW was unable to ascertain reason for referral. CSW signing off.  Lorri Frederick. [place]  [phone]

## 2012-09-10 ENCOUNTER — Emergency Department (HOSPITAL_COMMUNITY): Payer: Medicaid Other

## 2012-09-10 ENCOUNTER — Ambulatory Visit: Payer: Medicaid Other | Admitting: Internal Medicine

## 2012-09-10 ENCOUNTER — Inpatient Hospital Stay (HOSPITAL_COMMUNITY)
Admission: EM | Admit: 2012-09-10 | Discharge: 2012-09-16 | DRG: 853 | Disposition: A | Discharge status: Home / Self Care | Payer: Medicaid Other | Attending: Internal Medicine | Admitting: Internal Medicine

## 2012-09-10 ENCOUNTER — Other Ambulatory Visit: Payer: Self-pay | Admitting: Internal Medicine

## 2012-09-10 ENCOUNTER — Encounter (HOSPITAL_COMMUNITY): Payer: Self-pay | Admitting: Nurse Practitioner

## 2012-09-10 ENCOUNTER — Telehealth: Payer: Self-pay | Admitting: Internal Medicine

## 2012-09-10 DIAGNOSIS — E11621 Type 2 diabetes mellitus with foot ulcer: Secondary | ICD-10-CM | POA: Diagnosis present

## 2012-09-10 DIAGNOSIS — E1140 Type 2 diabetes mellitus with diabetic neuropathy, unspecified: Secondary | ICD-10-CM | POA: Diagnosis present

## 2012-09-10 DIAGNOSIS — N058 Unspecified nephritic syndrome with other morphologic changes: Secondary | ICD-10-CM | POA: Diagnosis present

## 2012-09-10 DIAGNOSIS — Z905 Acquired absence of kidney: Secondary | ICD-10-CM

## 2012-09-10 DIAGNOSIS — K219 Gastro-esophageal reflux disease without esophagitis: Secondary | ICD-10-CM | POA: Diagnosis present

## 2012-09-10 DIAGNOSIS — D72829 Elevated white blood cell count, unspecified: Secondary | ICD-10-CM | POA: Diagnosis present

## 2012-09-10 DIAGNOSIS — E1165 Type 2 diabetes mellitus with hyperglycemia: Secondary | ICD-10-CM | POA: Diagnosis present

## 2012-09-10 DIAGNOSIS — A419 Sepsis, unspecified organism: Principal | ICD-10-CM | POA: Diagnosis present

## 2012-09-10 DIAGNOSIS — Z794 Long term (current) use of insulin: Secondary | ICD-10-CM

## 2012-09-10 DIAGNOSIS — Z833 Family history of diabetes mellitus: Secondary | ICD-10-CM

## 2012-09-10 DIAGNOSIS — L97509 Non-pressure chronic ulcer of other part of unspecified foot with unspecified severity: Secondary | ICD-10-CM

## 2012-09-10 DIAGNOSIS — E118 Type 2 diabetes mellitus with unspecified complications: Secondary | ICD-10-CM | POA: Diagnosis present

## 2012-09-10 DIAGNOSIS — I10 Essential (primary) hypertension: Secondary | ICD-10-CM | POA: Diagnosis present

## 2012-09-10 DIAGNOSIS — N182 Chronic kidney disease, stage 2 (mild): Secondary | ICD-10-CM | POA: Diagnosis present

## 2012-09-10 DIAGNOSIS — IMO0002 Reserved for concepts with insufficient information to code with codable children: Secondary | ICD-10-CM | POA: Diagnosis present

## 2012-09-10 DIAGNOSIS — S98139A Complete traumatic amputation of one unspecified lesser toe, initial encounter: Secondary | ICD-10-CM

## 2012-09-10 DIAGNOSIS — I129 Hypertensive chronic kidney disease with stage 1 through stage 4 chronic kidney disease, or unspecified chronic kidney disease: Secondary | ICD-10-CM | POA: Diagnosis present

## 2012-09-10 DIAGNOSIS — E785 Hyperlipidemia, unspecified: Secondary | ICD-10-CM | POA: Diagnosis present

## 2012-09-10 DIAGNOSIS — M869 Osteomyelitis, unspecified: Secondary | ICD-10-CM

## 2012-09-10 DIAGNOSIS — E1142 Type 2 diabetes mellitus with diabetic polyneuropathy: Secondary | ICD-10-CM | POA: Diagnosis present

## 2012-09-10 DIAGNOSIS — B351 Tinea unguium: Secondary | ICD-10-CM | POA: Diagnosis present

## 2012-09-10 DIAGNOSIS — Z7901 Long term (current) use of anticoagulants: Secondary | ICD-10-CM

## 2012-09-10 DIAGNOSIS — E1159 Type 2 diabetes mellitus with other circulatory complications: Secondary | ICD-10-CM

## 2012-09-10 DIAGNOSIS — Z89511 Acquired absence of right leg below knee: Secondary | ICD-10-CM

## 2012-09-10 DIAGNOSIS — E1169 Type 2 diabetes mellitus with other specified complication: Secondary | ICD-10-CM

## 2012-09-10 DIAGNOSIS — S68118A Complete traumatic metacarpophalangeal amputation of other finger, initial encounter: Secondary | ICD-10-CM

## 2012-09-10 DIAGNOSIS — E871 Hypo-osmolality and hyponatremia: Secondary | ICD-10-CM | POA: Diagnosis present

## 2012-09-10 DIAGNOSIS — F329 Major depressive disorder, single episode, unspecified: Secondary | ICD-10-CM | POA: Diagnosis present

## 2012-09-10 DIAGNOSIS — E1149 Type 2 diabetes mellitus with other diabetic neurological complication: Secondary | ICD-10-CM | POA: Diagnosis present

## 2012-09-10 DIAGNOSIS — K5900 Constipation, unspecified: Secondary | ICD-10-CM | POA: Diagnosis present

## 2012-09-10 DIAGNOSIS — F1210 Cannabis abuse, uncomplicated: Secondary | ICD-10-CM | POA: Diagnosis present

## 2012-09-10 DIAGNOSIS — K859 Acute pancreatitis without necrosis or infection, unspecified: Secondary | ICD-10-CM | POA: Diagnosis not present

## 2012-09-10 DIAGNOSIS — R197 Diarrhea, unspecified: Secondary | ICD-10-CM

## 2012-09-10 DIAGNOSIS — F17200 Nicotine dependence, unspecified, uncomplicated: Secondary | ICD-10-CM | POA: Diagnosis present

## 2012-09-10 DIAGNOSIS — F3289 Other specified depressive episodes: Secondary | ICD-10-CM | POA: Diagnosis present

## 2012-09-10 LAB — BASIC METABOLIC PANEL
CO2: 24 mEq/L (ref 19–32)
Calcium: 9 mg/dL (ref 8.4–10.5)
Chloride: 95 mEq/L — ABNORMAL LOW (ref 96–112)
Creatinine, Ser: 1.46 mg/dL — ABNORMAL HIGH (ref 0.50–1.35)
Glucose, Bld: 469 mg/dL — ABNORMAL HIGH (ref 70–99)

## 2012-09-10 LAB — CBC
HCT: 25.4 % — ABNORMAL LOW (ref 39.0–52.0)
MCH: 28.4 pg (ref 26.0–34.0)
MCV: 85.8 fL (ref 78.0–100.0)
Platelets: 715 10*3/uL — ABNORMAL HIGH (ref 150–400)
RDW: 13.3 % (ref 11.5–15.5)
WBC: 22.3 10*3/uL — ABNORMAL HIGH (ref 4.0–10.5)

## 2012-09-10 MED ORDER — ACETAMINOPHEN 325 MG PO TABS
650.0000 mg | ORAL_TABLET | Freq: Once | ORAL | Status: AC
  Administered 2012-09-10: 650 mg via ORAL
  Filled 2012-09-10: qty 2

## 2012-09-10 MED ORDER — WARFARIN - PHARMACIST DOSING INPATIENT: Freq: Every day | Status: DC

## 2012-09-10 NOTE — ED Notes (Signed)
Was directed to remove bandages from a patient that had recent amputation surgery on both feet. Job Completed

## 2012-09-10 NOTE — Telephone Encounter (Signed)
Mr. Sheetz has NS for lab draw and for clinic appointment for HFU.  I attempted to call him at the number provided, but there was no outgoing message to confirm correct number.  No voicemail was left.  If Mr. Delone should contact the clinic, he should be set up for a follow up appointment to assess his recent surgical interventions and diabetes control.  He does have frequent follow up with wound care, which I can only assume he is keeping.  Will cancel pending labs at this time until further information is available.

## 2012-09-10 NOTE — ED Provider Notes (Signed)
History     CSN: [id number]  Arrival date & time 09/10/12  1800   First MD Initiated Contact with Patient 09/10/12 2041      Chief Complaint  Patient presents with  . Wound Infection    (Consider location/radiation/quality/duration/timing/severity/associated sxs/prior treatment) HPI Comments: [name] is a 47 y.o. male who complains of drainage and odor from his right foot. Wound appeared he had I&D of the right foot for infection about 2 weeks ago. He is taking an oral antibiotic, clindamycin, without relief. He, states that his sugars have been running in the 400s. He denies anorexia. He has had nocturnal fever for several days. He was recently discharged from the hospital. He, states that the odor got worse 3 days ago. He was due to followup with his orthopedist, for checkup in 1 day. There are no other known modifying factors  The history is provided by the patient.    Past Medical History  Diagnosis Date  . Onychomycosis   . Hypertension   . Diabetic peripheral neuropathy   . Diabetic foot ulcer     s/p Right first ray amputation, left transmetatarsal amputation with revision  . History of drug abuse     Cocaine and marijuana  . Exposure to trichomonas     treated empirically  . Cellulitis of left foot 03/2008    left 4th and 5th metatarsal area  . Hyperlipidemia   . 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 Ulcerative esophagitis 2007    severe, complicated with UGI bleed  . Mallory Janann August tear April 2009  . History of prolonged Q-T interval on ECG   . 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  . 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 CKD (chronic kidney disease) stage 2, GFR 60-89 ml/min     BL SCr 1.3-1.4  . Depression   . Peripheral vascular disease   . Type 2 diabetes mellitus, uncontrolled, with renal complications   . GERD  (gastroesophageal reflux disease)     Past Surgical History  Procedure Laterality Date  . Toe amputation  06/2011    left; 4th toe  . Nephrectomy      L side  . I&d extremity  08/24/2011    Procedure: IRRIGATION AND DEBRIDEMENT EXTREMITY;  Surgeon: Cammy Copa, MD;  Location: [facility] OR;  Service: Orthopedics;  Laterality: Left;  . Amputation  08/24/2011    Procedure: AMPUTATION RAY;  Surgeon: Cammy Copa, MD;  Location: [facility] OR;  Service: Orthopedics;  Laterality: Left;  left fourth toe Ray Resection  . Amputation  12/05/2011    Procedure: AMPUTATION RAY;  Surgeon: Cammy Copa, MD;  Location: [facility] OR;  Service: Orthopedics;  Laterality: Left;  . I&d extremity  02/18/2012    Procedure: IRRIGATION AND DEBRIDEMENT EXTREMITY;  Surgeon: Kathryne Hitch, MD;  Location: [facility] OR;  Service: Orthopedics;  Laterality: Left;  . Amputation  02/18/2012    Procedure: AMPUTATION DIGIT;  Surgeon: Kathryne Hitch, MD;  Location: [facility] OR;  Service: Orthopedics;  Laterality: Left;  revison transmetatarsal  . I&d extremity Left 08/28/2012    Procedure: IRRIGATION AND DEBRIDEMENT EXTREMITY;  Surgeon: Sharma Covert, MD;  Location: [facility] OR;  Service: Orthopedics;  Laterality: Left;  CYSTO TUBING/IRRIGATION, LEAD HAND.  . I&d extremity Left 08/30/2012    Procedure: IRRIGATION AND DEBRIDEMENT EXTREMITY;  Surgeon: Thomasene Ripple  Melvyn Novas, MD;  Location: [facility] OR;  Service: Orthopedics;  Laterality: Left;  . Amputation Right 09/01/2012    Procedure: FOOT 1ST RAY AMPUTATION;  Surgeon: Nadara Mustard, MD;  Location: [facility] OR;  Service: Orthopedics;  Laterality: Right;  . Amputation Left 09/01/2012    Procedure: FOOT REVISION TRANSMETATARSAL AMPUTATION ;  Surgeon: Nadara Mustard, MD;  Location: [facility] OR;  Service: Orthopedics;  Laterality: Left;    Family History  Problem Relation Age of Onset  . Diabetes type II Mother   . Pancreatic cancer Father     History  Substance Use Topics  . Smoking status: Current Some  Day Smoker -- 0.50 packs/day for 34 years    Types: Cigarettes  . Smokeless tobacco: Never Used     Comment: Has stopped 2 weeks ago- QUIT line info given to pt and wife for asst with smoking patchers, etc  . Alcohol Use: Yes     Comment: 12/05/2011 "occasionally; maybe New Years"      Review of Systems  All other systems reviewed and are negative.    Allergies  Review of patient's allergies indicates no known allergies.  Home Medications   No current outpatient prescriptions on file.  BP 135/74  Pulse 103  Temp(Src) 99.1 F (37.3 C) (Oral)  Resp 18  Ht 5' 8.11" (1.73 m)  Wt 190 lb 4.8 oz (86.32 kg)  BMI 28.84 kg/m2  SpO2 100%  Physical Exam  Nursing note and vitals reviewed. Constitutional: He is oriented to person, place, and time. He appears well-developed and well-nourished.  HENT:  Head: Normocephalic and atraumatic.  Right Ear: External ear normal.  Left Ear: External ear normal.  Eyes: Conjunctivae and EOM are normal. Pupils are equal, round, and reactive to light.  Neck: Normal range of motion and phonation normal. Neck supple.  Cardiovascular: Normal rate, regular rhythm, normal heart sounds and intact distal pulses.   Pulmonary/Chest: Effort normal and breath sounds normal. He exhibits no bony tenderness.  Abdominal: Soft. Normal appearance. There is no tenderness.  Musculoskeletal: Normal range of motion.  Surgical right medial foot is gaping, and draining, purulent material with intact sutures. No proximal streaking or redness. Small right inguinal node that is nontender. Left foot has healing surgical wound. Left hand has healing surgical wound without apparent infection.  Neurological: He is alert and oriented to person, place, and time. He has normal strength. No cranial nerve deficit or sensory deficit. He exhibits normal muscle tone. Coordination normal.  Skin: Skin is warm, dry and intact.  Psychiatric: He has a normal mood and affect. His behavior is  normal. Judgment and thought content normal.    ED Course  Procedures (including critical care time)  Labs Reviewed  BASIC METABOLIC PANEL - Abnormal; Notable for the following:    Sodium 129 (*)    Chloride 95 (*)    Glucose, Bld 469 (*)    Creatinine, Ser 1.46 (*)    GFR calc non Af Amer 56 (*)    GFR calc Af Amer 64 (*)    All other components within normal limits  CBC - Abnormal; Notable for the following:    WBC 22.3 (*)    RBC 2.96 (*)    Hemoglobin 8.4 (*)    HCT 25.4 (*)    Platelets 715 (*)    All other components within normal limits  GLUCOSE, CAPILLARY - Abnormal; Notable for the following:    Glucose-Capillary 424 (*)    All other components within  normal limits  PROTIME-INR - Abnormal; Notable for the following:    Prothrombin Time 34.2 (*)    INR 3.65 (*)    All other components within normal limits  BASIC METABOLIC PANEL - Abnormal; Notable for the following:    Sodium 130 (*)    Glucose, Bld 290 (*)    GFR calc non Af Amer 64 (*)    GFR calc Af Amer 74 (*)    All other components within normal limits  CBC - Abnormal; Notable for the following:    WBC 17.8 (*)    RBC 2.79 (*)    Hemoglobin 7.8 (*)    HCT 23.8 (*)    Platelets 649 (*)    All other components within normal limits  GLUCOSE, CAPILLARY - Abnormal; Notable for the following:    Glucose-Capillary 392 (*)    All other components within normal limits  PROTIME-INR - Abnormal; Notable for the following:    Prothrombin Time 30.3 (*)    INR 3.10 (*)    All other components within normal limits  GLUCOSE, CAPILLARY - Abnormal; Notable for the following:    Glucose-Capillary 275 (*)    All other components within normal limits  GLUCOSE, CAPILLARY - Abnormal; Notable for the following:    Glucose-Capillary 323 (*)    All other components within normal limits  GLUCOSE, CAPILLARY - Abnormal; Notable for the following:    Glucose-Capillary 314 (*)    All other components within normal limits   WOUND CULTURE  CULTURE, BLOOD (ROUTINE X 2)  CULTURE, BLOOD (ROUTINE X 2)  PROTIME-INR   Dg Foot Complete Right  09/10/2012   *RADIOLOGY REPORT*  Clinical Data: Worsening diabetic foot wound. Surgery on 08/31/2012.  RIGHT FOOT COMPLETE - 3+ VIEW  Comparison: 08/30/2012.  Findings: When compared to the prior examination there has been interval resection of the remaining first proximal phalanx and the entire first metatarsal.  Overlying soft tissues are irregular, and there is extensive gas within the soft tissues at the site of resection.  No definite destructive bony lesions are identified at this time.  No acute displaced fracture, subluxation or dislocation is noted.  IMPRESSION: 1.  Postoperative changes related to recent first ray amputation. Extensive gas in the soft tissues in the resection bed may simply be postoperative, however, clinical correlation for signs and symptoms of soft tissue infection is strongly recommended.   Original Report Authenticated By: Trudie Reed, M.D.     1. Sepsis   2. Chronic kidney disease, stage 2, mildly decreased GFR   3. Diabetic foot ulcer   4. Tobacco use disorder   5. Type II or unspecified type diabetes mellitus with neurological manifestations, not stated as uncontrolled(250.60)   6. Unspecified essential hypertension   7. Depression   8. Dyslipidemia   9. GERD (gastroesophageal reflux disease)       MDM  Infected right foot surgical wound , he is failing outpatient treatment. Will admit for inpatient management, and parenteral ABX        Flint Melter, MD 09/11/12 1739

## 2012-09-10 NOTE — ED Notes (Addendum)
Pt was here last week for L toe amputation due to infection, was given iv antibiotics. Reports since then increasing foul drainage from L foot and L hand, blood sugar has been running high, has been taking oral antibiotics at home as directed

## 2012-09-10 NOTE — H&P (Signed)
Hospital Admission Note Date: 09/11/2012  Patient name: [name] Medical record number: [id number] Date of birth: [birth year] Age: 47 y.o. Gender: male PCP: Dow Adolph, MD  Medical Service: Internal medicine teaching service  Attending physician: Dr Eben Burow    1st Contact: Dr Burtis Junes    Pager: [phone] 2nd Contact: Dr Clyde Lundborg    Pager: [phone] After 5 pm or weekends: 1st Contact:      Pager: [phone] 2nd Contact:      Pager: [phone]  Chief Complaint: Fever and right leg pain  History of Present Illness: [name] is 47 y.o. male with a PMHx of uncontrolled DM2 with multiple complications (A1c 13.7), CKD 2-3, BL toe amputations followed by bilateral non-healing ulcerations, polysubstance abuse, hypertension, and depression.   He was recently hospitalized (5/21-5/27) for left hand cellulitis and diabetic foot ulcerations complicated with osteomyelitis.  05/21: Lt hand I&D for deep palmar abscess by Dr Melvyn Novas  05/23: Lt thumb I&D for deep abscess by Dr. Melvyn Novas  05/23: Rt foot x-ray on recurrent osteomyelitis involving the first proximal phalanx by Dr Lajoyce Corners  5/25: Right foot first ray amputations and revision of the left foot transmetatarsal amputation by Dr. Lajoyce Corners.   5/24: Wound (Lt Hand) Cultures - Streptococcus agalactiae. Discharged on PO Clindamycin 300 mg tid X 1 month.   [name] reports that over the past 4 days he's been experiencing high-grade fevers with 103.9 2 days ago, associated with increasing pain in his right leg with increased drainage from the wound. He can not bare any weight on the right leg due to severe pain- the pain is constant, 8/10. The pain is relieved by resting the leg. He also had 2 episodes of non bloody emesis on the day of admission. He has been having chills, fatigue, without shortness of breath. He reports some dizziness, but has not had any syncopal episode. His appetite has been poor. He was able to fill clindamycin prescription and he has been  compliant with it - also wife confirms this. His left hand wound has improved significantly and he was evaluated at Dr Glenna Durand office on 5/29. He had not seen Dr Lajoyce Corners yet (supposed to see him after 2 weeks POD) and his dressing had been changed since he was discharged.  Meds: Current Facility-Administered Medications  Medication Dose Route Frequency Provider Last Rate Last Dose  . 0.9 %  sodium chloride infusion   Intravenous Continuous Neema K Sharda, MD      . oxyCODONE (Oxy IR/ROXICODONE) immediate release tablet 5 mg  5 mg Oral Q4H PRN Belia Heman, MD      . Warfarin - Pharmacist Dosing Inpatient   Does not apply q1800 Severiano Gilbert, [facility]       Current Outpatient Prescriptions  Medication Sig Dispense Refill  . ACCU-CHEK FASTCLIX LANCETS MISC 1 each by Does not apply route daily before breakfast.  102 each  2  . acetaminophen (TYLENOL) 500 MG tablet Take 1,000 mg by mouth daily as needed for pain. In the morning      . atorvastatin (LIPITOR) 20 MG tablet Take 1 tablet (20 mg total) by mouth daily.  30 tablet  0  . calcium carbonate (TUMS - DOSED IN MG ELEMENTAL CALCIUM) 500 MG chewable tablet Chew 2 tablets by mouth daily as needed for heartburn.      . clindamycin (CLEOCIN) 300 MG capsule Take 1 capsule (300 mg total) by mouth 3 (three) times daily.  90 capsule  0  .  diphenhydramine-acetaminophen (TYLENOL PM) 25-500 MG TABS Take 1 tablet by mouth at bedtime as needed (sleep/pain).      Marland Kitchen FLUoxetine (PROZAC) 10 MG capsule Take 1 capsule (10 mg total) by mouth daily.  90 capsule  0  . gabapentin (NEURONTIN) 300 MG capsule Take 1 capsule (300 mg total) by mouth 3 (three) times daily.  90 capsule  2  . glucose blood (ACCU-CHEK SMARTVIEW) test strip Use as instructed  100 each  12  . insulin glargine (LANTUS) 100 UNIT/ML injection Inject 32 Units into the skin at bedtime.      Marland Kitchen lisinopril (PRINIVIL,ZESTRIL) 20 MG tablet Take 2 tablets (40 mg total) by mouth daily.  90 tablet  0  .  oxyCODONE-acetaminophen (PERCOCET/ROXICET) 5-325 MG per tablet Take 1 tablet by mouth every 4 (four) hours as needed for pain.  20 tablet  0  . pantoprazole (PROTONIX) 40 MG tablet Take 1 tablet (40 mg total) by mouth daily.  90 tablet  11  . promethazine (PHENERGAN) 25 MG tablet Take 1 tablet (25 mg total) by mouth every 6 (six) hours as needed for nausea.  20 tablet  0  . warfarin (COUMADIN) 5 MG tablet Take 1 tablet (5 mg total) by mouth daily.  30 tablet  0    Allergies: Allergies as of 09/10/2012  . (No Known Allergies)   Past Medical History  Diagnosis Date  . Onychomycosis   . Hypertension   . Diabetic peripheral neuropathy   . Diabetic foot ulcer 12/09    left plantar area  . History of drug abuse     Cocaine and marijuana  . Exposure to trichomonas     treated empirically  . Cellulitis of left foot 03/2008    left 4th and 5th metatarsal area  . Hyperlipidemia   . 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 Ulcerative esophagitis 2007    severe, complicated with UGI bleed  . Mallory Janann August tear April 2009  . History of prolonged Q-T interval on ECG   . 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  . 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 CKD (chronic kidney disease) stage 2, GFR 60-89 ml/min     BL SCr 1.3-1.4  . Depression   . Peripheral vascular disease   . Type 2 diabetes mellitus, uncontrolled, with renal complications   . GERD (gastroesophageal reflux disease)    Past Surgical History  Procedure Laterality Date  . Toe amputation  06/2011    left; 4th toe  . Nephrectomy      L side  . I&d extremity  08/24/2011    Procedure: IRRIGATION AND DEBRIDEMENT EXTREMITY;  Surgeon: Cammy Copa, MD;  Location: [facility] OR;  Service: Orthopedics;  Laterality: Left;  . Amputation  08/24/2011    Procedure: AMPUTATION RAY;  Surgeon: Cammy Copa, MD;  Location: [facility] OR;  Service: Orthopedics;  Laterality: Left;  left fourth toe Ray Resection  . Amputation  12/05/2011    Procedure: AMPUTATION RAY;  Surgeon: Cammy Copa, MD;  Location: [facility] OR;  Service: Orthopedics;  Laterality: Left;  . I&d extremity  02/18/2012    Procedure: IRRIGATION AND DEBRIDEMENT EXTREMITY;  Surgeon: Kathryne Hitch, MD;  Location: [facility] OR;  Service: Orthopedics;  Laterality: Left;  . Amputation  02/18/2012    Procedure: AMPUTATION DIGIT;  Surgeon: Kathryne Hitch, MD;  Location: [facility] OR;  Service: Orthopedics;  Laterality: Left;  revison transmetatarsal  . I&d extremity Left 08/28/2012    Procedure: IRRIGATION AND DEBRIDEMENT EXTREMITY;  Surgeon: Sharma Covert, MD;  Location: [facility] OR;  Service: Orthopedics;  Laterality: Left;  CYSTO TUBING/IRRIGATION, LEAD HAND.  . I&d extremity Left 08/30/2012    Procedure: IRRIGATION AND DEBRIDEMENT EXTREMITY;  Surgeon: Sharma Covert, MD;  Location: [facility] OR;  Service: Orthopedics;  Laterality: Left;  . Amputation Right 09/01/2012    Procedure: FOOT 1ST RAY AMPUTATION;  Surgeon: Nadara Mustard, MD;  Location: [facility] OR;  Service: Orthopedics;  Laterality: Right;  . Amputation Left 09/01/2012    Procedure: FOOT REVISION TRANSMETATARSAL AMPUTATION ;  Surgeon: Nadara Mustard, MD;  Location: [facility] OR;  Service: Orthopedics;  Laterality: Left;   Family History  Problem Relation Age of Onset  . Diabetes type II Mother   . Pancreatic cancer Father    History   Social History  . Marital Status: Married    Spouse Name: N/A    Number of Children: N/A  . Years of Education: N/A   Occupational History  . Not on file.   Social History Main Topics  . Smoking status: Current Some Day Smoker -- 0.50 packs/day for 34 years    Types: Cigarettes  . Smokeless tobacco: Never Used     Comment: Has stopped 2 weeks ago- QUIT line info given to pt and wife for asst with smoking patchers, etc  . Alcohol Use: Yes     Comment: 12/05/2011  "occasionally; maybe New Years"  . Drug Use: Yes    Special: Marijuana     Comment: 12/05/2011 "last time ~ 2 wk ago"  . Sexually Active: Yes   Other Topics Concern  . Not on file   Social History Narrative  . No narrative on file    Review of Systems: Constitutional: Reports fever, chills, appetite change and fatigue. No diaphoresis.  HEENT: Denies photophobia, eye pain, redness, hearing loss, ear pain, congestion, sore throat, rhinorrhea, sneezing, mouth sores, trouble swallowing, neck pain, neck stiffness and tinnitus. He reports reduced vision in his eyes which is chronic. Respiratory: Denies SOB, DOE, cough, chest tightness, and wheezing.  Cardiovascular: Denies chest pain, palpitations and leg swelling.  Gastrointestinal: Denies abdominal pain, diarrhea, but he reports that he has been constipated since he left the hospital. No blood in stool and abdominal distention.  Genitourinary: Denies dysuria, urgency, frequency, hematuria, flank pain and difficulty urinating.  Musculoskeletal: Denies myalgias, joint swelling, arthralgias and gait problem.  Skin: Denies pallor or rashes.  Neurological: Denies dizziness, seizures, syncope, weakness, lightheadedness, numbness and headaches.  Hematological: Denies adenopathy. Easy bruising, personal or family bleeding history. Psychiatric/Behavioral: Denies suicidal ideation, mood changes, confusion, nervousness, sleep disturbance and agitation  Physical Exam: Blood pressure 141/86, pulse 109, temperature 101.4 F (38.6 C), temperature source Oral, resp. rate 16, SpO2 98.00%. GENERAL: well developed, well nourished; no acute distressed, cooperative with exam. Wife at bed side HEAD: atraumatic, normocephalic,  EYES: pupils equal, round and reactive; sclera anicteric; normal conjunctiva  NOSE/THROAT: oropharynx clear, moist mucous membranes, pink gums  NECK: supple, no carotid bruits, thyroid normal in size and without palpable nodules  LYMPH:  no cervical or supraclavicular lymphadenopathy.  LUNGS: clear to auscultation bilaterally, normal work of breathing  HEART: normal rate and regular rhythm; 2/6 systolic murmur heard best at the upper sternal borders with radiation to the right carotid  PULSES: radial and dorsalis pedis pulses are 2+ and  symmetric  ABDOMEN: Mild tenderness to palpation no rebound, guarding, or rigidity; normal bowel sounds; no masses or organomegaly  SKIN: warm, dry, intact, normal turgor, no rashes  EXTREMITIES: no peripheral edema, clubbing, or cyanosis.   Rt LE: Slight dehiscence of the surgical right foot wound with active drainage of pus. No active bleeding. The leg slightly swollen compared to the left from the level of foot to the midshin level. No noted erythema. There is no increased warmth. The pulses are present and strong. Lt LE: The surgical wound look clean without active drainage. There is no erythema, tenderness or increased swelling.   NEUROLOGIC: A&O X3, CN II - XII are grossly intact. Motor strength is 5/5 in the all 4 extremities, Sensations intact to light touch, Cerebellar signs negative.   PSYCH: patient is alert and oriented, mood and affect are normal and congruent, thought content is normal without delusions, thought process is linear, speech is normal and non-pressured, behavior is normal  Lab results: Basic Metabolic Panel:  Recent Labs  [birth year] 1816  NA 129*  K 4.8  CL 95*  CO2 24  GLUCOSE 469*  BUN 20  CREATININE 1.46*  CALCIUM 9.0   CBC:  Recent Labs  09/10/12 1816  WBC 22.3*  HGB 8.4*  HCT 25.4*  MCV 85.8  PLT 715*   CBG:  Recent Labs  09/10/12 1822  GLUCAP 424*   Hem Coagulation:  Recent Labs  09/10/12 2245  LABPROT 34.2*  INR 3.65*   Urine Drug Screen: Drugs of Abuse     Component Value Date/Time   LABOPIA NONE DETECTED 07/27/2012 0153   COCAINSCRNUR NONE DETECTED 07/27/2012 0153   COCAINSCRNUR NEG 09/22/2008 2125   LABBENZ NONE DETECTED  07/27/2012 0153   LABBENZ NEG 09/22/2008 2125   AMPHETMU NONE DETECTED 07/27/2012 0153   AMPHETMU NEG 09/22/2008 2125   THCU POSITIVE* 07/27/2012 0153   LABBARB NONE DETECTED 07/27/2012 0153     Imaging results:  Dg Foot Complete Right  09/10/2012   *RADIOLOGY REPORT*  Clinical Data: Worsening diabetic foot wound. Surgery on 08/31/2012.  RIGHT FOOT COMPLETE - 3+ VIEW  Comparison: 08/30/2012.  Findings: When compared to the prior examination there has been interval resection of the remaining first proximal phalanx and the entire first metatarsal.  Overlying soft tissues are irregular, and there is extensive gas within the soft tissues at the site of resection.  No definite destructive bony lesions are identified at this time.  No acute displaced fracture, subluxation or dislocation is noted.  IMPRESSION: 1.  Postoperative changes related to recent first ray amputation. Extensive gas in the soft tissues in the resection bed may simply be postoperative, however, clinical correlation for signs and symptoms of soft tissue infection is strongly recommended.   Original Report Authenticated By: Trudie Reed, M.D.    Assessment & Plan by Problem: 47 y.o. male with a PMHx of uncontrolled DM2 with multiple complications (A1c 13.7), CKD 2-3, BL toe amputations followed by bilateral non-healing ulcerations, polysubstance abuse, hypertension, and depression. He presents to the emergency department, with fevers and increased drainage from his post surgical right foot wound.  Principal Problem:   Sepsis due to right foot ulcer Active Problems:   Diabetes Type 2, Uncontroled   HYPERTENSION   Hyperlipedemia   CKD Stage 2   Diabetic foot ulcer   DIABETIC PERIPHERAL NEUROPATHY   TOBACCO ABUSE   GERD (gastroesophageal reflux disease)   1. Sepsis secondary to right foot ulcers Vs Osteomyelitis: Initial Temp=101.4, Pulse  116. Leukocytosis of 22. Rt foot x-ray on 09/01/2012 recurrent osteomyelitis involving the  first proximal phalanx and on 5/25 -right foot first ray amputations and revision of the left foot transmetatarsal amputation by Dr. Lajoyce Corners. Has been compliant with clindamycin. The right foot currently demonstrates signs of infection. Cellulitis and osteomyelitis cannot be excluded. He will require coverage for MRSA and Pseudomonas (due to uncontrolled DM) Plan. - Admit to MedSurg. - Repeat wound cultures - with swab. - Blood cultures drawn x2. - Complete x-ray of the right foot. - Consulted with orthopedic (Dr Otelia Sergeant who recommended imaging with x-ray). - Warfarin as recommended by surgery - will check INR  - Vancomycin, and cefepime - Acetominophen prn for fever - Oxycodone IR. - IV normal saline at 75 cc per hour. - Consider ID and Wound care  3. 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 Home Lantus dose was recently increased from 25 u to 32 units. Despite his good compliance with insulin, CBGs above 400 at home. On admission was 469. Urine albumin-to-creatinine ratio elevated at 100.2 on 06/21/2012. Patient on lisinopril 40 mg daily. Patient also takes gabapentin 300 mg 3 times a day for diabetic neuropathy.  Plan. -Lantus 30 units at bedtime. -Moderate sliding scale insulin. -CBGs every 4 hours while he is n.p.o. -Likely he will require more home insulin. -Gabapentin and lisinopril  -Carb modified diet.   4. Hypertension: At home he takes lisinopril 40 mg daily. BP elevated at 174/92 on admission.  Plan. -Lisinopril 40 mg daily.  5. Chronic Kidney disease stage 2:  Baseline creatinine around 1.3. Slightly increased to 1.46 with BUN increased from 12 to 20 over the last 1 week. Likely cause is prerenal in the acute setting of sepsis with a reduced oral intake and fevers. -Gentle rehydration with normal saline. -check bmet   6. Depression: Stable. At home he takes fluoxetine 10 mg daily.   7. Pseudo-Hyponatremia:  Sodium low on BMET 129. Corrects to 135 with the level of hyperglycemia.    8. Hyperlipidemia: LDL 06/21/12 129. Will continue with Lipitor 40mg  daily.  9. Constipation: He reports that he has not had a BM since he was discharged on 5/27. Will start docusate.  10. Insomnia: He reports poor sleep. Ambien 5 mg qhs prn.  Dispo: Disposition is deferred at this time, awaiting improvement of current medical problems. Anticipated discharge in approximately 2-3 day(s).   The patient does have a current PCP Zada Girt, Seynabou Fults, MD), therefore is requiring OPC follow-up after discharge.   The patient does have transportation limitations that hinder transportation to clinic appointments.  Signed: Signed:  Dow Adolph, MD PGY-1 Internal Medicine Teaching Service Pager: [phone] 09/11/2012, 12:10 AM

## 2012-09-10 NOTE — ED Notes (Signed)
NURSE FIRST ROUNDS : NURSE EXPLAINED DELAY / WAIT TIME / PROCESS TO PT. RESPIRATIONS UNLABORED / DENIES PAIN AT THIS TIM E, WARM BLANKET PROVIDED.

## 2012-09-10 NOTE — ED Notes (Signed)
Admitting MD at bedside, pt awaiting inpt beds assignment.  

## 2012-09-10 NOTE — Progress Notes (Signed)
ANTICOAGULATION CONSULT NOTE - Initial Consult  Pharmacy Consult for warfarin Indication: DVT px following ray amputation on 5/25  No Known Allergies  Vital Signs: Temp: 101.4 F (38.6 C) (06/03 2032) Temp src: Oral (06/03 2032) BP: 174/92 mmHg (06/03 2032) Pulse Rate: 86 (06/03 2032)  Labs:  Recent Labs  09/10/12 1816  HGB 8.4*  HCT 25.4*  PLT 715*  CREATININE 1.46*    The CrCl is unknown because both a height and weight (above a minimum accepted value) are required for this calculation.   Medical History: Past Medical History  Diagnosis Date  . Onychomycosis   . Hypertension   . Diabetic peripheral neuropathy   . Diabetic foot ulcer 12/09    left plantar area  . History of drug abuse     Cocaine and marijuana  . Exposure to trichomonas     treated empirically  . Cellulitis of left foot 03/2008    left 4th and 5th metatarsal area  . Hyperlipidemia   . 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 Ulcerative esophagitis 2007    severe, complicated with UGI bleed  . Mallory Janann August tear April 2009  . History of prolonged Q-T interval on ECG   . 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  . 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 CKD (chronic kidney disease) stage 2, GFR 60-89 ml/min     BL SCr 1.3-1.4  . Depression   . Peripheral vascular disease   . Type 2 diabetes mellitus, uncontrolled, with renal complications   . GERD (gastroesophageal reflux disease)   Assessment: 47 year old male s/p ray amputation on 5/25 and placed on coumadin for dvt prophylaxis. INR ordered for tonight. Patient reports already taking dose today. Will follow INR in am and resume warfarin as deemed appropriate.   Goal of Therapy:  INR 2-3 Monitor platelets by anticoagulation protocol: Yes   Plan:  Warfarin already taken by patient today prior to  admission. Follow daily INR  Sheppard Coil PharmD., BCPS Clinical Pharmacist Pager [phone] 09/10/2012 10:42 PM

## 2012-09-10 NOTE — ED Notes (Signed)
CBG was 424. Notified Nurse Ashleigh.

## 2012-09-11 ENCOUNTER — Other Ambulatory Visit (HOSPITAL_COMMUNITY): Payer: Self-pay | Admitting: Orthopedic Surgery

## 2012-09-11 ENCOUNTER — Encounter (HOSPITAL_COMMUNITY): Payer: Self-pay | Admitting: Internal Medicine

## 2012-09-11 DIAGNOSIS — E785 Hyperlipidemia, unspecified: Secondary | ICD-10-CM

## 2012-09-11 DIAGNOSIS — F329 Major depressive disorder, single episode, unspecified: Secondary | ICD-10-CM

## 2012-09-11 DIAGNOSIS — K219 Gastro-esophageal reflux disease without esophagitis: Secondary | ICD-10-CM

## 2012-09-11 LAB — CBC
MCHC: 32.8 g/dL (ref 30.0–36.0)
Platelets: 649 10*3/uL — ABNORMAL HIGH (ref 150–400)
RDW: 13.3 % (ref 11.5–15.5)
WBC: 17.8 10*3/uL — ABNORMAL HIGH (ref 4.0–10.5)

## 2012-09-11 LAB — BASIC METABOLIC PANEL
BUN: 19 mg/dL (ref 6–23)
Calcium: 8.7 mg/dL (ref 8.4–10.5)
Creatinine, Ser: 1.3 mg/dL (ref 0.50–1.35)
GFR calc Af Amer: 74 mL/min — ABNORMAL LOW (ref >90)
GFR calc non Af Amer: 64 mL/min — ABNORMAL LOW (ref >90)

## 2012-09-11 LAB — PROTIME-INR: Prothrombin Time: 30.3 seconds — ABNORMAL HIGH (ref 11.6–15.2)

## 2012-09-11 LAB — SURGICAL PCR SCREEN: MRSA, PCR: NEGATIVE

## 2012-09-11 LAB — GLUCOSE, CAPILLARY: Glucose-Capillary: 323 mg/dL — ABNORMAL HIGH (ref 70–99)

## 2012-09-11 MED ORDER — INSULIN GLARGINE 100 UNIT/ML ~~LOC~~ SOLN
36.0000 [IU] | Freq: Every day | SUBCUTANEOUS | Status: DC
  Administered 2012-09-11 – 2012-09-15 (×5): 36 [IU] via SUBCUTANEOUS
  Filled 2012-09-11 (×8): qty 0.36

## 2012-09-11 MED ORDER — SENNA 8.6 MG PO TABS
1.0000 | ORAL_TABLET | Freq: Two times a day (BID) | ORAL | Status: DC
  Administered 2012-09-11 (×2): 8.6 mg via ORAL
  Filled 2012-09-11 (×6): qty 1

## 2012-09-11 MED ORDER — GABAPENTIN 300 MG PO CAPS
300.0000 mg | ORAL_CAPSULE | Freq: Three times a day (TID) | ORAL | Status: DC
  Administered 2012-09-11 – 2012-09-16 (×15): 300 mg via ORAL
  Filled 2012-09-11 (×19): qty 1

## 2012-09-11 MED ORDER — OXYCODONE HCL 5 MG PO TABS
5.0000 mg | ORAL_TABLET | ORAL | Status: DC | PRN
  Administered 2012-09-11 – 2012-09-16 (×11): 5 mg via ORAL
  Filled 2012-09-11 (×12): qty 1

## 2012-09-11 MED ORDER — CALCIUM CARBONATE ANTACID 500 MG PO CHEW: 2.0000 | CHEWABLE_TABLET | Freq: Every day | ORAL | Status: DC | PRN

## 2012-09-11 MED ORDER — SODIUM CHLORIDE 0.9 % IV SOLN
INTRAVENOUS | Status: DC
  Administered 2012-09-11 – 2012-09-12 (×3): via INTRAVENOUS

## 2012-09-11 MED ORDER — ZOLPIDEM TARTRATE 5 MG PO TABS
5.0000 mg | ORAL_TABLET | Freq: Every evening | ORAL | Status: DC | PRN
  Administered 2012-09-11 – 2012-09-15 (×6): 5 mg via ORAL
  Filled 2012-09-11 (×6): qty 1

## 2012-09-11 MED ORDER — LISINOPRIL 40 MG PO TABS
40.0000 mg | ORAL_TABLET | Freq: Every day | ORAL | Status: DC
  Administered 2012-09-11 – 2012-09-16 (×6): 40 mg via ORAL
  Filled 2012-09-11 (×6): qty 1

## 2012-09-11 MED ORDER — VANCOMYCIN HCL IN DEXTROSE 1-5 GM/200ML-% IV SOLN
1000.0000 mg | Freq: Two times a day (BID) | INTRAVENOUS | Status: DC
  Administered 2012-09-11 – 2012-09-14 (×8): 1000 mg via INTRAVENOUS
  Filled 2012-09-11 (×10): qty 200

## 2012-09-11 MED ORDER — ATORVASTATIN CALCIUM 20 MG PO TABS
20.0000 mg | ORAL_TABLET | Freq: Every day | ORAL | Status: DC
  Administered 2012-09-11 – 2012-09-16 (×5): 20 mg via ORAL
  Filled 2012-09-11 (×6): qty 1

## 2012-09-11 MED ORDER — DOCUSATE SODIUM 100 MG PO CAPS
100.0000 mg | ORAL_CAPSULE | Freq: Two times a day (BID) | ORAL | Status: DC
  Administered 2012-09-11 (×2): 100 mg via ORAL
  Filled 2012-09-11 (×3): qty 1

## 2012-09-11 MED ORDER — PANTOPRAZOLE SODIUM 40 MG PO TBEC
40.0000 mg | DELAYED_RELEASE_TABLET | Freq: Every day | ORAL | Status: DC
Start: 2012-09-11 — End: 2012-09-16
  Administered 2012-09-11 – 2012-09-16 (×5): 40 mg via ORAL
  Filled 2012-09-11 (×5): qty 1

## 2012-09-11 MED ORDER — DEXTROSE 5 % IV SOLN
1.0000 g | Freq: Three times a day (TID) | INTRAVENOUS | Status: DC
  Administered 2012-09-11: 1 g via INTRAVENOUS
  Filled 2012-09-11 (×3): qty 1

## 2012-09-11 MED ORDER — INSULIN GLARGINE 100 UNIT/ML ~~LOC~~ SOLN
32.0000 [IU] | Freq: Every day | SUBCUTANEOUS | Status: DC
  Administered 2012-09-11: 32 [IU] via SUBCUTANEOUS
  Filled 2012-09-11 (×2): qty 0.32

## 2012-09-11 MED ORDER — PIPERACILLIN-TAZOBACTAM 3.375 G IVPB
3.3750 g | Freq: Three times a day (TID) | INTRAVENOUS | Status: DC
  Administered 2012-09-11 – 2012-09-15 (×12): 3.375 g via INTRAVENOUS
  Filled 2012-09-11 (×14): qty 50

## 2012-09-11 MED ORDER — FLUOXETINE HCL 10 MG PO CAPS
10.0000 mg | ORAL_CAPSULE | Freq: Every day | ORAL | Status: DC
  Administered 2012-09-11 – 2012-09-16 (×5): 10 mg via ORAL
  Filled 2012-09-11 (×6): qty 1

## 2012-09-11 MED ORDER — WARFARIN SODIUM 5 MG PO TABS
5.0000 mg | ORAL_TABLET | Freq: Once | ORAL | Status: AC
  Administered 2012-09-11: 5 mg via ORAL
  Filled 2012-09-11: qty 1

## 2012-09-11 MED ORDER — ACETAMINOPHEN 650 MG RE SUPP: 650.0000 mg | Freq: Four times a day (QID) | RECTAL | Status: DC | PRN

## 2012-09-11 MED ORDER — WHITE PETROLATUM GEL
Status: AC
  Filled 2012-09-11: qty 5

## 2012-09-11 MED ORDER — INSULIN ASPART 100 UNIT/ML ~~LOC~~ SOLN
0.0000 [IU] | Freq: Three times a day (TID) | SUBCUTANEOUS | Status: DC
  Administered 2012-09-11 (×2): 11 [IU] via SUBCUTANEOUS
  Administered 2012-09-11: 8 [IU] via SUBCUTANEOUS
  Administered 2012-09-12 (×3): 3 [IU] via SUBCUTANEOUS
  Administered 2012-09-14: 8 [IU] via SUBCUTANEOUS
  Administered 2012-09-14: 5 [IU] via SUBCUTANEOUS
  Administered 2012-09-14: 2 [IU] via SUBCUTANEOUS
  Administered 2012-09-15: 11 [IU] via SUBCUTANEOUS
  Administered 2012-09-15: 5 [IU] via SUBCUTANEOUS
  Administered 2012-09-15: 8 [IU] via SUBCUTANEOUS
  Administered 2012-09-16: 5 [IU] via SUBCUTANEOUS
  Administered 2012-09-16: 2 [IU] via SUBCUTANEOUS

## 2012-09-11 MED ORDER — VANCOMYCIN HCL 10 G IV SOLR
1500.0000 mg | Freq: Once | INTRAVENOUS | Status: AC
  Administered 2012-09-11: 1500 mg via INTRAVENOUS
  Filled 2012-09-11: qty 1500

## 2012-09-11 MED ORDER — ACETAMINOPHEN 325 MG PO TABS
650.0000 mg | ORAL_TABLET | Freq: Four times a day (QID) | ORAL | Status: DC | PRN
  Administered 2012-09-12: 650 mg via ORAL
  Filled 2012-09-11: qty 2

## 2012-09-11 NOTE — Consult Note (Signed)
Reason for Consult: Abscess right foot with sepsis Referring Physician: Dr. Harriet Butte is an 47 y.o. male.  HPI: Patient is a 47 year old gentleman with diabetes peripheral vascular disease he is status post a midfoot amputation the left and a first ray amputation the right. Patient has had progressive sepsis abscess on the right foot.  Past Medical History  Diagnosis Date  . Onychomycosis   . Hypertension   . Diabetic peripheral neuropathy   . Diabetic foot ulcer     s/p Right first ray amputation, left transmetatarsal amputation with revision  . History of drug abuse     Cocaine and marijuana  . Exposure to trichomonas     treated empirically  . Cellulitis of left foot 03/2008    left 4th and 5th metatarsal area  . Hyperlipidemia   . 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 Ulcerative esophagitis 2007    severe, complicated with UGI bleed  . Mallory Janann August tear April 2009  . History of prolonged Q-T interval on ECG   . 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  . 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 CKD (chronic kidney disease) stage 2, GFR 60-89 ml/min     BL SCr 1.3-1.4  . Depression   . Peripheral vascular disease   . Type 2 diabetes mellitus, uncontrolled, with renal complications   . GERD (gastroesophageal reflux disease)     Past Surgical History  Procedure Laterality Date  . Toe amputation  06/2011    left; 4th toe  . Nephrectomy      L side  . I&d extremity  08/24/2011    Procedure: IRRIGATION AND DEBRIDEMENT EXTREMITY;  Surgeon: Cammy Copa, MD;  Location: [facility] OR;  Service: Orthopedics;  Laterality: Left;  . Amputation  08/24/2011    Procedure: AMPUTATION RAY;  Surgeon: Cammy Copa, MD;  Location: [facility] OR;  Service: Orthopedics;  Laterality: Left;  left fourth toe Ray Resection  . Amputation  12/05/2011   Procedure: AMPUTATION RAY;  Surgeon: Cammy Copa, MD;  Location: [facility] OR;  Service: Orthopedics;  Laterality: Left;  . I&d extremity  02/18/2012    Procedure: IRRIGATION AND DEBRIDEMENT EXTREMITY;  Surgeon: Kathryne Hitch, MD;  Location: [facility] OR;  Service: Orthopedics;  Laterality: Left;  . Amputation  02/18/2012    Procedure: AMPUTATION DIGIT;  Surgeon: Kathryne Hitch, MD;  Location: [facility] OR;  Service: Orthopedics;  Laterality: Left;  revison transmetatarsal  . I&d extremity Left 08/28/2012    Procedure: IRRIGATION AND DEBRIDEMENT EXTREMITY;  Surgeon: Sharma Covert, MD;  Location: [facility] OR;  Service: Orthopedics;  Laterality: Left;  CYSTO TUBING/IRRIGATION, LEAD HAND.  . I&d extremity Left 08/30/2012    Procedure: IRRIGATION AND DEBRIDEMENT EXTREMITY;  Surgeon: Sharma Covert, MD;  Location: [facility] OR;  Service: Orthopedics;  Laterality: Left;  . Amputation Right 09/01/2012    Procedure: FOOT 1ST RAY AMPUTATION;  Surgeon: Nadara Mustard, MD;  Location: [facility] OR;  Service: Orthopedics;  Laterality: Right;  . Amputation Left 09/01/2012    Procedure: FOOT REVISION TRANSMETATARSAL AMPUTATION ;  Surgeon: Nadara Mustard, MD;  Location: [facility] OR;  Service: Orthopedics;  Laterality: Left;    Family History  Problem Relation Age of Onset  . Diabetes type II Mother   . Pancreatic cancer Father     Social History:  reports that  he has been smoking Cigarettes.  He has a 17 pack-year smoking history. He has never used smokeless tobacco. He reports that  drinks alcohol. He reports that he uses illicit drugs (Marijuana).  Allergies: No Known Allergies  Medications: I have reviewed the patient's current medications.  Results for orders placed during the hospital encounter of 09/10/12 (from the past 48 hour(s))  BASIC METABOLIC PANEL     Status: Abnormal   Collection Time    09/10/12  6:16 PM      Result Value Range   Sodium 129 (*) 135 - 145 mEq/L   Potassium 4.8  3.5 - 5.1 mEq/L   Chloride 95 (*)  96 - 112 mEq/L   CO2 24  19 - 32 mEq/L   Glucose, Bld 469 (*) 70 - 99 mg/dL   BUN 20  6 - 23 mg/dL   Creatinine, Ser 9.14 (*) 0.50 - 1.35 mg/dL   Calcium 9.0  8.4 - 78.2 mg/dL   GFR calc non Af Amer 56 (*) >90 mL/min   GFR calc Af Amer 64 (*) >90 mL/min   Comment:            The eGFR has been calculated     using the CKD EPI equation.     This calculation has not been     validated in all clinical     situations.     eGFR's persistently     <90 mL/min signify     possible Chronic Kidney Disease.  CBC     Status: Abnormal   Collection Time    09/10/12  6:16 PM      Result Value Range   WBC 22.3 (*) 4.0 - 10.5 K/uL   RBC 2.96 (*) 4.22 - 5.81 MIL/uL   Hemoglobin 8.4 (*) 13.0 - 17.0 g/dL   HCT 95.6 (*) 21.3 - 08.6 %   MCV 85.8  78.0 - 100.0 fL   MCH 28.4  26.0 - 34.0 pg   MCHC 33.1  30.0 - 36.0 g/dL   RDW 57.8  46.9 - 62.9 %   Platelets 715 (*) 150 - 400 K/uL  GLUCOSE, CAPILLARY     Status: Abnormal   Collection Time    09/10/12  6:22 PM      Result Value Range   Glucose-Capillary 424 (*) 70 - 99 mg/dL  PROTIME-INR     Status: Abnormal   Collection Time    09/10/12 10:45 PM      Result Value Range   Prothrombin Time 34.2 (*) 11.6 - 15.2 seconds   INR 3.65 (*) 0.00 - 1.49  GLUCOSE, CAPILLARY     Status: Abnormal   Collection Time    09/11/12  1:06 AM      Result Value Range   Glucose-Capillary 392 (*) 70 - 99 mg/dL   Comment 1 Notify RN    BASIC METABOLIC PANEL     Status: Abnormal   Collection Time    09/11/12  5:00 AM      Result Value Range   Sodium 130 (*) 135 - 145 mEq/L   Potassium 4.2  3.5 - 5.1 mEq/L   Chloride 99  96 - 112 mEq/L   CO2 21  19 - 32 mEq/L   Glucose, Bld 290 (*) 70 - 99 mg/dL   BUN 19  6 - 23 mg/dL   Creatinine, Ser 5.28  0.50 - 1.35 mg/dL   Calcium 8.7  8.4 - 41.3 mg/dL   GFR calc  non Af Amer 64 (*) >90 mL/min   GFR calc Af Amer 74 (*) >90 mL/min   Comment:            The eGFR has been calculated     using the CKD EPI equation.      This calculation has not been     validated in all clinical     situations.     eGFR's persistently     <90 mL/min signify     possible Chronic Kidney Disease.  CBC     Status: Abnormal   Collection Time    09/11/12  5:00 AM      Result Value Range   WBC 17.8 (*) 4.0 - 10.5 K/uL   RBC 2.79 (*) 4.22 - 5.81 MIL/uL   Hemoglobin 7.8 (*) 13.0 - 17.0 g/dL   HCT 16.1 (*) 09.6 - 04.5 %   MCV 85.3  78.0 - 100.0 fL   MCH 28.0  26.0 - 34.0 pg   MCHC 32.8  30.0 - 36.0 g/dL   RDW 40.9  81.1 - 91.4 %   Platelets 649 (*) 150 - 400 K/uL   Comment: PLATELET COUNT CONFIRMED BY SMEAR  PROTIME-INR     Status: Abnormal   Collection Time    09/11/12  6:39 AM      Result Value Range   Prothrombin Time 30.3 (*) 11.6 - 15.2 seconds   INR 3.10 (*) 0.00 - 1.49  GLUCOSE, CAPILLARY     Status: Abnormal   Collection Time    09/11/12  8:21 AM      Result Value Range   Glucose-Capillary 275 (*) 70 - 99 mg/dL    Dg Foot Complete Right  09/10/2012   *RADIOLOGY REPORT*  Clinical Data: Worsening diabetic foot wound. Surgery on 08/31/2012.  RIGHT FOOT COMPLETE - 3+ VIEW  Comparison: 08/30/2012.  Findings: When compared to the prior examination there has been interval resection of the remaining first proximal phalanx and the entire first metatarsal.  Overlying soft tissues are irregular, and there is extensive gas within the soft tissues at the site of resection.  No definite destructive bony lesions are identified at this time.  No acute displaced fracture, subluxation or dislocation is noted.  IMPRESSION: 1.  Postoperative changes related to recent first ray amputation. Extensive gas in the soft tissues in the resection bed may simply be postoperative, however, clinical correlation for signs and symptoms of soft tissue infection is strongly recommended.   Original Report Authenticated By: Trudie Reed, M.D.    Review of Systems  All other systems reviewed and are negative.   Blood pressure 141/83, pulse 99,  temperature 99 F (37.2 C), temperature source Oral, resp. rate 20, height 5' 8.11" (1.73 m), weight 86.32 kg (190 lb 4.8 oz), SpO2 100.00%. Physical Exam On examination there is purulent drainage from the right foot incision. Assessment/Plan: Assessment: Abscess right foot with stable healing of the left midfoot amputation.  Plan: Do not feel the patient has any options for foot salvage surgery at this time. We will plan for a transtibial amputation on the right. Surgery most likely on Friday. I will set this up for the patient. Risks and benefits of surgery were discussed patient states he understands and wishes to proceed at this time.  Shion Bluestein V 09/11/2012, 11:29 AM

## 2012-09-11 NOTE — Progress Notes (Signed)
ANTIBIOTIC CONSULT NOTE - INITIAL  Pharmacy Consult for Vancomycin And Cefepime Indication: Diabetic foot wound  No Known Allergies  Patient Measurements: Height: 5' 8.11" (173 cm) Weight: 185 lb 3 oz (84 kg) IBW/kg (Calculated) : 68.65  Vital Signs: Temp: 101.4 F (38.6 C) (06/03 2032) Temp src: Oral (06/03 2032) BP: 141/86 mmHg (06/03 2357) Pulse Rate: 109 (06/03 2357)  Labs:  Recent Labs  09/10/12 1816  WBC 22.3*  HGB 8.4*  PLT 715*  CREATININE 1.46*   Estimated Creatinine Clearance: 66.2 ml/min (by C-G formula based on Cr of 1.46). No results found for this basename: VANCOTROUGH, Leodis Binet, VANCORANDOM, GENTTROUGH, GENTPEAK, GENTRANDOM, TOBRATROUGH, TOBRAPEAK, TOBRARND, AMIKACINPEAK, AMIKACINTROU, AMIKACIN,  in the last 72 hours   Microbiology: Recent Results (from the past 720 hour(s))  CULTURE, BLOOD (ROUTINE X 2)     Status: None   Collection Time    08/28/12  8:45 PM      Result Value Range Status   Specimen Description BLOOD ARM RIGHT   Final   Special Requests BOTTLES DRAWN AEROBIC AND ANAEROBIC 10CC   Final   Culture  Setup Time 08/29/2012 05:08   Final   Culture NO GROWTH 5 DAYS   Final   Report Status 09/04/2012 FINAL   Final  CULTURE, BLOOD (ROUTINE X 2)     Status: None   Collection Time    08/28/12  8:55 PM      Result Value Range Status   Specimen Description BLOOD ARM LEFT   Final   Special Requests BOTTLES DRAWN AEROBIC AND ANAEROBIC 10CC   Final   Culture  Setup Time 08/29/2012 05:08   Final   Culture NO GROWTH 5 DAYS   Final   Report Status 09/04/2012 FINAL   Final  ANAEROBIC CULTURE     Status: None   Collection Time    08/28/12  9:50 PM      Result Value Range Status   Specimen Description ABSCESS HAND LEFT   Final   Special Requests PATIENT ON FOLLOWING CLEOCIN   Final   Gram Stain     Final   Value: FEW WBC PRESENT,BOTH PMN AND MONONUCLEAR     NO SQUAMOUS EPITHELIAL CELLS SEEN     FEW GRAM POSITIVE COCCI     IN PAIRS   Culture NO  ANAEROBES ISOLATED   Final   Report Status 09/02/2012 FINAL   Final  CULTURE, ROUTINE-ABSCESS     Status: None   Collection Time    08/28/12  9:50 PM      Result Value Range Status   Specimen Description ABSCESS HAND LEFT   Final   Special Requests PATIENT ON FOLLOWING CLEOCIN   Final   Gram Stain     Final   Value: FEW WBC PRESENT,BOTH PMN AND MONONUCLEAR     NO SQUAMOUS EPITHELIAL CELLS SEEN     FEW GRAM POSITIVE COCCI     IN PAIRS   Culture     Final   Value: MODERATE GROUP B STREP(S.AGALACTIAE)ISOLATED     Note: TESTING AGAINST S. AGALACTIAE NOT ROUTINELY PERFORMED DUE TO PREDICTABILITY OF AMP/PEN/VAN SUSCEPTIBILITY.   Report Status 08/31/2012 FINAL   Final  SURGICAL PCR SCREEN     Status: None   Collection Time    08/30/12 10:35 AM      Result Value Range Status   MRSA, PCR NEGATIVE  NEGATIVE Final   Staphylococcus aureus NEGATIVE  NEGATIVE Final   Comment:  The Xpert SA Assay (FDA     approved for NASAL specimens     in patients over 33 years of age),     is one component of     a comprehensive surveillance     program.  Test performance has     been validated by [employer] for patients greater     than or equal to 19 year old.     It is not intended     to diagnose infection nor to     guide or monitor treatment.   Assessment: 47 yo male with probable osteomyelitis for empiric antibiotics  Goal of Therapy:  Vancomycin trough level 15-20 mcg/ml  Plan:  Vancomycin 1500 mg IV now, then 1 g IV q12h Cefepime 1 g IV q8h  Eddie Candle 09/11/2012,12:33 AM

## 2012-09-11 NOTE — H&P (Signed)
Internal Medicine Attending Admission Note Date: 09/11/2012  Patient name: [name] Medical record number: [id number] Date of birth: 07/31/1965 Age: 47 y.o. Gender: male  I saw and evaluated the patient. I reviewed the resident's note and I agree with the resident's findings and plan as documented in the resident's note.  Chief Complaint(s): [name] is a 47 year old man with past medical history of uncontrolled diabetes with multiple complications, CKG stage III, recent admission status post amputation, polysubstance abuse, hypertension and depression who was admitted again after experiencing high-grade fevers, increasing pain in his right leg which was amputated one week ago and also increased drainage from the wound. Pain was described as severe, constant, 8/10, worse with walking or any activity and better with rest. Patient also had 2 episodes of nonbloody emesis on the day of admission. Fever as high as 103.9 associated with chills, fatigue noted. Patient has had poor appetite and also feels dizzy ever since discharge from the hospital last week. Patient was admitted for evaluation and treatment of what seems like infected wound. Initial evaluation in the ER was suggestive of elevated white count and tachycardia.  15 point review of system was negative except what is noted above.  Past medical history, past surgical history, medications, family history and social history was reviewed and is as per resident's note.  History - key components related to admission:  Physical Exam - key components related to admission:  Filed Vitals:   09/11/12 0058 09/11/12 0100 09/11/12 0621 09/11/12 0622  BP: 159/80 151/80 138/79 141/83  Pulse: 96 74 99   Temp: 99.9 F (37.7 C)  99 F (37.2 C)   TempSrc: Oral  Oral   Resp: 17  20   Height:      Weight:      SpO2: 100%  100%   Physical Exam: General: Vital signs reviewed and noted. Well-developed, well-nourished, in no acute distress; alert,  appropriate and cooperative throughout examination.  Head: Normocephalic, atraumatic.  Eyes: PERRL, EOMI, No signs of anemia or jaundince.  Nose: Mucous membranes moist, not inflammed, nonerythematous.  Throat: Oropharynx nonerythematous, no exudate appreciated.   Neck: No deformities, masses, or tenderness noted.Supple, No carotid Bruits, no JVD.  Lungs:  Normal respiratory effort. Clear to auscultation BL without crackles or wheezes.  Heart: RRR. S1 and S2 normal without gallop, murmur, or rubs.  Abdomen:  BS normoactive. Soft, Nondistended, non-tender.  No masses or organomegaly.  Extremities:  patient has dehiscence of the right foot, surgical wound with active drainage of pus, pulses are 2+ bilaterally. Left lower extremity s/p transmetarsal amputation and surgical debridement, surgical wound looks clean without any drainage   Neurologic: A&O X3, CN II - XII are grossly intact. Motor strength is 5/5 in the all 4 extremities, Sensations intact to light touch, Cerebellar signs negative.  Skin: No visible rashes, scars.     Lab results:   Basic Metabolic Panel:  Recent Labs  56/21/30 1816 09/11/12 0500  NA 129* 130*  K 4.8 4.2  CL 95* 99  CO2 24 21  GLUCOSE 469* 290*  BUN 20 19  CREATININE 1.46* 1.30  CALCIUM 9.0 8.7   CBC:  Recent Labs  09/10/12 1816 09/11/12 0500  WBC 22.3* 17.8*  HGB 8.4* 7.8*  HCT 25.4* 23.8*  MCV 85.8 85.3  PLT 715* 649*   CBG:  Recent Labs  09/10/12 1822 09/11/12 0106 09/11/12 0821  GLUCAP 424* 392* 275*   Coagulation:  Recent Labs  09/10/12 2245 09/11/12 8657  INR 3.65* 3.10*     Imaging results:  Dg Foot Complete Right  09/10/2012   *RADIOLOGY REPORT*  Clinical Data: Worsening diabetic foot wound. Surgery on 08/31/2012.  RIGHT FOOT COMPLETE - 3+ VIEW  Comparison: 08/30/2012.  Findings: When compared to the prior examination there has been interval resection of the remaining first proximal phalanx and the entire first  metatarsal.  Overlying soft tissues are irregular, and there is extensive gas within the soft tissues at the site of resection.  No definite destructive bony lesions are identified at this time.  No acute displaced fracture, subluxation or dislocation is noted.  IMPRESSION: 1.  Postoperative changes related to recent first ray amputation. Extensive gas in the soft tissues in the resection bed may simply be postoperative, however, clinical correlation for signs and symptoms of soft tissue infection is strongly recommended.   Original Report Authenticated By: Trudie Reed, M.D.    Other results: EKG: Not done  Assessment & Plan by Problem:  Principal Problem:   Sepsis due to right foot ulcer Active Problems:   Diabetes Type 2, Uncontroled   DIABETIC PERIPHERAL NEUROPATHY   TOBACCO ABUSE   HYPERTENSION   CKD Stage 2   Diabetic foot ulcer   GERD (gastroesophageal reflux disease)   Hyperlipedemia   Patient is a 47 year old man with past medical history as noted above was admitted 2 weeks ago to our service for non-healing foot ulcers bilaterally status post amputation of the right first toe and debridement of left foot ulcer comes back again with infected surgical site on the right foot.  -Patient meets the criteria for sepsis at this time and was started on broad-spectrum antibiotics.  -Given that patient has poorly controlled diabetes, vancomycin and Zosyn for polymicrobial coverage as recommended at this time.  -We will involve infectious disease for their help although I believe that controlling elevated blood sugars as extremely critical to have any chance of getting the wound healed up at this time. -Dr Lajoyce Corners to re-evaluate the wound today. Appreciate his help with this difficult case. -may consider IV insulin with q1h CBG for tight CBG control (100-150) if CBG's not controlled with sliding scale.  Rest of the medical management as per resident's note.  Wesley Mage  MD Faculty-Internal Medicine Residency Program

## 2012-09-11 NOTE — Progress Notes (Signed)
ANTIBIOTIC and Anticoagulation CONSULT NOTE - follow up Pharmacy Consult for Vancomycin And zosyn and coumadin Indication: R foot ulcer, s/p amputation; VTE px s/p ray amputation 09/01/12  No Known Allergies  Patient Measurements: Height: 5' 8.11" (173 cm) Weight: 185 lb 3 oz (84 kg) IBW/kg (Calculated) : 68.65  Vital Signs: Temp: 99 F (37.2 C) (06/04 0621) Temp src: Oral (06/04 0621) BP: 141/83 mmHg (06/04 0622) Pulse Rate: 99 (06/04 0621)  Labs:  Recent Labs  09/10/12 1816 09/11/12 0500  WBC 22.3* 17.8*  HGB 8.4* 7.8*  PLT 715* 649*  CREATININE 1.46* 1.30   Estimated Creatinine Clearance: 74.3 ml/min (by C-G formula based on Cr of 1.3). No results found for this basename: VANCOTROUGH, Leodis Binet, VANCORANDOM, GENTTROUGH, GENTPEAK, GENTRANDOM, TOBRATROUGH, TOBRAPEAK, TOBRARND, AMIKACINPEAK, AMIKACINTROU, AMIKACIN,  in the last 72 hours   Microbiology: Recent Results (from the past 720 hour(s))  CULTURE, BLOOD (ROUTINE X 2)     Status: None   Collection Time    08/28/12  8:45 PM      Result Value Range Status   Specimen Description BLOOD ARM RIGHT   Final   Special Requests BOTTLES DRAWN AEROBIC AND ANAEROBIC 10CC   Final   Culture  Setup Time 08/29/2012 05:08   Final   Culture NO GROWTH 5 DAYS   Final   Report Status 09/04/2012 FINAL   Final  CULTURE, BLOOD (ROUTINE X 2)     Status: None   Collection Time    08/28/12  8:55 PM      Result Value Range Status   Specimen Description BLOOD ARM LEFT   Final   Special Requests BOTTLES DRAWN AEROBIC AND ANAEROBIC 10CC   Final   Culture  Setup Time 08/29/2012 05:08   Final   Culture NO GROWTH 5 DAYS   Final   Report Status 09/04/2012 FINAL   Final  ANAEROBIC CULTURE     Status: None   Collection Time    08/28/12  9:50 PM      Result Value Range Status   Specimen Description ABSCESS HAND LEFT   Final   Special Requests PATIENT ON FOLLOWING CLEOCIN   Final   Gram Stain     Final   Value: FEW WBC PRESENT,BOTH PMN AND  MONONUCLEAR     NO SQUAMOUS EPITHELIAL CELLS SEEN     FEW GRAM POSITIVE COCCI     IN PAIRS   Culture NO ANAEROBES ISOLATED   Final   Report Status 09/02/2012 FINAL   Final  CULTURE, ROUTINE-ABSCESS     Status: None   Collection Time    08/28/12  9:50 PM      Result Value Range Status   Specimen Description ABSCESS HAND LEFT   Final   Special Requests PATIENT ON FOLLOWING CLEOCIN   Final   Gram Stain     Final   Value: FEW WBC PRESENT,BOTH PMN AND MONONUCLEAR     NO SQUAMOUS EPITHELIAL CELLS SEEN     FEW GRAM POSITIVE COCCI     IN PAIRS   Culture     Final   Value: MODERATE GROUP B STREP(S.AGALACTIAE)ISOLATED     Note: TESTING AGAINST S. AGALACTIAE NOT ROUTINELY PERFORMED DUE TO PREDICTABILITY OF AMP/PEN/VAN SUSCEPTIBILITY.   Report Status 08/31/2012 FINAL   Final  SURGICAL PCR SCREEN     Status: None   Collection Time    08/30/12 10:35 AM      Result Value Range Status   MRSA, PCR NEGATIVE  NEGATIVE  Final   Staphylococcus aureus NEGATIVE  NEGATIVE Final   Comment:            The Xpert SA Assay (FDA     approved for NASAL specimens     in patients over 6 years of age),     is one component of     a comprehensive surveillance     program.  Test performance has     been validated by [employer] for patients greater     than or equal to 15 year old.     It is not intended     to diagnose infection nor to     guide or monitor treatment.   Assessment: 47 yo male with sepsis due to R foot ulcer vs osteomyelitis.   Admission temp 101.4, last temp 99.  WBC 22.3 on admit, 17.8 today.  Creat down to 1.3 from 1.46 on admit.  Xray of foot with extensive gas in soft tissues.  He got loaded with vanc 1500 mg 6/4 at 0124 and he rec'd cefepime 1 gm at 0123 am 6/4.   Now cefepime to be changed to zosyn.  6/3 BC x 2 IP 6/3 foot/wound cx IP vanc 6/4 >> Cefepime 6/4 x 1 dpse Zosyn 6/4 >>  s/p ray amputation on 5/25 and placed on coumadin for dvt prophylaxis.  His home coumadin dose  is 5 mg daily with last dose PTA 6/3.  His admission INR was 3.65 and today his INR is 3.10.  His H/H is 7.8/28.8.  His PLTC is high.  No bleeding reported.  His INR is on the high side of the desired goal.    Goal of Therapy:  Vancomycin trough level 15-20 mcg/ml INR 2-3  Plan:  Coumadin 5 mg po x 1 dose today Daily INR Vancomycin 1500 mg IV given 6/4 at 0124 am then 1 g IV q12h Zosyn 3.375 gm IV q8hrs, infuse each dose over 4 hours F/u renal function, culture data, fever curve. Herby Abraham, Pharm.D. [phone] 09/11/2012 9:52 AM

## 2012-09-11 NOTE — Progress Notes (Signed)
6/4  CBGs 424-392-275 mg/dl. Recommend increasing Lantus to 36 units daily that would be equal to the 70/30 insulin that he was taking previously.  May need to titrate up as needed.  Will continue to follow while in hospital.  Smith Mince RN BSN CDE

## 2012-09-11 NOTE — Progress Notes (Signed)
UR completed 

## 2012-09-12 DIAGNOSIS — E1159 Type 2 diabetes mellitus with other circulatory complications: Secondary | ICD-10-CM

## 2012-09-12 LAB — CBC
Hemoglobin: 7.4 g/dL — ABNORMAL LOW (ref 13.0–17.0)
MCV: 86.6 fL (ref 78.0–100.0)
Platelets: 601 10*3/uL — ABNORMAL HIGH (ref 150–400)
RBC: 2.62 MIL/uL — ABNORMAL LOW (ref 4.22–5.81)
WBC: 18 10*3/uL — ABNORMAL HIGH (ref 4.0–10.5)

## 2012-09-12 LAB — GLUCOSE, CAPILLARY
Glucose-Capillary: 147 mg/dL — ABNORMAL HIGH (ref 70–99)
Glucose-Capillary: 187 mg/dL — ABNORMAL HIGH (ref 70–99)
Glucose-Capillary: 195 mg/dL — ABNORMAL HIGH (ref 70–99)

## 2012-09-12 LAB — BASIC METABOLIC PANEL
CO2: 24 mEq/L (ref 19–32)
Chloride: 97 mEq/L (ref 96–112)
Creatinine, Ser: 1.37 mg/dL — ABNORMAL HIGH (ref 0.50–1.35)
Glucose, Bld: 165 mg/dL — ABNORMAL HIGH (ref 70–99)
Sodium: 130 mEq/L — ABNORMAL LOW (ref 135–145)

## 2012-09-12 MED ORDER — ONDANSETRON HCL 4 MG/2ML IJ SOLN
4.0000 mg | Freq: Once | INTRAMUSCULAR | Status: AC
  Administered 2012-09-12: 4 mg via INTRAVENOUS
  Filled 2012-09-12: qty 2

## 2012-09-12 MED ORDER — SENNOSIDES-DOCUSATE SODIUM 8.6-50 MG PO TABS
2.0000 | ORAL_TABLET | Freq: Every evening | ORAL | Status: DC | PRN
  Administered 2012-09-15: 2 via ORAL
  Filled 2012-09-12 (×2): qty 1

## 2012-09-12 NOTE — Progress Notes (Signed)
ANTICOAGULATION CONSULT NOTE - Follow Up Consult  Pharmacy Consult:  Coumadin Indication:   VTE prophylaxis  No Known Allergies  Patient Measurements: Height: 5' 8.11" (173 cm) Weight: 190 lb 4.8 oz (86.32 kg) IBW/kg (Calculated) : 68.65  Vital Signs: Temp: 99.7 F (37.6 C) (06/05 0535) Temp src: Oral (06/05 0535) BP: 131/69 mmHg (06/05 0535) Pulse Rate: 104 (06/05 0535)  Labs:  Recent Labs  09/10/12 1816 09/10/12 2245 09/11/12 0500 09/11/12 0639 09/12/12 0505  HGB 8.4*  --  7.8*  --   --   HCT 25.4*  --  23.8*  --   --   PLT 715*  --  649*  --   --   LABPROT  --  34.2*  --  30.3* 28.8*  INR  --  3.65*  --  3.10* 2.90*  CREATININE 1.46*  --  1.30  --   --     Estimated Creatinine Clearance: 75.2 ml/min (by C-G formula based on Cr of 1.3).        Assessment: 85  YOM with chronic osteomyelitis s/p ray amputation in right foot on 09/01/12, admitted with foul odor coming from wound.  Pharmacy managing antibiotics as well as Coumadin for VTE prophylaxis.  INR currently at 2.9 and may trend up since patient received Coumadin 5mg  PO on 09/11/12.  Patient to undergo amputation tomorrow.   Goal of Therapy:  INR 2-3 Monitor platelets by anticoagulation protocol: Yes Vanc trough 15 - 20 mcg/mL    Plan:  - Hold Coumadin for amputation per discussion with Dr. Burtis Junes, f/u with order to resume post surgery - INR reversal per MD - Daily PT / INR - Vanc 1gm IV Q12H - Zosyn 3.375gm IV Q8H, 4 hr infusion - Monitor renal fxn, micro data, vanc trough as indicated - F/U CBGs and insulin adjustments     Arvo Ealy D. Laney Potash, PharmD, BCPS Pager:  [phone] 09/12/2012, 9:41 AM

## 2012-09-12 NOTE — Progress Notes (Signed)
Internal Medicine Teaching Service Attending Note Date: 09/12/2012  Patient name: [name]  Medical record number: [id number]  Date of birth: 25-Oct-1965    This patient has been seen and discussed with the house staff. Please see their note for complete details. I concur with their findings with the following additions/corrections: Patient with elevated CBG's and elevated INR today. Pain is well controlled. Patient will undergo transtibial amputation tomorrow as per Dr. Audrie Lia Note. Need to control CBG's to improve chances of getting the wound healed and decrease chances of post amputation wound infection. Patient will also require FFP's prior to operation tomorrow as his INR is still >2. We will order 2 units FFP today to be transfused overnight.   Lars Mage 09/12/2012, 10:49 AM

## 2012-09-12 NOTE — Progress Notes (Signed)
Subjective: Pt doing well this AM. No acute events overnight. Tmax 100.7 and improving on IV ABx. RBKA scheduled 6/6 and pt accepting diagnosis. Pt extensive conversation on coping with diagnosis and fear of support from wife when will return home. Having some constipation this AM.   Objective: Vital signs in last 24 hours: Filed Vitals:   09/11/12 1017 09/11/12 1405 09/11/12 2209 09/12/12 0535  BP:  135/74 157/86 131/69  Pulse:  103 114 104  Temp:  99.1 F (37.3 C) 100.7 F (38.2 C) 99.7 F (37.6 C)  TempSrc:  Oral Oral Oral  Resp:  18 19 16   Height:      Weight: 190 lb 4.8 oz (86.32 kg)   190 lb 4.8 oz (86.32 kg)  SpO2:  100% 98% 100%   Weight change: 5 lb 1.8 oz (2.32 kg)  Intake/Output Summary (Last 24 hours) at 09/12/12 0723 Last data filed at 09/12/12 0538  Gross per 24 hour  Intake   3508 ml  Output   2100 ml  Net   1408 ml   General: resting in bed, NAD HEENT: PERRL, EOMI, no scleral icterus Cardiac: RRR, no rubs, murmurs or gallops Pulm: clear to auscultation bilaterally, moving normal volumes of air Abd: soft, nontender, nondistended, BS present Ext: warm and well perfused, no pedal edema, rt LE very rancid odor and active drainage of frank pus w/o bleeding slightly ttp, lft LE wound c/d/i no active drainage/erythema/tenderness Neuro: alert and oriented X3, cranial nerves II-XII grossly intact  Lab Results: Basic Metabolic Panel:  Recent Labs Lab 09/10/12 1816 09/11/12 0500  NA 129* 130*  K 4.8 4.2  CL 95* 99  CO2 24 21  GLUCOSE 469* 290*  BUN 20 19  CREATININE 1.46* 1.30  CALCIUM 9.0 8.7   CBC:  Recent Labs Lab 09/10/12 1816 09/11/12 0500  WBC 22.3* 17.8*  HGB 8.4* 7.8*  HCT 25.4* 23.8*  MCV 85.8 85.3  PLT 715* 649*   CBG:  Recent Labs Lab 09/10/12 1822 09/11/12 0106 09/11/12 0821 09/11/12 1222 09/11/12 1604 09/11/12 2202  GLUCAP 424* 392* 275* 323* 314* 233*   Coagulation:  Recent Labs Lab 09/10/12 2245 09/11/12 0639  09/12/12 0505  LABPROT 34.2* 30.3* 28.8*  INR 3.65* 3.10* 2.90*   Urine Drug Screen: Drugs of Abuse     Component Value Date/Time   LABOPIA NONE DETECTED 07/27/2012 0153   COCAINSCRNUR NONE DETECTED 07/27/2012 0153   COCAINSCRNUR NEG 09/22/2008 2125   LABBENZ NONE DETECTED 07/27/2012 0153   LABBENZ NEG 09/22/2008 2125   AMPHETMU NONE DETECTED 07/27/2012 0153   AMPHETMU NEG 09/22/2008 2125   THCU POSITIVE* 07/27/2012 0153   LABBARB NONE DETECTED 07/27/2012 0153     Micro Results: Recent Results (from the past 240 hour(s))  WOUND CULTURE     Status: None   Collection Time    09/10/12 10:36 PM      Result Value Range Status   Specimen Description WOUND LEFT FOOT   Final   Special Requests NONE   Final   Gram Stain     Final   Value: NO WBC SEEN     NO SQUAMOUS EPITHELIAL CELLS SEEN     ABUNDANT GRAM VARIABLE ROD   Culture PENDING   Incomplete   Report Status PENDING   Incomplete  SURGICAL PCR SCREEN     Status: None   Collection Time    09/11/12  8:03 PM      Result Value Range Status   MRSA,  PCR NEGATIVE  NEGATIVE Final   Staphylococcus aureus NEGATIVE  NEGATIVE Final   Comment:            The Xpert SA Assay (FDA     approved for NASAL specimens     in patients over 42 years of age),     is one component of     a comprehensive surveillance     program.  Test performance has     been validated by [employer] for patients greater     than or equal to 3 year old.     It is not intended     to diagnose infection nor to     guide or monitor treatment.   Studies/Results: Dg Foot Complete Right  09/10/2012   *RADIOLOGY REPORT*  Clinical Data: Worsening diabetic foot wound. Surgery on 08/31/2012.  RIGHT FOOT COMPLETE - 3+ VIEW  Comparison: 08/30/2012.  Findings: When compared to the prior examination there has been interval resection of the remaining first proximal phalanx and the entire first metatarsal.  Overlying soft tissues are irregular, and there is extensive gas  within the soft tissues at the site of resection.  No definite destructive bony lesions are identified at this time.  No acute displaced fracture, subluxation or dislocation is noted.  IMPRESSION: 1.  Postoperative changes related to recent first ray amputation. Extensive gas in the soft tissues in the resection bed may simply be postoperative, however, clinical correlation for signs and symptoms of soft tissue infection is strongly recommended.   Original Report Authenticated By: Trudie Reed, M.D.   Medications: I have reviewed the patient's current medications. Scheduled Meds: . atorvastatin  20 mg Oral Daily  . docusate sodium  100 mg Oral BID  . FLUoxetine  10 mg Oral Daily  . gabapentin  300 mg Oral TID  . insulin aspart  0-15 Units Subcutaneous TID WC  . insulin glargine  36 Units Subcutaneous QHS  . lisinopril  40 mg Oral Daily  . pantoprazole  40 mg Oral Daily  . piperacillin-tazobactam (ZOSYN)  IV  3.375 g Intravenous Q8H  . senna  1 tablet Oral BID  . vancomycin  1,000 mg Intravenous Q12H  . Warfarin - Pharmacist Dosing Inpatient   Does not apply q1800   Continuous Infusions: . sodium chloride 125 mL/hr at 09/12/12 0538   PRN Meds:.acetaminophen, acetaminophen, calcium carbonate, oxyCODONE, zolpidem Assessment/Plan: #Sepsis secondary to right foot ulcers: Initial Temp=101.4, Pulse 116. Leukocytosis of 22. 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 Lajoyce Corners. Repeat rt foot xray 6/3 some extensive gas pattern and soft tissue infection. Has been compliant with clindamycin. Pt continued to have fevers on 09/11/12 and then broader coverage of Abx was made and Dr. Lajoyce Corners planned amputation RBKA on 6/6. Wound Cx have been NTD with multiple G-rods on microscopy. BCx NTD.   - consulted orthopedic Dr. Luther Bradley who plans r bka on 6/6  - Warfarin  as recommended by surgery  (INR currently 2.9) may need FFP on 6/6 before surgery - cont Vanc/zosyn - Acetominophen prn for fever  - Oxycodone IR.  - IV normal saline at 100 cc per hour. 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 Home Lantus dose was recently increased from 25 u to 32 units. Despite his  good compliance with insulin, CBGs above 400 at home. On admission was 469. Urine albumin-to-creatinine ratio elevated at 100.2 on 06/21/2012. Patient on lisinopril 40 mg daily. Patient also takes gabapentin 300 mg 3 times a day for diabetic neuropathy.   -Lantus 36 units at bedtime.  -Moderate sliding scale insulin.  -CBGs every 4 hours when pt is n.p.o.  -DM educator -Gabapentin and lisinopril  -npo mdn  # Hypertension: At home he takes lisinopril 40 mg daily. Currently well controlled.  -cont to monitor  -Lisinopril 40 mg daily.   # Constipation: He reports that he has not had a BM since he was discharged on 5/27 likely in setting of opiate use s/p recent orthopedic surgery. - senna-S  -prn tap water enema  # Chronic Kidney disease stage 2: Baseline creatinine around 1.3. Slightly increased to 1.46 with BUN increased from 12 to 20 over the last 1 week. Likely cause is prerenal in the acute setting of sepsis with a reduced oral intake and fevers.  -cont to monitor -check bmet   # Depression: Stable. At home he takes fluoxetine 10 mg daily. Consult to spiritual care.   # Hyperlipidemia: LDL 06/21/12 129. Will continue with Lipitor 40mg  daily.   Dispo: Disposition is deferred at this time, awaiting improvement of current medical problems.  Anticipated discharge in approximately 3-4 day(s).   The patient does have a current PCP Zada Girt, Richard, MD), therefore will be require OPC follow-up after discharge.   The patient does not have transportation limitations that hinder transportation to clinic  appointments.  .Services Needed at time of discharge: Y = Yes, Blank = No PT:   OT:   RN:   Equipment:   Other:     LOS: 2 days   Christen Bame, MD 09/12/2012, 7:23 AM Pgr: [phone]

## 2012-09-12 NOTE — Progress Notes (Signed)
Patient ID: [name], male   DOB: 06/12/1965, 47 y.o.   MRN: [id number] Plan for right transtibial amputation Friday afternoon. N.p.o. after midnight tonight. 

## 2012-09-13 ENCOUNTER — Encounter (HOSPITAL_COMMUNITY)
Admission: EM | Disposition: A | Discharge status: Home / Self Care | Payer: Self-pay | Source: Home / Self Care | Attending: Internal Medicine

## 2012-09-13 ENCOUNTER — Encounter (HOSPITAL_COMMUNITY): Payer: Self-pay | Admitting: Anesthesiology

## 2012-09-13 ENCOUNTER — Inpatient Hospital Stay (HOSPITAL_COMMUNITY): Payer: Medicaid Other | Admitting: Anesthesiology

## 2012-09-13 ENCOUNTER — Encounter (HOSPITAL_COMMUNITY): Payer: Self-pay | Admitting: Certified Registered Nurse Anesthetist

## 2012-09-13 HISTORY — PX: AMPUTATION: SHX166

## 2012-09-13 LAB — WOUND CULTURE: Gram Stain: NONE SEEN

## 2012-09-13 LAB — CBC
Hemoglobin: 7.9 g/dL — ABNORMAL LOW (ref 13.0–17.0)
MCH: 28.1 pg (ref 26.0–34.0)
MCHC: 32.6 g/dL (ref 30.0–36.0)
MCV: 87.5 fL (ref 78.0–100.0)
Platelets: 607 10*3/uL — ABNORMAL HIGH (ref 150–400)
RBC: 2.81 MIL/uL — ABNORMAL LOW (ref 4.22–5.81)
RDW: 13.7 % (ref 11.5–15.5)
WBC: 15.1 10*3/uL — ABNORMAL HIGH (ref 4.0–10.5)

## 2012-09-13 LAB — GLUCOSE, CAPILLARY
Glucose-Capillary: 91 mg/dL (ref 70–99)
Glucose-Capillary: 114 mg/dL — ABNORMAL HIGH (ref 70–99)
Glucose-Capillary: 281 mg/dL — ABNORMAL HIGH (ref 70–99)

## 2012-09-13 LAB — PROTIME-INR
INR: 2.5 — ABNORMAL HIGH (ref 0.00–1.49)
Prothrombin Time: 25.8 seconds — ABNORMAL HIGH (ref 11.6–15.2)

## 2012-09-13 LAB — BASIC METABOLIC PANEL
BUN: 14 mg/dL (ref 6–23)
CO2: 25 mEq/L (ref 19–32)
Calcium: 8.5 mg/dL (ref 8.4–10.5)
Chloride: 98 mEq/L (ref 96–112)
Creatinine, Ser: 1.43 mg/dL — ABNORMAL HIGH (ref 0.50–1.35)
GFR calc Af Amer: 66 mL/min — ABNORMAL LOW (ref >90)
GFR calc non Af Amer: 57 mL/min — ABNORMAL LOW (ref >90)
GFR calc non Af Amer: 65 mL/min — ABNORMAL LOW (ref >90)
Glucose, Bld: 123 mg/dL — ABNORMAL HIGH (ref 70–99)
Potassium: 4 mEq/L (ref 3.5–5.1)
Sodium: 134 mEq/L — ABNORMAL LOW (ref 135–145)

## 2012-09-13 LAB — TYPE AND SCREEN

## 2012-09-13 SURGERY — AMPUTATION BELOW KNEE
Anesthesia: General | Site: Leg Lower | Laterality: Right | Wound class: Clean

## 2012-09-13 MED ORDER — MIDAZOLAM HCL 2 MG/2ML IJ SOLN: 1.0000 mg | INTRAMUSCULAR | Status: DC | PRN

## 2012-09-13 MED ORDER — OXYCODONE HCL 5 MG/5ML PO SOLN: 5.0000 mg | Freq: Once | ORAL | Status: DC | PRN

## 2012-09-13 MED ORDER — OXYCODONE HCL 5 MG PO TABS: 5.0000 mg | ORAL_TABLET | Freq: Once | ORAL | Status: DC | PRN

## 2012-09-13 MED ORDER — DIAZEPAM 5 MG/ML IJ SOLN
5.0000 mg | Freq: Three times a day (TID) | INTRAMUSCULAR | Status: DC | PRN
  Administered 2012-09-13: 5 mg via INTRAVENOUS

## 2012-09-13 MED ORDER — ONDANSETRON HCL 4 MG PO TABS: 4.0000 mg | ORAL_TABLET | Freq: Four times a day (QID) | ORAL | Status: DC | PRN

## 2012-09-13 MED ORDER — HYDROMORPHONE HCL PF 1 MG/ML IJ SOLN
0.5000 mg | INTRAMUSCULAR | Status: DC | PRN
  Administered 2012-09-13 – 2012-09-15 (×10): 1 mg via INTRAVENOUS
  Filled 2012-09-13 (×10): qty 1

## 2012-09-13 MED ORDER — LIDOCAINE HCL (CARDIAC) 20 MG/ML IV SOLN
INTRAVENOUS | Status: DC | PRN
  Administered 2012-09-13: 100 mg via INTRAVENOUS

## 2012-09-13 MED ORDER — ONDANSETRON HCL 4 MG/2ML IJ SOLN
INTRAMUSCULAR | Status: DC | PRN
  Administered 2012-09-13: 4 mg via INTRAVENOUS

## 2012-09-13 MED ORDER — OXYCODONE-ACETAMINOPHEN 5-325 MG PO TABS
ORAL_TABLET | ORAL | Status: AC
  Filled 2012-09-13: qty 2

## 2012-09-13 MED ORDER — LACTATED RINGERS IV SOLN
INTRAVENOUS | Status: DC
  Administered 2012-09-13: 14:00:00 via INTRAVENOUS

## 2012-09-13 MED ORDER — LACTATED RINGERS IV SOLN
INTRAVENOUS | Status: DC | PRN
  Administered 2012-09-13: 14:00:00 via INTRAVENOUS

## 2012-09-13 MED ORDER — SODIUM CHLORIDE 0.9 % IV SOLN
INTRAVENOUS | Status: DC
  Administered 2012-09-13: 20 mL/h via INTRAVENOUS

## 2012-09-13 MED ORDER — DIAZEPAM 5 MG/ML IJ SOLN
INTRAMUSCULAR | Status: AC
  Filled 2012-09-13: qty 2

## 2012-09-13 MED ORDER — ONDANSETRON HCL 4 MG/2ML IJ SOLN: 4.0000 mg | Freq: Four times a day (QID) | INTRAMUSCULAR | Status: DC | PRN

## 2012-09-13 MED ORDER — OXYCODONE-ACETAMINOPHEN 5-325 MG PO TABS
1.0000 | ORAL_TABLET | ORAL | Status: DC | PRN
  Administered 2012-09-13 – 2012-09-16 (×10): 2 via ORAL
  Filled 2012-09-13 (×9): qty 2

## 2012-09-13 MED ORDER — 0.9 % SODIUM CHLORIDE (POUR BTL) OPTIME
TOPICAL | Status: DC | PRN
  Administered 2012-09-13: 1000 mL

## 2012-09-13 MED ORDER — PROPOFOL 10 MG/ML IV BOLUS
INTRAVENOUS | Status: DC | PRN
  Administered 2012-09-13: 200 mg via INTRAVENOUS

## 2012-09-13 MED ORDER — METOCLOPRAMIDE HCL 5 MG PO TABS: 5.0000 mg | ORAL_TABLET | Freq: Three times a day (TID) | ORAL | Status: DC | PRN

## 2012-09-13 MED ORDER — MIDAZOLAM HCL 5 MG/5ML IJ SOLN
INTRAMUSCULAR | Status: DC | PRN
  Administered 2012-09-13: 2 mg via INTRAVENOUS

## 2012-09-13 MED ORDER — FENTANYL CITRATE 0.05 MG/ML IJ SOLN: 50.0000 ug | Freq: Once | INTRAMUSCULAR | Status: DC

## 2012-09-13 MED ORDER — FENTANYL CITRATE 0.05 MG/ML IJ SOLN
INTRAMUSCULAR | Status: DC | PRN
  Administered 2012-09-13 (×5): 50 ug via INTRAVENOUS

## 2012-09-13 MED ORDER — METOCLOPRAMIDE HCL 5 MG/ML IJ SOLN: 5.0000 mg | Freq: Three times a day (TID) | INTRAMUSCULAR | Status: DC | PRN

## 2012-09-13 MED ORDER — HYDROCODONE-ACETAMINOPHEN 5-325 MG PO TABS: 1.0000 | ORAL_TABLET | ORAL | Status: DC | PRN

## 2012-09-13 MED ORDER — HYDROMORPHONE HCL PF 1 MG/ML IJ SOLN
INTRAMUSCULAR | Status: AC
  Filled 2012-09-13: qty 1

## 2012-09-13 MED ORDER — HYDROMORPHONE HCL PF 1 MG/ML IJ SOLN
0.2500 mg | INTRAMUSCULAR | Status: DC | PRN
  Administered 2012-09-13 (×6): 0.5 mg via INTRAVENOUS

## 2012-09-13 SURGICAL SUPPLY — 45 items
BANDAGE ESMARK 6X9 LF (GAUZE/BANDAGES/DRESSINGS) ×1 IMPLANT
BANDAGE GAUZE ELAST BULKY 4 IN (GAUZE/BANDAGES/DRESSINGS) ×4 IMPLANT
BLADE SAW RECIP 87.9 MT (BLADE) ×2 IMPLANT
BLADE SURG 21 STRL SS (BLADE) ×2 IMPLANT
BNDG CMPR 9X6 STRL LF SNTH (GAUZE/BANDAGES/DRESSINGS)
BNDG COHESIVE 6X5 TAN STRL LF (GAUZE/BANDAGES/DRESSINGS) ×3 IMPLANT
BNDG ESMARK 6X9 LF (GAUZE/BANDAGES/DRESSINGS)
CLOTH BEACON ORANGE TIMEOUT ST (SAFETY) ×2 IMPLANT
COVER SURGICAL LIGHT HANDLE (MISCELLANEOUS) ×2 IMPLANT
CUFF TOURNIQUET SINGLE 34IN LL (TOURNIQUET CUFF) ×1 IMPLANT
CUFF TOURNIQUET SINGLE 44IN (TOURNIQUET CUFF) IMPLANT
DRAIN PENROSE 1/2X12 LTX STRL (WOUND CARE) IMPLANT
DRAPE EXTREMITY T 121X128X90 (DRAPE) ×2 IMPLANT
DRAPE PROXIMA HALF (DRAPES) ×4 IMPLANT
DRAPE U-SHAPE 47X51 STRL (DRAPES) ×4 IMPLANT
DRSG ADAPTIC 3X8 NADH LF (GAUZE/BANDAGES/DRESSINGS) ×2 IMPLANT
DRSG PAD ABDOMINAL 8X10 ST (GAUZE/BANDAGES/DRESSINGS) ×3 IMPLANT
DURAPREP 26ML APPLICATOR (WOUND CARE) ×2 IMPLANT
ELECT REM PT RETURN 9FT ADLT (ELECTROSURGICAL) ×2
ELECTRODE REM PT RTRN 9FT ADLT (ELECTROSURGICAL) ×1 IMPLANT
EVACUATOR 1/8 PVC DRAIN (DRAIN) IMPLANT
GLOVE BIOGEL PI IND STRL 9 (GLOVE) ×1 IMPLANT
GLOVE BIOGEL PI INDICATOR 9 (GLOVE) ×1
GLOVE SURG ORTHO 9.0 STRL STRW (GLOVE) ×2 IMPLANT
GOWN PREVENTION PLUS XLARGE (GOWN DISPOSABLE) ×3 IMPLANT
GOWN SRG XL XLNG 56XLVL 4 (GOWN DISPOSABLE) ×1 IMPLANT
GOWN STRL NON-REIN XL XLG LVL4 (GOWN DISPOSABLE) ×2
KIT BASIN OR (CUSTOM PROCEDURE TRAY) ×2 IMPLANT
KIT ROOM TURNOVER OR (KITS) ×2 IMPLANT
MANIFOLD NEPTUNE II (INSTRUMENTS) ×2 IMPLANT
NS IRRIG 1000ML POUR BTL (IV SOLUTION) ×2 IMPLANT
PACK GENERAL/GYN (CUSTOM PROCEDURE TRAY) ×2 IMPLANT
PAD ARMBOARD 7.5X6 YLW CONV (MISCELLANEOUS) ×4 IMPLANT
SPONGE GAUZE 4X4 12PLY (GAUZE/BANDAGES/DRESSINGS) ×2 IMPLANT
SPONGE LAP 18X18 X RAY DECT (DISPOSABLE) ×1 IMPLANT
STAPLER VISISTAT 35W (STAPLE) ×1 IMPLANT
STOCKINETTE IMPERVIOUS LG (DRAPES) ×2 IMPLANT
SUT PDS AB 1 CT  36 (SUTURE) ×2
SUT PDS AB 1 CT 36 (SUTURE) IMPLANT
SUT SILK 2 0 (SUTURE) ×2
SUT SILK 2-0 18XBRD TIE 12 (SUTURE) ×1 IMPLANT
TOWEL OR 17X24 6PK STRL BLUE (TOWEL DISPOSABLE) ×2 IMPLANT
TOWEL OR 17X26 10 PK STRL BLUE (TOWEL DISPOSABLE) ×2 IMPLANT
TUBE ANAEROBIC SPECIMEN COL (MISCELLANEOUS) IMPLANT
WATER STERILE IRR 1000ML POUR (IV SOLUTION) ×1 IMPLANT

## 2012-09-13 NOTE — Anesthesia Preprocedure Evaluation (Signed)
Anesthesia Evaluation  Patient identified by MRN, date of birth, ID band Patient awake    Reviewed: Allergy & Precautions, H&P , NPO status , Patient's Chart, lab work & pertinent test results  Airway Mallampati: I TM Distance: >3 FB Neck ROM: Full    Dental   Pulmonary  + rhonchi         Cardiovascular hypertension, Pt. on medications + Peripheral Vascular Disease Rhythm:Regular Rate:Normal     Neuro/Psych Depression  Neuromuscular disease    GI/Hepatic PUD, GERD-  ,(+)     substance abuse  cocaine use and marijuana use,   Endo/Other  diabetes  Renal/GU Renal disease     Musculoskeletal   Abdominal   Peds  Hematology  (+) Blood dyscrasia, ,   Anesthesia Other Findings   Reproductive/Obstetrics                           Anesthesia Physical Anesthesia Plan  ASA: III  Anesthesia Plan: General   Post-op Pain Management:    Induction: Intravenous  Airway Management Planned: LMA  Additional Equipment:   Intra-op Plan:   Post-operative Plan: Extubation in OR  Informed Consent: I have reviewed the patients History and Physical, chart, labs and discussed the procedure including the risks, benefits and alternatives for the proposed anesthesia with the patient or authorized representative who has indicated his/her understanding and acceptance.     Plan Discussed with: CRNA and Surgeon  Anesthesia Plan Comments:         Anesthesia Quick Evaluation

## 2012-09-13 NOTE — Interval H&P Note (Signed)
History and Physical Interval Note:  09/13/2012 6:23 AM  [name]  has presented today for surgery, with the diagnosis of Abscess Right Foot  The various methods of treatment have been discussed with the patient and family. After consideration of risks, benefits and other options for treatment, the patient has consented to  Procedure(s) with comments: AMPUTATION BELOW KNEE (Right) - Right Below Knee Amputation as a surgical intervention .  The patient's history has been reviewed, patient examined, no change in status, stable for surgery.  I have reviewed the patient's chart and labs.  Questions were answered to the patient's satisfaction.     [name]

## 2012-09-13 NOTE — Anesthesia Procedure Notes (Signed)
Procedure Name: LMA Insertion Date/Time: 09/13/2012 2:25 PM Performed by: Margaree Mackintosh Pre-anesthesia Checklist: Patient identified, Timeout performed, Emergency Drugs available, Suction available and Patient being monitored Patient Re-evaluated:Patient Re-evaluated prior to inductionOxygen Delivery Method: Circle system utilized Preoxygenation: Pre-oxygenation with 100% oxygen Intubation Type: IV induction LMA: LMA inserted LMA Size: 4.0 Number of attempts: 1 Placement Confirmation: positive ETCO2 and breath sounds checked- equal and bilateral Tube secured with: Tape Dental Injury: Teeth and Oropharynx as per pre-operative assessment

## 2012-09-13 NOTE — H&P (View-Only) (Signed)
Patient ID: [name], male   DOB: [birth year], 47 y.o.   MRN: [id number] Plan for right transtibial amputation Friday afternoon. N.p.o. after midnight tonight.

## 2012-09-13 NOTE — Preoperative (Signed)
Beta Blockers   Reason not to administer Beta Blockers:Not Applicable 

## 2012-09-13 NOTE — Anesthesia Postprocedure Evaluation (Signed)
  Anesthesia Post-op Note  Patient: [name]  Procedure(s) Performed: Procedure(s) with comments: AMPUTATION BELOW KNEE (Right) - Right Below Knee Amputation  Patient Location: PACU  Anesthesia Type:General  Level of Consciousness: awake  Airway and Oxygen Therapy: Patient Spontanous Breathing  Post-op Pain: mild  Post-op Assessment: Post-op Vital signs reviewed, Patient's Cardiovascular Status Stable, Respiratory Function Stable, Patent Airway, No signs of Nausea or vomiting and Pain level controlled  Post-op Vital Signs: stable  Complications: No apparent anesthesia complications
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
## 2012-09-13 NOTE — Progress Notes (Signed)
ANTIBIOTIC CONSULT NOTE - FOLLOW UP  Pharmacy Consult for Vancomycin and Zosyn Indication: Chronic osteomyelitis  No Known Allergies  Patient Measurements: Height: 5' 8.11" (173 cm) Weight: 194 lb 4.8 oz (88.134 kg) IBW/kg (Calculated) : 68.65 Adjusted Body Weight:   Vital Signs: Temp: 98.2 F (36.8 C) (06/06 1000) Temp src: Oral (06/06 1000) BP: 131/66 mmHg (06/06 1000) Pulse Rate: 95 (06/06 1000) Intake/Output from previous day: 06/05 0701 - 06/06 0700 In: 360 [P.O.:360] Out: 1900 [Urine:1900] Intake/Output from this shift: Total I/O In: 50 [I.V.:50] Out: -   Labs:  Recent Labs  09/11/12 0500 09/12/12 0900 09/13/12 0440  WBC 17.8* 18.0* 15.1*  HGB 7.8* 7.4* 7.4*  PLT 649* 601* 607*  CREATININE 1.30 1.37* 1.43*   Estimated Creatinine Clearance: 69.1 ml/min (by C-G formula based on Cr of 1.43). No results found for this basename: VANCOTROUGH, Leodis Binet, VANCORANDOM, GENTTROUGH, GENTPEAK, GENTRANDOM, TOBRATROUGH, TOBRAPEAK, TOBRARND, AMIKACINPEAK, AMIKACINTROU, AMIKACIN,  in the last 72 hours   Microbiology: Recent Results (from the past 720 hour(s))  CULTURE, BLOOD (ROUTINE X 2)     Status: None   Collection Time    08/28/12  8:45 PM      Result Value Range Status   Specimen Description BLOOD ARM RIGHT   Final   Special Requests BOTTLES DRAWN AEROBIC AND ANAEROBIC 10CC   Final   Culture  Setup Time 08/29/2012 05:08   Final   Culture NO GROWTH 5 DAYS   Final   Report Status 09/04/2012 FINAL   Final  CULTURE, BLOOD (ROUTINE X 2)     Status: None   Collection Time    08/28/12  8:55 PM      Result Value Range Status   Specimen Description BLOOD ARM LEFT   Final   Special Requests BOTTLES DRAWN AEROBIC AND ANAEROBIC 10CC   Final   Culture  Setup Time 08/29/2012 05:08   Final   Culture NO GROWTH 5 DAYS   Final   Report Status 09/04/2012 FINAL   Final  ANAEROBIC CULTURE     Status: None   Collection Time    08/28/12  9:50 PM      Result Value Range Status   Specimen Description ABSCESS HAND LEFT   Final   Special Requests PATIENT ON FOLLOWING CLEOCIN   Final   Gram Stain     Final   Value: FEW WBC PRESENT,BOTH PMN AND MONONUCLEAR     NO SQUAMOUS EPITHELIAL CELLS SEEN     FEW GRAM POSITIVE COCCI     IN PAIRS   Culture NO ANAEROBES ISOLATED   Final   Report Status 09/02/2012 FINAL   Final  CULTURE, ROUTINE-ABSCESS     Status: None   Collection Time    08/28/12  9:50 PM      Result Value Range Status   Specimen Description ABSCESS HAND LEFT   Final   Special Requests PATIENT ON FOLLOWING CLEOCIN   Final   Gram Stain     Final   Value: FEW WBC PRESENT,BOTH PMN AND MONONUCLEAR     NO SQUAMOUS EPITHELIAL CELLS SEEN     FEW GRAM POSITIVE COCCI     IN PAIRS   Culture     Final   Value: MODERATE GROUP B STREP(S.AGALACTIAE)ISOLATED     Note: TESTING AGAINST S. AGALACTIAE NOT ROUTINELY PERFORMED DUE TO PREDICTABILITY OF AMP/PEN/VAN SUSCEPTIBILITY.   Report Status 08/31/2012 FINAL   Final  SURGICAL PCR SCREEN     Status: None  Collection Time    08/30/12 10:35 AM      Result Value Range Status   MRSA, PCR NEGATIVE  NEGATIVE Final   Staphylococcus aureus NEGATIVE  NEGATIVE Final   Comment:            The Xpert SA Assay (FDA     approved for NASAL specimens     in patients over 70 years of age),     is one component of     a comprehensive surveillance     program.  Test performance has     been validated by [employer] for patients greater     than or equal to 84 year old.     It is not intended     to diagnose infection nor to     guide or monitor treatment.  WOUND CULTURE     Status: None   Collection Time    09/10/12 10:36 PM      Result Value Range Status   Specimen Description WOUND LEFT FOOT   Final   Special Requests NONE   Final   Gram Stain     Final   Value: NO WBC SEEN     NO SQUAMOUS EPITHELIAL CELLS SEEN     ABUNDANT GRAM VARIABLE ROD   Culture FEW PROTEUS MIRABILIS   Final   Report Status 09/13/2012 FINAL    Final   Organism ID, Bacteria PROTEUS MIRABILIS   Final  CULTURE, BLOOD (ROUTINE X 2)     Status: None   Collection Time    09/10/12 10:45 PM      Result Value Range Status   Specimen Description BLOOD ARM RIGHT   Final   Special Requests BOTTLES DRAWN AEROBIC AND ANAEROBIC 10CC   Final   Culture  Setup Time 09/11/2012 04:45   Final   Culture     Final   Value:        BLOOD CULTURE RECEIVED NO GROWTH TO DATE CULTURE WILL BE HELD FOR 5 DAYS BEFORE ISSUING A FINAL NEGATIVE REPORT   Report Status PENDING   Incomplete  CULTURE, BLOOD (ROUTINE X 2)     Status: None   Collection Time    09/10/12 11:00 PM      Result Value Range Status   Specimen Description BLOOD ARM LEFT   Final   Special Requests BOTTLES DRAWN AEROBIC AND ANAEROBIC 10CC   Final   Culture  Setup Time 09/11/2012 04:43   Final   Culture     Final   Value:        BLOOD CULTURE RECEIVED NO GROWTH TO DATE CULTURE WILL BE HELD FOR 5 DAYS BEFORE ISSUING A FINAL NEGATIVE REPORT   Report Status PENDING   Incomplete  SURGICAL PCR SCREEN     Status: None   Collection Time    09/11/12  8:03 PM      Result Value Range Status   MRSA, PCR NEGATIVE  NEGATIVE Final   Staphylococcus aureus NEGATIVE  NEGATIVE Final   Comment:            The Xpert SA Assay (FDA     approved for NASAL specimens     in patients over 56 years of age),     is one component of     a comprehensive surveillance     program.  Test performance has     been validated by [employer] for patients greater  than or equal to 50 year old.     It is not intended     to diagnose infection nor to     guide or monitor treatment.    Anti-infectives   Start     Dose/Rate Route Frequency Ordered Stop   09/11/12 1200  piperacillin-tazobactam (ZOSYN) IVPB 3.375 g     3.375 g 12.5 mL/hr over 240 Minutes Intravenous 3 times per day 09/11/12 0935     09/11/12 1000  vancomycin (VANCOCIN) IVPB 1000 mg/200 mL premix     1,000 mg 200 mL/hr over 60 Minutes  Intravenous Every 12 hours 09/11/12 0043     09/11/12 0200  ceFEPIme (MAXIPIME) 1 g in dextrose 5 % 50 mL IVPB  Status:  Discontinued     1 g 100 mL/hr over 30 Minutes Intravenous Every 8 hours 09/11/12 0043 09/11/12 0924   09/11/12 0045  vancomycin (VANCOCIN) 1,500 mg in sodium chloride 0.9 % 500 mL IVPB     1,500 mg 250 mL/hr over 120 Minutes Intravenous  Once 09/11/12 0043 09/11/12 0324      Assessment: 47yo male with chronic osteomyelitis and (+)gas in soft tissues per Xray.  Today is day#2 of Vancomycin and Zosyn.  Blood cultures are NTD and Wound culture is (+)Proteus mirabilis which is sensitive to Zosyn.  Cr has increased to 1.43 from admission Cr of 1.3.  UOP is good.  Goal of Therapy:  Vancomycin trough level 15-20 mcg/ml  Plan:  1.  Check steady state Vancomycin trough this weekend, if continued 2.  Watch renal function 3.  F/U blood cultures  Marisue Humble, PharmD Clinical Pharmacist [facility]

## 2012-09-13 NOTE — Transfer of Care (Signed)
Immediate Anesthesia Transfer of Care Note  Patient: [name]  Procedure(s) Performed: Procedure(s) with comments: AMPUTATION BELOW KNEE (Right) - Right Below Knee Amputation  Patient Location: PACU  Anesthesia Type:General  Level of Consciousness: awake, alert  and oriented  Airway & Oxygen Therapy: Patient Spontanous Breathing and Patient connected to nasal cannula oxygen  Post-op Assessment: Report given to PACU RN and Post -op Vital signs reviewed and stable  Post vital signs: Reviewed and stable  Complications: No apparent anesthesia complications
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
## 2012-09-13 NOTE — Op Note (Signed)
OPERATIVE REPORT  DATE OF SURGERY: 09/13/2012  PATIENT:  [name],  47 y.o. male  PRE-OPERATIVE DIAGNOSIS:  Abscess Right Foot  POST-OPERATIVE DIAGNOSIS:  Abscess osteomyelitis right foot  PROCEDURE:  Procedure(s): AMPUTATION BELOW KNEE  SURGEON:  Surgeon(s): Nadara Mustard, MD  ANESTHESIA:   general  EBL:  Minimal ML  SPECIMEN:  Source of Specimen:  Right leg  TOURNIQUET:   Total Tourniquet Time Documented: Thigh (Right) - 7 minutes Total: Thigh (Right) - 7 minutes   PROCEDURE DETAILS: Patient is a 47 year old gentleman with peripheral vascular disease diabetes. Patient is previously undergone a first ray amputation the right foot and a transmetatarsal amputation the left foot. The left foot incision is healing well. Patient presented with osteomyelitis abscess of the right foot and presents at this time for a transtibial amputation do to nonviable tissue for any foot salvage surgery. Patient states he understands and wished to proceed at this time risks and benefits were discussed. Description of procedure patient was brought to the operating room and underwent a general anesthetic. After adequate levels of anesthesia were obtained patient's right lower extremity was prepped using DuraPrep draped into a sterile field and an impervious stockinette was used to drape out the right foot so the wound was exposed to the surgical field. The tourniquet was inflated. A transverse incision was made 11 cm distal to the tibial tubercle this curved proximally and a large posterior flap was created. The tibia was transected and beveled just proximal to the skin incision the fibula was transected just proximal to the tibial incision. Amputation knife was used to create a large posterior flap. The sciatic nerve was pulled cut and allowed to retract. The vascular bundles were suture ligated x2 with silk suture. Tourniquet was deflated hemostasis was obtained. The deep and superficial fascial layers  were closed using #1 PDS suture. The skin was closed using staples. The wound was covered with Adaptic orthopedic sponges AB dressing Kerlix and Coban. Patient was extubated taken to the PACU in stable condition.  PLAN OF CARE: Admit to inpatient   PATIENT DISPOSITION:  PACU - hemodynamically stable.   Nadara Mustard, MD 09/13/2012 3:04 PM

## 2012-09-14 DIAGNOSIS — S88119A Complete traumatic amputation at level between knee and ankle, unspecified lower leg, initial encounter: Secondary | ICD-10-CM

## 2012-09-14 DIAGNOSIS — M869 Osteomyelitis, unspecified: Secondary | ICD-10-CM

## 2012-09-14 LAB — PREPARE FRESH FROZEN PLASMA
Unit division: 0
Unit division: 0

## 2012-09-14 LAB — GLUCOSE, CAPILLARY
Glucose-Capillary: 264 mg/dL — ABNORMAL HIGH (ref 70–99)
Glucose-Capillary: 144 mg/dL — ABNORMAL HIGH (ref 70–99)
Glucose-Capillary: 212 mg/dL — ABNORMAL HIGH (ref 70–99)

## 2012-09-14 LAB — PROTIME-INR
INR: 2.41 — ABNORMAL HIGH (ref 0.00–1.49)
Prothrombin Time: 25.1 s — ABNORMAL HIGH (ref 11.6–15.2)

## 2012-09-14 MED ORDER — WARFARIN SODIUM 4 MG PO TABS
4.0000 mg | ORAL_TABLET | Freq: Once | ORAL | Status: AC
  Administered 2012-09-14: 4 mg via ORAL
  Filled 2012-09-14 (×2): qty 1

## 2012-09-14 MED ORDER — OXYCODONE-ACETAMINOPHEN 5-325 MG PO TABS: 1.0000 | ORAL_TABLET | ORAL | Status: DC | PRN

## 2012-09-14 NOTE — Progress Notes (Signed)
ANTICOAGULATION CONSULT NOTE - Follow Up Consult  Pharmacy Consult for Coumadin Indication: VTE prophylaxis  No Known Allergies  Patient Measurements: Height: 5' 8.11" (173 cm) Weight: 194 lb 4.8 oz (88.134 kg) IBW/kg (Calculated) : 68.65 Heparin Dosing Weight:   Vital Signs: Temp: 97.3 F (36.3 C) (06/07 0643) Temp src: Oral (06/07 0643) BP: 169/93 mmHg (06/07 0643) Pulse Rate: 97 (06/07 0643)  Labs:  Recent Labs  09/12/12 0505  09/12/12 0900 09/13/12 0440 09/13/12 1816 09/14/12 0550  HGB  --   < > 7.4* 7.4* 7.9*  --   HCT  --   --  22.7* 22.7* 24.6*  --   PLT  --   --  601* 607* 617*  --   LABPROT 28.8*  --   --  25.8*  --  25.1*  INR 2.90*  --   --  2.50*  --  2.41*  CREATININE  --   --  1.37* 1.43* 1.28  --   < > = values in this interval not displayed.  Estimated Creatinine Clearance: 77.2 ml/min (by C-G formula based on Cr of 1.28).   Medications:  Scheduled:  . atorvastatin  20 mg Oral Daily  . FLUoxetine  10 mg Oral Daily  . gabapentin  300 mg Oral TID  . insulin aspart  0-15 Units Subcutaneous TID WC  . insulin glargine  36 Units Subcutaneous QHS  . lisinopril  40 mg Oral Daily  . pantoprazole  40 mg Oral Daily  . piperacillin-tazobactam (ZOSYN)  IV  3.375 g Intravenous Q8H  . vancomycin  1,000 mg Intravenous Q12H  . warfarin  4 mg Oral ONCE-1800  . 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  Verified with Dr. Burtis Junes that this was okay with the surgeon.  INR is 2.41 today with last Coumadin dose on 6/4.  No bleeding problems noted.  Goal of Therapy:  INR 2-3 Monitor platelets by anticoagulation protocol: Yes   Plan:  1.  Coumadin 4mg  2.  Daily INR  Marisue Humble, PharmD Clinical Pharmacist [facility]

## 2012-09-14 NOTE — Progress Notes (Signed)
Subjective: Pt doing well this AM. No acute events overnight. Pt had right BKA on 6/6 w/o complication. Pt having minimal pain being well controlled on prn dilaudid and oral Vicodin. Pt states having some sweating and attributing to Vicodin and will not want to continue taking this medication.   Objective: Vital signs in last 24 hours: Filed Vitals:   09/13/12 2230 09/13/12 2300 09/14/12 0142 09/14/12 0643  BP: 173/93 165/84 167/94 169/93  Pulse: 88  95 97  Temp: 97.3 F (36.3 C)  98.2 F (36.8 C) 97.3 F (36.3 C)  TempSrc: Oral  Oral Oral  Resp: 18  18 18   Height:      Weight:      SpO2: 100%  100% 100%   Weight change:   Intake/Output Summary (Last 24 hours) at 09/14/12 0714 Last data filed at 09/14/12 0453  Gross per 24 hour  Intake   1370 ml  Output   1115 ml  Net    255 ml   General: resting in bed, NAD HEENT: PERRL, EOMI, no scleral icterus Cardiac: RRR, no rubs, murmurs or gallops Pulm: clear to auscultation bilaterally, moving normal volumes of air Abd: soft, nontender, nondistended, BS present Ext: warm and well perfused, no pedal edema, rt LE with bandage c/d/i, left foot in gauze c/d/i Neuro: alert and oriented X3, cranial nerves II-XII grossly intact  Lab Results: Basic Metabolic Panel:  Recent Labs Lab 09/13/12 0440 09/13/12 1816  NA 132* 134*  K 3.9 4.0  CL 98 98  CO2 24 25  GLUCOSE 157* 123*  BUN 14 12  CREATININE 1.43* 1.28  CALCIUM 8.5 9.1   CBC:  Recent Labs Lab 09/13/12 0440 09/13/12 1816  WBC 15.1* 10.9*  HGB 7.4* 7.9*  HCT 22.7* 24.6*  MCV 87.3 87.5  PLT 607* 617*   CBG:  Recent Labs Lab 09/13/12 0758 09/13/12 1155 09/13/12 1354 09/13/12 1511 09/13/12 1706 09/13/12 2229  GLUCAP 98 91 72 74 114* 281*   Coagulation:  Recent Labs Lab 09/11/12 0639 09/12/12 0505 09/13/12 0440 09/14/12 0550  LABPROT 30.3* 28.8* 25.8* 25.1*  INR 3.10* 2.90* 2.50* 2.41*   Urine Drug Screen: Drugs of Abuse     Component Value  Date/Time   LABOPIA NONE DETECTED 07/27/2012 0153   COCAINSCRNUR NONE DETECTED 07/27/2012 0153   COCAINSCRNUR NEG 09/22/2008 2125   LABBENZ NONE DETECTED 07/27/2012 0153   LABBENZ NEG 09/22/2008 2125   AMPHETMU NONE DETECTED 07/27/2012 0153   AMPHETMU NEG 09/22/2008 2125   THCU POSITIVE* 07/27/2012 0153   LABBARB NONE DETECTED 07/27/2012 0153     Micro Results: Recent Results (from the past 240 hour(s))  WOUND CULTURE     Status: None   Collection Time    09/10/12 10:36 PM      Result Value Range Status   Specimen Description WOUND LEFT FOOT   Final   Special Requests NONE   Final   Gram Stain     Final   Value: NO WBC SEEN     NO SQUAMOUS EPITHELIAL CELLS SEEN     ABUNDANT GRAM VARIABLE ROD   Culture FEW PROTEUS MIRABILIS   Final   Report Status 09/13/2012 FINAL   Final   Organism ID, Bacteria PROTEUS MIRABILIS   Final  CULTURE, BLOOD (ROUTINE X 2)     Status: None   Collection Time    09/10/12 10:45 PM      Result Value Range Status   Specimen Description BLOOD ARM RIGHT  Final   Special Requests BOTTLES DRAWN AEROBIC AND ANAEROBIC 10CC   Final   Culture  Setup Time 09/11/2012 04:45   Final   Culture     Final   Value:        BLOOD CULTURE RECEIVED NO GROWTH TO DATE CULTURE WILL BE HELD FOR 5 DAYS BEFORE ISSUING A FINAL NEGATIVE REPORT   Report Status PENDING   Incomplete  CULTURE, BLOOD (ROUTINE X 2)     Status: None   Collection Time    09/10/12 11:00 PM      Result Value Range Status   Specimen Description BLOOD ARM LEFT   Final   Special Requests BOTTLES DRAWN AEROBIC AND ANAEROBIC 10CC   Final   Culture  Setup Time 09/11/2012 04:43   Final   Culture     Final   Value:        BLOOD CULTURE RECEIVED NO GROWTH TO DATE CULTURE WILL BE HELD FOR 5 DAYS BEFORE ISSUING A FINAL NEGATIVE REPORT   Report Status PENDING   Incomplete  SURGICAL PCR SCREEN     Status: None   Collection Time    09/11/12  8:03 PM      Result Value Range Status   MRSA, PCR NEGATIVE  NEGATIVE Final    Staphylococcus aureus NEGATIVE  NEGATIVE Final   Comment:            The Xpert SA Assay (FDA     approved for NASAL specimens     in patients over 19 years of age),     is one component of     a comprehensive surveillance     program.  Test performance has     been validated by [employer] for patients greater     than or equal to 69 year old.     It is not intended     to diagnose infection nor to     guide or monitor treatment.   Studies/Results: No results found. Medications: I have reviewed the patient's current medications. Scheduled Meds: . atorvastatin  20 mg Oral Daily  . FLUoxetine  10 mg Oral Daily  . gabapentin  300 mg Oral TID  . insulin aspart  0-15 Units Subcutaneous TID WC  . insulin glargine  36 Units Subcutaneous QHS  . lisinopril  40 mg Oral Daily  . pantoprazole  40 mg Oral Daily  . piperacillin-tazobactam (ZOSYN)  IV  3.375 g Intravenous Q8H  . vancomycin  1,000 mg Intravenous Q12H  . Warfarin - Pharmacist Dosing Inpatient   Does not apply q1800   Continuous Infusions: . sodium chloride 125 mL/hr at 09/12/12 0538  . sodium chloride 20 mL/hr (09/13/12 1651)  . lactated ringers 50 mL/hr at 09/13/12 1347   PRN Meds:.acetaminophen, acetaminophen, calcium carbonate, diazepam, HYDROcodone-acetaminophen, HYDROmorphone (DILAUDID) injection, HYDROmorphone (DILAUDID) injection, metoCLOPramide (REGLAN) injection, metoCLOPramide, ondansetron (ZOFRAN) IV, ondansetron, oxyCODONE, oxyCODONE-acetaminophen, senna-docusate, zolpidem Assessment/Plan: #Sepsis 2/2 OM and ulcers: Pt initally met SIRS criteria. Defervescening and resolving  Leukocytosis. 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 Lajoyce Corners. Repeat rt foot xray 6/3 some extensive gas pattern and soft tissue infection. Has been compliant with clindamycin. Dr. Laurell Josephs on 6/6 w/o complication. Wound Cx have been NTD with multiple G-rods on microscopy. BCx NTD.   - cont warfarin for DVT prophylaxsis - cont  Vanc/zosyn - Acetominophen prn for fever  - Oxycodone IR.  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 Home Lantus dose was recently increased from 25 u to 32 units. Despite his good compliance with insulin, CBGs above 400 at home. On admission was 469. Urine albumin-to-creatinine ratio elevated at 100.2 on 06/21/2012. Patient on lisinopril 40 mg daily. Patient also takes gabapentin 300 mg 3 times a day for diabetic neuropathy.   -Lantus 36 units at bedtime.  -Moderate sliding scale insulin.  -CBGs every 4 hours when pt is n.p.o.  -DM educator -Gabapentin and lisinopril   # Hypertension: At home he takes lisinopril 40 mg daily. Currently well controlled.  -cont to monitor  -Lisinopril 40 mg daily.   # Constipation: He reports that he has not had a BM since he was discharged on 5/27 likely in setting of opiate use s/p recent orthopedic surgery. - senna-S  -prn tap water enema  # Chronic Kidney disease stage 2: Baseline creatinine around 1.3. Slightly increased to 1.46 with BUN increased from 12 to 20 over the last 1 week. Likely cause is prerenal in the acute setting of sepsis with a reduced oral intake and fevers.  -cont to monitor -check bmet   # Depression: Stable. At home he takes fluoxetine 10 mg daily. Consult to spiritual care.   # Hyperlipidemia: LDL 06/21/12 129. Will continue with Lipitor 40mg  daily.   Dispo: Disposition is deferred at this time, awaiting improvement of current medical problems.  Anticipated discharge in approximately 3-4 day(s).   The patient does have a current PCP Zada Girt, Richard, MD), therefore will be require OPC follow-up after discharge.   The patient does not have transportation limitations that hinder  transportation to clinic appointments.  .Services Needed at time of discharge: Y = Yes, Blank = No PT:   OT:   RN:   Equipment:   Other:     LOS: 4 days   Christen Bame, MD 09/14/2012, 7:14 AM Pgr: [phone]

## 2012-09-14 NOTE — Evaluation (Signed)
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  Presents to PT with some dependecies in mobility. Pt has WC from last admission and pt in agreement that [facility] will be his primary mode of mobility.  He would like a new [employer] shoe for LLE.  Stressed the importance of protection of LLE site to promote healing.       PT Assessment  Patient needs continued PT services    Follow Up Recommendations  Home health PT (may not need HH pending progress)    Does the patient have the potential to tolerate intense rehabilitation      Barriers to Discharge        Equipment Recommendations  Other (comment) (pt would like sliding board)    Recommendations for Other Services     Frequency Min 3X/week    Precautions / Restrictions Precautions Precautions: Fall   Pertinent Vitals/Pain Reports 12/10 pain in R BKA site.        Mobility  Bed Mobility Bed Mobility: Supine to Sit;Sitting - Scoot to Edge of Bed Supine to Sit: 6: Modified independent (Device/Increase time) Sitting - Scoot to Edge of Bed: 6: Modified independent (Device/Increase time) Transfers Transfers: Sit to Stand;Stand to Sit;Squat Pivot Transfers. Practiced transfers several times Sit to Stand: 4: Min guard;From bed;With upper extremity assist Stand to Sit: 4: Min guard;With upper extremity assist;To bed Squat Pivot Transfers: 4: Min guard;With upper extremity assistance;With armrests (from bed to chair and chair to bed) Details for Transfer Assistance: cues for safest technique    Exercises Amputee Exercises Hip ABduction/ADduction: AROM;Right;5 reps Hip Flexion/Marching: AROM;Right;5 reps Knee Flexion: AROM;5 reps;Right (restricted ROM) Knee Extension: AROM;Right;5  reps   PT Diagnosis: Acute pain  PT Problem List: Decreased mobility;Decreased knowledge of use of DME;Pain PT Treatment Interventions: DME instruction;Functional mobility training;Therapeutic exercise;Therapeutic activities;Patient/family education   PT Goals Acute Rehab PT Goals PT Goal Formulation: With patient Time For Goal Achievement: 09/21/12 Potential to Achieve Goals: Good Pt will go Sit to Stand: with supervision;with upper extremity assist PT Goal: Sit to Stand - Progress: Goal set today Pt will Transfer Bed to Chair/Chair to Bed: with modified independence PT Transfer Goal: Bed to Chair/Chair to Bed - Progress: Goal set today  Visit Information  Last PT Received On: 09/14/12 Assistance Needed: +1    Subjective Data  Patient Stated Goal: to be able to get around in his [facility] and do transfers   Prior Functioning  Home Living Lives With: Family Available Help at Discharge: Family Type of Home: Apartment Home Access: Ramped entrance Home Layout: One level Home Adaptive Equipment: Straight cane;Walker - rolling;Crutches;Raised toilet seat with rails;Wheelchair - manual Prior Function Level of Independence: Independent with assistive device(s) Vocation: Unemployed Communication Communication: No difficulties    Cognition  Cognition Arousal/Alertness: Awake/alert Behavior During Therapy: WFL for tasks assessed/performed Overall Cognitive Status: Within Functional Limits for tasks assessed    Extremity/Trunk Assessment Right Upper Extremity Assessment RUE ROM/Strength/Tone: WFL for tasks assessed Left Upper Extremity Assessment LUE ROM/Strength/Tone: Within functional levels Right Lower Extremity Assessment RLE ROM/Strength/Tone: Deficits RLE ROM/Strength/Tone Deficits: decreased rom r knee, possibly due to dressing/bandage Left Lower Extremity Assessment LLE ROM/Strength/Tone: [facility] for tasks assessed Trunk Assessment Trunk Assessment: Normal   Balance  Balance Balance Assessed: Yes Static  Sitting Balance Static Sitting - Balance Support: No upper extremity supported Static Sitting - Level of Assistance: 7: Independent Static Sitting - Comment/# of Minutes: about 10-15  End of Session PT - End of Session Equipment Utilized During Treatment: Gait belt Activity Tolerance: Patient tolerated treatment well Patient left: in chair;with call bell/phone within reach Nurse Communication: Mobility status (spoke with NT)  GP     Donnella Sham 09/14/2012, 2:37 PM [place]ona [place], PT  [phone] 09/14/2012

## 2012-09-15 LAB — GLUCOSE, CAPILLARY
Glucose-Capillary: 269 mg/dL — ABNORMAL HIGH (ref 70–99)
Glucose-Capillary: 227 mg/dL — ABNORMAL HIGH (ref 70–99)
Glucose-Capillary: 309 mg/dL — ABNORMAL HIGH (ref 70–99)

## 2012-09-15 MED ORDER — POLYETHYLENE GLYCOL 3350 17 G PO PACK
17.0000 g | PACK | Freq: Every day | ORAL | Status: DC | PRN
  Administered 2012-09-15: 17 g via ORAL
  Filled 2012-09-15: qty 1

## 2012-09-15 MED ORDER — WARFARIN SODIUM 4 MG PO TABS
4.0000 mg | ORAL_TABLET | Freq: Once | ORAL | Status: AC
  Administered 2012-09-15: 4 mg via ORAL
  Filled 2012-09-15: qty 1

## 2012-09-15 NOTE — Progress Notes (Signed)
ANTICOAGULATION CONSULT NOTE - Follow Up Consult  Pharmacy Consult for Coumadin Indication: PVD, s/p ray amputation and BKA  No Known Allergies  Patient Measurements: Height: 5' 8.11" (173 cm) Weight: 185 lb 9.6 oz (84.188 kg) IBW/kg (Calculated) : 68.65 Heparin Dosing Weight:   Vital Signs: Temp: 96.8 F (36 C) (06/08 1413) Temp src: Oral (06/08 1413) BP: 165/92 mmHg (06/08 1413) Pulse Rate: 105 (06/08 1413)  Labs:  Recent Labs  09/13/12 0440 09/13/12 1816 09/14/12 0550 09/15/12 0542  HGB 7.4* 7.9*  --   --   HCT 22.7* 24.6*  --   --   PLT 607* 617*  --   --   LABPROT 25.8*  --  25.1* 20.7*  INR 2.50*  --  2.41* 1.85*  CREATININE 1.43* 1.28  --   --     Estimated Creatinine Clearance: 75.6 ml/min (by C-G formula based on Cr of 1.28).   Medications:  Scheduled:  . atorvastatin  20 mg Oral Daily  . FLUoxetine  10 mg Oral Daily  . gabapentin  300 mg Oral TID  . insulin aspart  0-15 Units Subcutaneous TID WC  . insulin glargine  36 Units Subcutaneous QHS  . lisinopril  40 mg Oral Daily  . pantoprazole  40 mg Oral Daily  . 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  Coumadin resumed yesterday.  INR 1.85 this AM.  No bleeding problems noted.  Home dose was 5mg  daily with INR 3.65 on admission.  Goal of Therapy:  INR 2-3 Monitor platelets by anticoagulation protocol: Yes   Plan:  1.  Coumadin 4mg  2.  F/U in AM  Marisue Humble, PharmD Clinical Pharmacist [facility]

## 2012-09-15 NOTE — Progress Notes (Signed)
Subjective: Pt doing well this AM. No acute events overnight. Pt no complaints. CBGs poorly controlled as pt also excessively eating while inpt.   Objective: Vital signs in last 24 hours: Filed Vitals:   09/14/12 0643 09/14/12 1400 09/14/12 2132 09/15/12 0503  BP: 169/93 158/89 149/73 160/89  Pulse: 97 94 108 94  Temp: 97.3 F (36.3 C) 98 F (36.7 C) 98.8 F (37.1 C) 98.3 F (36.8 C)  TempSrc: Oral Oral Oral Oral  Resp: 18 16 17 18   Height:      Weight:    185 lb 9.6 oz (84.188 kg)  SpO2: 100% 98% 100% 100%   Weight change:   Intake/Output Summary (Last 24 hours) at 09/15/12 8413 Last data filed at 09/15/12 0506  Gross per 24 hour  Intake 9503.83 ml  Output   1400 ml  Net 8103.83 ml   General: resting in bed, NAD HEENT: PERRL, EOMI, no scleral icterus Cardiac: RRR, no rubs, murmurs or gallops Pulm: clear to auscultation bilaterally, moving normal volumes of air Abd: soft, nontender, nondistended, BS present Ext: warm and well perfused, no pedal edema, rt LE with bandage c/d/i, left foot in gauze c/d/i Neuro: alert and oriented X3, cranial nerves II-XII grossly intact  Lab Results: Basic Metabolic Panel:  Recent Labs Lab 09/13/12 0440 09/13/12 1816  NA 132* 134*  K 3.9 4.0  CL 98 98  CO2 24 25  GLUCOSE 157* 123*  BUN 14 12  CREATININE 1.43* 1.28  CALCIUM 8.5 9.1   CBC:  Recent Labs Lab 09/13/12 0440 09/13/12 1816  WBC 15.1* 10.9*  HGB 7.4* 7.9*  HCT 22.7* 24.6*  MCV 87.3 87.5  PLT 607* 617*   CBG:  Recent Labs Lab 09/13/12 2229 09/14/12 0745 09/14/12 1227 09/14/12 1701 09/14/12 2131 09/15/12 0805  GLUCAP 281* 264* 144* 212* 253* 269*   Coagulation:  Recent Labs Lab 09/12/12 0505 09/13/12 0440 09/14/12 0550 09/15/12 0542  LABPROT 28.8* 25.8* 25.1* 20.7*  INR 2.90* 2.50* 2.41* 1.85*   Urine Drug Screen: Drugs of Abuse     Component Value Date/Time   LABOPIA NONE DETECTED 07/27/2012 0153   COCAINSCRNUR NONE DETECTED 07/27/2012  0153   COCAINSCRNUR NEG 09/22/2008 2125   LABBENZ NONE DETECTED 07/27/2012 0153   LABBENZ NEG 09/22/2008 2125   AMPHETMU NONE DETECTED 07/27/2012 0153   AMPHETMU NEG 09/22/2008 2125   THCU POSITIVE* 07/27/2012 0153   LABBARB NONE DETECTED 07/27/2012 0153     Micro Results: Recent Results (from the past 240 hour(s))  WOUND CULTURE     Status: None   Collection Time    09/10/12 10:36 PM      Result Value Range Status   Specimen Description WOUND LEFT FOOT   Final   Special Requests NONE   Final   Gram Stain     Final   Value: NO WBC SEEN     NO SQUAMOUS EPITHELIAL CELLS SEEN     ABUNDANT GRAM VARIABLE ROD   Culture FEW PROTEUS MIRABILIS   Final   Report Status 09/13/2012 FINAL   Final   Organism ID, Bacteria PROTEUS MIRABILIS   Final  CULTURE, BLOOD (ROUTINE X 2)     Status: None   Collection Time    09/10/12 10:45 PM      Result Value Range Status   Specimen Description BLOOD ARM RIGHT   Final   Special Requests BOTTLES DRAWN AEROBIC AND ANAEROBIC 10CC   Final   Culture  Setup Time 09/11/2012 04:45  Final   Culture     Final   Value:        BLOOD CULTURE RECEIVED NO GROWTH TO DATE CULTURE WILL BE HELD FOR 5 DAYS BEFORE ISSUING A FINAL NEGATIVE REPORT   Report Status PENDING   Incomplete  CULTURE, BLOOD (ROUTINE X 2)     Status: None   Collection Time    09/10/12 11:00 PM      Result Value Range Status   Specimen Description BLOOD ARM LEFT   Final   Special Requests BOTTLES DRAWN AEROBIC AND ANAEROBIC 10CC   Final   Culture  Setup Time 09/11/2012 04:43   Final   Culture     Final   Value:        BLOOD CULTURE RECEIVED NO GROWTH TO DATE CULTURE WILL BE HELD FOR 5 DAYS BEFORE ISSUING A FINAL NEGATIVE REPORT   Report Status PENDING   Incomplete  SURGICAL PCR SCREEN     Status: None   Collection Time    09/11/12  8:03 PM      Result Value Range Status   MRSA, PCR NEGATIVE  NEGATIVE Final   Staphylococcus aureus NEGATIVE  NEGATIVE Final   Comment:            The Xpert SA Assay  (FDA     approved for NASAL specimens     in patients over 82 years of age),     is one component of     a comprehensive surveillance     program.  Test performance has     been validated by [employer] for patients greater     than or equal to 69 year old.     It is not intended     to diagnose infection nor to     guide or monitor treatment.   Studies/Results: No results found. Medications: I have reviewed the patient's current medications. Scheduled Meds: . atorvastatin  20 mg Oral Daily  . FLUoxetine  10 mg Oral Daily  . gabapentin  300 mg Oral TID  . insulin aspart  0-15 Units Subcutaneous TID WC  . insulin glargine  36 Units Subcutaneous QHS  . lisinopril  40 mg Oral Daily  . pantoprazole  40 mg Oral Daily  . piperacillin-tazobactam (ZOSYN)  IV  3.375 g Intravenous Q8H  . vancomycin  1,000 mg Intravenous Q12H  . Warfarin - Pharmacist Dosing Inpatient   Does not apply q1800   Continuous Infusions: . sodium chloride 125 mL/hr at 09/12/12 0538  . sodium chloride 20 mL/hr (09/13/12 1651)  . lactated ringers 50 mL/hr at 09/13/12 1347   PRN Meds:.acetaminophen, acetaminophen, calcium carbonate, diazepam, HYDROmorphone (DILAUDID) injection, HYDROmorphone (DILAUDID) injection, metoCLOPramide (REGLAN) injection, metoCLOPramide, ondansetron (ZOFRAN) IV, ondansetron, oxyCODONE, oxyCODONE-acetaminophen, senna-docusate, zolpidem Assessment/Plan: #Sepsis 2/2 OM and ulcers: Pt initally met SIRS criteria. Defervescening and resolving  Leukocytosis. 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 Lajoyce Corners. Repeat rt foot xray 6/3 some extensive gas pattern and soft tissue infection. Has been compliant with clindamycin. Dr. Laurell Josephs on 6/6 w/o complication. Wound Cx have been NTD with multiple G-rods on microscopy. BCx NTD.   - cont warfarin  for DVT prophylaxsis - d/c IV Vanc/zosyn - Acetominophen prn for fever  - Oxycodone IR.  - IV normal saline at 100 cc per hour  # Uncontrolled type  II diabetes mellitus with nephropathy and peripheral neuropathy: A1c 13.7% on 06/21/2012. Home Lantus dose was recently increased from 25 u to 32 units. Despite his good compliance with insulin, CBGs above 400 at home. On admission was 469. Urine albumin-to-creatinine ratio elevated at 100.2 on 06/21/2012. Patient on lisinopril 40 mg daily. Patient also takes gabapentin 300 mg 3 times a day for diabetic neuropathy.   -Lantus 36 units at bedtime.  -Moderate sliding scale insulin.  -CBGs every 4 hours when pt is n.p.o.  -DM educator -Gabapentin and lisinopril   # Hypertension: At home he takes lisinopril 40 mg daily. Currently well controlled.  -cont to monitor  -Lisinopril 40 mg daily.   # Constipation: He reports that he has not had a BM since he was discharged on 5/27 likely in setting of opiate use s/p recent orthopedic surgery. - senna-S  -prn tap water enema  # Chronic Kidney disease stage 2: Baseline creatinine around 1.3. Slightly increased to 1.46 with BUN increased from 12 to 20 over the last 1 week. Likely cause is prerenal in the acute setting of sepsis with a reduced oral intake and fevers.  -cont to monitor -check bmet   # Depression: Stable. At home he takes fluoxetine 10 mg daily. Consult to spiritual care.   # Hyperlipidemia: LDL 06/21/12 129. Will continue with Lipitor 40mg  daily.   Dispo: Disposition is deferred at this time, awaiting improvement of current medical problems.  Anticipated discharge in approximately 3-4 day(s).   The patient does have a current PCP Zada Girt, Richard, MD), therefore will be require OPC follow-up after discharge.   The patient does not have transportation limitations that hinder transportation to clinic appointments.  .Services Needed at time of discharge: Y = Yes, Blank = No PT:   OT:     RN:   Equipment:   Other:     LOS: 5 days   Christen Bame, MD 09/15/2012, 9:22 AM Pgr: [phone]

## 2012-09-16 ENCOUNTER — Encounter (HOSPITAL_COMMUNITY): Payer: Self-pay | Admitting: Orthopedic Surgery

## 2012-09-16 DIAGNOSIS — R197 Diarrhea, unspecified: Secondary | ICD-10-CM

## 2012-09-16 LAB — GLUCOSE, CAPILLARY: Glucose-Capillary: 230 mg/dL — ABNORMAL HIGH (ref 70–99)

## 2012-09-16 LAB — PROTIME-INR: INR: 1.66 — ABNORMAL HIGH (ref 0.00–1.49)

## 2012-09-16 MED ORDER — INSULIN ASPART 100 UNIT/ML ~~LOC~~ SOLN: 5.0000 [IU] | Freq: Three times a day (TID) | SUBCUTANEOUS | Status: DC

## 2012-09-16 MED ORDER — SENNOSIDES-DOCUSATE SODIUM 8.6-50 MG PO TABS: 2.0000 | ORAL_TABLET | Freq: Every evening | ORAL | Status: DC | PRN

## 2012-09-16 MED ORDER — INSULIN GLARGINE 100 UNIT/ML ~~LOC~~ SOLN: 40.0000 [IU] | Freq: Every day | SUBCUTANEOUS | Status: DC

## 2012-09-16 MED ORDER — OXYCODONE HCL 5 MG PO TABS: 5.0000 mg | ORAL_TABLET | ORAL | Status: DC | PRN

## 2012-09-16 NOTE — Progress Notes (Signed)
Physical Therapy Treatment Patient Details Name: [name] MRN: [id number] DOB: [birth year] Today's Date: 09/16/2012 Time: 1478-2956 PT Time Calculation (min): 24 min  PT Assessment / Plan / Recommendation Comments on Treatment Session  Patient doing very well. Highly motivated. Able to complete [place]x without cueing. Given handout. Returned to room to review [facility] transfer with patient. Patient safe with WC parts and use.  Anticipate DC today.     Follow Up Recommendations  Home health PT     Does the patient have the potential to tolerate intense rehabilitation     Barriers to Discharge        Equipment Recommendations  Other (comment)    Recommendations for Other Services    Frequency Min 3X/week   Plan Discharge plan remains appropriate;Frequency remains appropriate    Precautions / Restrictions Precautions Precautions: Fall Restrictions RLE Weight Bearing: Non weight bearing LLE Weight Bearing: Weight bearing as tolerated (on heel)   Pertinent Vitals/Pain Patient states 10/10 but performs activity without complaints    Mobility  Bed Mobility Supine to Sit: 6: Modified independent (Device/Increase time) Sitting - Scoot to Edge of Bed: 6: Modified independent (Device/Increase time) Transfers Sit to Stand: 5: Supervision Stand to Sit: 5: Supervision Squat Pivot Transfers: 5: Supervision;With upper extremity assistance;With armrests Details for Transfer Assistance: cues for safest technique.  Transferred x2 with cues for safety. Patient has WC of his own and is waiting on slide board. Able to squat pivot to WC with S.     Exercises Amputee Exercises Hip ABduction/ADduction: AROM;Right;10 reps Hip Flexion/Marching: AROM;Right;10 reps Knee Flexion: AROM;Right;10 reps Knee Extension: AROM;Right;10 reps Straight Leg Raises: AROM;Right;10 reps   PT Diagnosis:    PT Problem List:   PT Treatment Interventions:     PT Goals Acute Rehab PT Goals PT Goal: Sit to Stand -  Progress: Met PT Goal: Stand to Sit - Progress: Other (comment) PT Transfer Goal: Bed to Chair/Chair to Bed - Progress: Progressing toward goal  Visit Information  Last PT Received On: 09/16/12 Assistance Needed: +1    Subjective Data      Cognition  Cognition Arousal/Alertness: Awake/alert Behavior During Therapy: WFL for tasks assessed/performed Overall Cognitive Status: Within Functional Limits for tasks assessed    Balance     End of Session PT - End of Session Equipment Utilized During Treatment: Gait belt Activity Tolerance: Patient tolerated treatment well Patient left: in chair;with call bell/phone within reach   GP     Fredrich Birks 09/16/2012, 9:20 AM 09/16/2012 Fredrich Birks PTA [phone] pager [phone] office

## 2012-09-16 NOTE — Progress Notes (Signed)
Subjective: Pt doing well this AM. No acute events overnight. Pt afebrile since IV Abx d/c. Having well controlled pain on current oral medication and no IV pain medication supplementation. Was able to participate with PT.   Objective: Vital signs in last 24 hours: Filed Vitals:   09/15/12 0503 09/15/12 1413 09/15/12 2211 09/16/12 0533  BP: 160/89 165/92 167/81 150/87  Pulse: 94 105 96 92  Temp: 98.3 F (36.8 C) 96.8 F (36 C) 98.6 F (37 C) 98.1 F (36.7 C)  TempSrc: Oral Oral Oral   Resp: 18     Height:      Weight: 185 lb 9.6 oz (84.188 kg)   183 lb 9.9 oz (83.289 kg)  SpO2: 100% 100% 100% 100%   Weight change: -1 lb 15.7 oz (-0.899 kg)  Intake/Output Summary (Last 24 hours) at 09/16/12 0823 Last data filed at 09/16/12 0538  Gross per 24 hour  Intake      0 ml  Output   2925 ml  Net  -2925 ml   General: resting in bed, NAD HEENT: PERRL, EOMI, no scleral icterus Cardiac: RRR, no rubs, murmurs or gallops Pulm: clear to auscultation bilaterally, moving normal volumes of air Abd: soft, nontender, nondistended, BS present Ext: warm and well perfused, no pedal edema, rt LE with bandage c/d/i, left foot in gauze c/d/i Neuro: alert and oriented X3, cranial nerves II-XII grossly intact  Lab Results: Basic Metabolic Panel:  Recent Labs Lab 09/13/12 0440 09/13/12 1816  NA 132* 134*  K 3.9 4.0  CL 98 98  CO2 24 25  GLUCOSE 157* 123*  BUN 14 12  CREATININE 1.43* 1.28  CALCIUM 8.5 9.1   CBC:  Recent Labs Lab 09/13/12 0440 09/13/12 1816  WBC 15.1* 10.9*  HGB 7.4* 7.9*  HCT 22.7* 24.6*  MCV 87.3 87.5  PLT 607* 617*   CBG:  Recent Labs Lab 09/14/12 1701 09/14/12 2131 09/15/12 0805 09/15/12 1203 09/15/12 1702 09/15/12 2210  GLUCAP 212* 253* 269* 227* 333* 309*   Coagulation:  Recent Labs Lab 09/13/12 0440 09/14/12 0550 09/15/12 0542 09/16/12 0540  LABPROT 25.8* 25.1* 20.7* 19.1*  INR 2.50* 2.41* 1.85* 1.66*   Urine Drug Screen: Drugs of  Abuse     Component Value Date/Time   LABOPIA NONE DETECTED 07/27/2012 0153   COCAINSCRNUR NONE DETECTED 07/27/2012 0153   COCAINSCRNUR NEG 09/22/2008 2125   LABBENZ NONE DETECTED 07/27/2012 0153   LABBENZ NEG 09/22/2008 2125   AMPHETMU NONE DETECTED 07/27/2012 0153   AMPHETMU NEG 09/22/2008 2125   THCU POSITIVE* 07/27/2012 0153   LABBARB NONE DETECTED 07/27/2012 0153     Micro Results: Recent Results (from the past 240 hour(s))  WOUND CULTURE     Status: None   Collection Time    09/10/12 10:36 PM      Result Value Range Status   Specimen Description WOUND LEFT FOOT   Final   Special Requests NONE   Final   Gram Stain     Final   Value: NO WBC SEEN     NO SQUAMOUS EPITHELIAL CELLS SEEN     ABUNDANT GRAM VARIABLE ROD   Culture FEW PROTEUS MIRABILIS   Final   Report Status 09/13/2012 FINAL   Final   Organism ID, Bacteria PROTEUS MIRABILIS   Final  CULTURE, BLOOD (ROUTINE X 2)     Status: None   Collection Time    09/10/12 10:45 PM      Result Value Range Status  Specimen Description BLOOD ARM RIGHT   Final   Special Requests BOTTLES DRAWN AEROBIC AND ANAEROBIC 10CC   Final   Culture  Setup Time 09/11/2012 04:45   Final   Culture     Final   Value:        BLOOD CULTURE RECEIVED NO GROWTH TO DATE CULTURE WILL BE HELD FOR 5 DAYS BEFORE ISSUING A FINAL NEGATIVE REPORT   Report Status PENDING   Incomplete  CULTURE, BLOOD (ROUTINE X 2)     Status: None   Collection Time    09/10/12 11:00 PM      Result Value Range Status   Specimen Description BLOOD ARM LEFT   Final   Special Requests BOTTLES DRAWN AEROBIC AND ANAEROBIC 10CC   Final   Culture  Setup Time 09/11/2012 04:43   Final   Culture     Final   Value:        BLOOD CULTURE RECEIVED NO GROWTH TO DATE CULTURE WILL BE HELD FOR 5 DAYS BEFORE ISSUING A FINAL NEGATIVE REPORT   Report Status PENDING   Incomplete  SURGICAL PCR SCREEN     Status: None   Collection Time    09/11/12  8:03 PM      Result Value Range Status   MRSA, PCR  NEGATIVE  NEGATIVE Final   Staphylococcus aureus NEGATIVE  NEGATIVE Final   Comment:            The Xpert SA Assay (FDA     approved for NASAL specimens     in patients over 56 years of age),     is one component of     a comprehensive surveillance     program.  Test performance has     been validated by [employer] for patients greater     than or equal to 94 year old.     It is not intended     to diagnose infection nor to     guide or monitor treatment.   Studies/Results: No results found. Medications: I have reviewed the patient's current medications. Scheduled Meds: . atorvastatin  20 mg Oral Daily  . FLUoxetine  10 mg Oral Daily  . gabapentin  300 mg Oral TID  . insulin aspart  0-15 Units Subcutaneous TID WC  . insulin glargine  36 Units Subcutaneous QHS  . lisinopril  40 mg Oral Daily  . pantoprazole  40 mg Oral Daily  . Warfarin - Pharmacist Dosing Inpatient   Does not apply q1800   Continuous Infusions: . sodium chloride 125 mL/hr at 09/12/12 0538  . sodium chloride 20 mL/hr (09/13/12 1651)  . lactated ringers 50 mL/hr at 09/13/12 1347   PRN Meds:.acetaminophen, acetaminophen, calcium carbonate, diazepam, HYDROmorphone (DILAUDID) injection, metoCLOPramide (REGLAN) injection, metoCLOPramide, ondansetron (ZOFRAN) IV, ondansetron, oxyCODONE, oxyCODONE-acetaminophen, polyethylene glycol, senna-docusate, zolpidem Assessment/Plan: #Sepsis 2/2 OM -resolved: Pt initally met SIRS criteria. Currently afebrile and resolved leukocytosis even 24 hrs d/c IV Abx. 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 Lajoyce Corners. Repeat rt foot xray 6/3 some extensive gas pattern and soft tissue infection. Has been compliant with clindamycin. Dr. Laurell Josephs on 6/6 w/o complication. Wound Cx proteus mirabilis. BCx NTD.   - cont warfarin for DVT  prophylaxsis - Acetominophen prn for fever  - Oxycodone IR.  - IVF TKO -Dr. 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 Home Lantus dose was recently increased from 25 u to 32 units. Despite his good compliance with insulin, CBGs above 400 at home.  Urine albumin-to-creatinine ratio elevated at 100.2 on 06/21/2012. Pt has been requiring about 50 U total insulin daily with relatively better control CBGs 2-300s. Pt is concerned about management outpt; will have good follow up and pt has glucometer and lantus at home. Pt doesn't want to use 70/30 as outpt and would like to use lantus and glargine such as similar to inpt management currently.   -Lantus 36 units at bedtime.  -Moderate sliding scale insulin.  -DM educator consulted -Gabapentin and lisinopril   # Hypertension: At home he takes lisinopril 40 mg daily. Currently well controlled.  -cont to monitor  -Lisinopril 40 mg daily.   # Constipation: He reports that he has not had a BM since he was discharged on 5/27 likely in setting of opiate use s/p recent orthopedic surgery. - senna-S  -prn tap water enema  # Chronic Kidney disease stage 2: Baseline creatinine around 1.3. Slightly increased to 1.46 with BUN increased from 12 to 20 over the last 1 week. Likely cause is prerenal in the acute setting of sepsis with a reduced oral intake and fevers.  -cont to monitor -check bmet   # Depression: Stable. At home he takes fluoxetine 10 mg daily. Consult to spiritual care.   # Hyperlipidemia: LDL 06/21/12 129. Will continue with Lipitor 40mg  daily.   Dispo: Disposition is deferred at this time, awaiting improvement of current medical problems.  Anticipated discharge in approximately 1-2 day(s).   The patient does have a current PCP Zada Girt, Richard, MD), therefore will be require OPC follow-up  after discharge.   The patient does not have transportation limitations that hinder transportation to clinic appointments.  .Services Needed at time of discharge: Y = Yes, Blank = No PT:   OT:   RN:   Equipment:   Other:     LOS: 6 days   Christen Bame, MD 09/16/2012, 8:23 AM Pgr: [phone]

## 2012-09-16 NOTE — Progress Notes (Signed)
Visited pt and wife in response to spiritual care consult request. Pt was in the process of being discharged later today. Pt expressed positive attitude and faith in God. He was apologetic to a nurse for a previous incident that happened between them. Pt and wife embraced pt's new challenges. Chaplain shared words of encouragement and provided ministry of empathic listening to their stories. They thanked chaplain for the visit and his encouraging words. Kelle Darting [phone]  09/16/12 1115  Clinical Encounter Type  Visited With Patient and family together  Visit Type Initial;Spiritual support  Referral From Nurse  Consult/Referral To Chaplain  Spiritual Encounters  Spiritual Needs Other (Comment) Chief Strategy Officer of presence)  Stress Factors  Patient Stress Factors Major life changes  Family Stress Factors Major life changes

## 2012-09-16 NOTE — Progress Notes (Signed)
Inpatient Diabetes Program Recommendations  AACE/ADA: New Consensus Statement on Inpatient Glycemic Control (2013)  Target Ranges:  Prepandial:   less than 140 mg/dL      Peak postprandial:   less than 180 mg/dL (1-2 hours)      Critically ill patients:  140 - 180 mg/dL   Reason for Visit: Hyperglycemia  Inpatient Diabetes Program Recommendations Insulin - Basal: Lantus insulin dosage is 36 units at HS Correction (SSI): Receiving Novolog moderate correction  Insulin - Meal Coverage: Request MD order meal coverage-- 6 to 8 units three times daily to be given with each meal in addition to correction scale to be held if patient eats less than 50 % and/or CBG is less than 80 mg/dl.  Note:  Results for GAGE, WEANT (MRN [id number]) as of 09/16/2012 10:40  Ref. Range 09/15/2012 08:05 09/15/2012 12:03 09/15/2012 17:02 09/15/2012 22:10 09/16/2012 08:16  Glucose-Capillary Latest Range: 70-99 mg/dL 657 (H) 846 (H) 962 (H) 309 (H) 230 (H)   Thank you.  Brianna Bennett S. Elsie Lincoln, RN, CNS, CDE Inpatient Diabetes Program, team pager [phone]

## 2012-09-16 NOTE — Progress Notes (Signed)
Visit to patient while in hospital. Will call after patient is discharged to assist with transition of care. 

## 2012-09-16 NOTE — Care Management Note (Signed)
  Page 1 of 1   09/16/2012     11:06:03 AM   CARE MANAGEMENT NOTE 09/16/2012  Patient:  CULLEN, LAHAIE   Account Number:  [id number]  Date Initiated:  09/16/2012  Documentation initiated by:  Ronny Flurry  Subjective/Objective Assessment:     Action/Plan:   Anticipated DC Date:  09/16/2012   Anticipated DC Plan:  HOME W HOME HEALTH SERVICES         Choice offered to / List presented to:  C-1 Patient   DME arranged  BEDSIDE COMMODE      DME agency  [facility]     HH arranged  HH-2 PT      [facility] agency  [facility]   Status of service:  Completed, signed off Medicare Important Message given?   (If response is "NO", the following Medicare IM given date fields will be blank) Date Medicare IM given:   Date Additional Medicare IM given:    Discharge Disposition:    Per UR Regulation:    If discussed at Long Length of Stay Meetings, dates discussed:    Comments:  09-16-12 Spoke with patient and wife at bedside.  Bedside commode and sliding board ordered. Patient's cell not working at present.  Best way for [facility] to call him is to use his wife's cell phone [phone].  Ronny Flurry RN BSN [phone]

## 2012-09-16 NOTE — Progress Notes (Signed)
Patient ID: DIAZ CRAGO, male   DOB: [birth year], 47 y.o.   MRN: [id number] Postoperative day 3 right transtibial amputation. Anticipate discharge to home with home health therapy.

## 2012-09-16 NOTE — Progress Notes (Signed)
Internal Medicine Teaching Service Attending Note Date: 09/16/2012  Patient name: [name]  Medical record number: [id number]  Date of birth: [birth year]    This patient has been seen and discussed with the house staff. Please see their note for complete details. I concur with their findings with the following additions/corrections: Patient to be discvharge home today vs tomorrow. He is good spirits. I answered a lot of questions regarding diabetes control. I believe that based on his hospital CBG's, 40 units would be a correct baseline dose of lantus(he is requiring upto 15 units regular insulin prior to meals). Follow up with Dr. Lajoyce Corners and [place] in clinic.   Lars Mage 09/16/2012, 2:05 PM

## 2012-09-16 NOTE — Discharge Summary (Signed)
Internal Medicine Teaching [facility] Discharge Note  Name: [name] MRN: [id number] DOB: February 05, 1966 47 y.o.  Date of Admission: 09/10/2012  8:39 PM Date of Discharge: 09/16/2012 Attending Physician: Dr. Eben Burow  Discharge Diagnosis: Principal Problem:   Sepsis due to right foot ulcer Active Problems:   Diabetes Type 2, Uncontroled   DIABETIC PERIPHERAL NEUROPATHY   TOBACCO ABUSE   HYPERTENSION   CKD Stage 2   Diabetic foot ulcer   GERD (gastroesophageal reflux disease)   Hyperlipedemia  Discharge Medications:   Medication List    STOP taking these medications       clindamycin 300 MG capsule  Commonly known as:  CLEOCIN      TAKE these medications       ACCU-CHEK FASTCLIX LANCETS Misc  1 each by Does not apply route daily before breakfast.     acetaminophen 500 MG tablet  Commonly known as:  TYLENOL  Take 1,000 mg by mouth daily as needed for pain. In the morning     atorvastatin 20 MG tablet  Commonly known as:  LIPITOR  Take 1 tablet (20 mg total) by mouth daily.     calcium carbonate 500 MG chewable tablet  Commonly known as:  TUMS - dosed in mg elemental calcium  Chew 2 tablets by mouth daily as needed for heartburn.     diphenhydramine-acetaminophen 25-500 MG Tabs  Commonly known as:  TYLENOL PM  Take 1 tablet by mouth at bedtime as needed (sleep/pain).     FLUoxetine 10 MG capsule  Commonly known as:  PROZAC  Take 1 capsule (10 mg total) by mouth daily.     gabapentin 300 MG capsule  Commonly known as:  NEURONTIN  Take 1 capsule (300 mg total) by mouth 3 (three) times daily.     glucose blood test strip  Commonly known as:  ACCU-CHEK SMARTVIEW  Use as instructed     insulin aspart 100 UNIT/ML injection  Commonly known as:  NOVOLOG  Inject 5-8 Units into the skin 3 (three) times daily with meals.     insulin glargine 100 UNIT/ML injection  Commonly known as:  LANTUS  Inject 0.4 mLs (40 Units total) into the skin at bedtime.      lisinopril 20 MG tablet  Commonly known as:  PRINIVIL,ZESTRIL  Take 2 tablets (40 mg total) by mouth daily.     oxyCODONE 5 MG immediate release tablet  Commonly known as:  Oxy IR/ROXICODONE  Take 1 tablet (5 mg total) by mouth every 4 (four) hours as needed.     oxyCODONE-acetaminophen 5-325 MG per tablet  Commonly known as:  PERCOCET/ROXICET  Take 1 tablet by mouth every 4 (four) hours as needed for pain.     pantoprazole 40 MG tablet  Commonly known as:  PROTONIX  Take 1 tablet (40 mg total) by mouth daily.     promethazine 25 MG tablet  Commonly known as:  PHENERGAN  Take 1 tablet (25 mg total) by mouth every 6 (six) hours as needed for nausea.     senna-docusate 8.6-50 MG per tablet  Commonly known as:  Senokot-S  Take 2 tablets by mouth at bedtime as needed.     warfarin 5 MG tablet  Commonly known as:  COUMADIN  Take 1 tablet (5 mg total) by mouth daily.        Disposition and follow-up:   [name] was discharged from [facility] in Stable condition.  At the hospital  follow up visit please address: 1. DM control 2. Home PT 3. HTN control 4. Wound healing of R BKA  Follow-up Appointments: Follow-up Information   Follow up with DUDA,MARCUS V, MD In 2 weeks.   Contact information:   [address] [place] [address] [phone]       Follow up with Bronson Curb, MD. (June 12 at 945 AM)    Contact information:   [address] [place] [address] [phone]      Discharge Orders   Future Orders Complete By Expires     Increase activity slowly  As directed        Consultations:  Dr. Lajoyce Corners of Orthopedics  Procedures Performed:  Dg Chest 2 View  08/28/2012   *RADIOLOGY REPORT*  Clinical Data: Cough, leukocytosis, hypertension, diabetes, history smoking  CHEST - 2 VIEW  Comparison: 08/02/2012  Findings: Normal heart size, mediastinal contours, and pulmonary vascularity. Minimal chronic  peribronchial thickening. No acute infiltrate, pleural effusion, or pneumothorax. No acute osseous findings.  IMPRESSION: Minimal chronic bronchitic changes without acute abnormalities.   Original Report Authenticated By: Ulyses Southward, M.D.   Dg Hand Complete Left  08/28/2012   *RADIOLOGY REPORT*  Clinical Data: Swelling and pain in the region of the thenar eminence.  No trauma.  Diabetic.  LEFT HAND - COMPLETE 3+ VIEW  Comparison: None.  Findings: Mild diffuse soft tissue swelling.  No soft tissue gas. No radio-opaque foreign body.    No acute osseous abnormality.  IMPRESSION: Soft tissue swelling only.   Original Report Authenticated By: Jeronimo Greaves, M.D.   Dg Foot Complete Right  09/10/2012   *RADIOLOGY REPORT*  Clinical Data: Worsening diabetic foot wound. Surgery on 08/31/2012.  RIGHT FOOT COMPLETE - 3+ VIEW  Comparison: 08/30/2012.  Findings: When compared to the prior examination there has been interval resection of the remaining first proximal phalanx and the entire first metatarsal.  Overlying soft tissues are irregular, and there is extensive gas within the soft tissues at the site of resection.  No definite destructive bony lesions are identified at this time.  No acute displaced fracture, subluxation or dislocation is noted.  IMPRESSION: 1.  Postoperative changes related to recent first ray amputation. Extensive gas in the soft tissues in the resection bed may simply be postoperative, however, clinical correlation for signs and symptoms of soft tissue infection is strongly recommended.   Original Report Authenticated By: Trudie Reed, M.D.   Dg Foot Complete Right  08/30/2012   *RADIOLOGY REPORT*  Clinical Data: Right foot infection.  Previous partial amputation of the great toe.  History of diabetes.  RIGHT FOOT COMPLETE - 3+ VIEW  Comparison: Right foot radiographs 04/26/2012.  Findings: Patient is status post amputation through the base of the first proximal phalanx.  There is increased  osseous resorption and erosion at the amputation site.  The first metatarsal appears stable with stable degenerative changes of the first metatarsal phalangeal joint.  The additional toes appear normal.  There is increased soft tissue swelling medial to the first metatarsal phalangeal joint.  IMPRESSION: Increased osseous resorption/erosion of the first proximal phalanx adjacent to the prior amputation suspicious for recurrent osteomyelitis.   Original Report Authenticated By: Carey Bullocks, M.D.   Admission HPI: Mr Mcbain is 47 y.o. male with a PMHx of uncontrolled DM2 with multiple complications (A1c 13.7), CKD 2-3, BL toe amputations followed by bilateral non-healing ulcerations, polysubstance abuse, hypertension, and depression.  He was recently hospitalized (5/21-5/27) for left hand  cellulitis and diabetic foot ulcerations complicated with osteomyelitis.  05/21: Lt hand I&D for deep palmar abscess by Dr Melvyn Novas  05/23: Lt thumb I&D for deep abscess by Dr. Melvyn Novas  05/23: Rt foot x-ray on recurrent osteomyelitis involving the first proximal phalanx by Dr Lajoyce Corners  5/25: Right foot first ray amputations and revision of the left foot transmetatarsal amputation by Dr. Lajoyce Corners.  5/24: Wound (Lt Hand) Cultures - Streptococcus agalactiae. Discharged on PO Clindamycin 300 mg tid X 1 month.  Mr. Chance reports that over the past 4 days he's been experiencing high-grade fevers with 103.9 2 days ago, associated with increasing pain in his right leg with increased drainage from the wound. He can not bare any weight on the right leg due to severe pain- the pain is constant, 8/10. The pain is relieved by resting the leg. He also had 2 episodes of non bloody emesis on the day of admission. He has been having chills, fatigue, without shortness of breath. He reports some dizziness, but has not had any syncopal episode. His appetite has been poor. He was able to fill clindamycin prescription and he has been compliant with it -  also wife confirms this. His left hand wound has improved significantly and he was evaluated at Dr Glenna Durand office on 5/29. He had not seen Dr Lajoyce Corners yet (supposed to see him after 2 weeks POD) and his dressing had been changed since he was discharged.   Hospital Course by problem list: #Sepsis 2/2 OM and ulcers: Pt initally met SIRS criteria and was started on broad spec Abx of Vanc and Zosyn. 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 Lajoyce Corners. Repeat rt foot xray 6/3 some extensive gas pattern and soft tissue infection. Has been compliant with clindamycin. Dr. Laurell Josephs on 6/6 w/o complication. Wound Cx showed proteus mirabilis. BCx NTD. Pt will f/u with Dr. Audrie Lia office for wound management and dressing changes.   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 Home Lantus dose was recently increased from 25 u to 32 units. Despite his good compliance with insulin, CBGs above 400 at home. On admission was 469. Urine albumin-to-creatinine ratio elevated at 100.2 on 06/21/2012. Patient also takes gabapentin 300 mg 3 times a day for diabetic neuropathy. Pt was d/c on lantus 36 U and novolog sliding scale. Pt will need management outpt and titration.   # Hypertension: At home he takes lisinopril 40 mg daily. Pt maintained good normotensive control during admission.     # Constipation: He reports that he has not had a BM since he was discharged on 5/27 likely in setting of opiate use s/p recent orthopedic surgery. Pt improved with and continued on senna-S.;  # Chronic Kidney disease stage 2: Baseline creatinine around 1.3. Pt with very slight increase Cr 1.46 with BUN increased from 12 to 20 over the last 1 week. Likely prerenal in the acute setting of sepsis with a reduced oral intake and fevers  on presentation. This fully resolved to pts baseline by discharge.    # Depression: Stable. At home he takes fluoxetine 10 mg daily. Consult to spiritual care was very helpful for patient.   # Hyperlipidemia: LDL 06/21/12 129. Will continue with Lipitor 40mg  daily.    Discharge Vitals:  BP 148/85  Pulse 108  Temp(Src) 98.1 F (36.7 C) (  Oral)  Resp 18  Ht 5' 8.11" (1.73 m)  Wt 183 lb 9.9 oz (83.289 kg)  BMI 27.83 kg/m2  SpO2 100% General: resting in bed, NAD  HEENT: PERRL, EOMI, no scleral icterus  Cardiac: RRR, no rubs, murmurs or gallops  Pulm: clear to auscultation bilaterally, moving normal volumes of air  Abd: soft, nontender, nondistended, BS present  Ext: warm and well perfused, no pedal edema, rt LE with bandage c/d/i, left foot in gauze c/d/i  Neuro: alert and oriented X3, cranial nerves II-XII grossly intact  Discharge Labs:  No results found for this or any previous visit (from the past 24 hour(s)). CBC    Component Value Date/Time   WBC 10.9* 09/13/2012 1816   RBC 2.81* 09/13/2012 1816   HGB 7.9* 09/13/2012 1816   HCT 24.6* 09/13/2012 1816   PLT 617* 09/13/2012 1816   MCV 87.5 09/13/2012 1816   MCH 28.1 09/13/2012 1816   MCHC 32.1 09/13/2012 1816   RDW 13.6 09/13/2012 1816   LYMPHSABS 1.4 08/28/2012 1405   MONOABS 1.1* 08/28/2012 1405   EOSABS 0.2 08/28/2012 1405   BASOSABS 0.0 08/28/2012 1405    BMET    Component Value Date/Time   NA 134* 09/13/2012 1816   K 4.0 09/13/2012 1816   CL 98 09/13/2012 1816   CO2 25 09/13/2012 1816   GLUCOSE 123* 09/13/2012 1816   BUN 12 09/13/2012 1816   CREATININE 1.28 09/13/2012 1816   CREATININE 1.48* 07/03/2012 1516   CALCIUM 9.1 09/13/2012 1816   GFRNONAA 65* 09/13/2012 1816   GFRAA 76* 09/13/2012 1816    Signed: Christen Bame 09/19/2012, 11:08 AM   Time Spent on Discharge: 40 min Services Ordered on Discharge: HH PT/OT Equipment Ordered on Discharge: wheelchair, commode, transfer board

## 2012-09-17 LAB — CULTURE, BLOOD (ROUTINE X 2): Culture: NO GROWTH

## 2012-09-18 ENCOUNTER — Telehealth: Payer: Self-pay | Admitting: Dietician

## 2012-09-18 NOTE — Telephone Encounter (Addendum)
Discharge date:09-16-12 Call date: 09-18-12 Hospital follow up appointment date: 09/19/12 9:45am  Calling to assist with transition of care from hospital to home. Unable to reach patient by phone. Will follow up at his appointment today

## 2012-09-19 ENCOUNTER — Ambulatory Visit: Payer: Medicaid Other | Admitting: Internal Medicine

## 2012-09-27 ENCOUNTER — Encounter (HOSPITAL_BASED_OUTPATIENT_CLINIC_OR_DEPARTMENT_OTHER): Payer: Medicaid Other

## 2012-10-09 ENCOUNTER — Encounter: Payer: Self-pay | Admitting: Internal Medicine

## 2012-10-09 ENCOUNTER — Ambulatory Visit (INDEPENDENT_AMBULATORY_CARE_PROVIDER_SITE_OTHER): Payer: Medicaid Other | Admitting: Internal Medicine

## 2012-10-09 VITALS — BP 137/89 | HR 107 | Temp 98.0°F | Ht 68.0 in | Wt 188.0 lb

## 2012-10-09 DIAGNOSIS — E785 Hyperlipidemia, unspecified: Secondary | ICD-10-CM

## 2012-10-09 DIAGNOSIS — S88119A Complete traumatic amputation at level between knee and ankle, unspecified lower leg, initial encounter: Secondary | ICD-10-CM

## 2012-10-09 DIAGNOSIS — Z89511 Acquired absence of right leg below knee: Secondary | ICD-10-CM

## 2012-10-09 DIAGNOSIS — F329 Major depressive disorder, single episode, unspecified: Secondary | ICD-10-CM

## 2012-10-09 DIAGNOSIS — Z89519 Acquired absence of unspecified leg below knee: Secondary | ICD-10-CM | POA: Insufficient documentation

## 2012-10-09 DIAGNOSIS — I10 Essential (primary) hypertension: Secondary | ICD-10-CM

## 2012-10-09 DIAGNOSIS — E1149 Type 2 diabetes mellitus with other diabetic neurological complication: Secondary | ICD-10-CM

## 2012-10-09 DIAGNOSIS — E11621 Type 2 diabetes mellitus with foot ulcer: Secondary | ICD-10-CM

## 2012-10-09 DIAGNOSIS — K219 Gastro-esophageal reflux disease without esophagitis: Secondary | ICD-10-CM

## 2012-10-09 DIAGNOSIS — E1159 Type 2 diabetes mellitus with other circulatory complications: Secondary | ICD-10-CM

## 2012-10-09 HISTORY — DX: Acquired absence of unspecified leg below knee: Z89.519

## 2012-10-09 LAB — GLUCOSE, CAPILLARY: Glucose-Capillary: 135 mg/dL — ABNORMAL HIGH (ref 70–99)

## 2012-10-09 MED ORDER — GABAPENTIN 300 MG PO CAPS: 300.0000 mg | ORAL_CAPSULE | Freq: Three times a day (TID) | ORAL | Status: DC

## 2012-10-09 MED ORDER — FLUOXETINE HCL 10 MG PO CAPS: 10.0000 mg | ORAL_CAPSULE | Freq: Every day | ORAL | Status: DC

## 2012-10-09 MED ORDER — ASPIRIN EC 81 MG PO TBEC: 81.0000 mg | DELAYED_RELEASE_TABLET | Freq: Every day | ORAL | Status: DC

## 2012-10-09 MED ORDER — PANTOPRAZOLE SODIUM 40 MG PO TBEC
40.0000 mg | DELAYED_RELEASE_TABLET | Freq: Every day | ORAL | Status: DC
Start: 2012-10-09 — End: 2012-11-13

## 2012-10-09 MED ORDER — OXYCODONE-ACETAMINOPHEN 5-325 MG PO TABS: 1.0000 | ORAL_TABLET | ORAL | Status: DC | PRN

## 2012-10-09 MED ORDER — ATORVASTATIN CALCIUM 20 MG PO TABS: 20.0000 mg | ORAL_TABLET | Freq: Every day | ORAL | Status: DC

## 2012-10-09 MED ORDER — LISINOPRIL 20 MG PO TABS: 40.0000 mg | ORAL_TABLET | Freq: Every day | ORAL | Status: DC

## 2012-10-09 NOTE — Patient Instructions (Addendum)
Please start taking Aspirin  Please you need to go to wound care center Please come back in 1 month Please take your medications as prescribed

## 2012-10-09 NOTE — Assessment & Plan Note (Addendum)
Doing well with stump healing nicely but still complains of pain. No signs of infection. Will follow up with Dr Lajoyce Corners on 10/30/2012.   Plan  - will prescribe 120 pills of percocet for the pain  - I have referred him to outpatient wound care center  - started on ASA 81 mg daily. Patient has not been taking Coumadin due to S/E.  - I have encouraged him to keep his appointment with Dr Lajoyce Corners. - he is being evaluated for a prosthetic leg

## 2012-10-09 NOTE — Progress Notes (Signed)
Patient ID: TRAFTON ROKER, male   DOB: June 08, 1965, 47 y.o.   MRN: [id number] Subjective:   Patient ID: JATAVION PEASTER male   DOB: [birth year] 47 y.o.   MRN: [id number]  HPI: Mr.Deshone B Kobus is a 47 y.o. past medical history of uncontrolled type 2 diabetes, status post right below knee amputation on 09/13/2012, hypertension, hyperlipidemia, and depression, presents for a hospital followup visit. He complains of severe pain in his surgical stump. He is undergoing evaluation for a prosthetic.  Patient reports to be feeling well since surgery. He denies any fevers, chills, nausea, or vomiting. He requests for refills on number of his medications. Patient reports that he has sweating associated with Coumadin and he wonders whether he absolutely needs to be on this medication. He has not been taking it since was discharged due to this side effect.  Please see the A&P for the status of the pt's chronic medical problems.   Past Medical History  Diagnosis Date  . Onychomycosis   . Hypertension   . Diabetic peripheral neuropathy   . Diabetic foot ulcer     s/p Right first ray amputation, left transmetatarsal amputation with revision  . History of drug abuse     Cocaine and marijuana  . Exposure to trichomonas     treated empirically  . Cellulitis of left foot 03/2008    left 4th and 5th metatarsal area  . Hyperlipidemia   . 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 Ulcerative esophagitis 2007    severe, complicated with UGI bleed  . Mallory Janann August tear April 2009  . History of prolonged Q-T interval on ECG   . 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  . 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 CKD (chronic kidney disease) stage 2, GFR 60-89 ml/min     BL SCr 1.3-1.4  . Depression   . Peripheral vascular disease   . Type 2 diabetes mellitus, uncontrolled, with  renal complications   . GERD (gastroesophageal reflux disease)   . 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 ACCU-CHEK FASTCLIX LANCETS MISC 1 each by Does not apply route daily before breakfast.  102 each  2  . aspirin EC 81 MG tablet Take 1 tablet (81 mg total) by mouth daily.  150 tablet  2  . atorvastatin (LIPITOR) 20 MG tablet Take 1 tablet (20 mg total) by mouth daily.  30 tablet  0  . calcium carbonate (TUMS - DOSED IN MG ELEMENTAL CALCIUM) 500 MG chewable tablet Chew 2 tablets by mouth daily as needed for heartburn.      Marland Kitchen FLUoxetine (PROZAC) 10 MG capsule Take 1 capsule (10 mg total) by mouth daily.  90 capsule  0  . gabapentin (NEURONTIN) 300 MG capsule Take 1 capsule (300 mg total) by mouth 3 (three) times daily.  90 capsule  2  . glucose blood (ACCU-CHEK SMARTVIEW) test strip Use as instructed  100 each  12  . insulin aspart (NOVOLOG) 100 UNIT/ML injection Inject 5-8 Units into the skin 3 (three) times daily with meals.  1 vial  12  . insulin glargine (LANTUS) 100 UNIT/ML injection Inject 0.4 mLs (40 Units total) into the skin at bedtime.  10 mL  12  . lisinopril (  PRINIVIL,ZESTRIL) 20 MG tablet Take 2 tablets (40 mg total) by mouth daily.  90 tablet  0  . oxyCODONE-acetaminophen (PERCOCET/ROXICET) 5-325 MG per tablet Take 1 tablet by mouth every 4 (four) hours as needed for pain.  120 tablet  0  . pantoprazole (PROTONIX) 40 MG tablet Take 1 tablet (40 mg total) by mouth daily.  90 tablet  11  . acetaminophen (TYLENOL) 500 MG tablet Take 1,000 mg by mouth daily as needed for pain. In the morning      . diphenhydramine-acetaminophen (TYLENOL PM) 25-500 MG TABS Take 1 tablet by mouth at bedtime as needed (sleep/pain).       No current facility-administered medications for this visit.   Family History  Problem Relation Age of Onset  . Diabetes type II Mother   . Pancreatic cancer  Father    History   Social History  . Marital Status: Married    Spouse Name: N/A    Number of Children: N/A  . Years of Education: N/A   Social History Main Topics  . Smoking status: Current Some Day Smoker -- 0.50 packs/day for 34 years    Types: Cigarettes  . Smokeless tobacco: Never Used     Comment: Has stopped 2 weeks ago- QUIT line info given to pt and wife for asst with smoking patchers, etc  . Alcohol Use: Yes     Comment: 12/05/2011 "occasionally; maybe New Years"  . Drug Use: Yes    Special: Marijuana     Comment: 12/05/2011 "last time ~ 2 wk ago"  . Sexually Active: Yes   Other Topics Concern  . None   Social History Narrative  . None    Review of Systems: Constitutional: Denies fever, chills, diaphoresis, appetite change and fatigue.  HEENT: Denies photophobia, eye pain, redness, hearing loss, ear pain, congestion, sore throat, rhinorrhea, sneezing, mouth sores, trouble swallowing, neck pain, neck stiffness and tinnitus.  Respiratory: Denies SOB, DOE, cough, chest tightness, and wheezing.  Cardiovascular: Denies chest pain, palpitations and leg swelling.  Gastrointestinal: Denies nausea, vomiting, abdominal pain, diarrhea, constipation,blood in stool and abdominal distention.  Genitourinary: Denies dysuria, urgency, frequency, hematuria, flank pain and difficulty urinating. Neurological: Denies dizziness, seizures, syncope, weakness, lightheadedness, numbness and headaches.  Hematological: Denies adenopathy. Easy bruising, personal or family bleeding history  Psychiatric/Behavioral: Denies suicidal ideation, mood changes, confusion, nervousness, sleep disturbance and agitation  Objective:  Physical Exam: Blood pressure 137/89, pulse of 108, 100% on room air, temperature 98.0. General: Alert, oriented to situation, year, month, date, and location. In a wheelchair. Wife in exam room  Lungs: Clear bilaterally Heart: Regular; no extra sounds or murmurs  Abdomen:  Bowel sounds present, soft, nontender; no hepatosplenomegaly  Extremities: Left foot ulcer looks much better. Dressings over the surgical stump appeared dry and clean.  Neurologic: Alert and oriented x3  Assessment & Plan:  I have discussed my assessment, and plan with Dr. Rogelia Boga, as detailed in my problem based charting.  I have refilled his medications, including prescription of Percocet, 120 pills. Started him on aspirin 81 mg daily. I have referred him to the wound care center. He will see Dr. Lajoyce Corners in 10/30/2012. He will followup with me in one month.

## 2012-10-09 NOTE — Assessment & Plan Note (Signed)
Lab Results  Component Value Date   HGBA1C 8.5 10/09/2012   HGBA1C 13.7 06/21/2012   HGBA1C 13.1* 02/18/2012     Assessment: Diabetes control: fair control Progress toward A1C goal:  improved Comments: Reports that his pre-dinner CBGs tend to be higher compared to AM numbers. He also reports that lantus of 40 units tends to give high hypoglycemia episodes.   Plan: Medications:  continue current medications Home glucose monitoring: Frequency: 3 times a day Timing: before breakfast;before lunch;before dinner Instruction/counseling given: reminded to bring blood glucose meter & log to each visit Educational resources provided:   Self management tools provided:   Other plans: Will need to address his insulin more closely when come back. Keep lantus at 32 units.

## 2012-10-09 NOTE — Assessment & Plan Note (Signed)
BP Readings from Last 3 Encounters:  10/09/12 137/89  09/16/12 148/85  09/16/12 148/85    Lab Results  Component Value Date   NA 134* 09/13/2012   K 4.0 09/13/2012   CREATININE 1.28 09/13/2012    Assessment: Blood pressure control: controlled Progress toward BP goal:  improved Comments: refilled Lisinopril   Plan: Medications:  continue current medications Educational resources provided:   Self management tools provided:   Other plans: none

## 2012-10-10 NOTE — Progress Notes (Signed)
Case discussed with Dr. Kazibwe soon after the resident saw the patient.  We reviewed the resident's history and exam and pertinent patient test results.  I agree with the assessment, diagnosis, and plan of care documented in the resident's note. 

## 2012-10-17 ENCOUNTER — Other Ambulatory Visit: Payer: Self-pay

## 2012-10-18 ENCOUNTER — Encounter: Payer: Self-pay | Admitting: Licensed Clinical Social Worker

## 2012-10-23 ENCOUNTER — Encounter (HOSPITAL_BASED_OUTPATIENT_CLINIC_OR_DEPARTMENT_OTHER): Payer: Medicaid Other | Attending: General Surgery

## 2012-10-23 DIAGNOSIS — L97509 Non-pressure chronic ulcer of other part of unspecified foot with unspecified severity: Secondary | ICD-10-CM | POA: Insufficient documentation

## 2012-10-23 DIAGNOSIS — E1169 Type 2 diabetes mellitus with other specified complication: Secondary | ICD-10-CM | POA: Insufficient documentation

## 2012-10-23 DIAGNOSIS — T8789 Other complications of amputation stump: Secondary | ICD-10-CM | POA: Insufficient documentation

## 2012-10-23 DIAGNOSIS — S88119A Complete traumatic amputation at level between knee and ankle, unspecified lower leg, initial encounter: Secondary | ICD-10-CM | POA: Insufficient documentation

## 2012-10-23 DIAGNOSIS — Y835 Amputation of limb(s) as the cause of abnormal reaction of the patient, or of later complication, without mention of misadventure at the time of the procedure: Secondary | ICD-10-CM | POA: Insufficient documentation

## 2012-11-08 ENCOUNTER — Encounter (HOSPITAL_BASED_OUTPATIENT_CLINIC_OR_DEPARTMENT_OTHER): Payer: Medicaid Other | Attending: General Surgery

## 2012-11-08 DIAGNOSIS — S88119A Complete traumatic amputation at level between knee and ankle, unspecified lower leg, initial encounter: Secondary | ICD-10-CM | POA: Insufficient documentation

## 2012-11-08 DIAGNOSIS — Y835 Amputation of limb(s) as the cause of abnormal reaction of the patient, or of later complication, without mention of misadventure at the time of the procedure: Secondary | ICD-10-CM | POA: Insufficient documentation

## 2012-11-08 DIAGNOSIS — T8789 Other complications of amputation stump: Secondary | ICD-10-CM | POA: Insufficient documentation

## 2012-11-08 DIAGNOSIS — E119 Type 2 diabetes mellitus without complications: Secondary | ICD-10-CM | POA: Insufficient documentation

## 2012-11-13 ENCOUNTER — Encounter: Payer: Self-pay | Admitting: Internal Medicine

## 2012-11-13 ENCOUNTER — Ambulatory Visit (INDEPENDENT_AMBULATORY_CARE_PROVIDER_SITE_OTHER): Payer: Medicaid Other | Admitting: Internal Medicine

## 2012-11-13 VITALS — BP 135/79 | HR 95 | Temp 97.4°F | Ht 68.0 in | Wt 204.3 lb

## 2012-11-13 DIAGNOSIS — K219 Gastro-esophageal reflux disease without esophagitis: Secondary | ICD-10-CM

## 2012-11-13 DIAGNOSIS — N182 Chronic kidney disease, stage 2 (mild): Secondary | ICD-10-CM

## 2012-11-13 DIAGNOSIS — E1159 Type 2 diabetes mellitus with other circulatory complications: Secondary | ICD-10-CM

## 2012-11-13 DIAGNOSIS — I10 Essential (primary) hypertension: Secondary | ICD-10-CM

## 2012-11-13 DIAGNOSIS — E1149 Type 2 diabetes mellitus with other diabetic neurological complication: Secondary | ICD-10-CM

## 2012-11-13 DIAGNOSIS — E785 Hyperlipidemia, unspecified: Secondary | ICD-10-CM

## 2012-11-13 DIAGNOSIS — S88119A Complete traumatic amputation at level between knee and ankle, unspecified lower leg, initial encounter: Secondary | ICD-10-CM

## 2012-11-13 DIAGNOSIS — Z89511 Acquired absence of right leg below knee: Secondary | ICD-10-CM

## 2012-11-13 LAB — GLUCOSE, CAPILLARY: Glucose-Capillary: 258 mg/dL — ABNORMAL HIGH (ref 70–99)

## 2012-11-13 LAB — LIPID PANEL
HDL: 67 mg/dL (ref >39)
Triglycerides: 61 mg/dL (ref <150)

## 2012-11-13 MED ORDER — INSULIN GLARGINE 100 UNIT/ML ~~LOC~~ SOLN: 36.0000 [IU] | Freq: Every day | SUBCUTANEOUS | Status: DC

## 2012-11-13 MED ORDER — PANTOPRAZOLE SODIUM 40 MG PO TBEC: 40.0000 mg | DELAYED_RELEASE_TABLET | Freq: Every day | ORAL | Status: DC

## 2012-11-13 MED ORDER — LISINOPRIL 20 MG PO TABS: 40.0000 mg | ORAL_TABLET | Freq: Every day | ORAL | Status: DC

## 2012-11-13 MED ORDER — GABAPENTIN 300 MG PO CAPS: 300.0000 mg | ORAL_CAPSULE | Freq: Three times a day (TID) | ORAL | Status: DC

## 2012-11-13 MED ORDER — INSULIN ASPART 100 UNIT/ML ~~LOC~~ SOLN: 4.0000 [IU] | Freq: Three times a day (TID) | SUBCUTANEOUS | Status: DC

## 2012-11-13 NOTE — Assessment & Plan Note (Signed)
Continue follow up with orthopedic surgeon, Dr Lajoyce Corners and the wound center.

## 2012-11-13 NOTE — Assessment & Plan Note (Signed)
Will check his lipid profile today and determine if we need to go up on his Lipitor. Currently taking 20 mg daily.

## 2012-11-13 NOTE — Assessment & Plan Note (Signed)
BP Readings from Last 3 Encounters:  11/13/12 135/79  10/09/12 137/89  09/16/12 148/85    Lab Results  Component Value Date   NA 134* 09/13/2012   K 4.0 09/13/2012   CREATININE 1.28 09/13/2012    Assessment: Blood pressure control: controlled Progress toward BP goal:  at goal Comments:   Plan: Medications:  continue current medications Educational resources provided:   Self management tools provided:   Other plans:

## 2012-11-13 NOTE — Patient Instructions (Addendum)
Please increase Lantus to 36 units at bed time  Please reduce Novolog insulin to 4-6 units three times daily with meals We will check your blood for cholesterol today Please come back in one month.  Treatment Goals:  Goals (1 Years of Data) as of 11/13/12         As of Today 10/09/12 09/16/12 09/16/12 09/16/12     Blood Pressure    . Blood Pressure < 130/80  135/79 137/89 148/85 163/84 150/87    . Blood Pressure < 140/90  135/79 137/89 148/85 163/84 150/87     Lifestyle    . Quit smoking / using tobacco           Result Component    . HEMOGLOBIN A1C < 8.0   8.5       . LDL CALC < 100          . LDL CALC < 100            Progress Toward Treatment Goals:  Treatment Goal 11/13/2012  Hemoglobin A1C unchanged  Blood pressure at goal  Stop smoking -    Self Care Goals & Plans:  Self Care Goal 07/03/2012  Manage my medications take my medicines as prescribed  Monitor my health keep track of my blood glucose; bring my blood pressure log to each visit; bring my glucose meter and log to each visit  Be physically active find an activity I enjoy    Home Blood Glucose Monitoring 11/13/2012  Check my blood sugar 3 times a day  When to check my blood sugar before breakfast; before lunch; before dinner     Care Management & Community Referrals:  Referral 11/13/2012  Referrals made for care management support -  Referrals made to community resources smoking cessation          Diabetes Meal Planning Guide The diabetes meal planning guide is a tool to help you plan your meals and snacks. It is important for people with diabetes to manage their blood glucose (sugar) levels. Choosing the right foods and the right amounts throughout your day will help control your blood glucose. Eating right can even help you improve your blood pressure and reach or maintain a healthy weight. CARBOHYDRATE COUNTING MADE EASY When you eat carbohydrates, they turn to sugar. This raises your blood glucose level.  Counting carbohydrates can help you control this level so you feel better. When you plan your meals by counting carbohydrates, you can have more flexibility in what you eat and balance your medicine with your food intake. Carbohydrate counting simply means adding up the total amount of carbohydrate grams in your meals and snacks. Try to eat about the same amount at each meal. Foods with carbohydrates are listed below. Each portion below is 1 carbohydrate serving or 15 grams of carbohydrates. Ask your dietician how many grams of ca rbohydrates you should eat at each meal or snack. Grains and Starches  1 slice bread.   English muffin or hotdog/hamburger bun.   cup cold cereal (unsweetened).   cup cooked pasta or rice.   cup starchy vegetables (corn, potatoes, peas, beans, winter squash).  1 tortilla (6 inches).   bagel.  1 waffle or pancake (size of a CD).   cup cooked cereal.  4 to 6 small crackers. *Whole grain is recommended. Fruit  1 cup fresh unsweetened berries, melon, papaya, pineapple.  1 small fresh fruit.   banana or mango.   cup fruit juice (4 oz unsweetened).  cup canned fruit in natural juice or water.  2 tbs dried fruit.  12 to 15 grapes or cherries. Milk and Yogurt  1 cup fat-free or 1% milk.  1 cup soy milk.  6 oz light yogurt with sugar-free sweetener.  6 oz low-fat soy yogurt.  6 oz plain yogurt. Vegetables  1 cup raw or  cup cooked is counted as 0 carbohydrates or a "free" food.  If you eat 3 or more servings at 1 meal, count them as 1 carbohydrate serving. Other Carbohydrates   oz chips or pretzels.   cup ice cream or frozen yogurt.   cup sherbet or sorbet.  2 inch square cake, no frosting.  1 tbs honey, sugar, jam, jelly, or syrup.  2 small cookies.  3 squares of graham crackers.  3 cups popcorn.  6 crackers.  1 cup broth-based soup.  Count 1 cup casserole or other mixed foods as 2 carbohydrate  servings.  Foods with less than 20 calories in a serving may be counted as 0 carbohydrates or a "free" food. You may want to purchase a book or computer software that lists the carbohydrate gram counts of different foods. In addition, the nutrition facts panel on the labels of the foods you eat are a good source of this information. The label will tell you how big the serving size is and the total number of carbohydrate grams you will be eating per serving. Divide this number by 15 to obtain the number of carbohydrate servings in a portion. Remember, 1 carbohydrate serving equals 15 grams of carbohydrate. SERVING SIZES Measuring foods and serving sizes helps you make sure you are getting the right amount of food. The list below tells how big or small some common serving sizes are.  1 oz.........4 stacked dice.  3 oz........Marland KitchenDeck of cards.  1 tsp.......Marland KitchenTip of little finger.  1 tbs......Marland KitchenMarland KitchenThumb.  2 tbs.......Marland KitchenGolf ball.   cup......Marland KitchenHalf of a fist.  1 cup.......Marland KitchenA fist. SAMPLE DIABETES MEAL PLAN Below is a sample meal plan that includes foods from the grain and starches, dairy, vegetable, fruit, and meat groups. A dietician can individualize a meal plan to fit your calorie needs and tell you the number of servings needed from each food group. However, controlling the total amount of carbohydrates in your meal or snack is more important than making sure you include all of the food groups at every meal. You may interchange carbohydrate containing foods (dairy, starches, and fruits). The meal plan below is an example of a 2000 calorie diet using carbohydrate counting. This meal plan has 17 carbohydrate servings. Breakfast  1 cup oatmeal (2 carb servings).   cup light yogurt (1 carb serving).  1 cup blueberries (1 carb serving).   cup almonds. Snack  1 large apple (2 carb servings).  1 low-fat string cheese stick. Lunch  Chicken breast salad.  1 cup spinach.   cup chopped  tomatoes.  2 oz chicken breast, sliced.  2 tbs low-fat [place] dressing.  12 whole-wheat crackers (2 carb servings).  12 to 15 grapes (1 carb serving).  1 cup low-fat milk (1 carb serving). Snack  1 cup carrots.   cup hummus (1 carb serving). Dinner  3 oz broiled salmon.  1 cup brown rice (3 carb servings). Snack  1  cups steamed broccoli (1 carb serving) drizzled with 1 tsp olive oil and lemon juice.  1 cup light pudding (2 carb servings). DIABETES MEAL PLANNING WORKSHEET Your dietician can use this worksheet to help  you decide how many servings of foods and what types of foods are right for you.  BREAKFAST Food Group and Servings / Carb Servings Grain/Starches __________________________________ Dairy __________________________________________ Vegetable ______________________________________ Fruit ___________________________________________ Meat __________________________________________ Fat ____________________________________________ LUNCH Food Group and Servings / Carb Servings Grain/Starches ___________________________________ Dairy ___________________________________________ Fruit ____________________________________________ Meat ___________________________________________ Fat _____________________________________________ Laural [place] Food Group and Servings / Carb Servings Grain/Starches ___________________________________ Dairy ___________________________________________ Fruit ____________________________________________ Meat ___________________________________________ Fat _____________________________________________ SNACKS Food Group and Servings / Carb Servings Grain/Starches ___________________________________ Dairy ___________________________________________ Vegetable _______________________________________ Fruit ____________________________________________ Meat ___________________________________________ Fat  _____________________________________________ DAILY TOTALS Starches _________________________ Vegetable ________________________ Fruit ____________________________ Dairy ____________________________ Meat ____________________________ Fat ______________________________ Document Released: 12/22/2004 Document Revised: 06/19/2011 Document Reviewed: 11/02/2008 [employer] Patient Information 2014 [employer]

## 2012-11-13 NOTE — Progress Notes (Signed)
Subjective:   Patient ID: [name] male   DOB: 11/25/1965 47 y.o.   MRN: [id number]  HPI: [name] is a 47 y.o. 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   He has no complaints today. He brings his glucose meter. He denies episodes of sweating, shaking, or loss of consciousness recently. However, he reports that he has noticed some low readings on his glucose meter in the 70s a few times in a week together with CBG of up to 600 a few times a week. Review of his meter shows that these are usually postprandial likely happening after short acting insulin.  He continues to attend the wound Center and following up with Dr.Duda. Both his wounds are healing nicely.  Please see the A&P for the status of the pt's chronic medical problems.   Past Medical History  Diagnosis Date  . Onychomycosis   . Hypertension   . Diabetic peripheral neuropathy   . Diabetic foot ulcer     s/p Right first ray amputation, left transmetatarsal amputation with revision  . History of drug abuse     Cocaine and marijuana  . Exposure to trichomonas     treated empirically  . Cellulitis of left foot 03/2008    left 4th and 5th metatarsal area  . Hyperlipidemia   . 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 Ulcerative esophagitis 2007    severe, complicated with UGI bleed  . Mallory Janann August tear April 2009  . History of prolonged Q-T interval on ECG   . 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  . 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 CKD (chronic kidney disease) stage 2, GFR 60-89 ml/min     BL SCr 1.3-1.4  . Depression   . Peripheral vascular disease   . Type 2 diabetes mellitus, uncontrolled, with renal complications    . GERD (gastroesophageal reflux disease)   . 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 ACCU-CHEK FASTCLIX LANCETS MISC 1 each by Does not apply route daily before breakfast.  102 each  2  . aspirin EC 81 MG tablet Take 1 tablet (81 mg total) by mouth daily.  150 tablet  2  . atorvastatin (LIPITOR) 20 MG tablet Take 1 tablet (20 mg total) by mouth daily.  30 tablet  0  . calcium carbonate (TUMS - DOSED IN MG ELEMENTAL CALCIUM) 500 MG chewable tablet Chew 2 tablets by mouth daily as needed for heartburn.      Marland Kitchen FLUoxetine (PROZAC) 10 MG capsule Take 1 capsule (10 mg total) by mouth daily.  90 capsule  0  . gabapentin (NEURONTIN) 300 MG capsule Take 1 capsule (300 mg total) by mouth 3 (three) times daily.  90 capsule  2  . glucose blood (ACCU-CHEK SMARTVIEW) test strip Use as instructed  100 each  12  . insulin aspart (NOVOLOG) 100 UNIT/ML injection Inject 4-6 Units into the skin 3 (three) times daily with meals.  1 vial  12  . insulin glargine (LANTUS) 100 UNIT/ML injection Inject 0.36 mLs (36 Units total) into the skin at bedtime.  10 mL  12  . lisinopril (  PRINIVIL,ZESTRIL) 20 MG tablet Take 2 tablets (40 mg total) by mouth daily.  90 tablet  0  . pantoprazole (PROTONIX) 40 MG tablet Take 1 tablet (40 mg total) by mouth daily.  90 tablet  11   No current facility-administered medications for this visit.   Family History  Problem Relation Age of Onset  . Diabetes type II Mother   . Pancreatic cancer Father    History   Social History  . Marital Status: Married    Spouse Name: N/A    Number of Children: N/A  . Years of Education: N/A   Social History Main Topics  . Smoking status: Current Some Day Smoker -- 0.50 packs/day for 34 years    Types: Cigarettes  . Smokeless tobacco: Never Used     Comment: Has stopped 2 weeks ago- QUIT line info given to pt and wife for asst with smoking  patchers, etc  . Alcohol Use: Yes     Comment: 12/05/2011 "occasionally; maybe New Years"  . Drug Use: Yes    Special: Marijuana     Comment: 12/05/2011 "last time ~ 2 wk ago"  . Sexually Active: Yes   Other Topics Concern  . None   Social History Narrative  . None    Review of Systems: Constitutional: Denies fever, chills, diaphoresis, appetite change and fatigue.  HEENT: Denies photophobia, eye pain, redness, hearing loss, ear pain, congestion, sore throat, rhinorrhea, sneezing, mouth sores, trouble swallowing, neck pain, neck stiffness and tinnitus.  Respiratory: Denies SOB, DOE, cough, chest tightness, and wheezing.  Cardiovascular: Denies chest pain, palpitations and leg swelling.  Gastrointestinal: Denies nausea, vomiting, abdominal pain, diarrhea, constipation,blood in stool and abdominal distention.  Psychiatric/Behavioral: Denies suicidal ideation, mood changes, confusion, nervousness, sleep disturbance and agitation  Objective:  Physical Exam: Filed Vitals:   11/13/12 1354  BP: 135/79  Pulse: 95  Temp: 97.4 F (36.3 C)   General: Alert, oriented to situation, year, month, date, and location.  Lungs: Clear bilaterally Heart: Regular; no extra sounds or murmurs  Abdomen: Bowel sounds present, soft, nontender; no hepatosplenomegaly  Extremities: Left foot ulcer looks much better. Dressings over the surgical stump appeared dry and clean.  Neurologic: Alert and oriented x3  Assessment & Plan:  I have discussed my assessment, and plan with Dr. Criselda Peaches, as detailed in my problem based charting.

## 2012-11-13 NOTE — Assessment & Plan Note (Addendum)
Lab Results  Component Value Date   HGBA1C 8.5 10/09/2012   HGBA1C 13.7 06/21/2012   HGBA1C 13.1* 02/18/2012     Assessment: Diabetes control: fair control Progress toward A1C goal:  unchanged Comments: Tenuous blood sugar control with both hyper and hypoglycemia. The timing of hypoglycemia seems to coincide with postprandial readings likely following his short acting insulin shots.  Plan: Medications:  I have decreased the dose of his NovoLog from 5-8 units before to 4-6 units 3 times a day with meals. I have increased his Lantus from 32 units at bedtime to 36 units. Home glucose monitoring: Frequency: 3 times a day Timing: before breakfast;before lunch;before dinner Instruction/counseling given: reminded to get eye exam, discussed the need for weight loss and discussed diet Educational resources provided:   Self management tools provided:   Other plans: This patient might benefit from Januvia which has been shown to be beneficial in patients with tenuous CBGs. It has been difficult to get [name] do diabetic eye exam due to his Medicaid insurance status.

## 2012-11-22 NOTE — Progress Notes (Signed)
Case discussed with Dr.Kazibwe at the time of the visit.  We reviewed the resident's history and exam and pertinent patient test results.  I agree with the assessment, diagnosis, and plan of care documented in the resident's note.    

## 2012-11-28 ENCOUNTER — Encounter: Payer: Self-pay | Admitting: Internal Medicine

## 2012-12-13 ENCOUNTER — Encounter (HOSPITAL_BASED_OUTPATIENT_CLINIC_OR_DEPARTMENT_OTHER): Payer: Medicaid Other | Attending: General Surgery

## 2012-12-13 DIAGNOSIS — E1169 Type 2 diabetes mellitus with other specified complication: Secondary | ICD-10-CM | POA: Insufficient documentation

## 2012-12-13 DIAGNOSIS — T8189XA Other complications of procedures, not elsewhere classified, initial encounter: Secondary | ICD-10-CM | POA: Insufficient documentation

## 2012-12-13 DIAGNOSIS — L97809 Non-pressure chronic ulcer of other part of unspecified lower leg with unspecified severity: Secondary | ICD-10-CM | POA: Insufficient documentation

## 2012-12-13 DIAGNOSIS — Y929 Unspecified place or not applicable: Secondary | ICD-10-CM | POA: Insufficient documentation

## 2012-12-13 DIAGNOSIS — Y835 Amputation of limb(s) as the cause of abnormal reaction of the patient, or of later complication, without mention of misadventure at the time of the procedure: Secondary | ICD-10-CM | POA: Insufficient documentation

## 2012-12-20 DIAGNOSIS — E1169 Type 2 diabetes mellitus with other specified complication: Secondary | ICD-10-CM | POA: Diagnosis not present

## 2012-12-20 DIAGNOSIS — L97809 Non-pressure chronic ulcer of other part of unspecified lower leg with unspecified severity: Secondary | ICD-10-CM | POA: Diagnosis not present

## 2012-12-20 DIAGNOSIS — T8189XA Other complications of procedures, not elsewhere classified, initial encounter: Secondary | ICD-10-CM | POA: Diagnosis not present

## 2013-01-20 ENCOUNTER — Ambulatory Visit (INDEPENDENT_AMBULATORY_CARE_PROVIDER_SITE_OTHER): Payer: Medicaid Other | Admitting: Internal Medicine

## 2013-01-20 ENCOUNTER — Encounter: Payer: Self-pay | Admitting: Internal Medicine

## 2013-01-20 VITALS — BP 145/95 | HR 68 | Temp 97.8°F | Ht 71.0 in | Wt 199.3 lb

## 2013-01-20 DIAGNOSIS — I10 Essential (primary) hypertension: Secondary | ICD-10-CM

## 2013-01-20 DIAGNOSIS — Z23 Encounter for immunization: Secondary | ICD-10-CM

## 2013-01-20 DIAGNOSIS — E1159 Type 2 diabetes mellitus with other circulatory complications: Secondary | ICD-10-CM

## 2013-01-20 DIAGNOSIS — E1149 Type 2 diabetes mellitus with other diabetic neurological complication: Secondary | ICD-10-CM

## 2013-01-20 DIAGNOSIS — S98919A Complete traumatic amputation of unspecified foot, level unspecified, initial encounter: Secondary | ICD-10-CM

## 2013-01-20 DIAGNOSIS — E785 Hyperlipidemia, unspecified: Secondary | ICD-10-CM

## 2013-01-20 DIAGNOSIS — Z89432 Acquired absence of left foot: Secondary | ICD-10-CM

## 2013-01-20 DIAGNOSIS — Z89511 Acquired absence of right leg below knee: Secondary | ICD-10-CM

## 2013-01-20 DIAGNOSIS — S88119A Complete traumatic amputation at level between knee and ankle, unspecified lower leg, initial encounter: Secondary | ICD-10-CM

## 2013-01-20 DIAGNOSIS — F17200 Nicotine dependence, unspecified, uncomplicated: Secondary | ICD-10-CM

## 2013-01-20 LAB — HM DIABETES EYE EXAM

## 2013-01-20 MED ORDER — NICOTINE 21 MG/24HR TD PT24: 1.0000 | MEDICATED_PATCH | TRANSDERMAL | Status: DC

## 2013-01-20 MED ORDER — OXYCODONE-ACETAMINOPHEN 5-325 MG PO TABS: 1.0000 | ORAL_TABLET | Freq: Three times a day (TID) | ORAL | Status: DC | PRN

## 2013-01-20 MED ORDER — GABAPENTIN 300 MG PO CAPS: 600.0000 mg | ORAL_CAPSULE | Freq: Three times a day (TID) | ORAL | Status: DC

## 2013-01-20 MED ORDER — INSULIN GLARGINE 100 UNIT/ML ~~LOC~~ SOLN: 42.0000 [IU] | Freq: Every day | SUBCUTANEOUS | Status: DC

## 2013-01-20 MED ORDER — INSULIN ASPART 100 UNIT/ML ~~LOC~~ SOLN: 3.0000 [IU] | Freq: Three times a day (TID) | SUBCUTANEOUS | Status: DC

## 2013-01-20 NOTE — Assessment & Plan Note (Signed)
Would like to retry NRT. Will prescribe nicotine patches.

## 2013-01-20 NOTE — Progress Notes (Addendum)
Patient ID: [name], male   DOB: April 14, 1965, 47 y.o.   MRN: [id number]   Subjective:   HPI: [name] is a 47 y.o. gentleman with past medical history of diabetes, hypertension, status post right BKA, depression, and peripheral neuropathy , presents to the clinic for routine followup visit.  Patient tells me today that he is soon going to have his prosthetic and he is the process for this. He will be starting a 'shrinker' on his stump tomorrow. He anticipates a lot of pain and has been advised to get some prescription for pain medications from me. He currently endorses pulling pain from his amputation stump, and the sensations of his great toe and heel of right foot (Phantom limb). He has tried a higher dose of Gabapentin (600 mg at bedtime) with some relief of these pains.   DM2, uncontrolled controlled -  Lab Results  Component Value Date   HGBA1C 11.1 01/20/2013   Patient checking blood sugars 3 times daily, before breakfast, before lunch, and before dinner. Reports fasting blood sugars of 60 -400 mg/dL. Currently taking Lantus 36 units at bedtime. In addition to NovoLog 3 times a day  4-6 units. Patient reports that he has been incarcerated over the last several weeks and as a result he has not been very compliant with his insulin.  Glucose meter indicates several hypoglycemic episodes since last visit. None is symptomatic per patient report. Denies assisted hypoglycemia or recently hospitalizations for either hyper or hypoglycemia. Denies polyuria, polydipsia, nausea, vomiting, diarrhea.  Does not request refills today.  Important diabetic medications: Is patient on aspirin? Yes Is patient on a statin? Yes Is patient on an ACE-I/ ARB? Yes   Kindly see the A&P for the status of the pt's other chronic medical problems.  Past Medical History  Diagnosis Date  . Onychomycosis   . Hypertension   . Diabetic peripheral neuropathy   . Diabetic foot ulcer     s/p Right first ray  amputation, left transmetatarsal amputation with revision  . History of drug abuse     Cocaine and marijuana  . Exposure to trichomonas     treated empirically  . Cellulitis of left foot 03/2008    left 4th and 5th metatarsal area  . Hyperlipidemia   . 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 Ulcerative esophagitis 2007    severe, complicated with UGI bleed  . Mallory Janann August tear April 2009  . History of prolonged Q-T interval on ECG   . 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  . 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 CKD (chronic kidney disease) stage 2, GFR 60-89 ml/min     BL SCr 1.3-1.4  . Depression   . Peripheral vascular disease   . Type 2 diabetes mellitus, uncontrolled, with renal complications   . GERD (gastroesophageal reflux disease)   . 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 ACCU-CHEK FASTCLIX LANCETS MISC 1 each by Does not apply route daily before breakfast.  102 each  2  . aspirin EC 81 MG tablet Take 1 tablet (81 mg total) by mouth daily.  150 tablet  2  . atorvastatin (LIPITOR) 20 MG tablet Take 1 tablet (20 mg total) by mouth daily.  30 tablet  0  . FLUoxetine (PROZAC) 10 MG capsule Take 1 capsule (10 mg total) by mouth daily.  90 capsule  0  . gabapentin (NEURONTIN) 300 MG capsule Take 2 capsules (600 mg total) by mouth 3 (three) times daily.  180 capsule  2  . glucose blood (ACCU-CHEK SMARTVIEW) test strip Use as instructed  100 each  12  . insulin aspart (NOVOLOG) 100 UNIT/ML injection Inject 3-5 Units into the skin 3 (three) times daily with meals. Blood sugar above 200 - take 3 units  Blood  sugar above 300 - take 4 units  Blood sugar above 400 - take 5 units  1 vial  12  . insulin glargine (LANTUS) 100 UNIT/ML injection Inject 0.42 mLs (42 Units total) into the skin at bedtime.  10 mL  12  . lisinopril (PRINIVIL,ZESTRIL) 20 MG tablet Take 2 tablets (40 mg total) by mouth daily.  90 tablet  0  . pantoprazole (PROTONIX) 40 MG tablet Take 1 tablet (40 mg total) by mouth daily.  90 tablet  11  . calcium carbonate (TUMS - DOSED IN MG ELEMENTAL CALCIUM) 500 MG chewable tablet Chew 2 tablets by mouth daily as needed for heartburn.      . nicotine (EQ NICOTINE) 21 mg/24hr patch Place 1 patch onto the skin daily.  42 patch  0  . oxyCODONE-acetaminophen (ROXICET) 5-325 MG per tablet Take 1 tablet by mouth every 8 (eight) hours as needed for pain.  120 tablet  0   No current facility-administered medications for this visit.   Family History  Problem Relation Age of Onset  . Diabetes type II Mother   . Pancreatic cancer Father    History   Social History  . Marital Status: Married    Spouse Name: N/A    Number of Children: N/A  . Years of Education: N/A   Social History Main Topics  . Smoking status: Current Some Day Smoker -- 0.50 packs/day for 34 years    Types: Cigarettes  . Smokeless tobacco: Never Used     Comment: Has stopped 2 weeks ago- QUIT line info given to pt and wife for asst with smoking patchers, etc  . Alcohol Use: Yes     Comment: 12/05/2011 "occasionally; maybe New Years"  . Drug Use: Yes    Special: Marijuana     Comment: 12/05/2011 "last time ~ 2 wk ago"  . Sexual Activity: Yes   Other Topics Concern  . None   Social History Narrative  . None   Review of Systems: Constitutional: Denies fever, chills, diaphoresis, appetite change and fatigue.  Respiratory: Denies SOB, DOE, cough, chest tightness, and wheezing. Denies chest pain. Cardiovascular: No chest pain, palpitations and leg swelling.  Gastrointestinal: No abdominal pain, nausea, vomiting, bloody  stools Genitourinary: No dysuria, frequency, hematuria, or flank pain.  Musculoskeletal: No myalgias, back pain, joint swelling, arthralgias  Psych: No depression symptoms. No SI or SA.   Objective:  Physical Exam: Filed Vitals:   01/20/13 1602  BP: 145/95  Pulse: 68  Temp: 97.8 F (36.6 C)  TempSrc: Oral  Height: 5\' 11"  (1.803 m)  Weight: 199 lb 4.8 oz (90.402 kg)  SpO2: 99%   General: Well nourished. In wheel chair. No acute distress. Wife in exam room.  HEENT: Normal oral mucosa. MMM.  Lungs: CTA bilaterally. Heart: RRR; no extra sounds  or murmurs  Abdomen: Non-distended, normal BS, soft, nontender; no hepatosplenomegaly  Extremities: right BKA. Stump without tenderness, no signs of infection. Appears healing well. Partial amputation of left foot without signs of infection, tenderness, or edema. Left foot otherwise with good pulses, normal sensation. LLE with power 5/5.  Both upper extremities have good strength with power of 5/5; normal sensation, normal DTR.    Neurologic: Normal EOM,  Alert and oriented x3. No obvious neurologic/cranial nerve deficits.  Assessment & Plan:  I have discussed my assessment and plan  with  my attending in the clinic, Dr. Josem Kaufmann as detailed under problem based charting.

## 2013-01-20 NOTE — Assessment & Plan Note (Signed)
Discussed short term Percocet while fitting the prosthetic. No long term opiates. Will increased Neurontin to 600 mg tid.

## 2013-01-20 NOTE — Assessment & Plan Note (Signed)
BP Readings from Last 3 Encounters:  01/20/13 145/95  11/13/12 135/79  10/09/12 137/89    Lab Results  Component Value Date   NA 134* 09/13/2012   K 4.0 09/13/2012   CREATININE 1.28 09/13/2012    Assessment: Blood pressure control: controlled Progress toward BP goal:  at goal Comments:  Well controlled  Plan: Medications:  continue current medications Educational resources provided:   Self management tools provided:   Other plans: will follow up in 2 weeks

## 2013-01-20 NOTE — Assessment & Plan Note (Signed)
Increase Gabapentin to 600 mg tid.

## 2013-01-20 NOTE — Assessment & Plan Note (Signed)
Lab Results  Component Value Date   HGBA1C 11.1 01/20/2013   HGBA1C 8.5 10/09/2012   HGBA1C 13.7 06/21/2012     Assessment: Diabetes control: poor control (HgbA1C >9%) Progress toward A1C goal:  deteriorated Comments: Recently incarcerated and noncompliance with insulin. Tenuous blood sugar.  Plan: Medications:  1. Increase Lantus from 36 units to 42 units at bedtime 2. Reduce Novolog to 3-5 units as CBG > 200 >>3 units; 300 >> 4 units and above 400 >> 5 units   Home glucose monitoring: Frequency: 4 times a day Timing: before breakfast;before lunch;before dinner;at bedtime Instruction/counseling given: reminded to bring blood glucose meter & log to each visit Educational resources provided:   Self management tools provided: copy of home glucose meter download;home glucose logbook Other plans: will follow up in 2-3 weeks with me.

## 2013-01-20 NOTE — Patient Instructions (Signed)
General Instructions: Please increase Lantus to 42 units at bedtime  Please take Novolog as  Blood sugar above 200 - take 3 units  Blood sugar above 300 - take 4 units  Blood sugar above 400 - take 5 units    I will increase Neurontin to 600 mg three times daily as needed for pain   I will give you 90 pills of Percocet for pain to use as 1 tablets every 8 hours for pain  I would like to see you in 3 weeks   Treatment Goals:  Goals (1 Years of Data) as of 01/20/13         As of Today 11/13/12 10/09/12 09/16/12 09/16/12     Blood Pressure    . Blood Pressure < 130/80  145/95 135/79 137/89 148/85 163/84    . Blood Pressure < 140/90  145/95 135/79 137/89 148/85 163/84     Lifestyle    . Quit smoking / using tobacco           Result Component    . HEMOGLOBIN A1C < 8.0  11.1  8.5      . LDL CALC < 100   59       . LDL CALC < 100   59         Progress Toward Treatment Goals:  Treatment Goal 01/20/2013  Hemoglobin A1C deteriorated  Blood pressure at goal  Stop smoking smoking the same amount    Self Care Goals & Plans:  Self Care Goal 01/20/2013  Manage my medications take my medicines as prescribed; bring my medications to every visit; refill my medications on time  Monitor my health keep track of my blood glucose; bring my glucose meter and log to each visit  Be physically active -    Home Blood Glucose Monitoring 01/20/2013  Check my blood sugar 4 times a day  When to check my blood sugar before breakfast; before lunch; before dinner; at bedtime     Care Management & Community Referrals:  Referral 11/13/2012  Referrals made for care management support -  Referrals made to community resources smoking cessation

## 2013-01-21 DIAGNOSIS — Z89432 Acquired absence of left foot: Secondary | ICD-10-CM | POA: Insufficient documentation

## 2013-01-21 NOTE — Addendum Note (Signed)
Addended by: Dow Adolph on: 01/21/2013 10:08 AM   Modules accepted: Orders

## 2013-01-21 NOTE — Progress Notes (Signed)
Case discussed with Dr. Kazibwe at time of visit. We reviewed the resident's history and exam and pertinent patient test results. I agree with the assessment, diagnosis, and plan of care documented in the resident's note. 

## 2013-01-28 NOTE — Addendum Note (Signed)
Addended by: Bufford Spikes on: 01/28/2013 09:13 AM   Modules accepted: Orders

## 2013-01-28 NOTE — Addendum Note (Signed)
Addended by: Bufford Spikes on: 01/28/2013 09:12 AM   Modules accepted: Orders

## 2013-02-04 ENCOUNTER — Ambulatory Visit: Payer: Medicaid Other | Admitting: Internal Medicine

## 2013-02-04 ENCOUNTER — Encounter: Payer: Self-pay | Admitting: Internal Medicine

## 2013-03-07 ENCOUNTER — Emergency Department (HOSPITAL_COMMUNITY)
Admission: EM | Admit: 2013-03-07 | Discharge: 2013-03-08 | Disposition: A | Discharge status: Home / Self Care | Payer: Medicaid Other | Attending: Emergency Medicine | Admitting: Emergency Medicine

## 2013-03-07 ENCOUNTER — Encounter (HOSPITAL_COMMUNITY): Payer: Self-pay | Admitting: Emergency Medicine

## 2013-03-07 DIAGNOSIS — Z794 Long term (current) use of insulin: Secondary | ICD-10-CM | POA: Insufficient documentation

## 2013-03-07 DIAGNOSIS — S98139A Complete traumatic amputation of one unspecified lesser toe, initial encounter: Secondary | ICD-10-CM | POA: Insufficient documentation

## 2013-03-07 DIAGNOSIS — F329 Major depressive disorder, single episode, unspecified: Secondary | ICD-10-CM | POA: Insufficient documentation

## 2013-03-07 DIAGNOSIS — G909 Disorder of the autonomic nervous system, unspecified: Secondary | ICD-10-CM | POA: Insufficient documentation

## 2013-03-07 DIAGNOSIS — F1410 Cocaine abuse, uncomplicated: Secondary | ICD-10-CM | POA: Insufficient documentation

## 2013-03-07 DIAGNOSIS — S88119A Complete traumatic amputation at level between knee and ankle, unspecified lower leg, initial encounter: Secondary | ICD-10-CM | POA: Insufficient documentation

## 2013-03-07 DIAGNOSIS — F3289 Other specified depressive episodes: Secondary | ICD-10-CM | POA: Insufficient documentation

## 2013-03-07 DIAGNOSIS — N182 Chronic kidney disease, stage 2 (mild): Secondary | ICD-10-CM | POA: Insufficient documentation

## 2013-03-07 DIAGNOSIS — K219 Gastro-esophageal reflux disease without esophagitis: Secondary | ICD-10-CM | POA: Insufficient documentation

## 2013-03-07 DIAGNOSIS — Z8619 Personal history of other infectious and parasitic diseases: Secondary | ICD-10-CM | POA: Insufficient documentation

## 2013-03-07 DIAGNOSIS — E1169 Type 2 diabetes mellitus with other specified complication: Secondary | ICD-10-CM | POA: Insufficient documentation

## 2013-03-07 DIAGNOSIS — F17200 Nicotine dependence, unspecified, uncomplicated: Secondary | ICD-10-CM | POA: Insufficient documentation

## 2013-03-07 DIAGNOSIS — Z79899 Other long term (current) drug therapy: Secondary | ICD-10-CM | POA: Insufficient documentation

## 2013-03-07 DIAGNOSIS — E1149 Type 2 diabetes mellitus with other diabetic neurological complication: Secondary | ICD-10-CM | POA: Insufficient documentation

## 2013-03-07 DIAGNOSIS — N058 Unspecified nephritic syndrome with other morphologic changes: Secondary | ICD-10-CM | POA: Insufficient documentation

## 2013-03-07 DIAGNOSIS — F1910 Other psychoactive substance abuse, uncomplicated: Secondary | ICD-10-CM | POA: Insufficient documentation

## 2013-03-07 DIAGNOSIS — L97509 Non-pressure chronic ulcer of other part of unspecified foot with unspecified severity: Secondary | ICD-10-CM | POA: Insufficient documentation

## 2013-03-07 DIAGNOSIS — E1129 Type 2 diabetes mellitus with other diabetic kidney complication: Secondary | ICD-10-CM | POA: Insufficient documentation

## 2013-03-07 DIAGNOSIS — Z7982 Long term (current) use of aspirin: Secondary | ICD-10-CM | POA: Insufficient documentation

## 2013-03-07 DIAGNOSIS — F1010 Alcohol abuse, uncomplicated: Secondary | ICD-10-CM | POA: Insufficient documentation

## 2013-03-07 DIAGNOSIS — I129 Hypertensive chronic kidney disease with stage 1 through stage 4 chronic kidney disease, or unspecified chronic kidney disease: Secondary | ICD-10-CM | POA: Insufficient documentation

## 2013-03-07 DIAGNOSIS — E785 Hyperlipidemia, unspecified: Secondary | ICD-10-CM | POA: Insufficient documentation

## 2013-03-07 LAB — CBC
HCT: 33 % — ABNORMAL LOW (ref 39.0–52.0)
Hemoglobin: 11.1 g/dL — ABNORMAL LOW (ref 13.0–17.0)
MCH: 29.9 pg (ref 26.0–34.0)
MCV: 88.9 fL (ref 78.0–100.0)
RBC: 3.71 MIL/uL — ABNORMAL LOW (ref 4.22–5.81)

## 2013-03-07 LAB — COMPREHENSIVE METABOLIC PANEL
CO2: 23 mEq/L (ref 19–32)
Calcium: 9.1 mg/dL (ref 8.4–10.5)
Creatinine, Ser: 1.4 mg/dL — ABNORMAL HIGH (ref 0.50–1.35)
GFR calc Af Amer: 68 mL/min — ABNORMAL LOW (ref >90)
GFR calc non Af Amer: 58 mL/min — ABNORMAL LOW (ref >90)
Glucose, Bld: 258 mg/dL — ABNORMAL HIGH (ref 70–99)

## 2013-03-07 LAB — RAPID URINE DRUG SCREEN, HOSP PERFORMED
Barbiturates: NOT DETECTED
Cocaine: NOT DETECTED
Opiates: NOT DETECTED

## 2013-03-07 MED ORDER — PANTOPRAZOLE SODIUM 40 MG PO TBEC: 40.0000 mg | DELAYED_RELEASE_TABLET | Freq: Every day | ORAL | Status: DC

## 2013-03-07 MED ORDER — LISINOPRIL 40 MG PO TABS
40.0000 mg | ORAL_TABLET | Freq: Every day | ORAL | Status: DC
  Filled 2013-03-07: qty 1

## 2013-03-07 MED ORDER — ALUM & MAG HYDROXIDE-SIMETH 200-200-20 MG/5ML PO SUSP: 30.0000 mL | ORAL | Status: DC | PRN

## 2013-03-07 MED ORDER — GABAPENTIN 300 MG PO CAPS
600.0000 mg | ORAL_CAPSULE | Freq: Three times a day (TID) | ORAL | Status: DC
  Administered 2013-03-07: 600 mg via ORAL
  Filled 2013-03-07 (×4): qty 2

## 2013-03-07 MED ORDER — INSULIN GLARGINE 100 UNIT/ML ~~LOC~~ SOLN
42.0000 [IU] | Freq: Every day | SUBCUTANEOUS | Status: DC
  Filled 2013-03-07: qty 0.42

## 2013-03-07 MED ORDER — ONDANSETRON HCL 4 MG PO TABS: 4.0000 mg | ORAL_TABLET | Freq: Three times a day (TID) | ORAL | Status: DC | PRN

## 2013-03-07 MED ORDER — CALCIUM CARBONATE ANTACID 500 MG PO CHEW
2.0000 | CHEWABLE_TABLET | Freq: Every day | ORAL | Status: DC | PRN
  Filled 2013-03-07: qty 2

## 2013-03-07 MED ORDER — FLUOXETINE HCL 10 MG PO CAPS
10.0000 mg | ORAL_CAPSULE | Freq: Every day | ORAL | Status: DC
  Filled 2013-03-07: qty 1

## 2013-03-07 MED ORDER — ATORVASTATIN CALCIUM 20 MG PO TABS
20.0000 mg | ORAL_TABLET | Freq: Every day | ORAL | Status: DC
  Filled 2013-03-07: qty 1

## 2013-03-07 MED ORDER — LORAZEPAM 1 MG PO TABS
0.0000 mg | ORAL_TABLET | Freq: Four times a day (QID) | ORAL | Status: DC
  Administered 2013-03-08: 1 mg via ORAL
  Filled 2013-03-07: qty 1

## 2013-03-07 MED ORDER — LORAZEPAM 1 MG PO TABS: 0.0000 mg | ORAL_TABLET | Freq: Two times a day (BID) | ORAL | Status: DC

## 2013-03-07 MED ORDER — NICOTINE 21 MG/24HR TD PT24: 21.0000 mg | MEDICATED_PATCH | Freq: Every day | TRANSDERMAL | Status: DC

## 2013-03-07 MED ORDER — ASPIRIN EC 81 MG PO TBEC
81.0000 mg | DELAYED_RELEASE_TABLET | Freq: Every day | ORAL | Status: DC
  Filled 2013-03-07: qty 1

## 2013-03-07 MED ORDER — IBUPROFEN 200 MG PO TABS: 600.0000 mg | ORAL_TABLET | Freq: Three times a day (TID) | ORAL | Status: DC | PRN

## 2013-03-07 MED ORDER — INSULIN ASPART 100 UNIT/ML ~~LOC~~ SOLN: 3.0000 [IU] | Freq: Three times a day (TID) | SUBCUTANEOUS | Status: DC

## 2013-03-07 NOTE — ED Notes (Signed)
Pt states he is here for detox from marijuana and cocaine and alcohol  Pt states he was referred to [place] from an outside clinic  Pt states last used was about an hour or two ago, marijuana and about half of a beer

## 2013-03-08 LAB — GLUCOSE, CAPILLARY
Glucose-Capillary: 53 mg/dL — ABNORMAL LOW (ref 70–99)
Glucose-Capillary: 221 mg/dL — ABNORMAL HIGH (ref 70–99)

## 2013-03-08 NOTE — ED Provider Notes (Signed)
CSN: [id number]     Arrival date & time 03/07/13  2054 History   First MD Initiated Contact with Patient 03/07/13 2110     Chief Complaint  Patient presents with  . Medical Clearance   (Consider location/radiation/quality/duration/timing/severity/associated sxs/prior Treatment) HPI History provided by pt.   Pt presents w/ request for detox from alcohol and cocaine.  Has been attending an outpatient program but they have threatened to kick him out because he fails his drug screens.  Has access to [facility] in his home and uses frequently.  Most recent drink this morning.  No h/o DTs.  Denies abusing any other substances.  Denies SI/HI.   Past Medical History  Diagnosis Date  . Onychomycosis   . Hypertension   . Diabetic peripheral neuropathy   . Diabetic foot ulcer     s/p Right first ray amputation, left transmetatarsal amputation with revision  . History of drug abuse     Cocaine and marijuana  . Exposure to trichomonas     treated empirically  . Cellulitis of left foot 03/2008    left 4th and 5th metatarsal area  . Hyperlipidemia   . 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 Ulcerative esophagitis 2007    severe, complicated with UGI bleed  . Mallory Janann August tear April 2009  . History of prolonged Q-T interval on ECG   . 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  . 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 CKD (chronic kidney disease) stage 2, GFR 60-89 ml/min     BL SCr 1.3-1.4  . Depression   . Peripheral vascular disease   . Type 2 diabetes mellitus, uncontrolled, with renal complications   . GERD (gastroesophageal reflux disease)   . 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 Toe amputation  06/2011    left; 4th toe  . Nephrectomy      L side  . I&d extremity  08/24/2011    Procedure: IRRIGATION AND DEBRIDEMENT EXTREMITY;  Surgeon: Cammy Copa, MD;  Location: [facility] OR;  Service: Orthopedics;  Laterality: Left;  . Amputation  08/24/2011    Procedure: AMPUTATION RAY;  Surgeon: Cammy Copa, MD;  Location: [facility] OR;  Service: Orthopedics;  Laterality: Left;  left fourth toe Ray Resection  . Amputation  12/05/2011    Procedure: AMPUTATION RAY;  Surgeon: Cammy Copa, MD;  Location: [facility] OR;  Service: Orthopedics;  Laterality: Left;  . I&d extremity  02/18/2012    Procedure: IRRIGATION AND DEBRIDEMENT EXTREMITY;  Surgeon: Kathryne Hitch, MD;  Location: [facility] OR;  Service: Orthopedics;  Laterality: Left;  . Amputation  02/18/2012    Procedure: AMPUTATION DIGIT;  Surgeon: Kathryne Hitch, MD;  Location: [facility] OR;  Service: Orthopedics;  Laterality: Left;  revison transmetatarsal  . I&d extremity Left 08/28/2012    Procedure: IRRIGATION AND DEBRIDEMENT EXTREMITY;  Surgeon: Sharma Covert, MD;  Location: [facility] OR;  Service: Orthopedics;  Laterality: Left;  CYSTO TUBING/IRRIGATION, LEAD HAND.  . I&d extremity Left 08/30/2012    Procedure: IRRIGATION AND DEBRIDEMENT EXTREMITY;  Surgeon: Sharma Covert, MD;  Location: [facility] OR;  Service: Orthopedics;  Laterality: Left;  . Amputation Right 09/01/2012    Procedure: FOOT 1ST RAY AMPUTATION;  Surgeon: Nadara Mustard, MD;  Location: [facility] OR;  Service: Orthopedics;  Laterality: Right;  . Amputation Left 09/01/2012    Procedure: FOOT REVISION TRANSMETATARSAL AMPUTATION ;  Surgeon: Nadara Mustard, MD;  Location: [facility] OR;  Service: Orthopedics;  Laterality: Left;  . Amputation Right 09/13/2012    Procedure: AMPUTATION BELOW KNEE;  Surgeon: Nadara Mustard, MD;  Location: [facility] OR;  Service: Orthopedics;  Laterality: Right;  Right Below Knee Amputation   Family History  Problem Relation Age  of Onset  . Diabetes type II Mother   . Pancreatic cancer Father    History  Substance Use Topics  . Smoking status: Current Every Day Smoker -- 0.50 packs/day for 34 years    Types: Cigarettes  . Smokeless tobacco: Never Used     Comment: Has stopped 2 weeks ago- QUIT line info given to pt and wife for asst with smoking patchers, etc  . Alcohol Use: Yes     Comment: occ    Review of Systems  All other systems reviewed and are negative.    Allergies  Review of patient's allergies indicates no known allergies.  Home Medications   Current Outpatient Rx  Name  Route  Sig  Dispense  Refill  . aspirin EC 81 MG tablet   Oral   Take 1 tablet (81 mg total) by mouth daily.   150 tablet   2   . atorvastatin (LIPITOR) 20 MG tablet   Oral   Take 1 tablet (20 mg total) by mouth daily.   30 tablet   0   . calcium carbonate (TUMS - DOSED IN MG ELEMENTAL CALCIUM) 500 MG chewable tablet   Oral   Chew 2 tablets by mouth daily as needed for heartburn.         Marland Kitchen FLUoxetine (PROZAC) 10 MG capsule   Oral   Take 1 capsule (10 mg total) by mouth daily.   90 capsule   0   . gabapentin (NEURONTIN) 300 MG capsule   Oral   Take 2 capsules (600 mg total) by mouth 3 (three) times daily.   180 capsule   2   . insulin aspart (NOVOLOG) 100 UNIT/ML injection   Subcutaneous   Inject 3-5 Units into the skin 3 (three) times daily with meals. Blood sugar above 200 - take 3 units  Blood sugar above 300 - take 4 units  Blood sugar above 400 - take 5 units   1 vial   12   . insulin glargine (LANTUS) 100 UNIT/ML injection   Subcutaneous   Inject 0.42 mLs (42 Units total) into the skin at bedtime.   10 mL   12   . lisinopril (PRINIVIL,ZESTRIL) 20 MG tablet   Oral   Take 2 tablets (40 mg total) by mouth daily.   90 tablet   0   . nicotine (EQ NICOTINE) 21 mg/24hr patch   Transdermal   Place 1 patch onto the skin daily.   42 patch   0   . oxyCODONE-acetaminophen (ROXICET) 5-325  MG per tablet   Oral   Take 1 tablet by mouth every 8 (eight) hours as needed for pain.   120 tablet   0   . pantoprazole (PROTONIX) 40 MG tablet   Oral   Take 1 tablet (40 mg total) by mouth daily.   90 tablet   11  BP 169/95  Pulse 98  Temp(Src) 98.9 F (37.2 C) (Oral)  Resp 18  SpO2 100% Physical Exam  Nursing note and vitals reviewed. Constitutional: He is oriented to person, place, and time. He appears well-developed and well-nourished. No distress.  HENT:  Head: Normocephalic and atraumatic.  Mouth/Throat: Oropharynx is clear and moist.  Eyes:  Normal appearance  Neck: Normal range of motion.  Cardiovascular: Normal rate and regular rhythm.   Pulmonary/Chest: Effort normal and breath sounds normal. No respiratory distress.  Musculoskeletal: Normal range of motion.  Neurological: He is alert and oriented to person, place, and time.  No tremors  Skin: Skin is warm and dry. No rash noted.  Psychiatric: He has a normal mood and affect. His behavior is normal.    ED Course  Procedures (including critical care time) Labs Review Labs Reviewed  CBC - Abnormal; Notable for the following:    RBC 3.71 (*)    Hemoglobin 11.1 (*)    HCT 33.0 (*)    All other components within normal limits  COMPREHENSIVE METABOLIC PANEL - Abnormal; Notable for the following:    Glucose, Bld 258 (*)    Creatinine, Ser 1.40 (*)    Albumin 3.4 (*)    Total Bilirubin 0.2 (*)    GFR calc non Af Amer 58 (*)    GFR calc Af Amer 68 (*)    All other components within normal limits  URINE RAPID DRUG SCREEN (HOSP PERFORMED) - Abnormal; Notable for the following:    Tetrahydrocannabinol POSITIVE (*)    All other components within normal limits  GLUCOSE, CAPILLARY - Abnormal; Notable for the following:    Glucose-Capillary 53 (*)    All other components within normal limits  ETHANOL   Imaging Review No results found.  EKG Interpretation   None       MDM   1. Polysubstance abuse     47yo M presents to ED for alcohol and cocaine detox.  Attends outpatient therapy, but has failed drug screen multiple times d/t easy access to [facility], and they have threatened to remove him from program.  He is on probation and his probation officer referred him to ED for inpatient detox.  Discussed case w/ Dr. Patria Mane who recommends discharge home.  Non-compliance with current program is not an indication for admission.  He discussed plan with patient and he was agreeable.      Otilio Miu, PA-C 03/09/13 2037

## 2013-03-08 NOTE — ED Notes (Signed)
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

## 2013-03-08 NOTE — ED Notes (Signed)
Pt belonging 2 bags in locker 26

## 2013-03-08 NOTE — ED Notes (Signed)
Pt.'s cbg checked,53mg /dl., no s/s of hypoglycemia, snacks offered

## 2013-03-08 NOTE — ED Notes (Signed)
Pt said he can't walk without a cane or wheelchair. Called Charge RN, Terri, pt moving back out to room 25 in main ed.

## 2013-03-08 NOTE — ED Notes (Signed)
MD at bedside. 

## 2013-03-08 NOTE — BH Assessment (Signed)
TTS received call for tele-assessment. Called Educational psychologist, PA-C for report and she said request for tele-assessment was an error and that tele-assessment was not necessary. Entry removed from tele-assessment log.  Harlin Rain Ria Comment, [facility] Triage Specialist

## 2013-03-10 ENCOUNTER — Emergency Department (HOSPITAL_COMMUNITY)
Admission: EM | Admit: 2013-03-10 | Discharge: 2013-03-10 | Disposition: A | Discharge status: Home / Self Care | Payer: Medicaid Other | Attending: Emergency Medicine | Admitting: Emergency Medicine

## 2013-03-10 ENCOUNTER — Encounter (HOSPITAL_COMMUNITY): Payer: Self-pay | Admitting: Emergency Medicine

## 2013-03-10 ENCOUNTER — Emergency Department (HOSPITAL_COMMUNITY): Payer: Medicaid Other

## 2013-03-10 DIAGNOSIS — F17200 Nicotine dependence, unspecified, uncomplicated: Secondary | ICD-10-CM | POA: Insufficient documentation

## 2013-03-10 DIAGNOSIS — N182 Chronic kidney disease, stage 2 (mild): Secondary | ICD-10-CM | POA: Insufficient documentation

## 2013-03-10 DIAGNOSIS — Z8619 Personal history of other infectious and parasitic diseases: Secondary | ICD-10-CM | POA: Insufficient documentation

## 2013-03-10 DIAGNOSIS — M7989 Other specified soft tissue disorders: Secondary | ICD-10-CM

## 2013-03-10 DIAGNOSIS — Z79899 Other long term (current) drug therapy: Secondary | ICD-10-CM | POA: Insufficient documentation

## 2013-03-10 DIAGNOSIS — F329 Major depressive disorder, single episode, unspecified: Secondary | ICD-10-CM | POA: Insufficient documentation

## 2013-03-10 DIAGNOSIS — Z872 Personal history of diseases of the skin and subcutaneous tissue: Secondary | ICD-10-CM | POA: Insufficient documentation

## 2013-03-10 DIAGNOSIS — I120 Hypertensive chronic kidney disease with stage 5 chronic kidney disease or end stage renal disease: Secondary | ICD-10-CM | POA: Insufficient documentation

## 2013-03-10 DIAGNOSIS — Z794 Long term (current) use of insulin: Secondary | ICD-10-CM | POA: Insufficient documentation

## 2013-03-10 DIAGNOSIS — R609 Edema, unspecified: Secondary | ICD-10-CM | POA: Insufficient documentation

## 2013-03-10 DIAGNOSIS — E1149 Type 2 diabetes mellitus with other diabetic neurological complication: Secondary | ICD-10-CM | POA: Insufficient documentation

## 2013-03-10 DIAGNOSIS — K219 Gastro-esophageal reflux disease without esophagitis: Secondary | ICD-10-CM | POA: Insufficient documentation

## 2013-03-10 DIAGNOSIS — R600 Localized edema: Secondary | ICD-10-CM

## 2013-03-10 DIAGNOSIS — E1142 Type 2 diabetes mellitus with diabetic polyneuropathy: Secondary | ICD-10-CM | POA: Insufficient documentation

## 2013-03-10 DIAGNOSIS — R209 Unspecified disturbances of skin sensation: Secondary | ICD-10-CM | POA: Insufficient documentation

## 2013-03-10 DIAGNOSIS — F3289 Other specified depressive episodes: Secondary | ICD-10-CM | POA: Insufficient documentation

## 2013-03-10 LAB — CBC WITH DIFFERENTIAL/PLATELET
Basophils Absolute: 0.1 10*3/uL (ref 0.0–0.1)
Basophils Relative: 1 % (ref 0–1)
Eosinophils Relative: 2 % (ref 0–5)
HCT: 31 % — ABNORMAL LOW (ref 39.0–52.0)
Hemoglobin: 10.6 g/dL — ABNORMAL LOW (ref 13.0–17.0)
Lymphs Abs: 1.8 10*3/uL (ref 0.7–4.0)
MCHC: 34.2 g/dL (ref 30.0–36.0)
Monocytes Absolute: 0.7 10*3/uL (ref 0.1–1.0)
Neutro Abs: 5.8 10*3/uL (ref 1.7–7.7)
Neutrophils Relative %: 68 % (ref 43–77)
Platelets: 347 10*3/uL (ref 150–400)
RDW: 13.3 % (ref 11.5–15.5)
WBC: 8.5 10*3/uL (ref 4.0–10.5)

## 2013-03-10 LAB — BASIC METABOLIC PANEL
Calcium: 8.6 mg/dL (ref 8.4–10.5)
Chloride: 109 mEq/L (ref 96–112)
Creatinine, Ser: 1.2 mg/dL (ref 0.50–1.35)
GFR calc Af Amer: 82 mL/min — ABNORMAL LOW (ref >90)
Sodium: 144 mEq/L (ref 135–145)

## 2013-03-10 NOTE — ED Notes (Signed)
PT IS IDDM, RIGHT BKA, PARITAL AMPUTATION LEFT FOOT AND NOW HAS SWELLING TO LEFT LOWER LEG AND A BLISTER ON STUMP OF LEFT FOOT. PT HAS "ONE KIDNEY" AND IS SEEKING DETOX FROM COCAINE.

## 2013-03-10 NOTE — Progress Notes (Signed)
VASCULAR LAB PRELIMINARY  PRELIMINARY  PRELIMINARY  PRELIMINARY  Left lower extremity venous Doppler completed.    Preliminary report:  There is no DVT or SVT noted in the left lower extremity.  There is an enlarged lymph node noted in the left groin.  Avion Kutzer, RVT 03/10/2013, 11:50 AM

## 2013-03-10 NOTE — ED Provider Notes (Signed)
Medical screening examination/treatment/procedure(s) were performed by non-physician practitioner and as supervising physician I was immediately available for consultation/collaboration.  EKG Interpretation   None        Saramarie Stinger R. Zya Finkle, MD 03/10/13 0002 

## 2013-03-10 NOTE — ED Provider Notes (Signed)
CSN: [id number]     Arrival date & time 03/10/13  4098 History   First MD Initiated Contact with Patient 03/10/13 [phone]     Chief Complaint  Patient presents with  . Leg Swelling    HPI  [name] is a 47 y.o. 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  History was provided by the patient.  Patient states that he recently got a new shoe for his left foot which is customized to fit his amputated toes.  He states that he did a lot of walking the day before yesterday. He took off his shoe last night and noticed that he had swelling from below his knee to his left foot. He denies any significant pain. Denies any trauma or injuries.  No weakness/loss of sensation.  He has numbness/tingling at baseline from neuropathy with no acute changes.  No fevers, chills, change in appetite/aactivity, nausea, emesis, abdominal pain, SOB, chest pain, headaches, or other concerns.   No hx of PE/clotting/DVT.    He was seen in the ED on 03/07/13 for cocaine detox and was discharged.  He states that he continues to use marijuana but denies any cocaine use for several weeks.  He is trying to get into a rehab facility but needs documentation of clearance and negative drug screen.  He is not requesting inpatient evaluation for cocaine detox and is working on OP follow-up.       Past Medical History  Diagnosis Date  . Onychomycosis   . Hypertension   . Diabetic peripheral neuropathy   . Diabetic foot ulcer     s/p Right first ray amputation, left transmetatarsal amputation with revision  . History of drug abuse     Cocaine and marijuana  . Exposure to trichomonas     treated empirically  . Cellulitis of left foot 03/2008    left 4th and 5th metatarsal  area  . Hyperlipidemia   . 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 Ulcerative esophagitis 2007    severe, complicated with UGI bleed  . Mallory Janann August tear April 2009  . History of prolonged Q-T interval on ECG   . 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  . 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 CKD (chronic kidney disease) stage 2, GFR 60-89 ml/min     BL SCr 1.3-1.4  . Depression   . Peripheral vascular disease   . Type 2 diabetes mellitus, uncontrolled, with renal complications   . GERD (gastroesophageal reflux disease)   . 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 Recurrent Foot Osteomyelitis 02/19/2012    Recurrent osteomyelitis of toes with multiple ray amputations Follows up with Dr Lajoyce Corners    Past Surgical History  Procedure Laterality Date  . Toe amputation  06/2011    left; 4th toe  . Nephrectomy      L side  . I&d extremity  08/24/2011    Procedure: IRRIGATION AND DEBRIDEMENT EXTREMITY;  Surgeon: Cammy Copa, MD;  Location: [facility] OR;  Service: Orthopedics;  Laterality: Left;  . Amputation  08/24/2011  Procedure: AMPUTATION RAY;  Surgeon: Cammy Copa, MD;  Location: [facility] OR;  Service: Orthopedics;  Laterality: Left;  left fourth toe Ray Resection  . Amputation  12/05/2011    Procedure: AMPUTATION RAY;  Surgeon: Cammy Copa, MD;  Location: [facility] OR;  Service: Orthopedics;  Laterality: Left;  . I&d extremity  02/18/2012    Procedure: IRRIGATION AND DEBRIDEMENT EXTREMITY;  Surgeon: Kathryne Hitch, MD;  Location: [facility] OR;  Service: Orthopedics;  Laterality: Left;  . Amputation  02/18/2012    Procedure: AMPUTATION DIGIT;  Surgeon: Kathryne Hitch, MD;  Location: [facility] OR;  Service: Orthopedics;  Laterality: Left;  revison transmetatarsal  . I&d extremity Left 08/28/2012    Procedure:  IRRIGATION AND DEBRIDEMENT EXTREMITY;  Surgeon: Sharma Covert, MD;  Location: [facility] OR;  Service: Orthopedics;  Laterality: Left;  CYSTO TUBING/IRRIGATION, LEAD HAND.  . I&d extremity Left 08/30/2012    Procedure: IRRIGATION AND DEBRIDEMENT EXTREMITY;  Surgeon: Sharma Covert, MD;  Location: [facility] OR;  Service: Orthopedics;  Laterality: Left;  . Amputation Right 09/01/2012    Procedure: FOOT 1ST RAY AMPUTATION;  Surgeon: Nadara Mustard, MD;  Location: [facility] OR;  Service: Orthopedics;  Laterality: Right;  . Amputation Left 09/01/2012    Procedure: FOOT REVISION TRANSMETATARSAL AMPUTATION ;  Surgeon: Nadara Mustard, MD;  Location: [facility] OR;  Service: Orthopedics;  Laterality: Left;  . Amputation Right 09/13/2012    Procedure: AMPUTATION BELOW KNEE;  Surgeon: Nadara Mustard, MD;  Location: [facility] OR;  Service: Orthopedics;  Laterality: Right;  Right Below Knee Amputation   Family History  Problem Relation Age of Onset  . Diabetes type II Mother   . Pancreatic cancer Father    History  Substance Use Topics  . Smoking status: Current Every Day Smoker -- 0.50 packs/day for 34 years    Types: Cigarettes  . Smokeless tobacco: Never Used     Comment: Has stopped 2 weeks ago- Chief Operating Officer given to pt and wife for asst with smoking patchers, etc  . Alcohol Use: Yes     Comment: occ    Review of Systems  Constitutional: Negative for fever, chills, diaphoresis, activity change, appetite change and fatigue.  Respiratory: Negative for cough and shortness of breath.   Cardiovascular: Positive for leg swelling. Negative for chest pain.  Gastrointestinal: Negative for nausea, vomiting and abdominal pain.  Genitourinary: Negative for dysuria.  Musculoskeletal: Negative for arthralgias, back pain, gait problem and neck pain.  Skin: Negative for color change and wound.  Neurological: Positive for numbness (at baseline). Negative for dizziness, weakness, light-headedness and headaches.  Psychiatric/Behavioral: Negative for  confusion.    Allergies  Review of patient's allergies indicates no known allergies.  Home Medications   Current Outpatient Rx  Name  Route  Sig  Dispense  Refill  . acetaminophen (TYLENOL) 500 MG tablet   Oral   Take 1,000 mg by mouth every 8 (eight) hours as needed.         . calcium carbonate (TUMS - DOSED IN MG ELEMENTAL CALCIUM) 500 MG chewable tablet   Oral   Chew 2 tablets by mouth daily as needed for heartburn.         Marland Kitchen FLUoxetine (PROZAC) 10 MG capsule   Oral   Take 1 capsule (10 mg total) by mouth daily.   90 capsule   0   . gabapentin (NEURONTIN) 300 MG capsule   Oral   Take 2 capsules (600 mg total) by mouth  3 (three) times daily.   180 capsule   2   . insulin glargine (LANTUS) 100 UNIT/ML injection   Subcutaneous   Inject 0.42 mLs (42 Units total) into the skin at bedtime.   10 mL   12   . lisinopril (PRINIVIL,ZESTRIL) 20 MG tablet   Oral   Take 2 tablets (40 mg total) by mouth daily.   90 tablet   0   . nicotine (EQ NICOTINE) 21 mg/24hr patch   Transdermal   Place 1 patch onto the skin daily.   42 patch   0   . oxyCODONE-acetaminophen (ROXICET) 5-325 MG per tablet   Oral   Take 1 tablet by mouth every 8 (eight) hours as needed for pain.   120 tablet   0   . pantoprazole (PROTONIX) 40 MG tablet   Oral   Take 1 tablet (40 mg total) by mouth daily.   90 tablet   11   . insulin aspart (NOVOLOG) 100 UNIT/ML injection   Subcutaneous   Inject 3-5 Units into the skin 3 (three) times daily with meals. Blood sugar above 200 - take 3 units  Blood sugar above 300 - take 4 units  Blood sugar above 400 - take 5 units   1 vial   12    BP 160/92  Pulse 104  Temp(Src) 98.6 F (37 C) (Oral)  Resp 18  SpO2 100%  Filed Vitals:   03/10/13 0844 03/10/13 1020 03/10/13 1245  BP: 160/92 151/95 163/107  Pulse: 104 102 97  Temp: 98.6 F (37 C) 98 F (36.7 C) 98 F (36.7 C)  TempSrc: Oral Oral Oral  Resp: 18 18 18   SpO2: 100% 100% 100%     Physical Exam  Nursing note and vitals reviewed. Constitutional: He is oriented to person, place, and time. He appears well-developed and well-nourished. No distress.  HENT:  Head: Normocephalic and atraumatic.  Right Ear: External ear normal.  Left Ear: External ear normal.  Nose: Nose normal.  Mouth/Throat: Oropharynx is clear and moist.  Eyes: Conjunctivae are normal. Right eye exhibits no discharge. Left eye exhibits no discharge.  Neck: Normal range of motion. Neck supple.  Cardiovascular: Normal rate, regular rhythm, normal heart sounds and intact distal pulses.  Exam reveals no gallop and no friction rub.   No murmur heard. Dorsalis pedis pulse present in the left foot.  Pulmonary/Chest: Effort normal and breath sounds normal. No respiratory distress. He has no wheezes. He has no rales. He exhibits no tenderness.  Abdominal: Soft. He exhibits no distension. There is no tenderness.  Musculoskeletal: Normal range of motion. He exhibits edema. He exhibits no tenderness.  Mild circumferential edema from the mid-calf to the left foot.  Trace pitting edema in the left ankle.  No open wounds, lacerations, ecchymosis, or erythema.  Small area of callus/superficial abrasion to the left lateral mid-foot.  No tenderness to palpation to the left foot and leg throughout.  No limitations with left knee and hip flexion and extension.  Toes amputated on left foot.  Right prosthetic leg in place.     Neurological: He is alert and oriented to person, place, and time.  Sensation intact in the left leg  Skin: Skin is warm and dry. He is not diaphoretic.    ED Course  Procedures (including critical care time) Labs Review Labs Reviewed - No data to display Imaging Review No results found.  EKG Interpretation   None      VASCULAR LAB  PRELIMINARY PRELIMINARY PRELIMINARY PRELIMINARY  Left lower extremity venous Doppler completed.  Preliminary report: There is no DVT or SVT noted in the  left lower extremity. There is an enlarged lymph node noted in the left groin.  KANADY, CANDACE, RVT  03/10/2013, 11:50 AM  Results for orders placed during the hospital encounter of 03/10/13  CBC WITH DIFFERENTIAL      Result Value Range   WBC 8.5  4.0 - 10.5 K/uL   RBC 3.44 (*) 4.22 - 5.81 MIL/uL   Hemoglobin 10.6 (*) 13.0 - 17.0 g/dL   HCT 16.1 (*) 09.6 - 04.5 %   MCV 90.1  78.0 - 100.0 fL   MCH 30.8  26.0 - 34.0 pg   MCHC 34.2  30.0 - 36.0 g/dL   RDW 40.9  81.1 - 91.4 %   Platelets 347  150 - 400 K/uL   Neutrophils Relative % 68  43 - 77 %   Neutro Abs 5.8  1.7 - 7.7 K/uL   Lymphocytes Relative 21  12 - 46 %   Lymphs Abs 1.8  0.7 - 4.0 K/uL   Monocytes Relative 8  3 - 12 %   Monocytes Absolute 0.7  0.1 - 1.0 K/uL   Eosinophils Relative 2  0 - 5 %   Eosinophils Absolute 0.2  0.0 - 0.7 K/uL   Basophils Relative 1  0 - 1 %   Basophils Absolute 0.1  0.0 - 0.1 K/uL  BASIC METABOLIC PANEL      Result Value Range   Sodium 144  135 - 145 mEq/L   Potassium 3.9  3.5 - 5.1 mEq/L   Chloride 109  96 - 112 mEq/L   CO2 25  19 - 32 mEq/L   Glucose, Bld 156 (*) 70 - 99 mg/dL   BUN 10  6 - 23 mg/dL   Creatinine, Ser 7.82  0.50 - 1.35 mg/dL   Calcium 8.6  8.4 - 95.6 mg/dL   GFR calc non Af Amer 70 (*) >90 mL/min   GFR calc Af Amer 82 (*) >90 mL/min     DG Foot Complete Left (Final result)  Result time: 03/10/13 10:51:25    Final result by Rad Results In Interface (03/10/13 10:51:25)    Narrative:   CLINICAL DATA: The soft tissue edema  EXAM: LEFT FOOT - COMPLETE 3+ VIEW  COMPARISON: April 26, 2012  FINDINGS: Frontal, oblique, and lateral views were obtained. The patient has had amputations at the levels of the respective proximal metatarsals. There is no fracture or dislocation. No erosive change or bony destruction. No is apparent soft tissue mass or abscess. There are spurs arising from the posterior and inferior calcaneus.  IMPRESSION: Postoperative changes. Calcaneal  spurs. No acute fracture or dislocation. No erosive change or bony destruction.   Electronically Signed By: Bretta Bang M.D. On: 03/10/2013 10:51             DG Ankle 2 Views Left (Final result)  Result time: 03/10/13 10:51:56    Final result by Rad Results In Interface (03/10/13 10:51:56)    Narrative:   CLINICAL DATA: Soft tissue swelling  EXAM: LEFT ANKLE - 2 VIEW  COMPARISON: None.  FINDINGS: Frontal and lateral views were obtained. There is mild generalized soft tissue swelling. No fracture or effusion. Ankle mortise appears intact. There are spurs arising from the posterior inferior calcaneus.  IMPRESSION: Calcaneal spurs. Mild generalized soft tissue swelling. No fracture. No bony destruction.   Electronically Signed By: Chrissie Noa  Margarita Grizzle M.D. On: 03/10/2013 10:51         MDM   [name] is a 47 y.o. 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   Rechecks  12:45 PM = Patient on the phone in no acute distress.  No pain.    Patient evaluated in the ED for left leg edema. Edema possibly due to his new leg prosthesis exacerbated by his increased activity the day prior to the onset of this swelling. Ultrasound negative for DVT. X-rays negative for fracture or malalignment. Patient has a significant history of osteomyelitis. He did not have an elevated white blood cell count or fever. He has no external signs of infection at this time. He has mild anemia which appears to be his baseline. His labs are otherwise unremarkable. Patient neurovascularly intact. Patient was instructed to followup with his orthopedic physician and primary care provider for further evaluation and management. Return  precautions were discussed. Patient was evaluated by social work for OP assistance with management of his cocaine abuse.  Resources were also provided. Patient was in agreement with discharge and plan.     Discharge Medication List as of 03/10/2013 12:45 PM       Final impressions: 1. Leg edema, left       Luiz Iron PA-C   This patient was discussed with Dr. Barry Dienes, PA-C 03/11/13 1456

## 2013-03-12 NOTE — ED Provider Notes (Signed)
Medical screening examination/treatment/procedure(s) were conducted as a shared visit with non-physician practitioner(s) and myself.  I personally evaluated the patient during the encounter.  EKG Interpretation   None       PT with leg pain, no DVT or signs of cellulitis. Also asking for cocaine detox and long term treatment. Given resource guide, recommend he contact [facility] or [facility]. No indication for inpatient detox.   Charles B. Bernette Mayers, MD 03/12/13 2000

## 2013-07-22 ENCOUNTER — Emergency Department (HOSPITAL_COMMUNITY): Payer: Medicaid Other

## 2013-07-22 ENCOUNTER — Encounter (HOSPITAL_COMMUNITY): Payer: Self-pay | Admitting: Emergency Medicine

## 2013-07-22 ENCOUNTER — Inpatient Hospital Stay (HOSPITAL_COMMUNITY)
Admission: EM | Admit: 2013-07-22 | Discharge: 2013-07-24 | DRG: 638 | Disposition: A | Discharge status: Home Health | Payer: Medicaid Other | Attending: Internal Medicine | Admitting: Internal Medicine

## 2013-07-22 DIAGNOSIS — F32A Depression, unspecified: Secondary | ICD-10-CM | POA: Diagnosis present

## 2013-07-22 DIAGNOSIS — F1210 Cannabis abuse, uncomplicated: Secondary | ICD-10-CM | POA: Diagnosis present

## 2013-07-22 DIAGNOSIS — I129 Hypertensive chronic kidney disease with stage 1 through stage 4 chronic kidney disease, or unspecified chronic kidney disease: Secondary | ICD-10-CM | POA: Diagnosis present

## 2013-07-22 DIAGNOSIS — E1169 Type 2 diabetes mellitus with other specified complication: Principal | ICD-10-CM

## 2013-07-22 DIAGNOSIS — E1142 Type 2 diabetes mellitus with diabetic polyneuropathy: Secondary | ICD-10-CM | POA: Diagnosis present

## 2013-07-22 DIAGNOSIS — Y92009 Unspecified place in unspecified non-institutional (private) residence as the place of occurrence of the external cause: Secondary | ICD-10-CM

## 2013-07-22 DIAGNOSIS — Z91199 Patient's noncompliance with other medical treatment and regimen due to unspecified reason: Secondary | ICD-10-CM

## 2013-07-22 DIAGNOSIS — L03119 Cellulitis of unspecified part of limb: Secondary | ICD-10-CM

## 2013-07-22 DIAGNOSIS — Z9119 Patient's noncompliance with other medical treatment and regimen: Secondary | ICD-10-CM

## 2013-07-22 DIAGNOSIS — Z89519 Acquired absence of unspecified leg below knee: Secondary | ICD-10-CM

## 2013-07-22 DIAGNOSIS — Z794 Long term (current) use of insulin: Secondary | ICD-10-CM

## 2013-07-22 DIAGNOSIS — E1165 Type 2 diabetes mellitus with hyperglycemia: Secondary | ICD-10-CM | POA: Diagnosis not present

## 2013-07-22 DIAGNOSIS — IMO0002 Reserved for concepts with insufficient information to code with codable children: Secondary | ICD-10-CM

## 2013-07-22 DIAGNOSIS — R680 Hypothermia, not associated with low environmental temperature: Secondary | ICD-10-CM | POA: Diagnosis present

## 2013-07-22 DIAGNOSIS — K219 Gastro-esophageal reflux disease without esophagitis: Secondary | ICD-10-CM | POA: Diagnosis present

## 2013-07-22 DIAGNOSIS — Z89432 Acquired absence of left foot: Secondary | ICD-10-CM

## 2013-07-22 DIAGNOSIS — F17200 Nicotine dependence, unspecified, uncomplicated: Secondary | ICD-10-CM | POA: Diagnosis present

## 2013-07-22 DIAGNOSIS — N182 Chronic kidney disease, stage 2 (mild): Secondary | ICD-10-CM | POA: Diagnosis present

## 2013-07-22 DIAGNOSIS — X31XXXA Exposure to excessive natural cold, initial encounter: Secondary | ICD-10-CM | POA: Diagnosis present

## 2013-07-22 DIAGNOSIS — S88119A Complete traumatic amputation at level between knee and ankle, unspecified lower leg, initial encounter: Secondary | ICD-10-CM

## 2013-07-22 DIAGNOSIS — I10 Essential (primary) hypertension: Secondary | ICD-10-CM

## 2013-07-22 DIAGNOSIS — L02619 Cutaneous abscess of unspecified foot: Secondary | ICD-10-CM | POA: Diagnosis present

## 2013-07-22 DIAGNOSIS — E1140 Type 2 diabetes mellitus with diabetic neuropathy, unspecified: Secondary | ICD-10-CM | POA: Diagnosis present

## 2013-07-22 DIAGNOSIS — F1410 Cocaine abuse, uncomplicated: Secondary | ICD-10-CM | POA: Diagnosis present

## 2013-07-22 DIAGNOSIS — E162 Hypoglycemia, unspecified: Secondary | ICD-10-CM

## 2013-07-22 DIAGNOSIS — S98919A Complete traumatic amputation of unspecified foot, level unspecified, initial encounter: Secondary | ICD-10-CM

## 2013-07-22 DIAGNOSIS — E785 Hyperlipidemia, unspecified: Secondary | ICD-10-CM | POA: Diagnosis present

## 2013-07-22 DIAGNOSIS — E118 Type 2 diabetes mellitus with unspecified complications: Secondary | ICD-10-CM

## 2013-07-22 DIAGNOSIS — E1149 Type 2 diabetes mellitus with other diabetic neurological complication: Secondary | ICD-10-CM | POA: Diagnosis present

## 2013-07-22 DIAGNOSIS — F329 Major depressive disorder, single episode, unspecified: Secondary | ICD-10-CM

## 2013-07-22 DIAGNOSIS — T68XXXA Hypothermia, initial encounter: Secondary | ICD-10-CM | POA: Diagnosis present

## 2013-07-22 DIAGNOSIS — L0390 Cellulitis, unspecified: Secondary | ICD-10-CM

## 2013-07-22 LAB — URINE MICROSCOPIC-ADD ON

## 2013-07-22 LAB — URINALYSIS, ROUTINE W REFLEX MICROSCOPIC
Bilirubin Urine: NEGATIVE
Glucose, UA: NEGATIVE mg/dL
KETONES UR: NEGATIVE mg/dL
LEUKOCYTES UA: NEGATIVE
Nitrite: NEGATIVE
PH: 5 (ref 5.0–8.0)
PROTEIN: 100 mg/dL — AB
Specific Gravity, Urine: 1.023 (ref 1.005–1.030)
Urobilinogen, UA: 1 mg/dL (ref 0.0–1.0)

## 2013-07-22 LAB — CBC WITH DIFFERENTIAL/PLATELET
BASOS PCT: 0 % (ref 0–1)
Basophils Absolute: 0 10*3/uL (ref 0.0–0.1)
EOS ABS: 0.1 10*3/uL (ref 0.0–0.7)
EOS PCT: 1 % (ref 0–5)
HEMATOCRIT: 35.7 % — AB (ref 39.0–52.0)
HEMOGLOBIN: 12.2 g/dL — AB (ref 13.0–17.0)
LYMPHS ABS: 1.3 10*3/uL (ref 0.7–4.0)
Lymphocytes Relative: 12 % (ref 12–46)
MCH: 30.3 pg (ref 26.0–34.0)
MCHC: 34.2 g/dL (ref 30.0–36.0)
MCV: 88.8 fL (ref 78.0–100.0)
MONO ABS: 0.8 10*3/uL (ref 0.1–1.0)
Monocytes Relative: 7 % (ref 3–12)
Neutro Abs: 9 10*3/uL — ABNORMAL HIGH (ref 1.7–7.7)
Neutrophils Relative %: 80 % — ABNORMAL HIGH (ref 43–77)
Platelets: 384 10*3/uL (ref 150–400)
RBC: 4.02 MIL/uL — ABNORMAL LOW (ref 4.22–5.81)
RDW: 12.7 % (ref 11.5–15.5)
WBC: 11.3 10*3/uL — ABNORMAL HIGH (ref 4.0–10.5)

## 2013-07-22 LAB — COMPREHENSIVE METABOLIC PANEL
ALBUMIN: 3.5 g/dL (ref 3.5–5.2)
ALT: 13 U/L (ref 0–53)
AST: 23 U/L (ref 0–37)
Alkaline Phosphatase: 115 U/L (ref 39–117)
BUN: 24 mg/dL — ABNORMAL HIGH (ref 6–23)
CO2: 23 mEq/L (ref 19–32)
CREATININE: 1.34 mg/dL (ref 0.50–1.35)
Calcium: 9.3 mg/dL (ref 8.4–10.5)
Chloride: 101 mEq/L (ref 96–112)
GFR calc Af Amer: 71 mL/min — ABNORMAL LOW (ref >90)
GFR calc non Af Amer: 62 mL/min — ABNORMAL LOW (ref >90)
Glucose, Bld: 78 mg/dL (ref 70–99)
Potassium: 3.8 mEq/L (ref 3.7–5.3)
Sodium: 140 mEq/L (ref 137–147)
TOTAL PROTEIN: 7.6 g/dL (ref 6.0–8.3)
Total Bilirubin: 0.3 mg/dL (ref 0.3–1.2)

## 2013-07-22 LAB — GLUCOSE, CAPILLARY
GLUCOSE-CAPILLARY: 459 mg/dL — AB (ref 70–99)
GLUCOSE-CAPILLARY: 338 mg/dL — AB (ref 70–99)
Glucose-Capillary: 306 mg/dL — ABNORMAL HIGH (ref 70–99)
Glucose-Capillary: 258 mg/dL — ABNORMAL HIGH (ref 70–99)
Glucose-Capillary: 307 mg/dL — ABNORMAL HIGH (ref 70–99)

## 2013-07-22 LAB — CBG MONITORING, ED
GLUCOSE-CAPILLARY: 91 mg/dL (ref 70–99)
Glucose-Capillary: 143 mg/dL — ABNORMAL HIGH (ref 70–99)
Glucose-Capillary: 150 mg/dL — ABNORMAL HIGH (ref 70–99)
Glucose-Capillary: 300 mg/dL — ABNORMAL HIGH (ref 70–99)

## 2013-07-22 LAB — HEMOGLOBIN A1C
Hgb A1c MFr Bld: 11.5 % — ABNORMAL HIGH (ref <5.7)
Mean Plasma Glucose: 283 mg/dL — ABNORMAL HIGH (ref <117)

## 2013-07-22 LAB — I-STAT CG4 LACTIC ACID, ED: LACTIC ACID, VENOUS: 2.05 mmol/L (ref 0.5–2.2)

## 2013-07-22 MED ORDER — INSULIN GLARGINE 100 UNIT/ML ~~LOC~~ SOLN: 20.0000 [IU] | Freq: Every day | SUBCUTANEOUS | Status: DC

## 2013-07-22 MED ORDER — VANCOMYCIN HCL IN DEXTROSE 1-5 GM/200ML-% IV SOLN
1000.0000 mg | Freq: Once | INTRAVENOUS | Status: AC
  Administered 2013-07-22: 1000 mg via INTRAVENOUS
  Filled 2013-07-22: qty 200

## 2013-07-22 MED ORDER — PIPERACILLIN-TAZOBACTAM 3.375 G IVPB
3.3750 g | Freq: Three times a day (TID) | INTRAVENOUS | Status: DC
  Administered 2013-07-22 – 2013-07-24 (×6): 3.375 g via INTRAVENOUS
  Filled 2013-07-22 (×9): qty 50

## 2013-07-22 MED ORDER — INSULIN ASPART 100 UNIT/ML ~~LOC~~ SOLN
0.0000 [IU] | Freq: Three times a day (TID) | SUBCUTANEOUS | Status: DC
  Administered 2013-07-23: 3 [IU] via SUBCUTANEOUS
  Administered 2013-07-23: 5 [IU] via SUBCUTANEOUS
  Administered 2013-07-23: 8 [IU] via SUBCUTANEOUS
  Administered 2013-07-24 (×2): 3 [IU] via SUBCUTANEOUS

## 2013-07-22 MED ORDER — INSULIN GLARGINE 100 UNIT/ML ~~LOC~~ SOLN
10.0000 [IU] | SUBCUTANEOUS | Status: AC
  Administered 2013-07-22: 10 [IU] via SUBCUTANEOUS
  Filled 2013-07-22: qty 0.1

## 2013-07-22 MED ORDER — FLUOXETINE HCL 10 MG PO CAPS
10.0000 mg | ORAL_CAPSULE | Freq: Every day | ORAL | Status: DC
  Administered 2013-07-22 – 2013-07-24 (×3): 10 mg via ORAL
  Filled 2013-07-22 (×3): qty 1

## 2013-07-22 MED ORDER — INSULIN ASPART 100 UNIT/ML ~~LOC~~ SOLN
0.0000 [IU] | Freq: Three times a day (TID) | SUBCUTANEOUS | Status: DC
  Administered 2013-07-22: 7 [IU] via SUBCUTANEOUS
  Administered 2013-07-22: 5 [IU] via SUBCUTANEOUS

## 2013-07-22 MED ORDER — PIPERACILLIN-TAZOBACTAM 3.375 G IVPB 30 MIN
3.3750 g | Freq: Once | INTRAVENOUS | Status: AC
  Administered 2013-07-22: 3.375 g via INTRAVENOUS
  Filled 2013-07-22: qty 50

## 2013-07-22 MED ORDER — DEXTROSE 10 % IV SOLN
INTRAVENOUS | Status: DC
  Administered 2013-07-22: 02:00:00 via INTRAVENOUS

## 2013-07-22 MED ORDER — HEPARIN SODIUM (PORCINE) 5000 UNIT/ML IJ SOLN
5000.0000 [IU] | Freq: Three times a day (TID) | INTRAMUSCULAR | Status: DC
  Administered 2013-07-22 – 2013-07-24 (×7): 5000 [IU] via SUBCUTANEOUS
  Filled 2013-07-22 (×10): qty 1

## 2013-07-22 MED ORDER — NICOTINE 14 MG/24HR TD PT24
14.0000 mg | MEDICATED_PATCH | Freq: Every day | TRANSDERMAL | Status: DC
  Administered 2013-07-22 – 2013-07-24 (×3): 14 mg via TRANSDERMAL
  Filled 2013-07-22 (×3): qty 1

## 2013-07-22 MED ORDER — INSULIN GLARGINE 100 UNIT/ML ~~LOC~~ SOLN
20.0000 [IU] | Freq: Every day | SUBCUTANEOUS | Status: DC
  Administered 2013-07-22: 20 [IU] via SUBCUTANEOUS
  Filled 2013-07-22 (×3): qty 0.2

## 2013-07-22 MED ORDER — INSULIN GLARGINE 100 UNIT/ML ~~LOC~~ SOLN
30.0000 [IU] | Freq: Every day | SUBCUTANEOUS | Status: DC
  Administered 2013-07-23: 30 [IU] via SUBCUTANEOUS
  Filled 2013-07-22 (×2): qty 0.3

## 2013-07-22 MED ORDER — ACETAMINOPHEN 325 MG PO TABS
650.0000 mg | ORAL_TABLET | Freq: Four times a day (QID) | ORAL | Status: DC | PRN
  Administered 2013-07-22 – 2013-07-23 (×3): 650 mg via ORAL
  Filled 2013-07-22 (×3): qty 2

## 2013-07-22 MED ORDER — VANCOMYCIN HCL IN DEXTROSE 1-5 GM/200ML-% IV SOLN
1000.0000 mg | Freq: Two times a day (BID) | INTRAVENOUS | Status: DC
Start: 2013-07-22 — End: 2013-07-24
  Administered 2013-07-22 – 2013-07-24 (×4): 1000 mg via INTRAVENOUS
  Filled 2013-07-22 (×6): qty 200

## 2013-07-22 MED ORDER — GABAPENTIN 300 MG PO CAPS
600.0000 mg | ORAL_CAPSULE | Freq: Three times a day (TID) | ORAL | Status: DC
  Administered 2013-07-22 – 2013-07-24 (×7): 600 mg via ORAL
  Filled 2013-07-22 (×9): qty 2

## 2013-07-22 MED ORDER — PANTOPRAZOLE SODIUM 40 MG PO TBEC
40.0000 mg | DELAYED_RELEASE_TABLET | Freq: Every day | ORAL | Status: DC
  Administered 2013-07-22 – 2013-07-24 (×3): 40 mg via ORAL
  Filled 2013-07-22 (×3): qty 1

## 2013-07-22 NOTE — Progress Notes (Signed)
ANTIBIOTIC CONSULT NOTE - INITIAL  Pharmacy Consult for Vancocin and Zosyn Indication: cellulitis  No Known Allergies  Patient Measurements: Height: 5\' 8"  (172.7 cm) Weight: 210 lb (95.255 kg) IBW/kg (Calculated) : 68.4  Vital Signs: Temp: 97.7 F (36.5 C) (04/14 0505) Temp src: Oral (04/14 0505) BP: 142/70 mmHg (04/14 0615) Pulse Rate: 93 (04/14 0615) Intake/Output from previous day: 04/13 0701 - 04/14 0700 In: 250 [I.V.:250] Out: 300 [Urine:300] Intake/Output from this shift: Total I/O In: 250 [I.V.:250] Out: 300 [Urine:300]  Labs:  Recent Labs  07/22/13 0316  WBC 11.3*  HGB 12.2*  PLT 384  CREATININE 1.34   Estimated Creatinine Clearance: 76.3 ml/min (by C-G formula based on Cr of 1.34).   Microbiology: No results found for this or any previous visit (from the past 720 hour(s)).  Medical History: Past Medical History  Diagnosis Date  . Onychomycosis   . Hypertension   . Diabetic peripheral neuropathy   . Diabetic foot ulcer     s/p Right first ray amputation, left transmetatarsal amputation with revision  . History of drug abuse     Cocaine and marijuana  . Exposure to trichomonas     treated empirically  . Cellulitis of left foot 03/2008    left 4th and 5th metatarsal area  . Hyperlipidemia   . Alcoholic pancreatitis 79/0240  . Ulcerative esophagitis 9735    severe, complicated with UGI bleed  . Mallory [name] tear April 2009  . History of prolonged Q-T interval on ECG   . 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  . 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 CKD (chronic kidney disease) stage 2, GFR 60-89 ml/min     BL SCr 1.3-1.4  . Depression   . Peripheral vascular disease   . Type 2 diabetes mellitus, uncontrolled, with renal complications   . GERD (gastroesophageal reflux disease)   . Hx of right BKA  09/13/2012     Due to severe wound infection with sepsis  . Recurrent Foot Osteomyelitis 02/19/2012    Recurrent osteomyelitis of toes with multiple ray amputations Follows up with Dr Sharol Given      Assessment: 48yo male was unconscious w/ CBG of 37 with repeated rises and falls in CBG w/ treatment, also hypothermic rewarmed w/ bear hugger, found to have diabetic ulcer w/ prior h/o ulcers, cellulitis, and amputations, known to be noncompliant w/ DM treatment, no evidence of osteo on Xray, to begin IV ABX.  Goal of Therapy:  Vancomycin trough level 10-15 mcg/ml  Plan:  Will begin vancomycin 1000mg  IV Q12H and Zosyn 3.375g IV Q8H and monitor CBC, Cx, levels prn.  Wynona Neat, PharmD, BCPS  07/22/2013,6:39 AM

## 2013-07-22 NOTE — ED Notes (Signed)
Pt states he has had reoccurring infections to a diabetic foot ulcer to L foot stump. Foul odor noted from wound, tissue visible.

## 2013-07-22 NOTE — Consult Note (Addendum)
WOC wound consult note Reason for Consult: Consult requested for left stump wound.  Pt had previous surgery performed in 2014 by Dr Sharol Given and consult from his service has been requested.  Pt states wound reopened recently and began draining. X-ray does not indicate osteomyelitis; if further results are desired, then consider MRI. Wound type: Left leg stump with full thickness wound Measurement: 3X4cm, and 1X.5X.1cm to lower stump Wound bed: 10% red and moist, 90% dry eschar, and other site white with small amt serosanguinous drainage, no odor. Drainage (amount, consistency, odor) No odor or drainage at this time from anterior wound but both were reported to be present upon admission. Periwound: Intact skin surrounding wounds. Dressing procedure/placement/frequency: Moist gauze dressing applied until further input available from ortho service.  Wound could benefit from debridement of necrotic tissue; either sharp or chemical.  Please defer to ortho service for further assessment and plan of care.  Please re-consult if further assistance is needed.  Thank-you,  Julien Girt MSN, [place], [place], [place], [place]

## 2013-07-22 NOTE — ED Notes (Signed)
Informed patient that tray has been ordered.

## 2013-07-22 NOTE — ED Notes (Signed)
Contacted lab about lab draw.

## 2013-07-22 NOTE — ED Notes (Signed)
Patient reports that labs have just been drawn.

## 2013-07-22 NOTE — ED Notes (Signed)
Internal Medicine, Dr. Alice Rieger, at the bedside.

## 2013-07-22 NOTE — Progress Notes (Signed)
Patient's CBG=459. Patient on D10 @ 50cc/hr.Dr.Jones notified.Marland Kitchen

## 2013-07-22 NOTE — ED Notes (Signed)
Explained plan of care to the patient.

## 2013-07-22 NOTE — ED Notes (Signed)
Explained to the patient that CBG's will be done every 2 hours now.

## 2013-07-22 NOTE — Progress Notes (Signed)
Subjective: Patient seen at bedside this AM, significantly improved. Conversing clearly, no fatigue or lethargy, no symptoms of hypoglycemia at this time. CBG's now in the 200-300 range.   Left foot w/ area of erythema and small amount of discharge. Questionable region of fluctuance. Minimal amount of pain, however, patient w/ neuropathy.   No fever, chills, nausea, or vomiting.   Objective: Vital signs in last 24 hours: Filed Vitals:   07/22/13 0700 07/22/13 0730 07/22/13 0830 07/22/13 1400  BP: 144/83 147/79 135/66 165/92  Pulse: 95 93 87 84  Temp:   98.1 F (36.7 C) 98.6 F (37 C)  TempSrc:   Oral Oral  Resp: 18 20 20 20   Height:      Weight:      SpO2: 99% 97% 95% 100%   Weight change:   Intake/Output Summary (Last 24 hours) at 07/22/13 1614 Last data filed at 07/22/13 1300  Gross per 24 hour  Intake    490 ml  Output    550 ml  Net    -60 ml   Physical Exam: General: Alert, cooperative, NAD.  HEENT: PERRL, EOMI. Moist mucus membranes. Neck: Full range of motion without pain, supple, no lymphadenopathy or carotid bruits Lungs: Clear to ascultation bilaterally, normal work of respiration, no wheezes, rales, rhonchi Heart: RRR, no murmurs, gallops, or rubs Abdomen: Soft, non-tender, non-distended, BS + Extremities: Right BKA, clean stump, no erythema or drainage. Left partial foot amputation w/ small area of erythema and drainage closest to 1st toe. Mild amount of fluctuance.  Neurologic: Alert & oriented X3, cranial nerves II-XII intact, strength grossly intact, sensation intact to light touch  Lab Results: Basic Metabolic Panel:  Recent Labs Lab 07/22/13 0316  NA 140  K 3.8  CL 101  CO2 23  GLUCOSE 78  BUN 24*  CREATININE 1.34  CALCIUM 9.3   Liver Function Tests:  Recent Labs Lab 07/22/13 0316  AST 23  ALT 13  ALKPHOS 115  BILITOT 0.3  PROT 7.6  ALBUMIN 3.5   CBC:  Recent Labs Lab 07/22/13 0316  WBC 11.3*  NEUTROABS 9.0*  HGB 12.2*    HCT 35.7*  MCV 88.8  PLT 384   CBG:  Recent Labs Lab 07/22/13 0352 07/22/13 0509 07/22/13 0724 07/22/13 0820 07/22/13 1022 07/22/13 1206  GLUCAP 143* 150* 300* 459* 306* 258*   Hemoglobin A1C:  Recent Labs Lab 07/22/13 0925  HGBA1C 11.5*   Urinalysis:  Recent Labs Lab 07/22/13 0450  COLORURINE YELLOW  LABSPEC 1.023  PHURINE 5.0  GLUCOSEU NEGATIVE  HGBUR TRACE*  BILIRUBINUR NEGATIVE  KETONESUR NEGATIVE  PROTEINUR 100*  UROBILINOGEN 1.0  NITRITE NEGATIVE  LEUKOCYTESUR NEGATIVE   Studies/Results: Dg Foot Complete Left  07/22/2013   CLINICAL DATA:  Infection, diabetic foot, pain  EXAM: LEFT FOOT - COMPLETE 3+ VIEW  COMPARISON:  None.  FINDINGS: There is evidence of prior surgical resection of the left forefoot. There is soft tissue ulceration of the stump. There is no underlying bone destruction or periosteal reaction. There is no fracture or dislocation. There is no radiopaque foreign body.  IMPRESSION: 1. No radiographic evidence of osteomyelitis of the left foot. If there is further clinical concern recommend MRI or three-phase bone scan.   Electronically Signed   By: [name]   On: 07/22/2013 02:10   Medications: I have reviewed the patient's current medications. Scheduled Meds: . FLUoxetine  10 mg Oral Daily  . gabapentin  600 mg Oral TID  . heparin  subcutaneous  5,000 Units Subcutaneous 3 times per day  . insulin aspart  0-9 Units Subcutaneous TID WC  . insulin glargine  20 Units Subcutaneous QHS  . nicotine  14 mg Transdermal Daily  . pantoprazole  40 mg Oral Daily  . piperacillin-tazobactam (ZOSYN)  IV  3.375 g Intravenous Q8H  . vancomycin  1,000 mg Intravenous Q12H   Continuous Infusions:  PRN Meds:.acetaminophen  Assessment/Plan: [name] is a 48 y.o. male w/ PMHx of HTN, DM type II, CKD, PVD, GERD, g/o right BKA, and partial eft foot amputation, admitted for hypoglycemia and left foot cellulitis vs osteomyelitis.  Left Foot  Cellulitis- S/p ray amputation of the left foot in May 2014 by [name]. Admitted w/ mild leukocytosis of 11.3. Hypothermia resolved. XR not suggestive of osteomyelitis. On exam, discharge and cavitation in the area of 1st toe. Some mild pain and erythema, questionable fluctuance.  -MRI pending. -Discussed w/ Wesley. Sharol [name], will see patient for possible revision -Wound care consulted, appreciate recs. -Continue Vanc + Zosyn  DM type II w/ Hypoglycemia- Resolved. Patient with symptomatic hypoglycemia with blood sugars as low as 14. Resolved this AM s/p D10 @ 50 cc/hr. Patient feeling much better at this time. No symptoms of hypoglycemia. CBG's now in 200-300's. HbA1c repeated, shown to be 11.5. Needs controlled sugars in the setting of infection listed above.  -Lantus 20 units; may increase tomorrow (home dose 36 units) -ISS-S + CBG's AC/HS -Repeat HbA1c pending.  DVT/PE PPx- Heparin Greene  Dispo: Disposition is deferred at this time, awaiting improvement of current medical problems.  Anticipated discharge in approximately 2-3 day(s).   The patient does have a current PCP Wesley Avers, MD) and does need an [facility] hospital follow-up appointment after discharge.  The patient does not have transportation limitations that hinder transportation to clinic appointments.  .Services Needed at time of discharge: Y = Yes, Blank = No PT:   OT:   RN:   Equipment:   Other:     LOS: 0 days   Corky Sox, MD 07/22/2013, 4:14 PM

## 2013-07-22 NOTE — ED Notes (Signed)
Bear hugger applied due to low temp.

## 2013-07-22 NOTE — H&P (Signed)
  Date: 07/22/2013  Patient name: [name]  Medical record number: [id number]  Date of birth: July 18, 1965   I have seen and evaluated [name] and discussed their care with the Residency Team. [name] and his wife are well known to me when he was admitted for tx of RLE infected foot. He is now admitted with LLE infection and hypoglycemia. He and is wife do not know why he bc hypoglycemic other than he didn't eat a large dinner after injecting his Lantus. They deny confusing the lantus with short acting insulin. His does of Lantus, 36 units, is less than his calc basal rate so shouldn't cause hypoglycemia. He is unable to check his CBG's at home. He rarely uses the 2 units of short acting as even if his CBG is 200, he feels like his sugar bc too low after using it. He has poor Dm control. As for his foot, the L stump site opened up with increased drainage, occasional pain, but still able to walk on it. He has not seen the wound care center for the foot.   [place] has no running water or electricity. They no longer have [facility] and it is a very long walk to get to bus station. He is trying to get disability. He has had R AKA but using prosthesis OK.  Assessment and Plan: I have seen and evaluated the patient as outlined above. I agree with the formulated Assessment and Plan as detailed in the residents' admission note, with the following changes:   1. Hypoglycemia - resolved. Off D10 gtt. On home insulin. Follow CBG's  2. DM type II insulin dep, poorly controlled - really needs better DM control. I believe he might be feeling hypoglycemia even with nl / high CBG's so will need to learn to tolerate nl CBG's.  3. Foot wound - cont ABX. Get MRI. Consult Dr Sharol Given. He has +2 DP pulse of L. Needs betted DM control.   Wesley Crews, MD 4/14/20154:01 PM

## 2013-07-22 NOTE — ED Notes (Signed)
Called main lab to verify that all blood samples have been collected.  Samples are currently being run now for CMP, and both blood cultures are there.

## 2013-07-22 NOTE — ED Provider Notes (Signed)
CSN: [id number]     Arrival date & time 07/22/13  0114 History   First MD Initiated Contact with Patient 07/22/13 0135     Chief Complaint  Patient presents with  . Hypoglycemia     (Consider location/radiation/quality/duration/timing/severity/associated sxs/prior Treatment) HPI Patient was found unresponsive by EMS and had a blood sugar of 37 was given one amp of D50 and several sandwiches. Ago blood sugar initially rose it then dropped again to 40s. Initial CBG on arrival was 196 but then dropped to 70s. Patient states he's been noncompliant with his insulin for several days. He's concerned that his left foot may be infected. He denies having any new discharge or pain in the area. He denies fevers but admits to chills Past Medical History  Diagnosis Date  . Onychomycosis   . Hypertension   . Diabetic peripheral neuropathy   . Diabetic foot ulcer     s/p Right first ray amputation, left transmetatarsal amputation with revision  . History of drug abuse     Cocaine and marijuana  . Exposure to trichomonas     treated empirically  . Cellulitis of left foot 03/2008    left 4th and 5th metatarsal area  . Hyperlipidemia   . 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 Mallory [name] tear April 2009  . History of prolonged Q-T interval on ECG   . 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  . 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 CKD (chronic kidney disease) stage 2, GFR 60-89 ml/min     BL SCr 1.3-1.4  . Depression   . Peripheral vascular disease   . Type 2 diabetes mellitus, uncontrolled, with renal complications   . GERD (gastroesophageal reflux disease)   . 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 Toe amputation  06/2011    left; 4th toe  . Nephrectomy      L side  . I&d extremity  08/24/2011    Procedure: IRRIGATION AND DEBRIDEMENT EXTREMITY;  Surgeon: Meredith Pel, MD;  Location: [place];  Service: Orthopedics;  Laterality: Left;  . Amputation  08/24/2011    Procedure: AMPUTATION RAY;  Surgeon: Meredith Pel, MD;  Location: [place];  Service: Orthopedics;  Laterality: Left;  left fourth toe Ray Resection  . Amputation  12/05/2011    Procedure: AMPUTATION RAY;  Surgeon: Meredith Pel, MD;  Location: [place];  Service: Orthopedics;  Laterality: Left;  . I&d extremity  02/18/2012    Procedure: IRRIGATION AND DEBRIDEMENT EXTREMITY;  Surgeon: Mcarthur Rossetti, MD;  Location: [place];  Service: Orthopedics;  Laterality: Left;  . Amputation  02/18/2012    Procedure: AMPUTATION DIGIT;  Surgeon: Mcarthur Rossetti, MD;  Location: [place];  Service: Orthopedics;  Laterality: Left;  revison transmetatarsal  . I&d extremity Left 08/28/2012    Procedure: IRRIGATION AND DEBRIDEMENT EXTREMITY;  Surgeon: Linna Hoff, MD;  Location: [place];  Service: Orthopedics;  Laterality: Left;  CYSTO TUBING/IRRIGATION, LEAD HAND.  . I&d extremity Left 08/30/2012    Procedure: IRRIGATION AND DEBRIDEMENT  EXTREMITY;  Surgeon: Linna Hoff, MD;  Location: [place];  Service: Orthopedics;  Laterality: Left;  . Amputation Right 09/01/2012    Procedure: FOOT 1ST RAY AMPUTATION;  Surgeon: Newt Minion, MD;  Location: [place];  Service: Orthopedics;  Laterality: Right;  . Amputation Left 09/01/2012    Procedure: FOOT REVISION TRANSMETATARSAL AMPUTATION ;  Surgeon: Newt Minion, MD;  Location: [place];  Service: Orthopedics;  Laterality: Left;  . Amputation Right 09/13/2012    Procedure: AMPUTATION BELOW KNEE;  Surgeon: Newt Minion, MD;  Location: [place];  Service:  Orthopedics;  Laterality: Right;  Right Below Knee Amputation   Family History  Problem Relation Age of Onset  . Diabetes type II Mother   . Pancreatic cancer Father    History  Substance Use Topics  . Smoking status: Current Every Day Smoker -- 0.50 packs/day for 34 years    Types: Cigarettes  . Smokeless tobacco: Never Used     Comment: Has stopped 2 weeks ago- QUIT line info given to pt and wife for asst with smoking patchers, etc  . Alcohol Use: Yes     Comment: occ    Review of Systems  Constitutional: Positive for chills and fatigue. Negative for fever.  Respiratory: Negative for cough and shortness of breath.   Cardiovascular: Negative for chest pain.  Gastrointestinal: Negative for nausea, vomiting and abdominal pain.  Genitourinary: Negative for dysuria.  Musculoskeletal: Negative for back pain, neck pain and neck stiffness.  Skin: Positive for wound. Negative for rash.  Neurological: Positive for syncope. Negative for dizziness, weakness, light-headedness and headaches.  All other systems reviewed and are negative.     Allergies  Review of patient's allergies indicates no known allergies.  Home Medications   Current Outpatient Rx  Name  Route  Sig  Dispense  Refill  . acetaminophen (TYLENOL) 500 MG tablet   Oral   Take 1,000 mg by mouth every 8 (eight) hours as needed.         . gabapentin (NEURONTIN) 300 MG capsule   Oral   Take 2 capsules (600 mg total) by mouth 3 (three) times daily.   180 capsule   2   . insulin aspart (NOVOLOG) 100 UNIT/ML injection   Subcutaneous   Inject 3-5 Units into the skin 3 (three) times daily with meals. Blood sugar above 200 - take 3 units  Blood sugar above 300 - take 4 units  Blood sugar above 400 - take 5 units   1 vial   12   . insulin glargine (LANTUS) 100 UNIT/ML injection   Subcutaneous   Inject 36 Units into the skin at bedtime.         . pantoprazole (PROTONIX) 40 MG tablet   Oral   Take 1 tablet (40  mg total) by mouth daily.   90 tablet   11    BP 129/70  Pulse 85  Temp(Src) 97.7 F (36.5 C) (Oral)  Resp 17  Ht 5\' 8"  (1.727 m)  Wt 210 lb (95.255 kg)  BMI 31.94 kg/m2  SpO2 97% Physical Exam  Nursing note and vitals reviewed. Constitutional: He is oriented to person, place, and time. He appears well-developed and well-nourished. No distress.  HENT:  Head: Normocephalic and atraumatic.  Mouth/Throat: Oropharynx is clear and moist. No oropharyngeal exudate.  Eyes: EOM are normal. Pupils are equal, round, and reactive to light.  Neck: Normal range of motion. Neck supple.  No posterior midline cervical tenderness.  Cardiovascular: Normal rate and regular rhythm.   Pulmonary/Chest: Effort normal and breath sounds normal. No respiratory distress. He has no wheezes. He has no rales.  Abdominal: Soft. Bowel sounds are normal. He exhibits no distension and no mass. There is no tenderness. There is no rebound and no guarding.  Musculoskeletal: Normal range of motion. He exhibits no edema and no tenderness.  Right BKA. Left partial foot amputation with small open wound. There is no obvious discharge. There is no erythema or swelling.  Neurological: He is alert and oriented to person, place, and time.  Moves all extremities. Patient has peripheral neuropathy. He is awake and alert.  Skin: Skin is warm and dry. No rash noted. No erythema.  Psychiatric: He has a normal mood and affect. His behavior is normal.    ED Course  Procedures (including critical care time) Labs Review Labs Reviewed  CBC WITH DIFFERENTIAL - Abnormal; Notable for the following:    WBC 11.3 (*)    RBC 4.02 (*)    Hemoglobin 12.2 (*)    HCT 35.7 (*)    Neutrophils Relative % 80 (*)    Neutro Abs 9.0 (*)    All other components within normal limits  COMPREHENSIVE METABOLIC PANEL - Abnormal; Notable for the following:    BUN 24 (*)    GFR calc non Af Amer 62 (*)    GFR calc Af Amer 71 (*)    All other  components within normal limits  CBG MONITORING, ED - Abnormal; Notable for the following:    Glucose-Capillary 143 (*)    All other components within normal limits  CBG MONITORING, ED - Abnormal; Notable for the following:    Glucose-Capillary 150 (*)    All other components within normal limits  CULTURE, BLOOD (ROUTINE X 2)  CULTURE, BLOOD (ROUTINE X 2)  URINALYSIS, ROUTINE W REFLEX MICROSCOPIC  I-STAT CG4 LACTIC ACID, ED  CBG MONITORING, ED   Imaging Review Dg Foot Complete Left  07/22/2013   CLINICAL DATA:  Infection, diabetic foot, pain  EXAM: LEFT FOOT - COMPLETE 3+ VIEW  COMPARISON:  None.  FINDINGS: There is evidence of prior surgical resection of the left forefoot. There is soft tissue ulceration of the stump. There is no underlying bone destruction or periosteal reaction. There is no fracture or dislocation. There is no radiopaque foreign body.  IMPRESSION: 1. No radiographic evidence of osteomyelitis of the left foot. If there is further clinical concern recommend MRI or three-phase bone scan.   Electronically Signed   By: Kathreen Devoid   On: 07/22/2013 02:10     EKG Interpretation None      MDM   Final diagnoses:  Hypoglycemia  Hypothermia    The patient's blood sugars stable on D. 10. No known cause for hypoglycemia. He was found to be hypothermic in emergency Department warmed with a bear hugger. Discussed with internal medicine resident will see patient in emergency department.    Julianne Rice, MD 07/22/13 [phone]

## 2013-07-22 NOTE — ED Notes (Signed)
Dr. Yelverton at the bedside.  

## 2013-07-22 NOTE — ED Notes (Signed)
Per EMS- patient was unconscious on scene with glucose 37. Gave patient 1 amp, BS went up to 71. Pt ate 3 peanut butter sandwiches and 3 cokes, sugar dropped back to 49. Pt was diaphoretic. EMS began D10. CBG upon arrival was 196.

## 2013-07-22 NOTE — ED Notes (Signed)
Patient transported to X-ray 

## 2013-07-22 NOTE — ED Notes (Signed)
Clarified with Dr. Lita Mains, patient is not considered code sepsis. Will continue to monitor.

## 2013-07-22 NOTE — ED Notes (Signed)
Patient is back from X-ray.

## 2013-07-22 NOTE — ED Notes (Signed)
Patient is currently eating breakfast.

## 2013-07-22 NOTE — Progress Notes (Signed)
Inpatient Diabetes Program Recommendations   AACE/ADA: New Consensus Statement on Inpatient Glycemic Control (2013)  Target Ranges: Prepandial: less than 140 mg/dL  Peak postprandial: less than 180 mg/dL (1-2 hours)  Critically ill patients: 140 - 180 mg/dL   Referral received re "hypoglycemia". Spoke with patient and wife at length regarding his history of diabetes and various treatment regimens he has used.  Pt states he was put on one dose daily of Lantus at 36 units to cover all of his glucose needs. He states he can't always eat as he does not always have food to eat 3 meals per day. The 36 units of lantus is too much basal for him unless he eats constantly. He states he doesn't usually run low, except he was outside raking yesterday and that may have dropped his glucose as it did last night. Pt states he has lows in the mornings quite often and has to treat them with whatever he can which then can cause him to run high the rest of the day. He states he has used 70/30 in the past and that he liked it better. States he was on 20 units in the am and 40 units at supper and that he could take less if he wasn't going to eat.  This would be a better choice for this patient as he could almost be on a 'sliding scale' of sorts where he always takes some insulin but can alter the dose according to his blood sugar and what he has available to eat. If he is more active than usual, he could take less 70/30 before supper that day. Pt states he has to figure out a way to get to the clinic to get his strips. He has already been working with social worker, etc. Noted that all are aware of poor living situation. Thank you, Asad Keeven, RN, CNS, Diabetes Coordinator ([phone])       

## 2013-07-22 NOTE — H&P (Signed)
Date: 07/22/2013               Patient Name:  [name] MRN: [id number]  DOB: [birth year] Age / Sex: 48 y.o., male   PCP: [name], MD              Medical Service: Internal Medicine Teaching Service              Attending Physician: Dr. Julianne Rice, MD    First Contact: Dr. Ronnald Ramp Pager: [phone]  Second Contact: Dr. Eula Fried Pager: [phone]            After Hours (After 5p/  First Contact Pager: [phone]  weekends / holidays): Second Contact Pager: [phone]   Chief Complaint: Hypoglycemia and increased drainage from left foot.   History of Present Illness: 48 year old gentleman, with past medical history of uncontrolled type 2 diabetes, on insulin, hypertension, status right BKA, depression, and hyperlipidemia, presents via EMS after being found unconscious at home with hypoglycemia. EMS unsuccessfully attempted to resuscitate him with infusion of dextrose before they brought him to the emergency department. Per EMS- patient was unconscious on scene with glucose 37. Gave patient 1 amp, BS went up to 71. Pt ate 3 peanut butter sandwiches and 3 cokes, sugar dropped back to 49. Pt was diaphoretic. EMS began D10. CBG upon arrival was 196. He was also noticed to be hypothermic with a temperature of 93.3. Patient reports that he was in his usual state of health in the recent days. On the night of admission, his wife noticed that he was becoming confused, diaphoretic, and pale and the CBG was 14 at one point. Before EMS arrived, she had tried to give him crackers and juice, but his blood sugars remained below in the ranges of 30 to 40s. Patient reports that he had good dinner and had taken his usual insulin of Lantus 36 units before dinner but did not take his sliding scale insulin. Before Lantus administration his CBG was 200s. He denies recent fevers, chills, ranges, increased fatigue, or any other constitutional symptoms. However, the patient reports that over the past several months  his left foot amputation stump opened, and over the past several weeks he noticed increased drainage, and pain. He also notice foul smell to it. He has been attending the wound center with a foot infection is poorly progressed. He last saw Dr. Sharol Given in December 2014 who discharged him to the wound center for further care.  At the time of our evaluation, patient appear to be fully conscious and alert, and was able to provide some history supplemented by his wife.  Review of Systems: HEENT: Denies photophobia, eye pain, redness, hearing loss, ear pain, congestion, sore throat, rhinorrhea, sneezing, mouth sores, trouble swallowing, neck pain, neck stiffness and tinnitus.  Respiratory: Denies SOB, DOE, cough, chest tightness, and wheezing.  Cardiovascular: Denies chest pain, palpitations and leg swelling.  Gastrointestinal: Denies nausea, vomiting, abdominal pain, diarrhea, constipation, blood in stool and abdominal distention.  Genitourinary: Denies dysuria, urgency, frequency, hematuria, flank pain and difficulty urinating.  Musculoskeletal: He has been successfully using his right leg prosthetic without any problems. The stump site is well-healed and dry.  Neurological: Denies seizures, and headaches.  Hematological: Denies adenopathy. Easy bruising, personal or family bleeding history  Psychiatric/Behavioral: Denies suicidal ideation, mood changes, confusion, nervousness, sleep disturbance and agitation  Meds: 1. Tylenol 1000mg  every 8 hours. 2. Gabapentin 600 mg 3 times a day. 3. Lantus 36 units at bedtime. 4.  Insulin Aspart sliding scale 3-5 units 3 times daily. 5. Protonix 40 mg daily  Allergies: Allergies as of 07/22/2013  . (No Known Allergies)   Past Medical History  Diagnosis Date  . Onychomycosis   . Hypertension   . Diabetic peripheral neuropathy   . Diabetic foot ulcer     s/p Right first ray amputation, left transmetatarsal amputation with revision  . History of drug abuse       Cocaine and marijuana  . Exposure to trichomonas     treated empirically  . Cellulitis of left foot 03/2008    left 4th and 5th metatarsal area  . Hyperlipidemia   . 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 Mallory [name] tear April 2009  . History of prolonged Q-T interval on ECG   . 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  . 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 CKD (chronic kidney disease) stage 2, GFR 60-89 ml/min     BL SCr 1.3-1.4  . Depression   . Peripheral vascular disease   . Type 2 diabetes mellitus, uncontrolled, with renal complications   . GERD (gastroesophageal reflux disease)   . 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 Toe amputation  06/2011    left; 4th toe  . Nephrectomy      L side  . I&d extremity  08/24/2011    Procedure: IRRIGATION AND DEBRIDEMENT EXTREMITY;  Surgeon: Meredith Pel, MD;  Location: [place];  Service: Orthopedics;  Laterality: Left;  . Amputation  08/24/2011    Procedure: AMPUTATION RAY;  Surgeon: Meredith Pel, MD;  Location[place]  Service: Orthopedics;  Laterality: Left;  left fourth toe Ray Resection  . Amputation  12/05/2011    Procedure: AMPUTATION RAY;  Surgeon: Meredith Pel, MD;  Location: [place];  Service: Orthopedics;  Laterality: Left;  . I&d extremity  02/18/2012    Procedure: IRRIGATION AND DEBRIDEMENT EXTREMITY;  Surgeon: Mcarthur Rossetti, MD;  Location: [place];  Service: Orthopedics;  Laterality: Left;  . Amputation  02/18/2012    Procedure: AMPUTATION DIGIT;  Surgeon: Mcarthur Rossetti, MD;  Location: [place];  Service: Orthopedics;  Laterality: Left;  revison transmetatarsal  . I&d extremity Left 08/28/2012    Procedure: IRRIGATION AND DEBRIDEMENT EXTREMITY;  Surgeon: Linna Hoff, MD;  Location: [place];  Service: Orthopedics;  Laterality: Left;  CYSTO TUBING/IRRIGATION, LEAD HAND.  . I&d extremity Left 08/30/2012    Procedure: IRRIGATION AND DEBRIDEMENT EXTREMITY;  Surgeon: Linna Hoff, MD;  Location: [place];  Service: Orthopedics;  Laterality: Left;  . Amputation Right 09/01/2012    Procedure: FOOT 1ST RAY AMPUTATION;  Surgeon: Newt Minion, MD;  Location: [place];  Service: Orthopedics;  Laterality: Right;  . Amputation Left 09/01/2012    Procedure: FOOT REVISION TRANSMETATARSAL AMPUTATION ;  Surgeon: Newt Minion, MD;  Location: [place];  Service: Orthopedics;  Laterality: Left;  . Amputation Right 09/13/2012    Procedure: AMPUTATION BELOW KNEE;  Surgeon: Newt Minion, MD;  Location: [facility]  OR;  Service: Orthopedics;  Laterality: Right;  Right Below Knee Amputation   Family History  Problem Relation Age of Onset  . Diabetes type II Mother   . Pancreatic cancer Father    History   Social History  . Marital Status: Married    Spouse Name: N/A    Number of Children: N/A  . Years of Education: N/A   Occupational History  . Not on file.   Social History Main Topics  . Smoking status: Current Every Day Smoker -- 0.50 packs/day for 34 years    Types: Cigarettes  . Smokeless tobacco: Never Used     Comment: Has stopped 2 weeks ago- QUIT line info given to pt and wife for asst with smoking patchers, etc  . Alcohol Use: Yes     Comment: occ  . Drug Use: Yes    Special: Marijuana, Cocaine  . Sexual Activity: Yes   Other Topics Concern  . Not on file   Social History Narrative  . No narrative on file    Physical Exam: Filed Vitals:   07/22/13 0600  BP: 140/82  Pulse: 96  Temp:   Resp:    General: well developed, well nourished; no acute distressed,  cooperative with exam in a Bair Hugger Head: atraumatic, normocephalic,  Eye: pupils equal, round and reactive; sclera anicteric; normal conjunctiva  Nose/throat: oropharynx clear, moist mucous membranes, pink gums   Lungs/Chest wall: clear to auscultation bilaterally, normal work of breathing  Heart: normal rate and regular rhythm; no murmurs Pulses: radial pulses are 2+ and symmetric. Left dorsalis pedis difficult to appreciate.   Abdomen: Normal fullness, no rebound, guarding, or rigidity; normal bowel sounds; no masses or organomegaly  Skin: warm, dry, intact, normal turgor, no rashes  Extremities: right BKA with stump site well healed and dry. Status post ray amputation of the left foot. Amputation stump with increasing drainage of thick pus, but no visible erythema or increased warmth. Some foul smell noticed.     Neurologic: A&O X3, CN II - XII are grossly intact. Motor strength is 5/5 in the all extremities Psych: patient is alert and oriented, mood and affect are normal and congruent, thought content is normal without delusions, thought process is linear, speech is normal and non-pressured, behavior is normal  Lab results: Basic Metabolic Panel:  Recent Labs  07/22/13 0316  NA 140  K 3.8  CL 101  CO2 23  GLUCOSE 78  BUN 24*  CREATININE 1.34  CALCIUM 9.3   Liver Function Tests:  Recent Labs  07/22/13 0316  AST 23  ALT 13  ALKPHOS 115  BILITOT 0.3  PROT 7.6  ALBUMIN 3.5  CBC:  Recent Labs  07/22/13 0316  WBC 11.3*  NEUTROABS 9.0*  HGB 12.2*  HCT 35.7*  MCV 88.8  PLT 384   CBG:  Recent Labs  07/22/13 0131 07/22/13 0352 07/22/13 0509  GLUCAP 91 143* 150*     Urinalysis:  Recent Labs  07/22/13 0450  COLORURINE YELLOW  LABSPEC 1.023  PHURINE 5.0  GLUCOSEU NEGATIVE  HGBUR TRACE*  BILIRUBINUR NEGATIVE  KETONESUR NEGATIVE  PROTEINUR 100*  UROBILINOGEN 1.0  NITRITE NEGATIVE  LEUKOCYTESUR NEGATIVE     Imaging results:  Dg Foot Complete  Left  07/22/2013   CLINICAL DATA:  Infection, diabetic foot, pain  EXAM: LEFT FOOT - COMPLETE 3+ VIEW  COMPARISON:  None.  FINDINGS: There is evidence of prior surgical resection of the left forefoot. There is soft tissue ulceration of  the stump. There is no underlying bone destruction or periosteal reaction. There is no fracture or dislocation. There is no radiopaque foreign body.  IMPRESSION: 1. No radiographic evidence of osteomyelitis of the left foot. If there is further clinical concern recommend MRI or three-phase bone scan.   Electronically Signed   By: Kathreen Devoid   On: 07/22/2013 02:10    Other results: EKG: None  Assessment & Plan by Problem: Principal Problem:   Hypoglycemia Active Problems:   Diabetes Type 2, Uncontroled   S/P Right BKA    DIABETIC PERIPHERAL NEUROPATHY   CKD Stage 2   Depression   S/p partial amputation of left foot   Cellulitis   # Hypoglycemia: Patient with symptomatic hypoglycemia with blood sugars as low as 14. He is on insulin at home and he does not take any oral hypoglycemics. Reports regular meals and no recent changes in his medications. Most likely trigger of his hypoglycemic episode is likely sepsis due to cellulitis of the left foot. CBGs have improved with infusion of D10.  Plan  - Continue with D5 infusion at 50 cc per hour. - Monitor CBGs every 2 hours  - Hold home insulin - Monitor mental status closely - Allow by mouth intake with a carb modified diet - Once CBGs are more stable, patient can be restarted on his home insulin.  # Left foot suspected cellulitis and Sepsis: Status post ray amputation of the left foot in May 2014 by Dr Sharol Given. Patient has suspected to have sepsis resulting from cellulitis of the left foot stump site. Sepsis is a possibility in view of hypothermia, a mild leukocytosis, with relative neutrophilia and hypoglycemia episode, and slightly elevated lactic acid of 2.02. Blood pressure remained stable and without  tachycardia. Left foot. X-ray does not reveal osteomyelitis but MRI may be required.  Plan. - admit to medsurg - cont with bair hugger for hypothermia - blood culture were drawn before antibiotics -Will start broad-spectrum antibiotics to cover common organisms including MRSA, gram-negative and anaerobes. Will use Zosyn and vancomycin. - consider wound culture - not obtained. - Consider MRI in due to clinical concern of possible osteomyelitis. - Consult with Dr. Sharol Given of orthopedics for further evaluation of site infection. - Consult wound care  - PT/OT consult   # Uncontrolled Diabetes type 2:  Last hemoglobin A1c on 01/20/2013 was 11.1%. Patient's home regimen includes Lantus 36 units at bedtime, and NovoLog 3-5 units 3 times a day. He reports that his been compliant with his regimen, but has been unable to followup with outpatient clinic due to transportation difficulties.  Plan. - Hold home insulin for now in the setting of hypoglycemia  - check CBGs q2hr until stable - carb mod - Will obtain A1c   Dispo: Disposition is deferred at this time, awaiting improvement of current medical problems. Anticipated discharge in approximately 2-3 day(s).   The patient does have a current PCP [name], MD), therefore will be require OPC follow-up after discharge.   The patient does have transportation limitations that hinder transportation to clinic appointments.   Signed:  [name], MD PGY-1 Internal Medicine Teaching Service Pager: [phone] (7pm-7am) 07/22/2013, 5:57 AM

## 2013-07-22 NOTE — ED Notes (Signed)
Called and ordered tray for patient

## 2013-07-23 ENCOUNTER — Inpatient Hospital Stay (HOSPITAL_COMMUNITY): Payer: Medicaid Other

## 2013-07-23 DIAGNOSIS — E119 Type 2 diabetes mellitus without complications: Secondary | ICD-10-CM

## 2013-07-23 LAB — CBC
HCT: 34.8 % — ABNORMAL LOW (ref 39.0–52.0)
Hemoglobin: 11.7 g/dL — ABNORMAL LOW (ref 13.0–17.0)
MCH: 30.1 pg (ref 26.0–34.0)
MCHC: 33.6 g/dL (ref 30.0–36.0)
MCV: 89.5 fL (ref 78.0–100.0)
Platelets: 349 10*3/uL (ref 150–400)
RBC: 3.89 MIL/uL — AB (ref 4.22–5.81)
RDW: 12.8 % (ref 11.5–15.5)
WBC: 7.4 10*3/uL (ref 4.0–10.5)

## 2013-07-23 LAB — BASIC METABOLIC PANEL
BUN: 19 mg/dL (ref 6–23)
CALCIUM: 8.9 mg/dL (ref 8.4–10.5)
CO2: 21 mEq/L (ref 19–32)
CREATININE: 1.5 mg/dL — AB (ref 0.50–1.35)
Chloride: 101 mEq/L (ref 96–112)
GFR calc Af Amer: 62 mL/min — ABNORMAL LOW (ref >90)
GFR, EST NON AFRICAN AMERICAN: 54 mL/min — AB (ref >90)
GLUCOSE: 331 mg/dL — AB (ref 70–99)
Potassium: 4.3 mEq/L (ref 3.7–5.3)
Sodium: 138 mEq/L (ref 137–147)

## 2013-07-23 LAB — GLUCOSE, CAPILLARY
GLUCOSE-CAPILLARY: 201 mg/dL — AB (ref 70–99)
Glucose-Capillary: 275 mg/dL — ABNORMAL HIGH (ref 70–99)
Glucose-Capillary: 188 mg/dL — ABNORMAL HIGH (ref 70–99)

## 2013-07-23 MED ORDER — GADOBENATE DIMEGLUMINE 529 MG/ML IV SOLN
20.0000 mL | Freq: Once | INTRAVENOUS | Status: AC
  Administered 2013-07-23: 20 mL via INTRAVENOUS

## 2013-07-23 MED ORDER — COLLAGENASE 250 UNIT/GM EX OINT
TOPICAL_OINTMENT | Freq: Every day | CUTANEOUS | Status: DC
  Administered 2013-07-23: 10:00:00 via TOPICAL
  Filled 2013-07-23: qty 30

## 2013-07-23 NOTE — Progress Notes (Signed)
OT Cancellation Note  Patient Details Name: [name] MRN: [id number] DOB: [birth year]   Cancelled Treatment:    Reason Eval/Treat Not Completed: OT screened, no needs identified, will sign off  Mosetta Putt, [place] [phone] 07/23/2013, 11:01 AM

## 2013-07-23 NOTE — Consult Note (Signed)
Reason for Consult: Ulceration left foot status post transmetatarsal amputation rule out osteomyelitis. Referring Physician: Dr. Jenell Milliner is an 48 y.o. male.  HPI: Patient is a 48 year old gentleman type II diabetic with renal disease status post right transtibial amputation with a left midfoot amputation ulceration possible osteomyelitis.  Past Medical History  Diagnosis Date  . Onychomycosis   . Hypertension   . Diabetic peripheral neuropathy   . Diabetic foot ulcer     s/p Right first ray amputation, left transmetatarsal amputation with revision  . History of drug abuse     Cocaine and marijuana  . Exposure to trichomonas     treated empirically  . Cellulitis of left foot 03/2008    left 4th and 5th metatarsal area  . Hyperlipidemia   . 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 Mallory [name] tear April 2009  . History of prolonged Q-T interval on ECG   . 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  . 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 CKD (chronic kidney disease) stage 2, GFR 60-89 ml/min     BL SCr 1.3-1.4  . Depression   . Peripheral vascular disease   . Type 2 diabetes mellitus, uncontrolled, with renal complications   . GERD (gastroesophageal reflux disease)   . 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 Recurrent Foot Osteomyelitis 02/19/2012    Recurrent osteomyelitis of toes with multiple ray amputations Follows up with Dr Sharol Given     Past Surgical History  Procedure Laterality Date  . Toe amputation  06/2011    left; 4th toe  . Nephrectomy      L side  . I&d extremity  08/24/2011    Procedure: IRRIGATION AND DEBRIDEMENT EXTREMITY;  Surgeon: Meredith Pel, MD;  Location: [place];  Service: Orthopedics;  Laterality:  Left;  . Amputation  08/24/2011    Procedure: AMPUTATION RAY;  Surgeon: Meredith Pel, MD;  Location: [place];  Service: Orthopedics;  Laterality: Left;  left fourth toe Ray Resection  . Amputation  12/05/2011    Procedure: AMPUTATION RAY;  Surgeon: Meredith Pel, MD;  Location: [place];  Service: Orthopedics;  Laterality: Left;  . I&d extremity  02/18/2012    Procedure: IRRIGATION AND DEBRIDEMENT EXTREMITY;  Surgeon: Mcarthur Rossetti, MD;  Location: [place];  Service: Orthopedics;  Laterality: Left;  . Amputation  02/18/2012    Procedure: AMPUTATION DIGIT;  Surgeon: Mcarthur Rossetti, MD;  Location: [place];  Service: Orthopedics;  Laterality: Left;  revison transmetatarsal  . I&d extremity Left 08/28/2012    Procedure: IRRIGATION AND DEBRIDEMENT EXTREMITY;  Surgeon: Linna Hoff, MD;  Location: [place];  Service: Orthopedics;  Laterality: Left;  CYSTO TUBING/IRRIGATION, LEAD HAND.  . I&d extremity Left 08/30/2012    Procedure: IRRIGATION AND DEBRIDEMENT EXTREMITY;  Surgeon: Linna Hoff, MD;  Location: [place];  Service: Orthopedics;  Laterality: Left;  . Amputation Right 09/01/2012    Procedure: FOOT 1ST RAY AMPUTATION;  Surgeon: Newt Minion, MD;  Location: [place];  Service: Orthopedics;  Laterality: Right;  . Amputation Left 09/01/2012    Procedure: FOOT REVISION TRANSMETATARSAL AMPUTATION ;  Surgeon: Newt Minion, MD;  Location: [place];  Service:  Orthopedics;  Laterality: Left;  . Amputation Right 09/13/2012    Procedure: AMPUTATION BELOW KNEE;  Surgeon: Newt Minion, MD;  Location: [place];  Service: Orthopedics;  Laterality: Right;  Right Below Knee Amputation    Family History  Problem Relation Age of Onset  . Diabetes type II Mother   . Pancreatic cancer Father     Social History:  reports that he has been smoking Cigarettes.  He has a 17 pack-year smoking history. He has never used smokeless tobacco. He reports that he drinks alcohol. He reports that he uses illicit drugs  (Marijuana and Cocaine).  Allergies: No Known Allergies  Medications: I have reviewed the patient's current medications.  Results for orders placed during the hospital encounter of 07/22/13 (from the past 48 hour(s))  CBG MONITORING, ED     Status: None   Collection Time    07/22/13  1:31 AM      Result Value Ref Range   Glucose-Capillary 91  70 - 99 mg/dL  CBC WITH DIFFERENTIAL     Status: Abnormal   Collection Time    07/22/13  3:16 AM      Result Value Ref Range   WBC 11.3 (*) 4.0 - 10.5 K/uL   RBC 4.02 (*) 4.22 - 5.81 MIL/uL   Hemoglobin 12.2 (*) 13.0 - 17.0 g/dL   HCT 35.7 (*) 39.0 - 52.0 %   MCV 88.8  78.0 - 100.0 fL   MCH 30.3  26.0 - 34.0 pg   MCHC 34.2  30.0 - 36.0 g/dL   RDW 12.7  11.5 - 15.5 %   Platelets 384  150 - 400 K/uL   Neutrophils Relative % 80 (*) 43 - 77 %   Neutro Abs 9.0 (*) 1.7 - 7.7 K/uL   Lymphocytes Relative 12  12 - 46 %   Lymphs Abs 1.3  0.7 - 4.0 K/uL   Monocytes Relative 7  3 - 12 %   Monocytes Absolute 0.8  0.1 - 1.0 K/uL   Eosinophils Relative 1  0 - 5 %   Eosinophils Absolute 0.1  0.0 - 0.7 K/uL   Basophils Relative 0  0 - 1 %   Basophils Absolute 0.0  0.0 - 0.1 K/uL  COMPREHENSIVE METABOLIC PANEL     Status: Abnormal   Collection Time    07/22/13  3:16 AM      Result Value Ref Range   Sodium 140  137 - 147 mEq/L   Potassium 3.8  3.7 - 5.3 mEq/L   Chloride 101  96 - 112 mEq/L   CO2 23  19 - 32 mEq/L   Glucose, Bld 78  70 - 99 mg/dL   BUN 24 (*) 6 - 23 mg/dL   Creatinine, Ser 1.34  0.50 - 1.35 mg/dL   Calcium 9.3  8.4 - 10.5 mg/dL   Total Protein 7.6  6.0 - 8.3 g/dL   Albumin 3.5  3.5 - 5.2 g/dL   AST 23  0 - 37 U/L   ALT 13  0 - 53 U/L   Alkaline Phosphatase 115  39 - 117 U/L   Total Bilirubin 0.3  0.3 - 1.2 mg/dL   GFR calc non Af Amer 62 (*) >90 mL/min   GFR calc Af Amer 71 (*) >90 mL/min   Comment: (NOTE)     The eGFR has been calculated using the CKD EPI equation.     This calculation has not been validated in all clinical  situations.     eGFR's persistently <90 mL/min signify possible Chronic Kidney     Disease.  I-STAT CG4 LACTIC ACID, ED     Status: None   Collection Time    07/22/13  3:29 AM      Result Value Ref Range   Lactic Acid, Venous 2.05  0.5 - 2.2 mmol/L  CBG MONITORING, ED     Status: Abnormal   Collection Time    07/22/13  3:52 AM      Result Value Ref Range   Glucose-Capillary 143 (*) 70 - 99 mg/dL  URINALYSIS, ROUTINE W REFLEX MICROSCOPIC     Status: Abnormal   Collection Time    07/22/13  4:50 AM      Result Value Ref Range   Color, Urine YELLOW  YELLOW   APPearance CLEAR  CLEAR   Specific Gravity, Urine 1.023  1.005 - 1.030   pH 5.0  5.0 - 8.0   Glucose, UA NEGATIVE  NEGATIVE mg/dL   Hgb urine dipstick TRACE (*) NEGATIVE   Bilirubin Urine NEGATIVE  NEGATIVE   Ketones, ur NEGATIVE  NEGATIVE mg/dL   Protein, ur 100 (*) NEGATIVE mg/dL   Urobilinogen, UA 1.0  0.0 - 1.0 mg/dL   Nitrite NEGATIVE  NEGATIVE   Leukocytes, UA NEGATIVE  NEGATIVE  URINE MICROSCOPIC-ADD ON     Status: None   Collection Time    07/22/13  4:50 AM      Result Value Ref Range   WBC, UA 0-2  <3 WBC/hpf   RBC / HPF 3-6  <3 RBC/hpf   Bacteria, UA RARE  RARE  CBG MONITORING, ED     Status: Abnormal   Collection Time    07/22/13  5:09 AM      Result Value Ref Range   Glucose-Capillary 150 (*) 70 - 99 mg/dL  CBG MONITORING, ED     Status: Abnormal   Collection Time    07/22/13  7:24 AM      Result Value Ref Range   Glucose-Capillary 300 (*) 70 - 99 mg/dL  GLUCOSE, CAPILLARY     Status: Abnormal   Collection Time    07/22/13  8:20 AM      Result Value Ref Range   Glucose-Capillary 459 (*) 70 - 99 mg/dL   Comment 1 Notify RN     Comment 2 Documented in Chart    HEMOGLOBIN A1C     Status: Abnormal   Collection Time    07/22/13  9:25 AM      Result Value Ref Range   Hemoglobin A1C 11.5 (*) <5.7 %   Comment: (NOTE)                                                                               According  to the ADA Clinical Practice Recommendations for 2011, when     HbA1c is used as a screening test:      >=6.5%   Diagnostic of Diabetes Mellitus               (if abnormal result is confirmed)     5.7-6.4%   Increased risk of developing Diabetes Mellitus  References:Diagnosis and Classification of Diabetes Mellitus,Diabetes     LGXQ,1194,17(EYCXK 1):S62-S69 and Standards of Medical Care in             Diabetes - 2011,Diabetes GYJE,5631,49 (Suppl 1):S11-S61.   Mean Plasma Glucose 283 (*) <117 mg/dL   Comment: Performed at [place], CAPILLARY     Status: Abnormal   Collection Time    07/22/13 10:22 AM      Result Value Ref Range   Glucose-Capillary 306 (*) 70 - 99 mg/dL  GLUCOSE, CAPILLARY     Status: Abnormal   Collection Time    07/22/13 12:06 PM      Result Value Ref Range   Glucose-Capillary 258 (*) 70 - 99 mg/dL  GLUCOSE, CAPILLARY     Status: Abnormal   Collection Time    07/22/13  4:47 PM      Result Value Ref Range   Glucose-Capillary 338 (*) 70 - 99 mg/dL   Comment 1 Documented in Chart     Comment 2 Notify RN    GLUCOSE, CAPILLARY     Status: Abnormal   Collection Time    07/22/13  9:59 PM      Result Value Ref Range   Glucose-Capillary 307 (*) 70 - 99 mg/dL  CBC     Status: Abnormal   Collection Time    07/23/13  5:34 AM      Result Value Ref Range   WBC 7.4  4.0 - 10.5 K/uL   RBC 3.89 (*) 4.22 - 5.81 MIL/uL   Hemoglobin 11.7 (*) 13.0 - 17.0 g/dL   HCT 34.8 (*) 39.0 - 52.0 %   MCV 89.5  78.0 - 100.0 fL   MCH 30.1  26.0 - 34.0 pg   MCHC 33.6  30.0 - 36.0 g/dL   RDW 12.8  11.5 - 15.5 %   Platelets 349  150 - 400 K/uL    Dg Foot Complete Left  07/22/2013   CLINICAL DATA:  Infection, diabetic foot, pain  EXAM: LEFT FOOT - COMPLETE 3+ VIEW  COMPARISON:  None.  FINDINGS: There is evidence of prior surgical resection of the left forefoot. There is soft tissue ulceration of the stump. There is no underlying bone destruction or periosteal  reaction. There is no fracture or dislocation. There is no radiopaque foreign body.  IMPRESSION: 1. No radiographic evidence of osteomyelitis of the left foot. If there is further clinical concern recommend MRI or three-phase bone scan.   Electronically Signed   By: Kathreen Devoid   On: 07/22/2013 02:10    Review of Systems  All other systems reviewed and are negative.  Blood pressure 166/96, pulse 79, temperature 98.3 F (36.8 C), temperature source Oral, resp. rate 19, height _0  (1.727 m), weight 96.843 kg (213 lb 8 oz), SpO2 100.00%. Physical Exam On examination patient does not have a strong palpable dorsalis pedis pulse. There is a stable midfoot amputation his foot is plantigrade. There is an ulcer over the plantar aspect of the foot with hypertrophic callus. The ulcers 10 mm in diameter 5 mm deep. Review the radiographs shows no evidence of a chronic osteomyelitis. Assessment/Plan: Assessment: Diabetic insensate neuropathy left midfoot amputation with ulceration rule out osteomyelitis.  Plan: We'll start Santyl dressing changes daily will obtain MRI scan to further evaluate the left foot. Plan for wound debridement in the office after discharge.  Newt Minion 07/23/2013, 6:47 AM

## 2013-07-23 NOTE — Progress Notes (Signed)
PT Cancellation Note  Patient Details Name: [name] MRN: [id number] DOB: 08/06/1965   Cancelled Treatment:    Reason Eval/Treat Not Completed: PT screened, no needs identified, will sign off; patient reported up walking without device, but not lately.  Wife in room and pt plans to do today.  Does not feel he needs PT.  Encouraged them to ask MD if any needs arise.   Max Sane 07/23/2013, 1:32 PM

## 2013-07-23 NOTE — Progress Notes (Signed)
Pt gone down to MRI via bed

## 2013-07-23 NOTE — Progress Notes (Signed)
Inpatient Diabetes Program Recommendations   AACE/ADA: New Consensus Statement on Inpatient Glycemic Control (2013)  Target Ranges: Prepandial: less than 140 mg/dL  Peak postprandial: less than 180 mg/dL (1-2 hours)  Critically ill patients: 140 - 180 mg/dL   Referral received re "hypoglycemia". Spoke with patient and wife at length regarding his history of diabetes and various treatment regimens he has used.  Pt states he was put on one dose daily of Lantus at 36 units to cover all of his glucose needs. He states he can't always eat as he does not always have food to eat 3 meals per day. The 36 units of lantus is too much basal for him unless he eats constantly. He states he doesn't usually run low, except he was outside raking yesterday and that may have dropped his glucose as it did last night. Pt states he has lows in the mornings quite often and has to treat them with whatever he can which then can cause him to run high the rest of the day. He states he has used 70/30 in the past and that he liked it better. States he was on 20 units in the am and 40 units at supper and that he could take less if he wasn't going to eat.  This would be a better choice for this patient as he could almost be on a 'sliding scale' of sorts where he always takes some insulin but can alter the dose according to his blood sugar and what he has available to eat. If he is more active than usual, he could take less 70/30 before supper that day. Pt states he has to figure out a way to get to the clinic to get his strips. He has already been working with Education officer, museum, etc. Noted that all are aware of poor living situation. Thank you, Rosita Kea, RN, CNS, Diabetes Coordinator [phone])

## 2013-07-23 NOTE — Progress Notes (Signed)
Subjective: Patient seen at bedside this AM, no significant complaints. Says he is feeling better today, still with mild foot pain. No other issues.   Objective: Vital signs in last 24 hours: Filed Vitals:   07/22/13 1704 07/22/13 2201 07/23/13 0410 07/23/13 0855  BP: 170/91 153/83 166/96 145/86  Pulse: 88 84 79 84  Temp: 98.4 F (36.9 C) 97.9 F (36.6 C) 98.3 F (36.8 C) 98.1 F (36.7 C)  TempSrc: Oral Oral Oral Oral  Resp: 20 18 19 20   Height:      Weight:  213 lb 8 oz (96.843 kg)    SpO2: 100% 100% 100% 100%   Weight change: 3 lb 8 oz (1.588 kg)  Intake/Output Summary (Last 24 hours) at 07/23/13 1151 Last data filed at 07/23/13 0900  Gross per 24 hour  Intake   1810 ml  Output   2500 ml  Net   -690 ml   Physical Exam: General: Alert, cooperative, NAD.  HEENT: PERRL, EOMI. Moist mucus membranes. Neck: Full range of motion without pain, supple, no lymphadenopathy or carotid bruits Lungs: Clear to ascultation bilaterally, normal work of respiration, no wheezes, rales, rhonchi Heart: RRR, no murmurs, gallops, or rubs Abdomen: Soft, non-tender, non-distended, BS + Extremities: Right BKA, clean stump, no erythema or drainage. Left partial foot amputation still w/ small area of erythema and serosanguinous drainage closest to 1st toe. Mild amount of fluctuance. Neurologic: Alert & oriented X3, cranial nerves II-XII intact, strength grossly intact, sensation intact to light touch  Lab Results: Basic Metabolic Panel:  Recent Labs Lab 07/22/13 0316 07/23/13 0534  NA 140 138  K 3.8 4.3  CL 101 101  CO2 23 21  GLUCOSE 78 331*  BUN 24* 19  CREATININE 1.34 1.50*  CALCIUM 9.3 8.9   Liver Function Tests:  Recent Labs Lab 07/22/13 0316  AST 23  ALT 13  ALKPHOS 115  BILITOT 0.3  PROT 7.6  ALBUMIN 3.5   CBC:  Recent Labs Lab 07/22/13 0316 07/23/13 0534  WBC 11.3* 7.4  NEUTROABS 9.0*  --   HGB 12.2* 11.7*  HCT 35.7* 34.8*  MCV 88.8 89.5  PLT 384 349    CBG:  Recent Labs Lab 07/22/13 0724 07/22/13 0820 07/22/13 1022 07/22/13 1206 07/22/13 1647 07/22/13 2159  GLUCAP 300* 459* 306* 258* 338* 307*   Hemoglobin A1C:  Recent Labs Lab 07/22/13 0925  HGBA1C 11.5*   Urinalysis:  Recent Labs Lab 07/22/13 0450  COLORURINE YELLOW  LABSPEC 1.023  PHURINE 5.0  GLUCOSEU NEGATIVE  HGBUR TRACE*  BILIRUBINUR NEGATIVE  KETONESUR NEGATIVE  PROTEINUR 100*  UROBILINOGEN 1.0  NITRITE NEGATIVE  LEUKOCYTESUR NEGATIVE   Studies/Results: Dg Foot Complete Left  07/22/2013   CLINICAL DATA:  Infection, diabetic foot, pain  EXAM: LEFT FOOT - COMPLETE 3+ VIEW  COMPARISON:  None.  FINDINGS: There is evidence of prior surgical resection of the left forefoot. There is soft tissue ulceration of the stump. There is no underlying bone destruction or periosteal reaction. There is no fracture or dislocation. There is no radiopaque foreign body.  IMPRESSION: 1. No radiographic evidence of osteomyelitis of the left foot. If there is further clinical concern recommend MRI or three-phase bone scan.   Electronically Signed   By: Kathreen Devoid   On: 07/22/2013 02:10   Medications: I have reviewed the patient's current medications. Scheduled Meds: . collagenase   Topical Daily  . FLUoxetine  10 mg Oral Daily  . gabapentin  600 mg Oral TID  .  heparin subcutaneous  5,000 Units Subcutaneous 3 times per day  . insulin aspart  0-15 Units Subcutaneous TID WC  . insulin glargine  30 Units Subcutaneous QHS  . nicotine  14 mg Transdermal Daily  . pantoprazole  40 mg Oral Daily  . piperacillin-tazobactam (ZOSYN)  IV  3.375 g Intravenous Q8H  . vancomycin  1,000 mg Intravenous Q12H   Continuous Infusions:  PRN Meds:.acetaminophen  Assessment/Plan: [name] is a 48 y.o. male w/ PMHx of HTN, DM type II, CKD, PVD, GERD, g/o right BKA, and partial eft foot amputation, admitted for hypoglycemia and left foot cellulitis vs osteomyelitis.  Left Foot  Cellulitis- S/p ray amputation of the left foot in May 2014 by Dr Sharol Given. Admitted w/ mild leukocytosis of 11.3, since resolved. Hypothermia resolved. XR not suggestive of osteomyelitis. Seen by Dr. Sharol Given this AM, will see in outpatient setting for debridement if MRI is negative.  -MRI today -Continue Vanc + Zosyn -Will discharge today if MRI not suggestive of osteomyelitis.  DM type II w/ Hypoglycemia- Resolved. Now with hyperglycemia. Patient w/ HbA1c 11.5. Poorly controlled CBG's at home 2/2 poor insulin compliance as the patient says he does not frequently check his blood sugars. Also claims that he has symptoms of hypoglycemia when his sugars are <200.  -Increased Lantus to 30 units -ISS-M + CBG's AC/HS -Will consider switching to 70/30 insulin on discharge w/ close follow up.  DVT/PE PPx- Heparin [place]  Dispo: Disposition is deferred at this time, awaiting improvement of current medical problems.  Anticipated discharge in approximately 1-2 day(s).   The patient does have a current PCP [name], MD) and does need an [facility] hospital follow-up appointment after discharge.  The patient does not have transportation limitations that hinder transportation to clinic appointments.  .Services Needed at time of discharge: Y = Yes, Blank = No PT:   OT:   RN:   Equipment:   Other:     LOS: 1 day   Corky Sox, MD 07/23/2013, 11:51 AM

## 2013-07-24 DIAGNOSIS — L02419 Cutaneous abscess of limb, unspecified: Secondary | ICD-10-CM

## 2013-07-24 DIAGNOSIS — L03119 Cellulitis of unspecified part of limb: Secondary | ICD-10-CM

## 2013-07-24 LAB — GLUCOSE, CAPILLARY
GLUCOSE-CAPILLARY: 279 mg/dL — AB (ref 70–99)
GLUCOSE-CAPILLARY: 184 mg/dL — AB (ref 70–99)
Glucose-Capillary: 188 mg/dL — ABNORMAL HIGH (ref 70–99)

## 2013-07-24 MED ORDER — PANTOPRAZOLE SODIUM 40 MG PO TBEC: 40.0000 mg | DELAYED_RELEASE_TABLET | Freq: Every day | ORAL | Status: DC

## 2013-07-24 MED ORDER — GABAPENTIN 300 MG PO CAPS: 600.0000 mg | ORAL_CAPSULE | Freq: Three times a day (TID) | ORAL | Status: DC

## 2013-07-24 MED ORDER — INSULIN ASPART 100 UNIT/ML ~~LOC~~ SOLN: 3.0000 [IU] | Freq: Three times a day (TID) | SUBCUTANEOUS | Status: DC

## 2013-07-24 MED ORDER — FLUOXETINE HCL 10 MG PO CAPS: 10.0000 mg | ORAL_CAPSULE | Freq: Every day | ORAL | Status: DC

## 2013-07-24 MED ORDER — CEPHALEXIN 500 MG PO CAPS: 500.0000 mg | ORAL_CAPSULE | Freq: Two times a day (BID) | ORAL | Status: DC

## 2013-07-24 MED ORDER — INSULIN GLARGINE 100 UNIT/ML ~~LOC~~ SOLN: 36.0000 [IU] | Freq: Every day | SUBCUTANEOUS | Status: DC

## 2013-07-24 NOTE — Discharge Instructions (Signed)
1. You have the following appointments scheduled:  [name]  On 07/31/2013 1:15 PM  [name] [place] [address] [phone]  [name]  On 07/28/2013 2:00 PM  [place] [address] [phone]  2. Please take all medications as prescribed. Please take Keflex 500 mg twice daily for 7 days (07/31/13 end date).  3. If you have worsening of your symptoms or new symptoms arise, please call the clinic ([phone]), or go to the ER immediately if symptoms are severe.     Hypoglycemia (Low Blood Sugar) Hypoglycemia is when the glucose (sugar) in your blood is too low. Hypoglycemia can happen for many reasons. It can happen to people with or without diabetes. Hypoglycemia can develop quickly and can be a medical emergency.  CAUSES  Having hypoglycemia does not mean that you will develop diabetes. Different causes include:  Missed or delayed meals or not enough carbohydrates eaten.  Medication overdose. This could be by accident or deliberate. If by accident, your medication may need to be adjusted or changed.  Exercise or increased activity without adjustments in carbohydrates or medications.  A nerve disorder that affects body functions like your heart rate, blood pressure and digestion (autonomic neuropathy).  A condition where the stomach muscles do not function properly (gastroparesis). Therefore, medications may not absorb properly.  The inability to recognize the signs of hypoglycemia (hypoglycemic unawareness).  Absorption of insulin  may be altered.  Alcohol consumption.  Pregnancy/menstrual cycles/postpartum. This may be due to hormones.  Certain kinds of tumors. This is very rare. SYMPTOMS   Sweating.  Hunger.  Dizziness.  Blurred vision.  Drowsiness.  Weakness.  Headache.  Rapid heart beat.  Shakiness.  Nervousness. DIAGNOSIS  Diagnosis is made by monitoring blood glucose in one or all of the following  ways:  Fingerstick blood glucose monitoring.  Laboratory results. TREATMENT  If you think your blood glucose is low:  Check your blood glucose, if possible. If it is less than 70 mg/dl, take one of the following:  3-4 glucose tablets.   cup juice (prefer clear like apple).   cup "regular" soda pop.  1 cup milk.  -1 tube of glucose gel.  5-6 hard candies.  Do not over treat because your blood glucose (sugar) will only go too high.  Wait 15 minutes and recheck your blood glucose. If it is still less than 70 mg/dl (or below your target range), repeat treatment.  Eat a snack if it is more than one hour until your next meal. Sometimes, your blood glucose may go so low that you are unable to treat yourself. You may need someone to help you. You may even pass out or be unable to swallow. This may require you to get an injection of glucagon, which raises the blood glucose. HOME CARE INSTRUCTIONS  Check blood glucose as recommended by your caregiver.  Take medication as prescribed by your caregiver.  Follow your meal plan. Do not skip meals. Eat on time.  If you are going to drink alcohol, drink it only with meals.  Check your blood glucose before driving.  Check your blood glucose before and after exercise. If you exercise longer or different than usual, be sure to check blood glucose more frequently.  Always carry treatment with you. Glucose tablets are the easiest to carry.  Always wear medical alert jewelry or carry some form of identification that states that you have diabetes. This will alert people that you have  diabetes. If you have hypoglycemia, they will have a better idea on what to do. SEEK MEDICAL CARE IF:   You are having problems keeping your blood sugar at target range.  You are having frequent episodes of hypoglycemia.  You feel you might be having side effects from your medicines.  You have symptoms of an illness that is not improving after 3-4  days.  You notice a change in vision or a new problem with your vision. SEEK IMMEDIATE MEDICAL CARE IF:   You are a family member or friend of a person whose blood glucose goes below 70 mg/dl and is accompanied by:  Confusion.  A change in mental status.  The inability to swallow.  Passing out. Document Released: 03/27/2005 Document Revised: 06/19/2011 Document Reviewed: 07/24/2011 [facility] Patient Information 2014 [place], [place].

## 2013-07-24 NOTE — Care Management Note (Signed)
   CARE MANAGEMENT NOTE 07/24/2013  Patient:  [name], [name]   Account Number:  [id number]  Date Initiated:  07/24/2013  Documentation initiated by:  Jasmine Pang  Subjective/Objective Assessment:   Referral by MD to arrange appointment at [facility].     Action/Plan:   07/24/13 Call placed to wound center at 1214, await return call to schedule appointment.   Anticipated DC Date:  07/24/2013   Anticipated DC Plan:  HOME/SELF CARE         Choice offered to / List presented to:             Status of service:  Completed, signed off Medicare Important Message given?   (If response is "NO", the following Medicare IM given date fields will be blank) Date Medicare IM given:   Date Additional Medicare IM given:    Discharge Disposition:  HOME/SELF CARE  Per UR Regulation:    If discussed at Long Length of Stay Meetings, dates discussed:    Comments:  07/24/13 Call from Dr Ronnald Ramp requesting CM to schedule an appointment at the [place] for this pt to receive onging wound care. Call placed to that facility, await return call. Order for [facility] to be used only is pt is unable to return to the wound care center, as explained to this case manager. Jasmine Pang RN MPH, case manager, [phone]

## 2013-07-24 NOTE — Discharge Summary (Signed)
Name: [name] MRN: [id number] DOB: February 11, 1966 48 y.o. PCP: [name], MD  Date of Admission: 07/22/2013  1:14 AM Date of Discharge: 07/24/2013 Attending Physician: Dominic Pea, DO  Discharge Diagnosis: 1. Hypoglycemia w/ Uncontrolled DM Type II 2. LLE Cellulitis  Discharge Medications:   Medication List         acetaminophen 500 MG tablet  Commonly known as:  TYLENOL  Take 1,000 mg by mouth every 8 (eight) hours as needed.     cephALEXin 500 MG capsule  Commonly known as:  KEFLEX  Take 1 capsule (500 mg total) by mouth 2 (two) times daily.     FLUoxetine 10 MG capsule  Commonly known as:  PROZAC  Take 1 capsule (10 mg total) by mouth daily.     gabapentin 300 MG capsule  Commonly known as:  NEURONTIN  Take 2 capsules (600 mg total) by mouth 3 (three) times daily.     insulin aspart 100 UNIT/ML injection  Commonly known as:  NOVOLOG  Inject 3-5 Units into the skin 3 (three) times daily with meals. CBG > 300 3 units, > 400 4 units, > 500 5 units     insulin glargine 100 UNIT/ML injection  Commonly known as:  LANTUS  Inject 0.36 mLs (36 Units total) into the skin at bedtime.     pantoprazole 40 MG tablet  Commonly known as:  PROTONIX  Take 1 tablet (40 mg total) by mouth daily.        Disposition and follow-up:   [name] was discharged from [facility] in Good condition.  At the hospital follow up visit please address:  1.  Glycemic Control; Patient presented to ED w/ hypoglycemia in the 20-30 range. Patient has a /ho uncontrolled DM, HbA1c >11. Discharged on Lantus 36 units + ISS decided by PCP prior to admission. Discussed proper use of sliding scale, importance of checking blood sugars, and importance of eating after use of Novolog. Is patient having difficulty w/ high blood sugars? Symptoms of hypoglycemia? May need to be switched to 70/30 for better control, compliance, and cost-effectiveness.   Left foot cellulitis;  Patient w/ non-healing lesion on previous amputation site, both CXR and MRI not suggestive of osteomyelitis. Sent home w/ Keflex for full course of Antibiotics. Healing lesion s/p outpatient debridement debridement w/ Dr. Sharol Given?  2.  Labs / imaging needed at time of follow-up: BMP, CBC  3.  Pending labs/ test needing follow-up: none  Follow-up Appointments:     Follow-up Information   Follow up with Blain Pais, MD On 07/31/2013. (1:15 PM)    Specialty:  Internal Medicine   Contact information:   [place] [address] [phone]       Follow up with Newt Minion, MD On 07/28/2013. (2:00 PM)    Specialty:  Orthopedic Surgery   Contact information:   [place] [address] [phone]       Discharge Instructions: Discharge Orders   Future Appointments Provider Department Dept Phone   07/31/2013 1:15 PM Blain Pais, MD [place] [phone]   Future Orders Complete By Expires   Call MD for:  persistant dizziness or light-headedness  As directed    Call MD for:  redness, tenderness, or signs of infection (pain, swelling, redness, odor or green/yellow discharge around incision site)  As directed    Call MD for:  temperature >100.4  As directed  Consultations:  Ortho  Procedures Performed:  Mr Foot Left W Wo Contrast  07/24/2013   CLINICAL DATA:  Recurrent soft tissue ulceration status post left midfoot amputation. Question recurrent osteomyelitis.  EXAM: MRI OF THE LEFT FOREFOOT WITHOUT AND WITH CONTRAST  TECHNIQUE: Multiplanar, multisequence MR imaging was performed both before and after administration of intravenous contrast.  CONTRAST:  73mL MULTIHANCE GADOBENATE DIMEGLUMINE 529 MG/ML IV SOLN  COMPARISON:  Radiographs 07/22/2013.  MRI 02/18/2012.  FINDINGS: Since the prior MRI, the patient has undergone forefoot amputation through the bases of all the metatarsals. There is mild nonspecific  marrow T2 hyperintensity and enhancement within the second metatarsal adjacent to the amputation site. Lesser T2 signal is present within the third metatarsal. There is no cortical destruction or adjacent soft tissue abnormality. The additional remaining metatarsal bases appear normal.  The bones of the hindfoot and midfoot appear normal. The alignment is normal at the Lisfranc joint.  There is skin thickening over the amputation site. Mild underlying soft tissue edema and enhancement is demonstrated. No focal fluid collection or sinus tract is identified.  IMPRESSION: 1. Mild nonspecific marrow T2 hyperintensity and enhancement within the remaining bases of the second and third metatarsals status post interval forefoot transmetatarsal amputation. No cortical destruction or adjacent focal soft tissue abnormality is demonstrated to strongly suggest recurrent osteomyelitis. 2. No evidence of soft tissue abscess or sinus tract.   Electronically Signed   By: Camie Patience M.D.   On: 07/24/2013 08:29   Dg Foot Complete Left  07/22/2013   CLINICAL DATA:  Infection, diabetic foot, pain  EXAM: LEFT FOOT - COMPLETE 3+ VIEW  COMPARISON:  None.  FINDINGS: There is evidence of prior surgical resection of the left forefoot. There is soft tissue ulceration of the stump. There is no underlying bone destruction or periosteal reaction. There is no fracture or dislocation. There is no radiopaque foreign body.  IMPRESSION: 1. No radiographic evidence of osteomyelitis of the left foot. If there is further clinical concern recommend MRI or three-phase bone scan.   Electronically Signed   By: Kathreen Devoid   On: 07/22/2013 02:10   Admission HPI: 48 year old gentleman, with past medical history of uncontrolled type 2 diabetes, on insulin, hypertension, status right BKA, depression, and hyperlipidemia, presents via EMS after being found unconscious at home with hypoglycemia. EMS unsuccessfully attempted to resuscitate him with infusion  of dextrose before they brought him to the emergency department. Per EMS- patient was unconscious on scene with glucose 37. Gave patient 1 amp, BS went up to 71. Pt ate 3 peanut butter sandwiches and 3 cokes, sugar dropped back to 49. Pt was diaphoretic. EMS began D10. CBG upon arrival was 196. He was also noticed to be hypothermic with a temperature of 93.3. Patient reports that he was in his usual state of health in the recent days. On the night of admission, his wife noticed that he was becoming confused, diaphoretic, and pale and the CBG was 14 at one point. Before EMS arrived, she had tried to give him crackers and juice, but his blood sugars remained below in the ranges of 30 to 40s. Patient reports that he had good dinner and had taken his usual insulin of Lantus 36 units before dinner but did not take his sliding scale insulin. Before Lantus administration his CBG was 200s. He denies recent fevers, chills, ranges, increased fatigue, or any other constitutional symptoms. However, the patient reports that over the past several months his left  foot amputation stump opened, and over the past several weeks he noticed increased drainage, and pain. He also notice foul smell to it. He has been attending the wound center with a foot infection is poorly progressed. He last saw Dr. Sharol Given in December 2014 who discharged him to the wound center for further care. At the time of our evaluation, patient appear to be fully conscious and alert, and was able to provide some history supplemented by his wife.  Hospital Course by problem list:   1. Hypoglycemia w/ Uncontrolled DM Type II- Patient presented to ED w/ symptomatic hypoglycemia, blood sugars as low as 14. He is on insulin at home and he does not take any oral hypoglycemics. Reports regular meals and no recent changes in his medications. Most likely trigger of his hypoglycemic episode is likely 2/2 cellulitis of the left foot. CBG's improved w/ D10, changed to D5NS  @ 50 cc/hr and discontinued once CBG's increased to normal range. Then added ISS-S + Lantus 20 units and AC/HS CBG's. HbA1c found to be 11.5. Insulin regimen found to be ineffective, increased Lantus to 30 units and changed to ISS-M, CBG's dropped into the low 200's high 100's. When discussing insulin regimen w/ patient, he claims he does not always check his blood sugar, sometimes doesn't eat adequate meals, sometimes eats too much and also claims that he gets symptomatic hypoglycemia if his blood sugar is <200. Some question about changing patient to 70/30, however, more concern that sugars would be high for some time if not titrated properly. Something to consider on an outpatient basis.   2. LLE Cellulitis- Patient admitted w/ new serosanguinous drainage and poor healing lesion on previous amputation site on left foot performed by Dr. Sharol Given in 08/2012. Patient noted foul odor, pain, and redness. Noted to have some tenderness to palpation, drainage, and questionable fluctuance over the site. CX of the foot showed no signs of osteomyelitis. Started on vancomycin + Zosyn. Orthopedic surgery (Dr. Sharol Given) consulted, ordered MRI to rule out osteo. MRI performed, showed no cortical destruction or adjacent focal soft tissue abnormality to strongly suggest recurrent osteomyelitis. Discharged w/ wound care follow up and Keflex for full course of antibiotics. Foot to be debrided by Dr. Sharol Given on 07/28/13 in outpatient clinic.   Discharge Vitals:   BP 161/99  Pulse 77  Temp(Src) 98 F (36.7 C) (Oral)  Resp 18  Ht 5\' 8"  (1.727 m)  Wt 213 lb 8 oz (96.843 kg)  BMI 32.47 kg/m2  SpO2 100%  Discharge Labs:  Results for orders placed during the hospital encounter of 07/22/13 (from the past 24 hour(s))  GLUCOSE, CAPILLARY     Status: Abnormal   Collection Time    07/23/13  5:30 PM      Result Value Ref Range   Glucose-Capillary 201 (*) 70 - 99 mg/dL   Comment 1 Notify RN     Comment 2 Documented in Chart      GLUCOSE, CAPILLARY     Status: Abnormal   Collection Time    07/23/13  8:52 PM      Result Value Ref Range   Glucose-Capillary 279 (*) 70 - 99 mg/dL  GLUCOSE, CAPILLARY     Status: Abnormal   Collection Time    07/24/13  8:12 AM      Result Value Ref Range   Glucose-Capillary 188 (*) 70 - 99 mg/dL  GLUCOSE, CAPILLARY     Status: Abnormal   Collection Time    07/24/13 12:04  PM      Result Value Ref Range   Glucose-Capillary 184 (*) 70 - 99 mg/dL    Signed: Corky Sox, MD 07/24/2013, 1:29 PM   Time Spent on Discharge: 35 minutes Services Ordered on Discharge: none Equipment Ordered on Discharge: none

## 2013-07-24 NOTE — Clinical Social Work Note (Signed)
CSW received consult regarding patient needing assistance with transportation. CSW visited room and spoke with patient and his wife Yong Channel. Patient did have transportation with [facility] transport, however they have gone out of business. Patient given phone number for [facility] transportation. Patient also requested and was given phone number for [facility] on [address] Patient lives in [place] and requested assistance with transportation home. Cab voucher supplied to transport patient and his wife home.   Teola Felipe Givens, MSW, LCSW [phone]

## 2013-07-24 NOTE — Progress Notes (Signed)
  Date: 07/24/2013  Patient name: [name]  Medical record number: [id number]  Date of birth: 10/19/1965   This patient has been seen and the plan of care was discussed with the house staff. Please see their note for complete details. I concur with their findings with the following additions/corrections: Feels better today. Discussed his insulin therapy. He states he is willing to attempt tighter control. He states his hypoglycemia may have been due to decreased PO and exercise. I would keep him on lantus as changing him to 70/30 may provide less control.  MRI without evidence of osteomyelitis. He was diagnosed with cellulitis, so would finish a course of doxy as outpatient for ~10 days total given his improvement in leukocytosis. He wants to continue to go to wound care center. If ok with ortho, may be D/C today.  Dominic Pea, DO, [facility] 07/24/2013, 11:22 AM

## 2013-07-24 NOTE — Progress Notes (Signed)
Subjective: Patient seen at bedside this AM, no significant complaints. Says he is feeling well today, denies any significant foot pain, fever, chills, nausea, or vomiting.   MRI shows no cellulitis.   Objective: Vital signs in last 24 hours: Filed Vitals:   07/23/13 1838 07/23/13 2053 07/24/13 0457 07/24/13 0910  BP: 158/81 172/100 163/104 161/99  Pulse: 79 86 84 77  Temp: 98.1 F (36.7 C) 98.4 F (36.9 C) 98.6 F (37 C) 98 F (36.7 C)  TempSrc: Oral Oral Oral Oral  Resp: 20 18 18 18   Height:      Weight:      SpO2: 100% 100% 100% 100%   Weight change:   Intake/Output Summary (Last 24 hours) at 07/24/13 1158 Last data filed at 07/24/13 0929  Gross per 24 hour  Intake   1200 ml  Output   1650 ml  Net   -450 ml   Physical Exam: General: Alert, cooperative, NAD.  HEENT: PERRL, EOMI. Moist mucus membranes. Neck: Full range of motion without pain, supple, no lymphadenopathy or carotid bruits Lungs: Clear to ascultation bilaterally, normal work of respiration, no wheezes, rales, rhonchi Heart: RRR, no murmurs, gallops, or rubs Abdomen: Soft, non-tender, non-distended, BS + Extremities: Right BKA, clean stump, no erythema or drainage. Left partial foot amputation w/ small area of erythema and serosanguinous drainage closest to 1st toe.  Neurologic: Alert & oriented X3, cranial nerves II-XII intact, strength grossly intact, sensation intact to light touch  Lab Results: Basic Metabolic Panel:  Recent Labs Lab 07/22/13 0316 07/23/13 0534  NA 140 138  K 3.8 4.3  CL 101 101  CO2 23 21  GLUCOSE 78 331*  BUN 24* 19  CREATININE 1.34 1.50*  CALCIUM 9.3 8.9   Liver Function Tests:  Recent Labs Lab 07/22/13 0316  AST 23  ALT 13  ALKPHOS 115  BILITOT 0.3  PROT 7.6  ALBUMIN 3.5   CBC:  Recent Labs Lab 07/22/13 0316 07/23/13 0534  WBC 11.3* 7.4  NEUTROABS 9.0*  --   HGB 12.2* 11.7*  HCT 35.7* 34.8*  MCV 88.8 89.5  PLT 384 349   CBG:  Recent  Labs Lab 07/22/13 2159 07/23/13 0732 07/23/13 1224 07/23/13 1730 07/23/13 2052 07/24/13 0812  GLUCAP 307* 275* 188* 201* 279* 188*   Hemoglobin A1C:  Recent Labs Lab 07/22/13 0925  HGBA1C 11.5*   Urinalysis:  Recent Labs Lab 07/22/13 0450  COLORURINE YELLOW  LABSPEC 1.023  PHURINE 5.0  GLUCOSEU NEGATIVE  HGBUR TRACE*  BILIRUBINUR NEGATIVE  KETONESUR NEGATIVE  PROTEINUR 100*  UROBILINOGEN 1.0  NITRITE NEGATIVE  LEUKOCYTESUR NEGATIVE   Studies/Results: Mr Foot Left W Wo Contrast  07/24/2013   CLINICAL DATA:  Recurrent soft tissue ulceration status post left midfoot amputation. Question recurrent osteomyelitis.  EXAM: MRI OF THE LEFT FOREFOOT WITHOUT AND WITH CONTRAST  TECHNIQUE: Multiplanar, multisequence MR imaging was performed both before and after administration of intravenous contrast.  CONTRAST:  33mL MULTIHANCE GADOBENATE DIMEGLUMINE 529 MG/ML IV SOLN  COMPARISON:  Radiographs 07/22/2013.  MRI 02/18/2012.  FINDINGS: Since the prior MRI, the patient has undergone forefoot amputation through the bases of all the metatarsals. There is mild nonspecific marrow T2 hyperintensity and enhancement within the second metatarsal adjacent to the amputation site. Lesser T2 signal is present within the third metatarsal. There is no cortical destruction or adjacent soft tissue abnormality. The additional remaining metatarsal bases appear normal.  The bones of the hindfoot and midfoot appear normal. The alignment is normal  at the Lisfranc joint.  There is skin thickening over the amputation site. Mild underlying soft tissue edema and enhancement is demonstrated. No focal fluid collection or sinus tract is identified.  IMPRESSION: 1. Mild nonspecific marrow T2 hyperintensity and enhancement within the remaining bases of the second and third metatarsals status post interval forefoot transmetatarsal amputation. No cortical destruction or adjacent focal soft tissue abnormality is demonstrated to  strongly suggest recurrent osteomyelitis. 2. No evidence of soft tissue abscess or sinus tract.   Electronically Signed   By: Camie Patience M.D.   On: 07/24/2013 08:29   Medications: I have reviewed the patient's current medications. Scheduled Meds: . collagenase   Topical Daily  . FLUoxetine  10 mg Oral Daily  . gabapentin  600 mg Oral TID  . heparin subcutaneous  5,000 Units Subcutaneous 3 times per day  . insulin aspart  0-15 Units Subcutaneous TID WC  . insulin glargine  30 Units Subcutaneous QHS  . nicotine  14 mg Transdermal Daily  . pantoprazole  40 mg Oral Daily  . piperacillin-tazobactam (ZOSYN)  IV  3.375 g Intravenous Q8H  . vancomycin  1,000 mg Intravenous Q12H   Continuous Infusions:  PRN Meds:.acetaminophen  Assessment/Plan: [name] is a 48 y.o. male w/ PMHx of HTN, DM type II, CKD, PVD, GERD, g/o right BKA, and partial eft foot amputation, admitted for hypoglycemia and left foot cellulitis vs osteomyelitis.  Left Foot Cellulitis- S/p ray amputation of the left foot in May 2014 by Dr Sharol Given. XR and MRI yesterday not suggestive of osteomyelitis. Scheduled outpatient visit on 07/28/13 w/ Dr. Sharol Given for debridement.  -Discharge w/ Keflex 500 mg po q12h -Wound care as outpatient -F/u on [facility] clinic also organized on discharge.  DM type II w/ Hypoglycemia- Resolved. CBG's better controlled. Discussed insulin control at home, will make changes to how he takes the insulin and will be sure to check blood sugar regularly. Will continue Lantus + SS for now, may make adjustements or convert to 70/30 as an outpatient.  -Continnue Lantus to 30 units, resume home dose of 36 units on discharge. -ISS-M + CBG's AC/HS  DVT/PE PPx- Heparin Sabin  Dispo: Anticipated discharge today.   The patient does have a current PCP [name], MD) and does need an [facility] hospital follow-up appointment after discharge.  The patient does not have transportation limitations that hinder  transportation to clinic appointments.  .Services Needed at time of discharge: Y = Yes, Blank = No PT:   OT:   RN:   Equipment:   Other:     LOS: 2 days   Corky Sox, MD 07/24/2013, 11:58 AM

## 2013-07-25 ENCOUNTER — Other Ambulatory Visit: Payer: Self-pay | Admitting: *Deleted

## 2013-07-25 MED ORDER — GLUCOSE BLOOD VI STRP: ORAL_STRIP | Status: DC

## 2013-07-25 NOTE — Care Management Note (Signed)
Late Entry:  CARE MANAGEMENT NOTE 07/25/2013  Patient:  [name], [name]   Account Number:  [id number]  Date Initiated:  07/24/2013  Documentation initiated by:  Jasmine Pang  Subjective/Objective Assessment:   Referral by MD to arrange appointment at [facility].     Action/Plan:   07/24/13 Call placed to wound center at 1214, await return call to schedule appointment.   Anticipated DC Date:  07/24/2013   Anticipated DC Plan:  HOME/SELF CARE         Choice offered to / List presented to:             Status of service:  Completed, signed off Medicare Important Message given?   (If response is "NO", the following Medicare IM given date fields will be blank) Date Medicare IM given:   Date Additional Medicare IM given:    Discharge Disposition:  HOME/SELF CARE  Per UR Regulation:    If discussed at Long Length of Stay Meetings, dates discussed:    Comments:  07/24/13 Call from Dr Ronnald Ramp requesting CM to schedule an appointment at the [facility] for this pt to receive onging wound care. Call placed to that facility, await return call. Order for [facility] to be used only is pt is unable to return to the wound care center, as explained to this case manager. Jasmine Pang RN MPH, case manager, [phone] Addem spoke with Dr Ronnald Ramp re d/c needs, noted pt has been active with the [facility] in the past  still no return call. Pt is scheduled to see Dr Sharol Given on Monday for followup care of wound. HHRN not needed at this time as pt will see Dr Sharol Given on Monday. Dr Sharol Given will arrange care as needed after he see's this pt for possible I&D in the office on 07/28/2013. Jasmine Pang RN MPH, case manager, [phone] addemdum: This CM spoke again to the [facility] and was able to schedule and appointment for August 05, 2013 @ 10:30am , pt nurse KG notifed and added to pt d/c instructions. CRoyal RN MPH, case manager, [phone]

## 2013-07-28 LAB — CULTURE, BLOOD (ROUTINE X 2)
Culture: NO GROWTH
Culture: NO GROWTH

## 2013-07-28 NOTE — Discharge Summary (Signed)
  Date: 07/28/2013  Patient name: [name]  Medical record number: [id number]  Date of birth: July 23, 1965   This patient has been seen and the plan of care was discussed with the house staff. Please see their note for complete details. I concur with their findings and plan.  Dominic Pea, DO, [facility] 07/28/2013, 12:32 PM

## 2013-07-31 ENCOUNTER — Ambulatory Visit: Payer: Medicaid Other | Admitting: Internal Medicine

## 2013-07-31 ENCOUNTER — Encounter: Payer: Self-pay | Admitting: Internal Medicine

## 2013-08-05 ENCOUNTER — Encounter (HOSPITAL_BASED_OUTPATIENT_CLINIC_OR_DEPARTMENT_OTHER): Payer: Medicaid Other | Attending: General Surgery

## 2013-09-09 ENCOUNTER — Encounter: Payer: Self-pay | Admitting: Internal Medicine

## 2013-09-10 ENCOUNTER — Encounter: Payer: Medicaid Other | Admitting: Internal Medicine

## 2013-10-16 ENCOUNTER — Ambulatory Visit (INDEPENDENT_AMBULATORY_CARE_PROVIDER_SITE_OTHER): Payer: Medicaid Other | Admitting: Internal Medicine

## 2013-10-16 ENCOUNTER — Encounter: Payer: Self-pay | Admitting: Internal Medicine

## 2013-10-16 VITALS — BP 152/92 | HR 96 | Temp 98.6°F | Ht 68.0 in | Wt 223.2 lb

## 2013-10-16 DIAGNOSIS — E1165 Type 2 diabetes mellitus with hyperglycemia: Secondary | ICD-10-CM

## 2013-10-16 DIAGNOSIS — L97509 Non-pressure chronic ulcer of other part of unspecified foot with unspecified severity: Secondary | ICD-10-CM

## 2013-10-16 DIAGNOSIS — E1159 Type 2 diabetes mellitus with other circulatory complications: Secondary | ICD-10-CM

## 2013-10-16 DIAGNOSIS — L97529 Non-pressure chronic ulcer of other part of left foot with unspecified severity: Secondary | ICD-10-CM | POA: Insufficient documentation

## 2013-10-16 DIAGNOSIS — I10 Essential (primary) hypertension: Secondary | ICD-10-CM

## 2013-10-16 DIAGNOSIS — IMO0001 Reserved for inherently not codable concepts without codable children: Secondary | ICD-10-CM

## 2013-10-16 DIAGNOSIS — E1149 Type 2 diabetes mellitus with other diabetic neurological complication: Secondary | ICD-10-CM

## 2013-10-16 DIAGNOSIS — S88119A Complete traumatic amputation at level between knee and ankle, unspecified lower leg, initial encounter: Secondary | ICD-10-CM

## 2013-10-16 LAB — BASIC METABOLIC PANEL WITH GFR
BUN: 22 mg/dL (ref 6–23)
CALCIUM: 9.2 mg/dL (ref 8.4–10.5)
CO2: 23 mEq/L (ref 19–32)
Chloride: 104 mEq/L (ref 96–112)
Creat: 1.45 mg/dL — ABNORMAL HIGH (ref 0.50–1.35)
GFR, EST AFRICAN AMERICAN: 65 mL/min
GFR, Est Non African American: 57 mL/min — ABNORMAL LOW
Glucose, Bld: 199 mg/dL — ABNORMAL HIGH (ref 70–99)
POTASSIUM: 4.4 meq/L (ref 3.5–5.3)
SODIUM: 140 meq/L (ref 135–145)

## 2013-10-16 LAB — POCT GLYCOSYLATED HEMOGLOBIN (HGB A1C): HEMOGLOBIN A1C: 9

## 2013-10-16 LAB — GLUCOSE, CAPILLARY: Glucose-Capillary: 187 mg/dL — ABNORMAL HIGH (ref 70–99)

## 2013-10-16 MED ORDER — GABAPENTIN 600 MG PO TABS: 600.0000 mg | ORAL_TABLET | Freq: Three times a day (TID) | ORAL | Status: DC

## 2013-10-16 NOTE — Assessment & Plan Note (Signed)
Patient is currently not taking anything for HTN. Lisinopril was discontinued recently.  Plans: Check BMP to evaluate for Cr. Consider starting at the next office visit. Follow up in one week.

## 2013-10-16 NOTE — Patient Instructions (Signed)
We will follow up with the MRI results and inform you the results. New prescription for Gabapentin 600 mg is sent to your pharmacy. Take all the medications as recommended below.

## 2013-10-16 NOTE — Progress Notes (Signed)
Subjective:   Patient ID: [name] male   DOB: Jun 22, 1965 48 y.o.   MRN: [id number]  HPI: [name] is a 48 y.o. gentleman with PMH significant for HTN, DM-II, Major depression, s/p Right BKA, S/P Ray amputation of the left foot comes to the office for chronic left foot ulcer follow up.  Patient was hospitalized from 07/22/13 to 07/24/13 for LLE cellulitis/ ulcer at the site of the left foot amputation site. Patient underwent MRI which revealed no osteomyelitis. Dr. Sharol Given was consulted during that hospital admission and recommended Wound debridement in his office as an outpatient. Patient was given an appointment to see Dr. Sharol Given on 07/28/13 but patient reports he didn't follow up with him because of transportation issues.  Patient reports that the ulcer is about the same size as in April, not oozing any pus, and denies swelling of the leg. He denies any pain at the stump site, fever, chills, body pains, nausea, vomiting. He denies any other systemic symptoms. He was also provided with hospital follow up appointment but he never showed up. This is his first office visit after he got discharged. He reports compliance to his antibiotics that were prescribed at the time of discharge.  He denies any other complaints.  Past Medical History  Diagnosis Date  . Onychomycosis   . Hypertension   . Diabetic peripheral neuropathy   . Diabetic foot ulcer     s/p Right first ray amputation, left transmetatarsal amputation with revision  . History of drug abuse     Cocaine and marijuana  . Exposure to trichomonas     treated empirically  . Cellulitis of left foot 03/2008    left 4th and 5th metatarsal area  . Hyperlipidemia   . Alcoholic pancreatitis 53/6468  . 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 Mallory [name] tear April 2009  . History of prolonged Q-T interval on ECG   . History of chronic pyelonephritis     secondary to left pyeloureteral junction  obstruction.  . 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 CKD (chronic kidney disease) stage 2, GFR 60-89 ml/min     BL SCr 1.3-1.4  . Depression   . Peripheral vascular disease   . Type 2 diabetes mellitus, uncontrolled, with renal complications   . GERD (gastroesophageal reflux disease)   . 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 Recurrent Foot Osteomyelitis 02/19/2012    Recurrent osteomyelitis of toes with multiple ray amputations Follows up with Dr Sharol Given    Current Outpatient Prescriptions  Medication Sig Dispense Refill  . acetaminophen (TYLENOL) 500 MG tablet Take 1,000 mg by mouth every 8 (eight) hours as needed.      Marland Kitchen FLUoxetine (PROZAC) 10 MG capsule Take 1 capsule (10 mg total) by mouth daily.  30 capsule  5  . gabapentin (NEURONTIN) 600 MG tablet Take 1 tablet (600 mg total) by mouth 3 (three) times daily.  90 tablet  2  . glucose blood (ACCU-CHEK SMARTVIEW) test strip Use as instructed  100 each  12  . insulin aspart (NOVOLOG) 100 UNIT/ML injection Inject 3-5 Units into the skin 3 (three) times daily with meals. CBG > 300 3 units, > 400 4 units, > 500 5 units  1 vial  12  . insulin glargine (LANTUS) 100  UNIT/ML injection Inject 0.36 mLs (36 Units total) into the skin at bedtime.  10 mL  11  . pantoprazole (PROTONIX) 40 MG tablet Take 1 tablet (40 mg total) by mouth daily.  90 tablet  11   No current facility-administered medications for this visit.   Family History  Problem Relation Age of Onset  . Diabetes type II Mother   . Pancreatic cancer Father    History   Social History  . Marital Status: Married    Spouse Name: N/A    Number of Children: N/A  . Years of Education: N/A   Social History Main Topics  . Smoking status: Current Every Day Smoker -- 0.50 packs/day for 34 years    Types: Cigarettes  . Smokeless tobacco: Never Used      Comment: Has stopped 2 weeks ago- QUIT line info given to pt and wife for asst with smoking patchers, etc  . Alcohol Use: Yes     Comment: occ  . Drug Use: Yes    Special: Marijuana, Cocaine     Comment: NO LONGER USES-GRADUATING FROM GEO CRE AFTER  3-4 MONTHS                                                                                                                                       . Sexual Activity: Yes    Birth Control/ Protection: None   Other Topics Concern  . None   Social History Narrative  . None   Review of Systems: Pertinent items are noted in HPI. Objective:  Physical Exam: Filed Vitals:   10/16/13 0951  BP: 152/92  Pulse: 96  Temp: 98.6 F (37 C)  TempSrc: Oral  Height: 5\' 8"  (1.727 m)  Weight: 223 lb 3.2 oz (101.243 kg)  SpO2: 95%   Constitutional: Vital signs reviewed.  Patient is a well-developed and well-nourished and is in no acute distress and cooperative with exam.  Neck: No carotid bruit present.  Cardiovascular: RRR, S1 normal, S2 normal, no MRG Pulmonary/Chest: normal respiratory effort, CTAB, no wheezes, rales, or rhonchi Musculoskeletal:  Right BKA. Left foot s/p ray amputation. Chronic appearing ulcer noted at the left foot stump site. No signs of induration or surrounding cellulitis. No purulent discharge noted. Ulcer base looks slightly red. Thick callus formation surrounding the ulcer. There is also another ulcer noted at the left lateral aspect of the left foot. There is no sign of induration or cellulitis. No increased warmth noted. Neurological: A&O x3  Skin: Warm, dry and intact.  Psychiatric: Normal mood and affect. Assessment & Plan:

## 2013-10-16 NOTE — Assessment & Plan Note (Addendum)
S/P left foot ray amputation.  S/p Hospitalization for cellulitis of LLE and infected ulcer of the left foot at the stump site. Patient didn't follow up with Dr. Sharol Given for debridement (as per patient), now presenting with persistent chronic ulcer with a new ulcer along the lateral edge of the left foot. No clinical signs suggestive of super added infection of the ulcer or cellulitis but given the duration of the ulcer, osteomyelitis is still a possibility. Discussed with the attending regarding further management and plan.  Plans: MRI W/O CM (Spoke to radiology who recommended without CM given his elevated Cr) Follow up on MRI for further plans. No clinical indications for oral antibiotic therapy currently. Will need Abx if MRI is positive for Osteomyelitis. Recommended to follow up with Dr.Duda ASAP. Wound care referral.  Addendum: 5PM 10/16/13 Called patient at home number to find out why the MRI was not done. I left a voice message to have this done immediately at the radiology or go to the emergency immediately to get the test done.

## 2013-10-17 ENCOUNTER — Telehealth: Payer: Self-pay | Admitting: *Deleted

## 2013-10-17 NOTE — Telephone Encounter (Signed)
Call to patient spoke with patient's wife.  Unable to get transportation for MRI will need to call for transportation 2 days prior to appointment.  Awaiting Prior Authorization from [facility] before MRI can be scheduled.  Sander Nephew, RN 10/17/2013 3:50 PM.

## 2013-10-20 NOTE — Progress Notes (Signed)
Case discussed with Dr. Boggala at the time of the visit.  We reviewed the resident's history and exam and pertinent patient test results.  I agree with the assessment, diagnosis, and plan of care documented in the resident's note. 

## 2013-10-24 NOTE — Telephone Encounter (Signed)
Call to pt to notify him of MRI on 11/07/2013 at 3:00 PM.  Information on appointment was given to S. Scofield for the Clinics to arrange transportation for the patient.  Sander Nephew, RN 717/2015 1:38 PM.

## 2013-10-31 ENCOUNTER — Other Ambulatory Visit: Payer: Self-pay | Admitting: Internal Medicine

## 2013-10-31 ENCOUNTER — Telehealth: Payer: Self-pay | Admitting: *Deleted

## 2013-10-31 DIAGNOSIS — L97529 Non-pressure chronic ulcer of other part of left foot with unspecified severity: Secondary | ICD-10-CM

## 2013-10-31 MED ORDER — MELOXICAM 7.5 MG PO TABS: 7.5000 mg | ORAL_TABLET | Freq: Every day | ORAL | Status: DC

## 2013-10-31 NOTE — Assessment & Plan Note (Signed)
Insurance company declined to pay for the MRI.  Plans: Xray left foot to rule out Osteomyelitis. Called patient and recommended to have it done ASAP. Start Mobic for pain.

## 2013-10-31 NOTE — Telephone Encounter (Signed)
Pt was seen in clinic on 7/9 and thought you were going to order a fluid pill and pain med. He is calling to check on both.  Did you want to order any meds?  Pt # [id number]

## 2013-11-03 ENCOUNTER — Encounter: Payer: Self-pay | Admitting: Internal Medicine

## 2013-11-03 ENCOUNTER — Ambulatory Visit: Payer: Medicaid Other | Admitting: Internal Medicine

## 2013-11-03 ENCOUNTER — Ambulatory Visit (INDEPENDENT_AMBULATORY_CARE_PROVIDER_SITE_OTHER): Payer: Medicaid Other | Admitting: Internal Medicine

## 2013-11-03 ENCOUNTER — Ambulatory Visit (HOSPITAL_COMMUNITY)
Admission: RE | Admit: 2013-11-03 | Discharge: 2013-11-03 | Disposition: A | Discharge status: Home / Self Care | Payer: Medicaid Other | Source: Ambulatory Visit | Attending: Internal Medicine | Admitting: Internal Medicine

## 2013-11-03 VITALS — BP 135/89 | HR 93 | Temp 98.0°F | Ht 68.0 in | Wt 228.3 lb

## 2013-11-03 DIAGNOSIS — L97529 Non-pressure chronic ulcer of other part of left foot with unspecified severity: Secondary | ICD-10-CM

## 2013-11-03 DIAGNOSIS — E1149 Type 2 diabetes mellitus with other diabetic neurological complication: Secondary | ICD-10-CM

## 2013-11-03 DIAGNOSIS — Z89511 Acquired absence of right leg below knee: Secondary | ICD-10-CM

## 2013-11-03 DIAGNOSIS — L97509 Non-pressure chronic ulcer of other part of unspecified foot with unspecified severity: Secondary | ICD-10-CM | POA: Diagnosis not present

## 2013-11-03 DIAGNOSIS — I10 Essential (primary) hypertension: Secondary | ICD-10-CM

## 2013-11-03 DIAGNOSIS — S88119A Complete traumatic amputation at level between knee and ankle, unspecified lower leg, initial encounter: Secondary | ICD-10-CM

## 2013-11-03 DIAGNOSIS — E1159 Type 2 diabetes mellitus with other circulatory complications: Secondary | ICD-10-CM

## 2013-11-03 LAB — GLUCOSE, CAPILLARY: Glucose-Capillary: 118 mg/dL — ABNORMAL HIGH (ref 70–99)

## 2013-11-03 MED ORDER — INSULIN ASPART 100 UNIT/ML ~~LOC~~ SOLN: SUBCUTANEOUS | Status: DC

## 2013-11-03 NOTE — Patient Instructions (Addendum)
Follow up with Dr. Sharol Given as scheduled on 11/13/13 at 3:45 pm. Start taking Novolog Insulin, 5 units before breakfast, lunch and dinner. If your pre-meal blood sugar is less than 70, do not take insulin.

## 2013-11-03 NOTE — Progress Notes (Signed)
Subjective:   Patient ID: [name] male   DOB: [birth year] 48 y.o.   MRN: [id number]  HPI: [name] is a 48 y.o. gentleman with PMH significant for HTN, DM-II, Major depression, s/p Right BKA, S/P Ray amputation of the left foot comes to the office for a follow up his chronic left foot ulcer and his diabetes.  Patient was hospitalized from 07/22/13 to 07/24/13 for LLE cellulitis/ ulcer at the site of the left foot amputation site. Patient underwent MRI which revealed no osteomyelitis. Dr. Sharol Given was consulted during that hospital admission and recommended Wound debridement in his office as an outpatient. Patient was given an appointment to see Dr. Sharol Given on 07/28/13 but patient reports he didn't follow up with him because of transportation issues. I saw him on 10/16/13 when he was found to have chronic foot ulcer at the site of the left foot stump but there was no sign of super added infection or cellulitis. MRI of the foot W/O CM was ordered to rule out infection but his insurance company declined to do it. Patient had an X-ray done today which revealed no signs of osteomyelitis. Patient denies any oozing of pus, and denies swelling of the leg. He denies any pain at the stump site, fever, chills, body pains, nausea, vomiting. He denies any other systemic symptoms.   He brings his glucometer log to the office which revealed majority of the values elevated in 200's with a few values in 300's and 400's. There is one fasting value of 62. He reports compliance to his lantus insulin and reports that he takes Novolog insulin only when his cbg's are greater than 300.   He denies any other complaints.   Past Medical History  Diagnosis Date  . Onychomycosis   . Hypertension   . Diabetic peripheral neuropathy   . Diabetic foot ulcer     s/p Right first ray amputation, left transmetatarsal amputation with revision  . History of drug abuse     Cocaine and marijuana  . Exposure to trichomonas    treated empirically  . Cellulitis of left foot 03/2008    left 4th and 5th metatarsal area  . Hyperlipidemia   . Alcoholic pancreatitis 83/3825  . Ulcerative esophagitis 0539    severe, complicated with UGI bleed  . Mallory [name] tear April 2009  . History of prolonged Q-T interval on ECG   . 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  . 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 CKD (chronic kidney disease) stage 2, GFR 60-89 ml/min     BL SCr 1.3-1.4  . Depression   . Peripheral vascular disease   . Type 2 diabetes mellitus, uncontrolled, with renal complications   . GERD (gastroesophageal reflux disease)   . 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 Recurrent Foot Osteomyelitis 02/19/2012    Recurrent osteomyelitis of toes with multiple ray amputations Follows up with Dr Sharol Given    Current Outpatient Prescriptions  Medication Sig Dispense Refill  . acetaminophen (TYLENOL) 500 MG tablet Take 1,000 mg by mouth every 8 (eight) hours as needed.      Marland Kitchen FLUoxetine (PROZAC) 10 MG capsule Take 1 capsule (10 mg total) by mouth daily.  30 capsule  5  . gabapentin (NEURONTIN) 600 MG tablet Take  1 tablet (600 mg total) by mouth 3 (three) times daily.  90 tablet  2  . glucose blood (ACCU-CHEK SMARTVIEW) test strip Use as instructed  100 each  12  . insulin aspart (NOVOLOG) 100 UNIT/ML injection Inject 3-5 Units into the skin 3 (three) times daily with meals. CBG > 300 3 units, > 400 4 units, > 500 5 units  1 vial  12  . insulin glargine (LANTUS) 100 UNIT/ML injection Inject 0.36 mLs (36 Units total) into the skin at bedtime.  10 mL  11  . meloxicam (MOBIC) 7.5 MG tablet Take 1 tablet (7.5 mg total) by mouth daily.  30 tablet  2  . pantoprazole (PROTONIX) 40 MG tablet Take 1 tablet (40 mg total) by mouth daily.  90 tablet  11    No current facility-administered medications for this visit.   Family History  Problem Relation Age of Onset  . Diabetes type II Mother   . Pancreatic cancer Father    History   Social History  . Marital Status: Married    Spouse Name: N/A    Number of Children: N/A  . Years of Education: N/A   Social History Main Topics  . Smoking status: Current Every Day Smoker -- 0.50 packs/day for 34 years    Types: Cigarettes  . Smokeless tobacco: Never Used  . Alcohol Use: Yes     Comment: occ  . Drug Use: No     Comment: NO LONGER USES-GRADUATING FROM GEO CRE AFTER  3-4 MONTHS                                                                                                                                       . Sexual Activity: None   Other Topics Concern  . None   Social History Narrative  . None   Review of Systems: Pertinent items are noted in HPI. Objective:  Physical Exam: Filed Vitals:   11/03/13 1341  BP: 135/89  Pulse: 93  Temp: 98 F (36.7 C)  Height: 5\' 8"  (1.727 m)  Weight: 228 lb 4.8 oz (103.556 kg)  SpO2: 96%   Constitutional: Vital signs reviewed. Patient is a well-developed and well-nourished and is in no acute distress and cooperative with exam.  Cardiovascular: RRR, S1 normal, S2 normal, no MRG  Pulmonary/Chest: normal respiratory effort, CTAB, no wheezes, rales, or rhonchi  Musculoskeletal:  Right BKA.  Left foot s/p ray amputation. Chronic appearing ulcer noted at the left foot stump site. No signs of induration or surrounding cellulitis. No purulent discharge noted. Ulcer base looks slightly red. Thick callus formation surrounding the ulcer.  Neurological: A&O x3  Skin: Warm, dry and intact.  Psychiatric: Normal mood and affect.  Assessment & Plan:

## 2013-11-03 NOTE — Assessment & Plan Note (Addendum)
S/P left foot ray amputation.  S/p Hospitalization for cellulitis of LLE and infected ulcer of the left foot at the stump site from 07/22/13 to 07/24/13 X-ray from today revealing no radiological signs of osteomyelitis. No clinical signs suggestive of super added infection of the ulcer or cellulitis but given the duration of the ulcer, osteomyelitis is still a possibility. Discussed with the attending regarding further management and plan.  Plans: Scheduled an appointment with Dr. Sharol Given on 11/13/13 for debridement and further management of the chronic ulcer. Will send a note to CSW to arrange for transportation. Wound care referral.

## 2013-11-03 NOTE — Assessment & Plan Note (Signed)
Uncontrolled. Glucometer log reviewed and reveals one fasting blood sugar of 62 and the other fasting sugars are 323, 297, 187. Blood sugars during the rest of the day are elevated and are in 200's range with one value of 379 and another of 426. Patient currently taking Novolog insulin for cbg's greater than 300 only. Discussed with the attending regarding further management and plans.  Plans: Continue current strength of Lantus. Novolog 5 Units before each meal, scheduled. Avoid sliding scale. Recommended not to take any Novolog for pre-meal blood sugars less than. Follow up in 2 weeks.

## 2013-11-03 NOTE — Assessment & Plan Note (Signed)
Currently within desired range.   Plans: Continue to monitor his blood pressure. Consider treatment if the values are persistently >140/90.

## 2013-11-05 NOTE — Telephone Encounter (Signed)
meds ordered and pt seen again

## 2013-11-05 NOTE — Progress Notes (Signed)
Case discussed with Dr. Boggala at the time of the visit.  We reviewed the resident's history and exam and pertinent patient test results.  I agree with the assessment, diagnosis, and plan of care documented in the resident's note. 

## 2013-11-06 ENCOUNTER — Telehealth: Payer: Self-pay | Admitting: Internal Medicine

## 2013-11-06 NOTE — Telephone Encounter (Signed)
[facility] [phone].  Patient has requested RX be sent to this location as patient information was verified by Rep at [facility], also provided office visit note.  Information faxed to [phone]

## 2013-11-07 ENCOUNTER — Ambulatory Visit (HOSPITAL_COMMUNITY): Payer: Medicaid Other

## 2013-11-08 IMAGING — CR DG FOOT COMPLETE 3+V*R*
3 series · 3 of 3 positions shown · non-contrast
Comparison: 08/30/2012.

CLINICAL DATA: Worsening diabetic foot wound. Surgery on
08/31/2012.

RIGHT FOOT COMPLETE - 3+ VIEW

[view not recorded (1 of 3)]
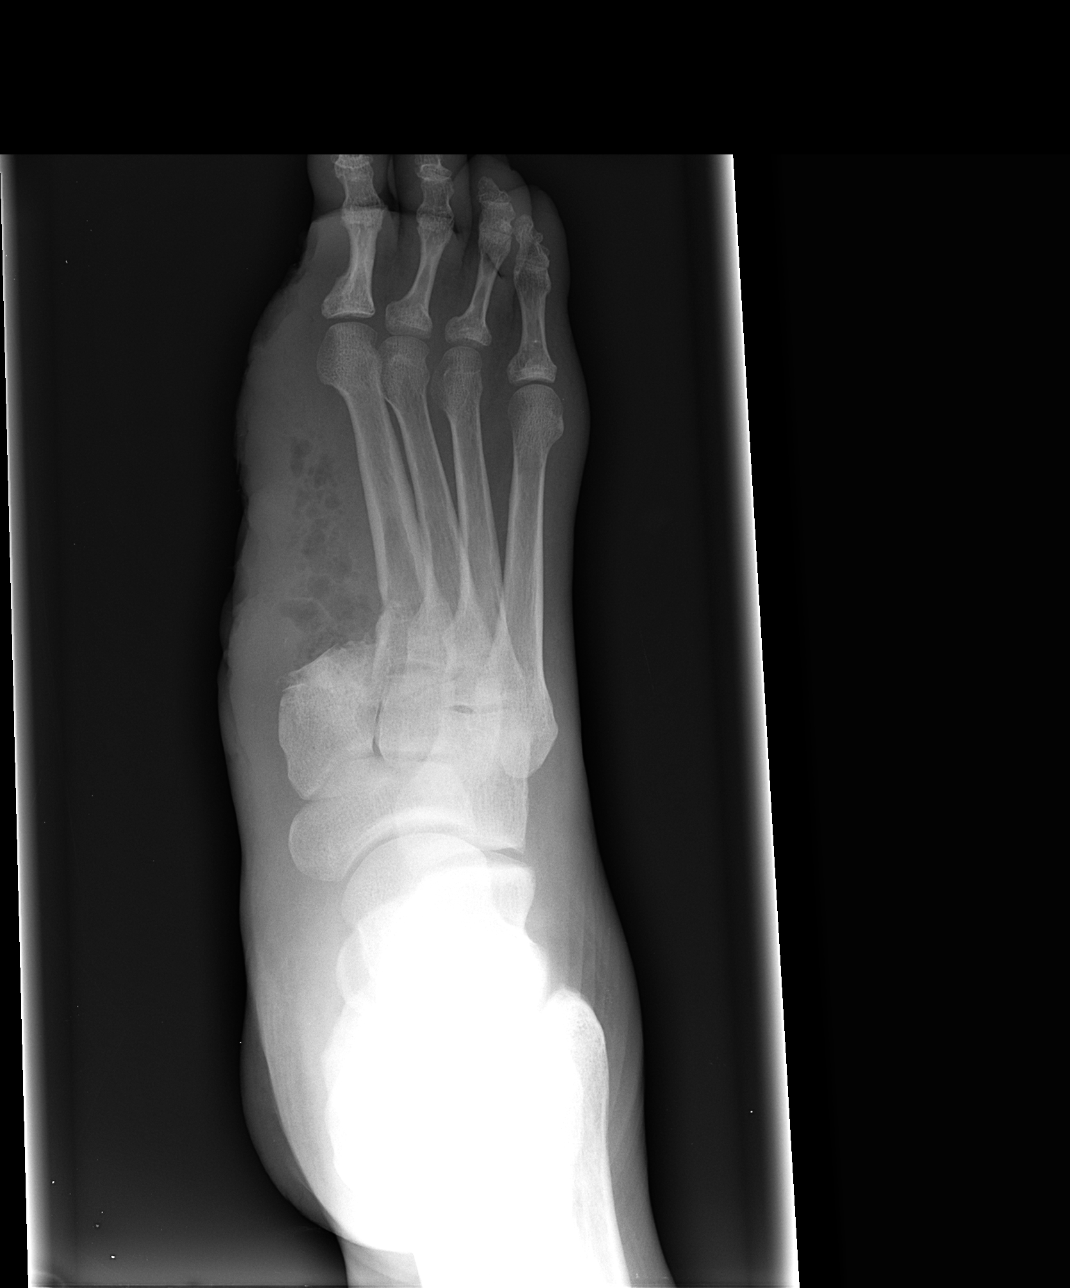

[view not recorded (2 of 3)]
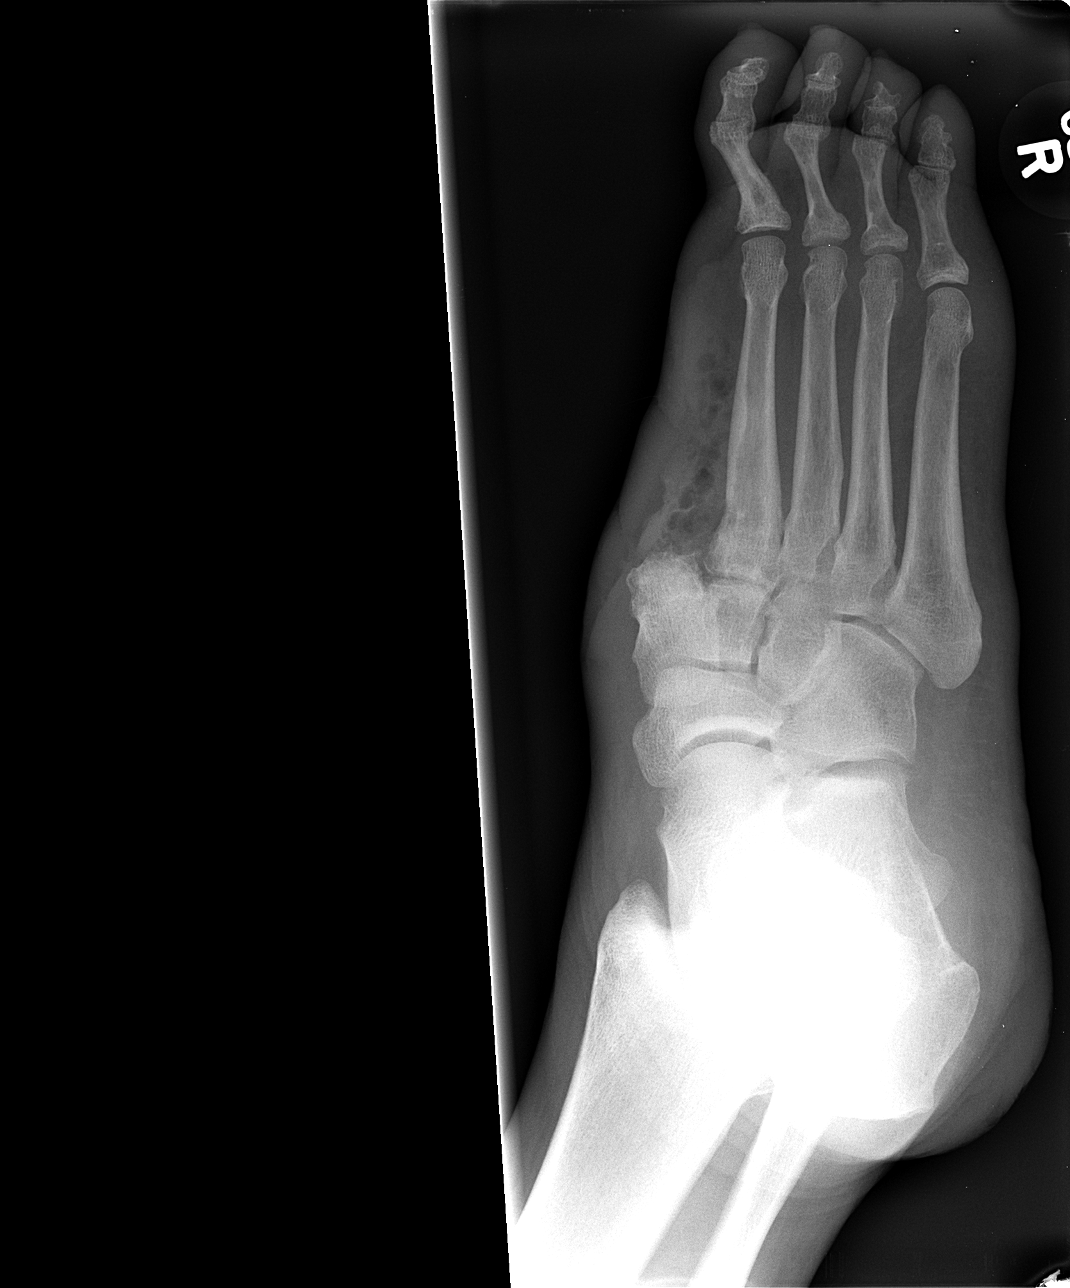

[view not recorded (3 of 3)]
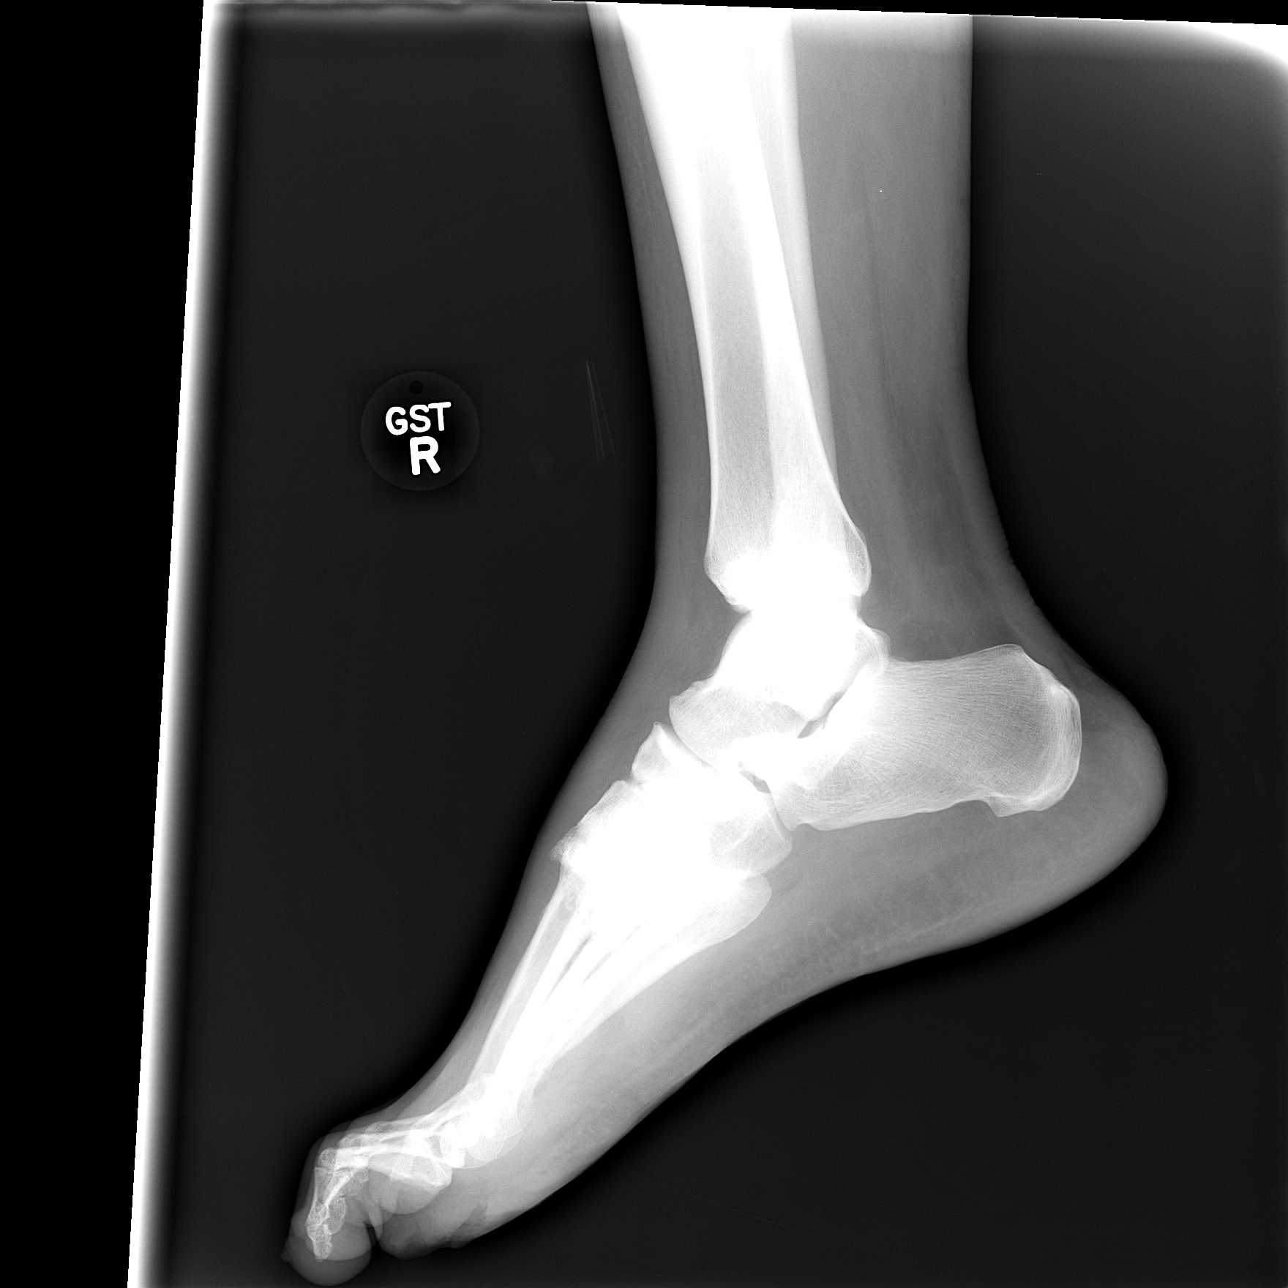

[3 of 3 positions shown; findings below may reference images not displayed]

FINDINGS: When compared to the prior examination there has been
interval resection of the remaining first proximal phalanx and the
entire first metatarsal.  Overlying soft tissues are irregular, and
there is extensive gas within the soft tissues at the site of
resection.  No definite destructive bony lesions are identified at
this time.  No acute displaced fracture, subluxation or dislocation
is noted.
IMPRESSION: 1.  Postoperative changes related to recent first ray amputation.
Extensive gas in the soft tissues in the resection bed may simply
be postoperative, however, clinical correlation for signs and
symptoms of soft tissue infection is strongly recommended.

## 2013-11-11 ENCOUNTER — Telehealth: Payer: Self-pay | Admitting: Licensed Clinical Social Worker

## 2013-11-11 NOTE — Telephone Encounter (Signed)
CSW received voice mail from [name]'s spouse, [name] requesting assistance with setting up transportation for [name]'s appointment with [facility] on Aug. 11th at noon.  CSW returned call to [name] verified address and appointment date/time.  Request sent to [facility].  CSW informed [name] of new option 4 to leave voice mail for [facility] for future appointments.

## 2013-11-13 NOTE — Telephone Encounter (Signed)
Spouse called back and notified.

## 2013-11-13 NOTE — Telephone Encounter (Signed)
Transportation response: Remind him to send in confirmation. Tell him to be ready 10:40 to 11:20  CSW placed call to Mr. Furlan, relayed information above.

## 2013-11-25 ENCOUNTER — Telehealth: Payer: Self-pay | Admitting: Licensed Clinical Social Worker

## 2013-11-25 NOTE — Telephone Encounter (Signed)
Spouse notified of pick up time: 8:50 - 9:10 am for pt's appointment on 11/27/13

## 2013-11-25 NOTE — Telephone Encounter (Signed)
CSW received call from Mrs. Fiorello stating she left message from [facility] but no confirmation back regarding scheduled transportation.  Spouse requesting assistance from [place] regarding scheduling transportation.  CSW informed Mrs. Honor to provide CSW with a listing of all scheduled appointments to turn in to [facility] for scheduling as CSW does not have access to all appointments via EP[facility].  Spouse states she will bring in on the 20th if she can or request [facility] to fax over.  [facility] had no record of message requesting transportation from the [place] family.  Request sent today, under the 3 day window unsure if transportation can be provided.  Phone numbers are limited by minutes.  Spouse states [phone] will run out today and [phone] will turn on tomorrow.

## 2013-12-09 ENCOUNTER — Other Ambulatory Visit: Payer: Self-pay | Admitting: *Deleted

## 2013-12-09 DIAGNOSIS — E1149 Type 2 diabetes mellitus with other diabetic neurological complication: Secondary | ICD-10-CM

## 2013-12-10 MED ORDER — GABAPENTIN 600 MG PO TABS: 600.0000 mg | ORAL_TABLET | Freq: Three times a day (TID) | ORAL | Status: DC

## 2013-12-11 ENCOUNTER — Other Ambulatory Visit (HOSPITAL_COMMUNITY): Payer: Self-pay | Admitting: Orthopedic Surgery

## 2013-12-11 NOTE — Progress Notes (Signed)
Unable to reach pt by phone. Left message on wife's cell phone with pre-op instructions. Instructed pt to be here at 12:10 PM tomorrow (9/4) and NPO after MN tonight. He may take Prozac, Gabapentin, Protonix in the AM with little sip of water and Tylenol if needed.

## 2013-12-12 ENCOUNTER — Inpatient Hospital Stay (HOSPITAL_COMMUNITY)
Admission: RE | Admit: 2013-12-12 | Discharge: 2013-12-14 | DRG: 475 | Disposition: A | Discharge status: Home Health | Payer: Medicaid Other | Source: Ambulatory Visit | Attending: Orthopedic Surgery | Admitting: Orthopedic Surgery

## 2013-12-12 ENCOUNTER — Ambulatory Visit (HOSPITAL_COMMUNITY): Payer: Medicaid Other

## 2013-12-12 ENCOUNTER — Encounter (HOSPITAL_COMMUNITY)
Admission: RE | Disposition: A | Discharge status: Home Health | Payer: Self-pay | Source: Ambulatory Visit | Attending: Orthopedic Surgery

## 2013-12-12 ENCOUNTER — Encounter (HOSPITAL_COMMUNITY): Payer: Self-pay | Admitting: Certified Registered Nurse Anesthetist

## 2013-12-12 ENCOUNTER — Encounter (HOSPITAL_COMMUNITY): Payer: Medicaid Other | Admitting: Anesthesiology

## 2013-12-12 ENCOUNTER — Ambulatory Visit (HOSPITAL_COMMUNITY): Payer: Medicaid Other | Admitting: Anesthesiology

## 2013-12-12 DIAGNOSIS — I129 Hypertensive chronic kidney disease with stage 1 through stage 4 chronic kidney disease, or unspecified chronic kidney disease: Secondary | ICD-10-CM | POA: Diagnosis present

## 2013-12-12 DIAGNOSIS — E1169 Type 2 diabetes mellitus with other specified complication: Secondary | ICD-10-CM | POA: Diagnosis present

## 2013-12-12 DIAGNOSIS — F17200 Nicotine dependence, unspecified, uncomplicated: Secondary | ICD-10-CM | POA: Diagnosis present

## 2013-12-12 DIAGNOSIS — N182 Chronic kidney disease, stage 2 (mild): Secondary | ICD-10-CM | POA: Diagnosis present

## 2013-12-12 DIAGNOSIS — F1410 Cocaine abuse, uncomplicated: Secondary | ICD-10-CM | POA: Diagnosis present

## 2013-12-12 DIAGNOSIS — K219 Gastro-esophageal reflux disease without esophagitis: Secondary | ICD-10-CM | POA: Diagnosis present

## 2013-12-12 DIAGNOSIS — T8789 Other complications of amputation stump: Secondary | ICD-10-CM | POA: Diagnosis present

## 2013-12-12 DIAGNOSIS — M869 Osteomyelitis, unspecified: Secondary | ICD-10-CM | POA: Diagnosis present

## 2013-12-12 DIAGNOSIS — E785 Hyperlipidemia, unspecified: Secondary | ICD-10-CM | POA: Diagnosis present

## 2013-12-12 DIAGNOSIS — Z833 Family history of diabetes mellitus: Secondary | ICD-10-CM

## 2013-12-12 DIAGNOSIS — Z905 Acquired absence of kidney: Secondary | ICD-10-CM | POA: Diagnosis not present

## 2013-12-12 DIAGNOSIS — IMO0002 Reserved for concepts with insufficient information to code with codable children: Secondary | ICD-10-CM

## 2013-12-12 DIAGNOSIS — L98499 Non-pressure chronic ulcer of skin of other sites with unspecified severity: Secondary | ICD-10-CM | POA: Diagnosis present

## 2013-12-12 DIAGNOSIS — Z6834 Body mass index (BMI) 34.0-34.9, adult: Secondary | ICD-10-CM

## 2013-12-12 DIAGNOSIS — F1210 Cannabis abuse, uncomplicated: Secondary | ICD-10-CM | POA: Diagnosis present

## 2013-12-12 DIAGNOSIS — Y835 Amputation of limb(s) as the cause of abnormal reaction of the patient, or of later complication, without mention of misadventure at the time of the procedure: Secondary | ICD-10-CM | POA: Diagnosis present

## 2013-12-12 DIAGNOSIS — E1142 Type 2 diabetes mellitus with diabetic polyneuropathy: Secondary | ICD-10-CM | POA: Diagnosis present

## 2013-12-12 DIAGNOSIS — E1149 Type 2 diabetes mellitus with other diabetic neurological complication: Secondary | ICD-10-CM | POA: Diagnosis present

## 2013-12-12 DIAGNOSIS — E669 Obesity, unspecified: Secondary | ICD-10-CM | POA: Diagnosis present

## 2013-12-12 DIAGNOSIS — Z800 Family history of malignant neoplasm of digestive organs: Secondary | ICD-10-CM | POA: Diagnosis not present

## 2013-12-12 DIAGNOSIS — M9080 Osteopathy in diseases classified elsewhere, unspecified site: Secondary | ICD-10-CM | POA: Diagnosis present

## 2013-12-12 DIAGNOSIS — S88119A Complete traumatic amputation at level between knee and ankle, unspecified lower leg, initial encounter: Secondary | ICD-10-CM

## 2013-12-12 DIAGNOSIS — L03119 Cellulitis of unspecified part of limb: Secondary | ICD-10-CM

## 2013-12-12 DIAGNOSIS — L02619 Cutaneous abscess of unspecified foot: Secondary | ICD-10-CM | POA: Diagnosis present

## 2013-12-12 DIAGNOSIS — Z89511 Acquired absence of right leg below knee: Secondary | ICD-10-CM

## 2013-12-12 HISTORY — PX: AMPUTATION: SHX166

## 2013-12-12 HISTORY — PX: BELOW KNEE LEG AMPUTATION: SUR23

## 2013-12-12 HISTORY — DX: Reserved for concepts with insufficient information to code with codable children: IMO0002

## 2013-12-12 LAB — COMPREHENSIVE METABOLIC PANEL
ALT: 7 U/L (ref 0–53)
AST: 12 U/L (ref 0–37)
Albumin: 3 g/dL — ABNORMAL LOW (ref 3.5–5.2)
Alkaline Phosphatase: 99 U/L (ref 39–117)
Anion gap: 12 (ref 5–15)
BUN: 16 mg/dL (ref 6–23)
CO2: 24 meq/L (ref 19–32)
Calcium: 8.3 mg/dL — ABNORMAL LOW (ref 8.4–10.5)
Chloride: 102 mEq/L (ref 96–112)
Creatinine, Ser: 1.52 mg/dL — ABNORMAL HIGH (ref 0.50–1.35)
GFR calc Af Amer: 61 mL/min — ABNORMAL LOW (ref >90)
GFR, EST NON AFRICAN AMERICAN: 53 mL/min — AB (ref >90)
Glucose, Bld: 217 mg/dL — ABNORMAL HIGH (ref 70–99)
POTASSIUM: 4.2 meq/L (ref 3.7–5.3)
Sodium: 138 mEq/L (ref 137–147)
Total Bilirubin: 0.4 mg/dL (ref 0.3–1.2)
Total Protein: 6.9 g/dL (ref 6.0–8.3)

## 2013-12-12 LAB — CBC
HCT: 32.4 % — ABNORMAL LOW (ref 39.0–52.0)
Hemoglobin: 10.6 g/dL — ABNORMAL LOW (ref 13.0–17.0)
MCH: 29.5 pg (ref 26.0–34.0)
MCHC: 32.7 g/dL (ref 30.0–36.0)
MCV: 90.3 fL (ref 78.0–100.0)
PLATELETS: 344 10*3/uL (ref 150–400)
RBC: 3.59 MIL/uL — ABNORMAL LOW (ref 4.22–5.81)
RDW: 12.6 % (ref 11.5–15.5)
WBC: 7.1 10*3/uL (ref 4.0–10.5)

## 2013-12-12 LAB — PROTIME-INR
INR: 1.14 (ref 0.00–1.49)
Prothrombin Time: 14.6 seconds (ref 11.6–15.2)

## 2013-12-12 LAB — GLUCOSE, CAPILLARY
GLUCOSE-CAPILLARY: 191 mg/dL — AB (ref 70–99)
Glucose-Capillary: 199 mg/dL — ABNORMAL HIGH (ref 70–99)
Glucose-Capillary: 162 mg/dL — ABNORMAL HIGH (ref 70–99)
Glucose-Capillary: 173 mg/dL — ABNORMAL HIGH (ref 70–99)
Glucose-Capillary: 333 mg/dL — ABNORMAL HIGH (ref 70–99)

## 2013-12-12 LAB — APTT: aPTT: 32 seconds (ref 24–37)

## 2013-12-12 SURGERY — AMPUTATION BELOW KNEE
Anesthesia: General | Site: Leg Lower | Laterality: Left

## 2013-12-12 MED ORDER — HYDROMORPHONE HCL PF 1 MG/ML IJ SOLN
0.5000 mg | INTRAMUSCULAR | Status: DC | PRN
  Administered 2013-12-12 – 2013-12-13 (×2): 1 mg via INTRAVENOUS
  Filled 2013-12-12 (×2): qty 1

## 2013-12-12 MED ORDER — PANTOPRAZOLE SODIUM 40 MG PO TBEC
40.0000 mg | DELAYED_RELEASE_TABLET | Freq: Every day | ORAL | Status: DC | PRN
  Administered 2013-12-13: 40 mg via ORAL
  Filled 2013-12-12: qty 1

## 2013-12-12 MED ORDER — ONDANSETRON HCL 4 MG/2ML IJ SOLN
INTRAMUSCULAR | Status: AC
  Filled 2013-12-12: qty 2

## 2013-12-12 MED ORDER — BUPIVACAINE LIPOSOME 1.3 % IJ SUSP
20.0000 mL | INTRAMUSCULAR | Status: AC
  Administered 2013-12-12: 20 mL
  Filled 2013-12-12: qty 20

## 2013-12-12 MED ORDER — LACTATED RINGERS IV SOLN
INTRAVENOUS | Status: DC
  Administered 2013-12-12: 12:00:00 via INTRAVENOUS

## 2013-12-12 MED ORDER — METHOCARBAMOL 500 MG PO TABS
500.0000 mg | ORAL_TABLET | Freq: Four times a day (QID) | ORAL | Status: DC | PRN
  Administered 2013-12-12: 500 mg via ORAL
  Filled 2013-12-12: qty 1

## 2013-12-12 MED ORDER — INSULIN ASPART 100 UNIT/ML ~~LOC~~ SOLN
0.0000 [IU] | Freq: Three times a day (TID) | SUBCUTANEOUS | Status: DC
  Administered 2013-12-12: 4 [IU] via SUBCUTANEOUS
  Administered 2013-12-13: 3 [IU] via SUBCUTANEOUS
  Administered 2013-12-13: 8 [IU] via SUBCUTANEOUS
  Administered 2013-12-13: 2 [IU] via SUBCUTANEOUS
  Administered 2013-12-14: 11 [IU] via SUBCUTANEOUS

## 2013-12-12 MED ORDER — INSULIN ASPART 100 UNIT/ML ~~LOC~~ SOLN
4.0000 [IU] | Freq: Three times a day (TID) | SUBCUTANEOUS | Status: DC
  Administered 2013-12-12 – 2013-12-14 (×5): 4 [IU] via SUBCUTANEOUS

## 2013-12-12 MED ORDER — OXYCODONE HCL 5 MG PO TABS
5.0000 mg | ORAL_TABLET | Freq: Once | ORAL | Status: AC | PRN
  Administered 2013-12-12: 5 mg via ORAL

## 2013-12-12 MED ORDER — METOCLOPRAMIDE HCL 10 MG PO TABS: 5.0000 mg | ORAL_TABLET | Freq: Three times a day (TID) | ORAL | Status: DC | PRN

## 2013-12-12 MED ORDER — FENTANYL CITRATE 0.05 MG/ML IJ SOLN
INTRAMUSCULAR | Status: AC
  Filled 2013-12-12: qty 5

## 2013-12-12 MED ORDER — CEFAZOLIN SODIUM-DEXTROSE 2-3 GM-% IV SOLR
2.0000 g | INTRAVENOUS | Status: AC
  Administered 2013-12-12: 2 g via INTRAVENOUS
  Filled 2013-12-12: qty 50

## 2013-12-12 MED ORDER — HYDROMORPHONE HCL PF 1 MG/ML IJ SOLN
INTRAMUSCULAR | Status: AC
  Administered 2013-12-12: 0.5 mg via INTRAVENOUS
  Filled 2013-12-12: qty 1

## 2013-12-12 MED ORDER — CEFAZOLIN SODIUM 1-5 GM-% IV SOLN
1.0000 g | Freq: Four times a day (QID) | INTRAVENOUS | Status: AC
  Administered 2013-12-12 – 2013-12-13 (×2): 1 g via INTRAVENOUS
  Filled 2013-12-12 (×3): qty 50

## 2013-12-12 MED ORDER — PROMETHAZINE HCL 25 MG/ML IJ SOLN: 6.2500 mg | INTRAMUSCULAR | Status: DC | PRN

## 2013-12-12 MED ORDER — SENNOSIDES-DOCUSATE SODIUM 8.6-50 MG PO TABS: 1.0000 | ORAL_TABLET | Freq: Every evening | ORAL | Status: DC | PRN

## 2013-12-12 MED ORDER — ONDANSETRON HCL 4 MG/2ML IJ SOLN: 4.0000 mg | Freq: Four times a day (QID) | INTRAMUSCULAR | Status: DC | PRN

## 2013-12-12 MED ORDER — PROPOFOL 10 MG/ML IV BOLUS
INTRAVENOUS | Status: AC
  Filled 2013-12-12: qty 20

## 2013-12-12 MED ORDER — ACETAMINOPHEN 10 MG/ML IV SOLN
1000.0000 mg | INTRAVENOUS | Status: AC
  Administered 2013-12-12: 1000 mg via INTRAVENOUS
  Filled 2013-12-12: qty 100

## 2013-12-12 MED ORDER — FENTANYL CITRATE 0.05 MG/ML IJ SOLN
INTRAMUSCULAR | Status: DC | PRN
Start: 2013-12-12 — End: 2013-12-12
  Administered 2013-12-12: 25 ug via INTRAVENOUS
  Administered 2013-12-12: 150 ug via INTRAVENOUS

## 2013-12-12 MED ORDER — OXYCODONE HCL 5 MG PO TABS
ORAL_TABLET | ORAL | Status: AC
  Filled 2013-12-12: qty 1

## 2013-12-12 MED ORDER — INSULIN GLARGINE 100 UNIT/ML ~~LOC~~ SOLN
36.0000 [IU] | Freq: Every day | SUBCUTANEOUS | Status: DC
  Administered 2013-12-12 – 2013-12-13 (×2): 36 [IU] via SUBCUTANEOUS
  Filled 2013-12-12 (×3): qty 0.36

## 2013-12-12 MED ORDER — LIDOCAINE HCL (CARDIAC) 20 MG/ML IV SOLN
INTRAVENOUS | Status: DC | PRN
  Administered 2013-12-12: 20 mg via INTRAVENOUS

## 2013-12-12 MED ORDER — SODIUM CHLORIDE 0.9 % IV SOLN
INTRAVENOUS | Status: DC
  Administered 2013-12-12 – 2013-12-13 (×2): via INTRAVENOUS

## 2013-12-12 MED ORDER — DOCUSATE SODIUM 100 MG PO CAPS
100.0000 mg | ORAL_CAPSULE | Freq: Two times a day (BID) | ORAL | Status: DC
  Administered 2013-12-12 – 2013-12-14 (×4): 100 mg via ORAL
  Filled 2013-12-12 (×5): qty 1

## 2013-12-12 MED ORDER — HYDROMORPHONE HCL PF 1 MG/ML IJ SOLN
INTRAMUSCULAR | Status: AC
  Filled 2013-12-12: qty 1

## 2013-12-12 MED ORDER — LACTATED RINGERS IV SOLN
INTRAVENOUS | Status: DC | PRN
  Administered 2013-12-12 (×2): via INTRAVENOUS

## 2013-12-12 MED ORDER — BISACODYL 5 MG PO TBEC
5.0000 mg | DELAYED_RELEASE_TABLET | Freq: Every day | ORAL | Status: DC | PRN
  Administered 2013-12-14: 5 mg via ORAL
  Filled 2013-12-12: qty 1

## 2013-12-12 MED ORDER — MEPERIDINE HCL 25 MG/ML IJ SOLN: 6.2500 mg | INTRAMUSCULAR | Status: DC | PRN

## 2013-12-12 MED ORDER — OXYCODONE-ACETAMINOPHEN 5-325 MG PO TABS
ORAL_TABLET | ORAL | Status: AC
  Administered 2013-12-12: 2 via ORAL
  Filled 2013-12-12: qty 2

## 2013-12-12 MED ORDER — PROPOFOL 10 MG/ML IV BOLUS
INTRAVENOUS | Status: DC | PRN
  Administered 2013-12-12: 50 mg via INTRAVENOUS
  Administered 2013-12-12: 150 mg via INTRAVENOUS

## 2013-12-12 MED ORDER — HYDROMORPHONE HCL PF 1 MG/ML IJ SOLN
0.2500 mg | INTRAMUSCULAR | Status: DC | PRN
  Administered 2013-12-12 (×4): 0.5 mg via INTRAVENOUS

## 2013-12-12 MED ORDER — PHENYLEPHRINE HCL 10 MG/ML IJ SOLN
INTRAMUSCULAR | Status: DC | PRN
  Administered 2013-12-12: 80 ug via INTRAVENOUS

## 2013-12-12 MED ORDER — MIDAZOLAM HCL 2 MG/2ML IJ SOLN
INTRAMUSCULAR | Status: AC
  Filled 2013-12-12: qty 2

## 2013-12-12 MED ORDER — GABAPENTIN 600 MG PO TABS
600.0000 mg | ORAL_TABLET | Freq: Three times a day (TID) | ORAL | Status: DC
  Administered 2013-12-12 – 2013-12-14 (×6): 600 mg via ORAL
  Filled 2013-12-12 (×9): qty 1

## 2013-12-12 MED ORDER — METOCLOPRAMIDE HCL 5 MG/ML IJ SOLN: 5.0000 mg | Freq: Three times a day (TID) | INTRAMUSCULAR | Status: DC | PRN

## 2013-12-12 MED ORDER — METHOCARBAMOL 1000 MG/10ML IJ SOLN
500.0000 mg | Freq: Four times a day (QID) | INTRAVENOUS | Status: DC | PRN
  Filled 2013-12-12: qty 5

## 2013-12-12 MED ORDER — ONDANSETRON HCL 4 MG/2ML IJ SOLN
INTRAMUSCULAR | Status: DC | PRN
Start: 2013-12-12 — End: 2013-12-12
  Administered 2013-12-12: 4 mg via INTRAVENOUS

## 2013-12-12 MED ORDER — MAGNESIUM CITRATE PO SOLN: 1.0000 | Freq: Once | ORAL | Status: AC | PRN

## 2013-12-12 MED ORDER — DEXAMETHASONE SODIUM PHOSPHATE 4 MG/ML IJ SOLN
INTRAMUSCULAR | Status: DC | PRN
  Administered 2013-12-12: 4 mg via INTRAVENOUS

## 2013-12-12 MED ORDER — OXYCODONE HCL 5 MG/5ML PO SOLN: 5.0000 mg | Freq: Once | ORAL | Status: AC | PRN

## 2013-12-12 MED ORDER — ONDANSETRON HCL 4 MG PO TABS
4.0000 mg | ORAL_TABLET | Freq: Four times a day (QID) | ORAL | Status: DC | PRN
Start: 2013-12-12 — End: 2013-12-14

## 2013-12-12 MED ORDER — LIDOCAINE HCL (CARDIAC) 20 MG/ML IV SOLN
INTRAVENOUS | Status: AC
  Filled 2013-12-12: qty 5

## 2013-12-12 MED ORDER — MIDAZOLAM HCL 2 MG/2ML IJ SOLN: 0.5000 mg | Freq: Once | INTRAMUSCULAR | Status: DC | PRN

## 2013-12-12 MED ORDER — ASPIRIN EC 325 MG PO TBEC
325.0000 mg | DELAYED_RELEASE_TABLET | Freq: Every day | ORAL | Status: DC
  Administered 2013-12-12 – 2013-12-14 (×3): 325 mg via ORAL
  Filled 2013-12-12 (×3): qty 1

## 2013-12-12 MED ORDER — OXYCODONE-ACETAMINOPHEN 5-325 MG PO TABS: 1.0000 | ORAL_TABLET | ORAL | Status: DC | PRN

## 2013-12-12 MED ORDER — MIDAZOLAM HCL 5 MG/5ML IJ SOLN
INTRAMUSCULAR | Status: DC | PRN
  Administered 2013-12-12: 2 mg via INTRAVENOUS

## 2013-12-12 MED ORDER — METHOCARBAMOL 500 MG PO TABS
ORAL_TABLET | ORAL | Status: AC
  Filled 2013-12-12: qty 1

## 2013-12-12 MED ORDER — FLUOXETINE HCL 10 MG PO CAPS
10.0000 mg | ORAL_CAPSULE | Freq: Every day | ORAL | Status: DC
  Administered 2013-12-12 – 2013-12-14 (×3): 10 mg via ORAL
  Filled 2013-12-12 (×3): qty 1

## 2013-12-12 MED ORDER — 0.9 % SODIUM CHLORIDE (POUR BTL) OPTIME
TOPICAL | Status: DC | PRN
  Administered 2013-12-12: 1000 mL

## 2013-12-12 MED ORDER — KETOROLAC TROMETHAMINE 30 MG/ML IJ SOLN
INTRAMUSCULAR | Status: DC | PRN
  Administered 2013-12-12: 30 mg via INTRAVENOUS

## 2013-12-12 MED ORDER — PHENYLEPHRINE 40 MCG/ML (10ML) SYRINGE FOR IV PUSH (FOR BLOOD PRESSURE SUPPORT)
PREFILLED_SYRINGE | INTRAVENOUS | Status: AC
Start: 2013-12-12 — End: 2013-12-12
  Filled 2013-12-12: qty 10

## 2013-12-12 MED ORDER — OXYCODONE-ACETAMINOPHEN 5-325 MG PO TABS
1.0000 | ORAL_TABLET | ORAL | Status: DC | PRN
  Administered 2013-12-12 – 2013-12-14 (×9): 2 via ORAL
  Filled 2013-12-12 (×8): qty 2

## 2013-12-12 SURGICAL SUPPLY — 43 items
BANDAGE ESMARK 6X9 LF (GAUZE/BANDAGES/DRESSINGS) ×1 IMPLANT
BLADE SAW RECIP 87.9 MT (BLADE) ×3 IMPLANT
BLADE SURG 21 STRL SS (BLADE) ×3 IMPLANT
BNDG CMPR 9X6 STRL LF SNTH (GAUZE/BANDAGES/DRESSINGS) ×1
BNDG COHESIVE 6X5 TAN STRL LF (GAUZE/BANDAGES/DRESSINGS) ×4 IMPLANT
BNDG ESMARK 6X9 LF (GAUZE/BANDAGES/DRESSINGS) ×3
BNDG GAUZE ELAST 4 BULKY (GAUZE/BANDAGES/DRESSINGS) ×6 IMPLANT
COVER SURGICAL LIGHT HANDLE (MISCELLANEOUS) ×3 IMPLANT
CUFF TOURNIQUET SINGLE 34IN LL (TOURNIQUET CUFF) IMPLANT
CUFF TOURNIQUET SINGLE 44IN (TOURNIQUET CUFF) IMPLANT
DRAIN PENROSE 1/2X12 LTX STRL (WOUND CARE) IMPLANT
DRAPE EXTREMITY T 121X128X90 (DRAPE) ×3 IMPLANT
DRAPE PROXIMA HALF (DRAPES) ×6 IMPLANT
DRAPE U-SHAPE 47X51 STRL (DRAPES) ×6 IMPLANT
DRSG ADAPTIC 3X8 NADH LF (GAUZE/BANDAGES/DRESSINGS) ×3 IMPLANT
DRSG PAD ABDOMINAL 8X10 ST (GAUZE/BANDAGES/DRESSINGS) ×3 IMPLANT
DURAPREP 26ML APPLICATOR (WOUND CARE) ×3 IMPLANT
ELECT REM PT RETURN 9FT ADLT (ELECTROSURGICAL) ×3
ELECTRODE REM PT RTRN 9FT ADLT (ELECTROSURGICAL) ×1 IMPLANT
GAUZE SPONGE 4X4 12PLY STRL (GAUZE/BANDAGES/DRESSINGS) ×3 IMPLANT
GLOVE BIOGEL PI IND STRL 9 (GLOVE) ×1 IMPLANT
GLOVE BIOGEL PI INDICATOR 9 (GLOVE) ×2
GLOVE SURG ORTHO 9.0 STRL STRW (GLOVE) ×3 IMPLANT
GOWN STRL REUS W/ TWL XL LVL3 (GOWN DISPOSABLE) ×2 IMPLANT
GOWN STRL REUS W/TWL XL LVL3 (GOWN DISPOSABLE) ×6
KIT BASIN OR (CUSTOM PROCEDURE TRAY) ×3 IMPLANT
KIT ROOM TURNOVER OR (KITS) ×3 IMPLANT
MANIFOLD NEPTUNE II (INSTRUMENTS) ×3 IMPLANT
NS IRRIG 1000ML POUR BTL (IV SOLUTION) ×3 IMPLANT
PACK GENERAL/GYN (CUSTOM PROCEDURE TRAY) ×3 IMPLANT
PAD ARMBOARD 7.5X6 YLW CONV (MISCELLANEOUS) ×6 IMPLANT
SPONGE GAUZE 4X4 12PLY STER LF (GAUZE/BANDAGES/DRESSINGS) ×2 IMPLANT
SPONGE LAP 18X18 X RAY DECT (DISPOSABLE) IMPLANT
STAPLER VISISTAT 35W (STAPLE) IMPLANT
STOCKINETTE IMPERVIOUS LG (DRAPES) ×3 IMPLANT
SUT PDS AB 1 CT  36 (SUTURE)
SUT PDS AB 1 CT 36 (SUTURE) IMPLANT
SUT SILK 2 0 (SUTURE) ×3
SUT SILK 2-0 18XBRD TIE 12 (SUTURE) ×1 IMPLANT
TOWEL OR 17X24 6PK STRL BLUE (TOWEL DISPOSABLE) ×3 IMPLANT
TOWEL OR 17X26 10 PK STRL BLUE (TOWEL DISPOSABLE) ×3 IMPLANT
TUBE ANAEROBIC SPECIMEN COL (MISCELLANEOUS) IMPLANT
WATER STERILE IRR 1000ML POUR (IV SOLUTION) ×3 IMPLANT

## 2013-12-12 NOTE — Anesthesia Preprocedure Evaluation (Addendum)
Anesthesia Evaluation  Patient identified by MRN, date of birth, ID band Patient awake    Reviewed: Allergy & Precautions, H&P , NPO status , Patient's Chart, lab work & pertinent test results  History of Anesthesia Complications Negative for: history of anesthetic complications  Airway Mallampati: II TM Distance: >3 FB Neck ROM: Full    Dental  (+) Dental Advisory Given, Teeth Intact   Pulmonary Current Smoker,  breath sounds clear to auscultation        Cardiovascular hypertension (no meds presently), + Peripheral Vascular Disease (s/p BKA) Rhythm:Regular Rate:Normal     Neuro/Psych PSYCHIATRIC DISORDERS Depression negative neurological ROS     GI/Hepatic Neg liver ROS, GERD-  Medicated and Controlled,  Endo/Other  diabetes (glu 199), Type 2, Insulin DependentMorbid obesity  Renal/GU Renal InsufficiencyRenal disease     Musculoskeletal   Abdominal (+) + obese,   Peds  Hematology   Anesthesia Other Findings   Reproductive/Obstetrics                       Anesthesia Physical Anesthesia Plan  ASA: III  Anesthesia Plan: General   Post-op Pain Management:    Induction: Intravenous  Airway Management Planned: LMA  Additional Equipment:   Intra-op Plan:   Post-operative Plan: Extubation in OR  Informed Consent: I have reviewed the patients History and Physical, chart, labs and discussed the procedure including the risks, benefits and alternatives for the proposed anesthesia with the patient or authorized representative who has indicated his/her understanding and acceptance.   Dental advisory given  Plan Discussed with: CRNA and Surgeon  Anesthesia Plan Comments: (Plan routine monitors, GA-LMA OK)      Anesthesia Quick Evaluation

## 2013-12-12 NOTE — H&P (Signed)
[name] is an 48 y.o. male.   Chief Complaint: Osteomyelitis and abscess left foot HPI: Patient is a 48 year old gentleman who is status post foot salvage intervention for the left foot. Patient has had progressive necrosis dehiscence and osteomyelitis and has failed limb salvage intervention.  Past Medical History  Diagnosis Date  . Onychomycosis   . Hypertension   . Diabetic peripheral neuropathy   . Diabetic foot ulcer     s/p Right first ray amputation, left transmetatarsal amputation with revision  . History of drug abuse     Cocaine and marijuana  . Exposure to trichomonas     treated empirically  . Cellulitis of left foot 03/2008    left 4th and 5th metatarsal area  . Hyperlipidemia   . 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 Mallory [name] tear April 2009  . History of prolonged Q-T interval on ECG   . 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  . 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 CKD (chronic kidney disease) stage 2, GFR 60-89 ml/min     BL SCr 1.3-1.4  . Depression   . Peripheral vascular disease   . Type 2 diabetes mellitus, uncontrolled, with renal complications   . GERD (gastroesophageal reflux disease)   . 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 Recurrent Foot Osteomyelitis 02/19/2012    Recurrent osteomyelitis of toes with multiple ray amputations Follows up with Dr Sharol Given     Past Surgical History  Procedure Laterality Date  . Toe amputation  06/2011    left; 4th toe  . Nephrectomy      L side  . I&d extremity  08/24/2011    Procedure: IRRIGATION AND DEBRIDEMENT EXTREMITY;  Surgeon: Meredith Pel, MD;  Location: [place];  Service: Orthopedics;  Laterality: Left;  . Amputation  08/24/2011    Procedure: AMPUTATION  RAY;  Surgeon: Meredith Pel, MD;  Location: [place];  Service: Orthopedics;  Laterality: Left;  left fourth toe Ray Resection  . Amputation  12/05/2011    Procedure: AMPUTATION RAY;  Surgeon: Meredith Pel, MD;  Location: [place];  Service: Orthopedics;  Laterality: Left;  . I&d extremity  02/18/2012    Procedure: IRRIGATION AND DEBRIDEMENT EXTREMITY;  Surgeon: Mcarthur Rossetti, MD;  Location: [place];  Service: Orthopedics;  Laterality: Left;  . Amputation  02/18/2012    Procedure: AMPUTATION DIGIT;  Surgeon: Mcarthur Rossetti, MD;  Location: [place];  Service: Orthopedics;  Laterality: Left;  revison transmetatarsal  . I&d extremity Left 08/28/2012    Procedure: IRRIGATION AND DEBRIDEMENT EXTREMITY;  Surgeon: Linna Hoff, MD;  Location: [place];  Service: Orthopedics;  Laterality: Left;  CYSTO TUBING/IRRIGATION, LEAD HAND.  . I&d extremity Left 08/30/2012    Procedure: IRRIGATION AND DEBRIDEMENT EXTREMITY;  Surgeon: Linna Hoff, MD;  Location: [place];  Service: Orthopedics;  Laterality: Left;  . Amputation Right 09/01/2012    Procedure: FOOT 1ST RAY AMPUTATION;  Surgeon: Newt Minion, MD;  Location: [place];  Service: Orthopedics;  Laterality: Right;  . Amputation Left 09/01/2012    Procedure: FOOT REVISION TRANSMETATARSAL AMPUTATION ;  Surgeon: Newt Minion, MD;  Location: [place];  Service: Orthopedics;  Laterality: Left;  .  Amputation Right 09/13/2012    Procedure: AMPUTATION BELOW KNEE;  Surgeon: Newt Minion, MD;  Location: [place];  Service: Orthopedics;  Laterality: Right;  Right Below Knee Amputation    Family History  Problem Relation Age of Onset  . Diabetes type II Mother   . Pancreatic cancer Father    Social History:  reports that he has been smoking Cigarettes.  He has a 17 pack-year smoking history. He has never used smokeless tobacco. He reports that he drinks alcohol. He reports that he does not use illicit drugs.  Allergies: No Known Allergies  No prescriptions  prior to admission    No results found for this or any previous visit (from the past 48 hour(s)). No results found.  Review of Systems  All other systems reviewed and are negative.   There were no vitals taken for this visit. Physical Exam  Progressive dehiscence necrosis and osteomyelitis of the left foot. Assessment/Plan Assessment: Dehiscence osteomyelitis and abscess left foot.  Plan: Will plan for a left transtibial amputation. Risks and benefits were discussed including nonhealing of the wound and need for higher level amputation. Patient states he understands and wished to proceed at this time.  [name] 12/12/2013, 6:42 AM

## 2013-12-12 NOTE — Op Note (Signed)
12/12/2013  6:33 PM  PATIENT:  [name]    PRE-OPERATIVE DIAGNOSIS:  Chronic Ulcer and Infection Left Midfoot Amputation  POST-OPERATIVE DIAGNOSIS:  Same  PROCEDURE:  Left Below Knee Amputation  SURGEON:  Newt Minion, MD  PHYSICIAN ASSISTANT:None ANESTHESIA:   General  PREOPERATIVE INDICATIONS:  [name] is a  48 y.o. male with a diagnosis of Chronic Ulcer and Infection Left Midfoot Amputation who failed conservative measures and elected for surgical management.    The risks benefits and alternatives were discussed with the patient preoperatively including but not limited to the risks of infection, bleeding, nerve injury, cardiopulmonary complications, the need for revision surgery, among others, and the patient was willing to proceed.  OPERATIVE IMPLANTS: none  OPERATIVE FINDINGS: Good muscle contractility  OPERATIVE PROCEDURE: Patient is a 48 year old gentleman with infection left foot. Patient has failed limb salvage intervention and presents at this time for left transtibial amputation. Risks and benefits were discussed including needing a higher level amputation. Patient states he understands and wished to proceed at this time. Description of procedure patient was brought to the operating room and underwent a general anesthetic. After adequate levels and anesthesia obtained patient's left lower extremity was prepped using DuraPrep draped into a sterile field and the foot was draped out of the sterile field with an impervious stockinette. A transverse incision was made 11 cm distal the tibial tubercle this curved proximally and a large posterior flap was created. The bone was transected just proximal to the skin incision the sciatic nerve was pulled cut and allowed to retract the vessels were suture ligated with 2-0 silk. The tourniquet was deflated hemostasis was obtained. The deep and superficial fascial layers were closed using #1 Vicryl the skin was closed using staples.  The wound is covered with compressive dressing. Patient was extubated taken to the PACU in stable condition.

## 2013-12-12 NOTE — Anesthesia Procedure Notes (Signed)
Procedure Name: LMA Insertion Date/Time: 12/12/2013 2:35 PM Performed by: Blair Heys E Pre-anesthesia Checklist: Patient identified, Emergency Drugs available, Suction available and Patient being monitored Patient Re-evaluated:Patient Re-evaluated prior to inductionOxygen Delivery Method: Circle system utilized Preoxygenation: Pre-oxygenation with 100% oxygen Intubation Type: IV induction LMA: LMA inserted LMA Size: 5.0 Number of attempts: 1 Placement Confirmation: positive ETCO2 and breath sounds checked- equal and bilateral Tube secured with: Tape Dental Injury: Teeth and Oropharynx as per pre-operative assessment

## 2013-12-12 NOTE — Transfer of Care (Signed)
Immediate Anesthesia Transfer of Care Note  Patient: [name]  Procedure(s) Performed: Procedure(s): Left Below Knee Amputation (Left)  Patient Location: PACU  Anesthesia Type:General  Level of Consciousness: awake and alert   Airway & Oxygen Therapy: Patient Spontanous Breathing and Patient connected to nasal cannula oxygen  Post-op Assessment: Report given to PACU RN, Post -op Vital signs reviewed and stable and Patient moving all extremities X 4  Post vital signs: Reviewed and stable  Complications: No apparent anesthesia complications

## 2013-12-13 LAB — GLUCOSE, CAPILLARY
GLUCOSE-CAPILLARY: 502 mg/dL — AB (ref 70–99)
GLUCOSE-CAPILLARY: 64 mg/dL — AB (ref 70–99)
GLUCOSE-CAPILLARY: 132 mg/dL — AB (ref 70–99)
Glucose-Capillary: 182 mg/dL — ABNORMAL HIGH (ref 70–99)
Glucose-Capillary: 254 mg/dL — ABNORMAL HIGH (ref 70–99)

## 2013-12-13 MED ORDER — INSULIN ASPART 100 UNIT/ML ~~LOC~~ SOLN
25.0000 [IU] | Freq: Once | SUBCUTANEOUS | Status: AC
  Administered 2013-12-13: 25 [IU] via SUBCUTANEOUS

## 2013-12-13 NOTE — Evaluation (Signed)
Physical Therapy Evaluation Patient Details Name: [name] MRN: [id number] DOB: Apr 29, 1965 Today's Date: 12/13/2013   History of Present Illness  48 y.o. male s/p left transtibial amputation. Hx of right transtibial amputation, HTN, and DM.  Clinical Impression  Patient is seen following the above procedure and presents with functional limitations due to the deficits listed below (see PT Problem List). Pt reports his right prosthesis is loose and is waiting to have it replaced, therefore would like to limit his ambulation due to Rt distal tibial pain from friction. Progressed quickly with transfer training (sit<>stand and pivot) from bed and wheelchair; currently at a min guard level. Patient will benefit from skilled PT to increase their independence and safety with mobility to allow discharge to the venue listed below.       Follow Up Recommendations Home health PT;Supervision for mobility/OOB    Equipment Recommendations  Other (comment) (Pt requests power scooter - states personal w/c broken)    Recommendations for Other Services OT consult     Precautions / Restrictions Precautions Precautions: Other (comment) (amputee) Restrictions Weight Bearing Restrictions: Yes LLE Weight Bearing: Non weight bearing      Mobility  Bed Mobility Overal bed mobility: Modified Independent                Transfers Overall transfer level: Needs assistance Equipment used: Rolling walker (2 wheeled) Transfers: Sit to/from [employer] Transfers Sit to Stand: Min assist;Min guard;+2 safety/equipment;From elevated surface Stand pivot transfers: Min guard;+2 safety/equipment       General transfer comment: Min assist +2 for walker stability initially with sit<>stand from elevated bed surface. Performed x 4 and progressed to min guard with VC for technique and hand placement. Also performed sit<>stand from w/c x 2 at min guard level for safety, demonstrating good stability upon  standing. Min guard with pivot transfer to and from bed and w/c with no loss of balance. Safely handles RW.  Ambulation/Gait                Stairs            Wheelchair Mobility    Modified Rankin (Stroke Patients Only)       Balance Overall balance assessment: Needs assistance Sitting-balance support: No upper extremity supported;Feet unsupported Sitting balance-Leahy Scale: Good     Standing balance support: Single extremity supported Standing balance-Leahy Scale: Poor                               Pertinent Vitals/Pain Pain Assessment: 0-10 Pain Score: 8  Pain Location: left residual limb Pain Intervention(s): Limited activity within patient's tolerance;Monitored during session;Premedicated before session;Repositioned    Home Living Family/patient expects to be discharged to:: Private residence Living Arrangements: Spouse/significant other Available Help at Discharge: Family;Available 24 hours/day Type of Home: House Home Access: Ramped entrance     Home Layout: One level Home Equipment: Wheelchair - [employer] - 2 wheels;Bedside commode (3-in-one commode)      Prior Function Level of Independence: Independent with assistive device(s)         Comments: prosthetic leg  - states did not use walker     Hand Dominance        Extremity/Trunk Assessment   Upper Extremity Assessment: Defer to OT evaluation           Lower Extremity Assessment: RLE deficits/detail;LLE deficits/detail RLE Deficits / Details: Hx of transtibial amputation - functional with prosthetic leg  LLE Deficits / Details: decreased strength, ROM, and phantom limb pain - as expected post op amputation     Communication   Communication: No difficulties  Cognition Arousal/Alertness: Awake/alert Behavior During Therapy: WFL for tasks assessed/performed Overall Cognitive Status: Within Functional Limits for tasks assessed                       General Comments General comments (skin integrity, edema, etc.): Pt reports soreness at Rt distal tibial calus from loose prosthetic    Exercises Amputee Exercises Quad Sets: AROM;Left;10 reps;Supine Knee Flexion: AROM;Left;10 reps;Seated Knee Extension: AROM;Left;10 reps;Seated Straight Leg Raises: Strengthening;Left;Supine;5 reps      Assessment/Plan    PT Assessment Patient needs continued PT services  PT Diagnosis Difficulty walking;Abnormality of gait;Acute pain   PT Problem List Decreased strength;Decreased range of motion;Decreased activity tolerance;Decreased balance;Decreased mobility;Decreased knowledge of use of DME;Decreased knowledge of precautions;Pain  PT Treatment Interventions DME instruction;Gait training;Functional mobility training;Therapeutic activities;Therapeutic exercise;Balance training;Neuromuscular re-education;Patient/family education;Wheelchair mobility training;Modalities   PT Goals (Current goals can be found in the Care Plan section) Acute Rehab PT Goals Patient Stated Goal: Go home PT Goal Formulation: With patient Time For Goal Achievement: 12/20/13 Potential to Achieve Goals: Good    Frequency Min 3X/week   Barriers to discharge Inaccessible home environment Pt with no electricity or running water    Co-evaluation               End of Session   Activity Tolerance: Patient tolerated treatment well Patient left: in bed;with call bell/phone within reach Nurse Communication: Mobility status         Time: 1339-1416 PT Time Calculation (min): 37 min   Charges:   PT Evaluation $Initial PT Evaluation Tier I: 1 Procedure PT Treatments $Therapeutic Activity: 8-22 mins $Self Care/Home Management: 8-22   PT G Codes:        Elayne Snare, [place]   Ellouise Newer 12/13/2013, 2:49 PM

## 2013-12-13 NOTE — Progress Notes (Signed)
Subjective: 1 Day Post-Op Procedure(s) (LRB): Left Below Knee Amputation (Left) Patient reports pain as mild.   CBG was 500 plus this AM then 182 now 64.   Orange juice given  Objective: Vital signs in last 24 hours: Temp:  [97.3 F (36.3 C)-98.9 F (37.2 C)] 97.8 F (36.6 C) (09/05 0508) Pulse Rate:  [69-93] 81 (09/05 0508) Resp:  [15-20] 20 (09/05 0508) BP: (113-154)/(70-94) 154/88 mmHg (09/05 0508) SpO2:  [96 %-100 %] 100 % (09/05 0508) Weight:  [103.6 kg (228 lb 6.3 oz)] 103.6 kg (228 lb 6.3 oz) (09/04 1210)  Intake/Output from previous day: 09/04 0701 - 09/05 0700 In: 1480 [P.O.:480; I.V.:1000] Out: 1800 [Urine:1750; Blood:50] Intake/Output this shift:     Recent Labs  12/12/13 1214  HGB 10.6*    Recent Labs  12/12/13 1214  WBC 7.1  RBC 3.59*  HCT 32.4*  PLT 344    Recent Labs  12/12/13 1214  NA 138  K 4.2  CL 102  CO2 24  BUN 16  CREATININE 1.52*  GLUCOSE 217*  CALCIUM 8.3*    Recent Labs  12/12/13 1214  INR 1.14    dressing dry  Assessment/Plan: 1 Day Post-Op Procedure(s) (LRB): Left Below Knee Amputation (Left) Up with therapy  Adarryl Goldammer C 12/13/2013, 10:01 AM

## 2013-12-13 NOTE — Progress Notes (Addendum)
Occupational Therapy Evaluation Patient Details Name: [name] MRN: [id number] DOB: [birth year] Today's Date: 12/13/2013    History of Present Illness 48 y.o. male s/p left transtibial amputation. Hx of right transtibial amputation, HTN, and DM.   Clinical Impression   PTA pt lived at home with his wife and was independent with use of prosthesis and reports that he was not using a walker. Pt was independent with ADLs and reports that he was able to chop wood while wearing prosthesis. Pt has good UE strength and demonstrated ability to perform sitting/lateral leans in bed and reports that his prosthetic has been rubbing his Rt leg and wants to minimize use at this time. Pt is very loquacious and OT provided education and training completed for safety with ADLs as much as time allowed. Pt would benefit from skilled OT to address toilet transfers and LB ADLs as well as amputee education.     Follow Up Recommendations  Supervision/Assistance - 24 hour    Equipment Recommendations  None recommended by OT    Recommendations for Other Services       Precautions / Restrictions Precautions Precautions: Other (comment) (amputee) Restrictions Weight Bearing Restrictions: Yes LLE Weight Bearing: Non weight bearing      Mobility Bed Mobility Overal bed mobility: Modified Independent                Transfers Overall transfer level: Needs assistance Equipment used: Rolling walker (2 wheeled) Transfers: Sit to/from [employer] Transfers Sit to Stand: Min assist;Min guard;+2 safety/equipment;From elevated surface Stand pivot transfers: Min guard;+2 safety/equipment       General transfer comment: Pt did not get OOB this date as prosthesis has been irritating his RLE.     Balance Overall balance assessment: Needs assistance Sitting-balance support: No upper extremity supported;Feet unsupported Sitting balance-Leahy Scale: Good                                   ADL Overall ADL's : Needs assistance/impaired Eating/Feeding: Independent;Sitting   Grooming: Set up;Sitting   Upper Body Bathing: Set up;Sitting   Lower Body Bathing: Set up;Sitting/lateral leans   Upper Body Dressing : Set up;Sitting   Lower Body Dressing: Set up;Sitting/lateral leans                 General ADL Comments: Pt declined OOB activities but demonstrated good bed mobility and strength in UEs to perform lateral leans for LB ADLs. Pt very loquacious, however educated as much as possible on safety with ADLs. Pt reports that he has no electricity or running water at home and that he "keeps water at home to bathe" and reports that he sponge bathes at home.      Vision  No apparent visual deficits.                    Perception Perception Perception Tested?: No   Praxis Praxis Praxis tested?: Within functional limits    Pertinent Vitals/Pain Pain Assessment: No/denies pain Pain Score: 8  Pain Location: left residual limb Pain Intervention(s): Limited activity within patient's tolerance;Monitored during session;Premedicated before session;Repositioned     Hand Dominance Right   Extremity/Trunk Assessment Upper Extremity Assessment Upper Extremity Assessment: Overall WFL for tasks assessed   Lower Extremity Assessment Lower Extremity Assessment: Defer to PT evaluation RLE Deficits / Details: Hx of transtibial amputation - functional with prosthetic leg LLE Deficits / Details: decreased strength,  ROM, and phantom limb pain - as expected post op amputation LLE: Unable to fully assess due to pain   Cervical / Trunk Assessment Cervical / Trunk Assessment: Normal   Communication Communication Communication: No difficulties   Cognition Arousal/Alertness: Awake/alert Behavior During Therapy: WFL for tasks assessed/performed Overall Cognitive Status: Within Functional Limits for tasks assessed                                Home  Living Family/patient expects to be discharged to:: Private residence Living Arrangements: Spouse/significant other Available Help at Discharge: Family;Available 24 hours/day Type of Home: House Home Access: Ramped entrance     Home Layout: One level               Home Equipment: Wheelchair - [employer] - 2 wheels;Bedside commode          Prior Functioning/Environment Level of Independence: Independent with assistive device(s)        Comments: prosthetic leg  - states did not use walker    OT Diagnosis: Generalized weakness;Acute pain   OT Problem List: Impaired balance (sitting and/or standing);Decreased knowledge of use of DME or AE;Pain   OT Treatment/Interventions: Self-care/ADL training;Energy conservation;DME and/or AE instruction;Therapeutic activities;Patient/family education;Balance training    OT Goals(Current goals can be found in the care plan section) Acute Rehab OT Goals Patient Stated Goal: Go home OT Goal Formulation: With patient Time For Goal Achievement: 12/20/13 Potential to Achieve Goals: Good ADL Goals Pt Will Perform Lower Body Dressing: with modified independence;sitting/lateral leans;sit to/from stand Pt Will Transfer to Toilet: with modified independence;stand pivot transfer;bedside commode Pt Will Perform Toileting - Clothing Manipulation and hygiene: with modified independence;sit to/from stand;sitting/lateral leans  OT Frequency: Min 2X/week              End of Session Nurse Communication: Other (comment) (pt requesting w/c to visit family member in hospital)  Activity Tolerance: Patient tolerated treatment well Patient left: in bed;with call bell/phone within reach;with nursing/sitter in room   Time: 5697-9480 OT Time Calculation (min): 61 min Charges:  OT General Charges $OT Visit: 1 Procedure OT Evaluation $Initial OT Evaluation Tier I: 1 Procedure OT Treatments $Self Care/Home Management : 23-37 mins Jacqulyn Cane [phone] 12/13/2013, 4:25 PM

## 2013-12-14 LAB — GLUCOSE, CAPILLARY
Glucose-Capillary: 150 mg/dL — ABNORMAL HIGH (ref 70–99)
Glucose-Capillary: 327 mg/dL — ABNORMAL HIGH (ref 70–99)
Glucose-Capillary: 118 mg/dL — ABNORMAL HIGH (ref 70–99)

## 2013-12-14 NOTE — Progress Notes (Signed)
PT Cancellation Note  Patient Details Name: DONAVIN AUDINO MRN: [id number] DOB: 04/21/1965   Cancelled Treatment:    Reason Eval/Treat Not Completed: Patient declined, no reason specified Pt politely declines physical therapy services. Reports he understands all exercises and precautions provided and reviewed during previous therapy session.  Will continue to follow until d/c.  [place], [place]   Candie Mile S 12/14/2013, 11:56 AM

## 2013-12-14 NOTE — Progress Notes (Signed)
UR Completed.  [name] [phone] 12/14/2013  

## 2013-12-14 NOTE — Progress Notes (Signed)
Subjective: 2 Days Post-Op Procedure(s) (LRB): Left Below Knee Amputation (Left) Patient reports pain as mild.    Objective: Vital signs in last 24 hours: Temp:  [98.1 F (36.7 C)-98.2 F (36.8 C)] 98.2 F (36.8 C) (09/06 0553) Pulse Rate:  [88-94] 88 (09/06 0553) Resp:  [16-20] 18 (09/06 0553) BP: (158-173)/(83-132) 158/83 mmHg (09/06 0553) SpO2:  [100 %] 100 % (09/06 0553)  Intake/Output from previous day: 09/05 0701 - 09/06 0700 In: 55 [P.O.:240; I.V.:200; IV Piggyback:50] Out: 2891 [Urine:2891] Intake/Output this shift:     Recent Labs  12/12/13 1214  HGB 10.6*    Recent Labs  12/12/13 1214  WBC 7.1  RBC 3.59*  HCT 32.4*  PLT 344    Recent Labs  12/12/13 1214  NA 138  K 4.2  CL 102  CO2 24  BUN 16  CREATININE 1.52*  GLUCOSE 217*  CALCIUM 8.3*    Recent Labs  12/12/13 1214  INR 1.14    dressing intact  Assessment/Plan: 2 Days Post-Op Procedure(s) (LRB): Left Below Knee Amputation (Left) Discharge home with home health  Elmer Merwin C 12/14/2013, 9:56 AM

## 2013-12-15 NOTE — Discharge Summary (Addendum)
  Final diagnosis osteomyelitis and ulceration left foot. Surgical procedure left transtibial amputation. Discharge to home in stable condition. Followup in the office in 2 weeks.  Postoperatively patient was seen by physical therapy. Patient was able to ambulate independently. Patient was discharged to home with home health physical therapy and durable medical equipment.

## 2013-12-16 ENCOUNTER — Encounter (HOSPITAL_COMMUNITY): Payer: Self-pay | Admitting: Orthopedic Surgery

## 2013-12-16 NOTE — Anesthesia Postprocedure Evaluation (Signed)
  Anesthesia Post-op Note  Patient: [name]  Procedure(s) Performed: Procedure(s): Left Below Knee Amputation (Left)  Patient was D/C to home with no apparent anesthetic complications

## 2013-12-17 ENCOUNTER — Telehealth: Payer: Self-pay | Admitting: Licensed Clinical Social Worker

## 2013-12-17 NOTE — Telephone Encounter (Signed)
[name] placed call to CSW this morning.  "They amputated my other leg now".  Pt voiced frustration with his current manual wheelchair.  [name] states his current manual wheelchair only has a leg rest for the Right side and was told his Left side should be elevated as well.  Pt in need of left leg rest for current wheelchair.  In addition, [name] states he was in the office in 2014 to start paperwork for power wheelchair but was told to wait until the first of 2015.  [name] states he is in need of a power wheelchair as he feels unsafe going down his ramp in the manual wheelchair.  "I feel like I'm going to fall out".  CSW informed [name], this worker will forward this information to PCP.  Pt aware he most likely will need to come in for an appointment to start referral for power wheelchair.  Pt requesting CSW to notify [facility] that he is wheelchair bound and will need wheelchair Lucianne Lei transport going forward.   [name] denies add'l needs at this time.  CSW routed information to PCP.

## 2013-12-24 ENCOUNTER — Other Ambulatory Visit: Payer: Self-pay | Admitting: Internal Medicine

## 2013-12-29 ENCOUNTER — Telehealth: Payer: Self-pay | Admitting: Licensed Clinical Social Worker

## 2013-12-29 NOTE — Telephone Encounter (Signed)
[name] placed call to CSW to inquire about transportation for newly scheduled [facility] appointment on 10/14.  CSW has date and time and pt aware CSW will send in request 2 weeks in advanced per protocol.  [name] notified Shellia Carwin is requesting his confirmation slips to be completed and turned on 01/09/14 appointment.   [name] inquiring if CSW is aware of any companies that provide power wheelchair to low income/under insured population.  CSW currently is not aware of any agencies thus far.  Pt inquiring if he is able to find a program would CSW assist.  CSW informed [name], CSW would be available to assist with necessary paperwork needed.  [name] current dilemma is that he utilized his Medicaid to receive current manual wheelchair and pt is afraid his medicaid will not cover a power wheelchair until 2017.

## 2013-12-31 ENCOUNTER — Telehealth: Payer: Self-pay | Admitting: Licensed Clinical Social Worker

## 2013-12-31 NOTE — Telephone Encounter (Signed)
[name] placed call to CSW requesting CSW to contact his previous Vocational Rehabilitation worker at [phone] to inquire if pt could resume with their agency to obtain a power wheelchair.  CSW doubtful as Production designer, theatre/television/film utilizes pt benefits for power wheelchair and [name] has already exhausted his w/c benefits until 2017.  However, as courtesy to [name], CSW placed call and left message with [name], Vocational Rehab to inquire.

## 2014-01-07 NOTE — Telephone Encounter (Signed)
Per pt's request, CSW discussed case with Independent Living DME coordinator, [name].  Most power wheelchairs are provided for those that have limited to no upper body strength. Ms. Luvenia Heller states with pt having mobility and upper body strength most likely power wheelchair would not be beneficial as it would limit/decrease muscle strength.  Pt's age and functional movement, difficult to justify need for Independent Living to assist with power wheelchair.  CSW will notify Mr. Homes.

## 2014-01-07 NOTE — Telephone Encounter (Signed)
Thanks Shana 

## 2014-01-08 ENCOUNTER — Other Ambulatory Visit: Payer: Self-pay | Admitting: Internal Medicine

## 2014-01-08 DIAGNOSIS — Z89432 Acquired absence of left foot: Secondary | ICD-10-CM

## 2014-01-08 NOTE — Telephone Encounter (Signed)
Pt notified of decision from [place].  Pt requesting Left Leg Rest and new manual wheelchair.  CSW placed call to [facility] notified of pt's issues with current manual chair, [facility] will contact pt today to try to repair.  Discussed with PCP of need for Left leg rest order.

## 2014-01-08 NOTE — Telephone Encounter (Signed)
I ordered the left leg rest. I will have it signed and give it to you.  Thanks

## 2014-01-14 ENCOUNTER — Telehealth: Payer: Self-pay | Admitting: Licensed Clinical Social Worker

## 2014-01-14 ENCOUNTER — Encounter: Payer: Self-pay | Admitting: *Deleted

## 2014-01-14 NOTE — Telephone Encounter (Signed)
Spouse left message requesting transportation for 01/23/14 appointment to be canceled. CSW sent message to [employer] canceling transportation for 01/23/14.

## 2014-01-21 ENCOUNTER — Ambulatory Visit (INDEPENDENT_AMBULATORY_CARE_PROVIDER_SITE_OTHER): Payer: Medicaid Other | Admitting: Internal Medicine

## 2014-01-21 ENCOUNTER — Inpatient Hospital Stay (HOSPITAL_COMMUNITY): Payer: Medicaid Other

## 2014-01-21 ENCOUNTER — Inpatient Hospital Stay (HOSPITAL_COMMUNITY)
Admission: AD | Admit: 2014-01-21 | Discharge: 2014-01-25 | DRG: 474 | Disposition: A | Discharge status: Home Health | Payer: Medicaid Other | Source: Ambulatory Visit | Attending: Internal Medicine | Admitting: Internal Medicine

## 2014-01-21 ENCOUNTER — Encounter: Payer: Self-pay | Admitting: Internal Medicine

## 2014-01-21 ENCOUNTER — Encounter (HOSPITAL_COMMUNITY): Payer: Self-pay | Admitting: Internal Medicine

## 2014-01-21 VITALS — BP 165/74 | HR 115 | Temp 100.3°F

## 2014-01-21 DIAGNOSIS — E1121 Type 2 diabetes mellitus with diabetic nephropathy: Secondary | ICD-10-CM | POA: Diagnosis present

## 2014-01-21 DIAGNOSIS — M869 Osteomyelitis, unspecified: Secondary | ICD-10-CM | POA: Insufficient documentation

## 2014-01-21 DIAGNOSIS — K210 Gastro-esophageal reflux disease with esophagitis: Secondary | ICD-10-CM | POA: Diagnosis present

## 2014-01-21 DIAGNOSIS — F17210 Nicotine dependence, cigarettes, uncomplicated: Secondary | ICD-10-CM | POA: Diagnosis present

## 2014-01-21 DIAGNOSIS — A419 Sepsis, unspecified organism: Secondary | ICD-10-CM | POA: Diagnosis present

## 2014-01-21 DIAGNOSIS — M25462 Effusion, left knee: Secondary | ICD-10-CM

## 2014-01-21 DIAGNOSIS — T8740 Infection of amputation stump, unspecified extremity: Secondary | ICD-10-CM

## 2014-01-21 DIAGNOSIS — E1142 Type 2 diabetes mellitus with diabetic polyneuropathy: Secondary | ICD-10-CM | POA: Diagnosis present

## 2014-01-21 DIAGNOSIS — L0390 Cellulitis, unspecified: Secondary | ICD-10-CM

## 2014-01-21 DIAGNOSIS — Z794 Long term (current) use of insulin: Secondary | ICD-10-CM | POA: Diagnosis not present

## 2014-01-21 DIAGNOSIS — Z89432 Acquired absence of left foot: Secondary | ICD-10-CM

## 2014-01-21 DIAGNOSIS — Z905 Acquired absence of kidney: Secondary | ICD-10-CM | POA: Diagnosis present

## 2014-01-21 DIAGNOSIS — D649 Anemia, unspecified: Secondary | ICD-10-CM

## 2014-01-21 DIAGNOSIS — I739 Peripheral vascular disease, unspecified: Secondary | ICD-10-CM

## 2014-01-21 DIAGNOSIS — N182 Chronic kidney disease, stage 2 (mild): Secondary | ICD-10-CM | POA: Diagnosis present

## 2014-01-21 DIAGNOSIS — L03116 Cellulitis of left lower limb: Secondary | ICD-10-CM

## 2014-01-21 DIAGNOSIS — T8744 Infection of amputation stump, left lower extremity: Principal | ICD-10-CM

## 2014-01-21 DIAGNOSIS — K219 Gastro-esophageal reflux disease without esophagitis: Secondary | ICD-10-CM

## 2014-01-21 DIAGNOSIS — E118 Type 2 diabetes mellitus with unspecified complications: Secondary | ICD-10-CM

## 2014-01-21 DIAGNOSIS — Z89511 Acquired absence of right leg below knee: Secondary | ICD-10-CM | POA: Diagnosis not present

## 2014-01-21 DIAGNOSIS — Z79899 Other long term (current) drug therapy: Secondary | ICD-10-CM

## 2014-01-21 DIAGNOSIS — F1910 Other psychoactive substance abuse, uncomplicated: Secondary | ICD-10-CM

## 2014-01-21 DIAGNOSIS — K2210 Ulcer of esophagus without bleeding: Secondary | ICD-10-CM

## 2014-01-21 DIAGNOSIS — Y835 Amputation of limb(s) as the cause of abnormal reaction of the patient, or of later complication, without mention of misadventure at the time of the procedure: Secondary | ICD-10-CM | POA: Diagnosis present

## 2014-01-21 DIAGNOSIS — I129 Hypertensive chronic kidney disease with stage 1 through stage 4 chronic kidney disease, or unspecified chronic kidney disease: Secondary | ICD-10-CM | POA: Diagnosis present

## 2014-01-21 DIAGNOSIS — F329 Major depressive disorder, single episode, unspecified: Secondary | ICD-10-CM | POA: Diagnosis present

## 2014-01-21 DIAGNOSIS — D638 Anemia in other chronic diseases classified elsewhere: Secondary | ICD-10-CM | POA: Diagnosis present

## 2014-01-21 DIAGNOSIS — E1165 Type 2 diabetes mellitus with hyperglycemia: Secondary | ICD-10-CM | POA: Diagnosis present

## 2014-01-21 DIAGNOSIS — E11649 Type 2 diabetes mellitus with hypoglycemia without coma: Secondary | ICD-10-CM | POA: Diagnosis not present

## 2014-01-21 DIAGNOSIS — R6889 Other general symptoms and signs: Secondary | ICD-10-CM | POA: Diagnosis present

## 2014-01-21 DIAGNOSIS — IMO0002 Reserved for concepts with insufficient information to code with codable children: Secondary | ICD-10-CM

## 2014-01-21 DIAGNOSIS — Z89512 Acquired absence of left leg below knee: Secondary | ICD-10-CM | POA: Insufficient documentation

## 2014-01-21 DIAGNOSIS — E1140 Type 2 diabetes mellitus with diabetic neuropathy, unspecified: Secondary | ICD-10-CM

## 2014-01-21 DIAGNOSIS — F1290 Cannabis use, unspecified, uncomplicated: Secondary | ICD-10-CM | POA: Diagnosis present

## 2014-01-21 DIAGNOSIS — N119 Chronic tubulo-interstitial nephritis, unspecified: Secondary | ICD-10-CM

## 2014-01-21 DIAGNOSIS — E785 Hyperlipidemia, unspecified: Secondary | ICD-10-CM | POA: Diagnosis present

## 2014-01-21 DIAGNOSIS — M25562 Pain in left knee: Secondary | ICD-10-CM

## 2014-01-21 DIAGNOSIS — I10 Essential (primary) hypertension: Secondary | ICD-10-CM

## 2014-01-21 DIAGNOSIS — E162 Hypoglycemia, unspecified: Secondary | ICD-10-CM

## 2014-01-21 DIAGNOSIS — F17200 Nicotine dependence, unspecified, uncomplicated: Secondary | ICD-10-CM

## 2014-01-21 HISTORY — DX: Osteomyelitis, unspecified: M86.9

## 2014-01-21 HISTORY — DX: Acquired absence of left leg below knee: Z89.512

## 2014-01-21 LAB — CBC WITH DIFFERENTIAL/PLATELET
BASOS ABS: 0 10*3/uL (ref 0.0–0.1)
Basophils Relative: 0 % (ref 0–1)
EOS ABS: 0 10*3/uL (ref 0.0–0.7)
Eosinophils Relative: 0 % (ref 0–5)
HCT: 34.9 % — ABNORMAL LOW (ref 39.0–52.0)
Hemoglobin: 11.9 g/dL — ABNORMAL LOW (ref 13.0–17.0)
Lymphocytes Relative: 8 % — ABNORMAL LOW (ref 12–46)
Lymphs Abs: 1.5 10*3/uL (ref 0.7–4.0)
MCH: 30.6 pg (ref 26.0–34.0)
MCHC: 34.1 g/dL (ref 30.0–36.0)
MCV: 89.7 fL (ref 78.0–100.0)
Monocytes Absolute: 1.5 10*3/uL — ABNORMAL HIGH (ref 0.1–1.0)
Monocytes Relative: 8 % (ref 3–12)
NEUTROS PCT: 84 % — AB (ref 43–77)
Neutro Abs: 15.9 10*3/uL — ABNORMAL HIGH (ref 1.7–7.7)
PLATELETS: 308 10*3/uL (ref 150–400)
RBC: 3.89 MIL/uL — ABNORMAL LOW (ref 4.22–5.81)
RDW: 13.3 % (ref 11.5–15.5)
WBC: 18.9 10*3/uL — ABNORMAL HIGH (ref 4.0–10.5)

## 2014-01-21 LAB — COMPLETE METABOLIC PANEL WITH GFR
ALK PHOS: 125 U/L — AB (ref 39–117)
ALT: 10 U/L (ref 0–53)
AST: 12 U/L (ref 0–37)
Albumin: 3.5 g/dL (ref 3.5–5.2)
BUN: 14 mg/dL (ref 6–23)
CO2: 21 mEq/L (ref 19–32)
Calcium: 9.5 mg/dL (ref 8.4–10.5)
Chloride: 104 mEq/L (ref 96–112)
Creat: 1.51 mg/dL — ABNORMAL HIGH (ref 0.50–1.35)
GFR, Est African American: 62 mL/min
GFR, Est Non African American: 54 mL/min — ABNORMAL LOW
Glucose, Bld: 112 mg/dL — ABNORMAL HIGH (ref 70–99)
Potassium: 4.2 mEq/L (ref 3.5–5.3)
SODIUM: 141 meq/L (ref 135–145)
TOTAL PROTEIN: 8.4 g/dL — AB (ref 6.0–8.3)
Total Bilirubin: 0.6 mg/dL (ref 0.3–1.2)

## 2014-01-21 LAB — PROCALCITONIN: Procalcitonin: 0.16 ng/mL

## 2014-01-21 LAB — GLUCOSE, CAPILLARY
GLUCOSE-CAPILLARY: 106 mg/dL — AB (ref 70–99)
GLUCOSE-CAPILLARY: 122 mg/dL — AB (ref 70–99)
GLUCOSE-CAPILLARY: 144 mg/dL — AB (ref 70–99)

## 2014-01-21 LAB — POCT GLYCOSYLATED HEMOGLOBIN (HGB A1C): Hemoglobin A1C: 9.6

## 2014-01-21 LAB — LACTIC ACID, PLASMA: LACTIC ACID: 1 mmol/L (ref 0.5–2.2)

## 2014-01-21 MED ORDER — INSULIN GLARGINE 100 UNIT/ML ~~LOC~~ SOLN
25.0000 [IU] | Freq: Every day | SUBCUTANEOUS | Status: DC
  Administered 2014-01-21: 25 [IU] via SUBCUTANEOUS
  Filled 2014-01-21 (×2): qty 0.25

## 2014-01-21 MED ORDER — VANCOMYCIN HCL IN DEXTROSE 1-5 GM/200ML-% IV SOLN
1000.0000 mg | Freq: Two times a day (BID) | INTRAVENOUS | Status: DC
  Administered 2014-01-22 – 2014-01-25 (×6): 1000 mg via INTRAVENOUS
  Filled 2014-01-21 (×7): qty 200

## 2014-01-21 MED ORDER — SODIUM CHLORIDE 0.9 % IV SOLN
INTRAVENOUS | Status: AC
  Administered 2014-01-22: 01:00:00 via INTRAVENOUS

## 2014-01-21 MED ORDER — INSULIN ASPART 100 UNIT/ML ~~LOC~~ SOLN
0.0000 [IU] | Freq: Three times a day (TID) | SUBCUTANEOUS | Status: DC
  Administered 2014-01-22: 5 [IU] via SUBCUTANEOUS
  Administered 2014-01-22: 3 [IU] via SUBCUTANEOUS

## 2014-01-21 MED ORDER — OXYCODONE-ACETAMINOPHEN 5-325 MG PO TABS
1.0000 | ORAL_TABLET | ORAL | Status: DC | PRN
  Administered 2014-01-21 – 2014-01-22 (×4): 1 via ORAL
  Filled 2014-01-21 (×4): qty 1

## 2014-01-21 MED ORDER — PANTOPRAZOLE SODIUM 40 MG PO TBEC: 40.0000 mg | DELAYED_RELEASE_TABLET | Freq: Every day | ORAL | Status: DC | PRN

## 2014-01-21 MED ORDER — PIPERACILLIN-TAZOBACTAM 3.375 G IVPB
3.3750 g | Freq: Three times a day (TID) | INTRAVENOUS | Status: DC
  Administered 2014-01-22 – 2014-01-25 (×9): 3.375 g via INTRAVENOUS
  Filled 2014-01-21 (×11): qty 50

## 2014-01-21 MED ORDER — PIPERACILLIN-TAZOBACTAM 3.375 G IVPB 30 MIN
3.3750 g | Freq: Once | INTRAVENOUS | Status: AC
  Administered 2014-01-22: 3.375 g via INTRAVENOUS
  Filled 2014-01-21: qty 50

## 2014-01-21 MED ORDER — HEPARIN SODIUM (PORCINE) 5000 UNIT/ML IJ SOLN
5000.0000 [IU] | Freq: Three times a day (TID) | INTRAMUSCULAR | Status: DC
  Administered 2014-01-21 – 2014-01-25 (×10): 5000 [IU] via SUBCUTANEOUS
  Filled 2014-01-21 (×14): qty 1

## 2014-01-21 MED ORDER — GABAPENTIN 600 MG PO TABS
600.0000 mg | ORAL_TABLET | Freq: Three times a day (TID) | ORAL | Status: DC
  Administered 2014-01-21 – 2014-01-25 (×9): 600 mg via ORAL
  Filled 2014-01-21 (×13): qty 1

## 2014-01-21 MED ORDER — VANCOMYCIN HCL 10 G IV SOLR
2000.0000 mg | Freq: Once | INTRAVENOUS | Status: AC
  Administered 2014-01-22: 2000 mg via INTRAVENOUS
  Filled 2014-01-21: qty 2000

## 2014-01-21 MED ORDER — FLUOXETINE HCL 10 MG PO CAPS
10.0000 mg | ORAL_CAPSULE | Freq: Every day | ORAL | Status: DC
  Administered 2014-01-21 – 2014-01-25 (×5): 10 mg via ORAL
  Filled 2014-01-21 (×6): qty 1

## 2014-01-21 MED ORDER — INSULIN ASPART 100 UNIT/ML ~~LOC~~ SOLN: 4.0000 [IU] | Freq: Three times a day (TID) | SUBCUTANEOUS | Status: DC

## 2014-01-21 MED ORDER — INSULIN ASPART 100 UNIT/ML ~~LOC~~ SOLN: 0.0000 [IU] | Freq: Every day | SUBCUTANEOUS | Status: DC

## 2014-01-21 NOTE — Progress Notes (Signed)
Patient ID: MARVELL TAMER, male   DOB: 1966/03/15, 48 y.o.   MRN: [id number]   Subjective:   HPI: Mr.Montrail B Dalesandro is a 48 y.o. 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  Reason(s) for this visit: 1. Left lower extremity pain: Patient reports that over the last 3-4 days, he has noticed increased pain in his stump of the left leg. Normally he is able to move his left stump without problems/pain, but has not been able to move it much past 2 days due to pain. He also complains of fevers and chills, and reduced energy. However, he does not report any nausea, vomiting, or change of appetite. He has pain in the left lower extremity stump has been increasingly worse, sharp, 10 out of 10 and constant. He does not take outpatient opiates. On examination, temp 100.3, pulse 115 with a blood pressure of 165/74.  ROS: Respiratory: Denies SOB, DOE, chest tightness, and wheezing. Denies chest pain. Reports a nonproductive cough for a few days. CVS: No chest pain, palpitations and leg swelling.  GI: No abdominal pain, nausea, vomiting, bloody stools GU: No dysuria, frequency, hematuria, or flank pain.  MSK: Right BKA stump without problems. Currently, is wheelchair bound. Psych: No depression symptoms. No SI or SA.    Objective:  Physical Exam: Filed Vitals:   01/21/14 1524  BP: 165/74  Pulse: 115  Temp: 100.3 F (37.9 C)  TempSrc: Oral  SpO2: 100%   General: Well nourished. No acute distress.  HEENT: Normal oral mucosa. MMM.  Lungs: CTA bilaterally. Heart: RRR; no extra sounds or murmurs  Abdomen: Non-distended, normal bowel sounds, soft, nontender; no hepatosplenomegaly  Extremities: Right BKA stump appears well-healed without any signs of infection or inflammation. Left transtibial stump has increased warmth, and a small area that exhibits a possibility of active infection, with mild to  drainage. It is tender to touch. It looks hyperemic. Neurologic: Normal EOM,  Alert and oriented x3. No obvious neurologic/cranial nerve deficits.  Assessment & Plan:  Discussed case with my attending in the clinic, Dr. Daryll Drown. See problem based charting.   #Left Amputation stump cellulitis with sepsis: Due to concern for left transtibial stump infection/cellulitis, patient will be admitted to a MedSurg bed for antibiotic therapy. Osteomyelitis will need to be excluded in consultation with Dr. Linwood Dibbles have discussed with the on-call IM resident.. I will order labs, including stat CBC with diff, and a BMP, lactic acid, blood cultures.   Signed:  [name], MD PGY-3 Internal Medicine Teaching Service Pager: [phone] 01/21/2014, 4:14 PM

## 2014-01-21 NOTE — H&P (Signed)
Date: 01/21/2014               Patient Name:  [name] MRN: [id number]  DOB: [birth year] Age / Sex: 48 y.o., male   PCP: [name], MD              Medical Service: Internal Medicine Teaching Service              Attending Physician: Dr. Annia Belt, MD    First Contact: Dr. Lorie Phenix Pager: [phone]  Second Contact: Dr. Heber Chidester Pager: [phone]            After Hours (After 5p/  First Contact Pager: [phone]  weekends / holidays): Second Contact Pager: [phone]   Chief Complaint:  L stump pain  History of Present Illness: [name] is a 48 year old man with history of HTN, HLD, uncontrolled DM2 with peripheral neuropathy, foot ulcers and osteomyelitis s/p bilateral BKA, CKD stage 2, chronic pyelonephritis s/p left nephrectomy, GERD with ulcerative esophagitis, depression, and polysubstance abuse presenting with pain at the site of his L BKA.  [name] presented to [facility] today for routine follow up.  Since yesterday evening, he noted increased pain at the site of his L below knee amputation, which was done by Dr. Sharol Given on 12/12/13.  He says that his stump had been healing well since the operation until last night when he noticed increased swelling and pain when trying to use his shrinker.  He then noted chills and rigors last night while trying to sleep and constant sharp pain with palpation of the stump that has persisted to today.  The stump has been draining clear fluid since the procedure, and he hasn't noticed any change in the color, odor, or amount of discharge.  He reports some associated fatigue, nausea, and decreased appetite today, but he denies any vomiting.  Review of Systems: Review of Systems  Constitutional: Positive for fever, chills, malaise/fatigue and diaphoresis. Negative for weight loss.  HENT: Negative for congestion and sore throat.   Eyes: Negative for blurred vision.  Respiratory: Negative for cough, sputum production and wheezing.     Cardiovascular: Positive for leg swelling. Negative for chest pain, palpitations, orthopnea and claudication.  Gastrointestinal: Positive for nausea. Negative for vomiting, abdominal pain, diarrhea and constipation.  Genitourinary: Negative for dysuria and hematuria.  Musculoskeletal: Positive for joint pain. Negative for myalgias.  Skin: Negative for itching and rash.  Neurological: Negative for dizziness, sensory change, focal weakness, weakness and headaches.  Psychiatric/Behavioral: Negative for depression.    Meds: Medications Prior to Admission  Medication Sig Dispense Refill  . FLUoxetine (PROZAC) 10 MG capsule Take 10 mg by mouth daily.      Marland Kitchen gabapentin (NEURONTIN) 600 MG tablet Take 600 mg by mouth 3 (three) times daily.      . insulin aspart (NOVOLOG) 100 UNIT/ML injection Inject 5 Units into the skin 3 (three) times daily before meals.      . insulin glargine (LANTUS) 100 UNIT/ML injection Inject 36 Units into the skin at bedtime.      . pantoprazole (PROTONIX) 40 MG tablet Take 40 mg by mouth daily as needed (for acid reflux).       Current Facility-Administered Medications  Medication Dose Route Frequency Provider Last Rate Last Dose  . FLUoxetine (PROZAC) capsule 10 mg  10 mg Oral Daily Jones Bales, MD   10 mg at 01/21/14 2204  . gabapentin (NEURONTIN) tablet 600 mg  600 mg Oral  TID Jones Bales, MD   600 mg at 01/21/14 2204  . heparin injection 5,000 Units  5,000 Units Subcutaneous 3 times per day Jones Bales, MD   5,000 Units at 01/21/14 2205  . [START ON 01/22/2014] insulin aspart (novoLOG) injection 0-15 Units  0-15 Units Subcutaneous TID WC Lucious Groves, DO      . insulin aspart (novoLOG) injection 0-5 Units  0-5 Units Subcutaneous QHS Lucious Groves, DO      . [START ON 01/22/2014] insulin aspart (novoLOG) injection 4 Units  4 Units Subcutaneous TID WC Lucious Groves, DO      . insulin glargine (LANTUS) injection 25 Units  25 Units Subcutaneous QHS  Lucious Groves, DO   25 Units at 01/21/14 2204  . oxyCODONE-acetaminophen (PERCOCET/ROXICET) 5-325 MG per tablet 1 tablet  1 tablet Oral Q4H PRN Lucious Groves, DO   1 tablet at 01/21/14 1952  . pantoprazole (PROTONIX) EC tablet 40 mg  40 mg Oral Daily PRN Jones Bales, MD        Allergies: Allergies as of 01/21/2014  . (No Known Allergies)   Past Medical History  Diagnosis Date  . Onychomycosis   . Hypertension   . Diabetic peripheral neuropathy   . Diabetic foot ulcer     s/p Right first ray amputation, left transmetatarsal amputation with revision  . History of drug abuse     Cocaine and marijuana  . Exposure to trichomonas     treated empirically  . Cellulitis of left foot 03/2008    left 4th and 5th metatarsal area  . Hyperlipidemia   . 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 Ulcerative esophagitis 8921    severe, complicated with UGI bleed  . Mallory [name] tear April 2009  . History of prolonged Q-T interval on ECG   . 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  . 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 CKD (chronic kidney disease) stage 2, GFR 60-89 ml/min     BL SCr 1.3-1.4  . Depression   . Peripheral vascular disease   . Type 2 diabetes mellitus, uncontrolled, with renal complications   . GERD (gastroesophageal reflux disease)   . 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 Toe amputation  06/2011    left; 4th toe  . Nephrectomy      L side  . I&d extremity  08/24/2011    Procedure: IRRIGATION AND DEBRIDEMENT EXTREMITY;  Surgeon: Meredith Pel,  MD;  Location: [place];  Service: Orthopedics;  Laterality: Left;  . Amputation  08/24/2011    Procedure: AMPUTATION RAY;  Surgeon: Meredith Pel, MD;  Location: [place];  Service: Orthopedics;  Laterality: Left;  left fourth toe Ray Resection  . Amputation  12/05/2011    Procedure: AMPUTATION RAY;  Surgeon: Meredith Pel, MD;  Location: [place];  Service: Orthopedics;  Laterality: Left;  . I&d extremity  02/18/2012    Procedure: IRRIGATION AND DEBRIDEMENT EXTREMITY;  Surgeon: Mcarthur Rossetti, MD;  Location: [facility];  Service: Orthopedics;  Laterality: Left;  . Amputation  02/18/2012    Procedure: AMPUTATION DIGIT;  Surgeon: Mcarthur Rossetti, MD;  Location: [place];  Service: Orthopedics;  Laterality: Left;  revison transmetatarsal  . I&d extremity Left 08/28/2012    Procedure: IRRIGATION AND DEBRIDEMENT EXTREMITY;  Surgeon: Linna Hoff, MD;  Location: [place];  Service: Orthopedics;  Laterality: Left;  CYSTO TUBING/IRRIGATION, LEAD HAND.  . I&d extremity Left 08/30/2012    Procedure: IRRIGATION AND DEBRIDEMENT EXTREMITY;  Surgeon: Linna Hoff, MD;  Location: [place];  Service: Orthopedics;  Laterality: Left;  . Amputation Right 09/01/2012    Procedure: FOOT 1ST RAY AMPUTATION;  Surgeon: Newt Minion, MD;  Location: [place];  Service: Orthopedics;  Laterality: Right;  . Amputation Left 09/01/2012    Procedure: FOOT REVISION TRANSMETATARSAL AMPUTATION ;  Surgeon: Newt Minion, MD;  Location: [place];  Service: Orthopedics;  Laterality: Left;  . Amputation Right 09/13/2012    Procedure: AMPUTATION BELOW KNEE;  Surgeon: Newt Minion, MD;  Location: [place];  Service: Orthopedics;  Laterality: Right;  Right Below Knee Amputation  . Amputation Left 12/12/2013    Procedure: Left Below Knee Amputation;  Surgeon: Newt Minion, MD;  Location: [place];  Service: Orthopedics;  Laterality: Left;   Family History  Problem Relation Age of Onset  . Diabetes type II Mother   . Pancreatic cancer Father     History   Social History  . Marital Status: Married    Spouse Name: N/A    Number of Children: N/A  . Years of Education: N/A   Occupational History  . Not on file.   Social History Main Topics  . Smoking status: Current Every Day Smoker -- 0.50 packs/day for 34 years    Types: Cigarettes  . Smokeless tobacco: Never Used     Comment: 1/2 PPD  . Alcohol Use: Yes     Comment: occ  . Drug Use: No     Comment: NO LONGER USES-GRADUATING FROM GEO CRE AFTER  3-4 MONTHS                                                                                                                                       . Sexual Activity: Not on file   Other Topics Concern  . Not on file   Social History Narrative  . No narrative on file    Physical Exam: Filed Vitals:   01/21/14 2138  BP: 162/90  Pulse: 112  Temp: 99 F (37.2 C)  Resp: 18   Physical Exam  Constitutional: He is oriented to person, place, and time and well-developed, well-nourished, and in no distress. No distress.  HENT:  Head: Normocephalic and atraumatic.  Eyes: Conjunctivae and EOM are normal. Pupils are equal, round, and reactive to light. No scleral icterus.  Neck: Normal range of motion. Neck supple.  Cardiovascular: Regular rhythm and intact distal pulses.  Exam reveals no gallop and no friction rub.   No murmur heard. Tachycardic.  Pulmonary/Chest: Effort normal and breath sounds normal.  Abdominal: Soft. Bowel sounds are normal. He exhibits no distension. There is no tenderness.  Musculoskeletal: He exhibits edema (L stump.) and tenderness.  Decreased range of motion in L knee due to pain.  Neurological: He is alert and oriented to person, place, and time. He exhibits normal muscle tone.  Skin: Skin is warm. He is diaphoretic. There is erythema (L stump, draining serosangionous fluid.).  Warm left stump with moist scabs along incision site.    Lab results: Basic Metabolic Panel:  Recent Labs   01/21/14 1640  NA 141  K 4.2  CL 104  CO2 21  GLUCOSE 112*  BUN 14  CREATININE 1.51*  CALCIUM 9.5   Liver Function Tests:  Recent Labs  01/21/14 1640  AST 12  ALT 10  ALKPHOS 125*  BILITOT 0.6  PROT 8.4*  ALBUMIN 3.5   CBC:  Recent Labs  01/21/14 1640  WBC 18.9*  NEUTROABS 15.9*  HGB 11.9*  HCT 34.9*  MCV 89.7  PLT 308   CBG:  Recent Labs  01/21/14 1518 01/21/14 1827 01/21/14 2124  GLUCAP 106* 122* 144*   Hemoglobin A1C:  Recent Labs  01/21/14 1614  HGBA1C 9.6    Assessment & Plan by Problem: Active Problems:   Cellulitis   #Cellulitis of left amputation stump with sepsis Recently underwent L BKA due to osteomyelitis and ulceration of left foot.  Temp of 100.2 with WBC of 18.9 and tachycardic, so 2/4 SIRS.  Site with signs of active infection.  No change in drainage, but erythematous and warm.  History of recurrent infections.  Previous wound cultures have grown proteus mirabilis, group b strep, and MSSA.  No history of pseudomonas, but he is at risk given recurrent infections. -Consult Dr. Sharol Given of orthopedics for management. -Follow up blood cultures. -Wound culture. -CRP and procalcitonin. -X-ray of foot. -Consider MRI if foot x-ray normal. -Start vanc/Zosyn for empiric coverage of diabetic osteomyelitis, narrow per culture results. -Normal saline at 100 ml/hr. -Percocet 5-325 mg q4h PRN. -Consult PT/OT. -Carb modified diet. -Check HIV antibody.  #Uncontrolled DM2 with peripheral neuropathy A1C 9.6 today. Takes Lantus 36 units QHS with Novolog 5 units AC. -Lantus 25 units QHS and Novolog 4 units AC. -CBGs and SSI moderate ACHS. -Continue home gabapentin 600 mg TID.  #CKD2 s/p left nephrectomy for chronic pyelonephritis Creatinine at baseline 1.51. -Continue to monitor BMP. -Check microalbumin and U/A.  #Hypertension Has been on BP meds in past, but not currently on meds and has been normotensive.  Beta blockers previously  discontinued to hypoglycemia episodes.  BP moderately elevated in clinic. -Continue to monitor.  #Hyperlipidemia LDL 59 a year ago on atorvastatin, not currently on statin. Was up to 129 in past. -Would benefit from restarting statin. -Check lipid profile.  #GERD with ulcerative esophagitis No reflux symptoms currently. -Continue Protonix 40 mg daily.  #Normocytic anemia Hgb at baseline 11.9. Last anemia panel in 2012 with normal ferritin.  Likely due to chronic disease. -Monitor CBC.  #Depression Mood stable. -Continue home Prozac 10 mg daily.  #Polysubstance abuse Reports last using cocaine a year ago, alcohol one beer per week, and marijuana use currently. -UDS.  #DVT prophylaxis -Heparin.  Dispo: Disposition is deferred at this time, awaiting improvement of current medical problems. Anticipated discharge in approximately 3-4 day(s).   The patient  does have a current PCP [name], MD), therefore will be require OPC follow-up after discharge.   The patient does have transportation limitations that hinder transportation to clinic appointments.   Signed:  Arman Filter, MD, PhD PGY-1 Internal Medicine Teaching Service Pager: [phone] 01/21/2014, 11:03 PM

## 2014-01-21 NOTE — Progress Notes (Signed)
ANTIBIOTIC CONSULT NOTE - INITIAL  Pharmacy Consult for Vancocin and Zosyn Indication: r/o osteomyelitis  No Known Allergies  Patient Measurements: Height: 5' 8.11" (173 cm) Weight: 228 lb 6.3 oz (103.6 kg) (double amputee) IBW/kg (Calculated) : 68.65  Vital Signs: Temp: 99 F (37.2 C) (10/14 2138) Temp Source: Oral (10/14 2138) BP: 162/90 mmHg (10/14 2138) Pulse Rate: 112 (10/14 2138)  Labs:  Recent Labs  01/21/14 1640  WBC 18.9*  HGB 11.9*  PLT 308  CREATININE 1.51*   Estimated Creatinine Clearance: 70 ml/min (by C-G formula based on Cr of 1.51).  Medical History: Past Medical History  Diagnosis Date  . Onychomycosis   . Hypertension   . Diabetic peripheral neuropathy   . Diabetic foot ulcer     s/p Right first ray amputation, left transmetatarsal amputation with revision  . History of drug abuse     Cocaine and marijuana  . Exposure to trichomonas     treated empirically  . Cellulitis of left foot 03/2008    left 4th and 5th metatarsal area  . Hyperlipidemia   . Alcoholic pancreatitis 62/7035  . Ulcerative esophagitis 0093    severe, complicated with UGI bleed  . Mallory [name] tear April 2009  . History of prolonged Q-T interval on ECG   . 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  . 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 CKD (chronic kidney disease) stage 2, GFR 60-89 ml/min     BL SCr 1.3-1.4  . Depression   . Peripheral vascular disease   . Type 2 diabetes mellitus, uncontrolled, with renal complications   . GERD (gastroesophageal reflux disease)   . 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 S/P bilateral BKA (below knee amputation) 12/12/13    L  BKA    Medications:  Prescriptions prior to admission  Medication Sig Dispense Refill  . FLUoxetine (PROZAC) 10 MG capsule Take 10 mg by mouth daily.      Marland Kitchen gabapentin (NEURONTIN) 600 MG tablet Take 600 mg by mouth 3 (three) times daily.      . insulin aspart (NOVOLOG) 100 UNIT/ML injection Inject 5 Units into the skin 3 (three) times daily before meals.      . insulin glargine (LANTUS) 100 UNIT/ML injection Inject 36 Units into the skin at bedtime.      . pantoprazole (PROTONIX) 40 MG tablet Take 40 mg by mouth daily as needed (for acid reflux).       Scheduled:  . FLUoxetine  10 mg Oral Daily  . gabapentin  600 mg Oral TID  . heparin  5,000 Units Subcutaneous 3 times per day  . [START ON 01/22/2014] insulin aspart  0-15 Units Subcutaneous TID WC  . insulin aspart  0-5 Units Subcutaneous QHS  . [START ON 01/22/2014] insulin aspart  4 Units Subcutaneous TID WC  . insulin glargine  25 Units Subcutaneous QHS    Assessment: 48yo male c/o LLE pain x3-4d (s/p BKA in early September) that affects ability to move stump, Xray shows subcutaneous gas reflecting ulceration or necrotizing infection, cannot exclude osteomyelitis, to begin IV ABX.  Goal of Therapy:  Vancomycin trough level 15-20 mcg/ml  Plan:  Will give vancomycin 2000mg  IV x1 followed by 1000mg  IV Q12H and monitor CBC, Cx, levels prn.  Wynona Neat, PharmD, BCPS  01/21/2014,11:10 PM

## 2014-01-21 NOTE — Progress Notes (Signed)
Pt refuses bed alarm at this time. States "I move a lot and it will keep going off. I won't be able to get any sleep tonight". Significant other at bedside. Educated on bed alarm safety and calling for assistance prior to OOB. Pt and significant other is aware and acknowledges. Will utilize call bell light when in need of assistance. Call bell light within reach.

## 2014-01-21 NOTE — Patient Instructions (Signed)
Patient will be admitted today.

## 2014-01-22 ENCOUNTER — Encounter (HOSPITAL_COMMUNITY): Payer: Self-pay | Admitting: *Deleted

## 2014-01-22 DIAGNOSIS — E118 Type 2 diabetes mellitus with unspecified complications: Secondary | ICD-10-CM

## 2014-01-22 DIAGNOSIS — L03116 Cellulitis of left lower limb: Secondary | ICD-10-CM

## 2014-01-22 DIAGNOSIS — Z720 Tobacco use: Secondary | ICD-10-CM

## 2014-01-22 LAB — URINALYSIS, ROUTINE W REFLEX MICROSCOPIC
BILIRUBIN URINE: NEGATIVE
GLUCOSE, UA: NEGATIVE mg/dL
KETONES UR: NEGATIVE mg/dL
Leukocytes, UA: NEGATIVE
Nitrite: NEGATIVE
Protein, ur: 100 mg/dL — AB
Specific Gravity, Urine: 1.027 (ref 1.005–1.030)
Urobilinogen, UA: 1 mg/dL (ref 0.0–1.0)
pH: 5.5 (ref 5.0–8.0)

## 2014-01-22 LAB — HIV ANTIBODY (ROUTINE TESTING W REFLEX): HIV: NONREACTIVE

## 2014-01-22 LAB — CBC
HCT: 31.8 % — ABNORMAL LOW (ref 39.0–52.0)
Hemoglobin: 10.6 g/dL — ABNORMAL LOW (ref 13.0–17.0)
MCH: 29.2 pg (ref 26.0–34.0)
MCHC: 33.3 g/dL (ref 30.0–36.0)
MCV: 87.6 fL (ref 78.0–100.0)
Platelets: 297 10*3/uL (ref 150–400)
RBC: 3.63 MIL/uL — ABNORMAL LOW (ref 4.22–5.81)
RDW: 13.2 % (ref 11.5–15.5)
WBC: 17.7 10*3/uL — ABNORMAL HIGH (ref 4.0–10.5)

## 2014-01-22 LAB — BASIC METABOLIC PANEL
ANION GAP: 16 — AB (ref 5–15)
BUN: 18 mg/dL (ref 6–23)
CO2: 20 mEq/L (ref 19–32)
CREATININE: 1.66 mg/dL — AB (ref 0.50–1.35)
Calcium: 8.7 mg/dL (ref 8.4–10.5)
Chloride: 102 mEq/L (ref 96–112)
GFR calc Af Amer: 55 mL/min — ABNORMAL LOW (ref >90)
GFR, EST NON AFRICAN AMERICAN: 47 mL/min — AB (ref >90)
Glucose, Bld: 84 mg/dL (ref 70–99)
Potassium: 3.7 mEq/L (ref 3.7–5.3)
SODIUM: 138 meq/L (ref 137–147)

## 2014-01-22 LAB — GLUCOSE, CAPILLARY
Glucose-Capillary: 59 mg/dL — ABNORMAL LOW (ref 70–99)
Glucose-Capillary: 149 mg/dL — ABNORMAL HIGH (ref 70–99)
Glucose-Capillary: 245 mg/dL — ABNORMAL HIGH (ref 70–99)
Glucose-Capillary: 180 mg/dL — ABNORMAL HIGH (ref 70–99)

## 2014-01-22 LAB — LIPID PANEL
CHOL/HDL RATIO: 2.3 ratio
Cholesterol: 162 mg/dL (ref 0–200)
HDL: 72 mg/dL (ref >39)
LDL Cholesterol: 78 mg/dL (ref 0–99)
Triglycerides: 62 mg/dL (ref <150)
VLDL: 12 mg/dL (ref 0–40)

## 2014-01-22 LAB — RAPID URINE DRUG SCREEN, HOSP PERFORMED
AMPHETAMINES: NOT DETECTED
BENZODIAZEPINES: NOT DETECTED
Barbiturates: NOT DETECTED
COCAINE: NOT DETECTED
Opiates: NOT DETECTED
Tetrahydrocannabinol: POSITIVE — AB

## 2014-01-22 LAB — URINE MICROSCOPIC-ADD ON
RBC / HPF: 3 RBC/hpf — AB (ref <3)
WBC UA: 0 WBC/hpf (ref <3)

## 2014-01-22 LAB — C-REACTIVE PROTEIN: CRP: 13.5 mg/dL — ABNORMAL HIGH (ref <0.60)

## 2014-01-22 MED ORDER — PNEUMOCOCCAL VAC POLYVALENT 25 MCG/0.5ML IJ INJ
0.5000 mL | INJECTION | INTRAMUSCULAR | Status: AC
  Administered 2014-01-24: 0.5 mL via INTRAMUSCULAR
  Filled 2014-01-22: qty 0.5

## 2014-01-22 MED ORDER — INSULIN ASPART 100 UNIT/ML ~~LOC~~ SOLN
3.0000 [IU] | Freq: Three times a day (TID) | SUBCUTANEOUS | Status: DC
  Administered 2014-01-22 (×2): 3 [IU] via SUBCUTANEOUS

## 2014-01-22 MED ORDER — INSULIN GLARGINE 100 UNIT/ML ~~LOC~~ SOLN
20.0000 [IU] | Freq: Every day | SUBCUTANEOUS | Status: DC
  Administered 2014-01-22: 20 [IU] via SUBCUTANEOUS
  Filled 2014-01-22 (×2): qty 0.2

## 2014-01-22 MED ORDER — NICOTINE 7 MG/24HR TD PT24
7.0000 mg | MEDICATED_PATCH | Freq: Every day | TRANSDERMAL | Status: DC
  Administered 2014-01-22: 7 mg via TRANSDERMAL
  Filled 2014-01-22 (×3): qty 1

## 2014-01-22 MED ORDER — INFLUENZA VAC SPLIT QUAD 0.5 ML IM SUSY
0.5000 mL | PREFILLED_SYRINGE | INTRAMUSCULAR | Status: AC
  Administered 2014-01-24: 0.5 mL via INTRAMUSCULAR
  Filled 2014-01-22: qty 0.5

## 2014-01-22 NOTE — Progress Notes (Signed)
I personally examined this patient today and concur with the evaluation and management plan as outlined by fourth year medical student Ms. Jake Bathe. Murriel Hopper, M.D., [place]

## 2014-01-22 NOTE — Progress Notes (Signed)
[name] (med student) informed of pt's CBG that dropped to 54. CBG increased to 149 after receiving breakfast. Patient was asymptomatic.  [name], RN.

## 2014-01-22 NOTE — H&P (Signed)
Medicine attending admission note: I personally interviewed and examined this patient, reviewed the lab database and pertinent radiographs, and I concur with the evaluation and management plan as recorded by resident physician Dr. Karle Starch Moding.  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 He admits to ongoing cocaine use. He is one year status post right below the knee amputation and  1 month post left below the knee amputation on September 4. He initially developed problems with bilateral diabetic foot ulcers complicated by development of osteomyelitis in November 2013. Initial amputation of the left fifth toe then a  revision transmetatarsal amputation of the left foot and ultimately the recent BKA. He now presents with a 24-hour history of increasing pain involving the left surgical stump. He has had shaking chills but did not take his temperature.  Initial exam by Dr. Trudee Kuster: There was tenderness and erythema of the left leg stump. No wound breakdown but some serosanguineous drainage.  Pertinent lab: White count 18,900 BUN 14, creatinine 1.5, Random glucose 112  X-rays of the left knee: Recent surgical change. Scattered areas of subcutaneous gas in the amputation stump. Minimal irregularity of the tibial osteotomy.  Current exam: Blood pressure 144/79, pulse 104, temperature 100 F (37.8 C), temperature source Oral, resp. rate 18, height 5' 8.11" (1.73 m), weight 228 lb 6.3 oz (103.6 kg), SpO2 100.00%. No change on my exam compared with that recorded by Dr. Trudee Kuster.  Impression: #1. Soft tissue infection left leg stump approximately 6 weeks post below the knee amputation He has low-grade fever. Leukocytosis. However, he is not toxic  appearing. Initial antibiotic started with vancomycin and Zosyn. His orthopedic surgeon has been consulted and will review the situation and give any additional recommendations. There is no gross wound dehiscence. No gross purulent discharge.  #2. Type 2 diabetes with associated nephropathy and neuropathy #3. Peripheral vascular disease #4. Polysubstance abuse #5. Essential hypertension #6. 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 Hyperlipidemia #8. History of ulcerative esophagitis secondary to reflux  Murriel Hopper, MD, FACP  Hematology-Oncology/Internal Medicine

## 2014-01-22 NOTE — Progress Notes (Signed)
Subjective: [name] denies fevers or chills last night. Reports leg pain. Also reports stump "popped" and is now draining serosanginous fluid. Is interested in quitting smoking during this admission. BS dropped to 59 overnight but came up to 140 after a snack.   Objective: Vital signs in last 24 hours: Filed Vitals:   01/21/14 2138 01/21/14 2300 01/22/14 0423 01/22/14 1039  BP: 162/90  136/76 144/79  Pulse: 112  99 104  Temp: 99 F (37.2 C)  99.9 F (37.7 C) 100 F (37.8 C)  TempSrc: Oral  Oral Oral  Resp: 18  18 18   Height:  5' 8.11" (1.73 m)    Weight:  103.6 kg (228 lb 6.3 oz)    SpO2: 100%  100% 100%   Weight change:   Intake/Output Summary (Last 24 hours) at 01/22/14 1140 Last data filed at 01/22/14 0940  Gross per 24 hour  Intake    360 ml  Output    750 ml  Net   -390 ml   Constitutional: No acute distress, well-nourished. HENT:  Head: Normocephalic and atraumatic.  Eyes: Conjunctivae and EOM are normal. Pupils are equal, round, and reactive to light. No scleral icterus.  Cardiovascular: Regular rate and rhythm, no MRG Pulmonary/Chest: Effort normal and breath sounds normal. No wheezes or crackles. Abdominal: Soft, nontender Neurological: He is alert and oriented to person, place, and time. He exhibits normal muscle tone.  Skin: Skin is warm. L leg stump erythema and edema, draining serosanguinous fluid, well healing incision site  Lab Results: Results for orders placed during the hospital encounter of 01/21/14 (from the past 24 hour(s))  GLUCOSE, CAPILLARY     Status: Abnormal   Collection Time    01/21/14  6:27 PM      Result Value Ref Range   Glucose-Capillary 122 (*) 70 - 99 mg/dL  GLUCOSE, CAPILLARY     Status: Abnormal   Collection Time    01/21/14  9:24 PM      Result Value Ref Range   Glucose-Capillary 144 (*) 70 - 99 mg/dL  C-REACTIVE PROTEIN     Status: Abnormal   Collection Time    01/21/14 10:11 PM      Result Value Ref Range   CRP 13.5 (*)  <0.60 mg/dL  PROCALCITONIN     Status: None   Collection Time    01/21/14 10:11 PM      Result Value Ref Range   Procalcitonin 2.44    BASIC METABOLIC PANEL     Status: Abnormal   Collection Time    01/22/14  6:01 AM      Result Value Ref Range   Sodium 138  137 - 147 mEq/L   Potassium 3.7  3.7 - 5.3 mEq/L   Chloride 102  96 - 112 mEq/L   CO2 20  19 - 32 mEq/L   Glucose, Bld 84  70 - 99 mg/dL   BUN 18  6 - 23 mg/dL   Creatinine, Ser 1.66 (*) 0.50 - 1.35 mg/dL   Calcium 8.7  8.4 - 10.5 mg/dL   GFR calc non Af Amer 47 (*) >90 mL/min   GFR calc Af Amer 55 (*) >90 mL/min   Anion gap 16 (*) 5 - 15  CBC     Status: Abnormal   Collection Time    01/22/14  6:01 AM      Result Value Ref Range   WBC 17.7 (*) 4.0 - 10.5 K/uL   RBC 3.63 (*)  4.22 - 5.81 MIL/uL   Hemoglobin 10.6 (*) 13.0 - 17.0 g/dL   HCT 31.8 (*) 39.0 - 52.0 %   MCV 87.6  78.0 - 100.0 fL   MCH 29.2  26.0 - 34.0 pg   MCHC 33.3  30.0 - 36.0 g/dL   RDW 13.2  11.5 - 15.5 %   Platelets 297  150 - 400 K/uL  LIPID PANEL     Status: None   Collection Time    01/22/14  6:01 AM      Result Value Ref Range   Cholesterol 162  0 - 200 mg/dL   Triglycerides 62  <150 mg/dL   HDL 72  >39 mg/dL   Total CHOL/HDL Ratio 2.3     VLDL 12  0 - 40 mg/dL   LDL Cholesterol 78  0 - 99 mg/dL  GLUCOSE, CAPILLARY     Status: Abnormal   Collection Time    01/22/14  7:39 AM      Result Value Ref Range   Glucose-Capillary 59 (*) 70 - 99 mg/dL  GLUCOSE, CAPILLARY     Status: Abnormal   Collection Time    01/22/14  8:32 AM      Result Value Ref Range   Glucose-Capillary 149 (*) 70 - 99 mg/dL   Comment 1 Notify RN     Comment 2 Documented in Chart    URINALYSIS, ROUTINE W REFLEX MICROSCOPIC     Status: Abnormal   Collection Time    01/22/14 10:35 AM      Result Value Ref Range   Color, Urine YELLOW  YELLOW   APPearance CLEAR  CLEAR   Specific Gravity, Urine 1.027  1.005 - 1.030   pH 5.5  5.0 - 8.0   Glucose, UA NEGATIVE  NEGATIVE  mg/dL   Hgb urine dipstick TRACE (*) NEGATIVE   Bilirubin Urine NEGATIVE  NEGATIVE   Ketones, ur NEGATIVE  NEGATIVE mg/dL   Protein, ur 100 (*) NEGATIVE mg/dL   Urobilinogen, UA 1.0  0.0 - 1.0 mg/dL   Nitrite NEGATIVE  NEGATIVE   Leukocytes, UA NEGATIVE  NEGATIVE  URINE RAPID DRUG SCREEN (HOSP PERFORMED)     Status: Abnormal   Collection Time    01/22/14 10:35 AM      Result Value Ref Range   Opiates NONE DETECTED  NONE DETECTED   Cocaine NONE DETECTED  NONE DETECTED   Benzodiazepines NONE DETECTED  NONE DETECTED   Amphetamines NONE DETECTED  NONE DETECTED   Tetrahydrocannabinol POSITIVE (*) NONE DETECTED   Barbiturates NONE DETECTED  NONE DETECTED  URINE MICROSCOPIC-ADD ON     Status: Abnormal   Collection Time    01/22/14 10:35 AM      Result Value Ref Range   Squamous Epithelial / LPF RARE  RARE   WBC, UA 0  <3 WBC/hpf   RBC / HPF 3 (*) <3 RBC/hpf   Bacteria, UA FEW (*) RARE   Casts HYALINE CASTS (*) NEGATIVE   Micro Results: Recent Results (from the past 240 hour(s))  CULTURE, BLOOD (SINGLE)     Status: None   Collection Time    01/21/14  4:40 PM      Result Value Ref Range Status   Preliminary Report Blood Culture received; No Growth to date;   Preliminary   Preliminary Report Culture will be held for 5 days before issuing   Preliminary   Preliminary Report a Final Negative report.   Preliminary  CULTURE, BLOOD (SINGLE)  Status: None   Collection Time    01/21/14  4:48 PM      Result Value Ref Range Status   Preliminary Report Blood Culture received; No Growth to date;   Preliminary   Preliminary Report Culture will be held for 5 days before issuing   Preliminary   Preliminary Report a Final Negative report.   Preliminary   Studies/Results: Dg Knee 1-2 Views Left  01/21/2014   CLINICAL DATA:  Evaluate for osteomyelitis of leg stump.  EXAM: LEFT KNEE - 1-2 VIEW  COMPARISON:  None.  FINDINGS: Status post transtibial amputation 12/12/2013. Subcutaneous gas is  present at the level of the stump, unexpected this remote from surgery. There is mild fragmentation and irregularity of the tibial osteotomy margin, which could be residual from recent osteotomy. This finding does not allow exclusion of early osteomyelitis. The knee joint is unremarkable for age.  IMPRESSION: Subcutaneous gas in the amputation stump could be from ulceration or necrotizing infection. Minimal irregularity of the tibial osteotomy may be related to recent surgery, but cannot exclude osteomyelitis.   Electronically Signed   By: Jorje Guild M.D.   On: 01/21/2014 21:36   Medications: I have reviewed the patient's current medications. Scheduled Meds: . FLUoxetine  10 mg Oral Daily  . gabapentin  600 mg Oral TID  . heparin  5,000 Units Subcutaneous 3 times per day  . [START ON 01/23/2014] Influenza vac split quadrivalent PF  0.5 mL Intramuscular Tomorrow-1000  . insulin aspart  0-15 Units Subcutaneous TID WC  . insulin aspart  0-5 Units Subcutaneous QHS  . insulin aspart  3 Units Subcutaneous TID WC  . insulin glargine  20 Units Subcutaneous QHS  . nicotine  7 mg Transdermal Daily  . piperacillin-tazobactam (ZOSYN)  IV  3.375 g Intravenous Q8H  . [START ON 01/23/2014] pneumococcal 23 valent vaccine  0.5 mL Intramuscular Tomorrow-1000  . vancomycin  1,000 mg Intravenous Q12H   Continuous Infusions:  PRN Meds:.oxyCODONE-acetaminophen, pantoprazole Assessment/Plan: Principal Problem:   Cellulitis of leg Active Problems:   Diabetes mellitus type 2, uncontrolled, with complications   TOBACCO ABUSE   Essential hypertension   CKD Stage 2   Hypoglycemia   History of left below knee amputation  #Cellulitis of left amputation stump with sepsis  Recently underwent L BKA due to osteomyelitis and ulceration of left foot. Meet 2/4 SIRS criteria (WBC of 17.7 and tachycardic). WBC down today from 18.8 yesterday. Site with signs of active infection including erythema, edema, and warmth.  Previous wound cultures have grown proteus mirabilis, group b strep, and MSSA. No history of pseudomonas, but he is at risk given recurrent infections.  -Dr. Sharol Given consulted, will see the patient  -Blood cultures NGTD, continue following -Wound culture ordered -Vanc/Zosyn for empiric coverage of diabetic osteomyelitis, narrow per culture results -Normal saline at 100 ml/hr.  -Percocet 5-325 mg q4h PRN.  -Consult PT/OT.  -Carb modified diet.   #Uncontrolled DM2 with peripheral neuropathy: A1C 9.6  -Lantus 25 units QHS and Novolog 4 units AC.  -CBGs and SSI moderate ACHS.  -Continue home gabapentin 600 mg TID.   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  -Continue to monitor BMP.   #Hypertension: Has been on BP meds in past, but not currently on meds and has been normotensive. Will hold of for now due to sepsis.  -Continue to monitor.   #Hyperlipidemia: Not currently on statin. Total cholesterol 162, LDL 78  -Would benefit from restarting statin  as he is Diabetic with LDL > 70  #GERD with ulcerative esophagitis  No reflux symptoms currently.  -Continue Protonix 40 mg daily.   #Normocytic anemia  Hgb at baseline 11.9. Last anemia panel in 2012 with normal ferritin. Likely due to chronic disease.  -Monitor CBC.   #Depression  Mood stable.  -Continue home Prozac 10 mg daily.   #Polysubstance abuse  Reports last using cocaine a year ago, alcohol one beer per week, and marijuana use currently. UDS + for THC.  #DVT prophylaxis  -Heparin.  This is a Careers information officer Note.  The care of the patient was discussed with Dr. Heber [place] and the assessment and plan formulated with their assistance.  Please see their attached note for official documentation of the daily encounter.   LOS: 1 day   Ciro Backer, Med Student 01/22/2014, 11:40 AM

## 2014-01-22 NOTE — Progress Notes (Signed)
Patient's left bka stump noticed to be draining large amount of serosanguinous drainage. It was not draining this morning. Jake Bathe (med student) notified. Will monitor.  [name], RN.

## 2014-01-22 NOTE — Progress Notes (Signed)
Patient informed of the need to obtain urine sample. Supplies setup.  [name], RN.

## 2014-01-22 NOTE — Progress Notes (Signed)
Subjective: Patient reports he is feeling well without any acute complaints. Objective: Vital signs in last 24 hours: Filed Vitals:   01/21/14 2138 01/21/14 2300 01/22/14 0423 01/22/14 1039  BP: 162/90  136/76 144/79  Pulse: 112  99 104  Temp: 99 F (37.2 C)  99.9 F (37.7 C) 100 F (37.8 C)  TempSrc: Oral  Oral Oral  Resp: _0 Height:  5' 8.11" (1.73 m)    Weight:  228 lb 6.3 oz (103.6 kg)    SpO2: 100%  100% 100%   Weight change:   Intake/Output Summary (Last 24 hours) at 01/22/14 1053 Last data filed at 01/22/14 0940  Gross per 24 hour  Intake    360 ml  Output    750 ml  Net   -390 ml   General: resting in bed Cardiac: RRR, no rubs, murmurs or gallops Pulm: CTAB Abd: soft, nontender, nondistended, BS present Ext: left BKA swollen, tender, warm, and errythmatous, no active drainage. Neuro: alert and oriented  Lab Results: Basic Metabolic Panel:  Recent Labs Lab 01/21/14 1640 01/22/14 0601  NA 141 138  K 4.2 3.7  CL 104 102  CO2 21 20  GLUCOSE 112* 84  BUN 14 18  CREATININE 1.51* 1.66*  CALCIUM 9.5 8.7   Liver Function Tests:  Recent Labs Lab 01/21/14 1640  AST 12  ALT 10  ALKPHOS 125*  BILITOT 0.6  PROT 8.4*  ALBUMIN 3.5   No results found for this basename: LIPASE, AMYLASE,  in the last 168 hours No results found for this basename: AMMONIA,  in the last 168 hours CBC:  Recent Labs Lab 01/21/14 1640 01/22/14 0601  WBC 18.9* 17.7*  NEUTROABS 15.9*  --   HGB 11.9* 10.6*  HCT 34.9* 31.8*  MCV 89.7 87.6  PLT 308 297   Cardiac Enzymes: No results found for this basename: CKTOTAL, CKMB, CKMBINDEX, TROPONINI,  in the last 168 hours BNP: No results found for this basename: PROBNP,  in the last 168 hours D-Dimer: No results found for this basename: DDIMER,  in the last 168 hours CBG:  Recent Labs Lab 01/21/14 1518 01/21/14 1827 01/21/14 2124 01/22/14 0739 01/22/14 0832  GLUCAP 106* 122* 144* 59* 149*   Hemoglobin  A1C:  Recent Labs Lab 01/21/14 1614  HGBA1C 9.6   Fasting Lipid Panel:  Recent Labs Lab 01/22/14 0601  CHOL 162  HDL 72  LDLCALC 78  TRIG 62  CHOLHDL 2.3   Thyroid Function Tests: No results found for this basename: TSH, T4TOTAL, FREET4, T3FREE, THYROIDAB,  in the last 168 hours Coagulation: No results found for this basename: LABPROT, INR,  in the last 168 hours Anemia Panel: No results found for this basename: VITAMINB12, FOLATE, FERRITIN, TIBC, IRON, RETICCTPCT,  in the last 168 hours Urine Drug Screen: Drugs of Abuse     Component Value Date/Time   LABOPIA NONE DETECTED 03/07/2013 2239   COCAINSCRNUR NONE DETECTED 03/07/2013 2239   COCAINSCRNUR NEG 09/22/2008 2125   LABBENZ NONE DETECTED 03/07/2013 2239   LABBENZ NEG 09/22/2008 2125   AMPHETMU NONE DETECTED 03/07/2013 2239   AMPHETMU NEG 09/22/2008 2125   THCU POSITIVE* 03/07/2013 2239   LABBARB NONE DETECTED 03/07/2013 2239      Micro Results: Recent Results (from the past 240 hour(s))  CULTURE, BLOOD (SINGLE)     Status: None   Collection Time    01/21/14  4:40 PM      Result Value Ref Range Status  Preliminary Report Blood Culture received; No Growth to date;   Preliminary   Preliminary Report Culture will be held for 5 days before issuing   Preliminary   Preliminary Report a Final Negative report.   Preliminary  CULTURE, BLOOD (SINGLE)     Status: None   Collection Time    01/21/14  4:48 PM      Result Value Ref Range Status   Preliminary Report Blood Culture received; No Growth to date;   Preliminary   Preliminary Report Culture will be held for 5 days before issuing   Preliminary   Preliminary Report a Final Negative report.   Preliminary   Studies/Results: Dg Knee 1-2 Views Left  01/21/2014   CLINICAL DATA:  Evaluate for osteomyelitis of leg stump.  EXAM: LEFT KNEE - 1-2 VIEW  COMPARISON:  None.  FINDINGS: Status post transtibial amputation 12/12/2013. Subcutaneous gas is present at the level of  the stump, unexpected this remote from surgery. There is mild fragmentation and irregularity of the tibial osteotomy margin, which could be residual from recent osteotomy. This finding does not allow exclusion of early osteomyelitis. The knee joint is unremarkable for age.  IMPRESSION: Subcutaneous gas in the amputation stump could be from ulceration or necrotizing infection. Minimal irregularity of the tibial osteotomy may be related to recent surgery, but cannot exclude osteomyelitis.   Electronically Signed   By: Jorje Guild M.D.   On: 01/21/2014 21:36   Medications: I have reviewed the patient's current medications. Scheduled Meds: . FLUoxetine  10 mg Oral Daily  . gabapentin  600 mg Oral TID  . heparin  5,000 Units Subcutaneous 3 times per day  . [START ON 01/23/2014] Influenza vac split quadrivalent PF  0.5 mL Intramuscular Tomorrow-1000  . insulin aspart  0-15 Units Subcutaneous TID WC  . insulin aspart  0-5 Units Subcutaneous QHS  . insulin aspart  3 Units Subcutaneous TID WC  . insulin glargine  20 Units Subcutaneous QHS  . nicotine  7 mg Transdermal Daily  . piperacillin-tazobactam (ZOSYN)  IV  3.375 g Intravenous Q8H  . [START ON 01/23/2014] pneumococcal 23 valent vaccine  0.5 mL Intramuscular Tomorrow-1000  . vancomycin  1,000 mg Intravenous Q12H   Continuous Infusions:  PRN Meds:.oxyCODONE-acetaminophen, pantoprazole Assessment/Plan: Principal Problem:   Cellulitis of leg - Patient met Sepsis criteria on admission with leukocytosis and tachycardia.  His last temp is 100 and HR 104.  He is currently receiving IV Vanc and Zosyn. - Continue broad spectrum ABx and follow blood cultures. - Consult Ortho>>Dr. Sharol Given - D/C MRI as will not likely be beneficial in setting of recent surgery  Active Problems:   Diabetes mellitus type 2, uncontrolled, with complications with Hypoglycemia - Patient is prone to hypoglycemia per previous documentation.  His AM CBG this morning was 59  despite reduction in his home insulin regimen. - Reduce lantus to 20 u daily - Reduce Mealtime to 3 u - Continue SSI-M    TOBACCO ABUSE - Patient reports he has come to the realization that he needs to quit and wants help.  Will organize for nicotine replacement therapy on discharge.    Essential hypertension -Currently normotensive    CKD Stage 2 Scr at baseline   Dispo: Disposition is deferred at this time, awaiting improvement of current medical problems.  Anticipated discharge in approximately 2 day(s).   The patient does have a current PCP [name], MD) and does need an [facility] hospital follow-up appointment after discharge.  The patient  does not have transportation limitations that hinder transportation to clinic appointments.  .Services Needed at time of discharge: Y = Yes, Blank = No PT:   OT:   RN:   Equipment:   Other:     LOS: 1 day   Wesley Groves, DO 01/22/2014, 10:53 AM

## 2014-01-22 NOTE — Consult Note (Addendum)
WOC wound consult note Reason for Consult: Consult requested for left stump.  Pt had amputation surgery by Dr Sharol Given on 9/4.  MRI has been ordered but not completed. Pt states Dr Sharol Given told him to clean affected area with soap and water and leave open to air. Wound type: Previous full thickness surgical wound now has dry intact scar tissue with few patchy areas of small scabs.  No fluctuance, open areas, or drainage at this time. Painful when area of stump near bone is touched with a swab with generalized edema surrounding site. Dressing procedure/placement/frequency: No topical treatment is needed at this time.  Please refer to Dr Sharol Given for further plan of care after MRI results are available.  Please re-consult if further assistance is needed.  Thank-you,  Julien Girt MSN, [place], [place], [place], [place]

## 2014-01-23 ENCOUNTER — Inpatient Hospital Stay (HOSPITAL_COMMUNITY): Payer: Medicaid Other | Admitting: Anesthesiology

## 2014-01-23 ENCOUNTER — Encounter (HOSPITAL_COMMUNITY)
Admission: AD | Disposition: A | Discharge status: Home Health | Payer: Self-pay | Source: Ambulatory Visit | Attending: Internal Medicine

## 2014-01-23 ENCOUNTER — Encounter (HOSPITAL_COMMUNITY): Payer: Medicaid Other | Admitting: Anesthesiology

## 2014-01-23 DIAGNOSIS — M869 Osteomyelitis, unspecified: Secondary | ICD-10-CM

## 2014-01-23 DIAGNOSIS — L02416 Cutaneous abscess of left lower limb: Secondary | ICD-10-CM

## 2014-01-23 HISTORY — PX: STUMP REVISION: SHX6102

## 2014-01-23 LAB — GLUCOSE, CAPILLARY
GLUCOSE-CAPILLARY: 211 mg/dL — AB (ref 70–99)
GLUCOSE-CAPILLARY: 176 mg/dL — AB (ref 70–99)
GLUCOSE-CAPILLARY: 135 mg/dL — AB (ref 70–99)
Glucose-Capillary: 144 mg/dL — ABNORMAL HIGH (ref 70–99)
Glucose-Capillary: 289 mg/dL — ABNORMAL HIGH (ref 70–99)

## 2014-01-23 LAB — POCT I-STAT 4, (NA,K, GLUC, HGB,HCT)
GLUCOSE: 146 mg/dL — AB (ref 70–99)
HEMATOCRIT: 29 % — AB (ref 39.0–52.0)
Hemoglobin: 9.9 g/dL — ABNORMAL LOW (ref 13.0–17.0)
POTASSIUM: 3.9 meq/L (ref 3.7–5.3)
SODIUM: 138 meq/L (ref 137–147)

## 2014-01-23 LAB — MICROALBUMIN / CREATININE URINE RATIO
Creatinine, Urine: 209.5 mg/dL
Microalb Creat Ratio: 98.8 mg/g — ABNORMAL HIGH (ref 0.0–30.0)
Microalb, Ur: 20.7 mg/dL — ABNORMAL HIGH (ref 0.00–1.89)

## 2014-01-23 SURGERY — REVISION, AMPUTATION SITE
Anesthesia: General | Site: Leg Lower | Laterality: Left

## 2014-01-23 MED ORDER — METOCLOPRAMIDE HCL 5 MG PO TABS
5.0000 mg | ORAL_TABLET | Freq: Three times a day (TID) | ORAL | Status: DC | PRN
Start: 2014-01-23 — End: 2014-01-25

## 2014-01-23 MED ORDER — OXYCODONE HCL 5 MG PO TABS
5.0000 mg | ORAL_TABLET | Freq: Once | ORAL | Status: AC | PRN
  Administered 2014-01-23: 5 mg via ORAL

## 2014-01-23 MED ORDER — HYDROMORPHONE HCL 1 MG/ML IJ SOLN
INTRAMUSCULAR | Status: AC
  Filled 2014-01-23: qty 1

## 2014-01-23 MED ORDER — METHOCARBAMOL 1000 MG/10ML IJ SOLN
500.0000 mg | Freq: Four times a day (QID) | INTRAMUSCULAR | Status: DC | PRN
  Filled 2014-01-23: qty 5

## 2014-01-23 MED ORDER — LIDOCAINE HCL (CARDIAC) 20 MG/ML IV SOLN
INTRAVENOUS | Status: DC | PRN
  Administered 2014-01-23: 80 mg via INTRAVENOUS

## 2014-01-23 MED ORDER — DOCUSATE SODIUM 100 MG PO CAPS
100.0000 mg | ORAL_CAPSULE | Freq: Two times a day (BID) | ORAL | Status: DC
  Administered 2014-01-23 – 2014-01-25 (×4): 100 mg via ORAL
  Filled 2014-01-23 (×5): qty 1

## 2014-01-23 MED ORDER — FENTANYL CITRATE 0.05 MG/ML IJ SOLN
INTRAMUSCULAR | Status: DC | PRN
  Administered 2014-01-23 (×2): 100 ug via INTRAVENOUS
  Administered 2014-01-23: 50 ug via INTRAVENOUS

## 2014-01-23 MED ORDER — SODIUM CHLORIDE 0.9 % IV SOLN: INTRAVENOUS | Status: DC

## 2014-01-23 MED ORDER — HYDROMORPHONE HCL 1 MG/ML IJ SOLN
0.5000 mg | INTRAMUSCULAR | Status: DC | PRN
  Administered 2014-01-23 – 2014-01-25 (×6): 1 mg via INTRAVENOUS
  Filled 2014-01-23 (×6): qty 1

## 2014-01-23 MED ORDER — PROPOFOL 10 MG/ML IV BOLUS
INTRAVENOUS | Status: DC | PRN
  Administered 2014-01-23: 150 mg via INTRAVENOUS
  Administered 2014-01-23: 50 mg via INTRAVENOUS

## 2014-01-23 MED ORDER — ONDANSETRON HCL 4 MG/2ML IJ SOLN: 4.0000 mg | Freq: Four times a day (QID) | INTRAMUSCULAR | Status: DC | PRN

## 2014-01-23 MED ORDER — 0.9 % SODIUM CHLORIDE (POUR BTL) OPTIME
TOPICAL | Status: DC | PRN
  Administered 2014-01-23: 1000 mL

## 2014-01-23 MED ORDER — INSULIN GLARGINE 100 UNIT/ML ~~LOC~~ SOLN
25.0000 [IU] | Freq: Every day | SUBCUTANEOUS | Status: DC
  Administered 2014-01-23: 25 [IU] via SUBCUTANEOUS
  Filled 2014-01-23 (×3): qty 0.25

## 2014-01-23 MED ORDER — CHLORHEXIDINE GLUCONATE 4 % EX LIQD
60.0000 mL | Freq: Once | CUTANEOUS | Status: AC
  Administered 2014-01-23: 4 via TOPICAL
  Filled 2014-01-23: qty 60

## 2014-01-23 MED ORDER — OXYCODONE HCL 5 MG/5ML PO SOLN
5.0000 mg | Freq: Once | ORAL | Status: AC | PRN
Start: 2014-01-23 — End: 2014-01-23

## 2014-01-23 MED ORDER — SODIUM CHLORIDE 0.9 % IV SOLN
INTRAVENOUS | Status: DC | PRN
  Administered 2014-01-23: 19:00:00 via INTRAVENOUS

## 2014-01-23 MED ORDER — PANTOPRAZOLE SODIUM 40 MG PO TBEC
40.0000 mg | DELAYED_RELEASE_TABLET | Freq: Every day | ORAL | Status: DC
  Administered 2014-01-24 – 2014-01-25 (×2): 40 mg via ORAL
  Filled 2014-01-23 (×2): qty 1

## 2014-01-23 MED ORDER — PROPOFOL 10 MG/ML IV BOLUS
INTRAVENOUS | Status: AC
  Filled 2014-01-23: qty 20

## 2014-01-23 MED ORDER — METHOCARBAMOL 500 MG PO TABS
500.0000 mg | ORAL_TABLET | Freq: Four times a day (QID) | ORAL | Status: DC | PRN
  Administered 2014-01-23 – 2014-01-25 (×3): 500 mg via ORAL
  Filled 2014-01-23 (×3): qty 1

## 2014-01-23 MED ORDER — PHENYLEPHRINE HCL 10 MG/ML IJ SOLN
INTRAMUSCULAR | Status: DC | PRN
Start: 2014-01-23 — End: 2014-01-23
  Administered 2014-01-23 (×2): 80 ug via INTRAVENOUS

## 2014-01-23 MED ORDER — HYDROMORPHONE HCL 1 MG/ML IJ SOLN
0.2500 mg | INTRAMUSCULAR | Status: DC | PRN
  Administered 2014-01-23 (×4): 0.5 mg via INTRAVENOUS

## 2014-01-23 MED ORDER — MIDAZOLAM HCL 2 MG/2ML IJ SOLN
INTRAMUSCULAR | Status: AC
  Filled 2014-01-23: qty 2

## 2014-01-23 MED ORDER — OXYCODONE HCL 5 MG PO TABS
ORAL_TABLET | ORAL | Status: AC
  Filled 2014-01-23: qty 1

## 2014-01-23 MED ORDER — ONDANSETRON HCL 4 MG PO TABS: 4.0000 mg | ORAL_TABLET | Freq: Four times a day (QID) | ORAL | Status: DC | PRN

## 2014-01-23 MED ORDER — METOCLOPRAMIDE HCL 5 MG/ML IJ SOLN: 5.0000 mg | Freq: Three times a day (TID) | INTRAMUSCULAR | Status: DC | PRN

## 2014-01-23 MED ORDER — INSULIN ASPART 100 UNIT/ML ~~LOC~~ SOLN
4.0000 [IU] | Freq: Three times a day (TID) | SUBCUTANEOUS | Status: DC
  Administered 2014-01-24 – 2014-01-25 (×5): 4 [IU] via SUBCUTANEOUS

## 2014-01-23 MED ORDER — SODIUM CHLORIDE 0.9 % IV SOLN
INTRAVENOUS | Status: DC
  Administered 2014-01-23: 18:00:00 via INTRAVENOUS

## 2014-01-23 MED ORDER — OXYCODONE-ACETAMINOPHEN 5-325 MG PO TABS
1.0000 | ORAL_TABLET | ORAL | Status: DC | PRN
  Administered 2014-01-24 – 2014-01-25 (×3): 2 via ORAL
  Filled 2014-01-23 (×3): qty 2

## 2014-01-23 MED ORDER — ONDANSETRON HCL 4 MG/2ML IJ SOLN
INTRAMUSCULAR | Status: DC | PRN
  Administered 2014-01-23: 4 mg via INTRAVENOUS

## 2014-01-23 MED ORDER — MIDAZOLAM HCL 2 MG/2ML IJ SOLN
INTRAMUSCULAR | Status: DC | PRN
  Administered 2014-01-23: 2 mg via INTRAVENOUS

## 2014-01-23 MED ORDER — FENTANYL CITRATE 0.05 MG/ML IJ SOLN
INTRAMUSCULAR | Status: AC
  Filled 2014-01-23: qty 5

## 2014-01-23 SURGICAL SUPPLY — 58 items
BANDAGE ESMARK 6X9 LF (GAUZE/BANDAGES/DRESSINGS) ×1 IMPLANT
BLADE SAW RECIP 87.9 MT (BLADE) ×4 IMPLANT
BLADE SURG 21 STRL SS (BLADE) ×2 IMPLANT
BNDG CMPR 9X6 STRL LF SNTH (GAUZE/BANDAGES/DRESSINGS) ×1
BNDG COHESIVE 6X5 TAN STRL LF (GAUZE/BANDAGES/DRESSINGS) ×4 IMPLANT
BNDG ESMARK 6X9 LF (GAUZE/BANDAGES/DRESSINGS) ×3
BNDG GAUZE STRTCH 6 (GAUZE/BANDAGES/DRESSINGS) IMPLANT
COVER SURGICAL LIGHT HANDLE (MISCELLANEOUS) ×3 IMPLANT
CUFF TOURNIQUET SINGLE 34IN LL (TOURNIQUET CUFF) IMPLANT
DRAIN PENROSE 1/2X12 LTX STRL (WOUND CARE) IMPLANT
DRAPE EXTREMITY T 121X128X90 (DRAPE) ×3 IMPLANT
DRAPE PROXIMA HALF (DRAPES) ×6 IMPLANT
DRAPE U-SHAPE 47X51 STRL (DRAPES) ×6 IMPLANT
DRSG ADAPTIC 3X8 NADH LF (GAUZE/BANDAGES/DRESSINGS) ×3 IMPLANT
DURAPREP 26ML APPLICATOR (WOUND CARE) ×3 IMPLANT
ELECT CAUTERY BLADE 6.4 (BLADE) ×2 IMPLANT
ELECT REM PT RETURN 9FT ADLT (ELECTROSURGICAL) ×3
ELECTRODE REM PT RTRN 9FT ADLT (ELECTROSURGICAL) ×1 IMPLANT
EVACUATOR 1/8 PVC DRAIN (DRAIN) IMPLANT
GAUZE SPONGE 4X4 12PLY STRL (GAUZE/BANDAGES/DRESSINGS) ×3 IMPLANT
GLOVE BIO SURGEON STRL SZ 6.5 (GLOVE) ×1 IMPLANT
GLOVE BIO SURGEONS STRL SZ 6.5 (GLOVE) ×1
GLOVE BIOGEL PI IND STRL 6.5 (GLOVE) IMPLANT
GLOVE BIOGEL PI IND STRL 9 (GLOVE) ×1 IMPLANT
GLOVE BIOGEL PI INDICATOR 6.5 (GLOVE) ×2
GLOVE BIOGEL PI INDICATOR 9 (GLOVE) ×2
GLOVE SURG ORTHO 9.0 STRL STRW (GLOVE) ×3 IMPLANT
GOWN STRL REUS W/ TWL XL LVL3 (GOWN DISPOSABLE) ×2 IMPLANT
GOWN STRL REUS W/TWL XL LVL3 (GOWN DISPOSABLE) ×6
KIT BASIN OR (CUSTOM PROCEDURE TRAY) ×3 IMPLANT
KIT ROOM TURNOVER OR (KITS) ×3 IMPLANT
MANIFOLD NEPTUNE II (INSTRUMENTS) ×3 IMPLANT
NS IRRIG 1000ML POUR BTL (IV SOLUTION) ×3 IMPLANT
PACK GENERAL/GYN (CUSTOM PROCEDURE TRAY) ×3 IMPLANT
PAD ABD 8X10 STRL (GAUZE/BANDAGES/DRESSINGS) ×2 IMPLANT
PAD ARMBOARD 7.5X6 YLW CONV (MISCELLANEOUS) ×6 IMPLANT
PAD CAST 4YDX4 CTTN HI CHSV (CAST SUPPLIES) ×1 IMPLANT
PADDING CAST ABS 6INX4YD NS (CAST SUPPLIES) ×2
PADDING CAST ABS COTTON 6X4 NS (CAST SUPPLIES) IMPLANT
PADDING CAST COTTON 4X4 STRL (CAST SUPPLIES) ×3
PADDING CAST COTTON 6X4 STRL (CAST SUPPLIES) ×3 IMPLANT
SPONGE GAUZE 4X4 12PLY STER LF (GAUZE/BANDAGES/DRESSINGS) ×2 IMPLANT
SPONGE LAP 18X18 X RAY DECT (DISPOSABLE) ×4 IMPLANT
STAPLER VISISTAT 35W (STAPLE) IMPLANT
STOCKINETTE IMPERVIOUS LG (DRAPES) ×2 IMPLANT
SUT ETHILON 2 0 PSLX (SUTURE) ×6 IMPLANT
SUT ETHILON 3 0 FSL (SUTURE) ×6 IMPLANT
SUT PDS AB 1 CT  36 (SUTURE)
SUT PDS AB 1 CT 36 (SUTURE) IMPLANT
SUT SILK 2 0 (SUTURE) ×3
SUT SILK 2-0 18XBRD TIE 12 (SUTURE) ×1 IMPLANT
SUT SILK 3 0 TIES 10X30 (SUTURE) ×2 IMPLANT
SUT VIC AB 1 CTX 36 (SUTURE)
SUT VIC AB 1 CTX36XBRD ANBCTR (SUTURE) IMPLANT
TOWEL OR 17X24 6PK STRL BLUE (TOWEL DISPOSABLE) ×3 IMPLANT
TOWEL OR 17X26 10 PK STRL BLUE (TOWEL DISPOSABLE) ×3 IMPLANT
TUBE ANAEROBIC SPECIMEN COL (MISCELLANEOUS) IMPLANT
WATER STERILE IRR 1000ML POUR (IV SOLUTION) ×3 IMPLANT

## 2014-01-23 NOTE — Transfer of Care (Signed)
Immediate Anesthesia Transfer of Care Note  Patient: [name]  Procedure(s) Performed: Procedure(s): STUMP REVISION (Left)  Patient Location: PACU  Anesthesia Type:General  Level of Consciousness: awake, alert  and oriented  Airway & Oxygen Therapy: Patient connected to nasal cannula oxygen  Post-op Assessment: Report given to PACU RN, Post -op Vital signs reviewed and stable and Patient moving all extremities X 4  Post vital signs: Reviewed and stable  Complications: No apparent anesthesia complications

## 2014-01-23 NOTE — Anesthesia Postprocedure Evaluation (Signed)
Anesthesia Post Note  Patient: [name]  Procedure(s) Performed: Procedure(s) (LRB): STUMP REVISION (Left)  Anesthesia type: General  Patient location: PACU  Post pain: Pain level controlled and Adequate analgesia  Post assessment: Post-op Vital signs reviewed, Patient's Cardiovascular Status Stable, Respiratory Function Stable, Patent Airway and Pain level controlled  Last Vitals:  Filed Vitals:   01/23/14 2115  BP: 133/84  Pulse: 102  Temp:   Resp: 12    Post vital signs: Reviewed and stable  Level of consciousness: awake, alert  and oriented  Complications: No apparent anesthesia complications

## 2014-01-23 NOTE — Progress Notes (Signed)
Attempted report x 2 to OR.

## 2014-01-23 NOTE — Progress Notes (Signed)
  I have seen and examined the patient, and reviewed the daily progress note by Jake Bathe, MS 4 and discussed the care of the patient with them. Please see my progress note from 01/23/2014 for further details regarding assessment and plan.    Signed:  Lucious Groves, DO 01/23/2014, 10:48 AM

## 2014-01-23 NOTE — Anesthesia Procedure Notes (Signed)
Procedure Name: LMA Insertion Date/Time: 01/23/2014 7:50 PM Performed by: Valetta Fuller Pre-anesthesia Checklist: Patient identified, Emergency Drugs available, Suction available and Patient being monitored Patient Re-evaluated:Patient Re-evaluated prior to inductionOxygen Delivery Method: Circle system utilized Preoxygenation: Pre-oxygenation with 100% oxygen Intubation Type: IV induction LMA: LMA inserted LMA Size: 5.0 Number of attempts: 1 Placement Confirmation: positive ETCO2 Tube secured with: Tape Dental Injury: Teeth and Oropharynx as per pre-operative assessment

## 2014-01-23 NOTE — Progress Notes (Signed)
Subjective: [name] denies fevers or chills last night. No other complaints.   Objective: Vital signs in last 24 hours: Filed Vitals:   01/22/14 1039 01/22/14 1700 01/22/14 2100 01/23/14 0500  BP: 144/79 183/94 138/76 147/70  Pulse: 104 101 96 88  Temp: 100 F (37.8 C) 98.8 F (37.1 C) 98 F (36.7 C) 97.4 F (36.3 C)  TempSrc: Oral Oral Oral Oral  Resp: 18 18 18 18   Height:      Weight:   103.8 kg (228 lb 13.4 oz)   SpO2: 100% 100% 100% 97%   Weight change: 0.2 kg (7.1 oz)  Intake/Output Summary (Last 24 hours) at 01/23/14 1031 Last data filed at 01/22/14 1944  Gross per 24 hour  Intake    970 ml  Output    800 ml  Net    170 ml   Constitutional: No acute distress, well-nourished. HENT:  Head: Normocephalic and atraumatic.  Eyes: Conjunctivae and EOM are normal. Pupils are equal, round, and reactive to light. No scleral icterus.  Cardiovascular: Regular rate and rhythm, no MRG Pulmonary/Chest: Effort normal and breath sounds normal. No wheezes or crackles. Abdominal: Soft, nontender Neurological: He is alert and oriented to person, place, and time. He exhibits normal muscle tone.  Skin: Skin is warm. L leg stump erythema and edema-improving, draining serosanguinous fluid Lab Results: Results for orders placed during the hospital encounter of 01/21/14 (from the past 24 hour(s))  MICROALBUMIN / CREATININE URINE RATIO     Status: Abnormal (Preliminary result)   Collection Time    01/22/14 10:35 AM      Result Value Ref Range   Microalb, Ur PENDING  0.00 - 1.89 mg/dL   Creatinine, Urine 209.5     Microalb Creat Ratio 98.8 (*) 0.0 - 30.0 mg/g  URINALYSIS, ROUTINE W REFLEX MICROSCOPIC     Status: Abnormal   Collection Time    01/22/14 10:35 AM      Result Value Ref Range   Color, Urine YELLOW  YELLOW   APPearance CLEAR  CLEAR   Specific Gravity, Urine 1.027  1.005 - 1.030   pH 5.5  5.0 - 8.0   Glucose, UA NEGATIVE  NEGATIVE mg/dL   Hgb urine dipstick TRACE (*)  NEGATIVE   Bilirubin Urine NEGATIVE  NEGATIVE   Ketones, ur NEGATIVE  NEGATIVE mg/dL   Protein, ur 100 (*) NEGATIVE mg/dL   Urobilinogen, UA 1.0  0.0 - 1.0 mg/dL   Nitrite NEGATIVE  NEGATIVE   Leukocytes, UA NEGATIVE  NEGATIVE  URINE RAPID DRUG SCREEN (HOSP PERFORMED)     Status: Abnormal   Collection Time    01/22/14 10:35 AM      Result Value Ref Range   Opiates NONE DETECTED  NONE DETECTED   Cocaine NONE DETECTED  NONE DETECTED   Benzodiazepines NONE DETECTED  NONE DETECTED   Amphetamines NONE DETECTED  NONE DETECTED   Tetrahydrocannabinol POSITIVE (*) NONE DETECTED   Barbiturates NONE DETECTED  NONE DETECTED  URINE MICROSCOPIC-ADD ON     Status: Abnormal   Collection Time    01/22/14 10:35 AM      Result Value Ref Range   Squamous Epithelial / LPF RARE  RARE   WBC, UA 0  <3 WBC/hpf   RBC / HPF 3 (*) <3 RBC/hpf   Bacteria, UA FEW (*) RARE   Casts HYALINE CASTS (*) NEGATIVE  GLUCOSE, CAPILLARY     Status: Abnormal   Collection Time    01/22/14 11:41 AM  Result Value Ref Range   Glucose-Capillary 245 (*) 70 - 99 mg/dL   Comment 1 Notify RN     Comment 2 Documented in Chart    GLUCOSE, CAPILLARY     Status: Abnormal   Collection Time    01/22/14  4:29 PM      Result Value Ref Range   Glucose-Capillary 180 (*) 70 - 99 mg/dL  GLUCOSE, CAPILLARY     Status: Abnormal   Collection Time    01/22/14  9:21 PM      Result Value Ref Range   Glucose-Capillary 144 (*) 70 - 99 mg/dL  GLUCOSE, CAPILLARY     Status: Abnormal   Collection Time    01/23/14  7:29 AM      Result Value Ref Range   Glucose-Capillary 289 (*) 70 - 99 mg/dL   Comment 1 Notify RN     Comment 2 Documented in Chart     Micro Results: Recent Results (from the past 240 hour(s))  CULTURE, BLOOD (SINGLE)     Status: None   Collection Time    01/21/14  4:40 PM      Result Value Ref Range Status   Preliminary Report Blood Culture received; No Growth to date;   Preliminary   Preliminary Report Culture  will be held for 5 days before issuing   Preliminary   Preliminary Report a Final Negative report.   Preliminary  CULTURE, BLOOD (SINGLE)     Status: None   Collection Time    01/21/14  4:48 PM      Result Value Ref Range Status   Preliminary Report Blood Culture received; No Growth to date;   Preliminary   Preliminary Report Culture will be held for 5 days before issuing   Preliminary   Preliminary Report a Final Negative report.   Preliminary   Studies/Results: Dg Knee 1-2 Views Left  01/21/2014   CLINICAL DATA:  Evaluate for osteomyelitis of leg stump.  EXAM: LEFT KNEE - 1-2 VIEW  COMPARISON:  None.  FINDINGS: Status post transtibial amputation 12/12/2013. Subcutaneous gas is present at the level of the stump, unexpected this remote from surgery. There is mild fragmentation and irregularity of the tibial osteotomy margin, which could be residual from recent osteotomy. This finding does not allow exclusion of early osteomyelitis. The knee joint is unremarkable for age.  IMPRESSION: Subcutaneous gas in the amputation stump could be from ulceration or necrotizing infection. Minimal irregularity of the tibial osteotomy may be related to recent surgery, but cannot exclude osteomyelitis.   Electronically Signed   By: Jorje Guild M.D.   On: 01/21/2014 21:36   Medications: I have reviewed the patient's current medications. Scheduled Meds: . chlorhexidine  60 mL Topical Once  . FLUoxetine  10 mg Oral Daily  . gabapentin  600 mg Oral TID  . heparin  5,000 Units Subcutaneous 3 times per day  . Influenza vac split quadrivalent PF  0.5 mL Intramuscular Tomorrow-1000  . insulin aspart  0-15 Units Subcutaneous TID WC  . insulin aspart  0-5 Units Subcutaneous QHS  . insulin aspart  3 Units Subcutaneous TID WC  . insulin glargine  20 Units Subcutaneous QHS  . nicotine  7 mg Transdermal Daily  . piperacillin-tazobactam (ZOSYN)  IV  3.375 g Intravenous Q8H  . pneumococcal 23 valent vaccine  0.5 mL  Intramuscular Tomorrow-1000  . vancomycin  1,000 mg Intravenous Q12H   Continuous Infusions:  PRN Meds:.oxyCODONE-acetaminophen, pantoprazole Assessment/Plan: Principal Problem:  Osteomyelitis of left leg Active Problems:   Diabetes mellitus type 2, uncontrolled, with complications   TOBACCO ABUSE   Essential hypertension   CKD Stage 2   Hypoglycemia   History of left below knee amputation  #Osteomyelitis and cellulits of left amputation stump with sepsis: Recently underwent L BKA due to osteomyelitis and ulceration of left foot.  -Dr. Sharol Given to perform revision amputation today due to high suspicion for osteomyelitis.  -Blood cultures NGTD, continue following -Wound culture ordered -Vanc/Zosyn for empiric coverage of diabetic osteomyelitis, narrow per culture results -Normal saline at 100 ml/hr.  -Percocet 5-325 mg q4h PRN.  -Carb modified diet.   #Uncontrolled DM2 with peripheral neuropathy: A1C 9.6  -Lantus 20 units QHS and Novolog 3 units AC.  -CBGs and SSI moderate ACHS.  -Continue home gabapentin 600 mg TID.   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  -Continue to monitor BMP.   #Hypertension: Has been on BP meds in past, but not currently on meds and has been normotensive. Will hold of for now due to sepsis.  -Continue to monitor.  -Will add ACE inhibitor tomorrow  #Hyperlipidemia: Not currently on statin. Total cholesterol 162, LDL 78  -Would benefit from restarting statin as he is Diabetic with LDL > 70  #GERD with ulcerative esophagitis  No reflux symptoms currently.  -Continue Protonix 40 mg daily.   #Normocytic anemia  Hgb at baseline 11.9. Last anemia panel in 2012 with normal ferritin. Likely due to chronic disease.  -Monitor CBC.   #Depression  Mood stable.  -Continue home Prozac 10 mg daily.   #Polysubstance abuse  Reports last using cocaine a year ago, alcohol one beer per week, and marijuana use  currently. UDS + for THC.  #DVT prophylaxis  -Heparin.  This is a Careers information officer Note.  The care of the patient was discussed with Dr. Heber Pine Knot and the assessment and plan formulated with their assistance.  Please see their attached note for official documentation of the daily encounter.   LOS: 2 days   Ciro Backer, Med Student 01/23/2014, 10:31 AM

## 2014-01-23 NOTE — Anesthesia Preprocedure Evaluation (Addendum)
Anesthesia Evaluation  Patient identified by MRN, date of birth, ID band Patient awake    Reviewed: Allergy & Precautions, H&P , NPO status , Patient's Chart, lab work & pertinent test results  Airway Mallampati: II  Neck ROM: full    Dental   Pulmonary Current Smoker,          Cardiovascular hypertension, + Peripheral Vascular Disease + dysrhythmias     Neuro/Psych PSYCHIATRIC DISORDERS Depression  Neuromuscular disease    GI/Hepatic PUD, GERD-  ,(+)     substance abuse  alcohol use,   Endo/Other  diabetes, Type 2obese  Renal/GU Renal InsufficiencyRenal disease     Musculoskeletal   Abdominal   Peds  Hematology   Anesthesia Other Findings   Reproductive/Obstetrics                          Anesthesia Physical Anesthesia Plan  ASA: III  Anesthesia Plan: General   Post-op Pain Management:    Induction: Intravenous  Airway Management Planned: LMA  Additional Equipment:   Intra-op Plan:   Post-operative Plan:   Informed Consent: I have reviewed the patients History and Physical, chart, labs and discussed the procedure including the risks, benefits and alternatives for the proposed anesthesia with the patient or authorized representative who has indicated his/her understanding and acceptance.     Plan Discussed with: CRNA, Anesthesiologist and Surgeon  Anesthesia Plan Comments:         Anesthesia Quick Evaluation

## 2014-01-23 NOTE — Progress Notes (Signed)
Patient is safe for discharge once cleared by PT.  Only needs 24 hours of post op antibiotics.  I'll follow up after discharge

## 2014-01-23 NOTE — Discharge Summary (Signed)
Name: [name] MRN: [id number] DOB: Sep 30, 1965 48 y.o. PCP: Wesley Avers, MD  Date of Admission: 01/21/2014  5:08 PM Date of Discharge: 01/25/2014 Attending Physician: Wesley Falcon, MD  Discharge Diagnosis: Principal Problem:   Osteomyelitis of left leg Active Problems:   Diabetes mellitus type 2, uncontrolled, with complications   TOBACCO ABUSE   Essential hypertension   CKD Stage 2   Hypoglycemia   History of left below knee amputation  Discharge Medications:   Medication List         FLUoxetine 10 MG capsule  Commonly known as:  PROZAC  Take 10 mg by mouth daily.     gabapentin 600 MG tablet  Commonly known as:  NEURONTIN  Take 600 mg by mouth 3 (three) times daily.     insulin aspart 100 UNIT/ML injection  Commonly known as:  novoLOG  Inject 5 Units into the skin 3 (three) times daily before meals.     insulin glargine 100 UNIT/ML injection  Commonly known as:  LANTUS  Inject 36 Units into the skin at bedtime.     lisinopril 10 MG tablet  Commonly known as:  PRINIVIL,ZESTRIL  Take 1 tablet (10 mg total) by mouth daily.     nicotine 14 mg/24hr patch  Commonly known as:  NICODERM CQ - dosed in mg/24 hours  Place 1 patch (14 mg total) onto the skin daily.     oxyCODONE-acetaminophen 5-325 MG per tablet  Commonly known as:  PERCOCET/ROXICET  Take 1-2 tablets by mouth every 4 (four) hours as needed for moderate pain or severe pain.     pantoprazole 40 MG tablet  Commonly known as:  PROTONIX  Take 40 mg by mouth daily as needed (for acid reflux).        Disposition and follow-up:   [name] was discharged from [facility] in Stable condition.  At the hospital follow up visit please address:  1.  Left leg osteomyelitis: Check Left BKA, monitor for signs/ symptoms of infection.  To follow up with Dr. Sharol [name] in 2 weeks.  2. HTN: Started on 10mg  of Lisinopril  3. Tobacco Abuse: Expressed desire to quit, Rx'd NRT with 14mg   Nicotine patches.  2.  Labs / imaging needed at time of follow-up: BMP  3.  Pending labs/ test needing follow-up: None  Follow-up Appointments: Follow-up Information   Follow up with Wesley V, MD In 2 weeks.   Specialty:  Orthopedic Surgery   Contact information:   [place] [address] [phone]       Follow up with [facility] In 2 weeks. (for hospital follow up)    Contact information:   [address] [place] [address] [phone]      Discharge Instructions: Discharge Instructions   Call MD for:  redness, tenderness, or signs of infection (pain, swelling, redness, odor or green/yellow discharge around incision site)    Complete by:  As directed      Call MD for:  severe uncontrolled pain    Complete by:  As directed      Call MD for:  temperature >100.4    Complete by:  As directed      Diet - low sodium heart healthy    Complete by:  As directed      Increase activity slowly    Complete by:  As directed  Consultations:    Procedures Performed:  Dg Knee 1-2 Views Left  01/21/2014   CLINICAL DATA:  Evaluate for osteomyelitis of leg stump.  EXAM: LEFT KNEE - 1-2 VIEW  COMPARISON:  None.  FINDINGS: Status post transtibial amputation 12/12/2013. Subcutaneous gas is present at the level of the stump, unexpected this remote from surgery. There is mild fragmentation and irregularity of the tibial osteotomy margin, which could be residual from recent osteotomy. This finding does not allow exclusion of early osteomyelitis. The knee joint is unremarkable for age.  IMPRESSION: Subcutaneous gas in the amputation stump could be from ulceration or necrotizing infection. Minimal irregularity of the tibial osteotomy may be related to recent surgery, but cannot exclude osteomyelitis.   Electronically Signed   By: [name] M.D.   On: 01/21/2014 21:36   Admission HPI: [name] is a 48 year old man with  history of HTN, HLD, uncontrolled DM2 with peripheral neuropathy, foot ulcers and osteomyelitis s/p bilateral BKA, CKD stage 2, chronic pyelonephritis s/p left nephrectomy, GERD with ulcerative esophagitis, depression, and polysubstance abuse presenting with pain at the site of his L BKA.   [name] presented to [facility] today for routine follow up. Since yesterday evening, he noted increased pain at the site of his L below knee amputation, which was done by Dr. Sharol [name] on 12/12/13. He says that his stump had been healing well since the operation until last night when he noticed increased swelling and pain when trying to use his shrinker. He then noted chills and rigors last night while trying to sleep and constant sharp pain with palpation of the stump that has persisted to today. The stump has been draining clear fluid since the procedure, and he hasn't noticed any change in the color, odor, or amount of discharge. He reports some associated fatigue, nausea, and decreased appetite today, but he denies any vomiting.  Hospital Course by problem list: Principal Problem:   Osteomyelitis of left leg Active Problems:   Diabetes mellitus type 2, uncontrolled, with complications   TOBACCO ABUSE   Essential hypertension   CKD Stage 2   Hypoglycemia   History of left below knee amputation   1. Osteomyelitis: Patient presented with pain, erythema, edema, and serosanginous drainage at the site of his L below the knee amputation, which was done by Dr. Sharol [name] on 12/12/13. He also complained of rigors and chills. At the time of admission, he was tachycardic with a pulse of 104 and was found to have a WBC count of 18.8. Blood cultures were ordered however patient refused collection, he was started on IV Vancomycin and Zosyn. X-[name] was performed, which showed subcutaneous gas in the amputation stump and minimal irregularity of the tibial osteotomy related to recent surgery vs. Osteomyelitis. He was seen by Dr. Sharol [name], who strongly  suspected osteomyelitis as he could probe to bone and took the patient to the OR on 10/16 for a revision BKA that removed the infected bone and abscess.  He was Continued on IV Vanc and Zosyn and was discontinued on 01/25/14 and patient was discharged home without further antibiotics  2. DM Type II w/ peripheral neuropathy: Patient's A1C at the time of admission was 9.6. At home, he takes Lantus 36 units QHS with Novolog 5 units AC. At admission, he was started on Lantus 25 units QHS and Novolog 4 units AC in addition to [place] moderate ACHS. The next morning, his glucose dropped to 59 but improved to 149 after breakfast. His  insulin was reduced to Lantus 20 units QHS and Novolog 3 units. This was gradually titrated back up to 32 units of Lantus and 4 units of mealtime novolog with a SSI-S for correction.  He was continued on his home gabapentin 600 mg TID for neuropathic pain.  He was discharged on his home insulin regimen  3. Tobacco Abuse: Patient reported 1/2 PPD smoking history, but expressed interest and readiness to quit. He was started on a nicotine patch 14mg  daily.  4. HTN: Patient had been on BP meds in past, but was not on any medication at the time of admission. Beta blockers were previously discontinued to hypoglycemic episodes. BP was moderately elevated during hospitalization with systolic pressures mostly in the 140's, and up to 180's on one occasion. He was started on 10mg  of lisinopril, he will need this followed up in clinic for further titration.  Discharge Vitals:   BP 145/74  Pulse 108  Temp(Src) 98.4 F (36.9 C) (Oral)  Resp 20  Ht 5' 8.11" (1.73 m)  Wt 216 lb 14.9 oz (98.4 kg)  BMI 32.88 kg/m2  SpO2 100%  Discharge Labs:  Results for orders placed during the hospital encounter of 01/21/14 (from the past 24 hour(s))  GLUCOSE, CAPILLARY     Status: Abnormal   Collection Time    01/24/14 11:58 AM      Result Value Ref Range   Glucose-Capillary 333 (*) 70 - 99 mg/dL    VANCOMYCIN, TROUGH     Status: None   Collection Time    01/24/14 12:08 PM      Result Value Ref Range   Vancomycin Tr 17.6  10.0 - 20.0 ug/mL  GLUCOSE, CAPILLARY     Status: Abnormal   Collection Time    01/24/14  5:06 PM      Result Value Ref Range   Glucose-Capillary 296 (*) 70 - 99 mg/dL  GLUCOSE, CAPILLARY     Status: Abnormal   Collection Time    01/24/14  9:17 PM      Result Value Ref Range   Glucose-Capillary 258 (*) 70 - 99 mg/dL  CBC     Status: Abnormal   Collection Time    01/25/14  5:30 AM      Result Value Ref Range   WBC 9.8  4.0 - 10.5 K/uL   RBC 2.88 (*) 4.22 - 5.81 MIL/uL   Hemoglobin 8.4 (*) 13.0 - 17.0 g/dL   HCT 25.3 (*) 39.0 - 52.0 %   MCV 87.8  78.0 - 100.0 fL   MCH 29.2  26.0 - 34.0 pg   MCHC 33.2  30.0 - 36.0 g/dL   RDW 12.9  11.5 - 15.5 %   Platelets 314  150 - 400 K/uL  GLUCOSE, CAPILLARY     Status: Abnormal   Collection Time    01/25/14  7:26 AM      Result Value Ref Range   Glucose-Capillary 228 (*) 70 - 99 mg/dL  BASIC METABOLIC PANEL     Status: Abnormal   Collection Time    01/25/14 10:10 AM      Result Value Ref Range   Sodium 134 (*) 137 - 147 mEq/L   Potassium 4.3  3.7 - 5.3 mEq/L   Chloride 98  96 - 112 mEq/L   CO2 23  19 - 32 mEq/L   Glucose, Bld 288 (*) 70 - 99 mg/dL   BUN 13  6 - 23 mg/dL   Creatinine, Ser 1.53 (*)  0.50 - 1.35 mg/dL   Calcium 8.7  8.4 - 10.5 mg/dL   GFR calc non Af Amer 52 (*) >90 mL/min   GFR calc Af Amer 60 (*) >90 mL/min   Anion gap 13  5 - 15  GLUCOSE, CAPILLARY     Status: Abnormal   Collection Time    01/25/14 10:37 AM      Result Value Ref Range   Glucose-Capillary 256 (*) 70 - 99 mg/dL    Signed: Lucious Groves, DO 01/25/2014, 12:23 PM   Services Ordered on Discharge: [facility] PT Equipment Ordered on Discharge: Standard Wheelchair w/ cushion, Bedside commode

## 2014-01-23 NOTE — Progress Notes (Signed)
  Date: 01/23/2014  Patient name: [name]  Medical record number: [id number]  Date of birth: 1965-04-13   This patient has been seen and the plan of care was discussed with the house staff. Please see their note for complete details. I concur with their findings with the following additions/corrections:  Plan for surgical revision of stump given likely osteomyelitis.  SW consult given Chaplain's note and concern for living situation.   Sid Falcon, MD 01/23/2014, 3:56 PM

## 2014-01-23 NOTE — Consult Note (Signed)
Reason for Consult: Abscess ulceration left transtibial amputation Referring Physician: Dr. Constance Holster is an 48 y.o. male.  HPI: Patient is a 48 year old gentleman bilateral transtibial 8 DT who presents with pain abscess swelling left transtibial amputation  Past Medical History  Diagnosis Date  . Onychomycosis   . Hypertension   . Diabetic peripheral neuropathy   . Diabetic foot ulcer     s/p Right first ray amputation, left transmetatarsal amputation with revision  . History of drug abuse     Cocaine and marijuana  . Exposure to trichomonas     treated empirically  . Cellulitis of left foot 03/2008    left 4th and 5th metatarsal area  . Hyperlipidemia   . 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 Mallory [name] tear April 2009  . History of prolonged Q-T interval on ECG   . 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  . 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 CKD (chronic kidney disease) stage 2, GFR 60-89 ml/min     BL SCr 1.3-1.4  . Depression   . Peripheral vascular disease   . Type 2 diabetes mellitus, uncontrolled, with renal complications   . GERD (gastroesophageal reflux disease)   . 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 Toe amputation  06/2011    left; 4th toe  . Nephrectomy      L side  . I&d extremity  08/24/2011    Procedure: IRRIGATION AND DEBRIDEMENT EXTREMITY;  Surgeon: Meredith Pel, MD;  Location: [place];  Service: Orthopedics;  Laterality: Left;  .  Amputation  08/24/2011    Procedure: AMPUTATION RAY;  Surgeon: Meredith Pel, MD;  Location: [place];  Service: Orthopedics;  Laterality: Left;  left fourth toe Ray Resection  . Amputation  12/05/2011    Procedure: AMPUTATION RAY;  Surgeon: Meredith Pel, MD;  Location: [place];  Service: Orthopedics;  Laterality: Left;  . I&d extremity  02/18/2012    Procedure: IRRIGATION AND DEBRIDEMENT EXTREMITY;  Surgeon: Mcarthur Rossetti, MD;  Location: [place];  Service: Orthopedics;  Laterality: Left;  . Amputation  02/18/2012    Procedure: AMPUTATION DIGIT;  Surgeon: Mcarthur Rossetti, MD;  Location: [place];  Service: Orthopedics;  Laterality: Left;  revison transmetatarsal  . I&d extremity Left 08/28/2012    Procedure: IRRIGATION AND DEBRIDEMENT EXTREMITY;  Surgeon: Linna Hoff, MD;  Location: [place];  Service: Orthopedics;  Laterality: Left;  CYSTO TUBING/IRRIGATION, LEAD HAND.  . I&d extremity Left 08/30/2012    Procedure: IRRIGATION AND DEBRIDEMENT EXTREMITY;  Surgeon: Linna Hoff, MD;  Location: [place];  Service: Orthopedics;  Laterality: Left;  . Amputation Right 09/01/2012    Procedure: FOOT 1ST RAY AMPUTATION;  Surgeon: Newt Minion, MD;  Location: [place];  Service: Orthopedics;  Laterality: Right;  . Amputation Left 09/01/2012    Procedure: FOOT REVISION TRANSMETATARSAL AMPUTATION ;  Surgeon: Newt Minion, MD;  Location: [facility]  OR;  Service: Orthopedics;  Laterality: Left;  . Amputation Right 09/13/2012    Procedure: AMPUTATION BELOW KNEE;  Surgeon: Newt Minion, MD;  Location: [place];  Service: Orthopedics;  Laterality: Right;  Right Below Knee Amputation  . Amputation Left 12/12/2013    Procedure: Left Below Knee Amputation;  Surgeon: Newt Minion, MD;  Location: [place];  Service: Orthopedics;  Laterality: Left;    Family History  Problem Relation Age of Onset  . Diabetes type II Mother   . Pancreatic cancer Father     Social History:  reports that he has been smoking Cigarettes.   He has a 17 pack-year smoking history. He has never used smokeless tobacco. He reports that he drinks alcohol. He reports that he does not use illicit drugs.  Allergies: No Known Allergies  Medications: I have reviewed the patient's current medications.  Results for orders placed during the hospital encounter of 01/21/14 (from the past 48 hour(s))  GLUCOSE, CAPILLARY     Status: Abnormal   Collection Time    01/21/14  6:27 PM      Result Value Ref Range   Glucose-Capillary 122 (*) 70 - 99 mg/dL  GLUCOSE, CAPILLARY     Status: Abnormal   Collection Time    01/21/14  9:24 PM      Result Value Ref Range   Glucose-Capillary 144 (*) 70 - 99 mg/dL  C-REACTIVE PROTEIN     Status: Abnormal   Collection Time    01/21/14 10:11 PM      Result Value Ref Range   CRP 13.5 (*) <0.60 mg/dL   Comment: Performed at [facility]     Status: None   Collection Time    01/21/14 10:11 PM      Result Value Ref Range   Procalcitonin 0.16     Comment:            Interpretation:     PCT (Procalcitonin) <= 0.5 ng/mL:     Systemic infection (sepsis) is not likely.     Local bacterial infection is possible.     (NOTE)             ICU PCT Algorithm               Non ICU PCT Algorithm        ----------------------------     ------------------------------             PCT < 0.25 ng/mL                 PCT < 0.1 ng/mL         Stopping of antibiotics            Stopping of antibiotics           strongly encouraged.               strongly encouraged.        ----------------------------     ------------------------------           PCT level decrease by               PCT < 0.25 ng/mL           >= 80% from peak PCT           OR PCT 0.25 - 0.5 ng/mL          Stopping of antibiotics  encouraged.         Stopping of antibiotics               encouraged.        ----------------------------     ------------------------------           PCT level  decrease by              PCT >= 0.25 ng/mL           < 80% from peak PCT            AND PCT >= 0.5 ng/mL            Continuing antibiotics                                                  encouraged.           Continuing antibiotics                encouraged.        ----------------------------     ------------------------------         PCT level increase compared          PCT > 0.5 ng/mL             with peak PCT AND              PCT >= 0.5 ng/mL             Escalation of antibiotics                                              strongly encouraged.          Escalation of antibiotics            strongly encouraged.  BASIC METABOLIC PANEL     Status: Abnormal   Collection Time    01/22/14  6:01 AM      Result Value Ref Range   Sodium 138  137 - 147 mEq/L   Potassium 3.7  3.7 - 5.3 mEq/L   Chloride 102  96 - 112 mEq/L   CO2 20  19 - 32 mEq/L   Glucose, Bld 84  70 - 99 mg/dL   BUN 18  6 - 23 mg/dL   Creatinine, Ser 1.66 (*) 0.50 - 1.35 mg/dL   Calcium 8.7  8.4 - 10.5 mg/dL   GFR calc non Af Amer 47 (*) >90 mL/min   GFR calc Af Amer 55 (*) >90 mL/min   Comment: (NOTE)     The eGFR has been calculated using the CKD EPI equation.     This calculation has not been validated in all clinical situations.     eGFR's persistently <90 mL/min signify possible Chronic Kidney     Disease.   Anion gap 16 (*) 5 - 15  CBC     Status: Abnormal   Collection Time    01/22/14  6:01 AM      Result Value Ref Range   WBC 17.7 (*) 4.0 - 10.5 K/uL   RBC 3.63 (*) 4.22 - 5.81 MIL/uL   Hemoglobin 10.6 (*) 13.0 - 17.0 g/dL   HCT 31.8 (*) 39.0 - 52.0 %  MCV 87.6  78.0 - 100.0 fL   MCH 29.2  26.0 - 34.0 pg   MCHC 33.3  30.0 - 36.0 g/dL   RDW 13.2  11.5 - 15.5 %   Platelets 297  150 - 400 K/uL  LIPID PANEL     Status: None   Collection Time    01/22/14  6:01 AM      Result Value Ref Range   Cholesterol 162  0 - 200 mg/dL   Triglycerides 62  <150 mg/dL   HDL 72  >39 mg/dL   Total CHOL/HDL Ratio 2.3      VLDL 12  0 - 40 mg/dL   LDL Cholesterol 78  0 - 99 mg/dL   Comment:            Total Cholesterol/HDL:CHD Risk     Coronary Heart Disease Risk Table                         Men   Women      1/2 Average Risk   3.4   3.3      Average Risk       5.0   4.4      2 X Average Risk   9.6   7.1      3 X Average Risk  23.4   11.0                Use the calculated Patient Ratio     above and the CHD Risk Table     to determine the patient's CHD Risk.                ATP III CLASSIFICATION (LDL):      <100     mg/dL   Optimal      100-129  mg/dL   Near or Above                        Optimal      130-159  mg/dL   Borderline      160-189  mg/dL   High      >190     mg/dL   Very High  HIV ANTIBODY (ROUTINE TESTING)     Status: None   Collection Time    01/22/14  6:01 AM      Result Value Ref Range   HIV 1&2 Ab, 4th Generation NONREACTIVE  NONREACTIVE   Comment: (NOTE)     A NONREACTIVE HIV Ag/Ab result does not exclude HIV infection since     the time frame for seroconversion is variable. If acute HIV infection     is suspected, a HIV-1 RNA Qualitative TMA test is recommended.     HIV-1/2 Antibody Diff         Not indicated.     HIV-1 RNA, Qual TMA           Not indicated.     PLEASE NOTE: This information has been disclosed to you from records     whose confidentiality may be protected by state law. If your state     requires such protection, then the state law prohibits you from making     any further disclosure of the information without the specific written     consent of the person to whom it pertains, or as otherwise permitted     by law. A general authorization for the release of medical or  other     information is NOT sufficient for this purpose.     The performance of this assay has not been clinically validated in     patients less than 46 years old.     Performed at [facility], CAPILL[facility]     Status: Abnormal   Collection Time    01/22/14  7:39 AM       Result Value Ref Range   Glucose-Capillary 59 (*) 70 - 99 mg/dL  GLUCOSE, CAPILLARY     Status: Abnormal   Collection Time    01/22/14  8:32 AM      Result Value Ref Range   Glucose-Capillary 149 (*) 70 - 99 mg/dL   Comment 1 Notify RN     Comment 2 Documented in Chart    MICROALBUMIN / CREATININE URINE RATIO     Status: Abnormal (Preliminary result)   Collection Time    01/22/14 10:35 AM      Result Value Ref Range   Microalb, Ur PENDING  0.00 - 1.89 mg/dL   Creatinine, Urine 209.5     Comment: No reference range established.   Microalb Creat Ratio 98.8 (*) 0.0 - 30.0 mg/g   Comment: Performed at [facility], ROUTINE W REFLEX MICROSCOPIC     Status: Abnormal   Collection Time    01/22/14 10:35 AM      Result Value Ref Range   Color, Urine YELLOW  YELLOW   APPearance CLEAR  CLEAR   Specific Gravity, Urine 1.027  1.005 - 1.030   pH 5.5  5.0 - 8.0   Glucose, UA NEGATIVE  NEGATIVE mg/dL   Hgb urine dipstick TRACE (*) NEGATIVE   Bilirubin Urine NEGATIVE  NEGATIVE   Ketones, ur NEGATIVE  NEGATIVE mg/dL   Protein, ur 100 (*) NEGATIVE mg/dL   Urobilinogen, UA 1.0  0.0 - 1.0 mg/dL   Nitrite NEGATIVE  NEGATIVE   Leukocytes, UA NEGATIVE  NEGATIVE  URINE RAPID DRUG SCREEN (HOSP PERFORMED)     Status: Abnormal   Collection Time    01/22/14 10:35 AM      Result Value Ref Range   Opiates NONE DETECTED  NONE DETECTED   Cocaine NONE DETECTED  NONE DETECTED   Benzodiazepines NONE DETECTED  NONE DETECTED   Amphetamines NONE DETECTED  NONE DETECTED   Tetrahydrocannabinol POSITIVE (*) NONE DETECTED   Barbiturates NONE DETECTED  NONE DETECTED   Comment:            DRUG SCREEN FOR MEDICAL PURPOSES     ONLY.  IF CONFIRMATION IS NEEDED     FOR ANY PURPOSE, NOTIFY LAB     WITHIN 5 DAYS.                LOWEST DETECTABLE LIMITS     FOR URINE DRUG SCREEN     Drug Class       Cutoff (ng/mL)     Amphetamine      1000     Barbiturate      200     Benzodiazepine   841      Tricyclics       660     Opiates          300     Cocaine          300     THC              50  URINE MICROSCOPIC-ADD ON  Status: Abnormal   Collection Time    01/22/14 10:35 AM      Result Value Ref Range   Squamous Epithelial / LPF RARE  RARE   WBC, UA 0  <3 WBC/hpf   Comment: 2   RBC / HPF 3 (*) <3 RBC/hpf   Comment: 6   Bacteria, UA FEW (*) RARE   Casts HYALINE CASTS (*) NEGATIVE  GLUCOSE, CAPILLARY     Status: Abnormal   Collection Time    01/22/14 11:41 AM      Result Value Ref Range   Glucose-Capillary 245 (*) 70 - 99 mg/dL   Comment 1 Notify RN     Comment 2 Documented in Chart    GLUCOSE, CAPILLARY     Status: Abnormal   Collection Time    01/22/14  4:29 PM      Result Value Ref Range   Glucose-Capillary 180 (*) 70 - 99 mg/dL  GLUCOSE, CAPILLARY     Status: Abnormal   Collection Time    01/22/14  9:21 PM      Result Value Ref Range   Glucose-Capillary 144 (*) 70 - 99 mg/dL    Dg Knee 1-2 Views Left  01/21/2014   CLINICAL DATA:  Evaluate for osteomyelitis of leg stump.  EXAM: LEFT KNEE - 1-2 VIEW  COMPARISON:  None.  FINDINGS: Status post transtibial amputation 12/12/2013. Subcutaneous gas is present at the level of the stump, unexpected this remote from surgery. There is mild fragmentation and irregularity of the tibial osteotomy margin, which could be residual from recent osteotomy. This finding does not allow exclusion of early osteomyelitis. The knee joint is unremarkable for age.  IMPRESSION: Subcutaneous gas in the amputation stump could be from ulceration or necrotizing infection. Minimal irregularity of the tibial osteotomy may be related to recent surgery, but cannot exclude osteomyelitis.   Electronically Signed   By: Jorje Guild M.D.   On: 01/21/2014 21:36    Review of Systems  All other systems reviewed and are negative.  Blood pressure 147/70, pulse 88, temperature 97.4 F (36.3 C), temperature source Oral, resp. rate 18, height 5' 8.11" (1.73  m), weight 103.8 kg (228 lb 13.4 oz), SpO2 97.00%. Physical Exam On examination patient has a purulent draining abscess left transtibial amputation. This is painful. The ulcer probes to bone. Assessment/Plan: Assessment: Abscess osteomyelitis ulceration left transtibial amputation.  Plan: Will plan for revision amputation this afternoon. Risks and benefits were discussed with the patient. Patient states he understands and wished to proceed at this time. N.p.o. at this time.  Gresham Caetano V 01/23/2014, 6:30 AM

## 2014-01-23 NOTE — Progress Notes (Signed)
Inpatient Diabetes Program Recommendations  AACE/ADA: New Consensus Statement on Inpatient Glycemic Control (2013)  Target Ranges:  Prepandial:   less than 140 mg/dL      Peak postprandial:   less than 180 mg/dL (1-2 hours)      Critically ill patients:  140 - 180 mg/dL   Reason for Visit: Hyperglycemia  Diabetes history: DM2 Outpatient Diabetes medications: Lantus 36 units QHS and Novolog 5 units tidwc Current orders for Inpatient glycemic control: Lantus 20 units QHS and Novolog moderate tidwc and hs + 3 units tidwc  Inpatient Diabetes Program Recommendations Insulin - Basal: Increase Lantus to 25 units QHS Insulin - Meal Coverage: Increase Novolog to 4 units tidwc for meal coverage insulin  Note: Pt now eating 100%. Titrate Lantus until FBS <180 mg/dL. Will continue to follow. Thank you. Lorenda Peck, RD, LDN, CDE Inpatient Diabetes Coordinator [phone]

## 2014-01-23 NOTE — Progress Notes (Signed)
Subjective: Afebrile overnight.  LLE started draining serosanguinous fluid.  Dr. Sharol Given saw this morning was able to probe to bone>>> plan for revision debridement today. Objective: Vital signs in last 24 hours: Filed Vitals:   01/22/14 1039 01/22/14 1700 01/22/14 2100 01/23/14 0500  BP: 144/79 183/94 138/76 147/70  Pulse: 104 101 96 88  Temp: 100 F (37.8 C) 98.8 F (37.1 C) 98 F (36.7 C) 97.4 F (36.3 C)  TempSrc: Oral Oral Oral Oral  Resp: _0 Height:      Weight:   228 lb 13.4 oz (103.8 kg)   SpO2: 100% 100% 100% 97%   Weight change: 7.1 oz (0.2 kg)  Intake/Output Summary (Last 24 hours) at 01/23/14 0812 Last data filed at 01/22/14 1944  Gross per 24 hour  Intake   1090 ml  Output   1100 ml  Net    -10 ml   General: resting in bed  Cardiac: RRR, no rubs, murmurs or gallops Pulm: CTAB Abd: obese, soft, nontender, nondistended, BS present Ext: left BKA swollen, tender, warm, but improving, no active drainage. Neuro: alert and oriented  Lab Results: Basic Metabolic Panel:  Recent Labs Lab 01/21/14 1640 01/22/14 0601  NA 141 138  K 4.2 3.7  CL 104 102  CO2 21 20  GLUCOSE 112* 84  BUN 14 18  CREATININE 1.51* 1.66*  CALCIUM 9.5 8.7   Liver Function Tests:  Recent Labs Lab 01/21/14 1640  AST 12  ALT 10  ALKPHOS 125*  BILITOT 0.6  PROT 8.4*  ALBUMIN 3.5   No results found for this basename: LIPASE, AMYLASE,  in the last 168 hours No results found for this basename: AMMONIA,  in the last 168 hours CBC:  Recent Labs Lab 01/21/14 1640 01/22/14 0601  WBC 18.9* 17.7*  NEUTROABS 15.9*  --   HGB 11.9* 10.6*  HCT 34.9* 31.8*  MCV 89.7 87.6  PLT 308 297   Cardiac Enzymes: No results found for this basename: CKTOTAL, CKMB, CKMBINDEX, TROPONINI,  in the last 168 hours BNP: No results found for this basename: PROBNP,  in the last 168 hours D-Dimer: No results found for this basename: DDIMER,  in the last 168 hours CBG:  Recent  Labs Lab 01/21/14 2124 01/22/14 0739 01/22/14 0832 01/22/14 1141 01/22/14 1629 01/22/14 2121  GLUCAP 144* 59* 149* 245* 180* 144*   Hemoglobin A1C:  Recent Labs Lab 01/21/14 1614  HGBA1C 9.6   Fasting Lipid Panel:  Recent Labs Lab 01/22/14 0601  CHOL 162  HDL 72  LDLCALC 78  TRIG 62  CHOLHDL 2.3   Thyroid Function Tests: No results found for this basename: TSH, T4TOTAL, FREET4, T3FREE, THYROIDAB,  in the last 168 hours Coagulation: No results found for this basename: LABPROT, INR,  in the last 168 hours Anemia Panel: No results found for this basename: VITAMINB12, FOLATE, FERRITIN, TIBC, IRON, RETICCTPCT,  in the last 168 hours Urine Drug Screen: Drugs of Abuse     Component Value Date/Time   LABOPIA NONE DETECTED 01/22/2014 [place] 01/22/2014 1035   COCAINSCRNUR NEG 09/22/2008 2125   LABBENZ NONE DETECTED 01/22/2014 1035   LABBENZ NEG 09/22/2008 2125   AMPHETMU NONE DETECTED 01/22/2014 1035   AMPHETMU NEG 09/22/2008 2125   THCU POSITIVE* 01/22/2014 1035   LABBARB NONE DETECTED 01/22/2014 1035      Micro Results: Recent Results (from the past 240 hour(s))  CULTURE, BLOOD (SINGLE)     Status:  None   Collection Time    01/21/14  4:40 PM      Result Value Ref Range Status   Preliminary Report Blood Culture received; No Growth to date;   Preliminary   Preliminary Report Culture will be held for 5 days before issuing   Preliminary   Preliminary Report a Final Negative report.   Preliminary  CULTURE, BLOOD (SINGLE)     Status: None   Collection Time    01/21/14  4:48 PM      Result Value Ref Range Status   Preliminary Report Blood Culture received; No Growth to date;   Preliminary   Preliminary Report Culture will be held for 5 days before issuing   Preliminary   Preliminary Report a Final Negative report.   Preliminary   Studies/Results: Dg Knee 1-2 Views Left  01/21/2014   CLINICAL DATA:  Evaluate for osteomyelitis of leg stump.   EXAM: LEFT KNEE - 1-2 VIEW  COMPARISON:  None.  FINDINGS: Status post transtibial amputation 12/12/2013. Subcutaneous gas is present at the level of the stump, unexpected this remote from surgery. There is mild fragmentation and irregularity of the tibial osteotomy margin, which could be residual from recent osteotomy. This finding does not allow exclusion of early osteomyelitis. The knee joint is unremarkable for age.  IMPRESSION: Subcutaneous gas in the amputation stump could be from ulceration or necrotizing infection. Minimal irregularity of the tibial osteotomy may be related to recent surgery, but cannot exclude osteomyelitis.   Electronically Signed   By: Jorje Guild M.D.   On: 01/21/2014 21:36   Medications: I have reviewed the patient's current medications. Scheduled Meds: . chlorhexidine  60 mL Topical Once  . FLUoxetine  10 mg Oral Daily  . gabapentin  600 mg Oral TID  . heparin  5,000 Units Subcutaneous 3 times per day  . Influenza vac split quadrivalent PF  0.5 mL Intramuscular Tomorrow-1000  . insulin aspart  0-15 Units Subcutaneous TID WC  . insulin aspart  0-5 Units Subcutaneous QHS  . insulin aspart  3 Units Subcutaneous TID WC  . insulin glargine  20 Units Subcutaneous QHS  . nicotine  7 mg Transdermal Daily  . piperacillin-tazobactam (ZOSYN)  IV  3.375 g Intravenous Q8H  . pneumococcal 23 valent vaccine  0.5 mL Intramuscular Tomorrow-1000  . 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  His last temp is 100 and HR 104.  He is currently receiving IV Vanc and Zosyn. - Continue broad spectrum ABx and follow blood cultures. - Consult Ortho>>Dr. Sharol Given - D/C MRI as will not likely be beneficial in setting of recent surgery  Active Problems:   Diabetes  mellitus type 2, uncontrolled, with complications - Patient is prone to hypoglycemia per previous documentation.  He was however hyperglycemic this AM -Increase  lantus to 25 u daily - Continue Mealtime 3 u - Continue SSI-M    TOBACCO ABUSE - Patient reports he has come to the realization that he needs to quit and wants help.  Will organize for nicotine replacement therapy on discharge.    Essential hypertension -Mildly hypertensive currently but with BP of 183/94 - Will start Lisinopril tomorrow after surgery.    CKD Stage 2 Scr at baseline   Dispo: Disposition is deferred at this time, awaiting improvement of current medical problems.  Anticipated discharge in approximately 2 day(s).   The patient does have a current PCP [name], MD) and does need an [facility] hospital follow-up appointment after discharge.  The patient does not have transportation limitations that hinder transportation to clinic appointments.  .Services Needed at time of discharge: Y = Yes, Blank = No PT:   OT:   RN:   Equipment:   Other:     LOS: 2 days   Lucious Groves, DO 01/23/2014, 8:12 AM

## 2014-01-23 NOTE — Op Note (Signed)
01/21/2014 - 01/23/2014  8:30 PM  PATIENT:  [name]    PRE-OPERATIVE DIAGNOSIS:  revision amputation left transtibial amputation  POST-OPERATIVE DIAGNOSIS:  Same  PROCEDURE:  Revision left transtibial amputation  SURGEON:  Newt Minion, MD  PHYSICIAN ASSISTANT:None ANESTHESIA:   General  PREOPERATIVE INDICATIONS:  [name] is a  48 y.o. male with a diagnosis of revision amputation who failed conservative measures and elected for surgical management.    The risks benefits and alternatives were discussed with the patient preoperatively including but not limited to the risks of infection, bleeding, nerve injury, cardiopulmonary complications, the need for revision surgery, among others, and the patient was willing to proceed.  OPERATIVE IMPLANTS: None  OPERATIVE FINDINGS: Calcified vessels  OPERATIVE PROCEDURE: Patient was brought to the operating room and underwent a general anesthetic. After adequate levels and anesthesia were obtained patient's left lower extremity was prepped using DuraPrep draped into a sterile field. Patient has an ulcer which extended down to bone with a deep abscess. A fishmouth incision was made to ellipse out all the ulcers of tissue including the bone. The bone of the tibia and fibula were resected in one block of tissue with the ulcerative tissue. The abscess was resected as well with this block of tissue. The muscle was cut back to healthy viable muscle. The vascular bundles were suture ligated with 3-0 silk. Hemostasis was obtained. The incision was closed using 2-0 nylon and staples. Patient was extubated taken to the PACU in stable condition.

## 2014-01-23 NOTE — Progress Notes (Signed)
Chaplain visited with pt and wife. Both are very funny and very engaging. Pt and wife interpret majority of events in their lives as God having a major hand in their life affairs.   Pt's wife inquired about getting a meal. Both are currently unemployed still awaiting disability. Pt is an amputee. Neither drives [place] and expressed that it is a 2hr walk from their home to the nearest bus stop.   Pt also disclosed that they have been without power and running water for several years, describing their living situation as "[place] house [place] the [place]". When I inquired about social work being of some assistance, they have previous experience with social work and it's results weren't palpable. Pt easily disclosed that he possesses a criminal record, drug abuse, among other issues, though I do not know the extent.   I recommend social work get involved asap to explore their situation.   Page if needed Sherrilee Gilles 01/23/2014 2:46 PM

## 2014-01-24 LAB — BASIC METABOLIC PANEL
Anion gap: 13 (ref 5–15)
BUN: 16 mg/dL (ref 6–23)
CHLORIDE: 96 meq/L (ref 96–112)
CO2: 22 mEq/L (ref 19–32)
Calcium: 8.2 mg/dL — ABNORMAL LOW (ref 8.4–10.5)
Creatinine, Ser: 1.57 mg/dL — ABNORMAL HIGH (ref 0.50–1.35)
GFR calc non Af Amer: 51 mL/min — ABNORMAL LOW (ref >90)
GFR, EST AFRICAN AMERICAN: 59 mL/min — AB (ref >90)
Glucose, Bld: 352 mg/dL — ABNORMAL HIGH (ref 70–99)
Potassium: 4.5 mEq/L (ref 3.7–5.3)
SODIUM: 131 meq/L — AB (ref 137–147)

## 2014-01-24 LAB — CBC
HEMATOCRIT: 26.5 % — AB (ref 39.0–52.0)
HEMOGLOBIN: 8.7 g/dL — AB (ref 13.0–17.0)
MCH: 29 pg (ref 26.0–34.0)
MCHC: 32.8 g/dL (ref 30.0–36.0)
MCV: 88.3 fL (ref 78.0–100.0)
Platelets: 303 10*3/uL (ref 150–400)
RBC: 3 MIL/uL — ABNORMAL LOW (ref 4.22–5.81)
RDW: 13.1 % (ref 11.5–15.5)
WBC: 13.8 10*3/uL — AB (ref 4.0–10.5)

## 2014-01-24 LAB — VANCOMYCIN, TROUGH: Vancomycin Tr: 17.6 ug/mL (ref 10.0–20.0)

## 2014-01-24 LAB — GLUCOSE, CAPILLARY
GLUCOSE-CAPILLARY: 333 mg/dL — AB (ref 70–99)
Glucose-Capillary: 293 mg/dL — ABNORMAL HIGH (ref 70–99)
Glucose-Capillary: 333 mg/dL — ABNORMAL HIGH (ref 70–99)
Glucose-Capillary: 296 mg/dL — ABNORMAL HIGH (ref 70–99)
Glucose-Capillary: 258 mg/dL — ABNORMAL HIGH (ref 70–99)

## 2014-01-24 MED ORDER — INSULIN GLARGINE 100 UNIT/ML ~~LOC~~ SOLN
32.0000 [IU] | Freq: Every day | SUBCUTANEOUS | Status: DC
  Administered 2014-01-24: 32 [IU] via SUBCUTANEOUS
  Filled 2014-01-24 (×2): qty 0.32

## 2014-01-24 MED ORDER — LISINOPRIL 10 MG PO TABS
10.0000 mg | ORAL_TABLET | Freq: Every day | ORAL | Status: DC
  Administered 2014-01-24 – 2014-01-25 (×2): 10 mg via ORAL
  Filled 2014-01-24 (×2): qty 1

## 2014-01-24 MED ORDER — POLYETHYLENE GLYCOL 3350 17 G PO PACK
17.0000 g | PACK | Freq: Once | ORAL | Status: AC
  Administered 2014-01-24: 17 g via ORAL
  Filled 2014-01-24 (×2): qty 1

## 2014-01-24 MED ORDER — INSULIN ASPART 100 UNIT/ML ~~LOC~~ SOLN
0.0000 [IU] | Freq: Every day | SUBCUTANEOUS | Status: DC
  Administered 2014-01-24: 3 [IU] via SUBCUTANEOUS

## 2014-01-24 MED ORDER — OXYCODONE-ACETAMINOPHEN 5-325 MG PO TABS: 1.0000 | ORAL_TABLET | ORAL | Status: DC | PRN

## 2014-01-24 MED ORDER — INSULIN ASPART 100 UNIT/ML ~~LOC~~ SOLN
0.0000 [IU] | Freq: Three times a day (TID) | SUBCUTANEOUS | Status: DC
  Administered 2014-01-24: 5 [IU] via SUBCUTANEOUS
  Administered 2014-01-25: 2 [IU] via SUBCUTANEOUS
  Administered 2014-01-25: 3 [IU] via SUBCUTANEOUS

## 2014-01-24 MED ORDER — NICOTINE 14 MG/24HR TD PT24
14.0000 mg | MEDICATED_PATCH | Freq: Every day | TRANSDERMAL | Status: DC
  Administered 2014-01-24 – 2014-01-25 (×2): 14 mg via TRANSDERMAL
  Filled 2014-01-24 (×2): qty 1

## 2014-01-24 NOTE — Progress Notes (Signed)
ANTIBIOTIC CONSULT NOTE - FOLLOW UP  Pharmacy Consult for Vancomycin Indication: R/O osteomyelitis  No Known Allergies  Patient Measurements: Height: 5' 8.11" (173 cm) Weight: 218 lb 0.6 oz (98.9 kg) IBW/kg (Calculated) : 68.65 Adjusted Body Weight:   Vital Signs: Temp: 97.6 F (36.4 C) (10/17 1056) Temp Source: Axillary (10/17 1056) BP: 157/85 mmHg (10/17 1056) Pulse Rate: 72 (10/17 1056) Intake/Output from previous day: 10/16 0701 - 10/17 0700 In: 2140 [P.O.:840; I.V.:500; IV Piggyback:800] Out: 1680 [Urine:1380; Blood:300] Intake/Output from this shift: Total I/O In: 360 [P.O.:360] Out: 450 [Urine:450]  Labs:  Recent Labs  01/21/14 1640 01/22/14 0601 01/22/14 1035 01/23/14 2024 01/24/14 0548  WBC 18.9* 17.7*  --   --  13.8*  HGB 11.9* 10.6*  --  9.9* 8.7*  PLT 308 297  --   --  303  LABCREA  --   --  209.5  --   --   CREATININE 1.51* 1.66*  --   --  1.57*   Estimated Creatinine Clearance: 65.8 ml/min (by C-G formula based on Cr of 1.57).  Recent Labs  01/24/14 1208  [place] 17.6     Microbiology: Recent Results (from the past 720 hour(s))  CULTURE, BLOOD (SINGLE)     Status: None   Collection Time    01/21/14  4:40 PM      Result Value Ref Range Status   Preliminary Report Blood Culture received; No Growth to date;   Preliminary   Preliminary Report Culture will be held for 5 days before issuing   Preliminary   Preliminary Report a Final Negative report.   Preliminary  CULTURE, BLOOD (SINGLE)     Status: None   Collection Time    01/21/14  4:48 PM      Result Value Ref Range Status   Preliminary Report Blood Culture received; No Growth to date;   Preliminary   Preliminary Report Culture will be held for 5 days before issuing   Preliminary   Preliminary Report a Final Negative report.   Preliminary    Anti-infectives   Start     Dose/Rate Route Frequency Ordered Stop   01/22/14 1200  vancomycin (VANCOCIN) IVPB 1000 mg/200 mL premix     1,000 mg 200 mL/hr over 60 Minutes Intravenous Every 12 hours 01/21/14 2315     01/22/14 0600  piperacillin-tazobactam (ZOSYN) IVPB 3.375 g     3.375 g 12.5 mL/hr over 240 Minutes Intravenous Every 8 hours 01/21/14 2315     01/21/14 2330  vancomycin (VANCOCIN) 2,000 mg in sodium chloride 0.9 % 500 mL IVPB     2,000 mg 250 mL/hr over 120 Minutes Intravenous  Once 01/21/14 2315 01/22/14 0232   01/21/14 2330  piperacillin-tazobactam (ZOSYN) IVPB 3.375 g     3.375 g 100 mL/hr over 30 Minutes Intravenous  Once 01/21/14 2315 01/22/14 0332      Assessment: 48yo male s/p stump revision 10/16 PM.  Vancomycin trough this AM was within goal at 17.6.  Cr 1.57- has been up/down but within this range.  Plan now is to d/c abx this PM/tomorrow AM per ortho note.  Goal of Therapy:  Vancomycin trough level 15-20 mcg/ml  Plan:  Continue currentl dosing of Vancomycin F/U d/c abx 10/18  Gracy Bruins, [facility]

## 2014-01-24 NOTE — Progress Notes (Signed)
Subjective: Patient reports surgery went well, remains afebrile.  Pain under control, fears getting addicted to percocet. Also reports he does not want SSI because he is concerned it is too much and fears it will drop his insulin, he would like to be on increased lantus. Objective: Vital signs in last 24 hours: Filed Vitals:   01/23/14 2130 01/23/14 2148 01/24/14 0531 01/24/14 1056  BP: 144/86 123/88 161/85 157/85  Pulse: 99 102 92 72  Temp: 97.8 F (36.6 C) 97.9 F (36.6 C)  97.6 F (36.4 C)  TempSrc:  Oral  Axillary  Resp: 11 12 14 15   Height:      Weight:  218 lb 0.6 oz (98.9 kg)    SpO2: 100% 100% 100% 100%   Weight change: -10 lb 12.8 oz (-4.9 kg)  Intake/Output Summary (Last 24 hours) at 01/24/14 1113 Last data filed at 01/24/14 1049  Gross per 24 hour  Intake   2500 ml  Output   1930 ml  Net    570 ml   General: resting in bed  Cardiac: RRR, no rubs, murmurs or gallops Pulm: CTAB Abd: obese, soft, nontender, nondistended, BS present Ext: left BKA bandage c/d/i  Lab Results: Basic Metabolic Panel:  Recent Labs Lab 01/22/14 0601 01/23/14 2024 01/24/14 0548  NA 138 138 131*  K 3.7 3.9 4.5  CL 102  --  96  CO2 20  --  22  GLUCOSE 84 146* 352*  BUN 18  --  16  CREATININE 1.66*  --  1.57*  CALCIUM 8.7  --  8.2*   Liver Function Tests:  Recent Labs Lab 01/21/14 1640  AST 12  ALT 10  ALKPHOS 125*  BILITOT 0.6  PROT 8.4*  ALBUMIN 3.5   No results found for this basename: LIPASE, AMYLASE,  in the last 168 hours No results found for this basename: AMMONIA,  in the last 168 hours CBC:  Recent Labs Lab 01/21/14 1640 01/22/14 0601 01/23/14 2024 01/24/14 0548  WBC 18.9* 17.7*  --  13.8*  NEUTROABS 15.9*  --   --   --   HGB 11.9* 10.6* 9.9* 8.7*  HCT 34.9* 31.8* 29.0* 26.5*  MCV 89.7 87.6  --  88.3  PLT 308 297  --  303   Cardiac Enzymes: No results found for this basename: CKTOTAL, CKMB, CKMBINDEX, TROPONINI,  in the last 168  hours BNP: No results found for this basename: PROBNP,  in the last 168 hours D-Dimer: No results found for this basename: DDIMER,  in the last 168 hours CBG:  Recent Labs Lab 01/23/14 0729 01/23/14 1232 01/23/14 1731 01/23/14 2044 01/24/14 0722 01/24/14 1054  GLUCAP 289* 211* 176* 135* 293* 333*   Hemoglobin A1C:  Recent Labs Lab 01/21/14 1614  HGBA1C 9.6   Fasting Lipid Panel:  Recent Labs Lab 01/22/14 0601  CHOL 162  HDL 72  LDLCALC 78  TRIG 62  CHOLHDL 2.3   Thyroid Function Tests: No results found for this basename: TSH, T4TOTAL, FREET4, T3FREE, THYROIDAB,  in the last 168 hours Coagulation: No results found for this basename: LABPROT, INR,  in the last 168 hours Anemia Panel: No results found for this basename: VITAMINB12, FOLATE, FERRITIN, TIBC, IRON, RETICCTPCT,  in the last 168 hours Urine Drug Screen: Drugs of Abuse     Component Value Date/Time   LABOPIA NONE DETECTED 01/22/2014 [place] 01/22/2014 1035   COCAINSCRNUR NEG 09/22/2008 2125   LABBENZ NONE DETECTED 01/22/2014  [place] 09/22/2008 2125   AMPHETMU NONE DETECTED 01/22/2014 1035   AMPHETMU NEG 09/22/2008 2125   THCU POSITIVE* 01/22/2014 1035   LABBARB NONE DETECTED 01/22/2014 1035      Micro Results: Recent Results (from the past 240 hour(s))  CULTURE, BLOOD (SINGLE)     Status: None   Collection Time    01/21/14  4:40 PM      Result Value Ref Range Status   Preliminary Report Blood Culture received; No Growth to date;   Preliminary   Preliminary Report Culture will be held for 5 days before issuing   Preliminary   Preliminary Report a Final Negative report.   Preliminary  CULTURE, BLOOD (SINGLE)     Status: None   Collection Time    01/21/14  4:48 PM      Result Value Ref Range Status   Preliminary Report Blood Culture received; No Growth to date;   Preliminary   Preliminary Report Culture will be held for 5 days before issuing   Preliminary    Preliminary Report a Final Negative report.   Preliminary   Studies/Results: No results found. Medications: I have reviewed the patient's current medications. Scheduled Meds: . docusate sodium  100 mg Oral BID  . FLUoxetine  10 mg Oral Daily  . gabapentin  600 mg Oral TID  . heparin  5,000 Units Subcutaneous 3 times per day  . insulin aspart  0-5 Units Subcutaneous QHS  . insulin aspart  0-9 Units Subcutaneous TID WC  . insulin aspart  4 Units Subcutaneous TID WC  . insulin glargine  32 Units Subcutaneous QHS  . nicotine  14 mg Transdermal Daily  . pantoprazole  40 mg Oral Daily  . piperacillin-tazobactam (ZOSYN)  IV  3.375 g Intravenous Q8H  . vancomycin  1,000 mg Intravenous Q12H   Continuous Infusions: . sodium chloride 10 mL/hr at 01/23/14 1807  . 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 Sharol Given for debridement -24 hours of post op antibiotics - PT eval -Likely D/C tomorrow  Active Problems:   Diabetes mellitus type 2, uncontrolled, with complications - Patient is prone to hypoglycemia per previous documentation.  He remains hyperglycemic -Increase  lantus to 32 u daily - Continue Mealtime 4u - Continue SSI-S (per patient preference)    TOBACCO ABUSE - Patient reports he has come to the realization that he needs to quit and wants help.  Will organize for nicotine replacement therapy on discharge.    Essential hypertension -Mildly hypertensive currently but with BP of 183/94 - Lisinopril 10mg     CKD Stage 2 Scr at baseline   Pseudohyponatremia  -Na 131, Glu 352 - Corrected Na 135  Dispo: Disposition is deferred at this time, awaiting improvement of  current medical problems.  Anticipated discharge tomorrow.  The patient does have a current PCP [name], MD) and does need an [facility] hospital follow-up appointment after discharge.  The patient does not have transportation limitations that hinder transportation to clinic appointments.  .Services Needed at time of discharge: Y = Yes, Blank = No PT:   OT:   RN:   Equipment:   Other:     LOS: 3 days   Lucious Groves, DO 01/24/2014, 11:13 AM

## 2014-01-24 NOTE — Progress Notes (Signed)
  I have seen and examined the patient, and reviewed the daily progress note by Jake Bathe, MS 4 and discussed the care of the patient with them. Please see my progress note from 01/24/2014 for further details regarding assessment and plan.    Signed:  Lucious Groves, DO 01/24/2014, 11:12 AM

## 2014-01-24 NOTE — Progress Notes (Signed)
Subjective: [name] denies fevers or chills last night. Pain is under fair control with current medications- however patient does not want to take additional percocet as he does not want his body to "get hooked on it". No other complaints. Will be unable to get prescriptions over weekend as his pharmacy is closed on weekends and cannot afford to go to a different pharmacy.  Objective: Vital signs in last 24 hours: Filed Vitals:   01/23/14 2115 01/23/14 2130 01/23/14 2148 01/24/14 0531  BP: 133/84 144/86 123/88 161/85  Pulse: 102 99 102 92  Temp:  97.8 F (36.6 C) 97.9 F (36.6 C)   TempSrc:   Oral   Resp: 12 11 12 14   Height:      Weight:   98.9 kg (218 lb 0.6 oz)   SpO2: 100% 100% 100% 100%   Weight change: -4.9 kg (-10 lb 12.8 oz)  Intake/Output Summary (Last 24 hours) at 01/24/14 0935 Last data filed at 01/24/14 0536  Gross per 24 hour  Intake   2140 ml  Output   1480 ml  Net    660 ml   Constitutional: No acute distress, well-nourished. HENT:  Head: Normocephalic and atraumatic.  Eyes: Conjunctivae and EOM are normal. Pupils are equal, round, and reactive to light. No scleral icterus.  Cardiovascular: Regular rate and rhythm, no MRG Pulmonary/Chest: Effort normal and breath sounds normal. No wheezes or crackles. Abdominal: Soft, nontender Neurological: He is alert and oriented to person, place, and time. He exhibits normal muscle tone.  Skin: Skin is warm. L leg incision c/d/i, wrapped with bandage.   Lab Results: Results for orders placed during the hospital encounter of 01/21/14 (from the past 24 hour(s))  GLUCOSE, CAPILLARY     Status: Abnormal   Collection Time    01/23/14 12:32 PM      Result Value Ref Range   Glucose-Capillary 211 (*) 70 - 99 mg/dL  GLUCOSE, CAPILLARY     Status: Abnormal   Collection Time    01/23/14  5:31 PM      Result Value Ref Range   Glucose-Capillary 176 (*) 70 - 99 mg/dL  POCT I-STAT 4, (NA,K, GLUC, HGB,HCT)     Status: Abnormal     Collection Time    01/23/14  8:24 PM      Result Value Ref Range   Sodium 138  137 - 147 mEq/L   Potassium 3.9  3.7 - 5.3 mEq/L   Glucose, Bld 146 (*) 70 - 99 mg/dL   HCT 29.0 (*) 39.0 - 52.0 %   Hemoglobin 9.9 (*) 13.0 - 17.0 g/dL  GLUCOSE, CAPILLARY     Status: Abnormal   Collection Time    01/23/14  8:44 PM      Result Value Ref Range   Glucose-Capillary 135 (*) 70 - 99 mg/dL  CBC     Status: Abnormal   Collection Time    01/24/14  5:48 AM      Result Value Ref Range   WBC 13.8 (*) 4.0 - 10.5 K/uL   RBC 3.00 (*) 4.22 - 5.81 MIL/uL   Hemoglobin 8.7 (*) 13.0 - 17.0 g/dL   HCT 26.5 (*) 39.0 - 52.0 %   MCV 88.3  78.0 - 100.0 fL   MCH 29.0  26.0 - 34.0 pg   MCHC 32.8  30.0 - 36.0 g/dL   RDW 13.1  11.5 - 15.5 %   Platelets 303  150 - 400 K/uL  BASIC METABOLIC PANEL  Status: Abnormal   Collection Time    01/24/14  5:48 AM      Result Value Ref Range   Sodium 131 (*) 137 - 147 mEq/L   Potassium 4.5  3.7 - 5.3 mEq/L   Chloride 96  96 - 112 mEq/L   CO2 22  19 - 32 mEq/L   Glucose, Bld 352 (*) 70 - 99 mg/dL   BUN 16  6 - 23 mg/dL   Creatinine, Ser 1.57 (*) 0.50 - 1.35 mg/dL   Calcium 8.2 (*) 8.4 - 10.5 mg/dL   GFR calc non Af Amer 51 (*) >90 mL/min   GFR calc Af Amer 59 (*) >90 mL/min   Anion gap 13  5 - 15  GLUCOSE, CAPILLARY     Status: Abnormal   Collection Time    01/24/14  7:22 AM      Result Value Ref Range   Glucose-Capillary 293 (*) 70 - 99 mg/dL   Micro Results: Recent Results (from the past 240 hour(s))  CULTURE, BLOOD (SINGLE)     Status: None   Collection Time    01/21/14  4:40 PM      Result Value Ref Range Status   Preliminary Report Blood Culture received; No Growth to date;   Preliminary   Preliminary Report Culture will be held for 5 days before issuing   Preliminary   Preliminary Report a Final Negative report.   Preliminary  CULTURE, BLOOD (SINGLE)     Status: None   Collection Time    01/21/14  4:48 PM      Result Value Ref Range Status    Preliminary Report Blood Culture received; No Growth to date;   Preliminary   Preliminary Report Culture will be held for 5 days before issuing   Preliminary   Preliminary Report a Final Negative report.   Preliminary   Studies/Results: No results found. Medications: I have reviewed the patient's current medications. Scheduled Meds: . docusate sodium  100 mg Oral BID  . FLUoxetine  10 mg Oral Daily  . gabapentin  600 mg Oral TID  . heparin  5,000 Units Subcutaneous 3 times per day  . Influenza vac split quadrivalent PF  0.5 mL Intramuscular Tomorrow-1000  . insulin aspart  0-15 Units Subcutaneous TID WC  . insulin aspart  0-5 Units Subcutaneous QHS  . insulin aspart  4 Units Subcutaneous TID WC  . insulin glargine  25 Units Subcutaneous QHS  . nicotine  7 mg Transdermal Daily  . pantoprazole  40 mg Oral Daily  . piperacillin-tazobactam (ZOSYN)  IV  3.375 g Intravenous Q8H  . pneumococcal 23 valent vaccine  0.5 mL Intramuscular Tomorrow-1000  . vancomycin  1,000 mg Intravenous Q12H   Continuous Infusions: . sodium chloride 10 mL/hr at 01/23/14 1807  . sodium chloride 10 mL/hr (01/23/14 2318)   PRN Meds:.HYDROmorphone (DILAUDID) injection, methocarbamol (ROBAXIN) IV, methocarbamol, metoCLOPramide (REGLAN) injection, metoCLOPramide, ondansetron (ZOFRAN) IV, ondansetron, oxyCODONE-acetaminophen Assessment/Plan: Principal Problem:   Osteomyelitis of left leg Active Problems:   Diabetes mellitus type 2, uncontrolled, with complications   TOBACCO ABUSE   Essential hypertension   CKD Stage 2   Hypoglycemia   History of left below knee amputation  #Osteomyelitis and cellulits of left amputation stump: Day 1 post-op s/p revision BKA.  -Blood cultures NGTD, Leukocytosis improving. No longer tachycardic. -Wound culture ordered but not collected. Wound appears significantly improved with minimal erythema  -Vanc/Zosyn for empiric coverage of diabetic osteomyelitis -Dilaudid 0.5-1mg  q2  PRN -Percocet  5-325 mg q4h PRN.  -Carb modified diet.   #Uncontrolled DM2 with peripheral neuropathy: A1C 9.6. -Lantus 25 units QHS and Novolog 3 units AC.  -CBGs and SSI moderate ACHS.  -Continue home gabapentin 600 mg TID.   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  -Continue to monitor BMP.   #Hypertension: Has been on BP meds in past, but not currently on meds. BP elevated in hospital w/ systolic 073-710'G -Continue to monitor.  -Will add ACE inhibitor at discharge  #Hyperlipidemia: Not currently on statin. Total cholesterol 162, LDL 78  -Would benefit from restarting statin as he is Diabetic with LDL > 70  #GERD with ulcerative esophagitis  No reflux symptoms currently.  -Continue Protonix 40 mg daily.   #Normocytic anemia  Hgb at baseline 11.9. Last anemia panel in 2012 with normal ferritin. Likely due to chronic disease.  -Monitor CBC.   #Depression  Mood stable.  -Continue home Prozac 10 mg daily.   #Polysubstance abuse  Reports last using cocaine a year ago, alcohol one beer per week, and marijuana use currently. Also smokes 1/2 ppd. UDS + for THC. -Nicotine patch 14mg   #DVT prophylaxis  -Heparin.  This is a Careers information officer Note.  The care of the patient was discussed with Dr. Heber  and the assessment and plan formulated with their assistance.  Please see their attached note for official documentation of the daily encounter.   LOS: 3 days   Ciro Backer, Med Student 01/24/2014, 9:35 AM

## 2014-01-24 NOTE — Progress Notes (Signed)
Dressing c/d/i AFVSS Pain controlled Up with PT today May dc home once he clears PT Rx in chart  N. Eduard Roux, MD [place] 9:22 AM

## 2014-01-25 DIAGNOSIS — E162 Hypoglycemia, unspecified: Secondary | ICD-10-CM

## 2014-01-25 DIAGNOSIS — Z89512 Acquired absence of left leg below knee: Secondary | ICD-10-CM

## 2014-01-25 LAB — BASIC METABOLIC PANEL
Anion gap: 13 (ref 5–15)
BUN: 13 mg/dL (ref 6–23)
CALCIUM: 8.7 mg/dL (ref 8.4–10.5)
CHLORIDE: 98 meq/L (ref 96–112)
CO2: 23 meq/L (ref 19–32)
Creatinine, Ser: 1.53 mg/dL — ABNORMAL HIGH (ref 0.50–1.35)
GFR calc Af Amer: 60 mL/min — ABNORMAL LOW (ref >90)
GFR calc non Af Amer: 52 mL/min — ABNORMAL LOW (ref >90)
Glucose, Bld: 288 mg/dL — ABNORMAL HIGH (ref 70–99)
Potassium: 4.3 mEq/L (ref 3.7–5.3)
SODIUM: 134 meq/L — AB (ref 137–147)

## 2014-01-25 LAB — CBC
HEMATOCRIT: 25.3 % — AB (ref 39.0–52.0)
HEMOGLOBIN: 8.4 g/dL — AB (ref 13.0–17.0)
MCH: 29.2 pg (ref 26.0–34.0)
MCHC: 33.2 g/dL (ref 30.0–36.0)
MCV: 87.8 fL (ref 78.0–100.0)
Platelets: 314 10*3/uL (ref 150–400)
RBC: 2.88 MIL/uL — AB (ref 4.22–5.81)
RDW: 12.9 % (ref 11.5–15.5)
WBC: 9.8 10*3/uL (ref 4.0–10.5)

## 2014-01-25 LAB — GLUCOSE, CAPILLARY
Glucose-Capillary: 228 mg/dL — ABNORMAL HIGH (ref 70–99)
Glucose-Capillary: 256 mg/dL — ABNORMAL HIGH (ref 70–99)
Glucose-Capillary: 179 mg/dL — ABNORMAL HIGH (ref 70–99)

## 2014-01-25 MED ORDER — LISINOPRIL 10 MG PO TABS
10.0000 mg | ORAL_TABLET | Freq: Every day | ORAL | Status: DC
Start: 2014-01-25 — End: 2014-03-13

## 2014-01-25 MED ORDER — NICOTINE 14 MG/24HR TD PT24: 14.0000 mg | MEDICATED_PATCH | Freq: Every day | TRANSDERMAL | Status: DC

## 2014-01-25 NOTE — Progress Notes (Signed)
Patient Discharge:  Disposition:  Home with home health PT  Education:  Educated patient on discharge instructions, follow-up appointments, and discharge medications.  Patient and wife verbalized understanding.    IV: removed by patient.  Site assessed, clean/dry/intact   Telemetry: n/a   Follow-up appointments: Instructed patient on follow-up appointments, patient and wife verbalized understanding.   Prescriptions:  Hard copy of pain medication given to patient, other prescriptions sent electronically to pharmacy  Transportation:  Social worker arranged cab ride for patient.   Belongings:

## 2014-01-25 NOTE — Evaluation (Addendum)
Physical Therapy Evaluation Patient Details Name: [name] MRN: [id number] DOB: Dec 05, 1965 Today's Date: 01/25/2014   History of Present Illness  Pt. was admitted 01/21/14 with worsening pain in left residual limb.  Had original amputation about a month ago.  Pt. failed conservative managemetn  and underwent revision of left transtibial amputation on 01/23/14.  History includes HTN, DM 2, neuropathy, chronic renal insufficiency II, left nephrectomy 2010,  Clinical Impression  Pt. Is s/p revision of left transtibial amputation and presents near his usual functional status.  Pt. Has right prosthesis but says it is ill fitting and he has not used it much lately, so has been transferring to a standard BSC without drop arms.  This greatly increased his risk for falls and I have discussed the need for drop arm 3 n 1 with patient/ wife and with RN Caryl Pina.  Pt. Also reportedly was delivered a new wheelchair one week ago from [place].  He noted that at delivery, it only had foot rests and not elevating removable leg rests which he needs.  Finally, it did not come with anti-tippers, and the rep reportedly took the anti tippers off pt's old WC and placed them on the new chair.  The anti tippers are not a proper fit for current w/c.  I have written the current equipment needs down for the RN Caryl Pina and she has contacted the case manager who will follow up.  To re-cap the pt's equipment needs:  Drop arm BSC Wheelchair with arm rests which work correctly and lock into place; removable elevating leg rests and anti tippers for his specific chair.  Please call if questions.    NO PT goals established as Therapist, sports (and pt.)  believes pt. Will DC home today.  I will sign off.    Follow Up Recommendations Home health PT;Supervision/Assistance - 24 hour    Equipment Recommendations  Other (comment);Wheelchair (measurements PT);Wheelchair cushion (measurements PT) (DROPARM 3 N 1  commode; see comments for w/c  recs)    Recommendations for Other Services       Precautions / Restrictions Precautions Precautions: Fall Precaution Comments: pt. with WC with incorrect anti-tippers and left arm rest that will not lock into place.   Restrictions Weight Bearing Restrictions: No Other Position/Activity Restrictions: bilateral BKAs      Mobility  Bed Mobility Overal bed mobility: Modified Independent             General bed mobility comments: manages up to long sitting independently with increased time and no use of bed rails  Transfers Overall transfer level: Needs assistance Equipment used:  (wheelchair) Transfers: Lateral/Scoot Transfers          Lateral/Scoot Transfers: Min guard General transfer comment: min guard assist to assure safe transfer  Ambulation/Gait Ambulation/Gait assistance:  (n/a)              Stairs            Wheelchair Mobility    Modified Rankin (Stroke Patients Only)       Balance Overall balance assessment: Modified Independent (seated at EOB and with lateral transfer to w/c)                                           Pertinent Vitals/Pain Pain Assessment: 0-10 Pain Score: 8  Pain Location: left residual limb Pain Descriptors / Indicators: Aching;Sore;Throbbing Pain Intervention(s):  Premedicated before session;Limited activity within patient's tolerance;Monitored during session    [place] expects to be discharged to:: Private residence Living Arrangements: Spouse/significant other;Other relatives (brother lives in home but not around much) Available Help at Discharge: Family;Available 24 hours/day Type of Home: House Home Access: Ramped entrance     Home Layout: One level Home Equipment: Wheelchair - [employer] - 2 wheels;Bedside commode (sliding board) Additional Comments: BSC is NOT drop arm; pt. and wife stte they have on running water or electricity for the last 7 years.  They cook by  a wood stove insert .  Pt. says [employer] delivered him a new wheelchair one week ago and the left arm rest will not lock into place for safety.  Alos he states that they did not deliver anti tippers for the new chair and only brought foot rests, not elevating leg rests.      Prior Function Level of Independence: Independent with assistive device(s)         Comments: has right prosthesis but states it is too big and needs new one     Hand Dominance   Dominant Hand: Right    Extremity/Trunk Assessment   Upper Extremity Assessment: Overall WFL for tasks assessed           Lower Extremity Assessment: Overall WFL for tasks assessed      Cervical / Trunk Assessment: Normal  Communication   Communication: No difficulties  Cognition Arousal/Alertness: Awake/alert Behavior During Therapy: WFL for tasks assessed/performed Overall Cognitive Status: Within Functional Limits for tasks assessed                      General Comments General comments (skin integrity, edema, etc.): bandage intact L LE    Exercises        Assessment/Plan    PT Assessment All further PT needs can be met in the next venue of care  PT Diagnosis Acute pain;Other (comment) (difficulty transferring)   PT Problem List Decreased balance;Decreased mobility;Decreased knowledge of use of DME;Decreased knowledge of precautions  PT Treatment Interventions     PT Goals (Current goals can be found in the Care Plan section) Acute Rehab PT Goals PT Goal Formulation: All assessment and education complete, DC therapy (DC home anticipated, no goals set)    Frequency     Barriers to discharge        Co-evaluation               End of Session Equipment Utilized During Treatment: Gait belt Activity Tolerance: Patient tolerated treatment well Patient left: in chair;Other (comment) (wheelchair) Nurse Communication: Mobility status;Other (comment) (need for home equipment)         Time:  1010-1055 PT Time Calculation (min): 45 min   Charges:   PT Evaluation $Initial PT Evaluation Tier I: 1 Procedure PT Treatments $Therapeutic Activity: 23-37 mins   PT G Codes:          [name] 01/25/2014, 11:15 AM Gerlean Ren PT Acute Rehab Services [phone] [place] [phone]

## 2014-01-25 NOTE — Care Management Note (Signed)
    Page 1 of 2   01/25/2014     2:28:50 PM CARE MANAGEMENT NOTE 01/25/2014  Patient:  [name], [name]   Account Number:  [id number]  Date Initiated:  01/25/2014  Documentation initiated by:  [facility]  Subjective/Objective Assessment:   adm:  Osteomyelitis of left leg     Action/Plan:   discharge planning   Anticipated DC Date:  01/25/2014   Anticipated DC Plan:        [place]  CM consult      [facility] Choice  HOME HEALTH   Choice offered to / List presented to:  C-1 Patient      DME agency  [place] arranged  [place].   Status of service:  Completed, signed off Medicare Important Message given?   (If response is "NO", the following Medicare IM given date fields will be blank) Date Medicare IM given:   Medicare IM given by:   Date Additional Medicare IM given:   Additional Medicare IM given by:    Discharge Disposition:  HOME/SELF CARE  Per UR Regulation:    If discussed at Long Length of Stay Meetings, dates discussed:    Comments:  01/25/14 14:20 Cm met with pt in room to discuss discharge needs.  Pt lives in a house without water, heat, or electricity.  Pt's spouse cuts wood for fuel.  Pt has a broken 3n1 at home but is not able to get it to [facility] on [address] to swap out for a new one as he has no transportation. DME rep to deliver wheelchair and cusion to room prior to discharge.   [facility] unable to accept for HHPT bc of above "unsanitary" living condition of home.  Pt and family has explained the above to me and was reiterated by [facility] rep. CM gave pt and family [employer] [facility] [place] information and spouse states she will go to [facility] in the [facility] building this week for help.  No other CM needs were communicated.  Mariane Masters, BSN, CM [phone].

## 2014-01-25 NOTE — Discharge Instructions (Signed)
Keep dressing clean dry and intact. Nonweightbearing left lower extremity.  Please take Percocet as needed for pain.  Follow up with Dr. Sharol Given as instructed (2 weeks) and in the [facility], they will call with an appointment.  Please start taking Lisinopril 10mg  a day for High blood pressure.

## 2014-01-25 NOTE — Progress Notes (Signed)
Subjective: Patient feels well, remains afebrile, wants to go home. Objective: Vital signs in last 24 hours: Filed Vitals:   01/24/14 1056 01/24/14 1743 01/24/14 2118 01/25/14 0509  BP: 157/85 149/86 162/86 161/86  Pulse: 72 102 104 106  Temp: 97.6 F (36.4 C) 99.2 F (37.3 C) 98.2 F (36.8 C) 98 F (36.7 C)  TempSrc: Axillary Oral Oral Oral  Resp: 15 15 18 18   Height:      Weight:   216 lb 14.9 oz (98.4 kg)   SpO2: 100% 100% 100% 100%   Weight change: -1 lb 1.6 oz (-0.5 kg)  Intake/Output Summary (Last 24 hours) at 01/25/14 0753 Last data filed at 01/25/14 0527  Gross per 24 hour  Intake   2230 ml  Output   2350 ml  Net   -120 ml   General: resting in bed in nAD Cardiac: RRR, no rubs, murmurs or gallops Pulm: CTAB Abd: obese, soft, nontender, nondistended, BS present Ext: left BKA bandage c/d/i  Lab Results: Basic Metabolic Panel:  Recent Labs Lab 01/22/14 0601 01/23/14 2024 01/24/14 0548  NA 138 138 131*  K 3.7 3.9 4.5  CL 102  --  96  CO2 20  --  22  GLUCOSE 84 146* 352*  BUN 18  --  16  CREATININE 1.66*  --  1.57*  CALCIUM 8.7  --  8.2*   Liver Function Tests:  Recent Labs Lab 01/21/14 1640  AST 12  ALT 10  ALKPHOS 125*  BILITOT 0.6  PROT 8.4*  ALBUMIN 3.5   No results found for this basename: LIPASE, AMYLASE,  in the last 168 hours No results found for this basename: AMMONIA,  in the last 168 hours CBC:  Recent Labs Lab 01/21/14 1640  01/24/14 0548 01/25/14 0530  WBC 18.9*  < > 13.8* 9.8  NEUTROABS 15.9*  --   --   --   HGB 11.9*  < > 8.7* 8.4*  HCT 34.9*  < > 26.5* 25.3*  MCV 89.7  < > 88.3 87.8  PLT 308  < > 303 314  < > = values in this interval not displayed. Cardiac Enzymes: No results found for this basename: CKTOTAL, CKMB, CKMBINDEX, TROPONINI,  in the last 168 hours BNP: No results found for this basename: PROBNP,  in the last 168 hours D-Dimer: No results found for this basename: DDIMER,  in the last 168  hours CBG:  Recent Labs Lab 01/24/14 0722 01/24/14 1054 01/24/14 1158 01/24/14 1706 01/24/14 2117 01/25/14 0726  GLUCAP 293* 333* 333* 296* 258* 228*   Hemoglobin A1C:  Recent Labs Lab 01/21/14 1614  HGBA1C 9.6   Fasting Lipid Panel:  Recent Labs Lab 01/22/14 0601  CHOL 162  HDL 72  LDLCALC 78  TRIG 62  CHOLHDL 2.3   Thyroid Function Tests: No results found for this basename: TSH, T4TOTAL, FREET4, T3FREE, THYROIDAB,  in the last 168 hours Coagulation: No results found for this basename: LABPROT, INR,  in the last 168 hours Anemia Panel: No results found for this basename: VITAMINB12, FOLATE, FERRITIN, TIBC, IRON, RETICCTPCT,  in the last 168 hours Urine Drug Screen: Drugs of Abuse     Component Value Date/Time   LABOPIA NONE DETECTED 01/22/2014 [place] 01/22/2014 1035   COCAINSCRNUR NEG 09/22/2008 2125   LABBENZ NONE DETECTED 01/22/2014 1035   LABBENZ NEG 09/22/2008 2125   AMPHETMU NONE DETECTED 01/22/2014 1035   AMPHETMU NEG 09/22/2008 2125   THCU  POSITIVE* 01/22/2014 1035   LABBARB NONE DETECTED 01/22/2014 1035      Micro Results: Recent Results (from the past 240 hour(s))  CULTURE, BLOOD (SINGLE)     Status: None   Collection Time    01/21/14  4:40 PM      Result Value Ref Range Status   Preliminary Report Blood Culture received; No Growth to date;   Preliminary   Preliminary Report Culture will be held for 5 days before issuing   Preliminary   Preliminary Report a Final Negative report.   Preliminary  CULTURE, BLOOD (SINGLE)     Status: None   Collection Time    01/21/14  4:48 PM      Result Value Ref Range Status   Preliminary Report Blood Culture received; No Growth to date;   Preliminary   Preliminary Report Culture will be held for 5 days before issuing   Preliminary   Preliminary Report a Final Negative report.   Preliminary   Studies/Results: No results found. Medications: I have reviewed the patient's current  medications. Scheduled Meds: . docusate sodium  100 mg Oral BID  . FLUoxetine  10 mg Oral Daily  . gabapentin  600 mg Oral TID  . heparin  5,000 Units Subcutaneous 3 times per day  . insulin aspart  0-5 Units Subcutaneous QHS  . insulin aspart  0-9 Units Subcutaneous TID WC  . insulin aspart  4 Units Subcutaneous TID WC  . insulin glargine  32 Units Subcutaneous QHS  . lisinopril  10 mg Oral Daily  . nicotine  14 mg Transdermal Daily  . pantoprazole  40 mg Oral Daily   Continuous Infusions: . sodium chloride 10 mL/hr at 01/23/14 1807  . 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 Sharol Given for debridement.  IV ABx with Vanc/Zosyn for 24 hours post op.  - D/C Abx - PT to evalaute today - Plan for discharge after PT evaluates.  Active Problems:   Diabetes mellitus type 2, uncontrolled, with complications - Patient is prone to hypoglycemia per previous documentation.  He remains hyperglycemic but improved -Lantus to 32 u daily - Continue Mealtime 4u - Continue SSI-S (per patient preference)    TOBACCO ABUSE - Patient reports he has come to the realization that he needs to quit and wants help.  Will organize for nicotine replacement therapy on discharge.    Essential hypertension -BP improved to 161/86, will need ongoing follow up of this after discharge - Lisinopril 10mg     CKD Stage 2 Scr at baseline    Dispo: Discharge home today after eval by PT.  The patient does have a current PCP [name], MD) and does need an [facility] hospital follow-up appointment after discharge.  The patient does not have transportation limitations that hinder transportation to clinic appointments.  .Services Needed  at time of discharge: Y = Yes, Blank = No PT:   OT:   RN:   Equipment:   Other:     LOS: 4 days   Lucious Groves, DO 01/25/2014, 7:53 AM

## 2014-01-25 NOTE — Clinical Social Work Note (Signed)
CSW made aware of patient's need for assistance. CSW met with patient and wife who was present at bedside. CSW verified address and contacted [facility] as patient is [place] and has wheelchair. CSW provided taxi voucher to patient's RN Benjamine Mola). No further needs. CSW signing off.  [place], [place] Weekend Clinical Social Worker [phone]

## 2014-01-27 ENCOUNTER — Encounter (HOSPITAL_COMMUNITY): Payer: Self-pay | Admitting: Orthopedic Surgery

## 2014-01-27 LAB — CULTURE, BLOOD (SINGLE)
ORGANISM ID, BACTERIA: NO GROWTH
Organism ID, Bacteria: NO GROWTH

## 2014-01-28 ENCOUNTER — Telehealth: Payer: Self-pay | Admitting: Licensed Clinical Social Worker

## 2014-01-28 NOTE — Telephone Encounter (Signed)
CSW returned call to Ms. [name].  Ms. [name] states pt was recently in surgery for stump revision.  Pt has f/u appointment with Dr. Sharol Given on 11/2 at 1:45.  Spouse requesting CSW to arrange transportation.  In addition to transportation for this appointment, spouse requesting [facility] to schedule a 2 week f/u for pt and notify of pick up time once transportation arrange.  CSW notified [facility].

## 2014-01-29 NOTE — Telephone Encounter (Signed)
11/2 pick up time 12:10 to 12:50.  Pt has scheduled [facility] appointment 10/29 at 10:15, request for transportation sent.

## 2014-01-29 NOTE — Telephone Encounter (Signed)
10/29 pick up time: Be ready 8:40 to 9:20.

## 2014-01-31 ENCOUNTER — Inpatient Hospital Stay (HOSPITAL_COMMUNITY)
Admission: EM | Admit: 2014-01-31 | Discharge: 2014-02-09 | DRG: 857 | Disposition: A | Discharge status: Home Health | Payer: Medicaid Other | Attending: Internal Medicine | Admitting: Internal Medicine

## 2014-01-31 ENCOUNTER — Emergency Department (HOSPITAL_COMMUNITY): Payer: Medicaid Other

## 2014-01-31 ENCOUNTER — Encounter (HOSPITAL_COMMUNITY): Payer: Self-pay | Admitting: Emergency Medicine

## 2014-01-31 DIAGNOSIS — IMO0001 Reserved for inherently not codable concepts without codable children: Secondary | ICD-10-CM

## 2014-01-31 DIAGNOSIS — R52 Pain, unspecified: Secondary | ICD-10-CM

## 2014-01-31 DIAGNOSIS — K210 Gastro-esophageal reflux disease with esophagitis: Secondary | ICD-10-CM | POA: Diagnosis present

## 2014-01-31 DIAGNOSIS — R509 Fever, unspecified: Secondary | ICD-10-CM

## 2014-01-31 DIAGNOSIS — E118 Type 2 diabetes mellitus with unspecified complications: Secondary | ICD-10-CM

## 2014-01-31 DIAGNOSIS — T814XXS Infection following a procedure, sequela: Secondary | ICD-10-CM

## 2014-01-31 DIAGNOSIS — D509 Iron deficiency anemia, unspecified: Secondary | ICD-10-CM | POA: Diagnosis present

## 2014-01-31 DIAGNOSIS — F17200 Nicotine dependence, unspecified, uncomplicated: Secondary | ICD-10-CM | POA: Diagnosis present

## 2014-01-31 DIAGNOSIS — IMO0002 Reserved for concepts with insufficient information to code with codable children: Secondary | ICD-10-CM | POA: Diagnosis present

## 2014-01-31 DIAGNOSIS — Z79899 Other long term (current) drug therapy: Secondary | ICD-10-CM

## 2014-01-31 DIAGNOSIS — L03116 Cellulitis of left lower limb: Secondary | ICD-10-CM | POA: Diagnosis present

## 2014-01-31 DIAGNOSIS — T8744 Infection of amputation stump, left lower extremity: Secondary | ICD-10-CM

## 2014-01-31 DIAGNOSIS — I129 Hypertensive chronic kidney disease with stage 1 through stage 4 chronic kidney disease, or unspecified chronic kidney disease: Secondary | ICD-10-CM | POA: Diagnosis present

## 2014-01-31 DIAGNOSIS — K2210 Ulcer of esophagus without bleeding: Secondary | ICD-10-CM

## 2014-01-31 DIAGNOSIS — L03119 Cellulitis of unspecified part of limb: Secondary | ICD-10-CM | POA: Diagnosis present

## 2014-01-31 DIAGNOSIS — E1165 Type 2 diabetes mellitus with hyperglycemia: Secondary | ICD-10-CM | POA: Diagnosis present

## 2014-01-31 DIAGNOSIS — F17210 Nicotine dependence, cigarettes, uncomplicated: Secondary | ICD-10-CM | POA: Diagnosis present

## 2014-01-31 DIAGNOSIS — Z79891 Long term (current) use of opiate analgesic: Secondary | ICD-10-CM

## 2014-01-31 DIAGNOSIS — Z89511 Acquired absence of right leg below knee: Secondary | ICD-10-CM

## 2014-01-31 DIAGNOSIS — L02419 Cutaneous abscess of limb, unspecified: Secondary | ICD-10-CM | POA: Diagnosis present

## 2014-01-31 DIAGNOSIS — E1142 Type 2 diabetes mellitus with diabetic polyneuropathy: Secondary | ICD-10-CM | POA: Diagnosis present

## 2014-01-31 DIAGNOSIS — Z89512 Acquired absence of left leg below knee: Secondary | ICD-10-CM

## 2014-01-31 DIAGNOSIS — E162 Hypoglycemia, unspecified: Secondary | ICD-10-CM

## 2014-01-31 DIAGNOSIS — I10 Essential (primary) hypertension: Secondary | ICD-10-CM | POA: Diagnosis present

## 2014-01-31 DIAGNOSIS — B9620 Unspecified Escherichia coli [E. coli] as the cause of diseases classified elsewhere: Secondary | ICD-10-CM | POA: Diagnosis present

## 2014-01-31 DIAGNOSIS — F329 Major depressive disorder, single episode, unspecified: Secondary | ICD-10-CM

## 2014-01-31 DIAGNOSIS — T814XXA Infection following a procedure, initial encounter: Principal | ICD-10-CM | POA: Diagnosis present

## 2014-01-31 DIAGNOSIS — N182 Chronic kidney disease, stage 2 (mild): Secondary | ICD-10-CM | POA: Diagnosis present

## 2014-01-31 DIAGNOSIS — K219 Gastro-esophageal reflux disease without esophagitis: Secondary | ICD-10-CM

## 2014-01-31 DIAGNOSIS — Z794 Long term (current) use of insulin: Secondary | ICD-10-CM

## 2014-01-31 HISTORY — DX: Cutaneous abscess of limb, unspecified: L02.419

## 2014-01-31 LAB — BASIC METABOLIC PANEL
Anion gap: 13 (ref 5–15)
BUN: 14 mg/dL (ref 6–23)
CO2: 23 mEq/L (ref 19–32)
Calcium: 9 mg/dL (ref 8.4–10.5)
Chloride: 96 mEq/L (ref 96–112)
Creatinine, Ser: 1.42 mg/dL — ABNORMAL HIGH (ref 0.50–1.35)
GFR calc Af Amer: 66 mL/min — ABNORMAL LOW (ref >90)
GFR, EST NON AFRICAN AMERICAN: 57 mL/min — AB (ref >90)
Glucose, Bld: 265 mg/dL — ABNORMAL HIGH (ref 70–99)
Potassium: 4.1 mEq/L (ref 3.7–5.3)
SODIUM: 132 meq/L — AB (ref 137–147)

## 2014-01-31 LAB — CBC WITH DIFFERENTIAL/PLATELET
BASOS ABS: 0 10*3/uL (ref 0.0–0.1)
BASOS PCT: 0 % (ref 0–1)
Eosinophils Absolute: 0.2 10*3/uL (ref 0.0–0.7)
Eosinophils Relative: 2 % (ref 0–5)
HCT: 24.5 % — ABNORMAL LOW (ref 39.0–52.0)
Hemoglobin: 8.1 g/dL — ABNORMAL LOW (ref 13.0–17.0)
LYMPHS ABS: 1.7 10*3/uL (ref 0.7–4.0)
Lymphocytes Relative: 12 % (ref 12–46)
MCH: 28.9 pg (ref 26.0–34.0)
MCHC: 33.1 g/dL (ref 30.0–36.0)
MCV: 87.5 fL (ref 78.0–100.0)
Monocytes Absolute: 0.9 10*3/uL (ref 0.1–1.0)
Monocytes Relative: 6 % (ref 3–12)
Neutro Abs: 11.2 10*3/uL — ABNORMAL HIGH (ref 1.7–7.7)
Neutrophils Relative %: 80 % — ABNORMAL HIGH (ref 43–77)
PLATELETS: 543 10*3/uL — AB (ref 150–400)
RBC: 2.8 MIL/uL — AB (ref 4.22–5.81)
RDW: 13.4 % (ref 11.5–15.5)
WBC: 14.1 10*3/uL — ABNORMAL HIGH (ref 4.0–10.5)

## 2014-01-31 LAB — URINALYSIS, ROUTINE W REFLEX MICROSCOPIC
BILIRUBIN URINE: NEGATIVE
Glucose, UA: >1000 mg/dL — AB
Ketones, ur: NEGATIVE mg/dL
LEUKOCYTES UA: NEGATIVE
NITRITE: NEGATIVE
PROTEIN: 100 mg/dL — AB
Specific Gravity, Urine: 1.027 (ref 1.005–1.030)
Urobilinogen, UA: 1 mg/dL (ref 0.0–1.0)
pH: 6 (ref 5.0–8.0)

## 2014-01-31 LAB — GLUCOSE, CAPILLARY
GLUCOSE-CAPILLARY: 194 mg/dL — AB (ref 70–99)
Glucose-Capillary: 271 mg/dL — ABNORMAL HIGH (ref 70–99)

## 2014-01-31 LAB — URINE MICROSCOPIC-ADD ON

## 2014-01-31 LAB — C-REACTIVE PROTEIN: CRP: 25.4 mg/dL — ABNORMAL HIGH (ref <0.60)

## 2014-01-31 LAB — SEDIMENTATION RATE: SED RATE: 131 mm/h — AB (ref 0–16)

## 2014-01-31 MED ORDER — SODIUM CHLORIDE 0.9 % IJ SOLN
3.0000 mL | Freq: Two times a day (BID) | INTRAMUSCULAR | Status: DC
  Administered 2014-01-31 – 2014-02-08 (×12): 3 mL via INTRAVENOUS

## 2014-01-31 MED ORDER — INSULIN GLARGINE 100 UNIT/ML ~~LOC~~ SOLN
32.0000 [IU] | Freq: Every day | SUBCUTANEOUS | Status: DC
  Administered 2014-01-31 – 2014-02-03 (×4): 32 [IU] via SUBCUTANEOUS
  Filled 2014-01-31 (×5): qty 0.32

## 2014-01-31 MED ORDER — INSULIN ASPART 100 UNIT/ML ~~LOC~~ SOLN: 3.0000 [IU] | Freq: Three times a day (TID) | SUBCUTANEOUS | Status: DC

## 2014-01-31 MED ORDER — OXYCODONE-ACETAMINOPHEN 5-325 MG PO TABS
1.0000 | ORAL_TABLET | ORAL | Status: DC | PRN
  Administered 2014-01-31 – 2014-02-09 (×20): 2 via ORAL
  Filled 2014-01-31 (×20): qty 2

## 2014-01-31 MED ORDER — VANCOMYCIN HCL IN DEXTROSE 1-5 GM/200ML-% IV SOLN
1000.0000 mg | Freq: Once | INTRAVENOUS | Status: AC
  Administered 2014-01-31: 1000 mg via INTRAVENOUS
  Filled 2014-01-31: qty 200

## 2014-01-31 MED ORDER — POLYETHYLENE GLYCOL 3350 17 G PO PACK
17.0000 g | PACK | Freq: Every day | ORAL | Status: DC
  Administered 2014-02-02 – 2014-02-09 (×4): 17 g via ORAL
  Filled 2014-01-31 (×10): qty 1

## 2014-01-31 MED ORDER — INSULIN ASPART 100 UNIT/ML ~~LOC~~ SOLN
4.0000 [IU] | Freq: Three times a day (TID) | SUBCUTANEOUS | Status: DC
  Administered 2014-02-01 – 2014-02-04 (×11): 4 [IU] via SUBCUTANEOUS

## 2014-01-31 MED ORDER — PIPERACILLIN-TAZOBACTAM 3.375 G IVPB 30 MIN
3.3750 g | Freq: Once | INTRAVENOUS | Status: AC
  Administered 2014-01-31: 3.375 g via INTRAVENOUS
  Filled 2014-01-31: qty 50

## 2014-01-31 MED ORDER — PIPERACILLIN-TAZOBACTAM 3.375 G IVPB
3.3750 g | Freq: Three times a day (TID) | INTRAVENOUS | Status: DC
  Administered 2014-01-31 – 2014-02-05 (×14): 3.375 g via INTRAVENOUS
  Filled 2014-01-31 (×17): qty 50

## 2014-01-31 MED ORDER — INSULIN ASPART 100 UNIT/ML ~~LOC~~ SOLN
0.0000 [IU] | Freq: Every day | SUBCUTANEOUS | Status: DC
  Administered 2014-02-01 – 2014-02-03 (×2): 2 [IU] via SUBCUTANEOUS
  Administered 2014-02-07: 21:00:00 via SUBCUTANEOUS

## 2014-01-31 MED ORDER — GABAPENTIN 600 MG PO TABS
600.0000 mg | ORAL_TABLET | Freq: Three times a day (TID) | ORAL | Status: DC
  Administered 2014-01-31 – 2014-02-09 (×28): 600 mg via ORAL
  Filled 2014-01-31 (×29): qty 1

## 2014-01-31 MED ORDER — PANTOPRAZOLE SODIUM 40 MG PO TBEC
40.0000 mg | DELAYED_RELEASE_TABLET | Freq: Every day | ORAL | Status: DC | PRN
  Administered 2014-02-03: 40 mg via ORAL
  Filled 2014-01-31: qty 1

## 2014-01-31 MED ORDER — PROMETHAZINE HCL 25 MG/ML IJ SOLN
12.5000 mg | Freq: Four times a day (QID) | INTRAMUSCULAR | Status: DC | PRN
  Administered 2014-01-31: 12.5 mg via INTRAVENOUS
  Filled 2014-01-31: qty 1

## 2014-01-31 MED ORDER — LISINOPRIL 10 MG PO TABS
10.0000 mg | ORAL_TABLET | Freq: Every day | ORAL | Status: DC
  Administered 2014-02-01 – 2014-02-09 (×9): 10 mg via ORAL
  Filled 2014-01-31 (×9): qty 1

## 2014-01-31 MED ORDER — INSULIN ASPART 100 UNIT/ML ~~LOC~~ SOLN
0.0000 [IU] | Freq: Three times a day (TID) | SUBCUTANEOUS | Status: DC
  Administered 2014-01-31 – 2014-02-01 (×2): 5 [IU] via SUBCUTANEOUS
  Administered 2014-02-01: 3 [IU] via SUBCUTANEOUS
  Administered 2014-02-01: 2 [IU] via SUBCUTANEOUS
  Administered 2014-02-02 (×2): 3 [IU] via SUBCUTANEOUS
  Administered 2014-02-02: 1 [IU] via SUBCUTANEOUS
  Administered 2014-02-03 (×3): 5 [IU] via SUBCUTANEOUS
  Administered 2014-02-04: 3 [IU] via SUBCUTANEOUS
  Administered 2014-02-04 – 2014-02-05 (×3): 2 [IU] via SUBCUTANEOUS
  Administered 2014-02-05: 1 [IU] via SUBCUTANEOUS
  Administered 2014-02-05 – 2014-02-06 (×2): 2 [IU] via SUBCUTANEOUS
  Administered 2014-02-06 (×2): 3 [IU] via SUBCUTANEOUS
  Administered 2014-02-07: 2 [IU] via SUBCUTANEOUS

## 2014-01-31 MED ORDER — FLUOXETINE HCL 10 MG PO CAPS
10.0000 mg | ORAL_CAPSULE | Freq: Every day | ORAL | Status: DC
  Administered 2014-01-31 – 2014-02-09 (×10): 10 mg via ORAL
  Filled 2014-01-31 (×10): qty 1

## 2014-01-31 MED ORDER — VANCOMYCIN HCL IN DEXTROSE 1-5 GM/200ML-% IV SOLN
1000.0000 mg | Freq: Two times a day (BID) | INTRAVENOUS | Status: DC
  Administered 2014-02-01 – 2014-02-04 (×7): 1000 mg via INTRAVENOUS
  Filled 2014-01-31 (×8): qty 200

## 2014-01-31 MED ORDER — ENOXAPARIN SODIUM 40 MG/0.4ML ~~LOC~~ SOLN
40.0000 mg | SUBCUTANEOUS | Status: DC
  Administered 2014-01-31 – 2014-02-09 (×11): 40 mg via SUBCUTANEOUS
  Filled 2014-01-31 (×10): qty 0.4

## 2014-01-31 MED ORDER — SENNOSIDES-DOCUSATE SODIUM 8.6-50 MG PO TABS
1.0000 | ORAL_TABLET | Freq: Two times a day (BID) | ORAL | Status: DC
  Administered 2014-01-31: 1 via ORAL
  Filled 2014-01-31 (×2): qty 1

## 2014-01-31 MED ORDER — VANCOMYCIN HCL IN DEXTROSE 750-5 MG/150ML-% IV SOLN
750.0000 mg | Freq: Once | INTRAVENOUS | Status: AC
  Administered 2014-01-31: 750 mg via INTRAVENOUS
  Filled 2014-01-31: qty 150

## 2014-01-31 NOTE — Progress Notes (Signed)
ANTIBIOTIC CONSULT NOTE - INITIAL  Pharmacy Consult for vancomycin and zosyn Indication: wound infection  No Known Allergies  Patient Measurements:   Body Weight: 98.4 kg  Vital Signs: Temp: 98.3 F (36.8 C) (10/24 1445) Temp Source: Oral (10/24 1445) BP: 155/80 mmHg (10/24 1445) Pulse Rate: 101 (10/24 1200) Intake/Output from previous day:   Intake/Output from this shift:    Labs:  Recent Labs  01/31/14 0957  WBC 14.1*  HGB 8.1*  PLT 543*  CREATININE 1.42*   The CrCl is unknown because both a height and weight (above a minimum accepted value) are required for this calculation. No results found for this basename: VANCOTROUGH, Corlis Leak, VANCORANDOM, GENTTROUGH, GENTPEAK, GENTRANDOM, TOBRATROUGH, TOBRAPEAK, TOBRARND, AMIKACINPEAK, AMIKACINTROU, AMIKACIN,  in the last 72 hours   Microbiology: Recent Results (from the past 720 hour(s))  CULTURE, BLOOD (SINGLE)     Status: None   Collection Time    01/21/14  4:40 PM      Result Value Ref Range Status   Organism ID, Bacteria NO GROWTH 5 DAYS   Final  CULTURE, BLOOD (SINGLE)     Status: None   Collection Time    01/21/14  4:48 PM      Result Value Ref Range Status   Organism ID, Bacteria NO GROWTH 5 DAYS   Final    Medical History: Past Medical History  Diagnosis Date  . Onychomycosis   . Hypertension   . Diabetic peripheral neuropathy   . Diabetic foot ulcer     s/p Right first ray amputation, left transmetatarsal amputation with revision  . History of drug abuse     Cocaine and marijuana  . Exposure to trichomonas     treated empirically  . Cellulitis of left foot 03/2008    left 4th and 5th metatarsal area  . Hyperlipidemia   . 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 Mallory [name] tear April 2009  . History of prolonged Q-T interval on ECG   . 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   . 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 CKD (chronic kidney disease) stage 2, GFR 60-89 ml/min     BL SCr 1.3-1.4  . Depression   . Peripheral vascular disease   . Type 2 diabetes mellitus, uncontrolled, with renal complications   . GERD (gastroesophageal reflux disease)   . 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 diphenhydramine-acetaminophen (TYLENOL PM) 25-500 MG TABS Take 1 tablet by mouth at bedtime as needed.      Marland Kitchen FLUoxetine (PROZAC) 10 MG capsule Take 10 mg by mouth daily.      Marland Kitchen gabapentin (NEURONTIN) 600 MG tablet Take 600 mg by mouth 3 (three) times daily.      . insulin aspart (NOVOLOG) 100 UNIT/ML injection Inject 1-10 Units into the skin 3 (three) times daily before meals.       . insulin glargine (LANTUS) 100 UNIT/ML injection Inject 32 Units into  the skin at bedtime.       Marland Kitchen lisinopril (PRINIVIL,ZESTRIL) 10 MG tablet Take 1 tablet (10 mg total) by mouth daily.  30 tablet  0  . oxyCODONE-acetaminophen (PERCOCET/ROXICET) 5-325 MG per tablet Take 1-2 tablets by mouth every 4 (four) hours as needed for moderate pain or severe pain.  30 tablet  0  . pantoprazole (PROTONIX) 40 MG tablet Take 40 mg by mouth daily as needed (for acid reflux).       Assessment: 48 yo man to start broad spectrum antibiotics for wound infection of left surgical stump, r/o sepsis.  Vancomycin 1 gm ordered in ED along with zosyn 3.375gm.  CrCl ~ 55 ml/min  Goal of Therapy:  Vancomycin trough level 15-20 mcg/ml  Plan:  Will give an additional vancomycin 750 mg for total 1750 mg then 1gm IV q12  hours Zosyn 3.375 gm IV q8 hours F/u cultures, renal function and clinical course.  Thanks for allowing pharmacy to be a part of this patient's care.  Excell Seltzer, PharmD Clinical Pharmacist, [phone] 01/31/2014,3:21 PM

## 2014-01-31 NOTE — H&P (Signed)
Date: 01/31/2014               Patient Name:  [name] MRN: [id number]  DOB: June 21, 1965 Age / Sex: 48 y.o., male   PCP: [name], MD         Medical Service: Internal Medicine Teaching Service         Attending Physician: Dr. Sid Falcon, MD    First Contact: Dr. Julious Oka Pager: [phone]  Second Contact: Dr. Joni Reining Pager: [phone]       After Hours (After 5p/  First Contact Pager: [phone]  weekends / holidays): Second Contact Pager: [phone]   Chief Complaint: lt leg pain  History of Present Illness: Pt is a 48 y/o male w/PMHx of HTN, DM, CKD2, GERD, and b/l BKA who presents w/ left leg pain. He had a lt stump revision w/ Dr. Sharol Given due to osteomyelitis on 10/16 and discharged home on 10/18 w/o abx. Pt started having fevers, NS, and chills that he attributed to constipation after his discharge on 10/18. He was able to have a bowel movement last night and sx did not get better. He then went to the ED at which time a left knee xray revealed possible soft tissue infection.   Meds: No current facility-administered medications for this encounter.   Current Outpatient Prescriptions  Medication Sig Dispense Refill  . diphenhydramine-acetaminophen (TYLENOL PM) 25-500 MG TABS Take 1 tablet by mouth at bedtime as needed.      Marland Kitchen FLUoxetine (PROZAC) 10 MG capsule Take 10 mg by mouth daily.      Marland Kitchen gabapentin (NEURONTIN) 600 MG tablet Take 600 mg by mouth 3 (three) times daily.      . insulin aspart (NOVOLOG) 100 UNIT/ML injection Inject 1-10 Units into the skin 3 (three) times daily before meals.       . insulin glargine (LANTUS) 100 UNIT/ML injection Inject 32 Units into the skin at bedtime.       Marland Kitchen lisinopril (PRINIVIL,ZESTRIL) 10 MG tablet Take 1 tablet (10 mg total) by mouth daily.  30 tablet  0  . oxyCODONE-acetaminophen (PERCOCET/ROXICET) 5-325 MG per tablet Take 1-2 tablets by mouth every 4 (four) hours as needed for moderate pain or severe pain.  30 tablet  0  .  pantoprazole (PROTONIX) 40 MG tablet Take 40 mg by mouth daily as needed (for acid reflux).        Allergies: Allergies as of 01/31/2014  . (No Known Allergies)   Past Medical History  Diagnosis Date  . Onychomycosis   . Hypertension   . Diabetic peripheral neuropathy   . Diabetic foot ulcer     s/p Right first ray amputation, left transmetatarsal amputation with revision  . History of drug abuse     Cocaine and marijuana  . Exposure to trichomonas     treated empirically  . Cellulitis of left foot 03/2008    left 4th and 5th metatarsal area  . Hyperlipidemia   . 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 Ulcerative esophagitis 2778    severe, complicated with UGI bleed  . Mallory [name] tear April 2009  . History of prolonged Q-T interval on ECG   . 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  . 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 CKD (chronic kidney disease) stage 2, GFR 60-89 ml/min  BL SCr 1.3-1.4  . Depression   . Peripheral vascular disease   . Type 2 diabetes mellitus, uncontrolled, with renal complications   . GERD (gastroesophageal reflux disease)   . 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 Toe amputation  06/2011    left; 4th toe  . Nephrectomy      L side  . I&d extremity  08/24/2011    Procedure: IRRIGATION AND DEBRIDEMENT EXTREMITY;  Surgeon: Meredith Pel, MD;  Location: [place];  Service: Orthopedics;  Laterality: Left;  . Amputation  08/24/2011    Procedure: AMPUTATION RAY;  Surgeon: Meredith Pel, MD;  Location: [place];  Service:  Orthopedics;  Laterality: Left;  left fourth toe Ray Resection  . Amputation  12/05/2011    Procedure: AMPUTATION RAY;  Surgeon: Meredith Pel, MD;  Location: [place];  Service: Orthopedics;  Laterality: Left;  . I&d extremity  02/18/2012    Procedure: IRRIGATION AND DEBRIDEMENT EXTREMITY;  Surgeon: Mcarthur Rossetti, MD;  Location: [place];  Service: Orthopedics;  Laterality: Left;  . Amputation  02/18/2012    Procedure: AMPUTATION DIGIT;  Surgeon: Mcarthur Rossetti, MD;  Location: [place];  Service: Orthopedics;  Laterality: Left;  revison transmetatarsal  . I&d extremity Left 08/28/2012    Procedure: IRRIGATION AND DEBRIDEMENT EXTREMITY;  Surgeon: Linna Hoff, MD;  Location: [place];  Service: Orthopedics;  Laterality: Left;  CYSTO TUBING/IRRIGATION, LEAD HAND.  . I&d extremity Left 08/30/2012    Procedure: IRRIGATION AND DEBRIDEMENT EXTREMITY;  Surgeon: Linna Hoff, MD;  Location: [place];  Service: Orthopedics;  Laterality: Left;  . Amputation Right 09/01/2012    Procedure: FOOT 1ST RAY AMPUTATION;  Surgeon: Newt Minion, MD;  Location: [place];  Service: Orthopedics;  Laterality: Right;  . Amputation Left 09/01/2012    Procedure: FOOT REVISION TRANSMETATARSAL AMPUTATION ;  Surgeon: Newt Minion, MD;  Location: [place];  Service: Orthopedics;  Laterality: Left;  . Amputation Right 09/13/2012    Procedure: AMPUTATION BELOW KNEE;  Surgeon: Newt Minion, MD;  Location: [place];  Service: Orthopedics;  Laterality: Right;  Right Below Knee Amputation  . Amputation Left 12/12/2013    Procedure: Left Below Knee Amputation;  Surgeon: Newt Minion, MD;  Location: [place];  Service: Orthopedics;  Laterality: Left;  . Stump revision Left 01/23/2014    Procedure: STUMP REVISION;  Surgeon: Newt Minion, MD;  Location: [place];  Service: Orthopedics;  Laterality: Left;   Family History  Problem Relation Age of Onset  . Diabetes type II Mother   . Pancreatic cancer Father    History   Social History    . Marital Status: Married    Spouse Name: N/A    Number of Children: N/A  . Years of Education: N/A   Occupational History  . Not on file.   Social History Main Topics  . Smoking status: Current Every Day Smoker -- 0.50 packs/day for 34 years    Types: Cigarettes  . Smokeless tobacco: Never Used     Comment: 1/2 PPD  . Alcohol Use: Yes     Comment: occ  . Drug Use:  No     Comment: NO LONGER USES-GRADUATING FROM GEO CRE AFTER  3-4 MONTHS                                                                                                                                       . Sexual Activity: Not on file   Other Topics Concern  . Not on file   Social History Narrative  . No narrative on file    Review of Systems: Pertinent items are noted in HPI.  Physical Exam: Blood pressure 155/80, pulse 101, temperature 98.3 F (36.8 C), temperature source Oral, resp. rate 16, SpO2 100.00%. BP 155/80  Pulse 101  Temp(Src) 98.3 F (36.8 C) (Oral)  Resp 16  SpO2 100%  General Appearance:    Alert, cooperative, no distress, appears stated age  Head:    Normocephalic, without obvious abnormality, atraumatic  Eyes:    PERRL, conjunctiva/corneas clear, EOM's intact      Lungs:     Clear to auscultation bilaterally, respirations unlabored  Heart:    Regular rate and rhythm, S1 and S2 normal, no murmur, rub   or gallop  Abdomen:     Soft, non-tender, bowel sounds active  Extremities:   B/l BKA, lt bka sutures intact, anterior aspect with some oozing btwn sutures, no odor  Neurologic:   CNII-XII grossly intact.       Lab results: Basic Metabolic Panel:  Recent Labs  01/31/14 0957  NA 132*  K 4.1  CL 96  CO2 23  GLUCOSE 265*  BUN 14  CREATININE 1.42*  CALCIUM 9.0  CBC:  Recent Labs  01/31/14 0957  WBC 14.1*  NEUTROABS 11.2*  HGB 8.1*  HCT 24.5*  MCV 87.5  PLT 543*    Imaging results:  Dg Knee 2 Views Left  01/31/2014   CLINICAL DATA:  Fever. Recent diagnosis and  treatment for osteomyelitis.  EXAM: LEFT KNEE - 1-2 VIEW  COMPARISON:  01/21/2014  FINDINGS: Since prior study, there has been additional resection of the tibial and fibular stumps. There is significant gas in the overlying soft tissues, which may all be postoperative in origin. Soft tissue infection with gas producing bacteria, however, is also possible. There are no radiopaque foreign bodies in the soft tissues.  There are no areas of bone resorption to suggest residual osteomyelitis.  IMPRESSION: No radiographic evidence of residual osteomyelitis. Significant soft tissue air in the distal amputated left leg soft tissues, deep to the surgical skin staples. This could all be postoperative in origin. Soft tissue infection is possible.   Electronically Signed   By: Lajean Manes M.D.   On: 01/31/2014 11:29    Assessment & Plan by Problem: Active Problems:   Cellulitis and abscess of leg  Cellulitis of left BKA  Patient taken to OR 01/23/14 by Dr. Sharol Given for debridement. Elevated ESR at 131 and WBCs 14.1. Pt tachycardic and  afebrile. Thus meets 2/4 of sirs criteria. Left knee xray reveals soft tissue air in the distal amputated lt leg deep to the surgical skin staples which could represent soft tissue infxn.  - Dr. Sharol Given to see patient today - IV vanc and zosyn started in the ED, will continue w/ pharm dosing - f/u blood cultures x 2 - wound culture pending - crp pending - percocet q4h prn for pain control  Diabetes mellitus type 2, uncontrolled, with complications --Patient is prone to hypoglycemia per previous documentation.  A1c 9.6 on 10/14. - Lantus 32 u daily  - Continue Mealtime 4u  - Continue SSI-S -Continue home gabapentin 600 mg TID.   Essential hypertension- currently normotensive, lisinopril was started during last admission.  - continue  Lisinopril 14m   CKD Stage 2-- Cr 1.42 improved from baseline of 1.51.   -Continue to monitor BMP.  GERD with ulcerative esophagitis - protonix  456mdaily  Depression -prozac 101mDVT prophylaxis- lovenox Diet- carb modified    Dispo: Disposition is deferred at this time, awaiting improvement of current medical problems.  The patient does have a current PCP (RiJessee AversD) and [facility].  The patient does not have transportation limitations that hinder transportation to clinic appointments.  Signed: DiaJulious OkaD 01/31/2014, 3:03 PM

## 2014-01-31 NOTE — ED Provider Notes (Signed)
CSN: [id number]     Arrival date & time 01/31/14  5093 History   First MD Initiated Contact with Patient 01/31/14 0915     Chief Complaint  Patient presents with  . Leg Pain     (Consider location/radiation/quality/duration/timing/severity/associated sxs/prior Treatment) Patient is a 48 y.o. male presenting with leg pain. The history is provided by the patient.  Leg Pain Associated symptoms: fever   Patient has had previous left below-the-knee amputation. He had a recent revision for osteomyelitis. Discharged 2 days ago. He has had increasing pain and has had fevers at home. States that it started while he was in the hospital. He states is had some constipation also, but is taken a laxative and thinks it is going to work. No abdominal pain. He states a fever just broke at home. He states he's had increasing pain in his stump.he also states his sugars have been running in the 300s.  Past Medical History  Diagnosis Date  . Onychomycosis   . Hypertension   . Diabetic peripheral neuropathy   . Diabetic foot ulcer     s/p Right first ray amputation, left transmetatarsal amputation with revision  . History of drug abuse     Cocaine and marijuana  . Exposure to trichomonas     treated empirically  . Cellulitis of left foot 03/2008    left 4th and 5th metatarsal area  . Hyperlipidemia   . Alcoholic pancreatitis 26/7124  . Ulcerative esophagitis 5809    severe, complicated with UGI bleed  . Mallory [name] tear April 2009  . History of prolonged Q-T interval on ECG   . 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  . 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 CKD (chronic kidney disease) stage 2, GFR 60-89 ml/min     BL SCr 1.3-1.4  . Depression   . Peripheral vascular disease   . Type 2 diabetes mellitus, uncontrolled, with renal complications   .  GERD (gastroesophageal reflux disease)   . 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 Toe amputation  06/2011    left; 4th toe  . Nephrectomy      L side  . I&d extremity  08/24/2011    Procedure: IRRIGATION AND DEBRIDEMENT EXTREMITY;  Surgeon: Meredith Pel, MD;  Location: [place];  Service: Orthopedics;  Laterality: Left;  . Amputation  08/24/2011    Procedure: AMPUTATION RAY;  Surgeon: Meredith Pel, MD;  Location: [place];  Service: Orthopedics;  Laterality: Left;  left fourth toe Ray Resection  . Amputation  12/05/2011    Procedure: AMPUTATION RAY;  Surgeon: Meredith Pel, MD;  Location: [place];  Service: Orthopedics;  Laterality: Left;  . I&d extremity  02/18/2012    Procedure: IRRIGATION AND DEBRIDEMENT EXTREMITY;  Surgeon: Mcarthur Rossetti, MD;  Location: [place];  Service: Orthopedics;  Laterality: Left;  . Amputation  02/18/2012    Procedure: AMPUTATION DIGIT;  Surgeon: Mcarthur Rossetti, MD;  Location: [place];  Service: Orthopedics;  Laterality: Left;  revison transmetatarsal  .  I&d extremity Left 08/28/2012    Procedure: IRRIGATION AND DEBRIDEMENT EXTREMITY;  Surgeon: Linna Hoff, MD;  Location: [place];  Service: Orthopedics;  Laterality: Left;  CYSTO TUBING/IRRIGATION, LEAD HAND.  . I&d extremity Left 08/30/2012    Procedure: IRRIGATION AND DEBRIDEMENT EXTREMITY;  Surgeon: Linna Hoff, MD;  Location: [place];  Service: Orthopedics;  Laterality: Left;  . Amputation Right 09/01/2012    Procedure: FOOT 1ST RAY AMPUTATION;  Surgeon: Newt Minion, MD;  Location: [place];  Service: Orthopedics;  Laterality: Right;  . Amputation Left 09/01/2012    Procedure: FOOT REVISION TRANSMETATARSAL  AMPUTATION ;  Surgeon: Newt Minion, MD;  Location: [place];  Service: Orthopedics;  Laterality: Left;  . Amputation Right 09/13/2012    Procedure: AMPUTATION BELOW KNEE;  Surgeon: Newt Minion, MD;  Location: [place];  Service: Orthopedics;  Laterality: Right;  Right Below Knee Amputation  . Amputation Left 12/12/2013    Procedure: Left Below Knee Amputation;  Surgeon: Newt Minion, MD;  Location: [place];  Service: Orthopedics;  Laterality: Left;  . Stump revision Left 01/23/2014    Procedure: STUMP REVISION;  Surgeon: Newt Minion, MD;  Location: [place];  Service: Orthopedics;  Laterality: Left;   Family History  Problem Relation Age of Onset  . Diabetes type II Mother   . Pancreatic cancer Father    History  Substance Use Topics  . Smoking status: Current Every Day Smoker -- 0.50 packs/day for 34 years    Types: Cigarettes  . Smokeless tobacco: Never Used     Comment: 1/2 PPD  . Alcohol Use: Yes     Comment: occ    Review of Systems  Constitutional: Positive for fever. Negative for appetite change.  Respiratory: Negative for cough.   Cardiovascular: Negative for chest pain.  Gastrointestinal: Positive for constipation. Negative for abdominal pain.  Genitourinary: Negative for flank pain.  Skin: Positive for wound.  Neurological: Negative for seizures.  Psychiatric/Behavioral: Negative for confusion.      Allergies  Review of patient's allergies indicates no known allergies.  Home Medications   Prior to Admission medications   Medication Sig Start Date End Date Taking? Authorizing Provider  diphenhydramine-acetaminophen (TYLENOL PM) 25-500 MG TABS Take 1 tablet by mouth at bedtime as needed.   Yes Historical Provider, MD  FLUoxetine (PROZAC) 10 MG capsule Take 10 mg by mouth daily.   Yes Historical Provider, MD  gabapentin (NEURONTIN) 600 MG tablet Take 600 mg by mouth 3 (three) times daily.   Yes Historical Provider, MD  insulin aspart (NOVOLOG) 100 UNIT/ML injection Inject  1-10 Units into the skin 3 (three) times daily before meals.    Yes Historical Provider, MD  insulin glargine (LANTUS) 100 UNIT/ML injection Inject 32 Units into the skin at bedtime.    Yes Historical Provider, MD  lisinopril (PRINIVIL,ZESTRIL) 10 MG tablet Take 1 tablet (10 mg total) by mouth daily. 01/25/14  Yes Lucious Groves, DO  oxyCODONE-acetaminophen (PERCOCET/ROXICET) 5-325 MG per tablet Take 1-2 tablets by mouth every 4 (four) hours as needed for moderate pain or severe pain. 01/24/14  Yes Naiping Eduard Roux, MD  pantoprazole (PROTONIX) 40 MG tablet Take 40 mg by mouth daily as needed (for acid reflux).   Yes Historical Provider, MD   BP 154/91  Pulse 104  Temp(Src) 98.7 F (37.1 C) (Oral)  Resp 18  SpO2 100% Physical Exam  Constitutional: He appears well-developed.  HENT:  Head: Normocephalic.  Eyes: Pupils are  equal, round, and reactive to light.  Cardiovascular: Normal rate.   Pulmonary/Chest: Effort normal.  Abdominal: There is no tenderness.  Musculoskeletal:  Bilateral below-the-knee amputation. Dressings removed from the left lower extremity. There is some purulent drainage anteriorly on the wound. There is tenderness anteriorly. Knee is nontender.  Neurological: He is alert.  Skin: Skin is warm.    ED Course  Procedures (including critical care time) Labs Review Labs Reviewed  CBC WITH DIFFERENTIAL - Abnormal; Notable for the following:    WBC 14.1 (*)    RBC 2.80 (*)    Hemoglobin 8.1 (*)    HCT 24.5 (*)    Platelets 543 (*)    Neutrophils Relative % 80 (*)    Neutro Abs 11.2 (*)    All other components within normal limits  SEDIMENTATION RATE - Abnormal; Notable for the following:    Sed Rate 131 (*)    All other components within normal limits  BASIC METABOLIC PANEL - Abnormal; Notable for the following:    Sodium 132 (*)    Glucose, Bld 265 (*)    Creatinine, Ser 1.42 (*)    GFR calc non Af Amer 57 (*)    GFR calc Af Amer 66 (*)    All other  components within normal limits  URINALYSIS, ROUTINE W REFLEX MICROSCOPIC - Abnormal; Notable for the following:    Glucose, UA >1000 (*)    Hgb urine dipstick TRACE (*)    Protein, ur 100 (*)    All other components within normal limits  URINE MICROSCOPIC-ADD ON - Abnormal; Notable for the following:    Casts GRANULAR CAST (*)    All other components within normal limits  CULTURE, BLOOD (ROUTINE X 2)  CULTURE, BLOOD (ROUTINE X 2)  WOUND CULTURE  C-REACTIVE PROTEIN    Imaging Review Dg Knee 2 Views Left  01/31/2014   CLINICAL DATA:  Fever. Recent diagnosis and treatment for osteomyelitis.  EXAM: LEFT KNEE - 1-2 VIEW  COMPARISON:  01/21/2014  FINDINGS: Since prior study, there has been additional resection of the tibial and fibular stumps. There is significant gas in the overlying soft tissues, which may all be postoperative in origin. Soft tissue infection with gas producing bacteria, however, is also possible. There are no radiopaque foreign bodies in the soft tissues.  There are no areas of bone resorption to suggest residual osteomyelitis.  IMPRESSION: No radiographic evidence of residual osteomyelitis. Significant soft tissue air in the distal amputated left leg soft tissues, deep to the surgical skin staples. This could all be postoperative in origin. Soft tissue infection is possible.   Electronically Signed   By: Lajean Manes M.D.   On: 01/31/2014 11:29     EKG Interpretation None      MDM   Final diagnoses:  Fever  Wound infection after surgery, initial encounter    Patient with recent wound infection and stump infection. Appears to have recurred. Sedimentation rate elevated soft tissue and air could be either from infection or due to recent surgery. Will admit to internal medicine with orthopedic consult discussed with Dr. Sharol Given.    Jasper Riling. Alvino Chapel, MD 01/31/14 1635

## 2014-01-31 NOTE — Discharge Summary (Signed)
I saw Mr. Wampole on day of discharge and assisted with the d/c planning.

## 2014-01-31 NOTE — Progress Notes (Signed)
Internal Medicine Clinic Attending  Case discussed with Dr. Kazibwe at the time of the visit.  We reviewed the resident's history and exam and pertinent patient test results.  I agree with the assessment, diagnosis, and plan of care documented in the resident's note. 

## 2014-01-31 NOTE — ED Notes (Addendum)
Bil AKa, Left leg amputated sept 4, second surgery 10/14, has been in intense pain since, fever at home 101, Has been taking percocet since with no relief.   Now complaining of constipation x 7 days as well.  Has not taken antihypertensive or diabetes meds this am because he felt he could not eat breakfast. Metallic taste in mouth.

## 2014-01-31 NOTE — Progress Notes (Signed)
[facility] [id number] Code Status: Full   Admission Data: 01/31/2014 3:40 PM Attending Provider:  Daryll Drown [id number], Delfino Lovett, MD Consults/ Treatment Team:    [name] is a 48 y.o. male patient admitted from ED awake, alert - oriented  X 3 - no acute distress noted.  VSS - Blood pressure 154/91, pulse 104, temperature 98.7 F (37.1 C), temperature source Oral, resp. rate 18, SpO2 100.00%.    IV in place, occlusive dsg intact without redness.  Orientation to room, and floor completed with information packet given to patient/family.  P  Admission INP armband ID verified with patient/family, and in place.   SR up x 2, fall assessment complete, with patient and family able to verbalize understanding of risk associated with falls, and verbalized understanding to call nsg before up out of bed.  Call light within reach, patient able to voice, and demonstrate understanding.    Will cont to eval and treat per MD orders.  Henriette Combs, [place] 01/31/2014 3:40 PM

## 2014-02-01 DIAGNOSIS — Z89511 Acquired absence of right leg below knee: Secondary | ICD-10-CM | POA: Diagnosis not present

## 2014-02-01 DIAGNOSIS — Z79891 Long term (current) use of opiate analgesic: Secondary | ICD-10-CM | POA: Diagnosis not present

## 2014-02-01 DIAGNOSIS — Z79899 Other long term (current) drug therapy: Secondary | ICD-10-CM | POA: Diagnosis not present

## 2014-02-01 DIAGNOSIS — F17210 Nicotine dependence, cigarettes, uncomplicated: Secondary | ICD-10-CM | POA: Diagnosis present

## 2014-02-01 DIAGNOSIS — T8744 Infection of amputation stump, left lower extremity: Secondary | ICD-10-CM | POA: Diagnosis not present

## 2014-02-01 DIAGNOSIS — B9620 Unspecified Escherichia coli [E. coli] as the cause of diseases classified elsewhere: Secondary | ICD-10-CM | POA: Diagnosis present

## 2014-02-01 DIAGNOSIS — I129 Hypertensive chronic kidney disease with stage 1 through stage 4 chronic kidney disease, or unspecified chronic kidney disease: Secondary | ICD-10-CM | POA: Diagnosis present

## 2014-02-01 DIAGNOSIS — T814XXA Infection following a procedure, initial encounter: Secondary | ICD-10-CM | POA: Diagnosis present

## 2014-02-01 DIAGNOSIS — F329 Major depressive disorder, single episode, unspecified: Secondary | ICD-10-CM | POA: Diagnosis present

## 2014-02-01 DIAGNOSIS — E1165 Type 2 diabetes mellitus with hyperglycemia: Secondary | ICD-10-CM | POA: Diagnosis present

## 2014-02-01 DIAGNOSIS — L03116 Cellulitis of left lower limb: Secondary | ICD-10-CM | POA: Diagnosis present

## 2014-02-01 DIAGNOSIS — Z89512 Acquired absence of left leg below knee: Secondary | ICD-10-CM | POA: Diagnosis not present

## 2014-02-01 DIAGNOSIS — E1142 Type 2 diabetes mellitus with diabetic polyneuropathy: Secondary | ICD-10-CM | POA: Diagnosis present

## 2014-02-01 DIAGNOSIS — N182 Chronic kidney disease, stage 2 (mild): Secondary | ICD-10-CM | POA: Diagnosis present

## 2014-02-01 DIAGNOSIS — Z794 Long term (current) use of insulin: Secondary | ICD-10-CM | POA: Diagnosis not present

## 2014-02-01 DIAGNOSIS — K210 Gastro-esophageal reflux disease with esophagitis: Secondary | ICD-10-CM | POA: Diagnosis present

## 2014-02-01 DIAGNOSIS — R509 Fever, unspecified: Secondary | ICD-10-CM | POA: Diagnosis present

## 2014-02-01 LAB — BASIC METABOLIC PANEL
ANION GAP: 14 (ref 5–15)
BUN: 12 mg/dL (ref 6–23)
CALCIUM: 8.7 mg/dL (ref 8.4–10.5)
CO2: 24 meq/L (ref 19–32)
Chloride: 98 mEq/L (ref 96–112)
Creatinine, Ser: 1.45 mg/dL — ABNORMAL HIGH (ref 0.50–1.35)
GFR, EST AFRICAN AMERICAN: 65 mL/min — AB (ref >90)
GFR, EST NON AFRICAN AMERICAN: 56 mL/min — AB (ref >90)
Glucose, Bld: 200 mg/dL — ABNORMAL HIGH (ref 70–99)
Potassium: 4 mEq/L (ref 3.7–5.3)
SODIUM: 136 meq/L — AB (ref 137–147)

## 2014-02-01 LAB — CBC
HEMATOCRIT: 22 % — AB (ref 39.0–52.0)
Hemoglobin: 7.3 g/dL — ABNORMAL LOW (ref 13.0–17.0)
MCH: 29 pg (ref 26.0–34.0)
MCHC: 33.2 g/dL (ref 30.0–36.0)
MCV: 87.3 fL (ref 78.0–100.0)
PLATELETS: 567 10*3/uL — AB (ref 150–400)
RBC: 2.52 MIL/uL — ABNORMAL LOW (ref 4.22–5.81)
RDW: 13.4 % (ref 11.5–15.5)
WBC: 11.9 10*3/uL — ABNORMAL HIGH (ref 4.0–10.5)

## 2014-02-01 LAB — GLUCOSE, CAPILLARY
GLUCOSE-CAPILLARY: 206 mg/dL — AB (ref 70–99)
Glucose-Capillary: 171 mg/dL — ABNORMAL HIGH (ref 70–99)
Glucose-Capillary: 231 mg/dL — ABNORMAL HIGH (ref 70–99)
Glucose-Capillary: 258 mg/dL — ABNORMAL HIGH (ref 70–99)

## 2014-02-01 MED ORDER — SENNOSIDES-DOCUSATE SODIUM 8.6-50 MG PO TABS
1.0000 | ORAL_TABLET | Freq: Every day | ORAL | Status: DC
  Administered 2014-02-02 – 2014-02-08 (×7): 1 via ORAL
  Filled 2014-02-01 (×6): qty 1

## 2014-02-01 NOTE — Progress Notes (Signed)
Subjective: Pt denies fevers and pain was well controlled last night. Pt has no complaints.   Objective: Vital signs in last 24 hours: Filed Vitals:   01/31/14 1534 01/31/14 1914 01/31/14 2148 02/01/14 0506  BP: 154/91  156/82 143/81  Pulse: 104  98 92  Temp: 98.7 F (37.1 C)  98.7 F (37.1 C) 98.2 F (36.8 C)  TempSrc: Oral  Oral Oral  Resp: 18  16 16   Weight:  205 lb 0.4 oz (93 kg)    SpO2: 100%  100% 100%   Weight change:   Intake/Output Summary (Last 24 hours) at 02/01/14 8756 Last data filed at 02/01/14 0424  Gross per 24 hour  Intake    450 ml  Output    300 ml  Net    150 ml   General Appearance:  Alert, cooperative, no distress, appears stated age   Head:  Normocephalic, without obvious abnormality, atraumatic   Eyes:   conjunctiva/corneas clear, EOM's intact   Lungs:  Clear to auscultation bilaterally, respirations unlabored   Heart:  Regular rate and rhythm, S1 and S2 normal, no murmur, rub or gallop   Abdomen:  Soft, non-tender, bowel sounds active   Extremities:  B/l BKA, lt bka sutures intact, anterior aspect with red fluid oozing btwn sutures, no odor   Neurologic:  CNII-XII grossly intact.       Lab Results: Basic Metabolic Panel:  Recent Labs Lab 01/31/14 0957 02/01/14 0608  NA 132* 136*  K 4.1 4.0  CL 96 98  CO2 23 24  GLUCOSE 265* 200*  BUN 14 12  CREATININE 1.42* 1.45*  CALCIUM 9.0 8.7   CBC:  Recent Labs Lab 01/31/14 0957 02/01/14 0608  WBC 14.1* 11.9*  NEUTROABS 11.2*  --   HGB 8.1* 7.3*  HCT 24.5* 22.0*  MCV 87.5 87.3  PLT 543* 567*   CBG:  Recent Labs Lab 01/25/14 1037 01/25/14 1207 01/31/14 1739 01/31/14 2147 02/01/14 0821  GLUCAP 256* 179* 271* 194* 171*   Urinalysis:  Recent Labs Lab 01/31/14 1445  COLORURINE YELLOW  LABSPEC 1.027  PHURINE 6.0  GLUCOSEU >1000*  HGBUR TRACE*  BILIRUBINUR NEGATIVE  KETONESUR NEGATIVE  PROTEINUR 100*  UROBILINOGEN 1.0  NITRITE NEGATIVE  LEUKOCYTESUR NEGATIVE     Studies/Results: Dg Knee 2 Views Left  01/31/2014   CLINICAL DATA:  Fever. Recent diagnosis and treatment for osteomyelitis.  EXAM: LEFT KNEE - 1-2 VIEW  COMPARISON:  01/21/2014  FINDINGS: Since prior study, there has been additional resection of the tibial and fibular stumps. There is significant gas in the overlying soft tissues, which may all be postoperative in origin. Soft tissue infection with gas producing bacteria, however, is also possible. There are no radiopaque foreign bodies in the soft tissues.  There are no areas of bone resorption to suggest residual osteomyelitis.  IMPRESSION: No radiographic evidence of residual osteomyelitis. Significant soft tissue air in the distal amputated left leg soft tissues, deep to the surgical skin staples. This could all be postoperative in origin. Soft tissue infection is possible.   Electronically Signed   By: Lajean Manes M.D.   On: 01/31/2014 11:29   Medications: I have reviewed the patient's current medications. Scheduled Meds: . enoxaparin (LOVENOX) injection  40 mg Subcutaneous Q24H  . FLUoxetine  10 mg Oral Daily  . gabapentin  600 mg Oral TID  . insulin aspart  0-5 Units Subcutaneous QHS  . insulin aspart  0-9 Units Subcutaneous TID WC  . insulin aspart  4 Units Subcutaneous TID WC  . insulin glargine  32 Units Subcutaneous QHS  . lisinopril  10 mg Oral Daily  . piperacillin-tazobactam (ZOSYN)  IV  3.375 g Intravenous Q8H  . polyethylene glycol  17 g Oral Daily  . senna-docusate  1 tablet Oral BID  . sodium chloride  3 mL Intravenous Q12H  . vancomycin  1,000 mg Intravenous Q12H   Continuous Infusions:  PRN Meds:.oxyCODONE-acetaminophen, pantoprazole, promethazine Assessment/Plan: Active Problems:   Cellulitis and abscess of leg   Cellulitis of left BKA  - Dr. Sharol Given to see patient today -day 2 of IV vanc and zosyn - WBCs trended down to 11.9 from 14.1 yesterday, CBC tomorrow  - f/u blood cultures x 2  - wound culture was  not collected prior to starting abx, will cancel order - percocet q4h prn for pain control   Diabetes mellitus type 2, uncontrolled, with complications --Patient is prone to hypoglycemia per previous documentation. A1c 9.6 on 10/14.  - Lantus 32 u daily  - Continue Mealtime 4u  - Continue SSI-S  -Continue home gabapentin 600 mg TID.   Essential hypertension- currently normotensive - continue Lisinopril 10mg    CKD Stage 2-- Cr 1.42 improved from baseline of 1.51.  -Continue to monitor BMP.   DVT prophylaxis- lovenox   Diet- carb modified  Dispo: Disposition is deferred at this time, awaiting improvement of current medical problems.   The patient does have a current PCP [name], MD) and does need an [facility] hospital follow-up appointment after discharge.   The patient does not have transportation limitations that hinder transportation to clinic appointments. .Services Needed at time of discharge: Y = Yes, Blank = No PT:   OT:   RN:   Equipment:   Other:     LOS: 1 day   Julious Oka, MD 02/01/2014, 9:17 AM

## 2014-02-01 NOTE — Progress Notes (Signed)
Utilization Review Completed.   Karter Hellmer, RN, BSN Nurse Case Manager  

## 2014-02-01 NOTE — H&P (Signed)
  Date: 02/01/2014  Patient name: [name]  Medical record number: [id number]  Date of birth: 1965/12/30   I have seen and evaluated [name] and discussed their care with the Residency Team. Briefly, [name] is a 48yo man with PMH of DM, HTN, CKD, GERD and b/l BKA who presented with left leg pain, fever, chills and constipation.  He was recently admitted to the hospital and underwent a stump revision on 10/16 by Dr. Sharol Given for osteomyelitis on the left.  At the same site, he now also has tenderness and drainage.  He had a bowel movement last night and only goes about once per week. He was not deemed to need antibiotics on discharge given his surgical removal of infected bone.  WBC elevated to 14.  Xray of leg shows soft tissue air and swelling, infection cannot be ruled out.   Assessment and Plan: I have seen and evaluated the patient as outlined above. I agree with the formulated Assessment and Plan as detailed in the residents' admission note, with the following changes:   1. Cellulitis of left BKA - Consult Dr. Jess Barters team - Vanc/Zosyn - BC and WC pending - Percocet for pain control - He meets 2/4 of SIRS criteria from admission vitals.   2. DM2, uncontrolled - Continue medications of Lantus, mealtime insulin and SSI - Gabapentin  Other issues stable currently, as per resident note.   Sid Falcon, MD 10/25/201510:42 AM

## 2014-02-02 LAB — GLUCOSE, CAPILLARY
GLUCOSE-CAPILLARY: 220 mg/dL — AB (ref 70–99)
GLUCOSE-CAPILLARY: 218 mg/dL — AB (ref 70–99)
Glucose-Capillary: 146 mg/dL — ABNORMAL HIGH (ref 70–99)
Glucose-Capillary: 177 mg/dL — ABNORMAL HIGH (ref 70–99)

## 2014-02-02 LAB — RETICULOCYTES
RBC.: 2.6 MIL/uL — ABNORMAL LOW (ref 4.22–5.81)
RETIC COUNT ABSOLUTE: 36.4 10*3/uL (ref 19.0–186.0)
RETIC CT PCT: 1.4 % (ref 0.4–3.1)

## 2014-02-02 MED ORDER — CYCLOBENZAPRINE HCL 10 MG PO TABS
5.0000 mg | ORAL_TABLET | Freq: Three times a day (TID) | ORAL | Status: DC | PRN
  Administered 2014-02-02 – 2014-02-09 (×9): 5 mg via ORAL
  Filled 2014-02-02 (×9): qty 1

## 2014-02-02 NOTE — Progress Notes (Addendum)
Subjective: Pt complaining of muscle cramping type pain in left stump that is not controlled w/ percocet. This is the first time he has experienced this pain and it started last night around 11:00pm.   Objective: Vital signs in last 24 hours: Filed Vitals:   02/01/14 0506 02/01/14 1503 02/01/14 2128 02/02/14 0504  BP: 143/81 156/71  144/82  Pulse: 92 107  106  Temp: 98.2 F (36.8 C) 98.6 F (37 C)  98.4 F (36.9 C)  TempSrc: Oral Oral Oral Oral  Resp: 16 16  18   Height:      Weight:      SpO2: 100% 100%  99%   Weight change:   Intake/Output Summary (Last 24 hours) at 02/02/14 0854 Last data filed at 02/02/14 0504  Gross per 24 hour  Intake    493 ml  Output   1000 ml  Net   -507 ml   General Appearance:  Alert, cooperative, no distress, appears stated age   Head:  Normocephalic, without obvious abnormality, atraumatic   Eyes:   conjunctiva/corneas clear, EOM's intact   Lungs:  Clear to auscultation bilaterally, respirations unlabored   Heart:  Regular rate and rhythm, S1 and S2 normal, no murmur, rub or gallop   Abdomen:  Soft, non-tender, bowel sounds active   Extremities:  B/l BKA, lt bka sutures intact, no oozing of left leg inicision, tender to palpation of left stump incision site  Neurologic:  CNII-XII grossly intact.       Lab Results: Basic Metabolic Panel:  Recent Labs Lab 01/31/14 0957 02/01/14 0608  NA 132* 136*  K 4.1 4.0  CL 96 98  CO2 23 24  GLUCOSE 265* 200*  BUN 14 12  CREATININE 1.42* 1.45*  CALCIUM 9.0 8.7   CBC:  Recent Labs Lab 01/31/14 0957 02/01/14 0608  WBC 14.1* 11.9*  NEUTROABS 11.2*  --   HGB 8.1* 7.3*  HCT 24.5* 22.0*  MCV 87.5 87.3  PLT 543* 567*   CBG:  Recent Labs Lab 01/31/14 2147 02/01/14 0821 02/01/14 1227 02/01/14 1649 02/01/14 2130 02/02/14 0749  GLUCAP 194* 171* 231* 258* 206* 146*   Urinalysis:  Recent Labs Lab 01/31/14 1445  COLORURINE YELLOW  LABSPEC 1.027  PHURINE 6.0  GLUCOSEU >1000*    HGBUR TRACE*  BILIRUBINUR NEGATIVE  KETONESUR NEGATIVE  PROTEINUR 100*  UROBILINOGEN 1.0  NITRITE NEGATIVE  LEUKOCYTESUR NEGATIVE    Studies/Results: Dg Knee 2 Views Left  01/31/2014   CLINICAL DATA:  Fever. Recent diagnosis and treatment for osteomyelitis.  EXAM: LEFT KNEE - 1-2 VIEW  COMPARISON:  01/21/2014  FINDINGS: Since prior study, there has been additional resection of the tibial and fibular stumps. There is significant gas in the overlying soft tissues, which may all be postoperative in origin. Soft tissue infection with gas producing bacteria, however, is also possible. There are no radiopaque foreign bodies in the soft tissues.  There are no areas of bone resorption to suggest residual osteomyelitis.  IMPRESSION: No radiographic evidence of residual osteomyelitis. Significant soft tissue air in the distal amputated left leg soft tissues, deep to the surgical skin staples. This could all be postoperative in origin. Soft tissue infection is possible.   Electronically Signed   By: Lajean Manes M.D.   On: 01/31/2014 11:29   Medications: I have reviewed the patient's current medications. Scheduled Meds: . enoxaparin (LOVENOX) injection  40 mg Subcutaneous Q24H  . FLUoxetine  10 mg Oral Daily  . gabapentin  600 mg  Oral TID  . insulin aspart  0-5 Units Subcutaneous QHS  . insulin aspart  0-9 Units Subcutaneous TID WC  . insulin aspart  4 Units Subcutaneous TID WC  . insulin glargine  32 Units Subcutaneous QHS  . lisinopril  10 mg Oral Daily  . piperacillin-tazobactam (ZOSYN)  IV  3.375 g Intravenous Q8H  . polyethylene glycol  17 g Oral Daily  . senna-docusate  1 tablet Oral QHS  . sodium chloride  3 mL Intravenous Q12H  . vancomycin  1,000 mg Intravenous Q12H   Continuous Infusions:  PRN Meds:.oxyCODONE-acetaminophen, pantoprazole, promethazine Assessment/Plan: Active Problems:   Cellulitis and abscess of leg   Cellulitis of left BKA  - spoke w/ Dr. Sharol Given who will see  patient today -day 3 of IV vanc and zosyn - WBCs trended down to 11.9 on 10/25 from 14.1 the previous day. Pt afebrile, will hold off on morning labs at this time - blood cultures x 2 NGTD - percocet q4h prn for pain control  - flexeril 5mg  TID prn for muscle cramping - PT consulted to help pt get out of bed  Diabetes mellitus type 2, uncontrolled, with complications --Patient is prone to hypoglycemia per previous documentation. A1c 9.6 on 10/14. CBGs stable - Lantus 32 u daily  - Continue Mealtime 4u  - Continue SSI-S  -Continue home gabapentin 600 mg TID.   Essential hypertension- currently normotensive - continue Lisinopril 10mg    CKD Stage 2-- Cr 1.45 10/25 improved from baseline of 1.51.  -will continue to monitor   DVT prophylaxis- lovenox   Diet- carb modified  Dispo: Disposition is deferred at this time, awaiting improvement of current medical problems.   The patient does have a current PCP [name], MD) and does need an [facility] hospital follow-up appointment after discharge.   The patient does not have transportation limitations that hinder transportation to clinic appointments. .Services Needed at time of discharge: Y = Yes, Blank = No PT:   OT:   RN:   Equipment:   Other:     LOS: 2 days   Julious Oka, MD 02/02/2014, 8:54 AM

## 2014-02-02 NOTE — Progress Notes (Signed)
  Date: 02/02/2014  Patient name: [name]  Medical record number: [id number]  Date of birth: April 06, 1966   This patient has been seen and the plan of care was discussed with the house staff. Please see their note for complete details. I concur with their findings with the following additions/corrections:  Patient with stump cellulitis, on vancomycin and zosyn, WBC is trending down and site looks better.  Patient with muscle spasm today.  He is also noted to have a Hgb trending down.  Would work up for anemia with iron panel, consider FOBT.  Likely related in part to chronic disease given his DM and CKD, however, he could have concomitant iron deficiency.  Sid Falcon, MD 02/02/2014, 1:13 PM

## 2014-02-02 NOTE — Care Management Note (Unsigned)
    Page 1 of 1   02/09/2014     5:30:17 PM CARE MANAGEMENT NOTE 02/09/2014  Patient:  [name], [name]   Account Number:  [id number]  Date Initiated:  02/02/2014  Documentation initiated by:  Tomi Bamberger  Subjective/Objective Assessment:   dx cellulitis  admit- lives with spouse.   Patient active with [facility].     Action/Plan:   Anticipated DC Date:  02/04/2014   Anticipated DC Plan:  [place]  CM consult      [facility] Choice  Resumption Of Svcs/PTA Provider   Choice offered to / List presented to:             Status of service:  In process, will continue to follow Medicare Important Message given?  NO (If response is "NO", the following Medicare IM given date fields will be blank) Date Medicare IM given:   Medicare IM given by:   Date Additional Medicare IM given:   Additional Medicare IM given by:    Discharge Disposition:    Per UR Regulation:    If discussed at Long Length of Stay Meetings, dates discussed:    Comments:  02/09/14 [place], BSN [phone] patient has a apt scheduled at the [facility] for 11/12 at 9 am, patient has informed CSW he wants to go home instead of to snf.  Awaiting Dr. Jess Barters input for wound care.  02/02/14 [place], BSN [phone] patient may be active with [facility] services, NCM will need to check to see what services they are providing.

## 2014-02-02 NOTE — Progress Notes (Signed)
Patient ID: [name], male   DOB: November 13, 1965, 48 y.o.   MRN: [id number] Patient is a 48 year old gentleman status post revision left transtibial amputation. Examination the skin edges are well approximated there is no drainage no cellulitis. Patient is tender to palpation which is consistent with deep infection. I agree with continuing the IV antibiotics would continue these for several more days and anticipate patient could be discharged on oral antibiotics. Do not see an indication for revision surgery at this time.

## 2014-02-02 NOTE — Progress Notes (Addendum)
Inpatient Diabetes Program Recommendations  AACE/ADA: New Consensus Statement on Inpatient Glycemic Control (2013)  Target Ranges:  Prepandial:   less than 140 mg/dL      Peak postprandial:   less than 180 mg/dL (1-2 hours)      Critically ill patients:  140 - 180 mg/dL   Reason for Assessment:  Results for [name], [name] (MRN [id number]) as of 02/02/2014 12:51  Ref. Range 02/01/2014 12:27 02/01/2014 16:49 02/01/2014 21:30 02/02/2014 07:49 02/02/2014 11:52  Glucose-Capillary Latest Range: 70-99 mg/dL 231 (H) 258 (H) 206 (H) 146 (H) 220 (H)   Diabetes history: Type 2 diabetes/ note that history states he has history of hypoglycemia. Outpatient Diabetes medications: Lantus 32 units daily, Novolog Current orders for Inpatient glycemic control:  Lantus 32 units daily, Novolog sensitive tid with meals and HS, Novolog meal coverage 4 units tid with meals  May consider slight increase in Novolog meal coverage to 6 units tid with meals.    Thanks, Adah Perl, RN, BC-ADM Inpatient Diabetes Coordinator Pager [phone]

## 2014-02-03 ENCOUNTER — Inpatient Hospital Stay (HOSPITAL_COMMUNITY): Payer: Medicaid Other

## 2014-02-03 DIAGNOSIS — Z89511 Acquired absence of right leg below knee: Secondary | ICD-10-CM

## 2014-02-03 DIAGNOSIS — L03119 Cellulitis of unspecified part of limb: Secondary | ICD-10-CM

## 2014-02-03 DIAGNOSIS — D649 Anemia, unspecified: Secondary | ICD-10-CM

## 2014-02-03 DIAGNOSIS — Z794 Long term (current) use of insulin: Secondary | ICD-10-CM

## 2014-02-03 DIAGNOSIS — L02419 Cutaneous abscess of limb, unspecified: Secondary | ICD-10-CM

## 2014-02-03 DIAGNOSIS — I129 Hypertensive chronic kidney disease with stage 1 through stage 4 chronic kidney disease, or unspecified chronic kidney disease: Secondary | ICD-10-CM

## 2014-02-03 LAB — CBC
HCT: 24.3 % — ABNORMAL LOW (ref 39.0–52.0)
Hemoglobin: 7.7 g/dL — ABNORMAL LOW (ref 13.0–17.0)
MCH: 28.3 pg (ref 26.0–34.0)
MCHC: 31.7 g/dL (ref 30.0–36.0)
MCV: 89.3 fL (ref 78.0–100.0)
Platelets: 687 10*3/uL — ABNORMAL HIGH (ref 150–400)
RBC: 2.72 MIL/uL — ABNORMAL LOW (ref 4.22–5.81)
RDW: 13.5 % (ref 11.5–15.5)
WBC: 18.6 10*3/uL — ABNORMAL HIGH (ref 4.0–10.5)

## 2014-02-03 LAB — IRON AND TIBC: UIBC: 157 ug/dL (ref 125–400)

## 2014-02-03 LAB — BASIC METABOLIC PANEL
ANION GAP: 16 — AB (ref 5–15)
BUN: 21 mg/dL (ref 6–23)
CO2: 22 mEq/L (ref 19–32)
Calcium: 9 mg/dL (ref 8.4–10.5)
Chloride: 96 mEq/L (ref 96–112)
Creatinine, Ser: 1.65 mg/dL — ABNORMAL HIGH (ref 0.50–1.35)
GFR calc non Af Amer: 48 mL/min — ABNORMAL LOW (ref >90)
GFR, EST AFRICAN AMERICAN: 55 mL/min — AB (ref >90)
Glucose, Bld: 320 mg/dL — ABNORMAL HIGH (ref 70–99)
POTASSIUM: 4.7 meq/L (ref 3.7–5.3)
SODIUM: 134 meq/L — AB (ref 137–147)

## 2014-02-03 LAB — VITAMIN B12: VITAMIN B 12: 395 pg/mL (ref 211–911)

## 2014-02-03 LAB — FERRITIN: Ferritin: 151 ng/mL (ref 22–322)

## 2014-02-03 LAB — GLUCOSE, CAPILLARY
GLUCOSE-CAPILLARY: 257 mg/dL — AB (ref 70–99)
Glucose-Capillary: 289 mg/dL — ABNORMAL HIGH (ref 70–99)
Glucose-Capillary: 294 mg/dL — ABNORMAL HIGH (ref 70–99)
Glucose-Capillary: 209 mg/dL — ABNORMAL HIGH (ref 70–99)

## 2014-02-03 LAB — FOLATE: Folate: 4.8 ng/mL

## 2014-02-03 MED ORDER — DOCUSATE SODIUM 100 MG PO CAPS
100.0000 mg | ORAL_CAPSULE | Freq: Every day | ORAL | Status: DC | PRN
  Administered 2014-02-06 – 2014-02-08 (×3): 100 mg via ORAL
  Filled 2014-02-03 (×3): qty 1

## 2014-02-03 MED ORDER — FERROUS SULFATE 325 (65 FE) MG PO TABS
325.0000 mg | ORAL_TABLET | Freq: Three times a day (TID) | ORAL | Status: DC
  Administered 2014-02-03 – 2014-02-09 (×19): 325 mg via ORAL
  Filled 2014-02-03 (×21): qty 1

## 2014-02-03 NOTE — Progress Notes (Signed)
Subjective: Muscle cramping in left leg controlled w/ flexeril. No complaints.   Objective: Vital signs in last 24 hours: Filed Vitals:   02/02/14 1348 02/02/14 2122 02/02/14 2209 02/03/14 0630  BP: 156/96 158/87  179/90  Pulse: 106 110  109  Temp: 99.2 F (37.3 C) 100.2 F (37.9 C) 100.1 F (37.8 C) 98.7 F (37.1 C)  TempSrc: Oral Oral Oral Oral  Resp: 18 18  18   Height:      Weight:      SpO2: 100% 98%  100%   Weight change:   Intake/Output Summary (Last 24 hours) at 02/03/14 0925 Last data filed at 02/03/14 0453  Gross per 24 hour  Intake    480 ml  Output   1925 ml  Net  -1445 ml   General Appearance:  Alert, cooperative, no distress, appears stated age   Head:  Normocephalic, without obvious abnormality, atraumatic   Eyes:   conjunctiva/corneas clear, EOM's intact   Lungs:  respirations unlabored , no accessory muscle use, CTAB  Heart:  Regular rate and rhythm, S1 and S2 normal, no murmur, rub or gallop   Abdomen:  Soft, non-tender, bowel sounds active   Extremities:  B/l BKA, lt bka sutures intact, red fluid on dressing beneath lt stump, no active oozing or pus at wound site, tender to palpation of lt stump  Neurologic:  CNII-XII grossly intact.       Lab Results: Basic Metabolic Panel:  Recent Labs Lab 02/01/14 0608 02/03/14 0655  NA 136* 134*  K 4.0 4.7  CL 98 96  CO2 24 22  GLUCOSE 200* 320*  BUN 12 21  CREATININE 1.45* 1.65*  CALCIUM 8.7 9.0   CBC:  Recent Labs Lab 01/31/14 0957 02/01/14 0608 02/03/14 0655  WBC 14.1* 11.9* 18.6*  NEUTROABS 11.2*  --   --   HGB 8.1* 7.3* 7.7*  HCT 24.5* 22.0* 24.3*  MCV 87.5 87.3 89.3  PLT 543* 567* 687*   CBG:  Recent Labs Lab 02/01/14 2130 02/02/14 0749 02/02/14 1152 02/02/14 1647 02/02/14 2125 02/03/14 0733  GLUCAP 206* 146* 220* 218* 177* 289*   Urinalysis:  Recent Labs Lab 01/31/14 1445  COLORURINE YELLOW  LABSPEC 1.027  PHURINE 6.0  GLUCOSEU >1000*  HGBUR TRACE*    BILIRUBINUR NEGATIVE  KETONESUR NEGATIVE  PROTEINUR 100*  UROBILINOGEN 1.0  NITRITE NEGATIVE  LEUKOCYTESUR NEGATIVE    Studies/Results: No results found. Medications: I have reviewed the patient's current medications. Scheduled Meds: . enoxaparin (LOVENOX) injection  40 mg Subcutaneous Q24H  . ferrous sulfate  325 mg Oral TID WC  . FLUoxetine  10 mg Oral Daily  . gabapentin  600 mg Oral TID  . insulin aspart  0-5 Units Subcutaneous QHS  . insulin aspart  0-9 Units Subcutaneous TID WC  . insulin aspart  4 Units Subcutaneous TID WC  . insulin glargine  32 Units Subcutaneous QHS  . lisinopril  10 mg Oral Daily  . piperacillin-tazobactam (ZOSYN)  IV  3.375 g Intravenous Q8H  . polyethylene glycol  17 g Oral Daily  . senna-docusate  1 tablet Oral QHS  . sodium chloride  3 mL Intravenous Q12H  . vancomycin  1,000 mg Intravenous Q12H   Continuous Infusions:  PRN Meds:.cyclobenzaprine, docusate sodium, oxyCODONE-acetaminophen, pantoprazole, promethazine Assessment/Plan: Active Problems:   Cellulitis and abscess of leg   Cellulitis of left BKA  - no indication for sx, continue IV abx per Dr. Sharol Given - day 4 of IV vanc and zosyn -  WBCs trended up to 18.6 from 11.9 two days ago, pt afebrile but still tachycardic in the low 100's. Will get CT of left knee - ID will see patient today - blood cultures x 2 NGTD - percocet q4h prn for pain control  - flexeril 5mg  TID prn for muscle cramping - PT consulted to help pt get out of bed  Diabetes mellitus type 2, uncontrolled, with complications --Patient is prone to hypoglycemia per previous documentation. A1c 9.6 on 10/14. CBGs stable - Lantus 32 u daily  - Continue Mealtime 4u  - Continue SSI-S  -Continue home gabapentin 600 mg TID.   Anemia--likely anemia of chronic disease ( Fe low, TIBC low, ferritin 151) - Ferrous sulfate 325 TID - colace prn for constipation  Thrombocytosis-- likely reactive 2/2 to wound infxn.  - will  continue to monitor  Essential hypertension-  - continue Lisinopril 10mg    CKD Stage 2-- Cr 1.45 10/25 improved from baseline of 1.51.  -Cr 1.65 around baseline, will continue to monitor.   DVT prophylaxis- lovenox   Diet- carb modified  Dispo: Disposition is deferred at this time, awaiting improvement of current medical problems.   The patient does have a current PCP [name], MD) and does need an [facility] hospital follow-up appointment after discharge.   The patient does not have transportation limitations that hinder transportation to clinic appointments. .Services Needed at time of discharge: Y = Yes, Blank = No PT:   OT:   RN:   Equipment:   Other:     LOS: 3 days   Julious Oka, MD 02/03/2014, 9:25 AM

## 2014-02-03 NOTE — Progress Notes (Signed)
Inpatient Diabetes Program Recommendations  AACE/ADA: New Consensus Statement on Inpatient Glycemic Control (2013)  Target Ranges:  Prepandial:   less than 140 mg/dL      Peak postprandial:   less than 180 mg/dL (1-2 hours)      Critically ill patients:  140 - 180 mg/dL   Results for [name], [name] (MRN [id number]) as of 02/03/2014 10:16  Ref. Range 02/02/2014 07:49 02/02/2014 11:52 02/02/2014 16:47 02/02/2014 21:25 02/03/2014 07:33  Glucose-Capillary Latest Range: 70-99 mg/dL 146 (H) 220 (H) 218 (H) 177 (H) 289 (H)   Diabetes history: DM2 Outpatient Diabetes medications: Lantus 32 units daily, Novolog 0-10 units TID with meals  Current orders for Inpatient glycemic control: Lantus 32 units QHS, Novolog 0-9 units AC, Novolog 0-5 units HS, Novolog 4 units TID with meals  Inpatient Diabetes Program Recommendations Insulin - Meal Coverage: Post prandial glucose consistently elevated despite Novolog 4 units with meals. Please increase meal coverage to Novolog 7 units TID with meals.  Thanks, Barnie Alderman, RN, MSN, CCRN Diabetes Coordinator Inpatient Diabetes Program [phone] (Team Pager) [phone] ([facility] office) [phone] [facility] office)

## 2014-02-03 NOTE — Progress Notes (Signed)
ANTIBIOTIC CONSULT NOTE - FOLLOW UP  Pharmacy Consult for Vancomycin and Zosyn Indication: wound infection  No Known Allergies  Patient Measurements: Height: 5' 7"  (170.2 cm) (Pt height before bilateral BKA ) Weight: 205 lb 0.4 oz (93 kg) IBW/kg (Calculated) : 66.1  Vital Signs: Temp: 98.6 F (37 C) (10/27 1316) Temp Source: Oral (10/27 1316) BP: 140/77 mmHg (10/27 1316) Pulse Rate: 105 (10/27 1316) Intake/Output from previous day: 10/26 0701 - 10/27 0700 In: 483 [P.O.:480; I.V.:3] Out: 1925 [Urine:1925] Intake/Output from this shift: Total I/O In: 600 [P.O.:600] Out: 375 [Urine:375]  Labs:  Recent Labs  02/01/14 0608 02/03/14 0655  WBC 11.9* 18.6*  HGB 7.3* 7.7*  PLT 567* 687*  CREATININE 1.45* 1.65*   Estimated Creatinine Clearance: 59.6 ml/min (by C-G formula based on Cr of 1.65).  Assessment:  Day # 4 Vancomycin and Zosyn. Tmax 100.2,  WBC up to 18.6. ESR and CRP elevated 01/31/14. New wound culture sent today. Concern for abscess or deep tissue infection noted.    BUN/creatinine have trended up some. Baseline Scr ~1.5.   Also on ACEi.  Vancomycin trough level was therapeutic (17.1 mcg/ml) on same regimen on 01/24/14, when Scr was 1.53.  Goal of Therapy:  Vancomycin trough level 15-20 mcg/ml appropriate Zosyn dose for renal function and infection  Plan:   Continue Vancomycin 1 gram IV q12hrs.  Continue Zosyn 3.375 grams IV q8hrs (each over 4 hours)  Follow renal function, culture data and progress.  Will consider checking Vancomycin trough level this week.  Arty Baumgartner, [place] Pager: [phone] 02/03/2014,5:01 PM

## 2014-02-03 NOTE — Evaluation (Signed)
Physical Therapy Evaluation Patient Details Name: [name] MRN: [id number] DOB: 1965-07-12 Today's Date: 02/03/2014   History of Present Illness  [name] is a 48 y.o. 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 He was admitted on 10/14 for fevers, leukocytosis, redness/pain and drainage from incision site of L-BKA. He underwent revision/IX D on 10/16, no OR cultures sent and not discharged home on antibiotics. Initially doing well after being discharged but again started to have fevers, chills, erythema, pain and drainage from incision site. He was readmitted on 10/24 with temp of 101F x 2 days, pain to stump, drainage and found to have leukocytosis of 14K. We was started again on vancomycin and piptazo. Blood cx ngtd. Evaluated by Dr. Sharol Given who felt no need for further surgery. He still is having sanginous purulent drainage from his leg. Hx of Rt BKA.  Clinical Impression  Pt admitted with above. Pt currently with functional limitations due to the deficits listed below (see PT Problem List). Willing to perform transfer training this afternoon however reports he is in too much pain to tolerate staying in chair at the end of his physical therapy session. Performs transfers at a supervision level via posterior scoot from bed to chair. He requests an elevating leg rest for his wheelchair that has a better adjustable calf pad to provide support for his residual limb in an extended position. Pt will benefit from skilled PT to increase their independence and safety with mobility to allow discharge to the venue listed below.       Follow Up Recommendations Home health PT;Supervision/Assistance - 24 hour    Equipment Recommendations  Other (comment) (Left elevating leg rest with adjustable calf pad for support)    Recommendations for Other Services       Precautions / Restrictions Precautions Precautions:  Fall Precaution Comments: pt. with WC with incorrect anti-tippers and left arm rest that will not lock into place.   Restrictions Weight Bearing Restrictions: No Other Position/Activity Restrictions: bilateral BKAs      Mobility  Bed Mobility Overal bed mobility: Modified Independent             General bed mobility comments: manages up to long sitting independently   Transfers Overall transfer level: Needs assistance Equipment used: None Transfers: Comptroller transfers: Supervision;+2 safety/equipment   General transfer comment: Supervision for safety. Willing to perform posterior transfer from bed to chair and anterior transfer back to bed. Unwilling to sit in chair at end of therapy session due to pain.  No physical assist was required and pt was able to maintain safe positioning of Lt residual limb during transfer. States this is the technique he uses to enter/exit his wheelchair at home.  Ambulation/Gait                Stairs            Wheelchair Mobility    Modified Rankin (Stroke Patients Only)       Balance Overall balance assessment: Needs assistance Sitting-balance support: No upper extremity supported Sitting balance-Leahy Scale: Good                                       Pertinent Vitals/Pain Pain Assessment: 0-10 Pain Score: 9  Pain Location: Lt residual  limb Pain Descriptors / Indicators: Constant Pain Intervention(s): Monitored during session;Repositioned (declines RN to bring pain medicine)    Home Living Family/patient expects to be discharged to:: Private residence Living Arrangements: Spouse/significant other;Other relatives (brother lives in home but not around much) Available Help at Discharge: Family;Available 24 hours/day Type of Home: House Home Access: Ramped entrance     Home Layout: One level Home Equipment: Wheelchair - [employer] - 2 wheels;Bedside  commode (sliding board) Additional Comments: Pt states his current Left elevating leg rest is ill fitting. Calf pad does not rise high enough to support Lt residual limb.    Prior Function Level of Independence: Independent with assistive device(s)         Comments: has right prosthesis but states it is too big and needs new one     Hand Dominance   Dominant Hand: Right    Extremity/Trunk Assessment   Upper Extremity Assessment: Defer to OT evaluation           Lower Extremity Assessment: RLE deficits/detail;LLE deficits/detail RLE Deficits / Details: Previous Rt BKA. Good ROM and strength LLE Deficits / Details: Edematous residual limb with drainage soaking through 2 blankets and a sheet. RN notified and is aware.  Cervical / Trunk Assessment: Normal  Communication   Communication: No difficulties  Cognition Arousal/Alertness: Awake/alert Behavior During Therapy: WFL for tasks assessed/performed Overall Cognitive Status: Within Functional Limits for tasks assessed                      General Comments General comments (skin integrity, edema, etc.): Significant drainage from Lt residual limb - RN aware.    Exercises        Assessment/Plan    PT Assessment Patient needs continued PT services  PT Diagnosis Acute pain;Other (comment) (difficulty transferring)   PT Problem List Decreased balance;Decreased mobility;Decreased knowledge of use of DME;Decreased strength;Decreased range of motion;Decreased activity tolerance;Pain  PT Treatment Interventions DME instruction;Functional mobility training;Therapeutic activities;Therapeutic exercise;Neuromuscular re-education;Patient/family education;Wheelchair mobility training;Modalities   PT Goals (Current goals can be found in the Care Plan section) Acute Rehab PT Goals Patient Stated Goal: go back home PT Goal Formulation: With patient Time For Goal Achievement: 02/10/14 Potential to Achieve Goals: Good     Frequency Min 3X/week   Barriers to discharge        Co-evaluation               End of Session   Activity Tolerance: Patient tolerated treatment well Patient left: in bed;with call bell/phone within reach;with family/visitor present Nurse Communication: Mobility status;Other (comment) (LLE drainage)         Time: 4801-6553 PT Time Calculation (min): 26 min   Charges:   PT Evaluation $Initial PT Evaluation Tier I: 1 Procedure PT Treatments $Therapeutic Activity: 8-22 mins   PT G Codes:         Elayne Snare, [place]  Ellouise Newer 02/03/2014, 3:00 PM

## 2014-02-03 NOTE — Progress Notes (Signed)
  Date: 02/03/2014  Patient name: KYMARI NUON  Medical record number: [id number]  Date of birth: 02-Apr-1966   This patient has been seen and the plan of care was discussed with the house staff. Please see their note for complete details. I concur with their findings with the following additions/corrections:  ID has seen patient, CT shows a complex fluid collection concerning for abscess.  Orthopedics was informed and awaiting further management recommendations regarding need for further surgery.  Continue IV antibiotics for now.  WC sent by ID, Dr. Baxter Flattery.  Sid Falcon, MD 02/03/2014, 3:40 PM

## 2014-02-03 NOTE — Consult Note (Signed)
[facility]  Total days of antibiotics 4        Day 4 vanco        Day 4 piptazo               Reason for Consult: deep wound infection    Referring Physician: mullen  Active Problems:   Cellulitis and abscess of leg    HPI: [name] is a 48 y.o. 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 He was admitted on 10/14 for fevers, leukocytosis, redness/pain and drainage from incision site of L-BKA. He underwent revision/IX D on 10/16, no OR cultures sent and not discharged home on antibiotics. Initially doing well after being discharged but again started to have fevers, chills, erythema, pain and drainage from incision site. He was readmitted on 10/24 with temp of 101F x 2 days, pain to stump, drainage and found to have leukocytosis of 14K. We was started again on vancomycin and piptazo. Blood cx ngtd. Evaluated by Dr. Sharol Given who felt no need for further surgery. He still is having sanginous purulent drainage from his leg.   Past Medical History  Diagnosis Date  . Onychomycosis   . Hypertension   . Diabetic peripheral neuropathy   . Diabetic foot ulcer     s/p Right first ray amputation, left transmetatarsal amputation with revision  . History of drug abuse     Cocaine and marijuana  . Exposure to trichomonas     treated empirically  . Cellulitis of left foot 03/2008    left 4th and 5th metatarsal area  . Hyperlipidemia   . 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 Mallory [name] tear April 2009  . History of prolonged Q-T interval on ECG   . 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  . 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 CKD (chronic kidney disease) stage 2, GFR  60-89 ml/min     BL SCr 1.3-1.4  . Depression   . Peripheral vascular disease   . Type 2 diabetes mellitus, uncontrolled, with renal complications   . GERD (gastroesophageal reflux disease)   . 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 enoxaparin (LOVENOX) injection  40 mg Subcutaneous Q24H  . ferrous sulfate  325 mg Oral TID WC  . FLUoxetine  10 mg Oral Daily  . gabapentin  600 mg Oral TID  . insulin aspart  0-5 Units Subcutaneous QHS  . insulin aspart  0-9 Units Subcutaneous TID WC  . insulin aspart  4 Units Subcutaneous TID WC  . insulin glargine  32 Units Subcutaneous QHS  . lisinopril  10 mg Oral Daily  . piperacillin-tazobactam (ZOSYN)  IV  3.375 g Intravenous Q8H  . polyethylene glycol  17 g Oral Daily  . senna-docusate  1 tablet Oral QHS  . sodium chloride  3 mL Intravenous Q12H  .  vancomycin  1,000 mg Intravenous Q12H    History  Substance Use Topics  . Smoking status: Current Every Day Smoker -- 0.50 packs/day for 34 years    Types: Cigarettes  . Smokeless tobacco: Never Used     Comment: 1/2 PPD  . Alcohol Use: Yes     Comment: occ    Family History  Problem Relation Age of Onset  . Diabetes type II Mother   . Pancreatic cancer Father      Review of Systems  Constitutional: positive for fever, chills, but no diaphoresis, activity change, appetite change, fatigue and unexpected weight change.  HENT: Negative for congestion, sore throat, rhinorrhea, sneezing, trouble swallowing and sinus pressure.  Eyes: Negative for photophobia and visual disturbance.  Respiratory: Negative for cough, chest tightness, shortness of breath, wheezing and stridor.  Cardiovascular: Negative for chest  pain, palpitations and leg swelling.  Gastrointestinal: Negative for nausea, vomiting, abdominal pain, diarrhea, constipation, blood in stool, abdominal distention and anal bleeding.  Genitourinary: Negative for dysuria, hematuria, flank pain and difficulty urinating.  Musculoskeletal: Negative for myalgias, back pain, joint swelling, arthralgias and gait problem.  Skin: lredness and purulent driange to left bka Neurological: Negative for dizziness, tremors, weakness and light-headedness.  Hematological: Negative for adenopathy. Does not bruise/bleed easily.  Psychiatric/Behavioral: Negative for behavioral problems, confusion, sleep disturbance, dysphoric mood, decreased concentration and agitation.     OBJECTIVE: Temp:  [98.7 F (37.1 C)-100.2 F (37.9 C)] 98.7 F (37.1 C) (10/27 0630) Pulse Rate:  [106-110] 109 (10/27 0630) Resp:  [18] 18 (10/27 0630) BP: (156-179)/(87-96) 179/90 mmHg (10/27 0630) SpO2:  [98 %-100 %] 100 % (10/27 0630) .Physical Exam  Constitutional: He is oriented to person, place, and time. He appears well-developed and well-nourished. No distress.  HENT:  Mouth/Throat: Oropharynx is clear and moist. No oropharyngeal exudate.  Cardiovascular: Normal rate, regular rhythm and normal heart sounds. Exam reveals no gallop and no friction rub.  No murmur heard.  Pulmonary/Chest: Effort normal and breath sounds normal. No respiratory distress. He has no wheezes.  Abdominal: Soft. Bowel sounds are normal. He exhibits no distension. There is no tenderness.  Lymphadenopathy:  He has no cervical adenopathy.  Neurological: He is alert and oriented to person, place, and time.  Skin: Skin is warm and dry. No rash noted. No erythema.  Psychiatric: He has a normal mood and affect. His behavior is normal.  Ext: right BKA well healed. Left bka appears to have purulent sanginous drainage from middle of surgical incision. Increased drainage when inferior aspect of leg is  compressed.  LABS: Results for orders placed during the hospital encounter of 01/31/14 (from the past 48 hour(s))  GLUCOSE, CAPILLARY     Status: Abnormal   Collection Time    02/01/14 12:27 PM      Result Value Ref Range   Glucose-Capillary 231 (*) 70 - 99 mg/dL  GLUCOSE, CAPILLARY     Status: Abnormal   Collection Time    02/01/14  4:49 PM      Result Value Ref Range   Glucose-Capillary 258 (*) 70 - 99 mg/dL  GLUCOSE, CAPILLARY     Status: Abnormal   Collection Time    02/01/14  9:30 PM      Result Value Ref Range   Glucose-Capillary 206 (*) 70 - 99 mg/dL   Comment 1 Notify RN    GLUCOSE, CAPILLARY     Status: Abnormal   Collection Time    02/02/14  7:49 AM  Result Value Ref Range   Glucose-Capillary 146 (*) 70 - 99 mg/dL  GLUCOSE, CAPILLARY     Status: Abnormal   Collection Time    02/02/14 11:52 AM      Result Value Ref Range   Glucose-Capillary 220 (*) 70 - 99 mg/dL  GLUCOSE, CAPILLARY     Status: Abnormal   Collection Time    02/02/14  4:47 PM      Result Value Ref Range   Glucose-Capillary 218 (*) 70 - 99 mg/dL  VITAMIN B12     Status: None   Collection Time    02/02/14  5:05 PM      Result Value Ref Range   Vitamin B-12 395  211 - 911 pg/mL   Comment: Performed at [facility]     Status: None   Collection Time    02/02/14  5:05 PM      Result Value Ref Range   Folate 4.8     Comment: (NOTE)     Reference Ranges            Deficient:       0.4 - 3.3 ng/mL            Indeterminate:   3.4 - 5.4 ng/mL            Normal:              > 5.4 ng/mL     Performed at [facility]     Status: Abnormal   Collection Time    02/02/14  5:05 PM      Result Value Ref Range   Iron <10 (*) 42 - 135 ug/dL   TIBC Not calculated due to Iron <10.  215 - 435 ug/dL   Saturation Ratios Not calculated due to Iron <10.  20 - 55 %   UIBC 157  125 - 400 ug/dL   Comment: Performed at [facility]     Status: None    Collection Time    02/02/14  5:05 PM      Result Value Ref Range   Ferritin 151  22 - 322 ng/mL   Comment: Performed at [facility]     Status: Abnormal   Collection Time    02/02/14  5:05 PM      Result Value Ref Range   Retic Ct Pct 1.4  0.4 - 3.1 %   RBC. 2.60 (*) 4.22 - 5.81 MIL/uL   Retic Count, Manual 36.4  19.0 - 186.0 K/uL  GLUCOSE, CAPILLARY     Status: Abnormal   Collection Time    02/02/14  9:25 PM      Result Value Ref Range   Glucose-Capillary 177 (*) 70 - 99 mg/dL  CBC     Status: Abnormal   Collection Time    02/03/14  6:55 AM      Result Value Ref Range   WBC 18.6 (*) 4.0 - 10.5 K/uL   RBC 2.72 (*) 4.22 - 5.81 MIL/uL   Hemoglobin 7.7 (*) 13.0 - 17.0 g/dL   HCT 24.3 (*) 39.0 - 52.0 %   MCV 89.3  78.0 - 100.0 fL   MCH 28.3  26.0 - 34.0 pg   MCHC 31.7  30.0 - 36.0 g/dL   RDW 13.5  11.5 - 15.5 %   Platelets 687 (*) 150 - 400 K/uL  BASIC METABOLIC PANEL  Status: Abnormal   Collection Time    02/03/14  6:55 AM      Result Value Ref Range   Sodium 134 (*) 137 - 147 mEq/L   Potassium 4.7  3.7 - 5.3 mEq/L   Chloride 96  96 - 112 mEq/L   CO2 22  19 - 32 mEq/L   Glucose, Bld 320 (*) 70 - 99 mg/dL   BUN 21  6 - 23 mg/dL   Creatinine, Ser 1.65 (*) 0.50 - 1.35 mg/dL   Calcium 9.0  8.4 - 10.5 mg/dL   GFR calc non Af Amer 48 (*) >90 mL/min   GFR calc Af Amer 55 (*) >90 mL/min   Comment: (NOTE)     The eGFR has been calculated using the CKD EPI equation.     This calculation has not been validated in all clinical situations.     eGFR's persistently <90 mL/min signify possible Chronic Kidney     Disease.   Anion gap 16 (*) 5 - 15  GLUCOSE, CAPILLARY     Status: Abnormal   Collection Time    02/03/14  7:33 AM      Result Value Ref Range   Glucose-Capillary 289 (*) 70 - 99 mg/dL    MICRO: 10/24 blood cx ngtd  IMAGING: No results found.  HISTORICAL MICRO/IMAGING  Assessment/Plan: presumed deep tissue infection from poor healing  from recent bka of left leg.  - would get further imaging such as leg CT to look if there is deep wound infection that needs i x d - Check sed rate and crp - continue on broad spectrum antibiotics - wound swab has been sent for culture - likely need Iv antibiotics for 2-4 wk and transitioned to orals until his wound heals - await culture results for final recs  Florina Glas B. [facility] [phone]

## 2014-02-04 DIAGNOSIS — Z89512 Acquired absence of left leg below knee: Secondary | ICD-10-CM

## 2014-02-04 DIAGNOSIS — I509 Heart failure, unspecified: Secondary | ICD-10-CM

## 2014-02-04 DIAGNOSIS — E1165 Type 2 diabetes mellitus with hyperglycemia: Secondary | ICD-10-CM

## 2014-02-04 DIAGNOSIS — D509 Iron deficiency anemia, unspecified: Secondary | ICD-10-CM | POA: Diagnosis present

## 2014-02-04 DIAGNOSIS — D473 Essential (hemorrhagic) thrombocythemia: Secondary | ICD-10-CM

## 2014-02-04 DIAGNOSIS — I10 Essential (primary) hypertension: Secondary | ICD-10-CM

## 2014-02-04 DIAGNOSIS — Z720 Tobacco use: Secondary | ICD-10-CM

## 2014-02-04 DIAGNOSIS — N182 Chronic kidney disease, stage 2 (mild): Secondary | ICD-10-CM

## 2014-02-04 DIAGNOSIS — E118 Type 2 diabetes mellitus with unspecified complications: Secondary | ICD-10-CM

## 2014-02-04 DIAGNOSIS — T8744 Infection of amputation stump, left lower extremity: Secondary | ICD-10-CM

## 2014-02-04 LAB — CBC WITH DIFFERENTIAL/PLATELET
BASOS ABS: 0 10*3/uL (ref 0.0–0.1)
Basophils Absolute: 0 10*3/uL (ref 0.0–0.1)
Basophils Relative: 0 % (ref 0–1)
Basophils Relative: 0 % (ref 0–1)
EOS ABS: 0.3 10*3/uL (ref 0.0–0.7)
Eosinophils Absolute: 0.3 10*3/uL (ref 0.0–0.7)
Eosinophils Relative: 2 % (ref 0–5)
Eosinophils Relative: 2 % (ref 0–5)
HEMATOCRIT: 22.5 % — AB (ref 39.0–52.0)
HEMATOCRIT: 23.1 % — AB (ref 39.0–52.0)
HEMOGLOBIN: 7.5 g/dL — AB (ref 13.0–17.0)
Hemoglobin: 7.3 g/dL — ABNORMAL LOW (ref 13.0–17.0)
LYMPHS ABS: 2.2 10*3/uL (ref 0.7–4.0)
Lymphocytes Relative: 13 % (ref 12–46)
Lymphocytes Relative: 16 % (ref 12–46)
Lymphs Abs: 1.7 10*3/uL (ref 0.7–4.0)
MCH: 29 pg (ref 26.0–34.0)
MCH: 28.4 pg (ref 26.0–34.0)
MCHC: 32.4 g/dL (ref 30.0–36.0)
MCHC: 32.5 g/dL (ref 30.0–36.0)
MCV: 89.3 fL (ref 78.0–100.0)
MCV: 87.5 fL (ref 78.0–100.0)
MONO ABS: 1.3 10*3/uL — AB (ref 0.1–1.0)
MONO ABS: 1 10*3/uL (ref 0.1–1.0)
Monocytes Relative: 10 % (ref 3–12)
Monocytes Relative: 8 % (ref 3–12)
NEUTROS ABS: 9.9 10*3/uL — AB (ref 1.7–7.7)
Neutro Abs: 9.8 10*3/uL — ABNORMAL HIGH (ref 1.7–7.7)
Neutrophils Relative %: 75 % (ref 43–77)
Neutrophils Relative %: 74 % (ref 43–77)
PLATELETS: 653 10*3/uL — AB (ref 150–400)
Platelets: 749 10*3/uL — ABNORMAL HIGH (ref 150–400)
RBC: 2.52 MIL/uL — ABNORMAL LOW (ref 4.22–5.81)
RBC: 2.64 MIL/uL — ABNORMAL LOW (ref 4.22–5.81)
RDW: 13.6 % (ref 11.5–15.5)
RDW: 13.5 % (ref 11.5–15.5)
WBC: 13.2 10*3/uL — ABNORMAL HIGH (ref 4.0–10.5)
WBC: 13.5 10*3/uL — AB (ref 4.0–10.5)

## 2014-02-04 LAB — BASIC METABOLIC PANEL
Anion gap: 12 (ref 5–15)
BUN: 18 mg/dL (ref 6–23)
CALCIUM: 8.9 mg/dL (ref 8.4–10.5)
CO2: 23 meq/L (ref 19–32)
Chloride: 99 mEq/L (ref 96–112)
Creatinine, Ser: 1.61 mg/dL — ABNORMAL HIGH (ref 0.50–1.35)
GFR calc Af Amer: 57 mL/min — ABNORMAL LOW (ref >90)
GFR, EST NON AFRICAN AMERICAN: 49 mL/min — AB (ref >90)
GLUCOSE: 226 mg/dL — AB (ref 70–99)
Potassium: 4.5 mEq/L (ref 3.7–5.3)
Sodium: 134 mEq/L — ABNORMAL LOW (ref 137–147)

## 2014-02-04 LAB — GLUCOSE, CAPILLARY
Glucose-Capillary: 178 mg/dL — ABNORMAL HIGH (ref 70–99)
Glucose-Capillary: 219 mg/dL — ABNORMAL HIGH (ref 70–99)
Glucose-Capillary: 170 mg/dL — ABNORMAL HIGH (ref 70–99)
Glucose-Capillary: 196 mg/dL — ABNORMAL HIGH (ref 70–99)

## 2014-02-04 MED ORDER — INSULIN GLARGINE 100 UNIT/ML ~~LOC~~ SOLN
36.0000 [IU] | Freq: Every day | SUBCUTANEOUS | Status: DC
  Administered 2014-02-04 – 2014-02-08 (×5): 36 [IU] via SUBCUTANEOUS
  Filled 2014-02-04 (×6): qty 0.36

## 2014-02-04 MED ORDER — INSULIN ASPART 100 UNIT/ML ~~LOC~~ SOLN
5.0000 [IU] | Freq: Three times a day (TID) | SUBCUTANEOUS | Status: DC
  Administered 2014-02-04 – 2014-02-06 (×5): 5 [IU] via SUBCUTANEOUS

## 2014-02-04 NOTE — Progress Notes (Signed)
Inpatient Diabetes Program Recommendations  AACE/ADA: New Consensus Statement on Inpatient Glycemic Control (2013)  Target Ranges:  Prepandial:   less than 140 mg/dL      Peak postprandial:   less than 180 mg/dL (1-2 hours)      Critically ill patients:  140 - 180 mg/dL    Results for LAQUINTON, BIHM (MRN [id number]) as of 02/04/2014 12:48  Ref. Range 02/03/2014 07:33 02/03/2014 12:00 02/03/2014 17:23 02/03/2014 21:16  Glucose-Capillary Latest Range: 70-99 mg/dL 289 (H) 294 (H) 257 (H) 209 (H)   Results for CODIE, KROGH (MRN [id number]) as of 02/04/2014 12:48  Ref. Range 02/04/2014 07:57 02/04/2014 11:50  Glucose-Capillary Latest Range: 70-99 mg/dL 178 (H) 219 (H)      MD- Post prandial glucose consistently elevated despite Novolog 4 units with meals.   Please consider increasing meal coverage to Novolog 6 units TID with meals   Will follow Wyn Quaker RN, MSN, CDE Diabetes Coordinator Inpatient Diabetes Program Team Pager: [phone] (8a-10p)

## 2014-02-04 NOTE — Progress Notes (Signed)
Subjective: Pt has no complaints today, states everything is good.   Objective: Vital signs in last 24 hours: Filed Vitals:   02/03/14 0630 02/03/14 1316 02/03/14 2117 02/04/14 0549  BP: 179/90 140/77 140/92 164/94  Pulse: 109 105 104 101  Temp: 98.7 F (37.1 C) 98.6 F (37 C) 98.7 F (37.1 C) 98.5 F (36.9 C)  TempSrc: Oral Oral Oral Oral  Resp: 18 16 18 18   Height:      Weight:      SpO2: 100% 100% 100% 100%   Weight change:   Intake/Output Summary (Last 24 hours) at 02/04/14 0735 Last data filed at 02/04/14 0541  Gross per 24 hour  Intake    600 ml  Output   1350 ml  Net   -750 ml   General Appearance:  Alert, cooperative, no distress, appears stated age   Head:  Normocephalic, without obvious abnormality, atraumatic   Eyes:   conjunctiva/corneas clear, EOM's intact   Lungs:  respirations unlabored , no accessory muscle use, CTAB  Heart:  Regular rate and rhythm, S1 and S2 normal, no murmur, rub or gallop   Abdomen:  Soft, non-tender, bowel sounds active   Extremities:  B/l BKA, lt bka sutures intact, serosanguinous fluid on dressing beneath lt stump, no active oozing or pus at wound site, tender to palpation of lt stump  Neurologic:  CNII-XII grossly intact.       Lab Results: Basic Metabolic Panel:  Recent Labs Lab 02/01/14 0608 02/03/14 0655  NA 136* 134*  K 4.0 4.7  CL 98 96  CO2 24 22  GLUCOSE 200* 320*  BUN 12 21  CREATININE 1.45* 1.65*  CALCIUM 8.7 9.0   CBC:  Recent Labs Lab 01/31/14 0957  02/03/14 0655 02/04/14 0653  WBC 14.1*  < > 18.6* 13.2*  NEUTROABS 11.2*  --   --  9.8*  HGB 8.1*  < > 7.7* 7.3*  HCT 24.5*  < > 24.3* 22.5*  MCV 87.5  < > 89.3 89.3  PLT 543*  < > 687* 653*  < > = values in this interval not displayed. CBG:  Recent Labs Lab 02/02/14 1647 02/02/14 2125 02/03/14 0733 02/03/14 1200 02/03/14 1723 02/03/14 2116  GLUCAP 218* 177* 289* 294* 257* 209*   Urinalysis:  Recent Labs Lab 01/31/14 1445    COLORURINE YELLOW  LABSPEC 1.027  PHURINE 6.0  GLUCOSEU >1000*  HGBUR TRACE*  BILIRUBINUR NEGATIVE  KETONESUR NEGATIVE  PROTEINUR 100*  UROBILINOGEN 1.0  NITRITE NEGATIVE  LEUKOCYTESUR NEGATIVE    Studies/Results: Ct Knee Left Wo Contrast  02/03/2014   CLINICAL DATA:  Left lower extremity amputation. A clinical concern for infection.  EXAM: CT OF THE LEFT KNEE WITHOUT CONTRAST  TECHNIQUE: Multidetector CT imaging of the LEFT knee was performed according to the standard protocol. Multiplanar CT image reconstructions were also generated.  COMPARISON:  None.  FINDINGS: The patient is status post revision amputation on 01/23/2014.  There is below-the-knee amputation. There is a complex fluid collection measuring approximately 7 x 3 x 4 cm at the distal stump with multiple foci of air within the collection most concerning for an abscess. The abscess abuts the distal tibial stump. There is no cortical erosion. There is a medullary defect of the distal tibial diaphysis which may be postsurgical versus extension of infection.  There is no other focal fluid collection. There is no fracture or dislocation. There is a small joint effusion.  IMPRESSION: 1. Left below-the-knee amputation. Complex fluid  collection measuring 7 x 3 x 4 cm at the distal stump with multiple foci of air within the collection most concerning for an abscess which abuts the distal tibial stump. No definite cortical erosion. Medullary defect of the central distal tibial diaphysis which may be postsurgical versus extension of infection.   Electronically Signed   By: Kathreen Devoid   On: 02/03/2014 13:41   Medications: I have reviewed the patient's current medications. Scheduled Meds: . enoxaparin (LOVENOX) injection  40 mg Subcutaneous Q24H  . ferrous sulfate  325 mg Oral TID WC  . FLUoxetine  10 mg Oral Daily  . gabapentin  600 mg Oral TID  . insulin aspart  0-5 Units Subcutaneous QHS  . insulin aspart  0-9 Units Subcutaneous TID  WC  . insulin aspart  4 Units Subcutaneous TID WC  . insulin glargine  32 Units Subcutaneous QHS  . lisinopril  10 mg Oral Daily  . piperacillin-tazobactam (ZOSYN)  IV  3.375 g Intravenous Q8H  . polyethylene glycol  17 g Oral Daily  . senna-docusate  1 tablet Oral QHS  . sodium chloride  3 mL Intravenous Q12H  . vancomycin  1,000 mg Intravenous Q12H   Continuous Infusions:  PRN Meds:.cyclobenzaprine, docusate sodium, oxyCODONE-acetaminophen, pantoprazole, promethazine Assessment/Plan: Active Problems:   Cellulitis and abscess of leg   Cellulitis of left BKA  - no indication for sx, continue IV abx per Dr. Sharol Given - day 5 of IV vanc and zosyn, wound culture pending which will guide length of tx (2-4 weeks per ID) - WBCs trending down,13.2 today vs 18.6 yesterday. pt afebrile but still tachycardic in the low 100's. - CT of left knee reveals abscess with post sx medullary defect of tibia vs extension of infxn, Dr. Sharol Given aware and believes this is most likely post sx changes due to recent lt stump revision on 10/16. - blood cultures x 2 NGTD - percocet q4h prn for pain control  - flexeril 5mg  TID prn for muscle cramping  Diabetes mellitus type 2, uncontrolled, with complications --Patient is prone to hypoglycemia per previous documentation. A1c 9.6 on 10/14. CBGs stable - Lantus 32 u daily  - Continue Mealtime 4u  - Continue SSI-S  -Continue home gabapentin 600 mg TID.   Anemia--likely anemia of chronic disease ( Fe low, TIBC low, ferritin 151) - Ferrous sulfate 325 TID - colace prn for constipation  Thrombocytosis-- likely reactive 2/2 to wound infxn.  - will continue to monitor, trending down  Essential hypertension- elevated BPs overnight, will consider adding a thiazide.  - continue Lisinopril 10mg    CKD Stage 2-- Cr 1.45 10/25 improved from baseline of 1.51.  -Cr 1.65 yesterday, today 1.61, will continue to monitor  DVT prophylaxis- lovenox   Diet- carb modified  Dispo:  Disposition is deferred at this time, awaiting improvement of current medical problems.   The patient does have a current PCP [name], MD) and does need an [facility] hospital follow-up appointment after discharge.   The patient does not have transportation limitations that hinder transportation to clinic appointments. .Services Needed at time of discharge: Y = Yes, Blank = No PT:   OT:   RN:   Equipment:   Other:     LOS: 4 days   Julious Oka, MD 02/04/2014, 7:35 AM

## 2014-02-04 NOTE — Progress Notes (Signed)
Subjective: Pt has no complaints. Apparently does not have electricity at home which has prevented home health from seeing him in the past. Also does not have running water and gets water from his neighbor which he transports to his house via a bucket. He states bucket is clean.   Objective: Vital signs in last 24 hours: Filed Vitals:   02/03/14 2117 02/04/14 0549 02/04/14 1447 02/04/14 1601  BP: 140/92 164/94 188/91 127/81  Pulse: 104 101 109 104  Temp: 98.7 F (37.1 C) 98.5 F (36.9 C) 99 F (37.2 C)   TempSrc: Oral Oral Oral   Resp: 18 18 18    Height:      Weight:      SpO2: 100% 100% 100%    Weight change:   Intake/Output Summary (Last 24 hours) at 02/04/14 1908 Last data filed at 02/04/14 1833  Gross per 24 hour  Intake    930 ml  Output   2395 ml  Net  -1465 ml   General Appearance:  Alert, cooperative, no distress, appears stated age   Head:  Normocephalic, without obvious abnormality, atraumatic   Eyes:   conjunctiva/corneas clear, EOM's intact   Lungs:  respirations unlabored , no accessory muscle use, CTAB  Heart:  Regular rate and rhythm, S1 and S2 normal, no murmur, rub or gallop   Abdomen:  Soft, non-tender, bowel sounds active   Extremities:  B/l BKA, lt bka stump wrapped w gauze, dressing dry  Neurologic:  CNII-XII grossly intact.       Lab Results: Basic Metabolic Panel:  Recent Labs Lab 02/03/14 0655 02/04/14 0653  NA 134* 134*  K 4.7 4.5  CL 96 99  CO2 22 23  GLUCOSE 320* 226*  BUN 21 18  CREATININE 1.65* 1.61*  CALCIUM 9.0 8.9   CBC:  Recent Labs Lab 01/31/14 0957  02/03/14 0655 02/04/14 0653  WBC 14.1*  < > 18.6* 13.2*  NEUTROABS 11.2*  --   --  9.8*  HGB 8.1*  < > 7.7* 7.3*  HCT 24.5*  < > 24.3* 22.5*  MCV 87.5  < > 89.3 89.3  PLT 543*  < > 687* 653*  < > = values in this interval not displayed. CBG:  Recent Labs Lab 02/03/14 1200 02/03/14 1723 02/03/14 2116 02/04/14 0757 02/04/14 1150 02/04/14 1703  GLUCAP 294*  257* 209* 178* 219* 170*    Studies/Results: Ct Knee Left Wo Contrast  02/03/2014   CLINICAL DATA:  Left lower extremity amputation. A clinical concern for infection.  EXAM: CT OF THE LEFT KNEE WITHOUT CONTRAST  TECHNIQUE: Multidetector CT imaging of the LEFT knee was performed according to the standard protocol. Multiplanar CT image reconstructions were also generated.  COMPARISON:  None.  FINDINGS: The patient is status post revision amputation on 01/23/2014.  There is below-the-knee amputation. There is a complex fluid collection measuring approximately 7 x 3 x 4 cm at the distal stump with multiple foci of air within the collection most concerning for an abscess. The abscess abuts the distal tibial stump. There is no cortical erosion. There is a medullary defect of the distal tibial diaphysis which may be postsurgical versus extension of infection.  There is no other focal fluid collection. There is no fracture or dislocation. There is a small joint effusion.  IMPRESSION: 1. Left below-the-knee amputation. Complex fluid collection measuring 7 x 3 x 4 cm at the distal stump with multiple foci of air within the collection most concerning for an abscess  which abuts the distal tibial stump. No definite cortical erosion. Medullary defect of the central distal tibial diaphysis which may be postsurgical versus extension of infection.   Electronically Signed   By: Kathreen Devoid   On: 02/03/2014 13:41   Medications: I have reviewed the patient's current medications. Scheduled Meds: . enoxaparin (LOVENOX) injection  40 mg Subcutaneous Q24H  . ferrous sulfate  325 mg Oral TID WC  . FLUoxetine  10 mg Oral Daily  . gabapentin  600 mg Oral TID  . insulin aspart  0-5 Units Subcutaneous QHS  . insulin aspart  0-9 Units Subcutaneous TID WC  . insulin aspart  5 Units Subcutaneous TID WC  . insulin glargine  36 Units Subcutaneous QHS  . lisinopril  10 mg Oral Daily  . piperacillin-tazobactam (ZOSYN)  IV  3.375 g  Intravenous Q8H  . polyethylene glycol  17 g Oral Daily  . senna-docusate  1 tablet Oral QHS  . sodium chloride  3 mL Intravenous Q12H   Continuous Infusions:  PRN Meds:.cyclobenzaprine, docusate sodium, oxyCODONE-acetaminophen, pantoprazole, promethazine Assessment/Plan: Principal Problem:   Cellulitis and abscess of leg Active Problems:   Diabetes mellitus type 2, uncontrolled, with complications   TOBACCO ABUSE   Essential hypertension   CKD Stage 2   History of left below knee amputation   Anemia, iron deficiency   Cellulitis of left BKA - CT of left knee reveals abscess with post sx medullary defect of tibia vs extension of infxn, Dr. Sharol Given aware and believes this is most likely post sx changes due to recent lt stump revision on 10/16. - no indication for sx, beside debridement and abscess wicked open by Dr. Sharol Given this morning.  - daily hydrotherapy, Dr. Sharol Given to see patient Monday. will likely need SNF placement for wound care - IV zosyn transitioned to po cipro 750mg  BID after wound culture revealed E.coli that was pansensitive  - WBCs trending down,12.5 today vs 13.5 yesterday.  - blood cultures x 2 NGTD - percocet q4h prn for pain control  - flexeril 5mg  TID prn for muscle cramping  Diabetes mellitus type 2, uncontrolled, with complications --Patient is prone to hypoglycemia per previous documentation. A1c 9.6 on 10/14. CBGs elevated but stable, increased below insulin dosages. Pt reports feeling hypoglycemia at CBGs in the high 100's - 200's - Lantus 36 u daily  - Continue Mealtime 5u  - Continue SSI-S  -Continue home gabapentin 600 mg TID.   Fe deficiency anemia--likely anemia of chronic disease ( Fe low, TIBC low, ferritin 151) - Ferrous sulfate 325 TID - colace prn for constipation  Thrombocytosis-- likely reactive 2/2 to wound infxn.  - will continue to monitor  Essential hypertension- normotensive - continue Lisinopril 10mg    CKD Stage 2-- Cr 1.45 10/25  improved from baseline of 1.51.  -Cr 1.61 yesterday, today 1.80, will continue to monitor  DVT prophylaxis- lovenox   Diet- carb modified  Dispo: Disposition is deferred at this time, awaiting improvement of current medical problems.   The patient does have a current PCP [name], MD) and does need an [facility] hospital follow-up appointment after discharge.   The patient does not have transportation limitations that hinder transportation to clinic appointments. .Services Needed at time of discharge: Y = Yes, Blank = No PT:   OT:   RN:   Equipment:   Other:     LOS: 4 days   Julious Oka, MD 02/04/2014, 7:08 PM

## 2014-02-04 NOTE — Progress Notes (Addendum)
  Date: 02/04/2014  Patient name: [name]  Medical record number: [id number]  Date of birth: 1965-12-04   This patient has been seen and the plan of care was discussed with the house staff. Please see their note for complete details. I concur with their findings with the following additions/corrections:  D/C vancomycin.  Wound care consult.   Would increase meal coverage to 5 units of insulin with meals for better control.   Sid Falcon, MD 02/04/2014, 3:02 PM

## 2014-02-04 NOTE — Progress Notes (Addendum)
[facility] for Infectious Disease    Date of Admission:  01/31/2014   Total days of antibiotics 5        Day 5 vanco        Day 5 piptazo           ID: [name] is a 48 y.o. male with left bka on 9/4 complicated by wound drainage s/p revision on 10/14 now readmitted on 10/21 for SIRS, drainage from surgical site and erythema to L BKA.  Principal Problem:   Cellulitis and abscess of leg Active Problems:   Diabetes mellitus type 2, uncontrolled, with complications   TOBACCO ABUSE   Essential hypertension   CKD Stage 2   History of left below knee amputation   Anemia, iron deficiency    Subjective: Leukocytosis improved afebrile. Wound still draining. Underwent CT that showed fluid collection  Medications:  . enoxaparin (LOVENOX) injection  40 mg Subcutaneous Q24H  . ferrous sulfate  325 mg Oral TID WC  . FLUoxetine  10 mg Oral Daily  . gabapentin  600 mg Oral TID  . insulin aspart  0-5 Units Subcutaneous QHS  . insulin aspart  0-9 Units Subcutaneous TID WC  . insulin aspart  4 Units Subcutaneous TID WC  . insulin glargine  32 Units Subcutaneous QHS  . lisinopril  10 mg Oral Daily  . piperacillin-tazobactam (ZOSYN)  IV  3.375 g Intravenous Q8H  . polyethylene glycol  17 g Oral Daily  . senna-docusate  1 tablet Oral QHS  . sodium chloride  3 mL Intravenous Q12H  . vancomycin  1,000 mg Intravenous Q12H    Objective: Vital signs in last 24 hours: Temp:  [98.5 F (36.9 C)-98.7 F (37.1 C)] 98.5 F (36.9 C) (10/28 0549) Pulse Rate:  [101-105] 101 (10/28 0549) Resp:  [16-18] 18 (10/28 0549) BP: (140-164)/(77-94) 164/94 mmHg (10/28 0549) SpO2:  [100 %] 100 % (10/28 0549)     Lab Results  Recent Labs  02/03/14 0655 02/04/14 0653  WBC 18.6* 13.2*  HGB 7.7* 7.3*  HCT 24.3* 22.5*  NA 134* 134*  K 4.7 4.5  CL 96 99  CO2 22 23  BUN 21 18  CREATININE 1.65* 1.61*   Liver Panel No results found for this basename: PROT, ALBUMIN, AST, ALT, ALKPHOS,  BILITOT, BILIDIR, IBILI,  in the last 72 hours Lab Results  Component Value Date   ESRSEDRATE 131* 01/31/2014   Lab Results  Component Value Date   CRP 25.4* 01/31/2014    Microbiology: Wound cx from 10/21: GNR Studies/Results: Ct Knee Left Wo Contrast  02/03/2014   CLINICAL DATA:  Left lower extremity amputation. A clinical concern for infection.  EXAM: CT OF THE LEFT KNEE WITHOUT CONTRAST  TECHNIQUE: Multidetector CT imaging of the LEFT knee was performed according to the standard protocol. Multiplanar CT image reconstructions were also generated.  COMPARISON:  None.  FINDINGS: The patient is status post revision amputation on 01/23/2014.  There is below-the-knee amputation. There is a complex fluid collection measuring approximately 7 x 3 x 4 cm at the distal stump with multiple foci of air within the collection most concerning for an abscess. The abscess abuts the distal tibial stump. There is no cortical erosion. There is a medullary defect of the distal tibial diaphysis which may be postsurgical versus extension of infection.  There is no other focal fluid collection. There is no fracture or dislocation. There is a small joint effusion.  IMPRESSION: 1. Left  below-the-knee amputation. Complex fluid collection measuring 7 x 3 x 4 cm at the distal stump with multiple foci of air within the collection most concerning for an abscess which abuts the distal tibial stump. No definite cortical erosion. Medullary defect of the central distal tibial diaphysis which may be postsurgical versus extension of infection.   Electronically Signed   By: Kathreen Devoid   On: 02/03/2014 13:41     Assessment/Plan: Post surgical wound infection = purulent drainage from his recent revision of L BKA is consistent with deep tissue infection. Wound cx growing GNR. Recommend to continue on piptazo, will discontinue vancomycin. Please ask dr. Sharol Given if he needs to do bedside debridement or ask wound care how they would  recommend dressing wound. If the purulent material reaccumulates, i suspect that he may need that area of his stumped flushed/ packed with iodine strip to provide wicking to the area so that purulent drainage has outlet.   Await id on GNR to see if can give oral vs. IV antibiotics. Hold on picc line for now  [facility], [facility] for Infectious Diseases Cell: [phone] Pager: [phone]  02/04/2014, 12:21 PM

## 2014-02-05 ENCOUNTER — Ambulatory Visit: Payer: Medicaid Other | Admitting: Internal Medicine

## 2014-02-05 LAB — CBC WITH DIFFERENTIAL/PLATELET
BASOS ABS: 0 10*3/uL (ref 0.0–0.1)
Basophils Relative: 0 % (ref 0–1)
EOS PCT: 3 % (ref 0–5)
Eosinophils Absolute: 0.3 10*3/uL (ref 0.0–0.7)
HEMATOCRIT: 23.1 % — AB (ref 39.0–52.0)
Hemoglobin: 7.4 g/dL — ABNORMAL LOW (ref 13.0–17.0)
LYMPHS ABS: 2.1 10*3/uL (ref 0.7–4.0)
LYMPHS PCT: 17 % (ref 12–46)
MCH: 28.9 pg (ref 26.0–34.0)
MCHC: 32 g/dL (ref 30.0–36.0)
MCV: 90.2 fL (ref 78.0–100.0)
Monocytes Absolute: 1.1 10*3/uL — ABNORMAL HIGH (ref 0.1–1.0)
Monocytes Relative: 9 % (ref 3–12)
NEUTROS ABS: 9 10*3/uL — AB (ref 1.7–7.7)
Neutrophils Relative %: 71 % (ref 43–77)
Platelets: 737 10*3/uL — ABNORMAL HIGH (ref 150–400)
RBC: 2.56 MIL/uL — AB (ref 4.22–5.81)
RDW: 13.6 % (ref 11.5–15.5)
WBC: 12.5 10*3/uL — AB (ref 4.0–10.5)

## 2014-02-05 LAB — BASIC METABOLIC PANEL
Anion gap: 13 (ref 5–15)
BUN: 19 mg/dL (ref 6–23)
CO2: 26 meq/L (ref 19–32)
CREATININE: 1.8 mg/dL — AB (ref 0.50–1.35)
Calcium: 9.2 mg/dL (ref 8.4–10.5)
Chloride: 101 mEq/L (ref 96–112)
GFR calc Af Amer: 50 mL/min — ABNORMAL LOW (ref >90)
GFR calc non Af Amer: 43 mL/min — ABNORMAL LOW (ref >90)
Glucose, Bld: 195 mg/dL — ABNORMAL HIGH (ref 70–99)
Potassium: 4.7 mEq/L (ref 3.7–5.3)
Sodium: 140 mEq/L (ref 137–147)

## 2014-02-05 LAB — GLUCOSE, CAPILLARY
GLUCOSE-CAPILLARY: 153 mg/dL — AB (ref 70–99)
GLUCOSE-CAPILLARY: 194 mg/dL — AB (ref 70–99)
Glucose-Capillary: 149 mg/dL — ABNORMAL HIGH (ref 70–99)
Glucose-Capillary: 144 mg/dL — ABNORMAL HIGH (ref 70–99)

## 2014-02-05 LAB — WOUND CULTURE: Special Requests: NORMAL

## 2014-02-05 LAB — C-REACTIVE PROTEIN: CRP: 11.6 mg/dL — ABNORMAL HIGH (ref <0.60)

## 2014-02-05 LAB — SEDIMENTATION RATE: Sed Rate: 138 mm/hr — ABNORMAL HIGH (ref 0–16)

## 2014-02-05 MED ORDER — CEPHALEXIN 500 MG PO CAPS
500.0000 mg | ORAL_CAPSULE | Freq: Three times a day (TID) | ORAL | Status: DC
  Administered 2014-02-05 – 2014-02-09 (×14): 500 mg via ORAL
  Filled 2014-02-05 (×15): qty 1

## 2014-02-05 MED ORDER — CIPROFLOXACIN HCL 750 MG PO TABS
750.0000 mg | ORAL_TABLET | Freq: Two times a day (BID) | ORAL | Status: DC
  Administered 2014-02-05: 750 mg via ORAL
  Filled 2014-02-05 (×3): qty 1

## 2014-02-05 NOTE — CV Procedure (Signed)
WOC contacted Dr. Sharol Given to follow up on wound care orders. New topical wound care orders placed in the chart. Contacted PT with new orders for hydrotherapy.  Requested unit secretary to order iodoform packing strip to be used after hydrotherapy.  WOC will sign off, followed by orthopedics.   Discussed POC with patient and bedside nurse.  Re consult if needed, will not follow at this time. Thanks  [name], [place] [phone])

## 2014-02-05 NOTE — Progress Notes (Signed)
[facility] for Infectious Disease    Date of Admission:  01/31/2014   Total days of antibiotics 6        Day 6 vanco        Day 6 piptazo           ID: [name] is a 48 y.o. male with left bka on 9/4 complicated by wound drainage s/p revision on 10/14 now readmitted on 10/21 for SIRS, drainage from surgical site and erythema to L BKA. CT  10/27- complex fluid collection, concerning for abscess. Had drainage of abscess, and abscess wicked open- 10/29 by Dr Sharol Given.    Principal Problem:   Cellulitis and abscess of leg Active Problems:   Diabetes mellitus type 2, uncontrolled, with complications   TOBACCO ABUSE   Essential hypertension   CKD Stage 2   History of left below knee amputation   Anemia, iron deficiency  Subjective: Leukocytosis slightly improved, afebrile >48hrs. Pt in good spirits.  Medications:  . enoxaparin (LOVENOX) injection  40 mg Subcutaneous Q24H  . ferrous sulfate  325 mg Oral TID WC  . FLUoxetine  10 mg Oral Daily  . gabapentin  600 mg Oral TID  . insulin aspart  0-5 Units Subcutaneous QHS  . insulin aspart  0-9 Units Subcutaneous TID WC  . insulin aspart  5 Units Subcutaneous TID WC  . insulin glargine  36 Units Subcutaneous QHS  . lisinopril  10 mg Oral Daily  . piperacillin-tazobactam (ZOSYN)  IV  3.375 g Intravenous Q8H  . polyethylene glycol  17 g Oral Daily  . senna-docusate  1 tablet Oral QHS  . sodium chloride  3 mL Intravenous Q12H    Objective: Vital signs in last 24 hours: Temp:  [98.2 F (36.8 C)-99 F (37.2 C)] 98.2 F (36.8 C) (10/29 0634) Pulse Rate:  [99-109] 99 (10/29 0634) Resp:  [18] 18 (10/29 0634) BP: (127-188)/(77-91) 135/77 mmHg (10/29 0634) SpO2:  [98 %-100 %] 100 % (10/29 0634)  Lab Results  Recent Labs  02/04/14 0653 02/04/14 1803 02/05/14 0605  WBC 13.2* 13.5* 12.5*  HGB 7.3* 7.5* 7.4*  HCT 22.5* 23.1* 23.1*  NA 134*  --  140  K 4.5  --  4.7  CL 99  --  101  CO2 23  --  26  BUN 18  --  19   CREATININE 1.61*  --  1.80*   Liver Panel No results found for this basename: PROT, ALBUMIN, AST, ALT, ALKPHOS, BILITOT, BILIDIR, IBILI,  in the last 72 hours Lab Results  Component Value Date   ESRSEDRATE 138* 02/05/2014   Lab Results  Component Value Date   CRP 25.4* 01/31/2014    Microbiology: 10/27 Wound cx - E coli , Final, Pan Sensitive. 10/24 Blood culture- NG >>  Studies/Results: Ct Knee Left Wo Contrast  02/03/2014   CLINICAL DATA:  Left lower extremity amputation. A clinical concern for infection.  EXAM: CT OF THE LEFT KNEE WITHOUT CONTRAST  TECHNIQUE: Multidetector CT imaging of the LEFT knee was performed according to the standard protocol. Multiplanar CT image reconstructions were also generated.  COMPARISON:  None.  FINDINGS: The patient is status post revision amputation on 01/23/2014.  There is below-the-knee amputation. There is a complex fluid collection measuring approximately 7 x 3 x 4 cm at the distal stump with multiple foci of air within the collection most concerning for an abscess. The abscess abuts the distal tibial stump. There is no cortical erosion.  There is a medullary defect of the distal tibial diaphysis which may be postsurgical versus extension of infection.  There is no other focal fluid collection. There is no fracture or dislocation. There is a small joint effusion.  IMPRESSION: 1. Left below-the-knee amputation. Complex fluid collection measuring 7 x 3 x 4 cm at the distal stump with multiple foci of air within the collection most concerning for an abscess which abuts the distal tibial stump. No definite cortical erosion. Medullary defect of the central distal tibial diaphysis which may be postsurgical versus extension of infection.   Electronically Signed   By: Kathreen Devoid   On: 02/03/2014 13:41    Assessment/Plan: E.coli Post surgical wound infection = purulent drainage from his recent revision of L BKA is consistent with deep tissue infection. Wound  cx growing pan sensitive Ecoli. Underwent bedside debridement done today, and wicked open.  Will recommend Oral keflex, 500 TID, for 4 weeks. This is chosen instead of cipro to minimize risk of  C. Difficile,  can stop IV zosyn.  - will have him follow up in ID clinic in 3-4 wk to see how his wound is healing to determine further antibiotic therapy  - Will sign off.   Jenetta Downer [phone] IMTS, PGY-2 02/05/2014, 10:47 AM  ----------------------------------------------  I have examined and reviewed results of the patient. I have discussed assessment and plan as described by Dr. Denton Brick. Agree with plan as outlined above.  [facility] Infectious [facility] [phone]

## 2014-02-05 NOTE — Progress Notes (Signed)
  Date: 02/05/2014  Patient name: [name]  Medical record number: [id number]  Date of birth: [birth year]   This patient has been seen and the plan of care was discussed with the house staff. Please see their note for complete details. I concur with their findings with the following additions/corrections:  Cipro recommended by ID has been started.  Patient with packing done by orthopedics, to be checked by that team on Monday.    Sid Falcon, MD 02/05/2014, 3:45 PM

## 2014-02-05 NOTE — Progress Notes (Signed)
Physical Therapy Treatment Patient Details Name: [name] MRN: [id number] DOB: [birth year] Today's Date: 02/05/2014    History of Present Illness LUCIEN BUDNEY is a 48 y.o. 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 He was admitted on 10/14 for fevers, leukocytosis, redness/pain and drainage from incision site of L-BKA. He underwent revision/IX D on 10/16, no OR cultures sent and not discharged home on antibiotics. Initially doing well after being discharged but again started to have fevers, chills, erythema, pain and drainage from incision site. He was readmitted on 10/24 with temp of 101F x 2 days, pain to stump, drainage and found to have leukocytosis of 14K. We was started again on vancomycin and piptazo. Blood cx ngtd. Evaluated by Dr. Sharol Given who felt no need for further surgery. He still is having sanginous purulent drainage from his leg. Hx of Rt BKA.    PT Comments    Emphasis on exercise and transfers which pt accomplished to w/c smoothly  Follow Up Recommendations  Home health PT;Supervision/Assistance - 24 hour     Equipment Recommendations  Other (comment)    Recommendations for Other Services       Precautions / Restrictions Precautions Precautions: Fall Restrictions Other Position/Activity Restrictions: bilateral BKAs    Mobility  Bed Mobility Overal bed mobility: Modified Independent             General bed mobility comments: manages up to long sitting independently   Transfers Overall transfer level: Needs assistance Equipment used: None Transfers: Anterior-Posterior Firefighter transfers: Supervision  Lateral/Scoot Transfers: Supervision General transfer comment: Good safety and smooth transfer execution.  Ambulation/Gait                 Cytogeneticist mobility: Yes Wheelchair propulsion: Both upper extremities  Modified Rankin (Stroke Patients Only)       Balance Overall balance assessment: No apparent balance deficits (not formally assessed)   Sitting balance-Leahy Scale: Good                              Cognition Arousal/Alertness: Awake/alert Behavior During Therapy: WFL for tasks assessed/performed Overall Cognitive Status: Within Functional Limits for tasks assessed                      Exercises Other Exercises Other Exercises: 10 reps each of knee flexion, knee extension, hip flexion and hip extension bilateral LE with moderate resistance.    General Comments        Pertinent Vitals/Pain Pain Assessment: Faces Faces Pain Scale: Hurts little more Pain Location: L leg Pain Descriptors / Indicators: Jabbing;Sharp;Sore Pain Intervention(s): Limited activity within patient's tolerance;Monitored during session    Home Living                      Prior Function            PT Goals (current goals can now be found in the care plan section) Acute Rehab PT Goals Patient Stated Goal: go back home PT Goal Formulation: With patient Time For Goal Achievement: 02/10/14 Potential to Achieve Goals: Good Progress towards PT goals: Progressing toward goals    Frequency  Min 3X/week    PT Plan Current  plan remains appropriate    Co-evaluation             End of Session   Activity Tolerance: Patient tolerated treatment well Patient left: in bed;with call bell/phone within reach;with family/visitor present     Time: 7902-4097 PT Time Calculation (min): 27 min  Charges:  $Therapeutic Exercise: 8-22 mins $Therapeutic Activity: 8-22 mins                    G Codes:      Aundria Bitterman, Tessie Fass 02/05/2014, 3:46 PM 02/05/2014  Donnella Sham, PT [phone]  (pager)

## 2014-02-05 NOTE — Progress Notes (Signed)
Physical Therapy Wound Treatment Patient Details  Name: CHARAN PRIETO MRN: [id number] Date of Birth: February 15, 1966  Today's Date: 02/05/2014 Time: 7619-5093 Time Calculation (min): 40 min  Subjective  Subjective: I think they let it close too soon Patient and Family Stated Goals: I need these healed up so I can get my leg and start walking  Pain Score: Pain Score: 2   Wound Assessment  Wound / Incision (Open or Dehisced) 02/05/14 Other (Comment) Leg Left 3 openings to closure of L transtibial amp (Active)  Dressing Type Gauze (Comment) 02/05/2014 11:00 AM  Dressing Changed Changed 02/05/2014 11:00 AM  Dressing Status Intact;Clean;Dry 02/05/2014 11:00 AM  Site / Wound Assessment Bleeding;Pink 02/05/2014 11:00 AM  % Wound base Red or Granulating 90% 02/05/2014 11:00 AM  % Wound base Yellow 10% 02/05/2014 11:00 AM  % Wound base Black 0% 02/05/2014 11:00 AM  % Wound base Other (Comment) 0% 02/05/2014 11:00 AM  Peri-wound Assessment Intact 02/05/2014 11:00 AM  Wound Length (cm) 0.5 cm 02/05/2014 11:00 AM  Wound Width (cm) 1 cm 02/05/2014 11:00 AM  Wound Depth (cm) 4 cm 02/05/2014 11:00 AM  Drainage Amount Copious 02/05/2014 11:00 AM  Drainage Description Serosanguineous;Purulent 02/05/2014 11:00 AM  Treatment Cleansed;Debridement (Selective);Hydrotherapy (Pulse lavage);Other (Comment) 02/05/2014 11:00 AM     Incision (Closed) 01/23/14 Leg Left (Active)  Dressing Type None 02/05/2014  7:15 AM  Dressing Dry;Intact 02/05/2014  7:15 AM  Site / Wound Assessment Clean;Painful 02/05/2014  7:15 AM  Incision Length (cm) 22 cm 01/31/2014  3:53 PM  Margins Attached edges (approximated) 02/05/2014  7:15 AM  Closure None 02/05/2014  7:15 AM  Drainage Amount Moderate 02/05/2014  7:15 AM  Drainage Description Serosanguineous 02/05/2014  7:15 AM  Treatment Cleansed 01/31/2014  3:53 PM   Hydrotherapy Pulsed lavage therapy - wound location: leg incision Pulsed Lavage with Suction (psi): 4  psi Pulsed Lavage with Suction - Normal Saline Used: 1000 mL Selective Debridement Selective Debridement - Location: Transtib incision Left Selective Debridement - Tools Used: Forceps Selective Debridement - Tissue Removed: slough   Wound Assessment and Plan  Wound Therapy - Assess/Plan/Recommendations Wound Therapy - Clinical Statement: PLS for irrigation, minor selective debridement and wick dressing will benefit this wound to reduce purulence and prepare for closure Wound Therapy - Functional Problem List: bil Transtibial amputations limit function. Factors Delaying/Impairing Wound Healing: Infection - systemic/local Hydrotherapy Plan: Debridement;Dressing change;Patient/family education;Pulsatile lavage with suction Wound Therapy - Frequency: 6X / week Wound Therapy - Follow Up Recommendations: [place] Wound Plan: see above  Wound Therapy Goals- Improve the function of patient's integumentary system by progressing the wound(s) through the phases of wound healing (inflammation - proliferation - remodeling) by: Decrease Necrotic Tissue to: 0 Decrease Necrotic Tissue - Progress: Goal set today Increase Granulation Tissue to: 100 Increase Granulation Tissue - Progress: Goal set today Improve Drainage Characteristics: Serous;Other (comment) (free of purulence) Improve Drainage Characteristics - Progress: Goal set today Time For Goal Achievement: 7 days Wound Therapy - Potential for Goals: Good  Goals will be updated until maximal potential achieved or discharge criteria met.  Discharge criteria: when goals achieved, discharge from hospital, MD decision/surgical intervention, no progress towards goals, refusal/missing three consecutive treatments without notification or medical reason.  GP     Abanoub Hanken, Tessie Fass 02/05/2014, 12:22 PM  02/05/2014  Donnella Sham, [place] [phone]  (pager)

## 2014-02-05 NOTE — Progress Notes (Signed)
Patient ID: [name], male   DOB: 1965-07-26, 48 y.o.   MRN: [id number] Sutures and staples removed from left transtibial amputation at bedside. The purulent abscess was decompressed and wicked open with a new dry dressing. We'll have hydrotherapy started daily.

## 2014-02-06 DIAGNOSIS — L03116 Cellulitis of left lower limb: Secondary | ICD-10-CM

## 2014-02-06 DIAGNOSIS — L02416 Cutaneous abscess of left lower limb: Secondary | ICD-10-CM

## 2014-02-06 LAB — CULTURE, BLOOD (ROUTINE X 2)
CULTURE: NO GROWTH
Culture: NO GROWTH

## 2014-02-06 LAB — CBC
HCT: 21.8 % — ABNORMAL LOW (ref 39.0–52.0)
HCT: 21.9 % — ABNORMAL LOW (ref 39.0–52.0)
Hemoglobin: 6.9 g/dL — CL (ref 13.0–17.0)
Hemoglobin: 7 g/dL — ABNORMAL LOW (ref 13.0–17.0)
MCH: 28.4 pg (ref 26.0–34.0)
MCH: 27.9 pg (ref 26.0–34.0)
MCHC: 31.7 g/dL (ref 30.0–36.0)
MCHC: 32 g/dL (ref 30.0–36.0)
MCV: 89.7 fL (ref 78.0–100.0)
MCV: 87.3 fL (ref 78.0–100.0)
PLATELETS: 764 10*3/uL — AB (ref 150–400)
PLATELETS: 790 10*3/uL — AB (ref 150–400)
RBC: 2.43 MIL/uL — ABNORMAL LOW (ref 4.22–5.81)
RBC: 2.51 MIL/uL — ABNORMAL LOW (ref 4.22–5.81)
RDW: 13.7 % (ref 11.5–15.5)
RDW: 13.7 % (ref 11.5–15.5)
WBC: 11 10*3/uL — ABNORMAL HIGH (ref 4.0–10.5)
WBC: 9.7 10*3/uL (ref 4.0–10.5)

## 2014-02-06 LAB — GLUCOSE, CAPILLARY
Glucose-Capillary: 164 mg/dL — ABNORMAL HIGH (ref 70–99)
Glucose-Capillary: 65 mg/dL — ABNORMAL LOW (ref 70–99)
Glucose-Capillary: 80 mg/dL (ref 70–99)
Glucose-Capillary: 213 mg/dL — ABNORMAL HIGH (ref 70–99)
Glucose-Capillary: 236 mg/dL — ABNORMAL HIGH (ref 70–99)
Glucose-Capillary: 172 mg/dL — ABNORMAL HIGH (ref 70–99)

## 2014-02-06 MED ORDER — INSULIN ASPART 100 UNIT/ML ~~LOC~~ SOLN
4.0000 [IU] | Freq: Three times a day (TID) | SUBCUTANEOUS | Status: DC
  Administered 2014-02-06 – 2014-02-07 (×3): 4 [IU] via SUBCUTANEOUS

## 2014-02-06 MED ORDER — DIPHENHYDRAMINE-ZINC ACETATE 2-0.1 % EX CREA
TOPICAL_CREAM | Freq: Two times a day (BID) | CUTANEOUS | Status: DC | PRN
  Administered 2014-02-07 (×2): via TOPICAL
  Administered 2014-02-07: 1 via TOPICAL
  Filled 2014-02-06: qty 28

## 2014-02-06 NOTE — Progress Notes (Signed)
Subjective: Pt wondering if one of his sutures needs to be removed, informed him that PT can remove it when they do his daily dressings today.  CBG dropped to 65 after receiving 7 total units of insulin this morning. He immediately ate breakfast after receiving insulin. Pt was given additional food with CBG of 80 afterwards. Pt was sitting in wheel chair in NAD.   Objective: Vital signs in last 24 hours: Filed Vitals:   02/05/14 0634 02/05/14 1333 02/05/14 2148 02/06/14 0503  BP: 135/77 155/80 146/83 144/76  Pulse: 99 99 113 98  Temp: 98.2 F (36.8 C) 98.1 F (36.7 C) 99.3 F (37.4 C) 98.5 F (36.9 C)  TempSrc: Oral Oral Oral Oral  Resp: 18 16 18 20   Height:      Weight:      SpO2: 100% 96% 100% 99%   Weight change:   Intake/Output Summary (Last 24 hours) at 02/06/14 0758 Last data filed at 02/06/14 0025  Gross per 24 hour  Intake    720 ml  Output   1350 ml  Net   -630 ml   General Appearance:  Alert, cooperative, no distress,non diaphoretic   Head:  Normocephalic, without obvious abnormality, atraumatic   Eyes:   conjunctiva/corneas clear, EOM's intact   Lungs:  respirations unlabored , no accessory muscle use, CTAB  Heart:  Regular rate and rhythm, S1 and S2 normal, no murmur, rub or gallop   Abdomen:  Soft, non-tender, bowel sounds active   Extremities:  B/l BKA, lt bka stump wrapped w gauze, dressing dry  Neurologic:  CNII-XII grossly intact.       Lab Results: Basic Metabolic Panel:  Recent Labs Lab 02/04/14 0653 02/05/14 0605  NA 134* 140  K 4.5 4.7  CL 99 101  CO2 23 26  GLUCOSE 226* 195*  BUN 18 19  CREATININE 1.61* 1.80*  CALCIUM 8.9 9.2   CBC:  Recent Labs Lab 02/04/14 1803 02/05/14 0605 02/06/14 0400 02/06/14 0655  WBC 13.5* 12.5* 11.0* 9.7  NEUTROABS 9.9* 9.0*  --   --   HGB 7.5* 7.4* 6.9* 7.0*  HCT 23.1* 23.1* 21.8* 21.9*  MCV 87.5 90.2 89.7 87.3  PLT 749* 737* 764* 790*   CBG:  Recent Labs Lab 02/04/14 2147  02/05/14 0744 02/05/14 1200 02/05/14 1646 02/05/14 2144 02/06/14 0755  GLUCAP 196* 153* 149* 194* 144* 164*    Studies/Results: No results found. Medications: I have reviewed the patient's current medications. Scheduled Meds: . cephALEXin  500 mg Oral 3 times per day  . enoxaparin (LOVENOX) injection  40 mg Subcutaneous Q24H  . ferrous sulfate  325 mg Oral TID WC  . FLUoxetine  10 mg Oral Daily  . gabapentin  600 mg Oral TID  . insulin aspart  0-5 Units Subcutaneous QHS  . insulin aspart  0-9 Units Subcutaneous TID WC  . insulin aspart  5 Units Subcutaneous TID WC  . insulin glargine  36 Units Subcutaneous QHS  . lisinopril  10 mg Oral Daily  . polyethylene glycol  17 g Oral Daily  . senna-docusate  1 tablet Oral QHS  . sodium chloride  3 mL Intravenous Q12H   Continuous Infusions:  PRN Meds:.cyclobenzaprine, docusate sodium, oxyCODONE-acetaminophen, pantoprazole, promethazine Assessment/Plan: Principal Problem:   Cellulitis and abscess of leg Active Problems:   Diabetes mellitus type 2, uncontrolled, with complications   TOBACCO ABUSE   Essential hypertension   CKD Stage 2   History of left below knee amputation  Anemia, iron deficiency   Cellulitis of left BKA - CT of left knee reveals abscess with post sx medullary defect of tibia vs extension of infxn, Dr. Sharol Given aware and believes this is most likely post sx changes due to recent lt stump revision on 10/16. Wound culture positive for E.coli - daily hydrotherapy, Dr. Sharol Given to see patient Monday. will likely need SNF placement for wound care - po keflex 500mg  TID - WBCs trending down to 9.7 from 11 yesterday - blood cultures  Negative - percocet q4h prn for pain control  - flexeril 5mg  TID prn for muscle cramping - PT recommend SNF, consulted SW to assist patient w/ option of SNF vs motel w/ home health visiting there  Diabetes mellitus type 2, uncontrolled, with complications --Patient is prone to hypoglycemia per  previous documentation. A1c 9.6 on 10/14. Pt reports feeling hypoglycemic at CBGs in the high 100's - 200's.  - Lantus 36 u daily, can be decreased to 32 if pt continues to have low CBGs  - Mealtime coverage decreased to 4U due to low CBG of 65, will continue to monitor - Continue SSI-S   Fe deficiency anemia--likely anemia of chronic disease ( Fe low, TIBC low, ferritin 151) - Ferrous sulfate 325 TID - colace prn for constipation  Thrombocytosis-- likely reactive 2/2 to wound infxn.  - will continue to monitor  Essential hypertension- normotensive - continue Lisinopril 10mg    CKD Stage 2-- Cr 1.45 10/25 improved from baseline of 1.51.  - will continue to monitor  DVT prophylaxis- lovenox   Diet- carb modified  Dispo: Disposition is deferred at this time, awaiting improvement of current medical problems.   The patient does have a current PCP [name], MD) and does need an [facility] hospital follow-up appointment after discharge.   The patient does not have transportation limitations that hinder transportation to clinic appointments. .Services Needed at time of discharge: Y = Yes, Blank = No PT:  SNF  OT:   RN:   Equipment:   Other:     LOS: 6 days   Julious Oka, MD 02/06/2014, 7:58 AM

## 2014-02-06 NOTE — Progress Notes (Signed)
Hypoglycemic Event  CBG: 65  Treatment: 15 GM carbohydrate snack  Symptoms:Sweaty   Follow-up CBG: Time:1009 CBG Result:80  Possible Reasons for Event: Unknown  Comments/MD notified:Yes    [name] L  Remember to initiate Hypoglycemia Order Set & complete

## 2014-02-06 NOTE — Progress Notes (Signed)
  Date: 02/06/2014  Patient name: [name]  Medical record number: [id number]  Date of birth: [birth year]   This patient has been seen and the plan of care was discussed with the house staff. Please see their note for complete details. I concur with their findings.  Sid Falcon, MD 02/06/2014, 8:18 PM

## 2014-02-06 NOTE — Progress Notes (Signed)
Inpatient Diabetes Program Recommendations  AACE/ADA: New Consensus Statement on Inpatient Glycemic Control (2013)  Target Ranges:  Prepandial:   less than 140 mg/dL      Peak postprandial:   less than 180 mg/dL (1-2 hours)      Critically ill patients:  140 - 180 mg/dL     Results for COLLEN, VINCENT (MRN [id number]) as of 02/06/2014 09:50  Ref. Range 02/06/2014 07:55 02/06/2014 09:46  Glucose-Capillary Latest Range: 70-99 mg/dL 164 (H) 65 (L)     Mild Hypoglycemia this AM after receiving 5 units Novolog Meal Coverage and 2 units Novolog Correction scale.    MD- Please consider decreasing Novolog Meal Coverage back to 4 units tid with meals if this trend continues    Will follow Wyn Quaker RN, MSN, CDE Diabetes Coordinator Inpatient Diabetes Program Team Pager: [phone] (8a-10p)

## 2014-02-06 NOTE — Progress Notes (Signed)
Physical Therapy Wound Treatment Patient Details  Name: [name] MRN: [id number] Date of Birth: 1966-03-03  Today's Date: 02/06/2014 Time: 9179-1505 Time Calculation (min): 24 min  Subjective  Subjective: I think they let it close too soon Patient and Family Stated Goals: I need these healed up so I can get my leg and start walking  Pain Score: Pain Score: 4   Wound Assessment  Wound / Incision (Open or Dehisced) 02/05/14 Other (Comment) Leg Left 3 openings to closure of L transtibial amp (Active)  Dressing Type Gauze (Comment);ABD 02/06/2014 10:53 AM  Dressing Changed Changed 02/06/2014 10:53 AM  Dressing Status Intact;Clean;Dry 02/06/2014 10:53 AM  Site / Wound Assessment Bleeding;Pink 02/06/2014 10:53 AM  % Wound base Red or Granulating 95% 02/06/2014 10:53 AM  % Wound base Yellow 5% 02/06/2014 10:53 AM  % Wound base Black 0% 02/06/2014 10:53 AM  % Wound base Other (Comment) 0% 02/06/2014 10:53 AM  Peri-wound Assessment Intact 02/06/2014 10:53 AM  Wound Length (cm) 0.5 cm 02/05/2014 11:00 AM  Wound Width (cm) 1 cm 02/05/2014 11:00 AM  Wound Depth (cm) 4 cm 02/05/2014 11:00 AM  Drainage Amount Copious 02/06/2014 10:53 AM  Drainage Description Serosanguineous;Purulent 02/06/2014 10:53 AM  Treatment Cleansed;Debridement (Selective);Hydrotherapy (Pulse lavage);Packing (Impregnated strip) 02/06/2014 10:53 AM     Incision (Closed) 01/23/14 Leg Left (Active)  Dressing Type None 02/06/2014  7:55 AM  Dressing Dry;Intact 02/06/2014  7:55 AM  Site / Wound Assessment Dressing in place / Unable to assess 02/06/2014  7:55 AM  Incision Length (cm) 22 cm 01/31/2014  3:53 PM  Margins Attached edges (approximated) 02/05/2014  7:15 AM  Closure None 02/05/2014  7:15 AM  Drainage Amount Moderate 02/05/2014  7:15 AM  Drainage Description Serosanguineous 02/05/2014  7:15 AM  Treatment Cleansed 01/31/2014  3:53 PM   Hydrotherapy Pulsed lavage therapy - wound location: leg incision Pulsed  Lavage with Suction (psi): 4 psi Pulsed Lavage with Suction - Normal Saline Used: 1000 mL Pulsed Lavage Tip: Tunneling tip Selective Debridement Selective Debridement - Location: Transtib incision Left Selective Debridement - Tools Used: Forceps Selective Debridement - Tissue Removed: slough   Wound Assessment and Plan  Wound Therapy - Assess/Plan/Recommendations Wound Therapy - Clinical Statement: PLS for irrigation, minor selective debridement and wick dressing will benefit this wound to reduce purulence and prepare for closure Wound Therapy - Functional Problem List: bil Transtibial amputations limit function. Factors Delaying/Impairing Wound Healing: Infection - systemic/local Hydrotherapy Plan: Debridement;Dressing change;Patient/family education;Pulsatile lavage with suction Wound Therapy - Frequency: 6X / week Wound Therapy - Follow Up Recommendations: [place] Wound Plan: see above  Wound Therapy Goals- Improve the function of patient's integumentary system by progressing the wound(s) through the phases of wound healing (inflammation - proliferation - remodeling) by: Decrease Necrotic Tissue to: 0 Decrease Necrotic Tissue - Progress: Progressing toward goal Increase Granulation Tissue to: 100 Increase Granulation Tissue - Progress: Progressing toward goal Improve Drainage Characteristics: Serous;Other (comment) (free of purulence) Time For Goal Achievement: 7 days Wound Therapy - Potential for Goals: Good  Goals will be updated until maximal potential achieved or discharge criteria met.  Discharge criteria: when goals achieved, discharge from hospital, MD decision/surgical intervention, no progress towards goals, refusal/missing three consecutive treatments without notification or medical reason.  GP     Fenton Candee, Tessie Fass 02/06/2014, 10:55 AM  02/06/2014  Donnella Sham, PT [phone]  (pager)

## 2014-02-07 LAB — GLUCOSE, CAPILLARY
GLUCOSE-CAPILLARY: 168 mg/dL — AB (ref 70–99)
GLUCOSE-CAPILLARY: 247 mg/dL — AB (ref 70–99)
GLUCOSE-CAPILLARY: 343 mg/dL — AB (ref 70–99)
Glucose-Capillary: 212 mg/dL — ABNORMAL HIGH (ref 70–99)

## 2014-02-07 LAB — CBC WITH DIFFERENTIAL/PLATELET
Basophils Absolute: 0.1 10*3/uL (ref 0.0–0.1)
Basophils Relative: 1 % (ref 0–1)
EOS ABS: 0.3 10*3/uL (ref 0.0–0.7)
Eosinophils Relative: 3 % (ref 0–5)
HEMATOCRIT: 23.8 % — AB (ref 39.0–52.0)
HEMOGLOBIN: 7.6 g/dL — AB (ref 13.0–17.0)
Lymphocytes Relative: 27 % (ref 12–46)
Lymphs Abs: 2.3 10*3/uL (ref 0.7–4.0)
MCH: 27.8 pg (ref 26.0–34.0)
MCHC: 31.9 g/dL (ref 30.0–36.0)
MCV: 87.2 fL (ref 78.0–100.0)
MONO ABS: 0.7 10*3/uL (ref 0.1–1.0)
Monocytes Relative: 8 % (ref 3–12)
NEUTROS ABS: 5.3 10*3/uL (ref 1.7–7.7)
Neutrophils Relative %: 61 % (ref 43–77)
Platelets: 847 10*3/uL — ABNORMAL HIGH (ref 150–400)
RBC: 2.73 MIL/uL — ABNORMAL LOW (ref 4.22–5.81)
RDW: 13.8 % (ref 11.5–15.5)
WBC: 8.7 10*3/uL (ref 4.0–10.5)

## 2014-02-07 MED ORDER — INSULIN ASPART 100 UNIT/ML ~~LOC~~ SOLN
0.0000 [IU] | Freq: Three times a day (TID) | SUBCUTANEOUS | Status: DC
  Administered 2014-02-07 (×2): 5 [IU] via SUBCUTANEOUS
  Administered 2014-02-08: 11 [IU] via SUBCUTANEOUS

## 2014-02-07 NOTE — Progress Notes (Signed)
Case d/w Dr. Denton Brick and I agree with documentation as outlined

## 2014-02-07 NOTE — Progress Notes (Signed)
Physical Therapy Wound Treatment Patient Details  Name: [name] MRN: [id number] Date of Birth: Jul 25, 1965  Today's Date: 02/07/2014 Time: 0175-1025 Time Calculation (min): 27 min  Subjective  Subjective: I have neuropathy.  But it hurts some when you do the treatment. Patient and Family Stated Goals: I need these healed up so I can get my leg and start walking  Pain Score: Pain Score: 5   Wound Assessment  Wound / Incision (Open or Dehisced) 02/05/14 Other (Comment) Leg Left 3 openings to closure of L transtibial amp (Active)  Dressing Type Gauze (Comment);ABD 02/07/2014  8:58 AM  Dressing Changed Changed 02/07/2014  8:58 AM  Dressing Status Intact;Clean;Dry 02/07/2014  8:58 AM  Site / Wound Assessment Bleeding;Pink 02/07/2014  8:58 AM  % Wound base Red or Granulating 95% 02/07/2014  8:58 AM  % Wound base Yellow 5% 02/07/2014  8:58 AM  % Wound base Black 0% 02/07/2014  8:58 AM  % Wound base Other (Comment) 0% 02/07/2014  8:58 AM  Peri-wound Assessment Intact 02/07/2014  8:58 AM  Drainage Amount Copious 02/07/2014  8:58 AM  Drainage Description Serosanguineous;Purulent 02/07/2014  8:58 AM  Treatment Hydrotherapy (Pulse lavage);Debridement (Selective);Cleansed;Packing (Impregnated strip) 02/07/2014  8:58 AM      Hydrotherapy Pulsed lavage therapy - wound location: Left leg incision Pulsed Lavage with Suction (psi): 4 psi Pulsed Lavage with Suction - Normal Saline Used: 1000 mL Pulsed Lavage Tip: Tunneling tip Selective Debridement Selective Debridement - Location: Transtib incision Left Selective Debridement - Tools Used: Forceps Selective Debridement - Tissue Removed: slough  Remaining stitch removed per MD orders  Wound Assessment and Plan  Wound Therapy - Assess/Plan/Recommendations Wound Therapy - Clinical Statement: PLS for irrigation, minor selective debridement and wick dressing will benefit this wound to reduce purulence and prepare for closure Wound Therapy -  Functional Problem List: bil Transtibial amputations limit function; wound prevents use of prosthesis Factors Delaying/Impairing Wound Healing: Infection - systemic/local Hydrotherapy Plan: Debridement;Dressing change;Patient/family education;Pulsatile lavage with suction Wound Therapy - Frequency: 6X / week Wound Therapy - Follow Up Recommendations: [place] Wound Plan: see above  Wound Therapy Goals- Improve the function of patient's integumentary system by progressing the wound(s) through the phases of wound healing (inflammation - proliferation - remodeling) by: Decrease Necrotic Tissue to: 0 Decrease Necrotic Tissue - Progress: Progressing toward goal Increase Granulation Tissue to: 100 Increase Granulation Tissue - Progress: Progressing toward goal Improve Drainage Characteristics: Serous;Other (comment) (free of purulence) Improve Drainage Characteristics - Progress: Progressing toward goal Time For Goal Achievement: 7 days Wound Therapy - Potential for Goals: Good  Goals will be updated until maximal potential achieved or discharge criteria met.  Discharge criteria: when goals achieved, discharge from hospital, MD decision/surgical intervention, no progress towards goals, refusal/missing three consecutive treatments without notification or medical reason.  GP     Despina Pole 02/07/2014, 9:10 AM Carita Pian. Sanjuana Kava, [place] Pager [phone]

## 2014-02-07 NOTE — Progress Notes (Signed)
Subjective: No complaints today. Pt will like to go home. Has been ambulating in the hall way. Wife at bedside. Pt will prefer to go to a Motel, he has done this in the past, when he had a picc line for IV drugs. Pt will prefer a motel, as he has not had a good a experience with SNF.   Objective: Vital signs in last 24 hours: Filed Vitals:   02/06/14 0503 02/06/14 1417 02/06/14 2145 02/07/14 0511  BP: 144/76 151/88 161/89 131/85  Pulse: 98 101 100 91  Temp: 98.5 F (36.9 C) 98.3 F (36.8 C) 98.6 F (37 C) 98.3 F (36.8 C)  TempSrc: Oral Oral Oral Oral  Resp: 20 18 18 18   Height:      Weight:      SpO2: 99% 100% 97% 100%   Weight change:   Intake/Output Summary (Last 24 hours) at 02/07/14 1257 Last data filed at 02/07/14 0900  Gross per 24 hour  Intake    730 ml  Output   1000 ml  Net   -270 ml   GENERAL- alert, co-operative, appears as stated age, not in any distress. HEENT- Atraumatic, normocephalic, PERRL, EOMI, oral mucosa appears moist neck supple. CARDIAC- Regular, no added sounds. RESP- Moving equal volumes of air, and clear to auscultation bilaterally, no wheezes or crackles. ABDOMEN- Soft, nontender,  bowel sounds present. NEURO- No obvious Cr N abnormality EXTREMITIES- Dressing - left stump-BKA, bilat amputations. SKIN- Warm, dry, No rash or lesion. PSYCH- Normal mood and affect, appropriate thought content and speech.  Lab Results: Basic Metabolic Panel:  Recent Labs Lab 02/04/14 0653 02/05/14 0605  NA 134* 140  K 4.5 4.7  CL 99 101  CO2 23 26  GLUCOSE 226* 195*  BUN 18 19  CREATININE 1.61* 1.80*  CALCIUM 8.9 9.2   CBC:  Recent Labs Lab 02/05/14 0605  02/06/14 0655 02/07/14 0610  WBC 12.5*  < > 9.7 8.7  NEUTROABS 9.0*  --   --  5.3  HGB 7.4*  < > 7.0* 7.6*  HCT 23.1*  < > 21.9* 23.8*  MCV 90.2  < > 87.3 87.2  PLT 737*  < > 790* 847*  < > = values in this interval not displayed. CBG:  Recent Labs Lab 02/06/14 1009 02/06/14 1204  02/06/14 1713 02/06/14 2143 02/07/14 0753 02/07/14 1218  GLUCAP 80 213* 236* 172* 168* 212*   Medications: I have reviewed the patient's current medications. Scheduled Meds: . cephALEXin  500 mg Oral 3 times per day  . enoxaparin (LOVENOX) injection  40 mg Subcutaneous Q24H  . ferrous sulfate  325 mg Oral TID WC  . FLUoxetine  10 mg Oral Daily  . gabapentin  600 mg Oral TID  . insulin aspart  0-5 Units Subcutaneous QHS  . insulin aspart  0-9 Units Subcutaneous TID WC  . insulin aspart  4 Units Subcutaneous TID WC  . insulin glargine  36 Units Subcutaneous QHS  . lisinopril  10 mg Oral Daily  . polyethylene glycol  17 g Oral Daily  . senna-docusate  1 tablet Oral QHS  . sodium chloride  3 mL Intravenous Q12H   Continuous Infusions:  PRN Meds:.cyclobenzaprine, diphenhydrAMINE-zinc acetate, docusate sodium, oxyCODONE-acetaminophen, pantoprazole, promethazine Assessment/Plan: Principal Problem:   Cellulitis and abscess of leg Active Problems:   Diabetes mellitus type 2, uncontrolled, with complications   TOBACCO ABUSE   Essential hypertension   CKD Stage 2   History of left below knee amputation  Anemia, iron deficiency  Cellulitis of left BKA - I and D-  10/29. Wound culture positive for E.coli. On Day 8 of antibiotics, keflex presently Day- 3. To complete total of  4 week of antibiotics. Talked to SW, SNF still pending, will have to wait till monday to find out if pt will be still be a candidate for the [facility] option. SW to find out on Monday, if pt is still here. - Daily hydrotherapy, Dr. Sharol Given to see patient Monday.  - PT- HH PT, 24 supervision, needs wound care - Cont - PO keflex. - WBCs- still trending 8.7 today, from 18.6 10/27 - percocet q4h prn for pain control  - flexeril 5mg  TID prn for muscle cramping  Diabetes mellitus type 2, uncontrolled, with complications --Hx of hypoglycemia per previous documentation. A1c 9.6 on 10/14.  - Lantus 36 u daily - Change SSI-  from sensitive to to moderate.  Fe deficiency anemia--likely anemia of chronic disease ( Fe low, TIBC low, ferritin 151) - Ferrous sulfate 325 TID - Miralax for constipation  Thrombocytosis-- likely reactive 2/2 to wound infxn.  - will continue to monitor - CBC on Monday  Essential hypertension- normotensive - continue Lisinopril 10mg    CKD Stage 2-- Cr 1.45 10/25 improved from baseline of 1.51.  - will continue to monitor  DVT prophylaxis- lovenox   Diet- carb modified  Dispo: Disposition awaiting placement- SNF, Vs ? Motel.  The patient does have a current PCP [name], MD) and does need an [facility] hospital follow-up appointment after discharge.   The patient does not have transportation limitations that hinder transportation to clinic appointments. .Services Needed at time of discharge: Y = Yes, Blank = No PT:  SNF  OT:   RN:   Equipment:   Other:     LOS: 7 days   Bethena Roys, MD 02/07/2014, 12:57 PM

## 2014-02-08 LAB — GLUCOSE, CAPILLARY
GLUCOSE-CAPILLARY: 184 mg/dL — AB (ref 70–99)
GLUCOSE-CAPILLARY: 107 mg/dL — AB (ref 70–99)
Glucose-Capillary: 110 mg/dL — ABNORMAL HIGH (ref 70–99)
Glucose-Capillary: 330 mg/dL — ABNORMAL HIGH (ref 70–99)
Glucose-Capillary: 166 mg/dL — ABNORMAL HIGH (ref 70–99)
Glucose-Capillary: 182 mg/dL — ABNORMAL HIGH (ref 70–99)

## 2014-02-08 MED ORDER — INSULIN ASPART 100 UNIT/ML ~~LOC~~ SOLN
4.0000 [IU] | Freq: Three times a day (TID) | SUBCUTANEOUS | Status: DC
  Administered 2014-02-08 – 2014-02-09 (×3): 4 [IU] via SUBCUTANEOUS

## 2014-02-08 MED ORDER — INSULIN ASPART 100 UNIT/ML ~~LOC~~ SOLN
0.0000 [IU] | SUBCUTANEOUS | Status: DC
  Administered 2014-02-08: 2 [IU] via SUBCUTANEOUS
  Administered 2014-02-09: 5 [IU] via SUBCUTANEOUS
  Administered 2014-02-09 (×2): 2 [IU] via SUBCUTANEOUS
  Administered 2014-02-09: 7 [IU] via SUBCUTANEOUS

## 2014-02-08 NOTE — Progress Notes (Signed)
Subjective: Pt has no complaints. Pt expresses that he "knows his body" and usually has high blood sugars at home. He knows that controlling his blood sugars is important but can feel when his blood sugars are in the 100 range b/c he gets diaphoretic.   Objective: Vital signs in last 24 hours: Filed Vitals:   02/07/14 0511 02/07/14 1400 02/07/14 2124 02/08/14 0541  BP: 131/85 142/78 139/94 150/80  Pulse: 91 99 105 98  Temp: 98.3 F (36.8 C) 98.5 F (36.9 C) 98.7 F (37.1 C) 98.2 F (36.8 C)  TempSrc: Oral Oral Oral Oral  Resp: 18 18 18 18   Height:      Weight:      SpO2: 100% 98% 100% 100%   Weight change:   Intake/Output Summary (Last 24 hours) at 02/08/14 0741 Last data filed at 02/08/14 0550  Gross per 24 hour  Intake    960 ml  Output   2725 ml  Net  -1765 ml   General Appearance:  Alert, cooperative, no distress,non diaphoretic   Head:  Normocephalic, without obvious abnormality, atraumatic   Eyes:   conjunctiva/corneas clear, EOM's intact   Lungs:  respirations unlabored , no accessory muscle use, CTAB  Heart:  Regular rate and rhythm, S1 and S2 normal, no murmur, rub or gallop   Abdomen:  Soft, non-tender, bowel sounds active   Extremities:  B/l BKA, lt bka stump dressing clean  Neurologic:  CNII-XII grossly intact.       Lab Results: Basic Metabolic Panel:  Recent Labs Lab 02/04/14 0653 02/05/14 0605  NA 134* 140  K 4.5 4.7  CL 99 101  CO2 23 26  GLUCOSE 226* 195*  BUN 18 19  CREATININE 1.61* 1.80*  CALCIUM 8.9 9.2   CBC:  Recent Labs Lab 02/05/14 0605  02/06/14 0655 02/07/14 0610  WBC 12.5*  < > 9.7 8.7  NEUTROABS 9.0*  --   --  5.3  HGB 7.4*  < > 7.0* 7.6*  HCT 23.1*  < > 21.9* 23.8*  MCV 90.2  < > 87.3 87.2  PLT 737*  < > 790* 847*  < > = values in this interval not displayed. CBG:  Recent Labs Lab 02/06/14 1713 02/06/14 2143 02/07/14 0753 02/07/14 1218 02/07/14 1736 02/07/14 2121  GLUCAP 236* 172* 168* 212* 247* 343*     Studies/Results: No results found. Medications: I have reviewed the patient's current medications. Scheduled Meds: . cephALEXin  500 mg Oral 3 times per day  . enoxaparin (LOVENOX) injection  40 mg Subcutaneous Q24H  . ferrous sulfate  325 mg Oral TID WC  . FLUoxetine  10 mg Oral Daily  . gabapentin  600 mg Oral TID  . insulin aspart  0-15 Units Subcutaneous TID WC  . insulin aspart  0-5 Units Subcutaneous QHS  . insulin glargine  36 Units Subcutaneous QHS  . lisinopril  10 mg Oral Daily  . polyethylene glycol  17 g Oral Daily  . senna-docusate  1 tablet Oral QHS  . sodium chloride  3 mL Intravenous Q12H   Continuous Infusions:  PRN Meds:.cyclobenzaprine, diphenhydrAMINE-zinc acetate, docusate sodium, oxyCODONE-acetaminophen, pantoprazole, promethazine Assessment/Plan: Principal Problem:   Cellulitis and abscess of leg Active Problems:   Diabetes mellitus type 2, uncontrolled, with complications   TOBACCO ABUSE   Essential hypertension   CKD Stage 2   History of left below knee amputation   Anemia, iron deficiency   Cellulitis of left BKA - CT of left knee  reveals abscess with post sx medullary defect of tibia vs extension of infxn, Dr. Sharol Given aware and believes this is most likely post sx changes due to recent lt stump revision on 10/16. Wound culture positive for E.coli - daily hydrotherapy, Dr. Sharol Given to see patient Monday.  - po keflex 500mg  TID - percocet q4h prn for pain control and flexeril 5mg  TID prn for muscle cramping - PT recommend SNF, consulted SW to assist patient w/ option of SNF vs motel w/ home health visiting there  Diabetes mellitus type 2, uncontrolled, with complications --Patient is prone to hypoglycemia per previous documentation. A1c 9.6 on 10/14. Pt reports feeling hypoglycemic at CBGs in the high 100's - 200's.  - Lantus 36 u daily, can be decreased to 32 if pt continues to have low CBGs  - Mealtime coverage 4U  - Continue SSI-S   Fe deficiency  anemia--likely anemia of chronic disease ( Fe low, TIBC low, ferritin 151) - Ferrous sulfate 325 TID - miralax prn for constipation  Thrombocytosis-- likely reactive 2/2 to wound infxn.  - will continue to monitor  Essential hypertension- normotensive - continue Lisinopril 10mg    CKD Stage 2-- Cr 1.45 10/25 improved from baseline of 1.51.  - will continue to monitor  DVT prophylaxis- lovenox   Diet- carb modified  Dispo: Disposition is deferred at this time, awaiting improvement of current medical problems.   The patient does have a current PCP [name], MD) and does need an [facility] hospital follow-up appointment after discharge.   The patient does not have transportation limitations that hinder transportation to clinic appointments. .Services Needed at time of discharge: Y = Yes, Blank = No PT:  SNF  OT:   RN:   Equipment:   Other:     LOS: 8 days   Julious Oka, MD 02/08/2014, 7:41 AM

## 2014-02-08 NOTE — Progress Notes (Signed)
Subjective: Pt feels good. He would like to see wound clinic every 2 days b/c in the past he has had difficulty w/ wound healing.   Spoke with social work who states that motel w/ [facility] is not an option for patient as he can only use this option once. SNF placement is available but if Dr. Sharol Given recommends hydrotherapy this will make SNF placement difficult. Per PT pt has minimal amt of pus left and [facility] is a feasible option vs daily hydrotherapy.   Objective: Vital signs in last 24 hours: Filed Vitals:   02/07/14 1400 02/07/14 2124 02/08/14 0541 02/08/14 1248  BP: 142/78 139/94 150/80 153/96  Pulse: 99 105 98 98  Temp: 98.5 F (36.9 C) 98.7 F (37.1 C) 98.2 F (36.8 C) 98.3 F (36.8 C)  TempSrc: Oral Oral Oral Oral  Resp: 18 18 18 18   Height:      Weight:      SpO2: 98% 100% 100% 100%   Weight change:   Intake/Output Summary (Last 24 hours) at 02/08/14 1313 Last data filed at 02/08/14 1250  Gross per 24 hour  Intake   1040 ml  Output   1875 ml  Net   -835 ml   General Appearance:  Alert, cooperative, no distress,non diaphoretic   Head:  Normocephalic, without obvious abnormality, atraumatic   Eyes:   conjunctiva/corneas clear, EOM's intact   Lungs:  respirations unlabored , no accessory muscle use, CTAB  Heart:  Regular rate and rhythm, S1 and S2 normal, no murmur, rub or gallop   Abdomen:  Soft, non-tender, bowel sounds active   Extremities:  B/l BKA, lt bka stump dressing clean  Neurologic:  CNII-XII grossly intact.       Lab Results: Basic Metabolic Panel:  Recent Labs Lab 02/04/14 0653 02/05/14 0605  NA 134* 140  K 4.5 4.7  CL 99 101  CO2 23 26  GLUCOSE 226* 195*  BUN 18 19  CREATININE 1.61* 1.80*  CALCIUM 8.9 9.2   CBC:  Recent Labs Lab 02/05/14 0605  02/06/14 0655 02/07/14 0610  WBC 12.5*  < > 9.7 8.7  NEUTROABS 9.0*  --   --  5.3  HGB 7.4*  < > 7.0* 7.6*  HCT 23.1*  < > 21.9* 23.8*  MCV 90.2  < > 87.3 87.2  PLT 737*  < > 790* 847*  <  > = values in this interval not displayed. CBG:  Recent Labs Lab 02/07/14 0753 02/07/14 1218 02/07/14 1736 02/07/14 2121 02/08/14 0803 02/08/14 1246  GLUCAP 168* 212* 247* 343* 110* 330*    Studies/Results: No results found. Medications: I have reviewed the patient's current medications. Scheduled Meds: . cephALEXin  500 mg Oral 3 times per day  . enoxaparin (LOVENOX) injection  40 mg Subcutaneous Q24H  . ferrous sulfate  325 mg Oral TID WC  . FLUoxetine  10 mg Oral Daily  . gabapentin  600 mg Oral TID  . insulin aspart  0-15 Units Subcutaneous TID WC  . insulin aspart  0-5 Units Subcutaneous QHS  . insulin glargine  36 Units Subcutaneous QHS  . lisinopril  10 mg Oral Daily  . polyethylene glycol  17 g Oral Daily  . senna-docusate  1 tablet Oral QHS  . sodium chloride  3 mL Intravenous Q12H   Continuous Infusions:  PRN Meds:.cyclobenzaprine, diphenhydrAMINE-zinc acetate, docusate sodium, oxyCODONE-acetaminophen, pantoprazole, promethazine Assessment/Plan: Principal Problem:   Cellulitis and abscess of leg Active Problems:   Diabetes mellitus type 2,  uncontrolled, with complications   TOBACCO ABUSE   Essential hypertension   CKD Stage 2   History of left below knee amputation   Anemia, iron deficiency   Cellulitis of left BKA - CT of left knee reveals abscess with post sx medullary defect of tibia vs extension of infxn, Dr. Sharol Given aware and believes this is most likely post sx changes due to recent lt stump revision on 10/16. Wound culture positive for E.coli - daily hydrotherapy, Dr. Sharol Given to see patient today.  - po keflex 500mg  TID - percocet q4h prn for pain control and flexeril 5mg  TID prn for muscle cramping - PT recommend SNF, SW working on SNF, will sign FL2 form today  Diabetes mellitus type 2, uncontrolled, with complications --Patient is prone to hypoglycemia per previous documentation. A1c 9.6 on 10/14. Pt reports feeling hypoglycemic at CBGs in the high  100's - 200's.  - Lantus 36 u daily, can be decreased to 32 if pt continues to have low CBGs  - Mealtime coverage 4U  - Continue SSI-S   Fe deficiency anemia--likely anemia of chronic disease ( Fe low, TIBC low, ferritin 151) - Ferrous sulfate 325 TID - miralax prn for constipation - FOBT needs to be collected  Thrombocytosis-- likely reactive 2/2 to wound infxn.  - will continue to monitor, trending down  Essential hypertension- normotensive - continue Lisinopril 10mg    CKD Stage 2-- Cr 1.45 10/25 improved from baseline of 1.51.  - will continue to monitor  DVT prophylaxis- lovenox   Diet- carb modified ferred at this time, awaiting improvement of current medical problems.   Dispo: d/c to SNF today or tomorrow pending Dr. Jess Barters wound care recommendations.    The patient does have a current PCP [name], MD) and does need an [facility] hospital follow-up appointment after discharge.   The patient does not have transportation limitations that hinder transportation to clinic appointments. .Services Needed at time of discharge: Y = Yes, Blank = No PT:  SNF  OT:   RN:   Equipment:   Other:     LOS: 8 days   Julious Oka, MD 02/08/2014, 1:13 PM

## 2014-02-08 NOTE — Clinical Social Work Psychosocial (Signed)
Clinical Social Work Department BRIEF PSYCHOSOCIAL ASSESSMENT 02/08/2014  Patient:  [name], [name]     Account Number:  [id number]     Admit date:  01/31/2014  Clinical Social Worker:  Hubert Azure  Date/Time:  02/08/2014 07:20 PM  Referred by:  Physician  Date Referred:  02/08/2014 Referred for  SNF Placement  Other - See comment   Other Referral:   Assistance with utilities   Interview type:  Other - See comment Other interview type:   Patient and wife present at bedside.    PSYCHOSOCIAL DATA Living Status:  WIFE Admitted from facility:   Level of care:   Primary support name:  [name] [phone]) Primary support relationship to patient:  SPOUSE Degree of support available:   Good    CURRENT CONCERNS Current Concerns  Post-Acute Placement  Financial Resources   Other Concerns:    SOCIAL WORK ASSESSMENT / PLAN CSW met with patient who was sitting in wheelchair and wife who was present at bedside. CSW introduced self and explained role. CSW discussed current living situation with patient. Per patient, he and wife have been without electricity and water for 7 years. Patient states he and wife have "adapted" to lack of water and electricity as they have incurred a $5,000 Statistician. Patient states the fees have added up since, he and his brother have both turned on the power at his home "illegally" and "racked up" huge fees. Patient states he has been waiting for SSI to be approved, but continues to receive denials in which he has to appeal. Patient reports he does not have an income besides food stamps and wife does not work. Patient's wife report they have a wooden stove outside in which they use to cook and neighbors allow them to get water from their homes, and they also get water from a well. Patient and wife state they are unable to get assistance from churches or organizations, because they do not have financial means to be able to pay the  bills in the future.    Patient reports he prefers HHPT in a motel versus a SNF, because his wife will be able to reside in the motel with him, but at the SNF she will not. Per patient, he received HHPT in the past at a motel with the [employer], but he was arrested for circumstances beyond his control and does not know if he would be eligible for the program again. Per patient, wife will be home all alone in the dark, and although he is in a wheelchair, he still considers himself as "her protector." Patient is agreeable to SNF if unable to receive HHPT in motel. CSW discussed SNF process with patient. Patient and wife are agreeable to patient receiving the care he needs.   Assessment/plan status:  Other - See comment Other assessment/ plan:   CSW to complete FL2 and submit PASARR for SNF placement.   Information/referral to community resources:    PATIENT'S/FAMILY'S RESPONSE TO PLAN OF CARE: Patient and wife were pleasant and cooperative. Patient expressed anxiety with wife being [place] alone [place] with no lights and running water and having to chop wood and build her own fires, if he has to go to a SNF, but he is agreeable as he understands it is for improvement. Patient would prefer to receive HHPT at a motel.   [place], [place] Weekend Clinical Social Worker [phone]

## 2014-02-08 NOTE — Clinical Social Work Placement (Signed)
Clinical Social Work Department CLINICAL SOCIAL WORK PLACEMENT NOTE 02/08/2014  Patient:  [name], [name]  Account Number:  [id number] Admit date:  01/31/2014  Clinical Social Worker:  Carrington Clamp, [place]  Date/time:  02/08/2014 08:54 PM  Clinical Social Work is seeking post-discharge placement for this patient at the following level of care:   [place]   (*CSW will update this form in Epic as items are completed)   02/08/2014  Patient/family provided with [place] Department of Clinical Social Work's list of facilities offering this level of care within the geographic area requested by the patient (or if unable, by the patient's family).  02/08/2014  Patient/family informed of their freedom to choose among providers that offer the needed level of care, that participate in Medicare, Medicaid or managed care program needed by the patient, have an available bed and are willing to accept the patient.  02/08/2014  Patient/family informed of [facility]' ownership interest in [facility], as well as of the fact that they are under no obligation to receive care at this facility.  PASARR submitted to EDS on 02/08/2014 PASARR number received on 02/08/2014  FL2 transmitted to all facilities in geographic area requested by pt/family on  02/08/2014 FL2 transmitted to all facilities within larger geographic area on 02/08/2014  Patient informed that his/her managed care company has contracts with or will negotiate with  certain facilities, including the following:     Patient/family informed of bed offers received:   Patient chooses bed at  Physician recommends and patient chooses bed at    Patient to be transferred to  on   Patient to be transferred to facility by  Patient and family notified of transfer on  Name of family member notified:    The following physician request were entered in Epic:   Additional Comments:  [place],  [place] Weekend Clinical Social Worker [phone]

## 2014-02-09 LAB — CBC
HCT: 24.1 % — ABNORMAL LOW (ref 39.0–52.0)
Hemoglobin: 7.6 g/dL — ABNORMAL LOW (ref 13.0–17.0)
MCH: 28 pg (ref 26.0–34.0)
MCHC: 31.5 g/dL (ref 30.0–36.0)
MCV: 88.9 fL (ref 78.0–100.0)
PLATELETS: 825 10*3/uL — AB (ref 150–400)
RBC: 2.71 MIL/uL — AB (ref 4.22–5.81)
RDW: 13.9 % (ref 11.5–15.5)
WBC: 8.1 10*3/uL (ref 4.0–10.5)

## 2014-02-09 LAB — GLUCOSE, CAPILLARY
GLUCOSE-CAPILLARY: 175 mg/dL — AB (ref 70–99)
GLUCOSE-CAPILLARY: 154 mg/dL — AB (ref 70–99)
GLUCOSE-CAPILLARY: 191 mg/dL — AB (ref 70–99)
Glucose-Capillary: 155 mg/dL — ABNORMAL HIGH (ref 70–99)
Glucose-Capillary: 331 mg/dL — ABNORMAL HIGH (ref 70–99)
Glucose-Capillary: 97 mg/dL (ref 70–99)
Glucose-Capillary: 260 mg/dL — ABNORMAL HIGH (ref 70–99)

## 2014-02-09 MED ORDER — OXYCODONE-ACETAMINOPHEN 5-325 MG PO TABS: 1.0000 | ORAL_TABLET | ORAL | Status: DC | PRN

## 2014-02-09 MED ORDER — CEPHALEXIN 500 MG PO CAPS: 500.0000 mg | ORAL_CAPSULE | Freq: Three times a day (TID) | ORAL | Status: DC

## 2014-02-09 MED ORDER — FERROUS SULFATE 325 (65 FE) MG PO TABS: 325.0000 mg | ORAL_TABLET | Freq: Three times a day (TID) | ORAL | Status: DC

## 2014-02-09 MED ORDER — DSS 100 MG PO CAPS: 100.0000 mg | ORAL_CAPSULE | Freq: Every day | ORAL | Status: DC | PRN

## 2014-02-09 NOTE — Plan of Care (Signed)
Problem: Phase III Progression Outcomes Goal: Discharge plan remains appropriate-arrangements made Outcome: Progressing     

## 2014-02-09 NOTE — Clinical Social Work Note (Signed)
CSW met with patient at length (approximately one hour). CSW spoke to patient about SNF placement options and how [place]. Patient has a bed offer from [facility]. Patient states that he would much rather receive outpatient wound care at [facility] as he has done this in the past. Patient states that he uses Medicaid transport to get to his appointments and would just need to know when his first appointment will. CSW explained that Sharol Given will be by to make his recommendations. CSW has updated RNCM and MD in regards to patient's request. If patient cannot be managed by [facility] wound clinic then he does have a bed at [facility] as long as hydrotherapy is discharged.   Liz Beach MSW, [place], [place], [phone]

## 2014-02-09 NOTE — Progress Notes (Signed)
Patient ID: [name], male   DOB: 1965/05/25, 48 y.o.   MRN: [id number] Cellulitis and drainage improving left transtibial amputation. I feel patient would be safe to be discharged with wound care dressing changes at the wound center. I will follow-up in the office next week.  Discussed proper nutrition and diet. Patient expressed no interest in proper diet.

## 2014-02-09 NOTE — Progress Notes (Signed)
Discharge instructions given to pt.by Pattie RN ;including activity,diet,ffup appts.& meds.Patient verbalized understanding on all d/c instructions .IV access was dcd.V/S stable Skin - no sores or suspicious lesions or rashes or color changes is intact the patient.to be escorted out by NT.to be driven home by family.

## 2014-02-09 NOTE — Progress Notes (Signed)
Physical Therapy Wound Treatment Patient Details  Name: [name] MRN: [id number] Date of Birth: 09-22-1965  Today's Date: 02/09/2014 Time: 1448-1856 Time Calculation (min): 25 min  Subjective  Subjective: I guess it'll be best to go on to the nursing home for a short time.  My wife is hot. Patient and Family Stated Goals: I need these healed up so I can get my leg and start walking  Pain Score: Pain Score: 6   Wound Assessment  Wound / Incision (Open or Dehisced) 02/05/14 Other (Comment) Leg Left 3 openings to closure of L transtibial amp (Active)  Dressing Type Gauze (Comment);ABD 02/09/2014 10:31 AM  Dressing Changed Changed 02/08/2014  6:00 PM  Dressing Status Intact;Clean;Dry 02/09/2014 10:31 AM  Site / Wound Assessment Bleeding;Pink 02/09/2014 10:31 AM  % Wound base Red or Granulating 100% 02/09/2014 10:31 AM  % Wound base Yellow 0% 02/09/2014 10:31 AM  % Wound base Black 0% 02/09/2014 10:31 AM  % Wound base Other (Comment) 0% 02/09/2014 10:31 AM  Peri-wound Assessment Intact 02/09/2014 10:31 AM  Wound Length (cm) 0.5 cm 02/05/2014 11:00 AM  Wound Width (cm) 1 cm 02/05/2014 11:00 AM  Wound Depth (cm) 4 cm 02/05/2014 11:00 AM  Drainage Amount Copious 02/09/2014 10:31 AM  Drainage Description Serosanguineous;Purulent 02/09/2014 10:31 AM  Treatment Cleansed;Debridement (Selective);Hydrotherapy (Pulse lavage);Packing (Impregnated strip) 02/09/2014 10:31 AM     Incision (Closed) 01/23/14 Leg Left (Active)  Dressing Type Gauze (Comment) 02/09/2014  8:54 AM  Dressing Dry;Intact 02/09/2014  8:54 AM  Site / Wound Assessment Dressing in place / Unable to assess 02/09/2014  8:54 AM  Incision Length (cm) 22 cm 01/31/2014  3:53 PM  Margins Attached edges (approximated) 02/05/2014  7:15 AM  Closure None 02/05/2014  7:15 AM  Drainage Amount Moderate 02/05/2014  7:15 AM  Drainage Description Serosanguineous 02/05/2014  7:15 AM  Treatment Cleansed 01/31/2014  3:53 PM   Hydrotherapy Pulsed lavage  therapy - wound location: Left leg incision Pulsed Lavage with Suction (psi): 4 psi Pulsed Lavage with Suction - Normal Saline Used: 1000 mL Pulsed Lavage Tip: Tunneling tip Selective Debridement Selective Debridement - Location: Transtib incision Left Selective Debridement - Tools Used: Forceps Selective Debridement - Tissue Removed: slough   Wound Assessment and Plan  Wound Therapy - Assess/Plan/Recommendations Wound Therapy - Clinical Statement: PLS for irrigation, minor selective debridement and wick dressing will benefit this wound to reduce purulence and prepare for closure Wound Therapy - Functional Problem List: bil Transtibial amputations limit function; wound prevents use of prosthesis Factors Delaying/Impairing Wound Healing: Infection - systemic/local Hydrotherapy Plan: Debridement;Dressing change;Patient/family education;Pulsatile lavage with suction Wound Therapy - Frequency: 6X / week Wound Therapy - Follow Up Recommendations: [name] Wound Plan: see above  Wound Therapy Goals- Improve the function of patient's integumentary system by progressing the wound(s) through the phases of wound healing (inflammation - proliferation - remodeling) by: Decrease Necrotic Tissue to: 0 Decrease Necrotic Tissue - Progress: Met Increase Granulation Tissue to: 100 Increase Granulation Tissue - Progress: Met Improve Drainage Characteristics: Serous;Other (comment) (free of purulence) Improve Drainage Characteristics - Progress: Progressing toward goal Time For Goal Achievement: 7 days Wound Therapy - Potential for Goals: Good  Goals will be updated until maximal potential achieved or discharge criteria met.  Discharge criteria: when goals achieved, discharge from hospital, MD decision/surgical intervention, no progress towards goals, refusal/missing three consecutive treatments without notification or medical reason.  GP     Swara Donze, Tessie Fass 02/09/2014, 10:34  AM 02/09/2014  Donnella Sham,  PT [phone]  (pager)

## 2014-02-09 NOTE — Discharge Instructions (Signed)
Follow-up with the wound center for wound care. Wash leg with soap and water and apply dry dressing daily.   1. We will call you to discuss your follow up:  Newt Minion In 1 week  [place] [address] [phone]  Ricard Dillon  On 02/19/2014 9 am at wound center at [facility]. [address] [place] [address] [phone]   2. Please take all medications as prescribed.   3. If you have worsening of your symptoms or new symptoms arise, please call the clinic ([phone]), or go to the ER immediately if symptoms are severe.

## 2014-02-09 NOTE — Progress Notes (Signed)
Inpatient Diabetes Program Recommendations  AACE/ADA: New Consensus Statement on Inpatient Glycemic Control (2013)  Target Ranges:  Prepandial:   less than 140 mg/dL      Peak postprandial:   less than 180 mg/dL (1-2 hours)      Critically ill patients:  140 - 180 mg/dL     Results for YOSHITO, GAZA (MRN [id number]) as of 02/09/2014 08:47  Ref. Range 02/08/2014 22:52 02/09/2014 01:27 02/09/2014 04:53 02/09/2014 07:58  Glucose-Capillary Latest Range: 70-99 mg/dL 182 (H) 331 (H) 154 (H) 97     MD- Please change Novolog Sensitive SSI to tid ac + HS (currently ordered as Q4 hour coverage and patient is eating PO diet)    Will follow Wyn Quaker RN, MSN, CDE Diabetes Coordinator Inpatient Diabetes Program Team Pager: [phone] (8a-10p)

## 2014-02-09 NOTE — Progress Notes (Signed)
Physical Therapy Treatment Patient Details Name: [name] MRN: [id number] DOB: July 06, 1965 Today's Date: 02/09/2014    History of Present Illness JAHAAN [name] is a 48 y.o. 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 He was admitted on 10/14 for fevers, leukocytosis, redness/pain and drainage from incision site of L-BKA. He underwent revision/IX D on 10/16, no OR cultures sent and not discharged home on antibiotics. Initially doing well after being discharged but again started to have fevers, chills, erythema, pain and drainage from incision site. He was readmitted on 10/24 with temp of 101F x 2 days, pain to stump, drainage and found to have leukocytosis of 14K. We was started again on vancomycin and piptazo. Blood cx ngtd. Evaluated by Dr. Sharol Given who felt no need for further surgery. He still is having sanginous purulent drainage from his leg. Hx of Rt BKA.    PT Comments    Pt admitted with above. Pt currently with functional limitations due to strength deficits.  Pt very independent performing transfers without PT assist or cues.  Making sure pt confident with LE exercises and then will sign off.  MD:  Pt needs care management in hospital to call [facility] to arrange for amputee pad for legrest for left LE as well as a shrinker for right LE.  These things need to be arranged prior to pt leaving hospital.    Pt will benefit from skilled PT to increase their independence and safety with mobility to allow discharge to the venue listed below.   Follow Up Recommendations  Home health PT;Supervision/Assistance - 24 hour     Equipment Recommendations  Other (comment) (needs amputee pad for left LE legrest, right LE shrinker)    Recommendations for Other Services       Precautions / Restrictions Precautions Precautions: Fall Restrictions Weight Bearing Restrictions: No Other Position/Activity Restrictions:  bilateral BKAs    Mobility  Bed Mobility Overal bed mobility: Modified Independent             General bed mobility comments: manages up to long sitting independently   Transfers Overall transfer level: Needs assistance Equipment used: None Transfers: Anterior-Posterior Transfer;Lateral/Scoot Transfers       Anterior-Posterior transfers: Modified independent (Device/Increase time)  Lateral/Scoot Transfers: Modified independent (Device/Increase time) General transfer comment: Good safety and smooth transfer execution.  Ambulation/Gait                 Hotel manager mobility: Yes Wheelchair propulsion: Both upper extremities Wheelchair parts: Independent Distance: 500 Wheelchair Assistance Details (indicate cue type and reason): safe with manipulation of wheelchair  Modified Rankin (Stroke Patients Only)       Balance Overall balance assessment: No apparent balance deficits (not formally assessed) Sitting-balance support: No upper extremity supported;Feet supported Sitting balance-Leahy Scale: Good                              Cognition Arousal/Alertness: Awake/alert Behavior During Therapy: WFL for tasks assessed/performed Overall Cognitive Status: Within Functional Limits for tasks assessed                      Exercises Amputee Exercises Hip Extension: AROM;10 reps;Left;Sidelying Hip ABduction/ADduction: AROM;Both;10 reps;Sidelying Knee Flexion: AROM;Both;10 reps;Seated Knee Extension: AROM;Both;10 reps;Supine    General Comments  General comments (skin integrity, edema, etc.): left LE wrapped with bandage.        Pertinent Vitals/Pain Pain Assessment: No/denies pain  VSS    Home Living                      Prior Function            PT Goals (current goals can now be found in the care plan section) Progress towards PT goals: Progressing toward  goals    Frequency  Min 3X/week    PT Plan Current plan remains appropriate    Co-evaluation             End of Session   Activity Tolerance: Patient tolerated treatment well Patient left: in bed;with call bell/phone within reach;with family/visitor present     Time: 1335-1359 PT Time Calculation (min): 24 min  Charges:  $Therapeutic Exercise: 8-22 mins $Wheel Chair Management: 8-22 mins                    G Codes:      Denice Paradise 02-17-14, 2:39 PM [facility] [phone] (pager)

## 2014-02-09 NOTE — Clinical Social Work Note (Signed)
FL2 on chart for MD signature.   Bryant Ondria Oswald MSW, LCSWA, LCASA, [phone] 

## 2014-02-10 NOTE — Progress Notes (Signed)
Called pt and spoke with wife at 3:20pm today who takes care of his lt BKA wound for him. She is going to call the wound care clinic today to make an appt before November 12th. She spoke with Golden Hurter who will arrange transportation for the patient to and from wound clinic once they make their initial appt at the wound clinic. Spoke with Dr. Jess Barters assistant today. Per Dr. Sharol Given pt to do daily dry dressing w/ dial soap until his appt w/ Dr. Sharol Given on the 12th at 10:30am if they are unable to see wound care clinic before then. Informed wife of this who verbalized understanding.   Julious Oka, MD

## 2014-02-10 NOTE — Discharge Summary (Signed)
Name: [name] MRN: [id number] DOB: 07/20/1965 48 y.o. PCP: [name], MD  Date of Admission: 01/31/2014  9:14 AM Date of Discharge: 02/10/2014 Attending Physician: No att. providers found  Discharge Diagnosis: Principal Problem:   Cellulitis and abscess of leg Active Problems:   Diabetes mellitus type 2, uncontrolled, with complications   TOBACCO ABUSE   Essential hypertension   CKD Stage 2   History of left below knee amputation   Anemia, iron deficiency  Discharge Medications:   Medication List    TAKE these medications        cephALEXin 500 MG capsule  Commonly known as:  KEFLEX  Take 1 capsule (500 mg total) by mouth every 8 (eight) hours.  Notes to Patient:  6am and 2pm.     diphenhydramine-acetaminophen 25-500 MG Tabs  Commonly known as:  TYLENOL PM  Take 1 tablet by mouth at bedtime as needed.     DSS 100 MG Caps  Take 100 mg by mouth daily as needed for mild constipation.     ferrous sulfate 325 (65 FE) MG tablet  Take 1 tablet (325 mg total) by mouth 3 (three) times daily with meals.     FLUoxetine 10 MG capsule  Commonly known as:  PROZAC  Take 10 mg by mouth daily.     gabapentin 600 MG tablet  Commonly known as:  NEURONTIN  Take 600 mg by mouth 3 (three) times daily.     insulin aspart 100 UNIT/ML injection  Commonly known as:  novoLOG  Inject 1-10 Units into the skin 3 (three) times daily before meals.     insulin glargine 100 UNIT/ML injection  Commonly known as:  LANTUS  Inject 32 Units into the skin at bedtime.     lisinopril 10 MG tablet  Commonly known as:  PRINIVIL,ZESTRIL  Take 1 tablet (10 mg total) by mouth daily.     oxyCODONE-acetaminophen 5-325 MG per tablet  Commonly known as:  PERCOCET/ROXICET  Take 1-2 tablets by mouth every 4 (four) hours as needed for moderate pain or severe pain.     pantoprazole 40 MG tablet  Commonly known as:  PROTONIX  Take 40 mg by mouth daily as needed (for acid reflux).         Disposition and follow-up:   [name] was discharged from [facility] in [place] condition.  At the hospital follow up visit please address:  1.  Please ensure compliance with wound care and antibiotics.   2.  Labs / imaging needed at time of follow-up: CBC, FOBT  3.  Pending labs/ test needing follow-up: none  Follow-up Appointments:     Follow-up Information    Follow up with DUDA,MARCUS V, MD In 1 week.   Specialty:  Orthopedic Surgery   Contact information:   [place] [address] [phone]       Follow up with Cyndee Brightly, MD On 02/19/2014.   Specialty:  Internal Medicine   Why:  9 am at wound center at [facility] information:   [place] [place] [address] [phone]       Discharge Instructions: Discharge Instructions    Diet - low sodium heart healthy    Complete by:  As directed      Increase activity slowly    Complete by:  As directed            Consultations:  Orthopedics,  Infectious Disease, Wound Care, Physical Therapy  Procedures Performed:  Dg Knee 2 Views Left  01/31/2014   CLINICAL DATA:  Fever. Recent diagnosis and treatment for osteomyelitis.  EXAM: LEFT KNEE - 1-2 VIEW  COMPARISON:  01/21/2014  FINDINGS: Since prior study, there has been additional resection of the tibial and fibular stumps. There is significant gas in the overlying soft tissues, which may all be postoperative in origin. Soft tissue infection with gas producing bacteria, however, is also possible. There are no radiopaque foreign bodies in the soft tissues.  There are no areas of bone resorption to suggest residual osteomyelitis.  IMPRESSION: No radiographic evidence of residual osteomyelitis. Significant soft tissue air in the distal amputated left leg soft tissues, deep to the surgical skin staples. This could all be postoperative in origin. Soft tissue infection is possible.    Electronically Signed   By: Lajean Manes M.D.   On: 01/31/2014 11:29   Dg Knee 1-2 Views Left  01/21/2014   CLINICAL DATA:  Evaluate for osteomyelitis of leg stump.  EXAM: LEFT KNEE - 1-2 VIEW  COMPARISON:  None.  FINDINGS: Status post transtibial amputation 12/12/2013. Subcutaneous gas is present at the level of the stump, unexpected this remote from surgery. There is mild fragmentation and irregularity of the tibial osteotomy margin, which could be residual from recent osteotomy. This finding does not allow exclusion of early osteomyelitis. The knee joint is unremarkable for age.  IMPRESSION: Subcutaneous gas in the amputation stump could be from ulceration or necrotizing infection. Minimal irregularity of the tibial osteotomy may be related to recent surgery, but cannot exclude osteomyelitis.   Electronically Signed   By: Jorje Guild M.D.   On: 01/21/2014 21:36   Ct Knee Left Wo Contrast  02/03/2014   CLINICAL DATA:  Left lower extremity amputation. A clinical concern for infection.  EXAM: CT OF THE LEFT KNEE WITHOUT CONTRAST  TECHNIQUE: Multidetector CT imaging of the LEFT knee was performed according to the standard protocol. Multiplanar CT image reconstructions were also generated.  COMPARISON:  None.  FINDINGS: The patient is status post revision amputation on 01/23/2014.  There is below-the-knee amputation. There is a complex fluid collection measuring approximately 7 x 3 x 4 cm at the distal stump with multiple foci of air within the collection most concerning for an abscess. The abscess abuts the distal tibial stump. There is no cortical erosion. There is a medullary defect of the distal tibial diaphysis which may be postsurgical versus extension of infection.  There is no other focal fluid collection. There is no fracture or dislocation. There is a small joint effusion.  IMPRESSION: 1. Left below-the-knee amputation. Complex fluid collection measuring 7 x 3 x 4 cm at the distal stump with  multiple foci of air within the collection most concerning for an abscess which abuts the distal tibial stump. No definite cortical erosion. Medullary defect of the central distal tibial diaphysis which may be postsurgical versus extension of infection.   Electronically Signed   By: Kathreen Devoid   On: 02/03/2014 13:41    Admission HPI: Pt is a 47 y/o male w/PMHx of HTN, DM, CKD2, GERD, and b/l BKA who presents w/ left leg pain. He had a lt stump revision w/ Dr. Sharol Given due to osteomyelitis on 10/16 and discharged home on 10/18 w/o abx. Pt started having fevers, NS, and chills that he attributed to constipation after his discharge on 10/18. He was able to have a bowel movement last night and  sx did not get better. He then went to the ED at which time a left knee xray revealed possible soft tissue infection.   Hospital Course by problem list: Principal Problem:   Cellulitis and abscess of leg Active Problems:   Diabetes mellitus type 2, uncontrolled, with complications   TOBACCO ABUSE   Essential hypertension   CKD Stage 2   History of left below knee amputation   Anemia, iron deficiency   Cellulitis of left BKA Patient taken to OR 01/23/14 by Dr. Sharol Given for debridement of left BKA and discharged 2 days later w/o abx. Elevated ESR at 131, WBCs 14,and pt tachycardic on admission. Left knee xray revealed soft tissue air in the distal amputated lt leg deep to the surgical skin staples which could represent soft tissue infxn. Started on IV vanc and zosyn which was transitioned to keflex 55m TID x 4 weeks after wound culture came back positive for E.coli. Dr. DSharol Givenperformed bedside debridement of wound w/ wicking. Daily hydrotherapy was started. On day of d/c Dr. DSharol [place] patient and recommended wound care for dressing changes. [facility]   Diabetes mellitus type 2, uncontrolled, with complications --Patient is prone to hypoglycemia per previous documentation. A1c  9.6 on 10/14. Given Lantus 32 units QHS w/ 4units mealtime coverage and SSI-sensitive. Continued home gabapentin 600 mg TID. During course pt had elevated CBGs and lantus was increased to lantus 36 units qhs w/ 5 units mealtime coverage. Pt had a hypoglycemic episode w/ CBG of 65 and felt shaky. CBG was rechecked ad was 80. Mealtime coverage decreased to 4units.   Anemia of Chronic disease--w concomitant Fe deficiency. Iron <10, TIBC to low to calculate, ferritin 151. Started on ferrous sulfate 325 mg TID. Would recommend to check FOBT as outpatient.   Discharge Vitals:   BP 160/93 mmHg  Pulse 105  Temp(Src) 97.9 F (36.6 C) (Oral)  Resp 20  Ht 5' 7" (1.702 m)  Wt 205 lb 0.4 oz (93 kg)  BMI 32.10 kg/m2  SpO2 96%  Discharge Labs:  Results for orders placed or performed during the hospital encounter of 01/31/14 (from the past 24 hour(s))  Glucose, capillary     Status: Abnormal   Collection Time: 02/09/14 12:01 PM  Result Value Ref Range   Glucose-Capillary 260 (H) 70 - 99 mg/dL  Glucose, capillary     Status: Abnormal   Collection Time: 02/09/14  4:52 PM  Result Value Ref Range   Glucose-Capillary 191 (H) 70 - 99 mg/dL    Signed: DJulious Oka MD 02/10/2014, 11:36 AM    Services Ordered on Discharge: none Equipment Ordered on Discharge: none

## 2014-02-16 ENCOUNTER — Telehealth: Payer: Self-pay | Admitting: Licensed Clinical Social Worker

## 2014-02-16 NOTE — Telephone Encounter (Signed)
Ms. Vea placed call to [facility] stating pt had been discharged and has two follow up appointments. Mrs. Feltz requesting assistance scheduling transportation through [facility].  Notification sent to [facility] for transportation for 11/12 appointment and 11/19 appointment.

## 2014-02-17 NOTE — Telephone Encounter (Signed)
Transportation arranged: 12th be ready 7:55 to 8:35. For the 19th 1:25 to 2:05.

## 2014-02-19 ENCOUNTER — Encounter (HOSPITAL_BASED_OUTPATIENT_CLINIC_OR_DEPARTMENT_OTHER): Payer: Medicaid Other | Attending: Internal Medicine

## 2014-02-19 DIAGNOSIS — Y835 Amputation of limb(s) as the cause of abnormal reaction of the patient, or of later complication, without mention of misadventure at the time of the procedure: Secondary | ICD-10-CM | POA: Diagnosis not present

## 2014-02-19 DIAGNOSIS — L02416 Cutaneous abscess of left lower limb: Secondary | ICD-10-CM | POA: Diagnosis not present

## 2014-02-19 DIAGNOSIS — T8789 Other complications of amputation stump: Secondary | ICD-10-CM | POA: Insufficient documentation

## 2014-02-19 DIAGNOSIS — Z899 Acquired absence of limb, unspecified: Secondary | ICD-10-CM | POA: Insufficient documentation

## 2014-02-19 LAB — GLUCOSE, CAPILLARY: Glucose-Capillary: 113 mg/dL — ABNORMAL HIGH (ref 70–99)

## 2014-02-20 NOTE — Progress Notes (Signed)
[facility]  [name], [name]:  [id number]  MEDICAL RECORD NO.:  [id number]      DATE OF BIRTH:  23-Mar-1966  PHYSICIAN:  Judene Companion, M.D.      VISIT DATE:  02/19/2014                                  OFFICE VISIT   [name] is a 48 year old African American male who 2 years ago had a right below-knee amputation and just a month ago had a left below-knee amputation for persistent peripheral vascular disease and gangrenous changes of his feet.  He comes to [place] because he has an open wound on the medial side of his below-knee amputation stump and on investigating this, there is an actual abscess cavity that is about 6 cm into the wound going towards the stump of the tibia.  I drained this and we put iodoform in the wound and he has already been on antibiotics, but I went ahead and did a culture to make sure he is on an appropriate antibiotic. I think in light of him having a delayed healing of his flap, I think it would benefit him to continue antibiotics and because this has been going on for about a month without evidence of healing the below-knee amputation site, I think hyperbaric oxygen may very well help heal this flap, therefore we are applying for hyperbaric oxygen treatments for a failed flap.  In the meantime, he will continue the antibiotics.  This man is on insulin for his diabetes, which usually controls him very well.  Today, his blood sugar was 180.  The patient will be offloaded today and I am packing the wound with Iodoform.  We will have the home health nurses change the dressing and repack it 3 times a week.  He will return here in a week.  While he was here, he was found to have a temperature of 98, his blood pressure was 152/100, this man weighs 205 pounds.  DIAGNOSES: 1. Peripheral vascular disease. 2. Type 2 diabetes. 3. Post bilateral below-knee amputation with an abscess in the left     side with delayed  flap.     Judene Companion, M.D.     PP/MEDQ  D:  02/19/2014  T:  02/20/2014  Job:  [id number]

## 2014-02-23 NOTE — Telephone Encounter (Signed)
Add'l appointment set up for 11/19 for wound clinic: transportation pick up 9:10-9:50am.

## 2014-02-26 ENCOUNTER — Inpatient Hospital Stay: Payer: Medicaid Other | Admitting: Internal Medicine

## 2014-02-26 DIAGNOSIS — Z899 Acquired absence of limb, unspecified: Secondary | ICD-10-CM | POA: Diagnosis not present

## 2014-02-26 DIAGNOSIS — L02416 Cutaneous abscess of left lower limb: Secondary | ICD-10-CM | POA: Diagnosis not present

## 2014-02-26 DIAGNOSIS — T8789 Other complications of amputation stump: Secondary | ICD-10-CM | POA: Diagnosis not present

## 2014-02-26 NOTE — Progress Notes (Signed)
[facility]  NAMECHISTOPHER, MANGINO NO.:  [id number]  MEDICAL RECORD NO.:  [id number]      DATE OF BIRTH:  Oct 10, 1965  PHYSICIAN:  Judene Companion, M.D.           VISIT DATE:                                  OFFICE VISIT   [name] is a 48 year old gentleman who suffers from type 1 diabetes mellitus.  He unfortunately has had great problems with peripheral vascular disease which has resulted in bilateral below-knee amputations because of continued problems with gangrene and osteomyelitis.  He most recently underwent a below-knee amputation 4 months ago for continuing gangrenous changes of his left leg.  He postoperatively developed drainage and an abscess in the scar of his left below-knee amputation which required drainage and has since sloughed some of the flap, so we are asking Medicaid to allow [place] to treat him with hyperbaric oxygen treatments for treatment of his delayed flap along with [place] putting on collagen dressings and antibiotics. The culture of this wound was Staph aureus, sensitive to doxycycline.  I feel that it would be of great service to [name] to utilize hyperbaric oxygen treatments to treat this failed flap.  I would say it is medical necessity to clear the problems with his nonhealing surgical wound.     Judene Companion, M.D.     PP/MEDQ  D:  02/26/2014  T:  02/26/2014  Job:  [id number]

## 2014-02-27 ENCOUNTER — Telehealth: Payer: Self-pay | Admitting: Licensed Clinical Social Worker

## 2014-02-27 NOTE — Telephone Encounter (Signed)
Pt requesting CSW assistance for upcoming appointments.  CSW sent requesting for 03/12/14 - [facility] appointments.  Additional appointment are beyond 2 weeks.  Will need schedule at later time.

## 2014-03-02 NOTE — Telephone Encounter (Signed)
Transportation set up/response.  Tell him to be ready 7:40 to 8:20. I put a 45 minute appointment for that one. For the 2nd appointment be ready 11:25 to 12:05. If the first one runs long he would have to go one to the other.  Pt notified.

## 2014-03-12 ENCOUNTER — Telehealth: Payer: Self-pay | Admitting: Licensed Clinical Social Worker

## 2014-03-12 ENCOUNTER — Encounter (HOSPITAL_BASED_OUTPATIENT_CLINIC_OR_DEPARTMENT_OTHER): Payer: Medicaid Other | Attending: Internal Medicine

## 2014-03-12 DIAGNOSIS — E11622 Type 2 diabetes mellitus with other skin ulcer: Secondary | ICD-10-CM | POA: Diagnosis not present

## 2014-03-12 DIAGNOSIS — L02214 Cutaneous abscess of groin: Secondary | ICD-10-CM | POA: Diagnosis present

## 2014-03-12 DIAGNOSIS — X58XXXD Exposure to other specified factors, subsequent encounter: Secondary | ICD-10-CM | POA: Insufficient documentation

## 2014-03-12 DIAGNOSIS — T8744 Infection of amputation stump, left lower extremity: Secondary | ICD-10-CM | POA: Diagnosis present

## 2014-03-12 DIAGNOSIS — B9689 Other specified bacterial agents as the cause of diseases classified elsewhere: Secondary | ICD-10-CM | POA: Diagnosis not present

## 2014-03-12 DIAGNOSIS — F17200 Nicotine dependence, unspecified, uncomplicated: Secondary | ICD-10-CM | POA: Insufficient documentation

## 2014-03-12 DIAGNOSIS — L97822 Non-pressure chronic ulcer of other part of left lower leg with fat layer exposed: Secondary | ICD-10-CM | POA: Insufficient documentation

## 2014-03-12 NOTE — Telephone Encounter (Signed)
CSW received call from Mrs. Schaff stating pt has been approved for ongoing care at wound care and will need transportation set up for Monday - Friday, 9:30-11:30 for the next 30 days.  CSW sent request for 03/16/14 - 03/27/14 Monday - Friday, as transportation schedule can only be made 2 weeks in advance.  However, transportation notified this will be a need for 6 weeks.

## 2014-03-13 ENCOUNTER — Other Ambulatory Visit: Payer: Self-pay | Admitting: Internal Medicine

## 2014-03-16 DIAGNOSIS — L02214 Cutaneous abscess of groin: Secondary | ICD-10-CM | POA: Diagnosis not present

## 2014-03-16 DIAGNOSIS — T8744 Infection of amputation stump, left lower extremity: Secondary | ICD-10-CM | POA: Diagnosis not present

## 2014-03-16 DIAGNOSIS — E11622 Type 2 diabetes mellitus with other skin ulcer: Secondary | ICD-10-CM | POA: Diagnosis not present

## 2014-03-16 DIAGNOSIS — L97822 Non-pressure chronic ulcer of other part of left lower leg with fat layer exposed: Secondary | ICD-10-CM | POA: Diagnosis not present

## 2014-03-16 LAB — GLUCOSE, CAPILLARY
GLUCOSE-CAPILLARY: 244 mg/dL — AB (ref 70–99)
GLUCOSE-CAPILLARY: 285 mg/dL — AB (ref 70–99)

## 2014-03-16 NOTE — Telephone Encounter (Signed)
Transportation has set a standing order.  Mr. Bangura will not need to call those in as long as the days and times stays the same.  He will call cancellations or when he is thru going there.  Pt notified.

## 2014-03-17 DIAGNOSIS — T8744 Infection of amputation stump, left lower extremity: Secondary | ICD-10-CM | POA: Diagnosis not present

## 2014-03-17 DIAGNOSIS — E11622 Type 2 diabetes mellitus with other skin ulcer: Secondary | ICD-10-CM | POA: Diagnosis not present

## 2014-03-17 DIAGNOSIS — L02214 Cutaneous abscess of groin: Secondary | ICD-10-CM | POA: Diagnosis not present

## 2014-03-17 DIAGNOSIS — L97822 Non-pressure chronic ulcer of other part of left lower leg with fat layer exposed: Secondary | ICD-10-CM | POA: Diagnosis not present

## 2014-03-17 LAB — GLUCOSE, CAPILLARY
GLUCOSE-CAPILLARY: 97 mg/dL (ref 70–99)
GLUCOSE-CAPILLARY: 119 mg/dL — AB (ref 70–99)
Glucose-Capillary: 226 mg/dL — ABNORMAL HIGH (ref 70–99)

## 2014-03-18 ENCOUNTER — Telehealth: Payer: Self-pay | Admitting: Licensed Clinical Social Worker

## 2014-03-18 DIAGNOSIS — L97822 Non-pressure chronic ulcer of other part of left lower leg with fat layer exposed: Secondary | ICD-10-CM | POA: Diagnosis not present

## 2014-03-18 DIAGNOSIS — L02214 Cutaneous abscess of groin: Secondary | ICD-10-CM | POA: Diagnosis not present

## 2014-03-18 DIAGNOSIS — T8744 Infection of amputation stump, left lower extremity: Secondary | ICD-10-CM | POA: Diagnosis not present

## 2014-03-18 DIAGNOSIS — E11622 Type 2 diabetes mellitus with other skin ulcer: Secondary | ICD-10-CM | POA: Diagnosis not present

## 2014-03-18 LAB — GLUCOSE, CAPILLARY
Glucose-Capillary: 208 mg/dL — ABNORMAL HIGH (ref 70–99)
Glucose-Capillary: 203 mg/dL — ABNORMAL HIGH (ref 70–99)

## 2014-03-18 NOTE — Telephone Encounter (Signed)
Spouse requesting transportation scheduling assistance for pt's appointment on 12/17 at 3:15 with Dr. Sharol Given.  CSW will send email to transportation.  Request sent, pt notified.

## 2014-03-19 DIAGNOSIS — E11622 Type 2 diabetes mellitus with other skin ulcer: Secondary | ICD-10-CM | POA: Diagnosis not present

## 2014-03-19 DIAGNOSIS — L97822 Non-pressure chronic ulcer of other part of left lower leg with fat layer exposed: Secondary | ICD-10-CM | POA: Diagnosis not present

## 2014-03-19 DIAGNOSIS — L02214 Cutaneous abscess of groin: Secondary | ICD-10-CM | POA: Diagnosis not present

## 2014-03-19 DIAGNOSIS — T8744 Infection of amputation stump, left lower extremity: Secondary | ICD-10-CM | POA: Diagnosis not present

## 2014-03-19 LAB — GLUCOSE, CAPILLARY
GLUCOSE-CAPILLARY: 261 mg/dL — AB (ref 70–99)
Glucose-Capillary: 360 mg/dL — ABNORMAL HIGH (ref 70–99)

## 2014-03-19 NOTE — Telephone Encounter (Signed)
Request for transportation to 12/14 [facility] placed, pt notified.

## 2014-03-20 DIAGNOSIS — E11622 Type 2 diabetes mellitus with other skin ulcer: Secondary | ICD-10-CM | POA: Diagnosis not present

## 2014-03-20 DIAGNOSIS — L02214 Cutaneous abscess of groin: Secondary | ICD-10-CM | POA: Diagnosis not present

## 2014-03-20 DIAGNOSIS — L97822 Non-pressure chronic ulcer of other part of left lower leg with fat layer exposed: Secondary | ICD-10-CM | POA: Diagnosis not present

## 2014-03-20 DIAGNOSIS — T8744 Infection of amputation stump, left lower extremity: Secondary | ICD-10-CM | POA: Diagnosis not present

## 2014-03-20 LAB — GLUCOSE, CAPILLARY
GLUCOSE-CAPILLARY: 286 mg/dL — AB (ref 70–99)
Glucose-Capillary: 258 mg/dL — ABNORMAL HIGH (ref 70–99)

## 2014-03-23 ENCOUNTER — Encounter: Payer: Self-pay | Admitting: Internal Medicine

## 2014-03-23 ENCOUNTER — Ambulatory Visit (INDEPENDENT_AMBULATORY_CARE_PROVIDER_SITE_OTHER): Payer: Medicaid Other | Admitting: Internal Medicine

## 2014-03-23 VITALS — BP 159/91 | HR 99 | Temp 98.1°F | Ht 67.0 in

## 2014-03-23 DIAGNOSIS — D509 Iron deficiency anemia, unspecified: Secondary | ICD-10-CM

## 2014-03-23 DIAGNOSIS — I10 Essential (primary) hypertension: Secondary | ICD-10-CM

## 2014-03-23 DIAGNOSIS — L02214 Cutaneous abscess of groin: Secondary | ICD-10-CM | POA: Diagnosis not present

## 2014-03-23 DIAGNOSIS — Z89512 Acquired absence of left leg below knee: Secondary | ICD-10-CM

## 2014-03-23 DIAGNOSIS — Z89511 Acquired absence of right leg below knee: Secondary | ICD-10-CM

## 2014-03-23 DIAGNOSIS — E11622 Type 2 diabetes mellitus with other skin ulcer: Secondary | ICD-10-CM | POA: Diagnosis not present

## 2014-03-23 DIAGNOSIS — IMO0002 Reserved for concepts with insufficient information to code with codable children: Secondary | ICD-10-CM

## 2014-03-23 DIAGNOSIS — E118 Type 2 diabetes mellitus with unspecified complications: Secondary | ICD-10-CM

## 2014-03-23 DIAGNOSIS — E1165 Type 2 diabetes mellitus with hyperglycemia: Secondary | ICD-10-CM

## 2014-03-23 DIAGNOSIS — N182 Chronic kidney disease, stage 2 (mild): Secondary | ICD-10-CM

## 2014-03-23 DIAGNOSIS — L97822 Non-pressure chronic ulcer of other part of left lower leg with fat layer exposed: Secondary | ICD-10-CM | POA: Diagnosis not present

## 2014-03-23 DIAGNOSIS — T8744 Infection of amputation stump, left lower extremity: Secondary | ICD-10-CM | POA: Diagnosis not present

## 2014-03-23 HISTORY — DX: Acquired absence of right leg below knee: Z89.511

## 2014-03-23 LAB — CBC
HEMATOCRIT: 33.1 % — AB (ref 39.0–52.0)
Hemoglobin: 11.1 g/dL — ABNORMAL LOW (ref 13.0–17.0)
MCH: 28.2 pg (ref 26.0–34.0)
MCHC: 33.5 g/dL (ref 30.0–36.0)
MCV: 84 fL (ref 78.0–100.0)
MPV: 9.8 fL (ref 9.4–12.4)
Platelets: 398 10*3/uL (ref 150–400)
RBC: 3.94 MIL/uL — AB (ref 4.22–5.81)
RDW: 15.4 % (ref 11.5–15.5)
WBC: 7 10*3/uL (ref 4.0–10.5)

## 2014-03-23 LAB — GLUCOSE, CAPILLARY
GLUCOSE-CAPILLARY: 216 mg/dL — AB (ref 70–99)
GLUCOSE-CAPILLARY: 236 mg/dL — AB (ref 70–99)
Glucose-Capillary: 345 mg/dL — ABNORMAL HIGH (ref 70–99)

## 2014-03-23 LAB — HM DIABETES EYE EXAM

## 2014-03-23 MED ORDER — GLUCOSE BLOOD VI STRP: 1.0000 | ORAL_STRIP | Freq: Three times a day (TID) | Status: DC

## 2014-03-23 MED ORDER — ACCU-CHEK FASTCLIX LANCETS MISC: 1.0000 | Freq: Three times a day (TID) | Status: DC

## 2014-03-23 MED ORDER — LISINOPRIL 20 MG PO TABS: 20.0000 mg | ORAL_TABLET | Freq: Every day | ORAL | Status: DC

## 2014-03-23 MED ORDER — INSULIN GLARGINE 100 UNIT/ML ~~LOC~~ SOLN: 36.0000 [IU] | Freq: Every day | SUBCUTANEOUS | Status: DC

## 2014-03-23 NOTE — Assessment & Plan Note (Signed)
BP Readings from Last 3 Encounters:  03/23/14 159/91  02/09/14 160/93  01/25/14 145/74    Lab Results  Component Value Date   NA 140 02/05/2014   K 4.7 02/05/2014   CREATININE 1.80* 02/05/2014    Assessment: Blood pressure control:  Elevated Progress toward BP goal:   Deteriorated Comments: patient currently on lisinopril 10 mg daily since discharge from hospital in November 2015. Patient needs better blood pressure control in order to undergo treatment in hyperbaric chamber for b/l BKA. He reports BP up to 174B systolic at wound clinic.  Plan: Medications:  Lisinopril 20 mg daily Educational resources provided: brochure Self management tools provided:   Other plans: Check BMET today. Recheck BP and BMET in 1-2 weeks. If BP not well controlled at wound care clinic, patient should call clinic for lisinopril to be increased to 40 mg daily.

## 2014-03-23 NOTE — Assessment & Plan Note (Signed)
Recheck BMET 

## 2014-03-23 NOTE — Assessment & Plan Note (Signed)
Likely related to blood loss from BKAs after trending hemoglobin versus surgeries. Patient has been compliant with ferrous sulfate and denies blood in stool.  -Check CBC -Check FOBT -Continue Ferrous Sulfate

## 2014-03-23 NOTE — Assessment & Plan Note (Addendum)
Lab Results  Component Value Date   HGBA1C 9.6 01/21/2014   HGBA1C 9.0 10/16/2013   HGBA1C 11.5* 07/22/2013     Assessment: Diabetes control:  Uncontrolled Progress toward A1C goal:   Deteriorated Comments: Patient is on Lantus 32 units QHS and Novolog 3-5 units TID with meals. Not checking glucose at home, but elevated in clinic 236 and elevated at wound care clinic in the 200s.   Plan: Medications:  Increase Lantus to 36 units qHS and continue novolog 3-5 units TID with meals Home glucose monitoring: Currently not checking since out of strips and glucometer not working Instruction/counseling given: reminded to get eye exam and reminded to bring blood glucose meter & log to each visit Educational resources provided: brochure (has information) Self management tools provided:   Other plans: Butch Penny replaced glucometer and I refilled strips and lancets. Retinal scan today.

## 2014-03-23 NOTE — Progress Notes (Signed)
Patient ID: [name], male   DOB: 01-31-1966, 48 y.o.   MRN: [id number] [facility] Attending  I saw and evaluated the patient.  I personally confirmed the key portions of the history and exam documented by Dr. Marvel Plan and I reviewed pertinent patient test results.  The assessment, diagnosis, and plan were formulated together and I agree with the documentation in the resident's note.

## 2014-03-23 NOTE — Assessment & Plan Note (Addendum)
Improving, bilateral bkas intact without evidence of infection or drainage. Patient requesting wheelchair gel cushion for seat.  -DME order for gel seat cushion

## 2014-03-23 NOTE — Progress Notes (Signed)
Subjective:    Patient ID: YANCARLOS BERTHOLD, male    DOB: Apr 06, 1966, 48 y.o.   MRN: [id number]  HPI Mr. Bellemare is a 48 yo male with PMHx of HTN, GERD, T2DM, CKD II, and s/p L BKA who presents for follow up. Please see problem oriented assessment and plan for more information.  Review of Systems General: Denies fever, chills, fatigue, change in appetite and diaphoresis.  Respiratory: Denies SOB, cough   Cardiovascular: Denies chest pain and palpitations.  Gastrointestinal: Denies nausea, vomiting, abdominal pain, diarrhea Genitourinary: Denies dysuria, urgency, frequency Musculoskeletal: Denies myalgias, back pain, joint swelling, arthralgias   Skin: Denies pallor, rash and wounds.  Neurological: Denies dizziness, headaches, weakness, lightheadedness  Past Medical History  Diagnosis Date  . Onychomycosis   . Hypertension   . Diabetic peripheral neuropathy   . Diabetic foot ulcer     s/p Right first ray amputation, left transmetatarsal amputation with revision  . History of drug abuse     Cocaine and marijuana  . Exposure to trichomonas     treated empirically  . Cellulitis of left foot 03/2008    left 4th and 5th metatarsal area  . Hyperlipidemia   . 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 Ulcerative esophagitis 0938    severe, complicated with UGI bleed  . Mallory [name] tear April 2009  . History of prolonged Q-T interval on ECG   . 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  . 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 CKD (chronic kidney disease) stage 2, GFR 60-89 ml/min     BL SCr 1.3-1.4  . Depression   . Peripheral vascular disease   . Type 2 diabetes mellitus, uncontrolled, with renal complications   . GERD (gastroesophageal reflux disease)   . 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 cephALEXin (KEFLEX) 500 MG capsule Take 1 capsule (500 mg total) by mouth every 8 (eight) hours. 60 capsule 0  . diphenhydramine-acetaminophen (TYLENOL PM) 25-500 MG TABS Take 1 tablet by mouth at bedtime as needed.    . docusate sodium 100 MG CAPS Take 100 mg by mouth daily as needed for mild constipation. 10 capsule 0  . ferrous sulfate 325 (65 FE) MG tablet Take 1 tablet (325 mg total) by mouth 3 (three) times daily with meals. 90 tablet 3  . FLUoxetine (PROZAC) 10 MG capsule Take 10 mg by mouth daily.    Marland Kitchen gabapentin (NEURONTIN) 600 MG tablet Take 600 mg by mouth 3 (three) times daily.    . insulin aspart (NOVOLOG) 100 UNIT/ML injection Inject 1-10 Units into the skin 3 (three) times daily before meals.     . insulin glargine (LANTUS) 100 UNIT/ML injection Inject 32 Units into the skin at bedtime.     Marland Kitchen lisinopril (PRINIVIL,ZESTRIL) 10 MG tablet Take 1 tablet by mouth daily 30 tablet 11  . nicotine (NICODERM CQ - DOSED IN MG/24 HOURS) 14 mg/24hr patch Place 1 patch onto the skin daily. 28 patch 0  .  oxyCODONE-acetaminophen (PERCOCET/ROXICET) 5-325 MG per tablet Take 1-2 tablets by mouth every 4 (four) hours as needed for moderate pain or severe pain. 30 tablet 0  . pantoprazole (PROTONIX) 40 MG tablet Take 40 mg by mouth daily as needed (for acid reflux).     No current facility-administered medications on file prior to visit.      Objective:   Physical Exam Filed Vitals:   03/23/14 1353  BP: 159/91  Pulse: 99  Temp: 98.1 F (36.7 C)  TempSrc: Oral  SpO2: 100%   General: Vital signs reviewed.  Patient is well-developed and well-nourished, in no acute distress and cooperative with exam.  Cardiovascular:  Tachycardic, regular rhythm, S1 normal, S2 normal, no murmurs, gallops, or rubs. Pulmonary/Chest: Clear to auscultation bilaterally, no wheezes, rales, or rhonchi. Abdominal: Soft, non-tender, non-distended, BS + Extremities: S/p bilateral BKA. Mild edema of left stump, no increased warmth, fluctuance, exudate, or open wounds.  Skin: Warm, dry and intact. No rashes or erythema. Psychiatric: Normal mood and affect. speech and behavior is normal. Cognition and memory are normal.     Assessment & Plan:   Please see problem based assessment and plan.

## 2014-03-23 NOTE — Patient Instructions (Signed)
General Instructions:   Please bring your medicines with you each time you come to clinic.  Medicines may include prescription medications, over-the-counter medications, herbal remedies, eye drops, vitamins, or other pills.  1. Please take lisinopril 20 mg daily, let us know if your blood pressure continues to be elevated 2. Take Lantus 36 units at night and novolog 3-5 units with meals 3. I refilled your strips and lancet and you now have a new meter 4. We will check labs on you today for your blood count and kidney function 5. Please use FOBT cards to check for blood in your stool  Hypertension Hypertension, commonly called high blood pressure, is when the force of blood pumping through your arteries is too strong. Your arteries are the blood vessels that carry blood from your heart throughout your body. A blood pressure reading consists of a higher number over a lower number, such as 110/72. The higher number (systolic) is the pressure inside your arteries when your heart pumps. The lower number (diastolic) is the pressure inside your arteries when your heart relaxes. Ideally you want your blood pressure below 120/80. Hypertension forces your heart to work harder to pump blood. Your arteries may become narrow or stiff. Having hypertension puts you at risk for heart disease, stroke, and other problems.  RISK FACTORS Some risk factors for high blood pressure are controllable. Others are not.  Risk factors you cannot control include:   Race. You may be at higher risk if you are African American.  Age. Risk increases with age.  Gender. Men are at higher risk than women before age 15 years. After age 43, women are at higher risk than men. Risk factors you can control include:  Not getting enough exercise or physical activity.  Being overweight.  Getting too much fat, sugar, calories, or salt in your diet.  Drinking too much alcohol. SIGNS AND SYMPTOMS Hypertension does not usually  cause signs or symptoms. Extremely high blood pressure (hypertensive crisis) may cause headache, anxiety, shortness of breath, and nosebleed. DIAGNOSIS  To check if you have hypertension, your health care provider will measure your blood pressure while you are seated, with your arm held at the level of your heart. It should be measured at least twice using the same arm. Certain conditions can cause a difference in blood pressure between your right and left arms. A blood pressure reading that is higher than normal on one occasion does not mean that you need treatment. If one blood pressure reading is high, ask your health care provider about having it checked again. TREATMENT  Treating high blood pressure includes making lifestyle changes and possibly taking medicine. Living a healthy lifestyle can help lower high blood pressure. You may need to change some of your habits. Lifestyle changes may include:  Following the DASH diet. This diet is high in fruits, vegetables, and whole grains. It is low in salt, red meat, and added sugars.  Getting at least 2 hours of brisk physical activity every week.  Losing weight if necessary.  Not smoking.  Limiting alcoholic beverages.  Learning ways to reduce stress. If lifestyle changes are not enough to get your blood pressure under control, your health care provider may prescribe medicine. You may need to take more than one. Work closely with your health care provider to understand the risks and benefits. HOME CARE INSTRUCTIONS  Have your blood pressure rechecked as directed by your health care provider.   Take medicines only as directed by your  health care provider. Follow the directions carefully. Blood pressure medicines must be taken as prescribed. The medicine does not work as well when you skip doses. Skipping doses also puts you at risk for problems.   Do not smoke.   Monitor your blood pressure at home as directed by your health care  provider. SEEK MEDICAL CARE IF:   You think you are having a reaction to medicines taken.  You have recurrent headaches or feel dizzy.  You have swelling in your ankles.  You have trouble with your vision. SEEK IMMEDIATE MEDICAL CARE IF:  You develop a severe headache or confusion.  You have unusual weakness, numbness, or feel faint.  You have severe chest or abdominal pain.  You vomit repeatedly.  You have trouble breathing. MAKE SURE YOU:   Understand these instructions.  Will watch your condition.  Will get help right away if you are not doing well or get worse. Document Released: 03/27/2005 Document Revised: 08/11/2013 Document Reviewed: 01/17/2013 [facility] Patient Information 2015 [place], [place]. This information is not intended to replace advice given to you by your health care provider. Make sure you discuss any questions you have with your health care provider.

## 2014-03-24 DIAGNOSIS — L02214 Cutaneous abscess of groin: Secondary | ICD-10-CM | POA: Diagnosis not present

## 2014-03-24 DIAGNOSIS — L97822 Non-pressure chronic ulcer of other part of left lower leg with fat layer exposed: Secondary | ICD-10-CM | POA: Diagnosis not present

## 2014-03-24 DIAGNOSIS — E11622 Type 2 diabetes mellitus with other skin ulcer: Secondary | ICD-10-CM | POA: Diagnosis not present

## 2014-03-24 DIAGNOSIS — T8744 Infection of amputation stump, left lower extremity: Secondary | ICD-10-CM | POA: Diagnosis not present

## 2014-03-24 LAB — GLUCOSE, CAPILLARY
GLUCOSE-CAPILLARY: 107 mg/dL — AB (ref 70–99)
Glucose-Capillary: 136 mg/dL — ABNORMAL HIGH (ref 70–99)
Glucose-Capillary: 120 mg/dL — ABNORMAL HIGH (ref 70–99)

## 2014-03-24 LAB — BASIC METABOLIC PANEL WITH GFR
BUN: 14 mg/dL (ref 6–23)
CHLORIDE: 101 meq/L (ref 96–112)
CO2: 28 meq/L (ref 19–32)
Calcium: 9.4 mg/dL (ref 8.4–10.5)
Creat: 1.39 mg/dL — ABNORMAL HIGH (ref 0.50–1.35)
GFR, Est African American: 69 mL/min
GFR, Est Non African American: 60 mL/min
Glucose, Bld: 217 mg/dL — ABNORMAL HIGH (ref 70–99)
POTASSIUM: 4.4 meq/L (ref 3.5–5.3)
SODIUM: 139 meq/L (ref 135–145)

## 2014-03-25 DIAGNOSIS — L97822 Non-pressure chronic ulcer of other part of left lower leg with fat layer exposed: Secondary | ICD-10-CM | POA: Diagnosis not present

## 2014-03-25 DIAGNOSIS — E11622 Type 2 diabetes mellitus with other skin ulcer: Secondary | ICD-10-CM | POA: Diagnosis not present

## 2014-03-25 DIAGNOSIS — T8744 Infection of amputation stump, left lower extremity: Secondary | ICD-10-CM | POA: Diagnosis not present

## 2014-03-25 DIAGNOSIS — L02214 Cutaneous abscess of groin: Secondary | ICD-10-CM | POA: Diagnosis not present

## 2014-03-25 LAB — GLUCOSE, CAPILLARY
GLUCOSE-CAPILLARY: 190 mg/dL — AB (ref 70–99)
Glucose-Capillary: 184 mg/dL — ABNORMAL HIGH (ref 70–99)

## 2014-03-26 DIAGNOSIS — T8744 Infection of amputation stump, left lower extremity: Secondary | ICD-10-CM | POA: Diagnosis not present

## 2014-03-26 DIAGNOSIS — E11622 Type 2 diabetes mellitus with other skin ulcer: Secondary | ICD-10-CM | POA: Diagnosis not present

## 2014-03-26 DIAGNOSIS — L02214 Cutaneous abscess of groin: Secondary | ICD-10-CM | POA: Diagnosis not present

## 2014-03-26 DIAGNOSIS — L97822 Non-pressure chronic ulcer of other part of left lower leg with fat layer exposed: Secondary | ICD-10-CM | POA: Diagnosis not present

## 2014-03-26 LAB — GLUCOSE, CAPILLARY
Glucose-Capillary: 127 mg/dL — ABNORMAL HIGH (ref 70–99)
Glucose-Capillary: 119 mg/dL — ABNORMAL HIGH (ref 70–99)

## 2014-03-27 DIAGNOSIS — E11622 Type 2 diabetes mellitus with other skin ulcer: Secondary | ICD-10-CM | POA: Diagnosis not present

## 2014-03-27 DIAGNOSIS — T8744 Infection of amputation stump, left lower extremity: Secondary | ICD-10-CM | POA: Diagnosis not present

## 2014-03-27 DIAGNOSIS — L97822 Non-pressure chronic ulcer of other part of left lower leg with fat layer exposed: Secondary | ICD-10-CM | POA: Diagnosis not present

## 2014-03-27 DIAGNOSIS — L02214 Cutaneous abscess of groin: Secondary | ICD-10-CM | POA: Diagnosis not present

## 2014-03-27 LAB — GLUCOSE, CAPILLARY
GLUCOSE-CAPILLARY: 271 mg/dL — AB (ref 70–99)
GLUCOSE-CAPILLARY: 253 mg/dL — AB (ref 70–99)

## 2014-03-30 ENCOUNTER — Encounter: Payer: Self-pay | Admitting: *Deleted

## 2014-03-30 DIAGNOSIS — L97822 Non-pressure chronic ulcer of other part of left lower leg with fat layer exposed: Secondary | ICD-10-CM | POA: Diagnosis not present

## 2014-03-30 DIAGNOSIS — E11622 Type 2 diabetes mellitus with other skin ulcer: Secondary | ICD-10-CM | POA: Diagnosis not present

## 2014-03-30 DIAGNOSIS — T8744 Infection of amputation stump, left lower extremity: Secondary | ICD-10-CM | POA: Diagnosis not present

## 2014-03-30 DIAGNOSIS — L02214 Cutaneous abscess of groin: Secondary | ICD-10-CM | POA: Diagnosis not present

## 2014-03-30 LAB — GLUCOSE, CAPILLARY
Glucose-Capillary: 145 mg/dL — ABNORMAL HIGH (ref 70–99)
Glucose-Capillary: 164 mg/dL — ABNORMAL HIGH (ref 70–99)

## 2014-03-31 DIAGNOSIS — L97822 Non-pressure chronic ulcer of other part of left lower leg with fat layer exposed: Secondary | ICD-10-CM | POA: Diagnosis not present

## 2014-03-31 DIAGNOSIS — L02214 Cutaneous abscess of groin: Secondary | ICD-10-CM | POA: Diagnosis not present

## 2014-03-31 DIAGNOSIS — T8744 Infection of amputation stump, left lower extremity: Secondary | ICD-10-CM | POA: Diagnosis not present

## 2014-03-31 DIAGNOSIS — E11622 Type 2 diabetes mellitus with other skin ulcer: Secondary | ICD-10-CM | POA: Diagnosis not present

## 2014-03-31 LAB — GLUCOSE, CAPILLARY
GLUCOSE-CAPILLARY: 322 mg/dL — AB (ref 70–99)
Glucose-Capillary: 211 mg/dL — ABNORMAL HIGH (ref 70–99)

## 2014-04-01 DIAGNOSIS — E11622 Type 2 diabetes mellitus with other skin ulcer: Secondary | ICD-10-CM | POA: Diagnosis not present

## 2014-04-01 DIAGNOSIS — T8744 Infection of amputation stump, left lower extremity: Secondary | ICD-10-CM | POA: Diagnosis not present

## 2014-04-01 DIAGNOSIS — L97822 Non-pressure chronic ulcer of other part of left lower leg with fat layer exposed: Secondary | ICD-10-CM | POA: Diagnosis not present

## 2014-04-01 DIAGNOSIS — L02214 Cutaneous abscess of groin: Secondary | ICD-10-CM | POA: Diagnosis not present

## 2014-04-01 LAB — GLUCOSE, CAPILLARY
Glucose-Capillary: 151 mg/dL — ABNORMAL HIGH (ref 70–99)
Glucose-Capillary: 67 mg/dL — ABNORMAL LOW (ref 70–99)

## 2014-04-06 ENCOUNTER — Ambulatory Visit: Payer: Medicaid Other | Admitting: Internal Medicine

## 2014-04-06 DIAGNOSIS — L97822 Non-pressure chronic ulcer of other part of left lower leg with fat layer exposed: Secondary | ICD-10-CM | POA: Diagnosis not present

## 2014-04-06 DIAGNOSIS — L02214 Cutaneous abscess of groin: Secondary | ICD-10-CM | POA: Diagnosis not present

## 2014-04-06 DIAGNOSIS — T8744 Infection of amputation stump, left lower extremity: Secondary | ICD-10-CM | POA: Diagnosis not present

## 2014-04-06 DIAGNOSIS — E11622 Type 2 diabetes mellitus with other skin ulcer: Secondary | ICD-10-CM | POA: Diagnosis not present

## 2014-04-06 LAB — GLUCOSE, CAPILLARY: Glucose-Capillary: 330 mg/dL — ABNORMAL HIGH (ref 70–99)

## 2014-04-07 DIAGNOSIS — L02214 Cutaneous abscess of groin: Secondary | ICD-10-CM | POA: Diagnosis not present

## 2014-04-07 LAB — GLUCOSE, CAPILLARY: GLUCOSE-CAPILLARY: 451 mg/dL — AB (ref 70–99)

## 2014-04-08 DIAGNOSIS — E11622 Type 2 diabetes mellitus with other skin ulcer: Secondary | ICD-10-CM | POA: Diagnosis not present

## 2014-04-08 DIAGNOSIS — T8744 Infection of amputation stump, left lower extremity: Secondary | ICD-10-CM | POA: Diagnosis not present

## 2014-04-08 DIAGNOSIS — L97822 Non-pressure chronic ulcer of other part of left lower leg with fat layer exposed: Secondary | ICD-10-CM | POA: Diagnosis not present

## 2014-04-08 DIAGNOSIS — L02214 Cutaneous abscess of groin: Secondary | ICD-10-CM | POA: Diagnosis not present

## 2014-04-08 LAB — GLUCOSE, CAPILLARY
Glucose-Capillary: 282 mg/dL — ABNORMAL HIGH (ref 70–99)
Glucose-Capillary: 80 mg/dL (ref 70–99)

## 2014-04-09 DIAGNOSIS — L02214 Cutaneous abscess of groin: Secondary | ICD-10-CM | POA: Diagnosis not present

## 2014-04-09 DIAGNOSIS — L97822 Non-pressure chronic ulcer of other part of left lower leg with fat layer exposed: Secondary | ICD-10-CM | POA: Diagnosis not present

## 2014-04-09 DIAGNOSIS — T8744 Infection of amputation stump, left lower extremity: Secondary | ICD-10-CM | POA: Diagnosis not present

## 2014-04-09 DIAGNOSIS — E11622 Type 2 diabetes mellitus with other skin ulcer: Secondary | ICD-10-CM | POA: Diagnosis not present

## 2014-04-09 LAB — GLUCOSE, CAPILLARY
Glucose-Capillary: 62 mg/dL — ABNORMAL LOW (ref 70–99)
Glucose-Capillary: 74 mg/dL (ref 70–99)
Glucose-Capillary: 94 mg/dL (ref 70–99)

## 2014-04-13 ENCOUNTER — Encounter (HOSPITAL_BASED_OUTPATIENT_CLINIC_OR_DEPARTMENT_OTHER): Payer: Medicaid Other | Attending: Internal Medicine

## 2014-04-13 DIAGNOSIS — Z89512 Acquired absence of left leg below knee: Secondary | ICD-10-CM | POA: Diagnosis not present

## 2014-04-13 DIAGNOSIS — L98499 Non-pressure chronic ulcer of skin of other sites with unspecified severity: Secondary | ICD-10-CM | POA: Diagnosis not present

## 2014-04-13 DIAGNOSIS — L97822 Non-pressure chronic ulcer of other part of left lower leg with fat layer exposed: Secondary | ICD-10-CM | POA: Diagnosis not present

## 2014-04-13 DIAGNOSIS — E11622 Type 2 diabetes mellitus with other skin ulcer: Secondary | ICD-10-CM | POA: Insufficient documentation

## 2014-04-14 DIAGNOSIS — L98499 Non-pressure chronic ulcer of skin of other sites with unspecified severity: Secondary | ICD-10-CM | POA: Diagnosis not present

## 2014-04-14 DIAGNOSIS — L97822 Non-pressure chronic ulcer of other part of left lower leg with fat layer exposed: Secondary | ICD-10-CM | POA: Diagnosis not present

## 2014-04-14 DIAGNOSIS — E11622 Type 2 diabetes mellitus with other skin ulcer: Secondary | ICD-10-CM | POA: Diagnosis not present

## 2014-04-14 DIAGNOSIS — Z89512 Acquired absence of left leg below knee: Secondary | ICD-10-CM | POA: Diagnosis not present

## 2014-04-14 LAB — GLUCOSE, CAPILLARY
GLUCOSE-CAPILLARY: 248 mg/dL — AB (ref 70–99)
Glucose-Capillary: 104 mg/dL — ABNORMAL HIGH (ref 70–99)

## 2014-04-15 LAB — GLUCOSE, CAPILLARY
Glucose-Capillary: 215 mg/dL — ABNORMAL HIGH (ref 70–99)
Glucose-Capillary: 160 mg/dL — ABNORMAL HIGH (ref 70–99)

## 2014-04-16 DIAGNOSIS — E11622 Type 2 diabetes mellitus with other skin ulcer: Secondary | ICD-10-CM | POA: Diagnosis not present

## 2014-04-16 DIAGNOSIS — L97822 Non-pressure chronic ulcer of other part of left lower leg with fat layer exposed: Secondary | ICD-10-CM | POA: Diagnosis not present

## 2014-04-16 DIAGNOSIS — Z89512 Acquired absence of left leg below knee: Secondary | ICD-10-CM | POA: Diagnosis not present

## 2014-04-16 DIAGNOSIS — L98499 Non-pressure chronic ulcer of skin of other sites with unspecified severity: Secondary | ICD-10-CM | POA: Diagnosis not present

## 2014-04-22 ENCOUNTER — Telehealth: Payer: Self-pay | Admitting: Licensed Clinical Social Worker

## 2014-04-22 NOTE — Telephone Encounter (Signed)
Mr. Corrales called to request assistance with transportation to [facility] on Jan. 18th at 8:15.  Transportation scheduled.  Pt notified transportation:  Transportation states: Tell him to be ready 6:55 to 7:35. Our office is closed that day so tell him to call [phone] if he has any questions about the trips.

## 2014-05-12 ENCOUNTER — Telehealth: Payer: Self-pay | Admitting: Internal Medicine

## 2014-05-12 NOTE — Telephone Encounter (Signed)
Call to patient to confirm appointment for 05/13/14 at 3:45. LMTCB

## 2014-05-13 ENCOUNTER — Encounter: Payer: Medicaid Other | Admitting: Internal Medicine

## 2014-05-26 ENCOUNTER — Encounter: Payer: Self-pay | Admitting: Internal Medicine

## 2014-05-26 ENCOUNTER — Ambulatory Visit (INDEPENDENT_AMBULATORY_CARE_PROVIDER_SITE_OTHER): Payer: Medicaid Other | Admitting: Internal Medicine

## 2014-05-26 VITALS — BP 169/105 | HR 93 | Temp 97.9°F | Ht 68.0 in

## 2014-05-26 DIAGNOSIS — Z89511 Acquired absence of right leg below knee: Secondary | ICD-10-CM

## 2014-05-26 DIAGNOSIS — I10 Essential (primary) hypertension: Secondary | ICD-10-CM

## 2014-05-26 DIAGNOSIS — F329 Major depressive disorder, single episode, unspecified: Secondary | ICD-10-CM

## 2014-05-26 DIAGNOSIS — F32A Depression, unspecified: Secondary | ICD-10-CM

## 2014-05-26 DIAGNOSIS — F17210 Nicotine dependence, cigarettes, uncomplicated: Secondary | ICD-10-CM

## 2014-05-26 DIAGNOSIS — K5900 Constipation, unspecified: Secondary | ICD-10-CM

## 2014-05-26 DIAGNOSIS — N529 Male erectile dysfunction, unspecified: Secondary | ICD-10-CM | POA: Insufficient documentation

## 2014-05-26 DIAGNOSIS — E118 Type 2 diabetes mellitus with unspecified complications: Secondary | ICD-10-CM

## 2014-05-26 DIAGNOSIS — N182 Chronic kidney disease, stage 2 (mild): Secondary | ICD-10-CM

## 2014-05-26 DIAGNOSIS — I129 Hypertensive chronic kidney disease with stage 1 through stage 4 chronic kidney disease, or unspecified chronic kidney disease: Secondary | ICD-10-CM

## 2014-05-26 DIAGNOSIS — F17200 Nicotine dependence, unspecified, uncomplicated: Secondary | ICD-10-CM

## 2014-05-26 DIAGNOSIS — D509 Iron deficiency anemia, unspecified: Secondary | ICD-10-CM

## 2014-05-26 DIAGNOSIS — Z89432 Acquired absence of left foot: Secondary | ICD-10-CM

## 2014-05-26 DIAGNOSIS — Z794 Long term (current) use of insulin: Secondary | ICD-10-CM

## 2014-05-26 DIAGNOSIS — E1122 Type 2 diabetes mellitus with diabetic chronic kidney disease: Secondary | ICD-10-CM

## 2014-05-26 DIAGNOSIS — IMO0002 Reserved for concepts with insufficient information to code with codable children: Secondary | ICD-10-CM

## 2014-05-26 DIAGNOSIS — E1165 Type 2 diabetes mellitus with hyperglycemia: Secondary | ICD-10-CM

## 2014-05-26 LAB — BASIC METABOLIC PANEL WITH GFR
BUN: 12 mg/dL (ref 6–23)
CO2: 25 mEq/L (ref 19–32)
CREATININE: 1.43 mg/dL — AB (ref 0.50–1.35)
Calcium: 9.3 mg/dL (ref 8.4–10.5)
Chloride: 102 mEq/L (ref 96–112)
GFR, EST AFRICAN AMERICAN: 66 mL/min
GFR, Est Non African American: 57 mL/min — ABNORMAL LOW
GLUCOSE: 211 mg/dL — AB (ref 70–99)
Potassium: 4.6 mEq/L (ref 3.5–5.3)
SODIUM: 136 meq/L (ref 135–145)

## 2014-05-26 LAB — POCT GLYCOSYLATED HEMOGLOBIN (HGB A1C): Hemoglobin A1C: 9.6

## 2014-05-26 LAB — GLUCOSE, CAPILLARY: Glucose-Capillary: 187 mg/dL — ABNORMAL HIGH (ref 70–99)

## 2014-05-26 MED ORDER — LISINOPRIL 40 MG PO TABS: 20.0000 mg | ORAL_TABLET | Freq: Every day | ORAL | Status: DC

## 2014-05-26 MED ORDER — SILDENAFIL CITRATE 50 MG PO TABS: 50.0000 mg | ORAL_TABLET | ORAL | Status: DC | PRN

## 2014-05-26 MED ORDER — ACETAMINOPHEN-CODEINE #2 300-15 MG PO TABS: 1.0000 | ORAL_TABLET | Freq: Three times a day (TID) | ORAL | Status: DC | PRN

## 2014-05-26 MED ORDER — INSULIN GLARGINE 100 UNIT/ML ~~LOC~~ SOLN: 40.0000 [IU] | Freq: Every day | SUBCUTANEOUS | Status: DC

## 2014-05-26 MED ORDER — POLYETHYLENE GLYCOL 3350 17 G PO PACK: 17.0000 g | PACK | Freq: Every day | ORAL | Status: DC

## 2014-05-26 MED ORDER — SENNOSIDES-DOCUSATE SODIUM 8.6-50 MG PO TABS: 1.0000 | ORAL_TABLET | Freq: Every day | ORAL | Status: DC

## 2014-05-26 NOTE — Assessment & Plan Note (Signed)
Will increase Lisinopril to 40 mg daily. Check BMP today.

## 2014-05-26 NOTE — Assessment & Plan Note (Signed)
Currently denies any symptoms of anemia like fatigue, dizziness, or shortness of breath. He does not have rectal bleeding or melena. Will continue with iron supplementation, and plan to recheck CBC and iron levels in 2 months.

## 2014-05-26 NOTE — Assessment & Plan Note (Signed)
Symptoms are stable with Prozac 20 mg daily. Will continue.

## 2014-05-26 NOTE — Patient Instructions (Addendum)
General Instructions: Please increased Lantus to 40 units. You may increase Lantus by 4 units if fasting blood sugar remain above 140 after 1 week. Please take Tylenol #3 1-2 pills every 8 hours as needed Please increase lisinopril 40 mg daily  Go to lab Please come back in 2-3 weeks   Please bring your medicines with you each time you come to clinic.  Medicines may include prescription medications, over-the-counter medications, herbal remedies, eye drops, vitamins, or other pills.   Progress Toward Treatment Goals:  Treatment Goal 05/26/2014  Hemoglobin A1C unchanged  Blood pressure unchanged  Stop smoking -    Self Care Goals & Plans:  Self Care Goal 05/26/2014  Manage my medications take my medicines as prescribed; bring my medications to every visit; refill my medications on time  Monitor my health -  Eat healthy foods drink diet soda or water instead of juice or soda; eat more vegetables; eat foods that are low in salt; eat fruit for snacks and desserts; eat baked foods instead of fried foods  Be physically active -    Home Blood Glucose Monitoring 05/26/2014  Check my blood sugar 3 times a day  When to check my blood sugar before breakfast; before lunch; before dinner     Care Management & Community Referrals:  Referral 05/26/2014  Referrals made for care management support none needed  Referrals made to community resources -

## 2014-05-26 NOTE — Assessment & Plan Note (Signed)
Lab Results  Component Value Date   HGBA1C 9.6 05/26/2014   HGBA1C 9.6 01/21/2014   HGBA1C 9.0 10/16/2013     Assessment: Diabetes control: poor control (HgbA1C >9%) Progress toward A1C goal:  unchanged Comments: He states that he monitors his CBGs at home and they usually range between 180s to 200s. No hypoglycemic episodes and no hyperglycemic symptoms. He did not bring his glucose meter. Marland Kitchen He is compliant with Lantus 36 units daily and NovoLog 3-5 units 3 times a day with meals.   CBG here is 187.  Plan: Medications:  Increase Lantus to 40 units daily at bedtime. Continue with NovoLog 3-5 units 3 times a day before meals. Home glucose monitoring: Frequency: 3 times a day Timing: before breakfast, before lunch, before dinner Instruction/counseling given: reminded to get eye exam, reminded to bring blood glucose meter & log to each visit, reminded to bring medications to each visit and discussed diet Educational resources provided: brochure (denies) Self management tools provided:   Other plans:  I encouraged him that if his fasting blood sugars remain higher than 140 to increase Lantus dose by 4 units. He can continue with this stepwise weekly increments until his blood sugars between 100-140. Will follow-up in about one month

## 2014-05-26 NOTE — Assessment & Plan Note (Signed)
BP Readings from Last 3 Encounters:  05/26/14 169/105  03/23/14 159/91  02/09/14 160/93    Lab Results  Component Value Date   NA 139 03/23/2014   K 4.4 03/23/2014   CREATININE 1.39* 03/23/2014    Assessment: Blood pressure control: moderately elevated Progress toward BP goal:  unchanged Comments: On Lisinopril 20 mg daily.  Plan: Medications:  I will increase Lisinopril to 40 mg daily. Educational resources provided: brochure (denies) Self management tools provided:   Other plans: BMP today. Follow-up in 2-3 weeks.

## 2014-05-26 NOTE — Assessment & Plan Note (Signed)
Awaiting prosthetic lower extremity placement. He is complaining of pain in the amputation stumps which have been severe enough to interrupt his sleep. He takes Tylenol PM, which has not helped his pain. Discussed pain management with the patient.  Plan -Trial of Tylenol #3 1-2 pills every 8 hours when necessary -Reevaluate in about a month. -We will try to avoid opioids in this setting.

## 2014-05-26 NOTE — Progress Notes (Signed)
Patient ID: [name], male   DOB: 12/01/1965, 49 y.o.   MRN: [id number]   Subjective:   HPI: [name] is a 49 y.o. gentleman with past medical history of uncontrolled diabetes, essential hypertension, depression, bilateral BKA, among other health problems, presents for clinic follow-up visit.  During his last visit on 03/23/2014, his blood pressure was found to be elevated and his lisinopril was increased from 10 mg 20 mg daily. He states that has been compliant with his lisinopril, but his blood pressure today is 169/105.  Patient is also complaining of ongoing pain in his stumps bilaterally. His been evaluated for prosthetic legs.  Kindly see the A&P for the status of the pt's chronic medical problems.   ROS: Constitutional: Denies fever, chills, diaphoresis, appetite change and fatigue.  Respiratory: Denies SOB, DOE, cough, chest tightness, and wheezing. Denies chest pain. CVS: No chest pain, palpitations and leg swelling.  GI: he reports ongoing constipation which is not being relieved by docusate. No abdominal pain, nausea, vomiting, bloody stools GU: No dysuria, frequency, hematuria, or flank pain.  MSK: Pain in both his lower extremities times. Tylenol PM has not been very helpful. No myalgias, back pain, joint swelling, arthralgias  Psych:depression symptoms have been well controlled with Prozac . No SI or SA.     Objective:  Physical Exam: Filed Vitals:   05/26/14 0941  BP: 169/105  Pulse: 93  Temp: 97.9 F (36.6 C)  TempSrc: Oral  Height: 5\' 8"  (1.727 m)  SpO2: 100%   General: Well nourished. No acute distress.  HEENT: Normal oral mucosa. MMM.  Lungs: CTA bilaterally. Heart: RRR; no extra sounds or murmurs  Abdomen: Non-distended, normal bowel sounds, soft, nontender; no hepatosplenomegaly  Extremities: bilateral BKA. No signs of infection, inflammation around the stump areas. No joint swelling or tenderness. Neurologic: Normal EOM,  Alert and oriented  x3. No obvious neurologic/cranial nerve deficits.  Assessment & Plan:  Discussed case with my attending in the clinic, Dr. Marinda Elk. See problem based charting.

## 2014-05-26 NOTE — Assessment & Plan Note (Signed)
He continues to smoke. Not ready to quit. Will continue to follow-up.

## 2014-05-26 NOTE — Assessment & Plan Note (Signed)
Prescribed Viagra. Patient has no personal history of cardiac problems.

## 2014-05-26 NOTE — Assessment & Plan Note (Signed)
His constipation is most likely related to iron supplementation. I will increase his bowel regimen including MiraLAX and Senokot.

## 2014-05-27 NOTE — Addendum Note (Signed)
Addended by: [name] on: 05/27/2014 03:41 PM   Modules accepted: Level of Service

## 2014-05-28 NOTE — Progress Notes (Signed)
Internal Medicine Clinic Attending Date of visit: 05/26/2014  Case discussed with Dr. Alice Rieger soon after the resident saw the patient on the day of the visit.  We reviewed the resident's history and exam and pertinent patient test results.  I agree with the assessment, diagnosis, and plan of care documented in the resident's note.

## 2014-06-04 ENCOUNTER — Other Ambulatory Visit: Payer: Self-pay | Admitting: *Deleted

## 2014-06-04 MED ORDER — GABAPENTIN 600 MG PO TABS: 600.0000 mg | ORAL_TABLET | Freq: Three times a day (TID) | ORAL | Status: DC

## 2014-06-15 ENCOUNTER — Telehealth: Payer: Self-pay | Admitting: Internal Medicine

## 2014-06-15 NOTE — Telephone Encounter (Signed)
Call to patient to confirm appointment for 06/16/14 at 9:15 lmtcb ° °

## 2014-06-16 ENCOUNTER — Ambulatory Visit (INDEPENDENT_AMBULATORY_CARE_PROVIDER_SITE_OTHER): Payer: Medicaid Other | Admitting: Internal Medicine

## 2014-06-16 ENCOUNTER — Encounter: Payer: Self-pay | Admitting: Internal Medicine

## 2014-06-16 VITALS — BP 189/101 | HR 104 | Temp 98.3°F | Ht 68.0 in | Wt 230.6 lb

## 2014-06-16 DIAGNOSIS — I10 Essential (primary) hypertension: Secondary | ICD-10-CM

## 2014-06-16 DIAGNOSIS — K5909 Other constipation: Secondary | ICD-10-CM

## 2014-06-16 DIAGNOSIS — N182 Chronic kidney disease, stage 2 (mild): Secondary | ICD-10-CM

## 2014-06-16 DIAGNOSIS — I129 Hypertensive chronic kidney disease with stage 1 through stage 4 chronic kidney disease, or unspecified chronic kidney disease: Secondary | ICD-10-CM

## 2014-06-16 DIAGNOSIS — E118 Type 2 diabetes mellitus with unspecified complications: Secondary | ICD-10-CM

## 2014-06-16 DIAGNOSIS — E1165 Type 2 diabetes mellitus with hyperglycemia: Secondary | ICD-10-CM

## 2014-06-16 DIAGNOSIS — N529 Male erectile dysfunction, unspecified: Secondary | ICD-10-CM

## 2014-06-16 DIAGNOSIS — IMO0002 Reserved for concepts with insufficient information to code with codable children: Secondary | ICD-10-CM

## 2014-06-16 DIAGNOSIS — G546 Phantom limb syndrome with pain: Secondary | ICD-10-CM

## 2014-06-16 LAB — BASIC METABOLIC PANEL WITH GFR
BUN: 21 mg/dL (ref 6–23)
CALCIUM: 9.7 mg/dL (ref 8.4–10.5)
CO2: 22 meq/L (ref 19–32)
Chloride: 99 mEq/L (ref 96–112)
Creat: 1.45 mg/dL — ABNORMAL HIGH (ref 0.50–1.35)
GFR, Est African American: 65 mL/min
GFR, Est Non African American: 57 mL/min — ABNORMAL LOW
Glucose, Bld: 315 mg/dL — ABNORMAL HIGH (ref 70–99)
POTASSIUM: 4.5 meq/L (ref 3.5–5.3)
SODIUM: 138 meq/L (ref 135–145)

## 2014-06-16 MED ORDER — AMLODIPINE BESYLATE 5 MG PO TABS: 5.0000 mg | ORAL_TABLET | Freq: Every day | ORAL | Status: DC

## 2014-06-16 MED ORDER — ACETAMINOPHEN 500 MG PO TABS
500.0000 mg | ORAL_TABLET | Freq: Four times a day (QID) | ORAL | Status: DC | PRN
Start: 2014-06-16 — End: 2014-07-27

## 2014-06-16 MED ORDER — SILDENAFIL CITRATE 20 MG PO TABS: ORAL_TABLET | ORAL | Status: DC

## 2014-06-16 MED ORDER — INSULIN GLARGINE 100 UNIT/ML ~~LOC~~ SOLN: 44.0000 [IU] | Freq: Every day | SUBCUTANEOUS | Status: DC

## 2014-06-16 NOTE — Assessment & Plan Note (Addendum)
BL cr of 1.4. Cr of 1.4 on 2/16. Lisinopril dose doubled during his last visit, will check BMET today.  -Addendum: BMET returned after visit with BL cr of 1.4

## 2014-06-16 NOTE — Assessment & Plan Note (Addendum)
Lab Results  Component Value Date   HGBA1C 9.6 05/26/2014   HGBA1C 9.6 01/21/2014   HGBA1C 9.0 10/16/2013     Assessment: Diabetes control:  not conotrlled Progress toward A1C goal:   not at goal Comments: He is on Novolog 3-5 units TID ac and Lantus 40 units qHS. Fasting blood sugars have been in the rage of 200-400 . Denies hypoglycemia symptoms.  Plan: Medications:  Increase Lantus to 44 units qHS Home glucose monitoring: Frequency:   Timing:   Instruction/counseling given: reminded to bring blood glucose meter & log to each visit Educational resources provided:   Self management tools provided:   Other plans: Follow up in 1 month.

## 2014-06-16 NOTE — Progress Notes (Signed)
   Subjective:    Patient ID: [name], male    DOB: [birth year], 49 y.o.   MRN: [id number]  HPI [name] is a 49 yr old man with PMH of uncontrolled DM2, HTN, depression, bilateral BKA, presenting for follow up visit for his diabetes and hypertension.  He was last seen at [facility] on 2/16 with instruction to increase his lisinopril to 40mg  daily (from 20mg  daily) and to increase his bedtime Lantus dose from 40 units by 4 units each week if his fasting blood sugars remain higher then 140 until he reached a target fasting blood sugar of 100-140. He complained of bilateral LE pain during that visit and was given Tylenol #3 for this pain. Finally he also complained of constipation (thought to be due to iron supplementation) and was started on Miralax and senokot.  During this visit he tells me that he has not increased the Lantus dose to 44 unit due to concern of hypoglycemia and has had fasting blood sugars persistently in the high 200s-400s.  He has not taken senokot because it is not covered by Medicaid but has taken Miralax with good results.  He continues to have bilateral phantom limb pain, Tylenol #3 barely touched. He again states that he is not interested in having long term narcotic treatment for his pain.     Review of Systems  Constitutional: Negative for fever, chills, diaphoresis, activity change, appetite change and fatigue.  Respiratory: Negative for cough and shortness of breath.   Cardiovascular: Negative for chest pain and leg swelling.  Gastrointestinal: Positive for constipation. Negative for nausea, vomiting and abdominal pain.  Genitourinary: Negative for dysuria and difficulty urinating.  Musculoskeletal: Positive for arthralgias. Negative for myalgias.  Neurological: Negative for dizziness and light-headedness.  Psychiatric/Behavioral: Negative for agitation.       Objective:   Physical Exam  Constitutional: He is oriented to person, place, and time. He appears  well-developed and well-nourished. No distress.  Sitting in wheelchair   Cardiovascular: Normal rate.   Pulmonary/Chest: Effort normal. No respiratory distress.  Abdominal: Soft. Bowel sounds are normal. There is no tenderness.  Musculoskeletal:  Bilateral BKA with prosthetic limbs in place   Neurological: He is alert and oriented to person, place, and time.  Skin: Skin is warm and dry. He is not diaphoretic.  Psychiatric: He has a normal mood and affect.  Nursing note and vitals reviewed.         Assessment & Plan:

## 2014-06-16 NOTE — Assessment & Plan Note (Addendum)
Likely due to iron supplementation. Improved with Miralax, will cont this medication for now.

## 2014-06-16 NOTE — Assessment & Plan Note (Signed)
BP Readings from Last 3 Encounters:  06/16/14 189/101  05/26/14 169/105  03/23/14 159/91    Lab Results  Component Value Date   NA 136 05/26/2014   K 4.6 05/26/2014   CREATININE 1.43* 05/26/2014    Assessment: Blood pressure control:  not controlled Progress toward BP goal:   not at goal Comments: He is on lisinopril 40mg  daily (increased from 20mg  daily during his visit on 2/16). He has stage 2 hypertension and therefore a second agent is recommended.   Plan: Medications:  continue lisinopril, add CCB, Norvasc 5mg  daily, may need this titrated up to 10mg  daily Educational resources provided: brochure, handout, video Self management tools provided:   Other plans: Follow up in 2-4 weeks. Was given BP log sheet and encouraged to check BP at home.

## 2014-06-16 NOTE — Patient Instructions (Signed)
General Instructions: -Start taking Tylenol 500mg  every 6 hours for pain. You may take up to 8 tablets in 24 hours.  -Start taking Norvasc 5mg  daily in addition to the lisinopril for your blood pressure. - Please ask the physical therapist about specific therapy for phantom limb pain and ask them to check your blood pressure.  -Increase Lantus to 44 units at bedtime. Continue checking your blood sugars 3 times per day.  -Follow up with [place] in 2-4 weeks.   Thank you for bringing your medicines today. This helps [place] keep you safe from mistakes.   Progress Toward Treatment Goals:  Treatment Goal 05/26/2014  Hemoglobin A1C unchanged  Blood pressure unchanged  Stop smoking -    Self Care Goals & Plans:  Self Care Goal 06/16/2014  Manage my medications take my medicines as prescribed; bring my medications to every visit; refill my medications on time; follow the sick day instructions if I am sick  Monitor my health keep track of my blood glucose; bring my glucose meter and log to each visit  Eat healthy foods eat more vegetables; eat fruit for snacks and desserts; eat smaller portions; eat foods that are low in salt; drink diet soda or water instead of juice or soda  Be physically active find an activity I enjoy    Home Blood Glucose Monitoring 05/26/2014  Check my blood sugar 3 times a day  When to check my blood sugar before breakfast; before lunch; before dinner     Care Management & Community Referrals:  Referral 05/26/2014  Referrals made for care management support none needed  Referrals made to community resources -

## 2014-06-17 DIAGNOSIS — G546 Phantom limb syndrome with pain: Secondary | ICD-10-CM | POA: Insufficient documentation

## 2014-06-17 NOTE — Assessment & Plan Note (Signed)
Has recurrent bilateral phantom limb pain. Does not want long term narcotic tx.  Will not refill Tylenol #3 at this time. Will take Tylenol 500mg  1-2 tablets q 6hr PRN for pain Encouraged pt to participate in PT and inquire about specific therapy for phantom limb pain

## 2014-06-17 NOTE — Assessment & Plan Note (Signed)
Previous viagra dose Rx not covered by Medicaid. No history of cardiac disease.   Rx sildenafil 20mg  3-4 tablets PRN prior to sexual activity.

## 2014-06-17 NOTE — Assessment & Plan Note (Signed)
He will start PT this week, started using his prosthetic legs this week. In good spirits but still experiencing phantom limb pain.  -He was advised to ask his physical therapist about specific therapy for phantom limb pain.

## 2014-06-19 ENCOUNTER — Ambulatory Visit: Payer: Medicaid Other | Attending: Orthopedic Surgery | Admitting: Physical Therapy

## 2014-06-19 ENCOUNTER — Encounter: Payer: Self-pay | Admitting: Physical Therapy

## 2014-06-19 DIAGNOSIS — Z89512 Acquired absence of left leg below knee: Secondary | ICD-10-CM | POA: Diagnosis not present

## 2014-06-19 DIAGNOSIS — R269 Unspecified abnormalities of gait and mobility: Secondary | ICD-10-CM | POA: Insufficient documentation

## 2014-06-19 DIAGNOSIS — Z7409 Other reduced mobility: Secondary | ICD-10-CM | POA: Diagnosis not present

## 2014-06-19 DIAGNOSIS — M24661 Ankylosis, right knee: Secondary | ICD-10-CM | POA: Insufficient documentation

## 2014-06-19 DIAGNOSIS — Z89511 Acquired absence of right leg below knee: Secondary | ICD-10-CM | POA: Insufficient documentation

## 2014-06-19 DIAGNOSIS — R2689 Other abnormalities of gait and mobility: Secondary | ICD-10-CM

## 2014-06-19 DIAGNOSIS — R29818 Other symptoms and signs involving the nervous system: Secondary | ICD-10-CM | POA: Insufficient documentation

## 2014-06-19 NOTE — Progress Notes (Signed)
Internal Medicine Clinic Attending Date of visit: 06/16/2014  Case discussed with Dr. Hayes Ludwig soon after the resident saw the patient on the day of the visit .  We reviewed the resident's history and exam and pertinent patient test results.  I agree with the assessment, diagnosis, and plan of care documented in the resident's note.

## 2014-06-19 NOTE — Therapy (Signed)
[facility] [address] [place], [place], [address] Phone: [phone]   Fax:  [phone]  Physical Therapy Evaluation  Patient Details  Name: [name] MRN: [id number] Date of Birth: Oct 29, 1965 Referring Provider:  Newt Minion, MD  Encounter Date: 06/19/2014      PT End of Session - 06/19/14 0930    Visit Number 1   Number of Visits 9   PT Start Time 0930   PT Stop Time 1013   PT Time Calculation (min) 43 min   Equipment Utilized During Treatment Gait belt   Activity Tolerance Patient tolerated treatment well   Behavior During Therapy [facility] for tasks assessed/performed      Past Medical History  Diagnosis Date  . Onychomycosis   . Hypertension   . Diabetic peripheral neuropathy   . Diabetic foot ulcer     s/p Right first ray amputation, left transmetatarsal amputation with revision  . History of drug abuse     Cocaine and marijuana  . Exposure to trichomonas     treated empirically  . Cellulitis of left foot 03/2008    left 4th and 5th metatarsal area  . Hyperlipidemia   . 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 Mallory [name] tear April 2009  . History of prolonged Q-T interval on ECG   . 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  . 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 CKD (chronic kidney disease) stage 2, GFR 60-89 ml/min     BL SCr 1.3-1.4  . Depression   . Peripheral vascular disease   . Type 2 diabetes mellitus, uncontrolled, with renal complications   . GERD (gastroesophageal reflux disease)   . 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 Recurrent Foot Osteomyelitis 02/19/2012    Recurrent osteomyelitis of toes with multiple ray amputations Follows up  with Dr Sharol Given   . 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 Cellulitis and abscess of leg 01/31/2014  . Osteomyelitis of left leg 01/21/2014  . History of left below knee amputation 01/21/2014    Performed on    . Status post below knee amputation of right lower extremity 03/23/2014    Past Surgical History  Procedure Laterality Date  . Toe amputation  06/2011    left; 4th toe  . Nephrectomy      L side  . I&d extremity  08/24/2011    Procedure: IRRIGATION AND DEBRIDEMENT EXTREMITY;  Surgeon: Meredith Pel, MD;  Location: [facility];  Service: Orthopedics;  Laterality: Left;  . Amputation  08/24/2011    Procedure: AMPUTATION RAY;  Surgeon: Meredith Pel, MD;  Location: [facility];  Service: Orthopedics;  Laterality: Left;  left fourth toe Ray Resection  . Amputation  12/05/2011    Procedure: AMPUTATION RAY;  Surgeon: Meredith Pel, MD;  Location: [facility];  Service: Orthopedics;  Laterality: Left;  . I&d extremity  02/18/2012    Procedure: IRRIGATION AND DEBRIDEMENT EXTREMITY;  Surgeon: Mcarthur Rossetti, MD;  Location: [facility];  Service: Orthopedics;  Laterality: Left;  . Amputation  02/18/2012    Procedure: AMPUTATION DIGIT;  Surgeon: Mcarthur Rossetti, MD;  Location: [facility];  Service: Orthopedics;  Laterality: Left;  revison transmetatarsal  . I&d extremity Left 08/28/2012    Procedure: IRRIGATION AND DEBRIDEMENT EXTREMITY;  Surgeon: Linna Hoff, MD;  Location: [facility];  Service: Orthopedics;  Laterality: Left;  CYSTO TUBING/IRRIGATION, LEAD HAND.  . I&d extremity Left 08/30/2012    Procedure: IRRIGATION AND DEBRIDEMENT EXTREMITY;  Surgeon: Linna Hoff, MD;  Location: [facility];  Service: Orthopedics;  Laterality: Left;  . Amputation Right 09/01/2012    Procedure: FOOT 1ST RAY AMPUTATION;  Surgeon: Newt Minion, MD;  Location: [facility];  Service: Orthopedics;  Laterality: Right;  . Amputation Left 09/01/2012    Procedure: FOOT REVISION TRANSMETATARSAL AMPUTATION ;   Surgeon: Newt Minion, MD;  Location: [facility];  Service: Orthopedics;  Laterality: Left;  . Amputation Right 09/13/2012    Procedure: AMPUTATION BELOW KNEE;  Surgeon: Newt Minion, MD;  Location: [facility];  Service: Orthopedics;  Laterality: Right;  Right Below Knee Amputation  . Amputation Left 12/12/2013    Procedure: Left Below Knee Amputation;  Surgeon: Newt Minion, MD;  Location: [facility];  Service: Orthopedics;  Laterality: Left;  . Stump revision Left 01/23/2014    Procedure: STUMP REVISION;  Surgeon: Newt Minion, MD;  Location: [facility];  Service: Orthopedics;  Laterality: Left;    There were no vitals filed for this visit.  Visit Diagnosis:  Abnormality of gait  Balance problems  Impaired functional mobility and activity tolerance  Decreased range of motion of knee, right  Status post bilateral below knee amputation      Subjective Assessment - 06/19/14 2037    Pain Orientation Right;Left            [facility] PT Assessment - 06/19/14 0930    Assessment   Medical Diagnosis Bilateral Transtibial Amputations Prostheses   Onset Date 06/08/14   Precautions   Precautions Fall   Restrictions   Weight Bearing Restrictions No   Balance Screen   Has the patient fallen in the past 6 months No   Has the patient had a decrease in activity level because of a fear of falling?  Yes   Is the patient reluctant to leave their home because of a fear of falling?  No   Home Environment   Living Enviornment Private residence   Living Arrangements Spouse/significant other;Other relatives   brother, has 2 teenage children in area   Type of [place] One level  garage (his work room) & sun room - 3 steps   Prior Function   Level of Independence Independent with basic ADLs;Independent with homemaking with ambulation;Independent with gait;Independent with transfers  community with single prosthesis no device   Cognition   Overall Cognitive Status  Within Functional Limits for tasks assessed   Observation/Other Assessments   Focus on Therapeutic Outcomes (FOTO)  19  Functional Status   Fear Avoidance Belief Questionnaire (FABQ)  66%   ROM / Strength   AROM / PROM / Strength PROM;Strength   AROM   Right Knee Extension -19   PROM   Overall PROM  Deficits   Overall PROM Comments hip flexors appear tight   PROM Assessment Site Knee   Right/Left Knee Right   Strength   Overall Strength Within functional limits for tasks performed   Overall Strength Comments tested in siiting only, grossly WFL   Transfers   Sit to Stand 5: Supervision;From elevated surface;With upper extremity assist;With armrests;From chair/3-in-1  to walker for stabilization   Stand to Sit 5: Supervision;With upper extremity assist;With armrests;To chair/3-in-1  from walker for stabilzation   Ambulation/Gait   Ambulation/Gait Yes   Ambulation/Gait Assistance 4: Min guard   Ambulation Distance (Feet) 100 Feet   Assistive device Rolling walker;Prostheses   Gait Pattern Step-through pattern;Decreased stance time - left;Decreased step length - right;Decreased stride length;Right hip hike;Left hip hike;Right flexed knee in stance;Trunk flexed;Wide base of support   Ambulation Surface Level;Indoor   Gait velocity 1.07 ft/sec   Berg Balance Test   Sit to Stand Needs minimal aid to stand or to stabilize  needs minA to stabilize or walker   Standing Unsupported Unable to stand 30 seconds unassisted  stands 10sec with minA, stands 2 min with RW support   Sitting with Back Unsupported but Feet Supported on Floor or Stool Able to sit safely and securely 2 minutes   Stand to Sit Sits independently, has uncontrolled descent  uses walker to stabilize to sit   Transfers Able to transfer safely, definite need of hands   Standing Unsupported with Eyes Closed Needs help to keep from falling  modA without UE suppport, supervision with walker   Standing Ubsupported with Feet  Together Needs help to attain position and unable to hold for 15 seconds  without UE support max A to attain position & modA to hold 5   From Standing, Reach Forward with Outstretched Arm Loses balance while trying/requires external support  without UE support minA to reach 1", reach 5" with walker   From Standing Position, Pick up Object from Floor Unable to try/needs assist to keep balance  With RW minA   From Standing Position, Turn to Look Behind Over each Shoulder Needs assist to keep from losing balance and falling  with walker support looks behind one side only with SBA   Turn 360 Degrees Needs assistance while turning  mod A with walker   Standing Unsupported, Alternately Place Feet on Step/Stool Needs assistance to keep from falling or unable to try  modA with walker   Standing Unsupported, One Foot in [facility] balance while stepping or standing  takes small step & hold 30 sec with walker with SBA   Standing on One Leg Unable to try or needs assist to prevent fall  with walker stands 10sec on right leg & 10sec on left leg   Total Score 9         Prosthetics Assessment - 06/19/14 0001    Prosthetics   Prosthetic Care Dependent with Skin check;Residual limb care;Prosthetic cleaning;Ply sock cleaning;Correct ply sock adjustment;Proper wear schedule/adjustment;Proper weight-bearing schedule/adjustment  proper donning   Donning prosthesis  Supervision   Doffing prosthesis  Supervision   Current prosthetic wear tolerance (days/week)  worn bil. prostheses daily for 11 days since delivery   Current prosthetic wear tolerance (#hours/day)  progressively increased to 8 hours straight bilaterally   Current prosthetic weight-bearing tolerance (hours/day)  tol standing 5 min with walker with pain 7/10   Residual limb condition  no open areas                  [facility] Adult PT Treatment/Exercise - 06/19/14 0001    Prosthetics   Education Provided Skin check;Residual limb  care;Prosthetic cleaning;Correct ply sock adjustment;Ply sock cleaning;Proper wear schedule/adjustment;Proper weight-bearing schedule/adjustment  donning prosthesis   Person(s) Educated Patient;Spouse   Education Method Explanation;Demonstration   Education Method Verbalized understanding;Needs further instruction  PT Education - 06/19/14 0930    Education provided Yes   Education Details see prosthetic care, wear right prosthesis all awake hours but dry 3-4 x/day, wear left prosthesis 4hrs/day 2x/day, increase weekly to 5 hrs 2x/day, 6 hrs  2x/day if no issues. Dry limb & liner 1/2 way each wear   Person(s) Educated Patient;Spouse   Methods Explanation;Demonstration   Comprehension Verbalized understanding;Need further instruction          PT Short Term Goals - 06/19/14 0930    PT SHORT TERM GOAL #1   Title wears right prosthesis >90% of awake hours & left prosthesis >75% of awake hours with no skin issues & pain <4/10 ((Target Date: 4th visit after eval)   Baseline He has progressed wear of bilateral prostheses to 8 hours straight but pain up to 8/10 and skin is too wet placing at risk of breakdown.   Status New   PT SHORT TERM GOAL #2   Title verballized proper prosthetic care except cues on adjusting ply socks. ((Target Date: 4th visit after eval)   Baseline patient is dependent in all aspects of prosthetic care.   Status New   PT SHORT TERM GOAL #3   Title ambulates 400' with rolling walker & prostheses modified independent. ((Target Date: 4th visit after eval)   Baseline Patient ambulates 100' with rolling walker & prostheses with supervision / cues on safety.   Status New   PT SHORT TERM GOAL #4   Title negotiates ramp & curb with rolling walker and stairs with 2 rails with bilateral prostheses with supervision. ((Target Date: 4th visit after eval)   Baseline patient is non-ambulatory on barriers with bilateral prostheses.   Status New   PT SHORT TERM  GOAL #5   Title stands without UE support 1 minute & reaches 4" with supervision. ((Target Date: 4th visit after eval)   Baseline stands 10 sec & reaches 2" with minA without UE support.   Status New           PT Long Term Goals - 06/19/14 0930    PT LONG TERM GOAL #1   Title tolerates wear of bilateral prostheses >90% of awake hours without skin issues & pain </= 2/10 (Target Date: 8th visit after eval)   Baseline He has progressed wear of bilateral prostheses to 8 hours straight but pain up to 8/10 and skin is too wet placing at risk of breakdown.   Status New   PT LONG TERM GOAL #2   Title independent with prosthetic care. (Target Date: 8th visit after eval)   Baseline Patient is dependent in prosthetic care.   Status New   PT LONG TERM GOAL #3   Title ambulates 500' with LRAD & prostheses modified independent. (Target Date: 8th visit after eval)   Baseline Patient ambulates 100' with rolling walker & prostheses with supervision / cues for safety.   Status New   PT LONG TERM GOAL #4   Title negotiates ramp, curb & stairs with LRAD & prostheses modified independent. (Target Date: 8th visit after eval)   Baseline Patient is non-ambulatory with bilateral prostheses on barriers.   Status New   PT LONG TERM GOAL #5   Title Berg Balance >36/56 (Target Date: 8th visit after eval)   Baseline Berg Balance 9/56   Status New               Plan - 06/19/14 0930    Clinical Impression Statement This 49yo male was fully  functional with right prosthesis when he had a unilateral amputation. He underwent a left Transtibial Amputation on 12/12/13 with revision on 01/23/14. He recieved his first left prosthesis on 06/08/14 and right socket revision with different suspension method. He is dependent in safe use of prostheses. He reports he never was trained with first prosthesis so he needs instruction to safely progress to function use. His gait & balance with bilateral prostheses is dependent &  high risk of falls.              Pt will benefit from skilled therapeutic intervention in order to improve on the following deficits Abnormal gait;Decreased activity tolerance;Decreased balance;Decreased knowledge of use of DME;Decreased mobility;Decreased range of motion;Other (comment)  prosthetic dependency   Rehab Potential Good   PT Frequency 1x / week   PT Duration 8 weeks   PT Treatment/Interventions ADLs/Self Care Home Management;DME Instruction;Gait training;Stair training;Functional mobility training;Therapeutic activities;Therapeutic exercise;Balance training;Neuromuscular re-education;Patient/family education;Other (comment)  prosthetic training   PT Next Visit Plan prosthetic care instruction, gait with walker including barriers   PT Home Exercise Plan mid-line HEP, seated 4 point sequencing    Consulted and Agree with Plan of Care Patient;Family member/caregiver   Family Member Consulted wife         Problem List Patient Active Problem List   Diagnosis Date Noted  . Phantom limb pain 06/17/2014  . CN (constipation) 05/26/2014  . Erectile dysfunction 05/26/2014  . Status post below knee amputation of right lower extremity 03/23/2014  . Anemia, iron deficiency 02/04/2014  . History of left below knee amputation 01/21/2014  . Below knee amputation status 12/12/2013  . S/P Right BKA  10/09/2012  . Hyperlipedemia 06/24/2012  . GERD (gastroesophageal reflux disease) 03/13/2012  . Depression 09/05/2011  . Marginal Housing 03/28/2011  . Essential hypertension 09/22/2008  . DIABETIC PERIPHERAL NEUROPATHY 07/21/2008  . CKD Stage 2 07/21/2008  . TOBACCO ABUSE 01/03/2006  . DRUG ABUSE 01/03/2006  . Diabetes mellitus type 2, uncontrolled, with complications 02/10/1593    Jamey Reas PT, DPT 06/19/2014, 9:15 PM  [facility] [address] [place], [place], [address] Phone: [phone]   Fax:   [phone]

## 2014-07-08 ENCOUNTER — Encounter: Payer: Self-pay | Admitting: Physical Therapy

## 2014-07-08 ENCOUNTER — Other Ambulatory Visit: Payer: Self-pay | Admitting: *Deleted

## 2014-07-08 ENCOUNTER — Ambulatory Visit: Payer: Medicaid Other | Admitting: Physical Therapy

## 2014-07-08 DIAGNOSIS — IMO0002 Reserved for concepts with insufficient information to code with codable children: Secondary | ICD-10-CM

## 2014-07-08 DIAGNOSIS — R269 Unspecified abnormalities of gait and mobility: Secondary | ICD-10-CM

## 2014-07-08 DIAGNOSIS — Z89512 Acquired absence of left leg below knee: Secondary | ICD-10-CM

## 2014-07-08 DIAGNOSIS — Z89511 Acquired absence of right leg below knee: Secondary | ICD-10-CM

## 2014-07-08 DIAGNOSIS — R2689 Other abnormalities of gait and mobility: Secondary | ICD-10-CM

## 2014-07-08 DIAGNOSIS — Z7409 Other reduced mobility: Secondary | ICD-10-CM

## 2014-07-08 DIAGNOSIS — E1165 Type 2 diabetes mellitus with hyperglycemia: Secondary | ICD-10-CM

## 2014-07-08 DIAGNOSIS — E118 Type 2 diabetes mellitus with unspecified complications: Principal | ICD-10-CM

## 2014-07-08 NOTE — Therapy (Signed)
[facility] [address] [place], [place], [address] Phone: [phone]   Fax:  [phone]  Physical Therapy Treatment  Patient Details  Name: [name] MRN: [id number] Date of Birth: [birth year] Referring Provider:  Newt Minion, MD  Encounter Date: 07/08/2014      PT End of Session - 07/08/14 0845    Visit Number 2   Number of Visits 9   Authorization Type Medicaid   Authorization Time Period 8 visits 06/24/14 - 10/13/14   Authorization - Visit Number 1   Authorization - Number of Visits 8   PT Start Time 0845   PT Stop Time 0930   PT Time Calculation (min) 45 min   Equipment Utilized During Treatment Gait belt   Activity Tolerance Patient tolerated treatment well   Behavior During Therapy [facility] for tasks assessed/performed      Past Medical History  Diagnosis Date  . Onychomycosis   . Hypertension   . Diabetic peripheral neuropathy   . Diabetic foot ulcer     s/p Right first ray amputation, left transmetatarsal amputation with revision  . History of drug abuse     Cocaine and marijuana  . Exposure to trichomonas     treated empirically  . Cellulitis of left foot 03/2008    left 4th and 5th metatarsal area  . Hyperlipidemia   . 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 Ulcerative esophagitis 4174    severe, complicated with UGI bleed  . Mallory [name] tear April 2009  . History of prolonged Q-T interval on ECG   . 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  . 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 CKD (chronic kidney disease) stage 2, GFR 60-89 ml/min     BL SCr 1.3-1.4  . Depression   . Peripheral vascular disease   . Type 2 diabetes mellitus, uncontrolled, with renal complications   . GERD (gastroesophageal reflux disease)   . Hx of right BKA 09/13/2012     Due to  severe wound infection with sepsis  . 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 Cellulitis and abscess of leg 01/31/2014  . Osteomyelitis of left leg 01/21/2014  . History of left below knee amputation 01/21/2014    Performed on    . Status post below knee amputation of right lower extremity 03/23/2014    Past Surgical History  Procedure Laterality Date  . Toe amputation  06/2011    left; 4th toe  . Nephrectomy      L side  . I&d extremity  08/24/2011    Procedure: IRRIGATION AND DEBRIDEMENT EXTREMITY;  Surgeon: Meredith Pel, MD;  Location: [facility];  Service: Orthopedics;  Laterality: Left;  . Amputation  08/24/2011    Procedure: AMPUTATION RAY;  Surgeon: Meredith Pel, MD;  Location: [facility];  Service: Orthopedics;  Laterality: Left;  left fourth toe Ray Resection  . Amputation  12/05/2011    Procedure: AMPUTATION RAY;  Surgeon: Meredith Pel, MD;  Location: [facility];  Service: Orthopedics;  Laterality: Left;  . I&d extremity  02/18/2012    Procedure: IRRIGATION AND DEBRIDEMENT EXTREMITY;  Surgeon: Mcarthur Rossetti, MD;  Location:  [facility];  Service: Orthopedics;  Laterality: Left;  . Amputation  02/18/2012    Procedure: AMPUTATION DIGIT;  Surgeon: Mcarthur Rossetti, MD;  Location: [facility];  Service: Orthopedics;  Laterality: Left;  revison transmetatarsal  . I&d extremity Left 08/28/2012    Procedure: IRRIGATION AND DEBRIDEMENT EXTREMITY;  Surgeon: Linna Hoff, MD;  Location: [facility];  Service: Orthopedics;  Laterality: Left;  CYSTO TUBING/IRRIGATION, LEAD HAND.  . I&d extremity Left 08/30/2012    Procedure: IRRIGATION AND DEBRIDEMENT EXTREMITY;  Surgeon: Linna Hoff, MD;  Location: [facility];  Service: Orthopedics;  Laterality: Left;  . Amputation Right 09/01/2012    Procedure: FOOT 1ST RAY AMPUTATION;  Surgeon: Newt Minion, MD;  Location: [facility];  Service: Orthopedics;  Laterality: Right;  . Amputation Left 09/01/2012    Procedure: FOOT REVISION TRANSMETATARSAL AMPUTATION ;  Surgeon: Newt Minion, MD;  Location: [facility];  Service: Orthopedics;  Laterality: Left;  . Amputation Right 09/13/2012    Procedure: AMPUTATION BELOW KNEE;  Surgeon: Newt Minion, MD;  Location: [facility];  Service: Orthopedics;  Laterality: Right;  Right Below Knee Amputation  . Amputation Left 12/12/2013    Procedure: Left Below Knee Amputation;  Surgeon: Newt Minion, MD;  Location: [facility];  Service: Orthopedics;  Laterality: Left;  . Stump revision Left 01/23/2014    Procedure: STUMP REVISION;  Surgeon: Newt Minion, MD;  Location: [facility];  Service: Orthopedics;  Laterality: Left;    There were no vitals filed for this visit.  Visit Diagnosis:  Abnormality of gait  Balance problems  Impaired functional mobility and activity tolerance  Status post bilateral below knee amputation      Subjective Assessment - 07/08/14 0855    Symptoms wearing prosthesis 4 hrs 2xday as advised without issues.   Currently in Pain? Yes   Pain Score 8    Pain Location Leg   Pain Orientation Right;Left   Multiple Pain Sites No                       [facility] Adult PT Treatment/Exercise - 07/08/14 0845    Transfers   Sit to Stand 5: Supervision;With upper extremity assist;From chair/3-in-1  chair without armrests to RW pushing with UEs   Sit to Stand Details (indicate cue type and reason) PT demo / instructed in technique for chairs without armrests   Stand to Sit 5: Supervision;With upper extremity assist;To chair/3-in-1  RW to chair without armrests   Stand to Sit Details PT demo / instructed in technique for chairs without armrests   Ambulation/Gait   Ambulation/Gait Yes   Ambulation/Gait Assistance 5: Supervision   Ambulation/Gait Assistance Details PT demo sequence RW, RLE, RW, LLE, and equal step length clearing stance limb    Ambulation Distance (Feet) 130 Feet  2x   Assistive device Prostheses;Rolling walker   Gait Pattern Step-through pattern;Decreased stance time - left;Decreased step length - right;Decreased stride length;Right hip hike;Left hip hike;Right flexed knee in stance;Trunk flexed;Wide base of support   Ambulation Surface Level;Indoor   Stairs Yes   Stairs Assistance 5: Supervision   Stairs Assistance Details (indicate cue type and reason) PT demo / cued on technique   Stair Management Technique Two rails;Step to pattern;Forwards  prostheses, led with each LE 2 steps   Number of Stairs 4   Ramp 4: Min assist  RW & prostheses   Ramp Details (indicate cue type and reason) demo Sharlyne Pacas cues prior and  manual cues during   Curb 4: Min assist  RW & prostheses   Curb Details (indicate cue type and reason) demo Sharlyne Pacas cues prior and manual cues during   Prosthetics   Prosthetic Care Comments  donning with long pants with tight ankle area   Current prosthetic wear tolerance (days/week)  daily progressing wear time as PT advised   Current prosthetic wear tolerance (#hours/day)  up to 4 hours 2x/day for last week  PT increased to 5 hrs 2x/day   Residual limb condition  no open areas   Education Provided Skin check;Residual limb care;Correct ply sock adjustment;Proper wear schedule/adjustment;Other (comment)  sweating signs and drying prn plus 1/2 way thru each 5 hr we   Person(s) Educated Patient;Spouse   Education Method Explanation;Demonstration   Education Method Verbalized understanding;Returned demonstration;Needs further instruction                PT Education - 07/08/14 1230    Education provided Yes   Education Details see prosthetic care   Person(s) Educated Patient;Spouse   Methods Explanation;Demonstration;Verbal cues   Comprehension Verbalized understanding;Returned demonstration;Verbal cues required;Tactile cues required;Need further instruction          PT Short Term  Goals - 06/19/14 0930    PT SHORT TERM GOAL #1   Title wears right prosthesis >90% of awake hours & left prosthesis >75% of awake hours with no skin issues & pain <4/10 ((Target Date: 4th visit after eval)   Baseline He has progressed wear of bilateral prostheses to 8 hours straight but pain up to 8/10 and skin is too wet placing at risk of breakdown.   Status New   PT SHORT TERM GOAL #2   Title verballized proper prosthetic care except cues on adjusting ply socks. ((Target Date: 4th visit after eval)   Baseline patient is dependent in all aspects of prosthetic care.   Status New   PT SHORT TERM GOAL #3   Title ambulates 400' with rolling walker & prostheses modified independent. ((Target Date: 4th visit after eval)   Baseline Patient ambulates 100' with rolling walker & prostheses with supervision / cues on safety.   Status New   PT SHORT TERM GOAL #4   Title negotiates ramp & curb with rolling walker and stairs with 2 rails with bilateral prostheses with supervision. ((Target Date: 4th visit after eval)   Baseline patient is non-ambulatory on barriers with bilateral prostheses.   Status New   PT SHORT TERM GOAL #5   Title stands without UE support 1 minute & reaches 4" with supervision. ((Target Date: 4th visit after eval)   Baseline stands 10 sec & reaches 2" with minA without UE support.   Status New           PT Long Term Goals - 06/19/14 0930    PT LONG TERM GOAL #1   Title tolerates wear of bilateral prostheses >90% of awake hours without skin issues & pain </= 2/10 (Target Date: 8th visit after eval)   Baseline He has progressed wear of bilateral prostheses to 8 hours straight but pain up to 8/10 and skin is too wet placing at risk of breakdown.   Status New   PT LONG TERM GOAL #2   Title independent with prosthetic care. (Target Date: 8th visit after eval)   Baseline Patient is dependent in prosthetic care.   Status New   PT LONG TERM GOAL #3   Title ambulates 500' with  LRAD & prostheses modified independent. (  Target Date: 8th visit after eval)   Baseline Patient ambulates 100' with rolling walker & prostheses with supervision / cues for safety.   Status New   PT LONG TERM GOAL #4   Title negotiates ramp, curb & stairs with LRAD & prostheses modified independent. (Target Date: 8th visit after eval)   Baseline Patient is non-ambulatory with bilateral prostheses on barriers.   Status New   PT LONG TERM GOAL #5   Title Berg Balance >36/56 (Target Date: 8th visit after eval)   Baseline Berg Balance 9/56   Status New               Plan - 07/08/14 0845    Clinical Impression Statement Patient improved gait with cues on proper, equal step length and posture. He has general understanding of barriers but needs further instruction.,   Pt will benefit from skilled therapeutic intervention in order to improve on the following deficits Abnormal gait;Decreased activity tolerance;Decreased balance;Decreased knowledge of use of DME;Decreased mobility;Decreased range of motion;Other (comment)  prosthetic dependency   Rehab Potential Good   PT Frequency 1x / week   PT Duration 8 weeks   PT Treatment/Interventions ADLs/Self Care Home Management;DME Instruction;Gait training;Stair training;Functional mobility training;Therapeutic activities;Therapeutic exercise;Balance training;Neuromuscular re-education;Patient/family education;Other (comment)  prosthetic training   PT Next Visit Plan prosthetic care instruction, gait with walker including barriers   PT Home Exercise Plan mid-line HEP, seated 4 point sequencing    Consulted and Agree with Plan of Care Patient;Family member/caregiver   Family Member Consulted wife        Problem List Patient Active Problem List   Diagnosis Date Noted  . Phantom limb pain 06/17/2014  . CN (constipation) 05/26/2014  . Erectile dysfunction 05/26/2014  . Status post below knee amputation of right lower extremity 03/23/2014  .  Anemia, iron deficiency 02/04/2014  . History of left below knee amputation 01/21/2014  . Below knee amputation status 12/12/2013  . S/P Right BKA  10/09/2012  . Hyperlipedemia 06/24/2012  . GERD (gastroesophageal reflux disease) 03/13/2012  . Depression 09/05/2011  . Marginal Housing 03/28/2011  . Essential hypertension 09/22/2008  . DIABETIC PERIPHERAL NEUROPATHY 07/21/2008  . CKD Stage 2 07/21/2008  . TOBACCO ABUSE 01/03/2006  . DRUG ABUSE 01/03/2006  . Diabetes mellitus type 2, uncontrolled, with complications 39/06/90    Jamey Reas PT, DPT 07/08/2014, 3:02 PM  [facility] [address] [place], [place], [address] Phone: [phone]   Fax:  [phone]

## 2014-07-08 NOTE — Telephone Encounter (Signed)
Received faxed refill request from pt's pharmacy stating rx was filled today, but pharmacy is requesting additional refill to be placed on file until the patient is due for their next refill.Despina Hidden Cassady3/30/20163:14 PM

## 2014-07-09 MED ORDER — GLUCOSE BLOOD VI STRP: 1.0000 | ORAL_STRIP | Freq: Three times a day (TID) | Status: DC

## 2014-07-14 ENCOUNTER — Other Ambulatory Visit: Payer: Self-pay | Admitting: *Deleted

## 2014-07-14 DIAGNOSIS — I10 Essential (primary) hypertension: Secondary | ICD-10-CM

## 2014-07-14 DIAGNOSIS — N529 Male erectile dysfunction, unspecified: Secondary | ICD-10-CM

## 2014-07-14 MED ORDER — SILDENAFIL CITRATE 20 MG PO TABS: ORAL_TABLET | ORAL | Status: DC

## 2014-07-14 MED ORDER — AMLODIPINE BESYLATE 5 MG PO TABS: 5.0000 mg | ORAL_TABLET | Freq: Every day | ORAL | Status: DC

## 2014-07-15 ENCOUNTER — Encounter: Payer: Self-pay | Admitting: Physical Therapy

## 2014-07-15 ENCOUNTER — Ambulatory Visit: Payer: Medicaid Other | Attending: Orthopedic Surgery | Admitting: Physical Therapy

## 2014-07-15 VITALS — BP 158/89 | HR 102

## 2014-07-15 DIAGNOSIS — Z7409 Other reduced mobility: Secondary | ICD-10-CM | POA: Insufficient documentation

## 2014-07-15 DIAGNOSIS — Z89512 Acquired absence of left leg below knee: Secondary | ICD-10-CM | POA: Diagnosis not present

## 2014-07-15 DIAGNOSIS — R269 Unspecified abnormalities of gait and mobility: Secondary | ICD-10-CM | POA: Insufficient documentation

## 2014-07-15 DIAGNOSIS — M24661 Ankylosis, right knee: Secondary | ICD-10-CM | POA: Diagnosis not present

## 2014-07-15 DIAGNOSIS — R2689 Other abnormalities of gait and mobility: Secondary | ICD-10-CM

## 2014-07-15 DIAGNOSIS — R29818 Other symptoms and signs involving the nervous system: Secondary | ICD-10-CM | POA: Diagnosis not present

## 2014-07-15 DIAGNOSIS — Z89511 Acquired absence of right leg below knee: Secondary | ICD-10-CM | POA: Diagnosis not present

## 2014-07-15 NOTE — Therapy (Signed)
[facility] [address] [place], [place], [address] Phone: [phone]   Fax:  [phone]  Physical Therapy Treatment  Patient Details  Name: [name] MRN: [id number] Date of Birth: July 16, 1965 Referring Provider:  [name], MD  Encounter Date: 07/15/2014      PT End of Session - 07/15/14 1015    Visit Number 2   Number of Visits 9   Authorization Type Medicaid   Authorization Time Period 8 visits 06/24/14 - 10/13/14   Authorization - Visit Number 2   Authorization - Number of Visits 8   PT Start Time 0845   PT Stop Time 0930   PT Time Calculation (min) 45 min   Equipment Utilized During Treatment Gait belt   Activity Tolerance Patient tolerated treatment well   Behavior During Therapy [facility] for tasks assessed/performed      Past Medical History  Diagnosis Date  . Onychomycosis   . Hypertension   . Diabetic peripheral neuropathy   . Diabetic foot ulcer     s/p Right first ray amputation, left transmetatarsal amputation with revision  . History of drug abuse     Cocaine and marijuana  . Exposure to trichomonas     treated empirically  . Cellulitis of left foot 03/2008    left 4th and 5th metatarsal area  . Hyperlipidemia   . 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 Ulcerative esophagitis 6712    severe, complicated with UGI bleed  . Mallory [name] tear April 2009  . History of prolonged Q-T interval on ECG   . 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  . 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 CKD (chronic kidney disease) stage 2, GFR 60-89 ml/min     BL SCr 1.3-1.4  . Depression   . Peripheral vascular disease   . Type 2 diabetes mellitus, uncontrolled, with renal complications   . GERD (gastroesophageal reflux disease)   . Hx of right BKA 09/13/2012     Due  to severe wound infection with sepsis  . 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 Cellulitis and abscess of leg 01/31/2014  . Osteomyelitis of left leg 01/21/2014  . History of left below knee amputation 01/21/2014    Performed on    . Status post below knee amputation of right lower extremity 03/23/2014    Past Surgical History  Procedure Laterality Date  . Toe amputation  06/2011    left; 4th toe  . Nephrectomy      L side  . I&d extremity  08/24/2011    Procedure: IRRIGATION AND DEBRIDEMENT EXTREMITY;  Surgeon: Meredith Pel, MD;  Location: [place];  Service: Orthopedics;  Laterality: Left;  . Amputation  08/24/2011    Procedure: AMPUTATION RAY;  Surgeon: Meredith Pel, MD;  Location: [place];  Service: Orthopedics;  Laterality: Left;  left fourth toe Ray Resection  . Amputation  12/05/2011    Procedure: AMPUTATION RAY;  Surgeon: Meredith Pel, MD;  Location: [place];  Service: Orthopedics;  Laterality: Left;  . I&d extremity  02/18/2012    Procedure: IRRIGATION AND DEBRIDEMENT EXTREMITY;  Surgeon: Mcarthur Rossetti, MD;  [facility];  Service: Orthopedics;  Laterality: Left;  . Amputation  02/18/2012    Procedure: AMPUTATION DIGIT;  Surgeon: Mcarthur Rossetti, MD;  Location: [place];  Service: Orthopedics;  Laterality: Left;  revison transmetatarsal  . I&d extremity Left 08/28/2012    Procedure: IRRIGATION AND DEBRIDEMENT EXTREMITY;  Surgeon: Linna Hoff, MD;  Location: [place];  Service: Orthopedics;  Laterality: Left;  CYSTO TUBING/IRRIGATION, LEAD HAND.  . I&d extremity Left 08/30/2012    Procedure: IRRIGATION AND DEBRIDEMENT EXTREMITY;  Surgeon: Linna Hoff, MD;  Location: [place];  Service: Orthopedics;  Laterality: Left;  . Amputation Right 09/01/2012    Procedure: FOOT 1ST RAY AMPUTATION;  Surgeon: Newt Minion, MD;  Location: [place];  Service: Orthopedics;  Laterality: Right;  . Amputation Left 09/01/2012    Procedure: FOOT REVISION TRANSMETATARSAL AMPUTATION ;  Surgeon: Newt Minion, MD;  Location: [place];  Service: Orthopedics;  Laterality: Left;  . Amputation Right 09/13/2012    Procedure: AMPUTATION BELOW KNEE;  Surgeon: Newt Minion, MD;  Location: [place];  Service: Orthopedics;  Laterality: Right;  Right Below Knee Amputation  . Amputation Left 12/12/2013    Procedure: Left Below Knee Amputation;  Surgeon: Newt Minion, MD;  Location: [place];  Service: Orthopedics;  Laterality: Left;  . Stump revision Left 01/23/2014    Procedure: STUMP REVISION;  Surgeon: Newt Minion, MD;  Location: [place];  Service: Orthopedics;  Laterality: Left;    Filed Vitals:   07/15/14 0910  BP: 158/89  Pulse: 102    Visit Diagnosis:  Abnormality of gait  Balance problems  Impaired functional mobility and activity tolerance  Status post bilateral below knee amputation      Subjective Assessment - 07/15/14 0856    Subjective wearing prostheses 5hrs 2x/day without issues.   Currently in Pain? No/denies                       [facility] Adult PT Treatment/Exercise - 07/15/14 0845    Transfers   Sit to Stand 5: Supervision;With upper extremity assist;From chair/3-in-1  chair without armrests to RW pushing with UEs   Stand to Sit 5: Supervision;With upper extremity assist;To chair/3-in-1  RW to chair without armrests   Ambulation/Gait   Ambulation/Gait Yes   Ambulation/Gait Assistance 5: Supervision;4: Min guard  min guard initial steps with new device   Ambulation/Gait Assistance Details PT demo, verbal cued sequence and rationale for device progression   Ambulation Distance (Feet) 250 Feet  2x   Assistive device Prostheses;Rolling walker   Gait Pattern Step-through pattern;Decreased stance time - left;Decreased step length - right;Decreased stride length;Right hip hike;Left hip hike;Right  flexed knee in stance;Trunk flexed;Wide base of support   Ambulation Surface Level;Indoor   Stairs Yes   Stairs Assistance 5: Supervision   Stair Management Technique Two rails;Step to pattern;Forwards  prostheses, led with each LE 2 steps   Number of Stairs 4   Ramp 4: Min assist;5: Supervision  RW & prostheses   Ramp Details (indicate cue type and reason) verbal cues on technique with crutches   Curb 4: Min assist;5: Supervision  RW & prostheses   Curb Details (indicate cue type and reason) verbal cues on technique with crutches   Prosthetics   Prosthetic Care Comments  --   Current prosthetic wear tolerance (days/week)  daily progressing wear time as PT advised   Current prosthetic wear tolerance (#hours/day)  5 hrs 2x/day, PT increased to  6hrs 2x/day  PT increased to 5 hrs 2x/day   Residual limb condition  no open areas   Education Provided Skin check;Prosthetic cleaning;Correct ply sock adjustment;Proper wear schedule/adjustment  donning   Person(s) Educated Patient   Education Method Explanation;Demonstration   Education Method Verbalized understanding;Returned demonstration   Donning Prosthesis Supervision   Doffing Prosthesis Modified independent (device/increased time)                PT Education - 07/15/14 1429    Education provided Yes   Education Details see prosthetic care, crutch gait   Person(s) Educated Patient   Methods Explanation;Demonstration   Comprehension Verbalized understanding;Returned demonstration          PT Short Term Goals - 06/19/14 0930    PT SHORT TERM GOAL #1   Title wears right prosthesis >90% of awake hours & left prosthesis >75% of awake hours with no skin issues & pain <4/10 ((Target Date: 4th visit after eval)   Baseline He has progressed wear of bilateral prostheses to 8 hours straight but pain up to 8/10 and skin is too wet placing at risk of breakdown.   Status New   PT SHORT TERM GOAL #2   Title verballized proper  prosthetic care except cues on adjusting ply socks. ((Target Date: 4th visit after eval)   Baseline patient is dependent in all aspects of prosthetic care.   Status New   PT SHORT TERM GOAL #3   Title ambulates 400' with rolling walker & prostheses modified independent. ((Target Date: 4th visit after eval)   Baseline Patient ambulates 100' with rolling walker & prostheses with supervision / cues on safety.   Status New   PT SHORT TERM GOAL #4   Title negotiates ramp & curb with rolling walker and stairs with 2 rails with bilateral prostheses with supervision. ((Target Date: 4th visit after eval)   Baseline patient is non-ambulatory on barriers with bilateral prostheses.   Status New   PT SHORT TERM GOAL #5   Title stands without UE support 1 minute & reaches 4" with supervision. ((Target Date: 4th visit after eval)   Baseline stands 10 sec & reaches 2" with minA without UE support.   Status New           PT Long Term Goals - 06/19/14 0930    PT LONG TERM GOAL #1   Title tolerates wear of bilateral prostheses >90% of awake hours without skin issues & pain </= 2/10 (Target Date: 8th visit after eval)   Baseline He has progressed wear of bilateral prostheses to 8 hours straight but pain up to 8/10 and skin is too wet placing at risk of breakdown.   Status New   PT LONG TERM GOAL #2   Title independent with prosthetic care. (Target Date: 8th visit after eval)   Baseline Patient is dependent in prosthetic care.   Status New   PT LONG TERM GOAL #3   Title ambulates 500' with LRAD & prostheses modified independent. (Target Date: 8th visit after eval)   Baseline Patient ambulates 100' with rolling walker & prostheses with supervision / cues for safety.   Status New   PT LONG TERM GOAL #4   Title negotiates ramp, curb & stairs with LRAD & prostheses modified independent. (Target Date: 8th visit after eval)   Baseline Patient is non-ambulatory with bilateral prostheses on barriers.    Status New   PT LONG TERM GOAL #5   Title Berg Balance >36/56 (Target Date:  8th visit after eval)   Baseline Berg Balance 9/56   Status New               Plan - 07/15/14 0930    Clinical Impression Statement Patient appears safe to use crutches or walking sticks with prostheses for level surface limited distance gait.    Pt will benefit from skilled therapeutic intervention in order to improve on the following deficits Abnormal gait;Decreased activity tolerance;Decreased balance;Decreased knowledge of use of DME;Decreased mobility;Decreased range of motion;Other (comment)  prosthetic dependency   Rehab Potential Good   PT Frequency 1x / week   PT Duration 8 weeks   PT Treatment/Interventions ADLs/[name];DME Instruction;Gait training;Stair training;Functional mobility training;Therapeutic activities;Therapeutic exercise;Balance training;Neuromuscular re-education;Patient/family education;Other (comment)  prosthetic training   PT Next Visit Plan prosthetic care instruction, gait with walker including barriers   PT Home Exercise Plan mid-line HEP, seated 4 point sequencing    Consulted and Agree with Plan of Care Patient;Family member/caregiver   Family Member Consulted wife        Problem List Patient Active Problem List   Diagnosis Date Noted  . Phantom limb pain 06/17/2014  . CN (constipation) 05/26/2014  . Erectile dysfunction 05/26/2014  . Status post below knee amputation of right lower extremity 03/23/2014  . Anemia, iron deficiency 02/04/2014  . History of left below knee amputation 01/21/2014  . Below knee amputation status 12/12/2013  . S/P Right BKA  10/09/2012  . Hyperlipedemia 06/24/2012  . GERD (gastroesophageal reflux disease) 03/13/2012  . Depression 09/05/2011  . Marginal Housing 03/28/2011  . Essential hypertension 09/22/2008  . DIABETIC PERIPHERAL NEUROPATHY 07/21/2008  . CKD Stage 2 07/21/2008  . TOBACCO ABUSE 01/03/2006  . DRUG  ABUSE 01/03/2006  . Diabetes mellitus type 2, uncontrolled, with complications 94/49/6759    Jamey Reas PT, DPT 07/15/2014, 2:34 PM  [facility] [address] [place], [place], [address] Phone: [phone]   Fax:  [phone]

## 2014-07-22 ENCOUNTER — Ambulatory Visit: Payer: Medicaid Other | Admitting: Physical Therapy

## 2014-07-22 ENCOUNTER — Encounter: Payer: Self-pay | Admitting: Physical Therapy

## 2014-07-22 VITALS — BP 180/96 | HR 112

## 2014-07-22 DIAGNOSIS — R269 Unspecified abnormalities of gait and mobility: Secondary | ICD-10-CM | POA: Diagnosis not present

## 2014-07-22 DIAGNOSIS — M24661 Ankylosis, right knee: Secondary | ICD-10-CM

## 2014-07-22 DIAGNOSIS — R2689 Other abnormalities of gait and mobility: Secondary | ICD-10-CM

## 2014-07-22 DIAGNOSIS — Z7409 Other reduced mobility: Secondary | ICD-10-CM

## 2014-07-22 NOTE — Therapy (Signed)
[facility] [address] [place], [place], [address] Phone: [phone]   Fax:  [phone]  Physical Therapy Treatment  Patient Details  Name: [name] MRN: [id number] Date of Birth: 1965/08/23 Referring Provider:  Newt Minion, MD  Encounter Date: 07/22/2014      PT End of Session - 07/22/14 0854    Visit Number 3   Number of Visits 9   Authorization Type Medicaid   Authorization Time Period 8 visits 06/24/14 - 10/13/14   Authorization - Visit Number 3   Authorization - Number of Visits 8   PT Start Time 0630   PT Stop Time 0930   PT Time Calculation (min) 43 min   Equipment Utilized During Treatment Gait belt   Activity Tolerance Patient tolerated treatment well   Behavior During Therapy [facility] for tasks assessed/performed      Past Medical History  Diagnosis Date  . Onychomycosis   . Hypertension   . Diabetic peripheral neuropathy   . Diabetic foot ulcer     s/p Right first ray amputation, left transmetatarsal amputation with revision  . History of drug abuse     Cocaine and marijuana  . Exposure to trichomonas     treated empirically  . Cellulitis of left foot 03/2008    left 4th and 5th metatarsal area  . Hyperlipidemia   . 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 Ulcerative esophagitis 3235    severe, complicated with UGI bleed  . Mallory [name] tear April 2009  . History of prolonged Q-T interval on ECG   . 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  . 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 CKD (chronic kidney disease) stage 2, GFR 60-89 ml/min     BL SCr 1.3-1.4  . Depression   . Peripheral vascular disease   . Type 2 diabetes mellitus, uncontrolled, with renal complications   . GERD (gastroesophageal reflux disease)   . Hx of right BKA 09/13/2012     Due to  severe wound infection with sepsis  . 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 Cellulitis and abscess of leg 01/31/2014  . Osteomyelitis of left leg 01/21/2014  . History of left below knee amputation 01/21/2014    Performed on    . Status post below knee amputation of right lower extremity 03/23/2014    Past Surgical History  Procedure Laterality Date  . Toe amputation  06/2011    left; 4th toe  . Nephrectomy      L side  . I&d extremity  08/24/2011    Procedure: IRRIGATION AND DEBRIDEMENT EXTREMITY;  Surgeon: Meredith Pel, MD;  Location: [facility];  Service: Orthopedics;  Laterality: Left;  . Amputation  08/24/2011    Procedure: AMPUTATION RAY;  Surgeon: Meredith Pel, MD;  Location: [facility];  Service: Orthopedics;  Laterality: Left;  left fourth toe Ray Resection  . Amputation  12/05/2011    Procedure: AMPUTATION RAY;  Surgeon: Meredith Pel, MD;  Location: [facility];  Service: Orthopedics;  Laterality: Left;  . I&d extremity  02/18/2012    Procedure: IRRIGATION AND DEBRIDEMENT EXTREMITY;  Surgeon: Mcarthur Rossetti, MD;  Location:  [facility];  Service: Orthopedics;  Laterality: Left;  . Amputation  02/18/2012    Procedure: AMPUTATION DIGIT;  Surgeon: Mcarthur Rossetti, MD;  Location: [facility];  Service: Orthopedics;  Laterality: Left;  revison transmetatarsal  . I&d extremity Left 08/28/2012    Procedure: IRRIGATION AND DEBRIDEMENT EXTREMITY;  Surgeon: Linna Hoff, MD;  Location: [facility];  Service: Orthopedics;  Laterality: Left;  CYSTO TUBING/IRRIGATION, LEAD HAND.  . I&d extremity Left 08/30/2012    Procedure: IRRIGATION AND DEBRIDEMENT EXTREMITY;  Surgeon: Linna Hoff, MD;  Location: [facility];  Service: Orthopedics;  Laterality: Left;  . Amputation Right 09/01/2012    Procedure: FOOT 1ST RAY AMPUTATION;  Surgeon: Newt Minion, MD;  Location: [facility];  Service: Orthopedics;  Laterality: Right;  . Amputation Left 09/01/2012    Procedure: FOOT REVISION TRANSMETATARSAL AMPUTATION ;  Surgeon: Newt Minion, MD;  Location: [facility];  Service: Orthopedics;  Laterality: Left;  . Amputation Right 09/13/2012    Procedure: AMPUTATION BELOW KNEE;  Surgeon: Newt Minion, MD;  Location: [facility];  Service: Orthopedics;  Laterality: Right;  Right Below Knee Amputation  . Amputation Left 12/12/2013    Procedure: Left Below Knee Amputation;  Surgeon: Newt Minion, MD;  Location: [facility];  Service: Orthopedics;  Laterality: Left;  . Stump revision Left 01/23/2014    Procedure: STUMP REVISION;  Surgeon: Newt Minion, MD;  Location: [facility];  Service: Orthopedics;  Laterality: Left;    Filed Vitals:   07/22/14 0852- automated machine 07/22/14 0903- manual recheck  BP: 190/101 180/96  Pulse: 116 112    Visit Diagnosis:  Abnormality of gait  Balance problems  Impaired functional mobility and activity tolerance  Decreased range of motion of knee, right      Subjective Assessment - 07/22/14 0852    Subjective No new compaints. No falls or pain to report. Reports his sugar dropped to 44 this am, took medicine and ate like he is suppossed to do. Feeling okay now, no dizziness.   Currently in Pain? No/denies          [facility] Adult PT Treatment/Exercise - 07/22/14 0853    Transfers   Sit to Stand 5: Supervision;With upper extremity assist;From chair/3-in-1   Stand to Sit 5: Supervision;With upper extremity assist;To chair/3-in-1   Ambulation/Gait   Ambulation/Gait Yes   Ambulation/Gait Assistance 4: Min guard   Ambulation/Gait Assistance Details pt using his home made walking sticks, cues on posture, decreased base of support and for equal step length. pt prefers 2 point gait pattern over a 4 point gait pattern.                            Ambulation Distance (Feet) 250 Feet  x 1 rep;120 x 2 reps   Assistive device Prostheses;Other  (Comment)  walking sticks   Gait Pattern Step-through pattern;Decreased stance time - left;Decreased step length - right;Decreased stride length;Right hip hike;Left hip hike;Right flexed knee in stance;Trunk flexed;Wide base of support   Ambulation Surface Level;Indoor   Stairs Yes   Stairs Assistance 4: Min guard   Stairs Assistance Details (indicate cue type and reason) cues on sequence with one rail/walking sticks and on prosthetic foot position with stair negotiation                            Stair Management Technique One rail Right;Step to pattern;Forwards  with walking sticks   Number of Stairs 4   Ramp 4: Min assist  prostheses/walking sticks   Ramp Details (indicate cue type and reason) cues on sequence/technique with walking sticks   Curb 4: Min assist  prostheses/walking sticks   Curb Details (indicate cue type and reason) cues on sequence and foot placement with using walking sticks   Prosthetics   Prosthetic Care Comments  use of socks to relieve pressure with increased wear and for proper fit   Current prosthetic wear tolerance (days/week)  daily   Current prosthetic wear tolerance (#hours/day)  6 hours 2 x day   Edema slight swelling left tibial area from increased wear   Residual limb condition  intact both legs   Education Provided Skin check;Correct ply sock adjustment;Proper wear schedule/adjustment   Person(s) Educated Patient   Education Method Explanation   Education Method Verbalized understanding;Tactile cues required   Donning Prosthesis Supervision   Doffing Prosthesis Modified independent (device/increased time)           PT Short Term Goals - 06/19/14 0930    PT SHORT TERM GOAL #1   Title wears right prosthesis >90% of awake hours & left prosthesis >75% of awake hours with no skin issues & pain <4/10 ((Target Date: 4th visit after eval)   Baseline He has progressed wear of bilateral prostheses to 8 hours straight but pain up to 8/10 and skin is too  wet placing at risk of breakdown.   Status New   PT SHORT TERM GOAL #2   Title verballized proper prosthetic care except cues on adjusting ply socks. ((Target Date: 4th visit after eval)   Baseline patient is dependent in all aspects of prosthetic care.   Status New   PT SHORT TERM GOAL #3   Title ambulates 400' with rolling walker & prostheses modified independent. ((Target Date: 4th visit after eval)   Baseline Patient ambulates 100' with rolling walker & prostheses with supervision / cues on safety.   Status New   PT SHORT TERM GOAL #4   Title negotiates ramp & curb with rolling walker and stairs with 2 rails with bilateral prostheses with supervision. ((Target Date: 4th visit after eval)   Baseline patient is non-ambulatory on barriers with bilateral prostheses.   Status New   PT SHORT TERM GOAL #5   Title stands without UE support 1 minute & reaches 4" with supervision. ((Target Date: 4th visit after eval)   Baseline stands 10 sec & reaches 2" with minA without UE support.   Status New           PT Long Term Goals - 06/19/14 0930    PT LONG TERM GOAL #1   Title tolerates wear of bilateral prostheses >90% of awake hours without skin issues & pain </= 2/10 (Target Date: 8th visit after eval)   Baseline He has progressed wear of bilateral prostheses to 8 hours straight but pain up to 8/10 and skin is too wet placing at risk of breakdown.   Status New   PT LONG TERM GOAL #2   Title independent with prosthetic care. (Target Date: 8th visit after eval)   Baseline Patient is dependent in prosthetic care.   Status New   PT LONG TERM GOAL #3   Title ambulates 500' with LRAD & prostheses modified independent. (Target Date: 8th visit after eval)   Baseline Patient ambulates 100' with rolling walker & prostheses with supervision / cues for safety.   Status New  PT LONG TERM GOAL #4   Title negotiates ramp, curb & stairs with LRAD & prostheses modified independent. (Target Date: 8th  visit after eval)   Baseline Patient is non-ambulatory with bilateral prostheses on barriers.   Status New   PT LONG TERM GOAL #5   Title Berg Balance >36/56 (Target Date: 8th visit after eval)   Baseline Berg Balance 9/56   Status New           Plan - 07/22/14 0854    Clinical Impression Statement Pt's manual BP low enough for light work with therapy. Pt edcuated on importance of taking his BP medicine (reports he stopped taking in due to it makes him swell). Advised pt to call his MD about this for recommendations/advise on the side effects of the medicine. Pt also with low sugar this am that he treated with medicine/food. No issues with session (dizziness, lightheadednes or headache) reported. However after session pt with massive vomiting episode in office bathroom. BP elevated again most likely due to the vomiting. Pt recovered after several minutes and was able to return to the waiting room to wait on his transportation. Pt advised to seek immediated medical treatment if his symptoms continued/worsened or if chest pain/headache developed. Pt verbalized understanding. Pt reported he would call his MD about the BP medicine as well. Progressing with use of walking sticks toward goals.                                                                                        Pt will benefit from skilled therapeutic intervention in order to improve on the following deficits Abnormal gait;Decreased activity tolerance;Decreased balance;Decreased knowledge of use of DME;Decreased mobility;Decreased range of motion;Other (comment)  prosthetic dependency   Rehab Potential Good   PT Frequency 1x / week   PT Duration 8 weeks   PT Treatment/Interventions ADLs/Self Care Home Management;DME Instruction;Gait training;Stair training;Functional mobility training;Therapeutic activities;Therapeutic exercise;Balance training;Neuromuscular re-education;Patient/family education;Other (comment)  prosthetic training    PT Next Visit Plan continue with walking sticks, assess STG's next visit. monitor BP   PT Home Exercise Plan mid-line HEP, seated 4 point sequencing    Consulted and Agree with Plan of Care Patient;Family member/caregiver   Family Member Consulted wife      Problem List Patient Active Problem List   Diagnosis Date Noted  . Phantom limb pain 06/17/2014  . CN (constipation) 05/26/2014  . Erectile dysfunction 05/26/2014  . Status post below knee amputation of right lower extremity 03/23/2014  . Anemia, iron deficiency 02/04/2014  . History of left below knee amputation 01/21/2014  . Below knee amputation status 12/12/2013  . S/P Right BKA  10/09/2012  . Hyperlipedemia 06/24/2012  . GERD (gastroesophageal reflux disease) 03/13/2012  . Depression 09/05/2011  . Marginal Housing 03/28/2011  . Essential hypertension 09/22/2008  . DIABETIC PERIPHERAL NEUROPATHY 07/21/2008  . CKD Stage 2 07/21/2008  . TOBACCO ABUSE 01/03/2006  . DRUG ABUSE 01/03/2006  . Diabetes mellitus type 2, uncontrolled, with complications 99/37/1696    Willow Ora 07/22/2014, 8:55 PM  Willow Ora, PTA, [facility] [address] [place], [place] [address] [phone]  07/22/2014, 8:55 PM

## 2014-07-23 ENCOUNTER — Telehealth: Payer: Self-pay | Admitting: Internal Medicine

## 2014-07-23 NOTE — Telephone Encounter (Signed)
Call to patient to confirm appointment for 07/27/14 at 9:45 lmtcb

## 2014-07-27 ENCOUNTER — Encounter: Payer: Self-pay | Admitting: Internal Medicine

## 2014-07-27 ENCOUNTER — Ambulatory Visit (INDEPENDENT_AMBULATORY_CARE_PROVIDER_SITE_OTHER): Payer: Medicaid Other | Admitting: Internal Medicine

## 2014-07-27 VITALS — BP 148/88 | HR 102 | Temp 98.1°F | Ht 69.5 in | Wt 230.8 lb

## 2014-07-27 DIAGNOSIS — Z89511 Acquired absence of right leg below knee: Secondary | ICD-10-CM

## 2014-07-27 DIAGNOSIS — Z794 Long term (current) use of insulin: Secondary | ICD-10-CM

## 2014-07-27 DIAGNOSIS — F329 Major depressive disorder, single episode, unspecified: Secondary | ICD-10-CM

## 2014-07-27 DIAGNOSIS — IMO0002 Reserved for concepts with insufficient information to code with codable children: Secondary | ICD-10-CM

## 2014-07-27 DIAGNOSIS — E1165 Type 2 diabetes mellitus with hyperglycemia: Secondary | ICD-10-CM

## 2014-07-27 DIAGNOSIS — G546 Phantom limb syndrome with pain: Secondary | ICD-10-CM | POA: Diagnosis not present

## 2014-07-27 DIAGNOSIS — E118 Type 2 diabetes mellitus with unspecified complications: Principal | ICD-10-CM

## 2014-07-27 DIAGNOSIS — F32A Depression, unspecified: Secondary | ICD-10-CM

## 2014-07-27 DIAGNOSIS — I10 Essential (primary) hypertension: Secondary | ICD-10-CM | POA: Diagnosis not present

## 2014-07-27 LAB — GLUCOSE, CAPILLARY: Glucose-Capillary: 216 mg/dL — ABNORMAL HIGH (ref 70–99)

## 2014-07-27 MED ORDER — ACETAMINOPHEN-CODEINE #3 300-30 MG PO TABS: 1.0000 | ORAL_TABLET | ORAL | Status: DC | PRN

## 2014-07-27 MED ORDER — INSULIN GLARGINE 100 UNIT/ML ~~LOC~~ SOLN: 44.0000 [IU] | Freq: Every morning | SUBCUTANEOUS | Status: DC

## 2014-07-27 MED ORDER — HYDROCHLOROTHIAZIDE 12.5 MG PO CAPS: 12.5000 mg | ORAL_CAPSULE | Freq: Every day | ORAL | Status: DC

## 2014-07-27 NOTE — Assessment & Plan Note (Signed)
BP Readings from Last 3 Encounters:  07/27/14 148/88  07/22/14 180/96  07/15/14 158/89    Lab Results  Component Value Date   NA 138 06/16/2014   K 4.5 06/16/2014   CREATININE 1.45* 06/16/2014    Assessment: Blood pressure control: mildly elevated Progress toward BP goal:   unchanged Comments: Patient states that he developed swelling of his amputation stump when he took amlodipine, which was prescribed recently. He therefore stopped taking it.  Plan: Medications:  Lisinopril 40 mg daily. I will add HCTZ 12.5 mg daily today. Educational resources provided: brochure (denies) Self management tools provided:   Other plans: Follow-up in 2-4 weeks for BP check and BMP

## 2014-07-27 NOTE — Assessment & Plan Note (Addendum)
Symptoms well controlled on a small dose of Prozac of 10 mg daily. Will continue.

## 2014-07-27 NOTE — Progress Notes (Signed)
Patient ID: [name], male   DOB: Sep 13, 1965, 49 y.o.   MRN: [id number]   Subjective:   HPI: [name] is a 49 y.o.  gentleman with extensive past medical history as listed below presents for routine follow-up visit. No new complaints today.  Kindly see the A&P for the status of the pt's chronic medical problems.   Past Medical History  Diagnosis Date  . Onychomycosis   . Hypertension   . Diabetic peripheral neuropathy   . Diabetic foot ulcer     s/p Right first ray amputation, left transmetatarsal amputation with revision  . History of drug abuse     Cocaine and marijuana  . Exposure to trichomonas     treated empirically  . Cellulitis of left foot 03/2008    left 4th and 5th metatarsal area  . Hyperlipidemia   . Alcoholic pancreatitis 67/5916  . Ulcerative esophagitis 3846    severe, complicated with UGI bleed  . Mallory [name] tear April 2009  . History of prolonged Q-T interval on ECG   . 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  . 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 CKD (chronic kidney disease) stage 2, GFR 60-89 ml/min     BL SCr 1.3-1.4  . Depression   . Peripheral vascular disease   . Type 2 diabetes mellitus, uncontrolled, with renal complications   . GERD (gastroesophageal reflux disease)   . 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 Recurrent Foot Osteomyelitis 02/19/2012    Recurrent osteomyelitis of toes with multiple ray amputations Follows up with Dr [name]   . 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 Cellulitis and abscess of leg 01/31/2014  . Osteomyelitis of left leg 01/21/2014  . History of left below knee amputation 01/21/2014    Performed on    . Status post below knee amputation of right lower extremity 03/23/2014    ROS: Constitutional:  Denies fever, chills, diaphoresis, appetite change and fatigue.  Respiratory: Denies SOB, DOE, cough, chest tightness, and wheezing. Denies chest pain. CVS: No chest pain, palpitations and leg swelling.  GI: No abdominal pain, nausea, vomiting, bloody stools GU: No dysuria, frequency, hematuria, or flank pain.  MSK: Has ongoing pain in his amputation stumps. He has tried Tylenol No. 2's in the past with some relief. Requests for refill. No myalgias, back pain, joint swelling, arthralgias  Psych: Depression symptoms well controlled with Prozac. No SI or SA.    Objective:  Physical Exam: Filed Vitals:   07/27/14 1018 07/27/14 1100  BP: 155/94 148/88  Pulse: 112 102  Temp: 98.1 F (36.7 C)   TempSrc: Oral   Height: 5' 9.5" (1.765 m)   Weight: 230 lb 12.8 oz (104.69 kg)   SpO2: 100%    General: Well nourished. No acute distress.  HEENT: Normal oral mucosa. MMM.  Lungs: CTA bilaterally. Heart: RRR; no extra sounds or murmurs  Abdomen: Non-distended, normal bowel sounds, soft, nontender; no hepatosplenomegaly  Extremities: bilateral BKA. Prosthetics in place. Has walking canes to assist with balance. No pedal edema. No joint swelling or tenderness. Neurologic: Normal EOM,  Alert and oriented x3. No obvious neurologic/cranial nerve deficits.  Assessment & Plan:  Discussed case with my attending in the clinic, Dr. Dareen Piano. See  problem based charting.

## 2014-07-27 NOTE — Patient Instructions (Signed)
General Instructions: Please stop taking Amlodipine 5 mg daily  Please start taking Hydrochlorthiazide 12.5 mg daily  Please switch you Lantus insulin to morning.  Please come back to see me in 2weeks to 1 month.  Thank you for bringing your medicines today. This helps [place] keep you safe from mistakes.   Progress Toward Treatment Goals:  Treatment Goal 05/26/2014  Hemoglobin A1C unchanged  Blood pressure unchanged  Stop smoking -    Self Care Goals & Plans:  Self Care Goal 07/27/2014  Manage my medications take my medicines as prescribed; bring my medications to every visit; refill my medications on time  Monitor my health keep track of my blood glucose; bring my glucose meter and log to each visit; keep track of my blood pressure; bring my blood pressure log to each visit  Eat healthy foods drink diet soda or water instead of juice or soda; eat more vegetables; eat foods that are low in salt; eat baked foods instead of fried foods; eat fruit for snacks and desserts  Be physically active -    Home Blood Glucose Monitoring 05/26/2014  Check my blood sugar 3 times a day  When to check my blood sugar before breakfast; before lunch; before dinner     Care Management & Community Referrals:  Referral 05/26/2014  Referrals made for care management support none needed  Referrals made to community resources -

## 2014-07-27 NOTE — Assessment & Plan Note (Signed)
Pain is still an issue. Reports relief with Tylenol #3. I will refill and follow up.

## 2014-07-27 NOTE — Assessment & Plan Note (Signed)
Lab Results  Component Value Date   HGBA1C 9.6 05/26/2014   HGBA1C 9.6 01/21/2014   HGBA1C 9.0 10/16/2013     Assessment: Diabetes control: poor control (HgbA1C >9%) Progress toward A1C goal:  unchanged Comments: Irregular timing of his meals. Patient is dependent on food stumps and his food intake vary widely throughout the month depending on when he gets access to food via food stamp assistance program. His CBG log reflects this variation. He tends to have one meal a day (one big dinner). FBG averaging 265, but his pre-dinner readings seem to be fairly controlled at an average of 100. However, his checking CBGs is erratic and infrequent, which makes assessment of his glycemic control quite challenging. Hypoglycemia is an issue in this patient at 11%. I have encouraged him to use his short acting insulin only before his largest meal of the day, which is dinner for him on most days. It may not be possible to use it three times a day due to lack of food.  Plan: Medications:  Discussed with the patient about timing of his meals. It seems we might do better by switching his Lantus from evening to morning since his FBGs a higher. I will not make any changes in his Lantus dose at this time. Continue with short acting insulin coverage for dinner only . Home glucose monitoring: Frequency: 3 times a day Timing: before breakfast, before lunch, before dinner Instruction/counseling given: reminded to bring blood glucose meter & log to each visit Educational resources provided: brochure (denies) Self management tools provided: copy of home glucose meter download, home glucose logbook Other plans: Follow-up within 2-4 weeks. I will review his blood sugar log and see if we need to make further changes in his insulin.

## 2014-07-27 NOTE — Assessment & Plan Note (Signed)
Prosthetics in place. Continues with outpatient PT rehabilitation for gait imbalance.

## 2014-07-28 NOTE — Progress Notes (Signed)
INTERNAL MEDICINE TEACHING ATTENDING ADDENDUM - Chinara Hertzberg, MD: I reviewed and discussed at the time of visit with the resident Dr. Kazibwe, the patient's medical history, physical examination, diagnosis and results of pertinent tests and treatment and I agree with the patient's care as documented.  

## 2014-07-29 ENCOUNTER — Ambulatory Visit: Payer: Medicaid Other | Admitting: Physical Therapy

## 2014-08-05 ENCOUNTER — Encounter: Payer: Self-pay | Admitting: Physical Therapy

## 2014-08-05 ENCOUNTER — Ambulatory Visit: Payer: Medicaid Other | Admitting: Physical Therapy

## 2014-08-05 VITALS — BP 150/96 | HR 109

## 2014-08-05 DIAGNOSIS — R2689 Other abnormalities of gait and mobility: Secondary | ICD-10-CM

## 2014-08-05 DIAGNOSIS — R269 Unspecified abnormalities of gait and mobility: Secondary | ICD-10-CM

## 2014-08-05 DIAGNOSIS — Z7409 Other reduced mobility: Secondary | ICD-10-CM

## 2014-08-06 NOTE — Therapy (Signed)
[facility] [address] [place], [place], [address] Phone: [phone]   Fax:  [phone]  Physical Therapy Treatment  Patient Details  Name: [name] MRN: [id number] Date of Birth: 20-Sep-1965 Referring Provider:  Newt Minion, MD  Encounter Date: 08/05/2014      PT End of Session - 08/05/14 1035    Visit Number 4   Number of Visits 9   Authorization Type Medicaid   Authorization Time Period 8 visits 06/24/14 - 10/13/14   Authorization - Visit Number 4   Authorization - Number of Visits 8   PT Start Time 1016   PT Stop Time 1101   PT Time Calculation (min) 45 min   Equipment Utilized During Treatment Gait belt   Activity Tolerance Patient tolerated treatment well   Behavior During Therapy [facility] for tasks assessed/performed      Past Medical History  Diagnosis Date  . Onychomycosis   . Hypertension   . Diabetic peripheral neuropathy   . Diabetic foot ulcer     s/p Right first ray amputation, left transmetatarsal amputation with revision  . History of drug abuse     Cocaine and marijuana  . Exposure to trichomonas     treated empirically  . Cellulitis of left foot 03/2008    left 4th and 5th metatarsal area  . Hyperlipidemia   . Alcoholic pancreatitis 11/6759  . Ulcerative esophagitis 9509    severe, complicated with UGI bleed  . Mallory [name] tear April 2009  . History of prolonged Q-T interval on ECG   . 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  . 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 CKD (chronic kidney disease) stage 2, GFR 60-89 ml/min     BL SCr 1.3-1.4  . Depression   . Peripheral vascular disease   . Type 2 diabetes mellitus, uncontrolled, with renal complications   . GERD (gastroesophageal reflux disease)   . Hx of right BKA 09/13/2012     Due to  severe wound infection with sepsis  . 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 Cellulitis and abscess of leg 01/31/2014  . Osteomyelitis of left leg 01/21/2014  . History of left below knee amputation 01/21/2014    Performed on    . Status post below knee amputation of right lower extremity 03/23/2014    Past Surgical History  Procedure Laterality Date  . Toe amputation  06/2011    left; 4th toe  . Nephrectomy      L side  . I&d extremity  08/24/2011    Procedure: IRRIGATION AND DEBRIDEMENT EXTREMITY;  Surgeon: Meredith Pel, MD;  Location: [place];  Service: Orthopedics;  Laterality: Left;  . Amputation  08/24/2011    Procedure: AMPUTATION RAY;  Surgeon: Meredith Pel, MD;  Location: [place];  Service: Orthopedics;  Laterality: Left;  left fourth toe Ray Resection  . Amputation  12/05/2011    Procedure: AMPUTATION RAY;  Surgeon: Meredith Pel, MD;  Location: [place];  Service: Orthopedics;  Laterality: Left;  . I&d extremity  02/18/2012    Procedure: IRRIGATION AND DEBRIDEMENT EXTREMITY;  Surgeon: Mcarthur Rossetti, MD;  Location:  [place] [facility];  Service: Orthopedics;  Laterality: Left;  . Amputation  02/18/2012    Procedure: AMPUTATION DIGIT;  Surgeon: Mcarthur Rossetti, MD;  Location: [place];  Service: Orthopedics;  Laterality: Left;  revison transmetatarsal  . I&d extremity Left 08/28/2012    Procedure: IRRIGATION AND DEBRIDEMENT EXTREMITY;  Surgeon: Linna Hoff, MD;  Location: [place];  Service: Orthopedics;  Laterality: Left;  CYSTO TUBING/IRRIGATION, LEAD HAND.  . I&d extremity Left 08/30/2012    Procedure: IRRIGATION AND DEBRIDEMENT EXTREMITY;  Surgeon: Linna Hoff, MD;  Location: [place];  Service: Orthopedics;  Laterality: Left;  . Amputation Right 09/01/2012    Procedure: FOOT 1ST RAY AMPUTATION;  Surgeon: Newt Minion, MD;  Location: [place];  Service: Orthopedics;  Laterality: Right;  . Amputation Left 09/01/2012    Procedure: FOOT REVISION TRANSMETATARSAL AMPUTATION ;  Surgeon: Newt Minion, MD;  Location: [place];  Service: Orthopedics;  Laterality: Left;  . Amputation Right 09/13/2012    Procedure: AMPUTATION BELOW KNEE;  Surgeon: Newt Minion, MD;  Location: [place];  Service: Orthopedics;  Laterality: Right;  Right Below Knee Amputation  . Amputation Left 12/12/2013    Procedure: Left Below Knee Amputation;  Surgeon: Newt Minion, MD;  Location: [place];  Service: Orthopedics;  Laterality: Left;  . Stump revision Left 01/23/2014    Procedure: STUMP REVISION;  Surgeon: Newt Minion, MD;  Location: [place];  Service: Orthopedics;  Laterality: Left;    Filed Vitals:   08/05/14 1024 08/05/14 1032  BP: 147/106 150/96  Pulse: 109     Visit Diagnosis:  Abnormality of gait  Balance problems  Impaired functional mobility and activity tolerance      Subjective Assessment - 08/05/14 1024    Subjective No new compliants. No falls or pain to report. Sugar is 331 (checked as walked in door as he did not check it before coming).   Currently in Pain? No/denies   Pain Score 0-No pain          [facility] Adult PT Treatment/Exercise - 08/05/14 1036    Ambulation/Gait   Ambulation/Gait Yes   Ambulation/Gait Assistance 6: Modified independent (Device/Increase time)   Ambulation/Gait Assistance Details no balance issues noted.   Ambulation Distance (Feet) 450 Feet   Assistive device Rolling walker;Prostheses   Gait Pattern Step-through pattern;Decreased stride length;Trunk flexed   Ambulation Surface Level;Indoor   Stairs Yes   Stairs Assistance 6: Modified independent (Device/Increase time)   Stair Management Technique Two rails;Step to pattern;Forwards   Number of Stairs 4   Ramp 5: Supervision  with walker/prostheses   Curb 5: Supervision  with walker/prostheses   Prosthetics   Current prosthetic wear  tolerance (days/week)  daily   Current prosthetic wear tolerance (#hours/day)  ~90% of awake hours   Residual limb condition  intact both legs   Donning Prosthesis Supervision   Doffing Prosthesis Modified independent (device/increased time)     Standing balance: Pt able to stand for 1 minute with supervision and no UE support on walker. Pt able to reach forward 4.5 inches without UE support with supervision.        PT Short Term Goals - 08/05/14 1035    PT SHORT TERM GOAL #1   Title wears right prosthesis >90% of awake hours & left prosthesis >75% of awake hours with no skin issues & pain <4/10 ((Target Date: 4th visit after eval)   Baseline 4/27- pt inconsistent with how long he wears his prostheses,  vaires depending on his pain/fatigue level's   Status Partially Met   PT SHORT TERM GOAL #2   Title verballized proper prosthetic care except cues on adjusting ply socks. ((Target Date: 4th visit after eval)   Baseline 4/27- Independent with care, needing cues on sock management only.   Status Achieved   PT SHORT TERM GOAL #3   Title ambulates 400' with rolling walker & prostheses modified independent. ((Target Date: 4th visit after eval)   Baseline on 08/06/14   Status Achieved   PT SHORT TERM GOAL #4   Title negotiates ramp & curb with rolling walker and stairs with 2 rails with bilateral prostheses with supervision. ((Target Date: 4th visit after eval)   Baseline on 08/05/14   Status Achieved   PT SHORT TERM GOAL #5   Title stands without UE support 1 minute & reaches 4" with supervision. ((Target Date: 4th visit after eval)   Baseline on 08/06/14   Status Achieved           PT Long Term Goals - 06/19/14 0930    PT LONG TERM GOAL #1   Title tolerates wear of bilateral prostheses >90% of awake hours without skin issues & pain </= 2/10 (Target Date: 8th visit after eval)   Baseline He has progressed wear of bilateral prostheses to 8 hours straight but pain up to 8/10 and skin  is too wet placing at risk of breakdown.   Status New   PT LONG TERM GOAL #2   Title independent with prosthetic care. (Target Date: 8th visit after eval)   Baseline Patient is dependent in prosthetic care.   Status New   PT LONG TERM GOAL #3   Title ambulates 500' with LRAD & prostheses modified independent. (Target Date: 8th visit after eval)   Baseline Patient ambulates 100' with rolling walker & prostheses with supervision / cues for safety.   Status New   PT LONG TERM GOAL #4   Title negotiates ramp, curb & stairs with LRAD & prostheses modified independent. (Target Date: 8th visit after eval)   Baseline Patient is non-ambulatory with bilateral prostheses on barriers.   Status New   PT LONG TERM GOAL #5   Title Berg Balance >36/56 (Target Date: 8th visit after eval)   Baseline Berg Balance 9/56   Status New           Plan - 08/05/14 1035    Clinical Impression Statement Pt has met all STG's and is progressing toward LTGs.    Pt will benefit from skilled therapeutic intervention in order to improve on the following deficits Abnormal gait;Decreased activity tolerance;Decreased balance;Decreased knowledge of use of DME;Decreased mobility;Decreased range of motion;Other (comment)  prosthetic dependency   Rehab Potential Good   PT Frequency 1x / week   PT Duration 8 weeks   PT Treatment/Interventions ADLs/Self Care Home Management;DME Instruction;Gait training;Stair training;Functional mobility training;Therapeutic activities;Therapeutic exercise;Balance training;Neuromuscular re-education;Patient/family education;Other (comment)  prosthetic training   PT Next Visit Plan continue with walking sticks,  monitor BP   PT Home Exercise Plan mid-line HEP, seated 4 point sequencing    Consulted and Agree with Plan of Care Patient;Family member/caregiver   Family Member Consulted wife      Problem List Patient Active Problem List   Diagnosis Date Noted  . Phantom limb pain  06/17/2014  . CN (constipation) 05/26/2014  . Erectile dysfunction 05/26/2014  . Status post below knee amputation of right lower extremity 03/23/2014  . Anemia, iron deficiency 02/04/2014  .  History of left below knee amputation 01/21/2014  . Below knee amputation status 12/12/2013  . S/P Right BKA  10/09/2012  . Hyperlipedemia 06/24/2012  . GERD (gastroesophageal reflux disease) 03/13/2012  . Depression 09/05/2011  . Marginal Housing 03/28/2011  . Essential hypertension 09/22/2008  . DIABETIC PERIPHERAL NEUROPATHY 07/21/2008  . CKD Stage 2 07/21/2008  . TOBACCO ABUSE 01/03/2006  . DRUG ABUSE 01/03/2006  . Diabetes mellitus type 2, uncontrolled, with complications 06/22/9456    Willow Ora 08/06/2014, 2:55 PM  Willow Ora, PTA, [facility] [address] [place], [place] [address] [phone] 08/06/2014, 2:55 PM

## 2014-08-10 ENCOUNTER — Ambulatory Visit: Payer: Medicaid Other | Admitting: Internal Medicine

## 2014-08-12 ENCOUNTER — Ambulatory Visit: Payer: Medicaid Other | Admitting: Physical Therapy

## 2014-08-19 ENCOUNTER — Ambulatory Visit: Payer: Medicaid Other | Attending: Orthopedic Surgery | Admitting: Physical Therapy

## 2014-08-19 DIAGNOSIS — R29818 Other symptoms and signs involving the nervous system: Secondary | ICD-10-CM | POA: Insufficient documentation

## 2014-08-19 DIAGNOSIS — Z7409 Other reduced mobility: Secondary | ICD-10-CM | POA: Insufficient documentation

## 2014-08-19 DIAGNOSIS — Z89512 Acquired absence of left leg below knee: Secondary | ICD-10-CM | POA: Insufficient documentation

## 2014-08-19 DIAGNOSIS — Z89511 Acquired absence of right leg below knee: Secondary | ICD-10-CM | POA: Insufficient documentation

## 2014-08-19 DIAGNOSIS — M24661 Ankylosis, right knee: Secondary | ICD-10-CM | POA: Insufficient documentation

## 2014-08-19 DIAGNOSIS — R269 Unspecified abnormalities of gait and mobility: Secondary | ICD-10-CM | POA: Insufficient documentation

## 2014-08-24 ENCOUNTER — Encounter: Payer: Self-pay | Admitting: *Deleted

## 2014-08-24 ENCOUNTER — Other Ambulatory Visit: Payer: Self-pay | Admitting: *Deleted

## 2014-08-24 DIAGNOSIS — N529 Male erectile dysfunction, unspecified: Secondary | ICD-10-CM

## 2014-08-24 DIAGNOSIS — I10 Essential (primary) hypertension: Secondary | ICD-10-CM

## 2014-08-25 MED ORDER — SILDENAFIL CITRATE 20 MG PO TABS: ORAL_TABLET | ORAL | Status: DC

## 2014-08-25 MED ORDER — HYDROCHLOROTHIAZIDE 12.5 MG PO CAPS: 12.5000 mg | ORAL_CAPSULE | Freq: Every day | ORAL | Status: DC

## 2014-08-26 ENCOUNTER — Ambulatory Visit: Payer: Medicaid Other | Admitting: Physical Therapy

## 2014-08-26 ENCOUNTER — Encounter: Payer: Self-pay | Admitting: Physical Therapy

## 2014-08-26 VITALS — BP 113/87 | HR 116

## 2014-08-26 DIAGNOSIS — Z7409 Other reduced mobility: Secondary | ICD-10-CM

## 2014-08-26 DIAGNOSIS — Z89512 Acquired absence of left leg below knee: Secondary | ICD-10-CM | POA: Diagnosis not present

## 2014-08-26 DIAGNOSIS — Z89511 Acquired absence of right leg below knee: Secondary | ICD-10-CM | POA: Diagnosis not present

## 2014-08-26 DIAGNOSIS — R269 Unspecified abnormalities of gait and mobility: Secondary | ICD-10-CM

## 2014-08-26 DIAGNOSIS — M24661 Ankylosis, right knee: Secondary | ICD-10-CM

## 2014-08-26 DIAGNOSIS — R29818 Other symptoms and signs involving the nervous system: Secondary | ICD-10-CM | POA: Diagnosis not present

## 2014-08-26 DIAGNOSIS — R2689 Other abnormalities of gait and mobility: Secondary | ICD-10-CM

## 2014-08-26 NOTE — Therapy (Addendum)
[facility] [address] [place], [place], [address] Phone: [phone]   Fax:  [phone]  Physical Therapy Treatment  Patient Details  Name: [name] MRN: [id number] Date of Birth: 1965/07/05 Referring Provider:  Newt Minion, MD  Encounter Date: 08/26/2014      PT End of Session - 08/26/14 1015    Visit Number 5   Number of Visits 9   Authorization Type Medicaid   Authorization Time Period 8 visits 06/24/14 - 10/13/14   Authorization - Visit Number 5   Authorization - Number of Visits 8   PT Start Time 1020   PT Stop Time 1105   PT Time Calculation (min) 45 min   Equipment Utilized During Treatment Gait belt   Activity Tolerance Patient tolerated treatment well   Behavior During Therapy [facility] for tasks assessed/performed      Past Medical History  Diagnosis Date  . Onychomycosis   . Hypertension   . Diabetic peripheral neuropathy   . Diabetic foot ulcer     s/p Right first ray amputation, left transmetatarsal amputation with revision  . History of drug abuse     Cocaine and marijuana  . Exposure to trichomonas     treated empirically  . Cellulitis of left foot 03/2008    left 4th and 5th metatarsal area  . Hyperlipidemia   . 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 Ulcerative esophagitis 5170    severe, complicated with UGI bleed  . Mallory [name] tear April 2009  . History of prolonged Q-T interval on ECG   . 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  . 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 CKD (chronic kidney disease) stage 2, GFR 60-89 ml/min     BL SCr 1.3-1.4  . Depression   . Peripheral vascular disease   . Type 2 diabetes mellitus, uncontrolled, with renal complications   . GERD (gastroesophageal reflux disease)   . Hx of right BKA 09/13/2012     Due to  severe wound infection with sepsis  . 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 Cellulitis and abscess of leg 01/31/2014  . Osteomyelitis of left leg 01/21/2014  . History of left below knee amputation 01/21/2014    Performed on    . Status post below knee amputation of right lower extremity 03/23/2014    Past Surgical History  Procedure Laterality Date  . Toe amputation  06/2011    left; 4th toe  . Nephrectomy      L side  . I&d extremity  08/24/2011    Procedure: IRRIGATION AND DEBRIDEMENT EXTREMITY;  Surgeon: Meredith Pel, MD;  Location: [place];  Service: Orthopedics;  Laterality: Left;  . Amputation  08/24/2011    Procedure: AMPUTATION RAY;  Surgeon: Meredith Pel, MD;  Location: [place];  Service: Orthopedics;  Laterality: Left;  left fourth toe Ray Resection  . Amputation  12/05/2011    Procedure: AMPUTATION RAY;  Surgeon: Meredith Pel, MD;  Location: [place];  Service: Orthopedics;  Laterality: Left;  . I&d extremity  02/18/2012    Procedure: IRRIGATION AND DEBRIDEMENT EXTREMITY;  Surgeon: Mcarthur Rossetti, MD;  Location:  [place] [facility];  Service: Orthopedics;  Laterality: Left;  . Amputation  02/18/2012    Procedure: AMPUTATION DIGIT;  Surgeon: Mcarthur Rossetti, MD;  Location: [place];  Service: Orthopedics;  Laterality: Left;  revison transmetatarsal  . I&d extremity Left 08/28/2012    Procedure: IRRIGATION AND DEBRIDEMENT EXTREMITY;  Surgeon: Linna Hoff, MD;  Location: [place];  Service: Orthopedics;  Laterality: Left;  CYSTO TUBING/IRRIGATION, LEAD HAND.  . I&d extremity Left 08/30/2012    Procedure: IRRIGATION AND DEBRIDEMENT EXTREMITY;  Surgeon: Linna Hoff, MD;  Location: [place];  Service: Orthopedics;  Laterality: Left;  . Amputation Right 09/01/2012    Procedure: FOOT 1ST RAY AMPUTATION;  Surgeon: Newt Minion, MD;  Location: [place];  Service: Orthopedics;  Laterality: Right;  . Amputation Left 09/01/2012    Procedure: FOOT REVISION TRANSMETATARSAL AMPUTATION ;  Surgeon: Newt Minion, MD;  Location: [place];  Service: Orthopedics;  Laterality: Left;  . Amputation Right 09/13/2012    Procedure: AMPUTATION BELOW KNEE;  Surgeon: Newt Minion, MD;  Location: [place];  Service: Orthopedics;  Laterality: Right;  Right Below Knee Amputation  . Amputation Left 12/12/2013    Procedure: Left Below Knee Amputation;  Surgeon: Newt Minion, MD;  Location: [place];  Service: Orthopedics;  Laterality: Left;  . Stump revision Left 01/23/2014    Procedure: STUMP REVISION;  Surgeon: Newt Minion, MD;  Location: [place];  Service: Orthopedics;  Laterality: Left;    Filed Vitals:   08/26/14 1025  BP: 113/87  Pulse: 116    Visit Diagnosis:  Impaired functional mobility and activity tolerance  Balance problems  Abnormality of gait  Decreased range of motion of knee, right  Status post bilateral below knee amputation      Subjective Assessment - 08/26/14 1019    Subjective For last 2 weeks patient has not been wearing legs due to depressive episode and decreased motivation. Just started wearing them again the last 4 days, all awake hours. Sugar was 161 prior to breakfast. Patient reports his wife left him 2 weeks ago causing his depressed mood; a friend has moved into home with him to assist with care.   Currently in Pain? No/denies      Prosthetic Training: Sit to /from stand with  Supervision  from chair with armrests with object to stabilize Patient ambulated with Supervision Device: walking sticks & prostheses  Distance: 500' & 300' on Indoor  Level & outdoor gravel / grass/ paved surface.  Multi-modal cues for deviations: posture, step length, step width Patient negotiated ramp with Supervision Device: walking sticks & prostheses with cues on wt shift & technique Patient negotiated curb with Supervision  Device: walking sticks & prostheses with verbal cues & demo on momentum & technique Patient stepped over car bumper with PT demo / verbal cueing technique using side step method to visually observe clearance with supervision.  Therapeutic Exercise:  PT demo & verbal / tactile cues for [facility] hip flexion stretch, supine hamstring stretch, supine trunk rotation and sidelying quadriceps stretch using sheet tied around ankle. Patient return demo understanding. Standing in doorframe with overhead reaches to facilitate upright posture with stretch. Patient verbalized & return demo understanding.                           PT Education - 08/26/14 1015    Education provided Yes   Education Details stretches for hip flexors, hamstrings, trunk rotation  and quadriceps, wear during illness to decrease fall risk   Person(s) Educated Patient   Methods Explanation;Demonstration;Tactile cues;Verbal cues   Comprehension Verbalized understanding;Returned demonstration;Verbal cues required;Tactile cues required;Need further instruction          PT Short Term Goals - 08/26/14 1015    PT SHORT TERM GOAL #1   Title wears right prosthesis >90% of awake hours & left prosthesis >75% of awake hours with no skin issues & pain <4/10 ((Target Date: 4th visit after eval)   Baseline 4/27- pt inconsistent with how long he wears his prostheses, vaires depending on his pain/fatigue level's   Status Partially Met   PT SHORT TERM GOAL #2   Title verballized proper prosthetic care except cues on adjusting ply socks. ((Target Date: 4th visit after eval)   Baseline 4/27- Independent with care, needing cues on sock management only.   Status Achieved   PT SHORT TERM GOAL #3   Title ambulates 400' with rolling walker & prostheses modified independent. ((Target Date: 4th visit after eval)   Baseline on 08/06/14   Status Achieved   PT SHORT TERM GOAL #4   Title negotiates ramp & curb with rolling walker and  stairs with 2 rails with bilateral prostheses with supervision. ((Target Date: 4th visit after eval)   Baseline on 08/05/14   Status Achieved   PT SHORT TERM GOAL #5   Title stands without UE support 1 minute & reaches 4" with supervision. ((Target Date: 4th visit after eval)   Baseline on 08/06/14   Status Achieved           PT Long Term Goals - 06/19/14 0930    PT LONG TERM GOAL #1   Title tolerates wear of bilateral prostheses >90% of awake hours without skin issues & pain </= 2/10 (Target Date: 8th visit after eval)   Baseline He has progressed wear of bilateral prostheses to 8 hours straight but pain up to 8/10 and skin is too wet placing at risk of breakdown.   Status New   PT LONG TERM GOAL #2   Title independent with prosthetic care. (Target Date: 8th visit after eval)   Baseline Patient is dependent in prosthetic care.   Status New   PT LONG TERM GOAL #3   Title ambulates 500' with LRAD & prostheses modified independent. (Target Date: 8th visit after eval)   Baseline Patient ambulates 100' with rolling walker & prostheses with supervision / cues for safety.   Status New   PT LONG TERM GOAL #4   Title negotiates ramp, curb & stairs with LRAD & prostheses modified independent. (Target Date: 8th visit after eval)   Baseline Patient is non-ambulatory with bilateral prostheses on barriers.   Status New   PT LONG TERM GOAL #5   Title Berg Balance >36/56 (Target Date: 8th visit after eval)   Baseline Berg Balance 9/56   Status New               Plan - 08/26/14 1015    Clinical Impression Statement patient improved ability to turn around obstacles and negotiate curbs / obstacles with instruction & repetition. Patient appears to understand stretching HEP.   Pt will benefit from skilled therapeutic intervention in order to improve on the following deficits Abnormal gait;Decreased activity tolerance;Decreased balance;Decreased knowledge of use of DME;Decreased  mobility;Decreased range of motion;Other (comment)  prosthetic dependency   Rehab Potential Good   PT Frequency 1x / week   PT Duration 8 weeks  PT Treatment/Interventions ADLs/Self Care Home Management;DME Instruction;Gait training;Stair training;Functional mobility training;Therapeutic activities;Therapeutic exercise;Balance training;Neuromuscular re-education;Patient/family education;Other (comment)  prosthetic training   PT Next Visit Plan continue with walking sticks,  monitor BP   Consulted and Agree with Plan of Care Patient        Problem List Patient Active Problem List   Diagnosis Date Noted  . Phantom limb pain 06/17/2014  . CN (constipation) 05/26/2014  . Erectile dysfunction 05/26/2014  . Status post below knee amputation of right lower extremity 03/23/2014  . Anemia, iron deficiency 02/04/2014  . History of left below knee amputation 01/21/2014  . Below knee amputation status 12/12/2013  . S/P Right BKA  10/09/2012  . Hyperlipedemia 06/24/2012  . GERD (gastroesophageal reflux disease) 03/13/2012  . Depression 09/05/2011  . Marginal Housing 03/28/2011  . Essential hypertension 09/22/2008  . DIABETIC PERIPHERAL NEUROPATHY 07/21/2008  . CKD Stage 2 07/21/2008  . TOBACCO ABUSE 01/03/2006  . DRUG ABUSE 01/03/2006  . Diabetes mellitus type 2, uncontrolled, with complications 93/26/7124    Jamey Reas PT, DPT 08/26/2014, 1:39 PM  [facility] [address] [place], [place], [address] Phone: [phone]   Fax:  [phone]

## 2014-08-27 ENCOUNTER — Encounter: Payer: Self-pay | Admitting: Licensed Clinical Social Worker

## 2014-08-27 NOTE — Progress Notes (Signed)
Mrs. [name] presents as walk-in today requesting letter from [facility] stating she assists with pt's ADL's.  Spouse states pt has been approved for disability and SSA request letter.  CSW provided spouse with letter.

## 2014-09-01 ENCOUNTER — Ambulatory Visit: Payer: Medicaid Other | Admitting: Physical Therapy

## 2014-09-13 ENCOUNTER — Emergency Department (HOSPITAL_COMMUNITY): Payer: Medicaid Other

## 2014-09-13 ENCOUNTER — Inpatient Hospital Stay (HOSPITAL_COMMUNITY)
Admission: EM | Admit: 2014-09-13 | Discharge: 2014-09-16 | DRG: 638 | Disposition: A | Discharge status: Home / Self Care | Payer: Medicaid Other | Attending: Internal Medicine | Admitting: Internal Medicine

## 2014-09-13 ENCOUNTER — Encounter (HOSPITAL_COMMUNITY): Payer: Self-pay

## 2014-09-13 DIAGNOSIS — Z905 Acquired absence of kidney: Secondary | ICD-10-CM | POA: Diagnosis present

## 2014-09-13 DIAGNOSIS — F17210 Nicotine dependence, cigarettes, uncomplicated: Secondary | ICD-10-CM | POA: Diagnosis present

## 2014-09-13 DIAGNOSIS — Z89022 Acquired absence of left finger(s): Secondary | ICD-10-CM

## 2014-09-13 DIAGNOSIS — Z794 Long term (current) use of insulin: Secondary | ICD-10-CM

## 2014-09-13 DIAGNOSIS — Z89512 Acquired absence of left leg below knee: Secondary | ICD-10-CM

## 2014-09-13 DIAGNOSIS — I129 Hypertensive chronic kidney disease with stage 1 through stage 4 chronic kidney disease, or unspecified chronic kidney disease: Secondary | ICD-10-CM | POA: Diagnosis not present

## 2014-09-13 DIAGNOSIS — Z89511 Acquired absence of right leg below knee: Secondary | ICD-10-CM

## 2014-09-13 DIAGNOSIS — N182 Chronic kidney disease, stage 2 (mild): Secondary | ICD-10-CM | POA: Diagnosis not present

## 2014-09-13 DIAGNOSIS — N119 Chronic tubulo-interstitial nephritis, unspecified: Secondary | ICD-10-CM | POA: Diagnosis present

## 2014-09-13 DIAGNOSIS — K449 Diaphragmatic hernia without obstruction or gangrene: Secondary | ICD-10-CM | POA: Diagnosis present

## 2014-09-13 DIAGNOSIS — I739 Peripheral vascular disease, unspecified: Secondary | ICD-10-CM | POA: Diagnosis present

## 2014-09-13 DIAGNOSIS — R112 Nausea with vomiting, unspecified: Secondary | ICD-10-CM

## 2014-09-13 DIAGNOSIS — Z9114 Patient's other noncompliance with medication regimen: Secondary | ICD-10-CM | POA: Diagnosis present

## 2014-09-13 DIAGNOSIS — N179 Acute kidney failure, unspecified: Secondary | ICD-10-CM | POA: Diagnosis present

## 2014-09-13 DIAGNOSIS — E1142 Type 2 diabetes mellitus with diabetic polyneuropathy: Secondary | ICD-10-CM | POA: Diagnosis present

## 2014-09-13 DIAGNOSIS — IMO0002 Reserved for concepts with insufficient information to code with codable children: Secondary | ICD-10-CM

## 2014-09-13 DIAGNOSIS — K208 Other esophagitis: Secondary | ICD-10-CM | POA: Diagnosis present

## 2014-09-13 DIAGNOSIS — E1122 Type 2 diabetes mellitus with diabetic chronic kidney disease: Secondary | ICD-10-CM | POA: Diagnosis not present

## 2014-09-13 DIAGNOSIS — K210 Gastro-esophageal reflux disease with esophagitis: Secondary | ICD-10-CM | POA: Diagnosis present

## 2014-09-13 DIAGNOSIS — K920 Hematemesis: Secondary | ICD-10-CM | POA: Diagnosis present

## 2014-09-13 DIAGNOSIS — E871 Hypo-osmolality and hyponatremia: Secondary | ICD-10-CM | POA: Diagnosis present

## 2014-09-13 DIAGNOSIS — E875 Hyperkalemia: Secondary | ICD-10-CM | POA: Diagnosis present

## 2014-09-13 DIAGNOSIS — E118 Type 2 diabetes mellitus with unspecified complications: Secondary | ICD-10-CM

## 2014-09-13 DIAGNOSIS — F329 Major depressive disorder, single episode, unspecified: Secondary | ICD-10-CM | POA: Diagnosis present

## 2014-09-13 DIAGNOSIS — E785 Hyperlipidemia, unspecified: Secondary | ICD-10-CM | POA: Diagnosis present

## 2014-09-13 DIAGNOSIS — E1165 Type 2 diabetes mellitus with hyperglycemia: Secondary | ICD-10-CM

## 2014-09-13 DIAGNOSIS — E1100 Type 2 diabetes mellitus with hyperosmolarity without nonketotic hyperglycemic-hyperosmolar coma (NKHHC): Principal | ICD-10-CM | POA: Diagnosis present

## 2014-09-13 DIAGNOSIS — I7389 Other specified peripheral vascular diseases: Secondary | ICD-10-CM | POA: Diagnosis present

## 2014-09-13 DIAGNOSIS — D649 Anemia, unspecified: Secondary | ICD-10-CM | POA: Diagnosis present

## 2014-09-13 DIAGNOSIS — Z89021 Acquired absence of right finger(s): Secondary | ICD-10-CM

## 2014-09-13 DIAGNOSIS — G546 Phantom limb syndrome with pain: Secondary | ICD-10-CM | POA: Diagnosis present

## 2014-09-13 DIAGNOSIS — Z8711 Personal history of peptic ulcer disease: Secondary | ICD-10-CM

## 2014-09-13 LAB — CBG MONITORING, ED
Glucose-Capillary: >600 mg/dL (ref 65–99)
Glucose-Capillary: >600 mg/dL (ref 65–99)
Glucose-Capillary: >600 mg/dL (ref 65–99)
Glucose-Capillary: >600 mg/dL (ref 65–99)

## 2014-09-13 LAB — I-STAT CHEM 8, ED
BUN: 61 mg/dL — ABNORMAL HIGH (ref 6–20)
CHLORIDE: 82 mmol/L — AB (ref 101–111)
CREATININE: 2.3 mg/dL — AB (ref 0.61–1.24)
Calcium, Ion: 1.09 mmol/L — ABNORMAL LOW (ref 1.12–1.23)
Glucose, Bld: >700 mg/dL (ref 65–99)
HEMATOCRIT: 39 % (ref 39.0–52.0)
Hemoglobin: 13.3 g/dL (ref 13.0–17.0)
POTASSIUM: 6.3 mmol/L — AB (ref 3.5–5.1)
Sodium: 113 mmol/L — CL (ref 135–145)
TCO2: 20 mmol/L (ref 0–100)

## 2014-09-13 LAB — URINALYSIS, ROUTINE W REFLEX MICROSCOPIC
BILIRUBIN URINE: NEGATIVE
Hgb urine dipstick: NEGATIVE
KETONES UR: NEGATIVE mg/dL
Leukocytes, UA: NEGATIVE
NITRITE: NEGATIVE
Protein, ur: NEGATIVE mg/dL
Specific Gravity, Urine: 1.027 (ref 1.005–1.030)
Urobilinogen, UA: 0.2 mg/dL (ref 0.0–1.0)
pH: 5.5 (ref 5.0–8.0)

## 2014-09-13 LAB — CBC WITH DIFFERENTIAL/PLATELET
Basophils Absolute: 0 10*3/uL (ref 0.0–0.1)
Basophils Relative: 0 % (ref 0–1)
Eosinophils Absolute: 0 10*3/uL (ref 0.0–0.7)
Eosinophils Relative: 0 % (ref 0–5)
HEMATOCRIT: 35.6 % — AB (ref 39.0–52.0)
Hemoglobin: 11 g/dL — ABNORMAL LOW (ref 13.0–17.0)
LYMPHS ABS: 1 10*3/uL (ref 0.7–4.0)
LYMPHS PCT: 6 % — AB (ref 12–46)
MCH: 29.2 pg (ref 26.0–34.0)
MCHC: 30.9 g/dL (ref 30.0–36.0)
MCV: 94.4 fL (ref 78.0–100.0)
MONOS PCT: 4 % (ref 3–12)
Monocytes Absolute: 0.7 10*3/uL (ref 0.1–1.0)
NEUTROS PCT: 90 % — AB (ref 43–77)
Neutro Abs: 16.9 10*3/uL — ABNORMAL HIGH (ref 1.7–7.7)
Platelets: 467 10*3/uL — ABNORMAL HIGH (ref 150–400)
RBC: 3.77 MIL/uL — ABNORMAL LOW (ref 4.22–5.81)
RDW: 13.2 % (ref 11.5–15.5)
WBC: 18.7 10*3/uL — ABNORMAL HIGH (ref 4.0–10.5)

## 2014-09-13 LAB — I-STAT VENOUS BLOOD GAS, ED
Acid-base deficit: 2 mmol/L (ref 0.0–2.0)
Bicarbonate: 22.5 mEq/L (ref 20.0–24.0)
O2 SAT: 97 %
PH VEN: 7.381 — AB (ref 7.250–7.300)
TCO2: 24 mmol/L (ref 0–100)
pCO2, Ven: 37.9 mmHg — ABNORMAL LOW (ref 45.0–50.0)
pO2, Ven: 89 mmHg — ABNORMAL HIGH (ref 30.0–45.0)

## 2014-09-13 LAB — BASIC METABOLIC PANEL
Anion gap: 15 (ref 5–15)
Anion gap: 13 (ref 5–15)
BUN: 61 mg/dL — AB (ref 6–20)
BUN: 65 mg/dL — ABNORMAL HIGH (ref 6–20)
CO2: 20 mmol/L — ABNORMAL LOW (ref 22–32)
CO2: 22 mmol/L (ref 22–32)
Calcium: 8.6 mg/dL — ABNORMAL LOW (ref 8.9–10.3)
Calcium: 8.7 mg/dL — ABNORMAL LOW (ref 8.9–10.3)
Chloride: 78 mmol/L — ABNORMAL LOW (ref 101–111)
Chloride: 85 mmol/L — ABNORMAL LOW (ref 101–111)
Creatinine, Ser: 2.57 mg/dL — ABNORMAL HIGH (ref 0.61–1.24)
Creatinine, Ser: 2.44 mg/dL — ABNORMAL HIGH (ref 0.61–1.24)
GFR calc Af Amer: 32 mL/min — ABNORMAL LOW (ref >60)
GFR calc Af Amer: 34 mL/min — ABNORMAL LOW (ref >60)
GFR calc non Af Amer: 28 mL/min — ABNORMAL LOW (ref >60)
GFR calc non Af Amer: 29 mL/min — ABNORMAL LOW (ref >60)
GLUCOSE: 1596 mg/dL — AB (ref 65–99)
GLUCOSE: 1056 mg/dL — AB (ref 65–99)
POTASSIUM: 6.2 mmol/L — AB (ref 3.5–5.1)
POTASSIUM: 4.9 mmol/L (ref 3.5–5.1)
SODIUM: 120 mmol/L — AB (ref 135–145)
Sodium: 113 mmol/L — CL (ref 135–145)

## 2014-09-13 LAB — URINE MICROSCOPIC-ADD ON

## 2014-09-13 MED ORDER — SODIUM CHLORIDE 0.9 % IV SOLN
1000.0000 mL | INTRAVENOUS | Status: DC
  Administered 2014-09-13 – 2014-09-15 (×2): 1000 mL via INTRAVENOUS

## 2014-09-13 MED ORDER — SODIUM CHLORIDE 0.9 % IV SOLN
1.0000 g | Freq: Once | INTRAVENOUS | Status: AC
  Administered 2014-09-13: 1 g via INTRAVENOUS
  Filled 2014-09-13: qty 10

## 2014-09-13 MED ORDER — SODIUM CHLORIDE 0.9 % IV SOLN
1000.0000 mL | Freq: Once | INTRAVENOUS | Status: AC
  Administered 2014-09-13: 1000 mL via INTRAVENOUS

## 2014-09-13 MED ORDER — DEXTROSE-NACL 5-0.45 % IV SOLN: INTRAVENOUS | Status: DC

## 2014-09-13 MED ORDER — INSULIN REGULAR HUMAN 100 UNIT/ML IJ SOLN
INTRAMUSCULAR | Status: DC
  Administered 2014-09-13: 5.4 [IU]/h via INTRAVENOUS
  Filled 2014-09-13: qty 2.5

## 2014-09-13 MED ORDER — ONDANSETRON HCL 4 MG/2ML IJ SOLN
4.0000 mg | Freq: Once | INTRAMUSCULAR | Status: AC
  Administered 2014-09-13: 4 mg via INTRAVENOUS
  Filled 2014-09-13: qty 2

## 2014-09-13 NOTE — ED Provider Notes (Signed)
CSN: [id number]     Arrival date & time 09/13/14  1831 History   First MD Initiated Contact with Patient 09/13/14 1836     Chief Complaint  Patient presents with  . Hyperglycemia     (Consider location/radiation/quality/duration/timing/severity/associated sxs/prior Treatment) Patient is a 49 y.o. male presenting with vomiting.  Emesis Severity:  Moderate Duration:  10 hours Timing:  Intermittent Quality:  Stomach contents Progression:  Unchanged Chronicity:  Recurrent Context: not post-tussive and not self-induced   Relieved by:  None tried Worsened by:  Nothing tried Ineffective treatments:  None tried Associated symptoms: abdominal pain   Associated symptoms: no chills, no cough, no diarrhea, no fever, no headaches, no myalgias and no sore throat   Abdominal pain:    Location:  Generalized Risk factors: alcohol use and diabetes     Past Medical History  Diagnosis Date  . Onychomycosis   . Hypertension   . Diabetic peripheral neuropathy   . Diabetic foot ulcer     s/p Right first ray amputation, left transmetatarsal amputation with revision  . History of drug abuse     Cocaine and marijuana  . Exposure to trichomonas     treated empirically  . Cellulitis of left foot 03/2008    left 4th and 5th metatarsal area  . Hyperlipidemia   . 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 Mallory [name] tear April 2009  . History of prolonged Q-T interval on ECG   . 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  . 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 CKD (chronic kidney disease) stage 2, GFR 60-89 ml/min     BL SCr 1.3-1.4  . Depression   . Peripheral vascular disease   . Type 2 diabetes mellitus, uncontrolled, with renal complications   . GERD (gastroesophageal reflux disease)    . 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 Cellulitis and abscess of leg 01/31/2014  . Osteomyelitis of left leg 01/21/2014  . History of left below knee amputation 01/21/2014    Performed on    . Status post below knee amputation of right lower extremity 03/23/2014   Past Surgical History  Procedure Laterality Date  . Toe amputation  06/2011    left; 4th toe  . Nephrectomy      L side  . I&d extremity  08/24/2011    Procedure: IRRIGATION AND DEBRIDEMENT EXTREMITY;  Surgeon: Meredith Pel, MD;  Location: [place];  Service: Orthopedics;  Laterality: Left;  . Amputation  08/24/2011    Procedure: AMPUTATION RAY;  Surgeon: Meredith Pel, MD;  Location: [place];  Service: Orthopedics;  Laterality: Left;  left fourth toe Ray Resection  . Amputation  12/05/2011    Procedure: AMPUTATION RAY;  Surgeon: Meredith Pel, MD;  Location: [place];  Service: Orthopedics;  Laterality: Left;  . I&d extremity  02/18/2012    Procedure: IRRIGATION AND DEBRIDEMENT EXTREMITY;  Surgeon: Mcarthur Rossetti, MD;  Location: [place];  Service: Orthopedics;  Laterality: Left;  . Amputation  02/18/2012    Procedure: AMPUTATION DIGIT;  Surgeon: Mcarthur Rossetti, MD;  Location: [place];  Service: Orthopedics;  Laterality: Left;  revison transmetatarsal  . I&d extremity Left 08/28/2012    Procedure: IRRIGATION AND DEBRIDEMENT EXTREMITY;  Surgeon: Linna Hoff, MD;  Location: [place];  Service: Orthopedics;  Laterality: Left;  CYSTO TUBING/IRRIGATION, LEAD HAND.  . I&d extremity Left 08/30/2012    Procedure: IRRIGATION AND DEBRIDEMENT EXTREMITY;  Surgeon: Linna Hoff, MD;  Location: [place];  Service: Orthopedics;  Laterality: Left;  . Amputation Right 09/01/2012    Procedure: FOOT  1ST RAY AMPUTATION;  Surgeon: Newt Minion, MD;  Location: [place];  Service: Orthopedics;  Laterality: Right;  . Amputation Left 09/01/2012    Procedure: FOOT REVISION TRANSMETATARSAL AMPUTATION ;  Surgeon: Newt Minion, MD;  Location: [place];  Service: Orthopedics;  Laterality: Left;  . Amputation Right 09/13/2012    Procedure: AMPUTATION BELOW KNEE;  Surgeon: Newt Minion, MD;  Location: [place];  Service: Orthopedics;  Laterality: Right;  Right Below Knee Amputation  . Amputation Left 12/12/2013    Procedure: Left Below Knee Amputation;  Surgeon: Newt Minion, MD;  Location: [place];  Service: Orthopedics;  Laterality: Left;  . Stump revision Left 01/23/2014    Procedure: STUMP REVISION;  Surgeon: Newt Minion, MD;  Location: [place];  Service: Orthopedics;  Laterality: Left;   Family History  Problem Relation Age of Onset  . Diabetes type II Mother   . Pancreatic cancer Father    History  Substance Use Topics  . Smoking status: Current Every Day Smoker -- 0.50 packs/day for 34 years    Types: Cigarettes  . Smokeless tobacco: Never Used     Comment: 1/2 PPD  . Alcohol Use: 0.0 oz/week    0 Standard drinks or equivalent per week     Comment: occ    Review of Systems  Constitutional: Negative for fever, chills, appetite change and fatigue.  HENT: Negative for congestion, ear pain, facial swelling, mouth sores and sore throat.   Eyes: Negative for visual disturbance.  Respiratory: Negative for cough, chest tightness and shortness of breath.   Cardiovascular: Positive for chest pain. Negative for palpitations.  Gastrointestinal: Positive for vomiting and abdominal pain. Negative for nausea, diarrhea and blood in stool.  Endocrine: Negative for cold intolerance and heat intolerance.  Genitourinary: Negative for frequency, decreased urine volume and difficulty urinating.  Musculoskeletal: Negative for myalgias, back pain and neck stiffness.  Skin: Negative for rash.  Neurological: Negative  for dizziness, weakness, light-headedness and headaches.  All other systems reviewed and are negative.     Allergies  Review of patient's allergies indicates no known allergies.  Home Medications   Prior to Admission medications   Medication Sig Start Date End Date Taking? Authorizing Provider  acetaminophen-codeine (TYLENOL #3) 300-30 MG per tablet Take 1-2 tablets by mouth every 4 (four) hours as needed for moderate pain. 07/27/14  Yes [name], MD  gabapentin (NEURONTIN) 600 MG tablet Take 1 tablet (600 mg total) by mouth 3 (three) times daily. 06/04/14  Yes [name], MD  hydrochlorothiazide (MICROZIDE) 12.5 MG capsule Take 1 capsule (12.5 mg total) by mouth daily. 08/25/14  Yes [name], MD  insulin aspart (NOVOLOG) 100 UNIT/ML injection Inject 1-10 Units into the skin 3 (three) times daily before meals.    Yes Historical Provider, MD  insulin glargine (LANTUS) 100 UNIT/ML injection Inject 0.44 mLs (44 Units total) into the  skin every morning. 07/27/14  Yes [name], MD  lisinopril (PRINIVIL,ZESTRIL) 40 MG tablet Take 0.5 tablets (20 mg total) by mouth daily. 05/26/14  Yes [name], MD  pantoprazole (PROTONIX) 40 MG tablet Take 40 mg by mouth daily as needed (for acid reflux).   Yes Historical Provider, MD  polyethylene glycol (MIRALAX / GLYCOLAX) packet Take 17 g by mouth daily. 05/26/14  Yes [name], MD  sildenafil (REVATIO) 20 MG tablet Take 2-5 tablets before sexual activity. 08/25/14  Yes [name], MD  ACCU-CHEK FASTCLIX LANCETS MISC 1 each by Does not apply route 4 (four) times daily -  before meals and at bedtime. 03/23/14   Alexa Sherral Hammers, MD  ferrous sulfate 325 (65 FE) MG tablet Take 1 tablet (325 mg total) by mouth 3 (three) times daily with meals. 02/09/14   Corky Sox, MD  glucose blood (ACCU-CHEK SMARTVIEW) test strip 1 each by Other route 4 (four) times daily -  before meals and at bedtime. Use as instructed 07/09/14   [name], MD  nicotine (NICODERM CQ - DOSED IN MG/24 HOURS) 14 mg/24hr patch Place 1 patch onto the skin daily. 03/14/14   [name], MD   BP 156/84 mmHg  Pulse 110  Temp(Src) 98 F (36.7 C) (Oral)  Resp 24  Ht 5\' 9"  (1.753 m)  Wt 210 lb (95.255 kg)  BMI 31.00 kg/m2  SpO2 100% Physical Exam  Constitutional: He is oriented to person, place, and time. He appears well-nourished. No distress.  HENT:  Head: Normocephalic and atraumatic.  Right Ear: External ear normal.  Left Ear: External ear normal.  Eyes: Pupils are equal, round, and reactive to light. Right eye exhibits no discharge. Left eye exhibits no discharge. No scleral icterus.  Neck: Normal range of motion. Neck supple.  Cardiovascular: Normal rate.  Exam reveals no gallop and no friction rub.   No murmur heard. Pulmonary/Chest: Effort normal and breath sounds normal. No stridor. No respiratory distress. He has no wheezes. He has no rales. He exhibits no tenderness.  Abdominal: Soft. He exhibits no distension and no mass. There is no tenderness. There is no rebound and no guarding.  Musculoskeletal: He exhibits no edema or tenderness.  BL BKA  Neurological: He is alert and oriented to person, place, and time.  Skin: Skin is warm and dry. No rash noted. He is not diaphoretic. No erythema.    ED Course  Procedures (including critical care time) Labs Review Labs Reviewed  CBC WITH DIFFERENTIAL/PLATELET - Abnormal; Notable for the following:    WBC 18.7 (*)    RBC 3.77 (*)    Hemoglobin 11.0 (*)    HCT 35.6 (*)    Platelets 467 (*)    Neutrophils Relative % 90 (*)    Neutro Abs 16.9 (*)    Lymphocytes Relative 6 (*)    All other components within normal limits  URINALYSIS, ROUTINE W REFLEX MICROSCOPIC (NOT AT [facility]) - Abnormal; Notable for the following:    Glucose, UA >1000 (*)    All other components within normal limits  BASIC METABOLIC PANEL - Abnormal; Notable for the following:    Sodium 113 (*)     Potassium 6.2 (*)    Chloride 78 (*)    CO2 20 (*)    Glucose, Bld 1596 (*)    BUN 61 (*)    Creatinine, Ser 2.57 (*)    Calcium 8.6 (*)    GFR calc non Af Amer 28 (*)  GFR calc Af Amer 32 (*)    All other components within normal limits  BASIC METABOLIC PANEL - Abnormal; Notable for the following:    Sodium 120 (*)    Chloride 85 (*)    Glucose, Bld 1056 (*)    BUN 65 (*)    Creatinine, Ser 2.44 (*)    Calcium 8.7 (*)    GFR calc non Af Amer 29 (*)    GFR calc Af Amer 34 (*)    All other components within normal limits  HEPATIC FUNCTION PANEL - Abnormal; Notable for the following:    Bilirubin, Direct <0.1 (*)    All other components within normal limits  LIPASE, BLOOD - Abnormal; Notable for the following:    Lipase 20 (*)    All other components within normal limits  CBG MONITORING, ED - Abnormal; Notable for the following:    Glucose-Capillary >600 (*)    All other components within normal limits  I-STAT CHEM 8, ED - Abnormal; Notable for the following:    Sodium 113 (*)    Potassium 6.3 (*)    Chloride 82 (*)    BUN 61 (*)    Creatinine, Ser 2.30 (*)    Glucose, Bld >700 (*)    Calcium, Ion 1.09 (*)    All other components within normal limits  I-STAT VENOUS BLOOD GAS, ED - Abnormal; Notable for the following:    pH, Ven 7.381 (*)    pCO2, Ven 37.9 (*)    pO2, Ven 89.0 (*)    All other components within normal limits  CBG MONITORING, ED - Abnormal; Notable for the following:    Glucose-Capillary >600 (*)    All other components within normal limits  CBG MONITORING, ED - Abnormal; Notable for the following:    Glucose-Capillary >600 (*)    All other components within normal limits  CBG MONITORING, ED - Abnormal; Notable for the following:    Glucose-Capillary >600 (*)    All other components within normal limits  CBG MONITORING, ED - Abnormal; Notable for the following:    Glucose-Capillary >600 (*)    All other components within normal limits  CBG  MONITORING, ED - Abnormal; Notable for the following:    Glucose-Capillary >600 (*)    All other components within normal limits  URINE MICROSCOPIC-ADD ON  TROPONIN I    Imaging Review Dg Chest Portable 1 View  09/13/2014   CLINICAL DATA:  Hyperglycemia  EXAM: PORTABLE CHEST - 1 VIEW  COMPARISON:  12/12/2013  FINDINGS: The heart size and mediastinal contours are within normal limits. Both lungs are clear. The visualized skeletal structures are unremarkable.  IMPRESSION: Normal chest x-ray.   Electronically Signed   By: Marijo Sanes M.D.   On: 09/13/2014 19:04     EKG Interpretation   Date/Time:  Sunday September 13 2014 18:36:11 EDT Ventricular Rate:  114 PR Interval:  159 QRS Duration: 110 QT Interval:  350 QTC Calculation: 482 R Axis:   -60 Text Interpretation:  Sinus tachycardia Left anterior fascicular block  Abnormal R-wave progression, early transition ST elevation, consider  inferior injury Borderline prolonged QT interval No significant change  since last tracing Confirmed by Debby Freiberg [phone]) on 09/13/2014  9:14:46 PM      MDM   49 year old male with a history of hypertension, diabetes with peripheral neuropathy requiring bilateral BKA, CKD, and history of alcohol and drug abuse presents to the ED with 1 day of vomiting after drinking  the night before. History and exam as above. Additionally patient reported that his sugars have been elevated for the past few days. Otherwise he denies any fevers, illnesses, or infections.  EKG with sinus tachycardia with hyperacute T waves. No evidence of acute ischemia arrhythmia or blocks.  Initial CBG above 600, formal glucose of 1596. Labs revealed a corrected hyponatremia, hyperkalemia at 6.2, worsened renal insufficiency, no evidence of DKA. Presentation likely HHS.  Patient initiated on IV fluids and insulin infusion. Patient admitted to medicine for further management.  Final diagnoses:  Uncontrolled type 2 DM with  hyperosmolar nonketotic hyperglycemia   Patient seen in conjunction with Dr. Colin Rhein.  Sibyl Parr, MD. Resident      Addison Lank, MD 09/14/14 4010  Debby Freiberg, MD 09/15/14 [phone]

## 2014-09-13 NOTE — ED Notes (Signed)
Glucose stabilizer said to change rate 21.6, insulin out of guardrails of 18 units per hour, MD aware rate left at 16.2

## 2014-09-13 NOTE — H&P (Signed)
Date: 09/14/2014               Patient Name:  [name] MRN: [id number]  DOB: 02-18-1966 Age / Sex: 49 y.o., male   PCP: [name], MD         Medical Service: Internal Medicine Teaching Service         Attending Physician: Dr. Aldine Contes, MD    First Contact: Dr. Genene Churn Pager: [phone]  Second Contact: Dr. Naaman Plummer Pager: [phone]       After Hours (After 5p/  First Contact Pager: [phone]  weekends / holidays): Second Contact Pager: [phone]   Chief Complaint: elevated blood glucose  History of Present Illness: Pt is a 49 y/o M w/ PMHx of uncontrolled DM (HbA1c 9.6 on 05/2014), HTN, CKD, GERD, and depression who presents with elevated blood glucose. Pt states last night he checked his CBG and it was around 600, he then took lantus 25 units and 6-8 units of novolog which brought his glucose down to around 400. The next morning his CBG was around 600 and he took lantus 20 units and novolog 6 units. His glucose was still elevated after and he called EMS. Pt is suppose to be on lantus 44 units daily and novolog 1-10 units TID w/ meals. Pt admits to medication non compliance and poor diet. The day before admission pt had nausea w/ vomiting and had blood in his vomit. He has abdominal pain that is burning in nature and states he feels bloated. Passing gas and burping improves abd pain. He had a total of 40 ounces of beer yesterday. He also had burning chest pain that is located over midepigastrium last night that has since resolved. Denies recent illness, cough, SOB, diarrhea, NS, fevers, dysuria, confusion, and blurry vision. He has dizziness, sore throat, fatigue, polyuria, and polydipsia. Pt started on insulin infusion in the ED and given 1L NS bolus.   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   Meds: Current Facility-Administered Medications  Medication Dose Route Frequency Provider Last Rate Last Dose  . 0.9 %  sodium chloride infusion  1,000 mL Intravenous  Continuous Addison Lank, MD 125 mL/hr at 09/13/14 2041 1,000 mL at 09/13/14 2041  . dextrose 5 %-0.45 % sodium chloride infusion   Intravenous Continuous Addison Lank, MD      . insulin regular (NOVOLIN R,HUMULIN R) 250 Units in sodium chloride 0.9 % 250 mL (1 Units/mL) infusion   Intravenous Continuous Addison Lank, MD 18 mL/hr at 09/14/14 0031 18 Units/hr at 09/14/14 0031  . morphine 2 MG/ML injection 1 mg  1 mg Intravenous Q4H PRN Ejiroghene Arlyce Dice, MD   1 mg at 09/14/14 0107   Current Outpatient Prescriptions  Medication Sig Dispense Refill  . acetaminophen-codeine (TYLENOL #3) 300-30 MG per tablet Take 1-2 tablets by mouth every 4 (four) hours as needed for moderate pain. 60 tablet 3  . gabapentin (NEURONTIN) 600 MG tablet Take 1 tablet (600 mg total) by mouth 3 (three) times daily. 90 tablet 6  . hydrochlorothiazide (MICROZIDE) 12.5 MG capsule Take 1 capsule (12.5 mg total) by mouth daily. 30 capsule 1  . insulin aspart (NOVOLOG) 100 UNIT/ML injection Inject 1-10 Units into the skin 3 (three) times daily before meals.     . insulin glargine (LANTUS) 100 UNIT/ML injection Inject 0.44 mLs (44 Units total) into the skin every morning. 10 mL 6  . lisinopril (PRINIVIL,ZESTRIL) 40 MG tablet Take 0.5 tablets (20 mg total) by  mouth daily. 60 tablet 2  . pantoprazole (PROTONIX) 40 MG tablet Take 40 mg by mouth daily as needed (for acid reflux).    . polyethylene glycol (MIRALAX / GLYCOLAX) packet Take 17 g by mouth daily. 14 each 3  . sildenafil (REVATIO) 20 MG tablet Take 2-5 tablets before sexual activity. 50 tablet 1  . ACCU-CHEK FASTCLIX LANCETS MISC 1 each by Does not apply route 4 (four) times daily -  before meals and at bedtime. 102 each 11  . ferrous sulfate 325 (65 FE) MG tablet Take 1 tablet (325 mg total) by mouth 3 (three) times daily with meals. 90 tablet 3  . glucose blood (ACCU-CHEK SMARTVIEW) test strip 1 each by Other route 4 (four) times daily -  before meals and at bedtime.  Use as instructed 100 each 12  . nicotine (NICODERM CQ - DOSED IN MG/24 HOURS) 14 mg/24hr patch Place 1 patch onto the skin daily. 28 patch 0    Allergies: Allergies as of 09/13/2014  . (No Known Allergies)   Past Medical History  Diagnosis Date  . Onychomycosis   . Hypertension   . Diabetic peripheral neuropathy   . Diabetic foot ulcer     s/p Right first ray amputation, left transmetatarsal amputation with revision  . History of drug abuse     Cocaine and marijuana  . Exposure to trichomonas     treated empirically  . Cellulitis of left foot 03/2008    left 4th and 5th metatarsal area  . Hyperlipidemia   . 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 Mallory [name] tear April 2009  . History of prolonged Q-T interval on ECG   . 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  . 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 CKD (chronic kidney disease) stage 2, GFR 60-89 ml/min     BL SCr 1.3-1.4  . Depression   . Peripheral vascular disease   . Type 2 diabetes mellitus, uncontrolled, with renal complications   . GERD (gastroesophageal reflux disease)   . 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 Cellulitis and abscess of leg 01/31/2014  . Osteomyelitis of left leg 01/21/2014  . History of left below knee amputation 01/21/2014    Performed on    . Status post below knee amputation of right lower extremity 03/23/2014   Past Surgical History  Procedure Laterality Date  . Toe amputation  06/2011    left; 4th toe  . Nephrectomy      L side  . I&d extremity   08/24/2011    Procedure: IRRIGATION AND DEBRIDEMENT EXTREMITY;  Surgeon: Meredith Pel, MD;  Location: [place];  Service: Orthopedics;  Laterality: Left;  . Amputation  08/24/2011    Procedure: AMPUTATION RAY;  Surgeon: Meredith Pel, MD;  Location: [place];  Service: Orthopedics;  Laterality: Left;  left fourth toe Ray Resection  . Amputation  12/05/2011    Procedure: AMPUTATION RAY;  Surgeon: Meredith Pel, MD;  Location: [place];  Service: Orthopedics;  Laterality: Left;  . I&d extremity  02/18/2012    Procedure: IRRIGATION AND DEBRIDEMENT EXTREMITY;  Surgeon: Mcarthur Rossetti, MD;  Location: [place];  Service: Orthopedics;  Laterality: Left;  . Amputation  02/18/2012    Procedure: AMPUTATION DIGIT;  Surgeon: Mcarthur Rossetti, MD;  Location: [place];  Service: Orthopedics;  Laterality: Left;  revison transmetatarsal  . I&d extremity Left 08/28/2012    Procedure: IRRIGATION AND DEBRIDEMENT EXTREMITY;  Surgeon: Linna Hoff, MD;  Location: [place];  Service: Orthopedics;  Laterality: Left;  CYSTO TUBING/IRRIGATION, LEAD HAND.  . I&d extremity Left 08/30/2012    Procedure: IRRIGATION AND DEBRIDEMENT EXTREMITY;  Surgeon: Linna Hoff, MD;  Location: [place];  Service: Orthopedics;  Laterality: Left;  . Amputation Right 09/01/2012    Procedure: FOOT 1ST RAY AMPUTATION;  Surgeon: Newt Minion, MD;  Location: [place];  Service: Orthopedics;  Laterality: Right;  . Amputation Left 09/01/2012    Procedure: FOOT REVISION TRANSMETATARSAL AMPUTATION ;  Surgeon: Newt Minion, MD;  Location: [place];  Service: Orthopedics;  Laterality: Left;  . Amputation Right 09/13/2012    Procedure: AMPUTATION BELOW KNEE;  Surgeon: Newt Minion, MD;  Location: [place];  Service: Orthopedics;  Laterality: Right;  Right Below Knee Amputation  . Amputation Left 12/12/2013    Procedure: Left Below Knee Amputation;  Surgeon: Newt Minion, MD;  Location: [place];  Service: Orthopedics;  Laterality: Left;  . Stump revision  Left 01/23/2014    Procedure: STUMP REVISION;  Surgeon: Newt Minion, MD;  Location: [place];  Service: Orthopedics;  Laterality: Left;   Family History  Problem Relation Age of Onset  . Diabetes type II Mother   . Pancreatic cancer Father    History   Social History  . Marital Status: Married    Spouse Name: N/A  . Number of Children: N/A  . Years of Education: 12   Occupational History  . disabled    Social History Main Topics  . Smoking status: Current Every Day Smoker -- 0.50 packs/day for 34 years    Types: Cigarettes  . Smokeless tobacco: Never Used     Comment: 1/2 PPD  . Alcohol Use: 0.0 oz/week    0 Standard drinks or equivalent per week     Comment: occ  . Drug Use: No     Comment: NO LONGER USES-GRADUATING FROM GEO CRE AFTER  3-4 MONTHS                                                                                                                                       . Sexual Activity: Not on file   Other Topics Concern  . Not on file   Social History Narrative    Review of Systems: Pertinent items are noted in HPI.  Physical Exam: Blood pressure 122/72, pulse 98, temperature 98 F (36.7 C), temperature source  Oral, resp. rate 24, height 5\' 9"  (1.753 m), weight 210 lb (95.255 kg), SpO2 96 %. Physical Exam  Constitutional: He appears well-developed. He appears distressed (leaning forward).  Eyes: Conjunctivae are normal. Scleral icterus is present.  Cardiovascular: Regular rhythm.  Tachycardia present.   No murmur heard. Pulmonary/Chest: Effort normal and breath sounds normal. He has no wheezes.  Abdominal: Soft. Bowel sounds are normal. There is tenderness (periumbilical region). There is no rebound.  Musculoskeletal:  B/l AKA  Neurological: He is alert.  Skin: Skin is warm and dry.    Lab results: Basic Metabolic Panel:  Recent Labs  09/13/14 1840 09/13/14 1848 09/13/14 2231  NA 113* 113* 120*  K 6.2* 6.3* 4.9  CL 78* 82* 85*  CO2 20*  --   22  GLUCOSE 1596* >700* 1056*  BUN 61* 61* 65*  CREATININE 2.57* 2.30* 2.44*  CALCIUM 8.6*  --  8.7*   CBC:  Recent Labs  09/13/14 1840 09/13/14 1848  WBC 18.7*  --   NEUTROABS 16.9*  --   HGB 11.0* 13.3  HCT 35.6* 39.0  MCV 94.4  --   PLT 467*  --    CBG:  Recent Labs  09/13/14 1944 09/13/14 2104 09/13/14 2209 09/13/14 2317 09/14/14 0029 09/14/14 0134  GLUCAP >600* >600* >600* >600* >600* 501*   Urinalysis:  Recent Labs  09/13/14 1840  COLORURINE YELLOW  LABSPEC 1.027  PHURINE 5.5  GLUCOSEU >1000*  HGBUR NEGATIVE  BILIRUBINUR NEGATIVE  KETONESUR NEGATIVE  PROTEINUR NEGATIVE  UROBILINOGEN 0.2  NITRITE NEGATIVE  LEUKOCYTESUR NEGATIVE   Imaging results:  Dg Chest Portable 1 View  09/13/2014   CLINICAL DATA:  Hyperglycemia  EXAM: PORTABLE CHEST - 1 VIEW  COMPARISON:  12/12/2013  FINDINGS: The heart size and mediastinal contours are within normal limits. Both lungs are clear. The visualized skeletal structures are unremarkable.  IMPRESSION: Normal chest x-ray.   Electronically Signed   By: Marijo Sanes M.D.   On: 09/13/2014 19:04    Other results: EKG: sinus tachycardia, HR 114, peaked T waves in II, III, V2-5.  Assessment & Plan by Problem: Active Problems:   Type 2 diabetes mellitus with hyperosmolarity without nonketotic hyperglycemic-hyperosmolar coma (nkhhc)  Hyperosmolar hyperglycemic state-- Presenting glucose 1596 on admission, corrected  Na 137. UA neg for ketones, AG 15, and HCO3 >18 thus likely HHS vs DKA due to medication non compliance. CXR WNL. UA neg for infection. Leukocytosis of 18.7, no source of infxn, could be due to hypercortisolemia and increased catecholamine secretion. He received 1L bolus of NS in the ED.  - admit to step down - NS at 125cc/hr and NS 1L bolus - continue glucose stabilizer - BMET q4h, CBG q1h - lipase, LFTs, and  Troponin x1 to look for other cause of HHS. - consult to care management - HbA1c   HTN-- elevated  likely 2/2 abdominal pain -hold home lisinopril 40mg  qd, HCTZ 12.5mg  qd due to AKI  Acute on CKD 2-- has left nephrectomy due to chronic pyelonephritis. b/l Cr around 1.4. Cr on admission 2.57. Likely pre-renal. BUN/Cr ratio>20.  - continue IVF at 125cc/hr and follow BMET  GERD w/ esophagitis-- Likely blood in vomit due to uncontrolled acid reflux. States he has bloated sensation. burning stomach pain.  - continue home protonix   Phantom limb pain - cont home neurontin 600mg  TID  Depression - cont home prozac 10mg   Tobacco abuse-- smokes 1/2 PPD since he was 49 y/o - nicotine 21 mg patch  FEN:  - diet-NPO - Hyperkalemia-- K+ 6.3 w/ peaked T waves on EKG. Given calcium gluconate x 1 dose. Repeat BMET reveals K+ of 4.9 w/ insulin infusion   DVT ppx-- heparin  Dispo: Disposition is deferred at this time, awaiting improvement of current medical problems.   The patient does have a current PCP [name], MD) and does need an [facility] hospital follow-up appointment after discharge.  The patient does not have transportation limitations that hinder transportation to clinic appointments.  Signed: Norman Herrlich, MD 09/14/2014, 1:45 AM

## 2014-09-13 NOTE — ED Notes (Signed)
Attempted second IV start unsuccessful

## 2014-09-13 NOTE — ED Notes (Signed)
PER EMS: pt from home, checked his CBG last night and and it read high. He checked it again this morning and it read high again, over "4 something." EMS checked it and it was over 600 and pt reports nausea. He says he only has one kidney but is not on dialysis. Bilateral BKA. A&Ox4. BP-180/140,HR-115, 02-100% RA.

## 2014-09-14 DIAGNOSIS — Z89022 Acquired absence of left finger(s): Secondary | ICD-10-CM | POA: Diagnosis not present

## 2014-09-14 DIAGNOSIS — Z794 Long term (current) use of insulin: Secondary | ICD-10-CM

## 2014-09-14 DIAGNOSIS — Z89511 Acquired absence of right leg below knee: Secondary | ICD-10-CM

## 2014-09-14 DIAGNOSIS — Z89512 Acquired absence of left leg below knee: Secondary | ICD-10-CM

## 2014-09-14 DIAGNOSIS — I739 Peripheral vascular disease, unspecified: Secondary | ICD-10-CM | POA: Diagnosis present

## 2014-09-14 DIAGNOSIS — K219 Gastro-esophageal reflux disease without esophagitis: Secondary | ICD-10-CM | POA: Diagnosis not present

## 2014-09-14 DIAGNOSIS — N179 Acute kidney failure, unspecified: Secondary | ICD-10-CM

## 2014-09-14 DIAGNOSIS — I129 Hypertensive chronic kidney disease with stage 1 through stage 4 chronic kidney disease, or unspecified chronic kidney disease: Secondary | ICD-10-CM

## 2014-09-14 DIAGNOSIS — E1100 Type 2 diabetes mellitus with hyperosmolarity without nonketotic hyperglycemic-hyperosmolar coma (NKHHC): Principal | ICD-10-CM

## 2014-09-14 DIAGNOSIS — K210 Gastro-esophageal reflux disease with esophagitis: Secondary | ICD-10-CM | POA: Diagnosis present

## 2014-09-14 DIAGNOSIS — F329 Major depressive disorder, single episode, unspecified: Secondary | ICD-10-CM | POA: Diagnosis present

## 2014-09-14 DIAGNOSIS — E1122 Type 2 diabetes mellitus with diabetic chronic kidney disease: Secondary | ICD-10-CM | POA: Diagnosis not present

## 2014-09-14 DIAGNOSIS — Z9114 Patient's other noncompliance with medication regimen: Secondary | ICD-10-CM | POA: Diagnosis present

## 2014-09-14 DIAGNOSIS — R739 Hyperglycemia, unspecified: Secondary | ICD-10-CM | POA: Diagnosis present

## 2014-09-14 DIAGNOSIS — Z905 Acquired absence of kidney: Secondary | ICD-10-CM | POA: Diagnosis present

## 2014-09-14 DIAGNOSIS — E875 Hyperkalemia: Secondary | ICD-10-CM | POA: Diagnosis present

## 2014-09-14 DIAGNOSIS — N182 Chronic kidney disease, stage 2 (mild): Secondary | ICD-10-CM | POA: Diagnosis present

## 2014-09-14 DIAGNOSIS — Z8711 Personal history of peptic ulcer disease: Secondary | ICD-10-CM | POA: Diagnosis not present

## 2014-09-14 DIAGNOSIS — E1165 Type 2 diabetes mellitus with hyperglycemia: Secondary | ICD-10-CM | POA: Diagnosis present

## 2014-09-14 DIAGNOSIS — I7389 Other specified peripheral vascular diseases: Secondary | ICD-10-CM | POA: Diagnosis present

## 2014-09-14 DIAGNOSIS — E785 Hyperlipidemia, unspecified: Secondary | ICD-10-CM | POA: Diagnosis present

## 2014-09-14 DIAGNOSIS — F17210 Nicotine dependence, cigarettes, uncomplicated: Secondary | ICD-10-CM | POA: Diagnosis present

## 2014-09-14 DIAGNOSIS — F17200 Nicotine dependence, unspecified, uncomplicated: Secondary | ICD-10-CM

## 2014-09-14 DIAGNOSIS — N119 Chronic tubulo-interstitial nephritis, unspecified: Secondary | ICD-10-CM | POA: Diagnosis present

## 2014-09-14 DIAGNOSIS — G546 Phantom limb syndrome with pain: Secondary | ICD-10-CM | POA: Diagnosis present

## 2014-09-14 DIAGNOSIS — K920 Hematemesis: Secondary | ICD-10-CM | POA: Diagnosis present

## 2014-09-14 DIAGNOSIS — E1142 Type 2 diabetes mellitus with diabetic polyneuropathy: Secondary | ICD-10-CM | POA: Diagnosis present

## 2014-09-14 DIAGNOSIS — K208 Other esophagitis: Secondary | ICD-10-CM | POA: Diagnosis present

## 2014-09-14 DIAGNOSIS — D649 Anemia, unspecified: Secondary | ICD-10-CM

## 2014-09-14 DIAGNOSIS — K449 Diaphragmatic hernia without obstruction or gangrene: Secondary | ICD-10-CM | POA: Diagnosis present

## 2014-09-14 DIAGNOSIS — I10 Essential (primary) hypertension: Secondary | ICD-10-CM | POA: Diagnosis not present

## 2014-09-14 DIAGNOSIS — Z89021 Acquired absence of right finger(s): Secondary | ICD-10-CM | POA: Diagnosis not present

## 2014-09-14 DIAGNOSIS — E871 Hypo-osmolality and hyponatremia: Secondary | ICD-10-CM | POA: Diagnosis present

## 2014-09-14 LAB — GLUCOSE, CAPILLARY
GLUCOSE-CAPILLARY: 191 mg/dL — AB (ref 65–99)
GLUCOSE-CAPILLARY: 151 mg/dL — AB (ref 65–99)
GLUCOSE-CAPILLARY: 152 mg/dL — AB (ref 65–99)
Glucose-Capillary: 522 mg/dL — ABNORMAL HIGH (ref 65–99)
Glucose-Capillary: 267 mg/dL — ABNORMAL HIGH (ref 65–99)
Glucose-Capillary: 272 mg/dL — ABNORMAL HIGH (ref 65–99)
Glucose-Capillary: 331 mg/dL — ABNORMAL HIGH (ref 65–99)
Glucose-Capillary: 261 mg/dL — ABNORMAL HIGH (ref 65–99)
Glucose-Capillary: 141 mg/dL — ABNORMAL HIGH (ref 65–99)
Glucose-Capillary: 272 mg/dL — ABNORMAL HIGH (ref 65–99)
Glucose-Capillary: 246 mg/dL — ABNORMAL HIGH (ref 65–99)

## 2014-09-14 LAB — BASIC METABOLIC PANEL
ANION GAP: 15 (ref 5–15)
Anion gap: 8 (ref 5–15)
BUN: 71 mg/dL — ABNORMAL HIGH (ref 6–20)
BUN: 62 mg/dL — ABNORMAL HIGH (ref 6–20)
CALCIUM: 8.7 mg/dL — AB (ref 8.9–10.3)
CO2: 23 mmol/L (ref 22–32)
CO2: 24 mmol/L (ref 22–32)
Calcium: 9.4 mg/dL (ref 8.9–10.3)
Chloride: 97 mmol/L — ABNORMAL LOW (ref 101–111)
Chloride: 106 mmol/L (ref 101–111)
Creatinine, Ser: 2.3 mg/dL — ABNORMAL HIGH (ref 0.61–1.24)
Creatinine, Ser: 1.92 mg/dL — ABNORMAL HIGH (ref 0.61–1.24)
GFR calc Af Amer: 37 mL/min — ABNORMAL LOW (ref >60)
GFR calc Af Amer: 46 mL/min — ABNORMAL LOW (ref >60)
GFR calc non Af Amer: 32 mL/min — ABNORMAL LOW (ref >60)
GFR calc non Af Amer: 39 mL/min — ABNORMAL LOW (ref >60)
GLUCOSE: 309 mg/dL — AB (ref 65–99)
GLUCOSE: 105 mg/dL — AB (ref 65–99)
POTASSIUM: 4.4 mmol/L (ref 3.5–5.1)
POTASSIUM: 4.2 mmol/L (ref 3.5–5.1)
Sodium: 135 mmol/L (ref 135–145)
Sodium: 138 mmol/L (ref 135–145)

## 2014-09-14 LAB — HEPATIC FUNCTION PANEL
ALBUMIN: 3.5 g/dL (ref 3.5–5.0)
ALT: 26 U/L (ref 17–63)
AST: 26 U/L (ref 15–41)
Alkaline Phosphatase: 125 U/L (ref 38–126)
Bilirubin, Direct: <0.1 mg/dL — ABNORMAL LOW (ref 0.1–0.5)
TOTAL PROTEIN: 7.1 g/dL (ref 6.5–8.1)
Total Bilirubin: 0.5 mg/dL (ref 0.3–1.2)

## 2014-09-14 LAB — CBC
HCT: 25.5 % — ABNORMAL LOW (ref 39.0–52.0)
Hemoglobin: 9 g/dL — ABNORMAL LOW (ref 13.0–17.0)
MCH: 29.9 pg (ref 26.0–34.0)
MCHC: 35.3 g/dL (ref 30.0–36.0)
MCV: 84.7 fL (ref 78.0–100.0)
Platelets: 389 10*3/uL (ref 150–400)
RBC: 3.01 MIL/uL — ABNORMAL LOW (ref 4.22–5.81)
RDW: 12.3 % (ref 11.5–15.5)
WBC: 19.7 10*3/uL — AB (ref 4.0–10.5)

## 2014-09-14 LAB — CBG MONITORING, ED
Glucose-Capillary: 501 mg/dL — ABNORMAL HIGH (ref 65–99)
Glucose-Capillary: >600 mg/dL (ref 65–99)

## 2014-09-14 LAB — LIPASE, BLOOD: Lipase: 20 U/L — ABNORMAL LOW (ref 22–51)

## 2014-09-14 LAB — ETHANOL

## 2014-09-14 LAB — TROPONIN I: Troponin I: <0.03 ng/mL (ref <0.031)

## 2014-09-14 MED ORDER — INSULIN GLARGINE 100 UNIT/ML ~~LOC~~ SOLN
20.0000 [IU] | Freq: Every day | SUBCUTANEOUS | Status: DC
  Administered 2014-09-14: 20 [IU] via SUBCUTANEOUS
  Filled 2014-09-14: qty 0.2

## 2014-09-14 MED ORDER — HEPARIN SODIUM (PORCINE) 5000 UNIT/ML IJ SOLN
5000.0000 [IU] | Freq: Three times a day (TID) | INTRAMUSCULAR | Status: DC
  Administered 2014-09-14 – 2014-09-15 (×6): 5000 [IU] via SUBCUTANEOUS
  Filled 2014-09-14 (×11): qty 1

## 2014-09-14 MED ORDER — INSULIN ASPART 100 UNIT/ML ~~LOC~~ SOLN: 0.0000 [IU] | Freq: Three times a day (TID) | SUBCUTANEOUS | Status: DC

## 2014-09-14 MED ORDER — NICOTINE 21 MG/24HR TD PT24
21.0000 mg | MEDICATED_PATCH | Freq: Every day | TRANSDERMAL | Status: DC
  Administered 2014-09-14 – 2014-09-16 (×3): 21 mg via TRANSDERMAL
  Filled 2014-09-14 (×3): qty 1

## 2014-09-14 MED ORDER — SODIUM CHLORIDE 0.9 % IV BOLUS (SEPSIS): 500.0000 mL | Freq: Once | INTRAVENOUS | Status: DC

## 2014-09-14 MED ORDER — INSULIN ASPART 100 UNIT/ML ~~LOC~~ SOLN
0.0000 [IU] | Freq: Three times a day (TID) | SUBCUTANEOUS | Status: DC
  Administered 2014-09-14: 4 [IU] via SUBCUTANEOUS

## 2014-09-14 MED ORDER — PANTOPRAZOLE SODIUM 40 MG PO TBEC: 40.0000 mg | DELAYED_RELEASE_TABLET | Freq: Every day | ORAL | Status: DC | PRN

## 2014-09-14 MED ORDER — PANTOPRAZOLE SODIUM 40 MG PO TBEC
40.0000 mg | DELAYED_RELEASE_TABLET | Freq: Two times a day (BID) | ORAL | Status: DC
  Administered 2014-09-14 – 2014-09-16 (×4): 40 mg via ORAL
  Filled 2014-09-14 (×3): qty 1

## 2014-09-14 MED ORDER — SODIUM CHLORIDE 0.9 % IV SOLN
INTRAVENOUS | Status: DC
  Administered 2014-09-14: 125 mL/h via INTRAVENOUS

## 2014-09-14 MED ORDER — HEPARIN SODIUM (PORCINE) 5000 UNIT/ML IJ SOLN: 5000.0000 [IU] | Freq: Three times a day (TID) | INTRAMUSCULAR | Status: DC

## 2014-09-14 MED ORDER — FOLIC ACID 1 MG PO TABS
1.0000 mg | ORAL_TABLET | Freq: Every day | ORAL | Status: DC
  Administered 2014-09-14 – 2014-09-16 (×3): 1 mg via ORAL
  Filled 2014-09-14 (×3): qty 1

## 2014-09-14 MED ORDER — INSULIN ASPART 100 UNIT/ML ~~LOC~~ SOLN
0.0000 [IU] | Freq: Three times a day (TID) | SUBCUTANEOUS | Status: DC
  Administered 2014-09-15: 5 [IU] via SUBCUTANEOUS
  Administered 2014-09-15: 9 [IU] via SUBCUTANEOUS

## 2014-09-14 MED ORDER — PANTOPRAZOLE SODIUM 40 MG PO TBEC
40.0000 mg | DELAYED_RELEASE_TABLET | Freq: Every day | ORAL | Status: DC
  Administered 2014-09-14: 40 mg via ORAL
  Filled 2014-09-14: qty 1

## 2014-09-14 MED ORDER — ONDANSETRON HCL 4 MG/2ML IJ SOLN
4.0000 mg | Freq: Three times a day (TID) | INTRAMUSCULAR | Status: DC | PRN
  Administered 2014-09-14 (×2): 4 mg via INTRAVENOUS
  Filled 2014-09-14 (×2): qty 2

## 2014-09-14 MED ORDER — FERROUS SULFATE 325 (65 FE) MG PO TABS
325.0000 mg | ORAL_TABLET | Freq: Three times a day (TID) | ORAL | Status: DC
  Administered 2014-09-15 – 2014-09-16 (×4): 325 mg via ORAL
  Filled 2014-09-14 (×8): qty 1

## 2014-09-14 MED ORDER — SODIUM CHLORIDE 0.9 % IV BOLUS (SEPSIS)
500.0000 mL | Freq: Once | INTRAVENOUS | Status: AC
  Administered 2014-09-14: 500 mL via INTRAVENOUS

## 2014-09-14 MED ORDER — ONDANSETRON HCL 4 MG/2ML IJ SOLN
4.0000 mg | Freq: Once | INTRAMUSCULAR | Status: AC
  Administered 2014-09-14: 4 mg via INTRAVENOUS
  Filled 2014-09-14: qty 2

## 2014-09-14 MED ORDER — MORPHINE SULFATE 2 MG/ML IJ SOLN
1.0000 mg | INTRAMUSCULAR | Status: DC | PRN
  Administered 2014-09-14 (×3): 1 mg via INTRAVENOUS
  Filled 2014-09-14 (×3): qty 1

## 2014-09-14 MED ORDER — GABAPENTIN 600 MG PO TABS
600.0000 mg | ORAL_TABLET | Freq: Three times a day (TID) | ORAL | Status: DC
  Administered 2014-09-14 – 2014-09-16 (×7): 600 mg via ORAL
  Filled 2014-09-14 (×9): qty 1

## 2014-09-14 MED ORDER — SUCRALFATE 1 GM/10ML PO SUSP
1.0000 g | Freq: Three times a day (TID) | ORAL | Status: DC
  Administered 2014-09-14 – 2014-09-16 (×6): 1 g via ORAL
  Filled 2014-09-14 (×10): qty 10

## 2014-09-14 MED ORDER — SODIUM CHLORIDE 0.9 % IV BOLUS (SEPSIS)
1000.0000 mL | Freq: Once | INTRAVENOUS | Status: AC
  Administered 2014-09-14: 1000 mL via INTRAVENOUS

## 2014-09-14 NOTE — Progress Notes (Signed)
Pts wife stated pt stomach is burning.  Pt co burning with all fluids. Paged Dr. Mathis Fare, will change protonix to bid and add carafate, check h-pylori.

## 2014-09-14 NOTE — ED Notes (Signed)
Attempted report 

## 2014-09-14 NOTE — Progress Notes (Signed)
Called and verified with attending that correct order for glucostabilizer is non-DKA.  She also requested that we call her with the results of the BMET due now.  MD advised that it is very difficult to obtain blood from fingertips for hourly CBG draws.  Upon arrival to the floor, the patient was stuck 4 times in the fingertips to obtain blood sample.  Will continue to monitor the patient.  [employer] RN-BC, WTA.

## 2014-09-14 NOTE — Progress Notes (Signed)
Called back to get report but ED RN was on the phone for another pt. Number left to colleague Shanon Brow.

## 2014-09-14 NOTE — Progress Notes (Signed)
New Admission Note:   Arrival Method: per stretcher from ED with RN Janett Billow Mental Orientation: lethargic, responds to voice, oriented X4 Telemetry: placed on telebox 6E23 after CCMD notified Assessment: Completed Skin: warm, dry, intact with old healed surgical scar on bilateral lower extremities from BKA years ago. No wounds noted. IV: G20 on right upper arm with insulin drip at 18/hr, G20 on the right AC saline locked, both with transparent dressing, clean, dry and intact Pain: denies having any pain as of this time Tubes: IV lines with NS at 125/hr, Insulin drip at 18/hr Safety Measures: [place] has been given, discussed and signed Admission: Completed [address] [place] Orientation: Patient has been orientated to the room, unit and staff.  Family: no family member noted at bedside  Orders have been reviewed and implemented. Will continue to monitor the patient. Call light has been placed within reach and bed alarm has been activated.   Georgeanna Harrison BSN, RN [place] [address] [place]

## 2014-09-14 NOTE — Progress Notes (Signed)
Pt CBG 272 on moderate scale 8 units, wife stated he is sensitive to fast acting insulin.  Pt stated would take half that 4 units.  Paged Dr. Mathis Fare advised pts wife stated sensitive to fast acting insulin. Dr. Mathis Fare OK to take only 4 units and will change pt to sensitive sliding scale.

## 2014-09-14 NOTE — Progress Notes (Signed)
Report taken from ED RN Janett Billow. Room ready for admission.

## 2014-09-14 NOTE — Progress Notes (Signed)
Subjective:  Still with small and infrequent episodes of coffee ground emesis. No severe pain with emesis or SOB. Wants to try diet. CBG's down to 100's.   Objective: Vital signs in last 24 hours: Filed Vitals:   09/14/14 0100 09/14/14 0222 09/14/14 0442 09/14/14 1042  BP: 122/72 128/71 134/79 126/84  Pulse: 98 99 104 103  Temp:  97.5 F (36.4 C) 97.6 F (36.4 C) 98.2 F (36.8 C)  TempSrc:  Oral Oral Oral  Resp:  20 18 18   Height:  5\' 9"  (1.753 m)    Weight:  195 lb 1.6 oz (88.497 kg)    SpO2: 96% 100% 100% 99%   Weight change:   Intake/Output Summary (Last 24 hours) at 09/14/14 1056 Last data filed at 09/14/14 0600  Gross per 24 hour  Intake 1368.75 ml  Output   2075 ml  Net -706.25 ml   Constitutional: He appears well-developed. In no acute distress. Frequent hiccups.  Eyes: Conjunctivae are normal. Scleral icterus is present.  Cardiovascular: Regular rhythm. Tachycardia present.  No murmur heard. Pulmonary/Chest: Effort normal and breath sounds normal. He has no wheezes.  Abdominal: Soft. Bowel sounds are normal. No tenderness to palpation. There is no rebound.  Musculoskeletal:  B/l BKA  Neurological: He is alert.  Skin: Skin is warm and dry.   Lab Results: Basic Metabolic Panel:  Recent Labs Lab 09/13/14 2231 09/14/14 0319  NA 120* 135  K 4.9 4.4  CL 85* 97*  CO2 22 23  GLUCOSE 1056* 309*  BUN 65* 71*  CREATININE 2.44* 2.30*  CALCIUM 8.7* 9.4   Liver Function Tests:  Recent Labs Lab 09/13/14 2231  AST 26  ALT 26  ALKPHOS 125  BILITOT 0.5  PROT 7.1  ALBUMIN 3.5    Recent Labs Lab 09/13/14 2231  LIPASE 20*   No results for input(s): AMMONIA in the last 168 hours. CBC:  Recent Labs Lab 09/13/14 1840 09/13/14 1848  WBC 18.7*  --   NEUTROABS 16.9*  --   HGB 11.0* 13.3  HCT 35.6* 39.0  MCV 94.4  --   PLT 467*  --    Cardiac Enzymes:  Recent Labs Lab 09/13/14 2230  TROPONINI <0.03   BNP: No results for input(s):  PROBNP in the last 168 hours. D-Dimer: No results for input(s): DDIMER in the last 168 hours. CBG:  Recent Labs Lab 09/14/14 0220 09/14/14 0327 09/14/14 0424 09/14/14 0544 09/14/14 0652 09/14/14 0814  GLUCAP 522* 267* 272* 331* 261* 191*  Urine Drug Screen: Drugs of Abuse   Alcohol Level:  Recent Labs Lab 09/14/14 0319  ETH <5   Urinalysis:  Recent Labs Lab 09/13/14 1840  COLORURINE YELLOW  LABSPEC 1.027  PHURINE 5.5  GLUCOSEU >1000*  HGBUR NEGATIVE  BILIRUBINUR NEGATIVE  KETONESUR NEGATIVE  PROTEINUR NEGATIVE  UROBILINOGEN 0.2  NITRITE NEGATIVE  LEUKOCYTESUR NEGATIVE   Misc. Labs:  Micro Results: No results found for this or any previous visit (from the past 240 hour(s)). Studies/Results: Dg Chest Portable 1 View  09/13/2014   CLINICAL DATA:  Hyperglycemia  EXAM: PORTABLE CHEST - 1 VIEW  COMPARISON:  12/12/2013  FINDINGS: The heart size and mediastinal contours are within normal limits. Both lungs are clear. The visualized skeletal structures are unremarkable.  IMPRESSION: Normal chest x-ray.   Electronically Signed   By: Marijo Sanes M.D.   On: 09/13/2014 19:04   Medications: I have reviewed the patient's current medications. Scheduled Meds: . gabapentin  600 mg Oral TID  .  heparin  5,000 Units Subcutaneous 3 times per day  . insulin glargine  20 Units Subcutaneous QHS  . nicotine  21 mg Transdermal Daily   Continuous Infusions: . sodium chloride 1,000 mL (09/14/14 0734)  . insulin (NOVOLIN-R) infusion 8 Units/hr (09/14/14 0653)   PRN Meds:.morphine injection, ondansetron (ZOFRAN) IV, pantoprazole Assessment/Plan: Active Problems:   Type 2 diabetes mellitus with hyperosmolarity without nonketotic hyperglycemic-hyperosmolar coma (nkhhc)   Uncontrolled type 2 DM with hyperosmolar nonketotic hyperglycemia  Type 2 diabetes mellitus with hyperosmolarity without nonketotic hyperglycemic-hyperosmolar coma (nkhhc)  Hyperosmolar hyperglycemic state--  Improved glucose to 191 this morning. Improved nausea.  Hx of DM II last hgba1c 9.6 on 05/2014. Home regimen lantus 44 units + novolog 1-10 units SSI TID.  Presenting glucose 1596 on admission, corrected Na 137. UA neg for ketones, AG 15, and HCO3 >18 thus likely HHS vs DKA due to medication non compliance. CXR WNL. UA neg for infection. Leukocytosis of 18.7, no source of infxn, could be due to hypercortisolemia and increased catecholamine secretion. He received 1L bolus of NS in the ED and was on fluid overnight, so far has received 5L fluids.  - transitioned to Lantus 20 units (home regimen is 44 unit but patient has been taking 25 units daily). Turn off insulin drip after 2 hours - clear liquid diet, advance as tolerated. - BMET q4h - consulted to care management for help with meds. - HbA1c pending.   Coffee ground hematemsis -with hx of GERD w/ esophagitis-- nausea likely 2/2 to HHS and blood due to uncontrolled acid reflux. No sigificant pain or SOB to suggest esophageal rupture. States he has bloated sensation. burning stomach pain.  - continue home protonix - monitor CBC closely. hgb last 11.0 (higher than his baseline about 9-10, has been low in 7-8's as well in the past.  - cont zofran and PRN morphine.   Chronic normocytic anemia - baseline has been as low as 7-8, but mainly 9-10's. Last hgb 11. Last anemia panel showed low iron and low normal ferritin. Low folate - cont PO iron and started PO folic acid daily. - monitor hgb.   Acute on CKD 2-- has left nephrectomy due to chronic pyelonephritis. b/l Cr around 1.4. Cr on admission 2.57. Likely pre-renal. BUN/Cr ratio>20.  Improving with IVF to 2.3 today. - continue IVF at 125cc/hr and follow BMET  Hyperkalemia-- resolved K+ 6.3 w/ peaked T waves on EKG. Given calcium gluconate x 1 dose. Repeat BMET reveals K+ of 4.9 w/ insulin infusion, now 4.4.  HTN-- normotensive now.  -hold home lisinopril 40mg  qd, HCTZ 12.5mg  qd due to  AKI  Phantom limb pain - cont home neurontin 600mg  TID  Depression - cont home prozac 10mg   Tobacco abuse-- smokes 1/2 PPD since he was 49 y/o - nicotine 21 mg patch  FEN:  - diet - clear - advance as tolerates.  DVT ppx-- heparin  Dispo: Disposition is deferred at this time, awaiting improvement of current medical problems.   The patient does have a current PCP [name], MD) and does need an [facility] hospital follow-up appointment after discharge.  The patient does not have transportation limitations that hinder transportation to clinic appointments.  .Services Needed at time of discharge: Y = Yes, Blank = No PT:   OT:   RN:   Equipment:   Other:     LOS: 0 days   Dellia Nims, MD 09/14/2014, 10:56 AM

## 2014-09-14 NOTE — Progress Notes (Signed)
Pt refused for Heparin shot to be given by RN. Instead he would like to self administer and stated "I know that. I have been doing that for a while". Pt gave himself Heparin subcutaneously on the right lower abdominal wall with the supervision of RN. Proper technique observed.

## 2014-09-15 DIAGNOSIS — K860 Alcohol-induced chronic pancreatitis: Secondary | ICD-10-CM

## 2014-09-15 DIAGNOSIS — Z598 Other problems related to housing and economic circumstances: Secondary | ICD-10-CM

## 2014-09-15 LAB — CBC
HEMATOCRIT: 23.3 % — AB (ref 39.0–52.0)
HEMATOCRIT: 23.9 % — AB (ref 39.0–52.0)
HEMOGLOBIN: 8 g/dL — AB (ref 13.0–17.0)
Hemoglobin: 8.1 g/dL — ABNORMAL LOW (ref 13.0–17.0)
MCH: 29.5 pg (ref 26.0–34.0)
MCH: 29.2 pg (ref 26.0–34.0)
MCHC: 34.3 g/dL (ref 30.0–36.0)
MCHC: 33.9 g/dL (ref 30.0–36.0)
MCV: 86 fL (ref 78.0–100.0)
MCV: 86.3 fL (ref 78.0–100.0)
PLATELETS: 364 10*3/uL (ref 150–400)
Platelets: 338 10*3/uL (ref 150–400)
RBC: 2.71 MIL/uL — AB (ref 4.22–5.81)
RBC: 2.77 MIL/uL — ABNORMAL LOW (ref 4.22–5.81)
RDW: 12.5 % (ref 11.5–15.5)
RDW: 12.3 % (ref 11.5–15.5)
WBC: 14.3 10*3/uL — ABNORMAL HIGH (ref 4.0–10.5)
WBC: 13.5 10*3/uL — ABNORMAL HIGH (ref 4.0–10.5)

## 2014-09-15 LAB — RETICULOCYTES
RBC.: 2.71 MIL/uL — ABNORMAL LOW (ref 4.22–5.81)
Retic Count, Absolute: 32.5 10*3/uL (ref 19.0–186.0)
Retic Ct Pct: 1.2 % (ref 0.4–3.1)

## 2014-09-15 LAB — BASIC METABOLIC PANEL
ANION GAP: 7 (ref 5–15)
BUN: 35 mg/dL — ABNORMAL HIGH (ref 6–20)
CALCIUM: 8.2 mg/dL — AB (ref 8.9–10.3)
CO2: 22 mmol/L (ref 22–32)
Chloride: 106 mmol/L (ref 101–111)
Creatinine, Ser: 1.79 mg/dL — ABNORMAL HIGH (ref 0.61–1.24)
GFR, EST AFRICAN AMERICAN: 50 mL/min — AB (ref >60)
GFR, EST NON AFRICAN AMERICAN: 43 mL/min — AB (ref >60)
Glucose, Bld: 308 mg/dL — ABNORMAL HIGH (ref 65–99)
Potassium: 4.3 mmol/L (ref 3.5–5.1)
Sodium: 135 mmol/L (ref 135–145)

## 2014-09-15 LAB — PROTIME-INR
INR: 1.2 (ref 0.00–1.49)
PROTHROMBIN TIME: 15.4 s — AB (ref 11.6–15.2)

## 2014-09-15 LAB — GLUCOSE, CAPILLARY
GLUCOSE-CAPILLARY: 267 mg/dL — AB (ref 65–99)
GLUCOSE-CAPILLARY: 306 mg/dL — AB (ref 65–99)
Glucose-Capillary: 353 mg/dL — ABNORMAL HIGH (ref 65–99)
Glucose-Capillary: 319 mg/dL — ABNORMAL HIGH (ref 65–99)

## 2014-09-15 LAB — FOLATE: Folate: 19.6 ng/mL (ref >5.9)

## 2014-09-15 LAB — FERRITIN: Ferritin: 105 ng/mL (ref 24–336)

## 2014-09-15 LAB — TYPE AND SCREEN
ABO/RH(D): O POS
Antibody Screen: NEGATIVE

## 2014-09-15 LAB — VITAMIN B12: VITAMIN B 12: 462 pg/mL (ref 180–914)

## 2014-09-15 LAB — MAGNESIUM: Magnesium: 1.9 mg/dL (ref 1.7–2.4)

## 2014-09-15 LAB — APTT: aPTT: 25 seconds (ref 24–37)

## 2014-09-15 LAB — IRON AND TIBC
IRON: 71 ug/dL (ref 45–182)
Saturation Ratios: 39 % (ref 17.9–39.5)
TIBC: 181 ug/dL — ABNORMAL LOW (ref 250–450)
UIBC: 110 ug/dL

## 2014-09-15 LAB — HEMOGLOBIN A1C
HEMOGLOBIN A1C: 13.5 % — AB (ref 4.8–5.6)
Mean Plasma Glucose: 341 mg/dL

## 2014-09-15 LAB — TECHNOLOGIST SMEAR REVIEW

## 2014-09-15 MED ORDER — HYDROCHLOROTHIAZIDE 12.5 MG PO CAPS
12.5000 mg | ORAL_CAPSULE | Freq: Every day | ORAL | Status: DC
  Administered 2014-09-15: 12.5 mg via ORAL
  Filled 2014-09-15 (×2): qty 1

## 2014-09-15 MED ORDER — INSULIN ASPART 100 UNIT/ML ~~LOC~~ SOLN
0.0000 [IU] | Freq: Three times a day (TID) | SUBCUTANEOUS | Status: DC
  Administered 2014-09-15: 7 [IU] via SUBCUTANEOUS
  Administered 2014-09-16: 2 [IU] via SUBCUTANEOUS

## 2014-09-15 MED ORDER — GLUCERNA SHAKE PO LIQD
237.0000 mL | Freq: Two times a day (BID) | ORAL | Status: DC
  Administered 2014-09-15 (×2): 237 mL via ORAL

## 2014-09-15 MED ORDER — INSULIN ASPART 100 UNIT/ML ~~LOC~~ SOLN
0.0000 [IU] | Freq: Every day | SUBCUTANEOUS | Status: DC
  Administered 2014-09-15: 4 [IU] via SUBCUTANEOUS

## 2014-09-15 MED ORDER — AMLODIPINE BESYLATE 5 MG PO TABS
5.0000 mg | ORAL_TABLET | Freq: Every day | ORAL | Status: DC
  Administered 2014-09-15: 5 mg via ORAL
  Filled 2014-09-15 (×2): qty 1

## 2014-09-15 MED ORDER — ZOLPIDEM TARTRATE 5 MG PO TABS
5.0000 mg | ORAL_TABLET | Freq: Every evening | ORAL | Status: DC | PRN
  Administered 2014-09-15: 5 mg via ORAL
  Filled 2014-09-15: qty 1

## 2014-09-15 MED ORDER — ENSURE ENLIVE PO LIQD: 237.0000 mL | Freq: Every day | ORAL | Status: DC

## 2014-09-15 MED ORDER — INSULIN GLARGINE 100 UNIT/ML ~~LOC~~ SOLN
44.0000 [IU] | Freq: Every day | SUBCUTANEOUS | Status: DC
  Administered 2014-09-15: 44 [IU] via SUBCUTANEOUS
  Filled 2014-09-15 (×2): qty 0.44

## 2014-09-15 MED ORDER — SODIUM CHLORIDE 0.9 % IV SOLN
INTRAVENOUS | Status: DC
  Administered 2014-09-16: 08:00:00 via INTRAVENOUS

## 2014-09-15 NOTE — Consult Note (Signed)
Subjective:   HPI  The patient is a 49 year old male with a history of diabetes mellitus. He also has a history of reflux esophagitis. He has been experiencing vomiting and hematemesis for the past 4 days characterized by coffee-ground-appearing material. He states he can't keep anything down and keeps vomiting. He has heartburn. He has been taking pantoprazole at home.  Review of Systems As above  Past Medical History  Diagnosis Date  . Onychomycosis   . Hypertension   . Diabetic peripheral neuropathy   . Diabetic foot ulcer     s/p Right first ray amputation, left transmetatarsal amputation with revision  . History of drug abuse     Cocaine and marijuana  . Exposure to trichomonas     treated empirically  . Cellulitis of left foot 03/2008    left 4th and 5th metatarsal area  . Hyperlipidemia   . 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 Ulcerative esophagitis 8299    severe, complicated with UGI bleed  . Mallory [name] tear April 2009  . History of prolonged Q-T interval on ECG   . 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  . 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 CKD (chronic kidney disease) stage 2, GFR 60-89 ml/min     BL SCr 1.3-1.4  . Depression   . Peripheral vascular disease   . Type 2 diabetes mellitus, uncontrolled, with renal complications   . GERD (gastroesophageal reflux disease)   . 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 Cellulitis and abscess of leg 01/31/2014  . Osteomyelitis of left leg 01/21/2014  . History of left below knee amputation 01/21/2014    Performed on    . Status post below knee  amputation of right lower extremity 03/23/2014   Past Surgical History  Procedure Laterality Date  . Toe amputation  06/2011    left; 4th toe  . Nephrectomy      L side  . I&d extremity  08/24/2011    Procedure: IRRIGATION AND DEBRIDEMENT EXTREMITY;  Surgeon: Meredith Pel, MD;  Location: [place];  Service: Orthopedics;  Laterality: Left;  . Amputation  08/24/2011    Procedure: AMPUTATION RAY;  Surgeon: Meredith Pel, MD;  Location: [place];  Service: Orthopedics;  Laterality: Left;  left fourth toe Ray Resection  . Amputation  12/05/2011    Procedure: AMPUTATION RAY;  Surgeon: Meredith Pel, MD;  Location: [place];  Service: Orthopedics;  Laterality: Left;  . I&d extremity  02/18/2012    Procedure: IRRIGATION AND DEBRIDEMENT EXTREMITY;  Surgeon: Mcarthur Rossetti, MD;  Location: [place];  Service: Orthopedics;  Laterality: Left;  . Amputation  02/18/2012    Procedure: AMPUTATION DIGIT;  Surgeon: Mcarthur Rossetti, MD;  Location: [place];  Service: Orthopedics;  Laterality: Left;  revison transmetatarsal  . I&d extremity Left 08/28/2012    Procedure: IRRIGATION AND DEBRIDEMENT EXTREMITY;  Surgeon: Linna Hoff, MD;  Location: [place];  Service: Orthopedics;  Laterality: Left;  CYSTO TUBING/IRRIGATION, LEAD HAND.  . I&d extremity Left 08/30/2012    Procedure: IRRIGATION AND DEBRIDEMENT EXTREMITY;  Surgeon: Linna Hoff, MD;  Location: [place];  Service: Orthopedics;  Laterality: Left;  . Amputation Right 09/01/2012    Procedure: FOOT 1ST RAY AMPUTATION;  Surgeon: Newt Minion, MD;  Location: [place];  Service: Orthopedics;  Laterality: Right;  . Amputation Left 09/01/2012    Procedure: FOOT REVISION TRANSMETATARSAL AMPUTATION ;  Surgeon: Newt Minion, MD;  Location: [place];  Service: Orthopedics;  Laterality: Left;  . Amputation Right 09/13/2012    Procedure: AMPUTATION BELOW KNEE;  Surgeon: Newt Minion, MD;  Location: [place];  Service: Orthopedics;  Laterality: Right;  Right Below  Knee Amputation  . Amputation Left 12/12/2013    Procedure: Left Below Knee Amputation;  Surgeon: Newt Minion, MD;  Location: [place];  Service: Orthopedics;  Laterality: Left;  . Stump revision Left 01/23/2014    Procedure: STUMP REVISION;  Surgeon: Newt Minion, MD;  Location: [place];  Service: Orthopedics;  Laterality: Left;   History   Social History  . Marital Status: Married    Spouse Name: N/A  . Number of Children: N/A  . Years of Education: 12   Occupational History  . disabled    Social History Main Topics  . Smoking status: Current Every Day Smoker -- 0.50 packs/day for 34 years    Types: Cigarettes  . Smokeless tobacco: Never Used     Comment: 1/2 PPD  . Alcohol Use: 0.0 oz/week    0 Standard drinks or equivalent per week     Comment: occ  . Drug Use: No     Comment: NO LONGER USES-GRADUATING FROM GEO CRE AFTER  3-4 MONTHS                                                                                                                                       . Sexual Activity: Not on file   Other Topics Concern  . Not on file   Social History Narrative   family history includes Diabetes type II in his mother; Pancreatic cancer in his father.  Current facility-administered medications:  .  amLODipine (NORVASC) tablet 5 mg, 5 mg, Oral, Daily, Tasrif Ahmed, MD, 5 mg at 09/15/14 1100 .  ferrous sulfate tablet 325 mg, 325 mg, Oral, TID WC, Marjan Rabbani, MD, 325 mg at 09/15/14 0825 .  folic acid (FOLVITE) tablet 1 mg, 1 mg, Oral, Daily, Tasrif Ahmed, MD, 1 mg at 09/15/14 1015 .  gabapentin (NEURONTIN) tablet 600 mg, 600 mg, Oral, TID, Ejiroghene E Emokpae, MD, 600 mg at 09/15/14 1015 .  heparin injection 5,000 Units, 5,000 Units, Subcutaneous, 3 times per day, Bethena Roys, MD, 5,000 Units at 09/15/14 0630 .  insulin aspart (novoLOG) injection 0-9 Units, 0-9 Units, Subcutaneous, TID WC, Aldine Contes, MD, 9 Units at 09/15/14 0825 .  insulin glargine (LANTUS)  injection 44 Units, 44 Units, Subcutaneous, QHS, Tasrif Ahmed, MD .  nicotine (  NICODERM CQ - dosed in mg/24 hours) patch 21 mg, 21 mg, Transdermal, Daily, Ejiroghene E Emokpae, MD, 21 mg at 09/15/14 1015 .  ondansetron (ZOFRAN) injection 4 mg, 4 mg, Intravenous, Q8H PRN, Bethena Roys, MD, 4 mg at 09/14/14 2116 .  pantoprazole (PROTONIX) EC tablet 40 mg, 40 mg, Oral, BID, Marjan Rabbani, MD, 40 mg at 09/15/14 1015 .  sucralfate (CARAFATE) 1 GM/10ML suspension 1 g, 1 g, Oral, TID WC & HS, Marjan Rabbani, MD, 1 g at 09/15/14 0825 No Known Allergies   Objective:     BP 172/90 mmHg  Pulse 108  Temp(Src) 98.7 F (37.1 C) (Oral)  Resp 17  Ht 5\' 9"  (1.753 m)  Wt 89.3 kg (196 lb 13.9 oz)  BMI 29.06 kg/m2  SpO2 99%  He is in no distress  Heart regular rhythm no murmurs  Lungs clear  Abdomen: Bowel sounds normal, soft, nontender  Laboratory No components found for: D1    Assessment:     GERD  Coffee-ground emesis      Plan:     Continue PPI therapy. We will proceed with EGD tomorrow to evaluate the upper GI tract. Lab Results  Component Value Date   HGB 8.0* 09/15/2014   HGB 9.0* 09/14/2014   HGB 13.3 09/13/2014   HCT 23.3* 09/15/2014   HCT 25.5* 09/14/2014   HCT 39.0 09/13/2014   ALKPHOS 125 09/13/2014   ALKPHOS 125* 01/21/2014   ALKPHOS 99 12/12/2013   AST 26 09/13/2014   AST 12 01/21/2014   AST 12 12/12/2013   ALT 26 09/13/2014   ALT 10 01/21/2014   ALT 7 12/12/2013   AMYLASE 124* 02/18/2012

## 2014-09-15 NOTE — Progress Notes (Signed)
Initial Nutrition Assessment  DOCUMENTATION CODES:  Not applicable  INTERVENTION:  Ensure Enlive (each supplement provides 350kcal and 20 grams of protein), Glucerna shake   Encourage adequate PO intake.   NUTRITION DIAGNOSIS:  Inadequate oral intake related to nausea, vomiting as evidenced by meal completion < 50%.  GOAL:  Patient will meet greater than or equal to 90% of their needs  MONITOR:  PO intake, Supplement acceptance, Weight trends, Labs, I & O's  REASON FOR ASSESSMENT:  Consult Poor PO  ASSESSMENT: Pt with PMH of uncontrolled DM, HTN, CKD, GERD who p/w n/v, abd pain and elevated blood glucose levels. Pt admits to medication non compliance and poor diet. Pt with bilateral BKA.  RD contacted via RN as pt with poor po intake. Pt reports having a lack of appetite due to nausea and vomiting. Meal completion has been 0-50%. Pt reports PTA po intake is very varied due to financial difficulties. He reports in the beginning of the months he usually eats well. Pt reports usual body weight of ~215 lbs. Noted per Epic weight records, pt with a 8.6% weight loss in 2 months.  Pt is agreeable to nutritional supplements to aid in caloric and protein needs. RD to order Ensure and Glucerna Shake. Pt was encouraged to eat his food at meals. Plans for EGD tomorrow to evaluate the upper GI tract.  Nutrition-Focused physical exam completed. Findings are no fat depletion, mild-moderate muscle depletion (thigh-quadricep muscle), and no edema.   Labs: Low calcium, and GFR. High BUN and creatinine.  CBG's 246-353 ml/dL. Height:  Ht Readings from Last 1 Encounters:  09/14/14 5\' 9"  (1.753 m)    Weight:  Wt Readings from Last 1 Encounters:  09/14/14 196 lb 13.9 oz (89.3 kg)    Ideal Body Weight:  63 kg (adjusted for bilateral BKA)  Wt Readings from Last 10 Encounters:  09/14/14 196 lb 13.9 oz (89.3 kg)  07/27/14 230 lb 12.8 oz (104.69 kg)  06/16/14 230 lb 9.6 oz (104.599 kg)   01/31/14 205 lb 0.4 oz (93 kg)  01/24/14 216 lb 14.9 oz (98.4 kg)  12/12/13 228 lb 6.3 oz (103.6 kg)  11/03/13 228 lb 4.8 oz (103.556 kg)  10/16/13 223 lb 3.2 oz (101.243 kg)  07/22/13 213 lb 8 oz (96.843 kg)  01/20/13 199 lb 4.8 oz (90.402 kg)    BMI:  Body mass index is 29.06 kg/(m^2).  Estimated Nutritional Needs:  Kcal:  1900-2100  Protein:  100-115 grams  Fluid:  1.9 - 2.1 L/day  Skin:  Reviewed, no issues  Diet Order:  Diet Carb Modified Fluid consistency:: Thin; Room service appropriate?: Yes Diet NPO time specified  EDUCATION NEEDS:  No education needs identified at this time   Intake/Output Summary (Last 24 hours) at 09/15/14 1558 Last data filed at 09/15/14 1349  Gross per 24 hour  Intake   2645 ml  Output   2800 ml  Net   -155 ml    Last BM:  6/6  Kallie Locks, MS, RD, LDN Pager # [phone] After hours/ weekend pager # [phone]

## 2014-09-15 NOTE — Progress Notes (Signed)
Patient self administering injections with supervision by the nurse, per patient's request.

## 2014-09-15 NOTE — Progress Notes (Signed)
Subjective:  Was able to tolerate clear liquid. Does not like the taste of the foods we have offered (brothe). Is concerned about his ongoing GERD symptoms. Coffee ground emesis did stop yesterday. No longer nauseous. On protonix BID and carafate with some improvement of epigastric burning/pain.   Patient and wife very concerned about his home situation. Lack of access to food and water.    Objective: Vital signs in last 24 hours: Filed Vitals:   09/14/14 2028 09/15/14 0523 09/15/14 0758 09/15/14 0947  BP: 153/71 177/93 174/88 172/90  Pulse: 119 108 108   Temp: 98.9 F (37.2 C) 99.1 F (37.3 C) 98.7 F (37.1 C)   TempSrc: Oral Oral Oral   Resp: 18 17 17    Height:      Weight: 196 lb 13.9 oz (89.3 kg)     SpO2: 95% 100% 99%    Weight change: -13 lb 2.1 oz (-5.955 kg)  Intake/Output Summary (Last 24 hours) at 09/15/14 1344 Last data filed at 09/15/14 1227  Gross per 24 hour  Intake   2285 ml  Output   2800 ml  Net   -515 ml   Constitutional: He appears well-developed. In no acute distress.  Eyes: perrla, eomi Cardiovascular: Regular rhythm, no m/r/g Pulmonary/Chest: Effort normal and breath sounds normal. He has no wheezes.  Abdominal: Soft. Bowel sounds are normal. No tenderness to palpation. There is no rebound.  Musculoskeletal: b/l BKA Neurological: a&ox3. CN II-XII intact. Skin: Skin is warm and dry.   Lab Results: Basic Metabolic Panel:  Recent Labs Lab 09/14/14 1122 09/15/14 0530  NA 138 135  K 4.2 4.3  CL 106 106  CO2 24 22  GLUCOSE 105* 308*  BUN 62* 35*  CREATININE 1.92* 1.79*  CALCIUM 8.7* 8.2*  MG  --  1.9   Liver Function Tests:  Recent Labs Lab 09/13/14 2231  AST 26  ALT 26  ALKPHOS 125  BILITOT 0.5  PROT 7.1  ALBUMIN 3.5    Recent Labs Lab 09/13/14 2231  LIPASE 20*   No results for input(s): AMMONIA in the last 168 hours. CBC:  Recent Labs Lab 09/13/14 1840  09/15/14 0530 09/15/14 1200  WBC 18.7*  < > 14.3* 13.5*    NEUTROABS 16.9*  --   --   --   HGB 11.0*  < > 8.0* 8.1*  HCT 35.6*  < > 23.3* 23.9*  MCV 94.4  < > 86.0 86.3  PLT 467*  < > 364 338  < > = values in this interval not displayed. Cardiac Enzymes:  Recent Labs Lab 09/13/14 2230  TROPONINI <0.03   BNP: No results for input(s): PROBNP in the last 168 hours. D-Dimer: No results for input(s): DDIMER in the last 168 hours. CBG:  Recent Labs Lab 09/14/14 1041 09/14/14 1135 09/14/14 1647 09/14/14 2024 09/15/14 0731 09/15/14 1115  GLUCAP 151* 152* 272* 246* 353* 267*  Urine Drug Screen: Drugs of Abuse   Alcohol Level:  Recent Labs Lab 09/14/14 0319  ETH <5   Urinalysis:  Recent Labs Lab 09/13/14 1840  COLORURINE YELLOW  LABSPEC 1.027  PHURINE 5.5  GLUCOSEU >1000*  HGBUR NEGATIVE  BILIRUBINUR NEGATIVE  KETONESUR NEGATIVE  PROTEINUR NEGATIVE  UROBILINOGEN 0.2  NITRITE NEGATIVE  LEUKOCYTESUR NEGATIVE   Misc. Labs:  Micro Results: No results found for this or any previous visit (from the past 240 hour(s)). Studies/Results: Dg Chest Portable 1 View  09/13/2014   CLINICAL DATA:  Hyperglycemia  EXAM: PORTABLE CHEST -  1 VIEW  COMPARISON:  12/12/2013  FINDINGS: The heart size and mediastinal contours are within normal limits. Both lungs are clear. The visualized skeletal structures are unremarkable.  IMPRESSION: Normal chest x-ray.   Electronically Signed   By: Marijo Sanes M.D.   On: 09/13/2014 19:04   Medications: I have reviewed the patient's current medications. Scheduled Meds: . amLODipine  5 mg Oral Daily  . ferrous sulfate  325 mg Oral TID WC  . folic acid  1 mg Oral Daily  . gabapentin  600 mg Oral TID  . heparin  5,000 Units Subcutaneous 3 times per day  . insulin aspart  0-9 Units Subcutaneous TID WC  . insulin glargine  44 Units Subcutaneous QHS  . nicotine  21 mg Transdermal Daily  . pantoprazole  40 mg Oral BID  . sucralfate  1 g Oral TID WC & HS   Continuous Infusions:   PRN  Meds:.ondansetron (ZOFRAN) IV Assessment/Plan: Active Problems:   Type 2 diabetes mellitus with hyperosmolarity without nonketotic hyperglycemic-hyperosmolar coma (nkhhc)   Uncontrolled type 2 DM with hyperosmolar nonketotic hyperglycemia  49 yo male with Hx uncontrolled DM, esophagitis, here with HHS and UGI bleed.   Coffee ground hematemsis -with hx of GERD w/ esophagitis, hx of mallory-weiss tear, and alcoholic pancreatitis-- nausea likely 2/2 to HHS and blood due likely due to esophagitis. I don't see any EGD in the chart but by report it was done on 2008 by Dr. Watt Climes [facility]) and showed distal esophageal ulcerations and esophagitis. Currently has bloated sensation. burning stomach pain.  BUN was also high initially in 70's. Now improved (could be either UGI causing BUN that high in combination with AKI).  - continue home protonix (increased to BID as was having GERD symptoms), added carafate.  - monitor CBC closely. hgb last 11.0 (higher than his baseline about 9-10, has been low in 7-8's as well in the past). Today hgb 8 and repeat 8.1 - typed and crossed. - consulted GI: will have EGD tomorrow.  - cont zofran prn  Uncontrolled DM II - presented with Hyperosmolar hyperglycemic state-- Improved with insulin drip.  Hx of DM II last hgba1c 9.6 on 05/2014. Repeat was 13.5.  Home regimen lantus 44 units + novolog 1-10 units SSI TID.  Presented glucose 1596 on admission, neg for ketones, ikely HHS 2/2 to medication non compliance.  Transitioned to 20 lantus 6/6 and SSI, started diet. CBG overnight has been in high 200-300's.  - today switched to 44 lantus. Cont SSI.  - consulted to care management for help with meds. Has poor home situation (lacks access to food and water). States that he recently got disability benefit and that should help.   Acute on CKD 2-- has left nephrectomy due to chronic pyelonephritis. b/l Cr around 1.4. Cr on admission 2.57. Now down to 1.7 Likely pre-renal.  BUN/Cr ratio>20.  - d/c fluid if taking PO intake.  - follow BMET  Chronic normocytic anemia - baseline has been as low as 7-8, but mainly 9-10's. Last hgb 11. Last anemia panel showed low iron and low normal ferritin. Low folate. On repeat, folate is normal along with normal iron and ferritin. - cont PO iron and started PO folic acid daily. - monitor hgb.   HTN--BP up in 170's this am.  -hold home lisinopril 40mg  qd as still has AKI and HCTZ for volume depletion on presentation - started amlodipine 5mg  daily.  Hyperkalemia-- resolved. initially K+ 6.3 w/ peaked T waves  on EKG. Given calcium gluconate x 1 and it normalized.  Phantom limb pain - cont home neurontin 600mg  TID  Depression - cont home prozac 10mg   Tobacco abuse-- smokes 1/2 PPD since he was 49 y/o - nicotine 21 mg patch  FEN:  - diet - clear - advance as tolerates.  DVT ppx-- heparin  Dispo: Disposition is deferred at this time, awaiting improvement of current medical problems.   The patient does have a current PCP [name], MD) and does need an [facility] hospital follow-up appointment after discharge.  The patient does not have transportation limitations that hinder transportation to clinic appointments.  .Services Needed at time of discharge: Y = Yes, Blank = No PT:   OT:   RN:   Equipment:   Other:     LOS: 1 day   Dellia Nims, MD 09/15/2014, 1:44 PM

## 2014-09-15 NOTE — Progress Notes (Signed)
Utilization review completed.  L J Jilliann Subramanian RN, BSN, CM  [phone] 

## 2014-09-15 NOTE — Progress Notes (Signed)
Inpatient Diabetes Program Recommendations  AACE/ADA: New Consensus Statement on Inpatient Glycemic Control (2013)  Target Ranges:  Prepandial:   less than 140 mg/dL      Peak postprandial:   less than 180 mg/dL (1-2 hours)      Critically ill patients:  140 - 180 mg/dL    Inpatient Diabetes Program Recommendations Insulin - Basal: Noted pt received lantus 20 units during transition off insulin drip. Pt ordered 44 units lantus to be started at HS tonight. Glucose should improve, but may need HS correction tonight to start normalizing the glucose before the HSy dose of 44 units lantus is started..    Thank you Rosita Kea, RN, MSN, CDE  Diabetes Inpatient Program Office: [phone] Pager: [phone] 8:00 am to 5:00 pm

## 2014-09-16 ENCOUNTER — Inpatient Hospital Stay (HOSPITAL_COMMUNITY): Payer: Medicaid Other | Admitting: Anesthesiology

## 2014-09-16 ENCOUNTER — Ambulatory Visit: Payer: Medicaid Other | Admitting: Physical Therapy

## 2014-09-16 ENCOUNTER — Encounter (HOSPITAL_COMMUNITY)
Admission: EM | Disposition: A | Discharge status: Home / Self Care | Payer: Self-pay | Source: Home / Self Care | Attending: Internal Medicine

## 2014-09-16 ENCOUNTER — Encounter (HOSPITAL_COMMUNITY): Payer: Self-pay

## 2014-09-16 DIAGNOSIS — K219 Gastro-esophageal reflux disease without esophagitis: Secondary | ICD-10-CM

## 2014-09-16 DIAGNOSIS — K2210 Ulcer of esophagus without bleeding: Secondary | ICD-10-CM | POA: Diagnosis present

## 2014-09-16 DIAGNOSIS — I10 Essential (primary) hypertension: Secondary | ICD-10-CM

## 2014-09-16 HISTORY — PX: ESOPHAGOGASTRODUODENOSCOPY (EGD) WITH PROPOFOL: SHX5813

## 2014-09-16 LAB — CBC
HEMATOCRIT: 24.5 % — AB (ref 39.0–52.0)
Hemoglobin: 8.1 g/dL — ABNORMAL LOW (ref 13.0–17.0)
MCH: 28.7 pg (ref 26.0–34.0)
MCHC: 33.1 g/dL (ref 30.0–36.0)
MCV: 86.9 fL (ref 78.0–100.0)
Platelets: 369 10*3/uL (ref 150–400)
RBC: 2.82 MIL/uL — AB (ref 4.22–5.81)
RDW: 12.3 % (ref 11.5–15.5)
WBC: 10.2 10*3/uL (ref 4.0–10.5)

## 2014-09-16 LAB — BASIC METABOLIC PANEL
ANION GAP: 10 (ref 5–15)
BUN: 20 mg/dL (ref 6–20)
CO2: 24 mmol/L (ref 22–32)
CREATININE: 1.65 mg/dL — AB (ref 0.61–1.24)
Calcium: 8.7 mg/dL — ABNORMAL LOW (ref 8.9–10.3)
Chloride: 102 mmol/L (ref 101–111)
GFR calc Af Amer: 55 mL/min — ABNORMAL LOW (ref >60)
GFR, EST NON AFRICAN AMERICAN: 47 mL/min — AB (ref >60)
Glucose, Bld: 233 mg/dL — ABNORMAL HIGH (ref 65–99)
POTASSIUM: 4 mmol/L (ref 3.5–5.1)
Sodium: 136 mmol/L (ref 135–145)

## 2014-09-16 LAB — GLUCOSE, CAPILLARY
GLUCOSE-CAPILLARY: 180 mg/dL — AB (ref 65–99)
GLUCOSE-CAPILLARY: 185 mg/dL — AB (ref 65–99)
Glucose-Capillary: 206 mg/dL — ABNORMAL HIGH (ref 65–99)

## 2014-09-16 SURGERY — ESOPHAGOGASTRODUODENOSCOPY (EGD) WITH PROPOFOL
Anesthesia: Monitor Anesthesia Care

## 2014-09-16 MED ORDER — AMLODIPINE BESYLATE 5 MG PO TABS: 5.0000 mg | ORAL_TABLET | Freq: Every day | ORAL | Status: DC

## 2014-09-16 MED ORDER — PROPOFOL INFUSION 10 MG/ML OPTIME
INTRAVENOUS | Status: DC | PRN
  Administered 2014-09-16: 75 ug/kg/min via INTRAVENOUS

## 2014-09-16 MED ORDER — BUTAMBEN-TETRACAINE-BENZOCAINE 2-2-14 % EX AERO
INHALATION_SPRAY | CUTANEOUS | Status: DC | PRN
  Administered 2014-09-16: 1 via TOPICAL

## 2014-09-16 MED ORDER — INSULIN GLARGINE 100 UNIT/ML ~~LOC~~ SOLN: 44.0000 [IU] | Freq: Every morning | SUBCUTANEOUS | Status: DC

## 2014-09-16 MED ORDER — MIDAZOLAM HCL 5 MG/5ML IJ SOLN
INTRAMUSCULAR | Status: DC | PRN
  Administered 2014-09-16: 2 mg via INTRAVENOUS

## 2014-09-16 MED ORDER — LACTATED RINGERS IV SOLN
INTRAVENOUS | Status: DC | PRN
  Administered 2014-09-16: 10:00:00 via INTRAVENOUS

## 2014-09-16 MED ORDER — INSULIN ASPART 100 UNIT/ML ~~LOC~~ SOLN: 5.0000 [IU] | Freq: Three times a day (TID) | SUBCUTANEOUS | Status: DC

## 2014-09-16 MED ORDER — LIDOCAINE HCL (CARDIAC) 20 MG/ML IV SOLN
INTRAVENOUS | Status: DC | PRN
  Administered 2014-09-16: 60 mg via INTRAVENOUS

## 2014-09-16 MED ORDER — SUCRALFATE 1 GM/10ML PO SUSP: 1.0000 g | Freq: Three times a day (TID) | ORAL | Status: DC

## 2014-09-16 MED ORDER — GLUCERNA SHAKE PO LIQD: 237.0000 mL | Freq: Two times a day (BID) | ORAL | Status: DC

## 2014-09-16 MED ORDER — PROPOFOL 10 MG/ML IV BOLUS
INTRAVENOUS | Status: DC | PRN
  Administered 2014-09-16 (×2): 20 mg via INTRAVENOUS

## 2014-09-16 MED ORDER — PANTOPRAZOLE SODIUM 40 MG PO TBEC: 40.0000 mg | DELAYED_RELEASE_TABLET | Freq: Two times a day (BID) | ORAL | Status: DC

## 2014-09-16 MED ORDER — FOLIC ACID 1 MG PO TABS: 1.0000 mg | ORAL_TABLET | Freq: Every day | ORAL | Status: DC

## 2014-09-16 NOTE — Discharge Instructions (Signed)
Start taking lantus 44 units Start taking novolog 5 units with each meal.  Start protonix twice a day and continue carafate.

## 2014-09-16 NOTE — Anesthesia Postprocedure Evaluation (Signed)
  Anesthesia Post-op Note  Patient: [name]  Procedure(s) Performed: Procedure(s): ESOPHAGOGASTRODUODENOSCOPY (EGD) WITH PROPOFOL (N/A)  Patient Location: Endoscopy Unit  Anesthesia Type:MAC  Level of Consciousness: awake, alert , oriented and patient cooperative  Airway and Oxygen Therapy: Patient Spontanous Breathing  Post-op Pain: mild  Post-op Assessment: Post-op Vital signs reviewed, Patient's Cardiovascular Status Stable, Respiratory Function Stable, Patent Airway, No signs of Nausea or vomiting, Adequate PO intake, Pain level controlled, No headache and No backache              Post-op Vital Signs: Reviewed and stable  Last Vitals:  Filed Vitals:   09/16/14 1030  BP: 124/81  Pulse: 101  Temp:   Resp: 20    Complications: No apparent anesthesia complications

## 2014-09-16 NOTE — Transfer of Care (Signed)
Immediate Anesthesia Transfer of Care Note  Patient: [name]  Procedure(s) Performed: Procedure(s): ESOPHAGOGASTRODUODENOSCOPY (EGD) WITH PROPOFOL (N/A)  Patient Location: Endoscopy Unit  Anesthesia Type:MAC  Level of Consciousness: awake, alert , oriented and patient cooperative  Airway & Oxygen Therapy: Patient Spontanous Breathing and Patient connected to face mask oxygen  Post-op Assessment: Report given to RN and Post -op Vital signs reviewed and stable  Post vital signs: Reviewed and stable  Last Vitals:  Filed Vitals:   09/16/14 0900  BP: 181/93  Pulse: 106  Temp: 37.1 C  Resp: 18    Complications: No apparent anesthesia complications

## 2014-09-16 NOTE — Anesthesia Preprocedure Evaluation (Signed)
Anesthesia Evaluation  Patient identified by MRN, date of birth, ID band Patient awake    Reviewed: Allergy & Precautions, H&P , NPO status , Patient's Chart, lab work & pertinent test results  Airway Mallampati: II   Neck ROM: full    Dental   Pulmonary Current Smoker,    + decreased breath sounds      Cardiovascular hypertension, + Peripheral Vascular Disease + dysrhythmias Rhythm:Regular     Neuro/Psych PSYCHIATRIC DISORDERS Depression  Neuromuscular disease    GI/Hepatic PUD, GERD-  ,(+)     substance abuse  alcohol use,   Endo/Other  diabetes, Type 2, Insulin Dependentobese  Renal/GU Renal InsufficiencyRenal disease     Musculoskeletal   Abdominal   Peds  Hematology  (+) anemia ,   Anesthesia Other Findings   Reproductive/Obstetrics                             Anesthesia Physical  Anesthesia Plan  ASA: III  Anesthesia Plan: MAC   Post-op Pain Management:    Induction: Intravenous  Airway Management Planned:   Additional Equipment:   Intra-op Plan:   Post-operative Plan:   Informed Consent: I have reviewed the patients History and Physical, chart, labs and discussed the procedure including the risks, benefits and alternatives for the proposed anesthesia with the patient or authorized representative who has indicated his/her understanding and acceptance.   Dental advisory given  Plan Discussed with: CRNA  Anesthesia Plan Comments:         Anesthesia Quick Evaluation

## 2014-09-16 NOTE — Progress Notes (Signed)
[name] to be D/C'd Home per MD order.  Discussed prescriptions and follow up appointments with the patient. Prescriptions given to patient, medication list explained in detail. Pt verbalized understanding.    Medication List    STOP taking these medications        hydrochlorothiazide 12.5 MG capsule  Commonly known as:  MICROZIDE     lisinopril 40 MG tablet  Commonly known as:  PRINIVIL,ZESTRIL      TAKE these medications        ACCU-CHEK FASTCLIX LANCETS Misc  1 each by Does not apply route 4 (four) times daily -  before meals and at bedtime.     acetaminophen-codeine 300-30 MG per tablet  Commonly known as:  TYLENOL #3  Take 1-2 tablets by mouth every 4 (four) hours as needed for moderate pain.     amLODipine 5 MG tablet  Commonly known as:  NORVASC  Take 1 tablet (5 mg total) by mouth daily.     feeding supplement (GLUCERNA SHAKE) Liqd  Take 237 mLs by mouth 2 (two) times daily between meals.     ferrous sulfate 325 (65 FE) MG tablet  Take 1 tablet (325 mg total) by mouth 3 (three) times daily with meals.     folic acid 1 MG tablet  Commonly known as:  FOLVITE  Take 1 tablet (1 mg total) by mouth daily.     gabapentin 600 MG tablet  Commonly known as:  NEURONTIN  Take 1 tablet (600 mg total) by mouth 3 (three) times daily.     glucose blood test strip  Commonly known as:  ACCU-CHEK SMARTVIEW  1 each by Other route 4 (four) times daily -  before meals and at bedtime. Use as instructed     insulin aspart 100 UNIT/ML injection  Commonly known as:  novoLOG  Inject 5 Units into the skin 3 (three) times daily with meals.     insulin glargine 100 UNIT/ML injection  Commonly known as:  LANTUS  Inject 0.44 mLs (44 Units total) into the skin every morning.     nicotine 14 mg/24hr patch  Commonly known as:  NICODERM CQ - dosed in mg/24 hours  Place 1 patch onto the skin daily.     pantoprazole 40 MG tablet  Commonly known as:  PROTONIX  Take 1 tablet (40 mg  total) by mouth 2 (two) times daily.     polyethylene glycol packet  Commonly known as:  MIRALAX / GLYCOLAX  Take 17 g by mouth daily.     sildenafil 20 MG tablet  Commonly known as:  REVATIO  Take 2-5 tablets before sexual activity.     sucralfate 1 GM/10ML suspension  Commonly known as:  CARAFATE  Take 10 mLs (1 g total) by mouth 4 (four) times daily -  with meals and at bedtime.        Filed Vitals:   09/16/14 1040  BP: 148/94  Pulse: 101  Temp:   Resp: 24    Skin clean, dry and intact without evidence of skin break down, no evidence of skin tears noted. IV catheter discontinued intact. Site without signs and symptoms of complications. Dressing and pressure applied. Pt denies pain at this time. No complaints noted.  An After Visit Summary was printed and given to the patient. Patient escorted via [place], and D/C home via private auto.  [name] C 09/16/2014 2:03 PM

## 2014-09-16 NOTE — Progress Notes (Signed)
Subjective:  Feels better today. Abdominal pain is better. Was able to tolerate food. Was going for EGD as we were seeing him.    Objective: Vital signs in last 24 hours: Filed Vitals:   09/16/14 0900 09/16/14 1030 09/16/14 1035 09/16/14 1040  BP: 181/93 124/81  148/94  Pulse: 106 101 104 101  Temp: 98.8 F (37.1 C)     TempSrc: Oral     Resp: 18 20 19 24   Height:      Weight:      SpO2: 100% 100% 100% 98%   Weight change: -13.9 oz (-0.395 kg)  Intake/Output Summary (Last 24 hours) at 09/16/14 1152 Last data filed at 09/16/14 1031  Gross per 24 hour  Intake   1460 ml  Output   2500 ml  Net  -1040 ml   Constitutional: He appears well-developed. In no acute distress.  Eyes: perrla, eomi Cardiovascular: Regular rhythm, no m/r/g Pulmonary/Chest: Effort normal and breath sounds normal. He has no wheezes.  Abdominal: Soft. Bowel sounds are normal. No tenderness to palpation. There is no rebound.  Musculoskeletal: b/l BKA Neurological: a&ox3. CN II-XII intact. Skin: Skin is warm and dry.   Lab Results: Basic Metabolic Panel:  Recent Labs Lab 09/15/14 0530 09/16/14 0530  NA 135 136  K 4.3 4.0  CL 106 102  CO2 22 24  GLUCOSE 308* 233*  BUN 35* 20  CREATININE 1.79* 1.65*  CALCIUM 8.2* 8.7*  MG 1.9  --    Liver Function Tests:  Recent Labs Lab 09/13/14 2231  AST 26  ALT 26  ALKPHOS 125  BILITOT 0.5  PROT 7.1  ALBUMIN 3.5    Recent Labs Lab 09/13/14 2231  LIPASE 20*   No results for input(s): AMMONIA in the last 168 hours. CBC:  Recent Labs Lab 09/13/14 1840  09/15/14 1200 09/16/14 0530  WBC 18.7*  < > 13.5* 10.2  NEUTROABS 16.9*  --   --   --   HGB 11.0*  < > 8.1* 8.1*  HCT 35.6*  < > 23.9* 24.5*  MCV 94.4  < > 86.3 86.9  PLT 467*  < > 338 369  < > = values in this interval not displayed. Cardiac Enzymes:  Recent Labs Lab 09/13/14 2230  TROPONINI <0.03   BNP: No results for input(s): PROBNP in the last 168 hours. D-Dimer: No  results for input(s): DDIMER in the last 168 hours. CBG:  Recent Labs Lab 09/15/14 1115 09/15/14 1611 09/15/14 2106 09/16/14 0728 09/16/14 1035 09/16/14 1111  GLUCAP 267* 319* 306* 180* 206* 185*  Urine Drug Screen: Drugs of Abuse   Alcohol Level:  Recent Labs Lab 09/14/14 0319  ETH <5   Urinalysis:  Recent Labs Lab 09/13/14 1840  COLORURINE YELLOW  LABSPEC 1.027  PHURINE 5.5  GLUCOSEU >1000*  HGBUR NEGATIVE  BILIRUBINUR NEGATIVE  KETONESUR NEGATIVE  PROTEINUR NEGATIVE  UROBILINOGEN 0.2  NITRITE NEGATIVE  LEUKOCYTESUR NEGATIVE   Misc. Labs:  Micro Results: No results found for this or any previous visit (from the past 240 hour(s)). Studies/Results: No results found. Medications: I have reviewed the patient's current medications. Scheduled Meds: . amLODipine  5 mg Oral Daily  . feeding supplement (ENSURE ENLIVE)  237 mL Oral Q1500  . feeding supplement (GLUCERNA SHAKE)  237 mL Oral BID BM  . ferrous sulfate  325 mg Oral TID WC  . folic acid  1 mg Oral Daily  . gabapentin  600 mg Oral TID  . heparin  5,000 Units Subcutaneous 3 times per day  . insulin aspart  0-5 Units Subcutaneous QHS  . insulin aspart  0-9 Units Subcutaneous TID WC  . insulin glargine  44 Units Subcutaneous QHS  . nicotine  21 mg Transdermal Daily  . pantoprazole  40 mg Oral BID  . sucralfate  1 g Oral TID WC & HS   Continuous Infusions:   PRN Meds:.ondansetron (ZOFRAN) IV, zolpidem Assessment/Plan: Active Problems:   Type 2 diabetes mellitus with hyperosmolarity without nonketotic hyperglycemic-hyperosmolar coma (nkhhc)   Uncontrolled type 2 DM with hyperosmolar nonketotic hyperglycemia  49 yo male with Hx uncontrolled DM, esophagitis, here with HHS and UGI bleed.   Coffee ground hematemsis - resovled -with hx of GERD w/ esophagitis, hx of mallory-weiss tear, and alcoholic pancreatitis-- nausea likely 2/2 to HHS and blood due likely due to esophagitis.  EGD last on 2008 by Dr.  Watt Climes [facility]) and showed distal esophageal ulcerations and esophagitis. Had GERD symptoms but improved. Coffee ground hematemesis also resolved. BUN was also high initially in 70's now normalized.  Consulted GI here, had EGD this morning and showed distal erosive esophagitis and small hiatal hernia. recommended Protonix 40 mg BID.  - continue protonix BID per gi rec, also continue carafate temporarily as he had improvement with it.   - hgb stable. This should be followed on follow up.   Uncontrolled DM II - presented with Hyperosmolar hyperglycemic state-- Improved with insulin drip.  Hx of DM II last hgba1c 9.6 on 05/2014. Repeat was 13.5.  Home regimen lantus 44 units + novolog 1-10 units SSI TID.  Presented glucose 1596 on admission, neg for ketones, ikely HHS 2/2 to medication non compliance.  Transitioned to 20 lantus 6/6 and SSI, started diet. CBG overnight has been in high 200-300's.  - consulted to care management for help with meds. Has poor home situation (lacks access to food and water). States that he recently got disability benefit and that should help.  - will do lantus 44 units + novolog 5 units TID with meals. This should be further titrated outpatient.  Acute on CKD 2--likely 2/2 to vomitting.  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 b/l Cr around 1.4. Cr on admission 2.57. Now down to 1.6.  - d/c fluid if taking PO intake.  - follow BMET outpatient.  Chronic normocytic anemia - baseline has been as low as 7-8, but mainly 9-10's. Last hgb 11. Last anemia panel showed low iron and low normal ferritin. Low folate. On repeat, folate is normal along with normal iron and ferritin. - cont PO iron and started PO folic acid daily.  HTN--BP up in 170's this am.  -hold home lisinopril 40mg  qd as still has AKI and HCTZ for volume depletion on presentation - started amlodipine 5mg  daily. F/up outpt  Hyperkalemia-- resolved. initially K+ 6.3 w/ peaked T waves on EKG. Was  Given calcium gluconate x 1 and it normalized.  Phantom limb pain - cont home neurontin 600mg  TID  Depression - cont home prozac 10mg   Tobacco abuse-- smokes 1/2 PPD since he was 49 y/o - nicotine 21 mg patch  FEN:  - diet - carb mod.  DVT ppx-- heparin  Dispo: Disposition is deferred at this time, awaiting improvement of current medical problems.   The patient does have a current PCP [name], MD) and does need an [facility] hospital follow-up appointment after discharge.  The patient does not have transportation limitations that hinder transportation to clinic appointments.  Marland Kitchen  Services Needed at time of discharge: Y = Yes, Blank = No PT:   OT:   RN:   Equipment:   Other:     LOS: 2 days   Dellia Nims, MD 09/16/2014, 11:52 AM

## 2014-09-16 NOTE — Discharge Summary (Signed)
Name: [name] MRN: [id number] DOB: [birth year] 49 y.o. PCP: [name], MD  Date of Admission: 09/13/2014  6:31 PM Date of Discharge: 09/16/2014 Attending Physician: Aldine Contes, MD  Discharge Diagnosis:  HHS Uncontrolled DM Hematemesis Esophagitis HTN  Discharge Medications:   Medication List    STOP taking these medications        hydrochlorothiazide 12.5 MG capsule  Commonly known as:  MICROZIDE     lisinopril 40 MG tablet  Commonly known as:  PRINIVIL,ZESTRIL      TAKE these medications        ACCU-CHEK FASTCLIX LANCETS Misc  1 each by Does not apply route 4 (four) times daily -  before meals and at bedtime.     acetaminophen-codeine 300-30 MG per tablet  Commonly known as:  TYLENOL #3  Take 1-2 tablets by mouth every 4 (four) hours as needed for moderate pain.     amLODipine 5 MG tablet  Commonly known as:  NORVASC  Take 1 tablet (5 mg total) by mouth daily.     feeding supplement (GLUCERNA SHAKE) Liqd  Take 237 mLs by mouth 2 (two) times daily between meals.     ferrous sulfate 325 (65 FE) MG tablet  Take 1 tablet (325 mg total) by mouth 3 (three) times daily with meals.     folic acid 1 MG tablet  Commonly known as:  FOLVITE  Take 1 tablet (1 mg total) by mouth daily.     gabapentin 600 MG tablet  Commonly known as:  NEURONTIN  Take 1 tablet (600 mg total) by mouth 3 (three) times daily.     glucose blood test strip  Commonly known as:  ACCU-CHEK SMARTVIEW  1 each by Other route 4 (four) times daily -  before meals and at bedtime. Use as instructed     insulin aspart 100 UNIT/ML injection  Commonly known as:  novoLOG  Inject 5 Units into the skin 3 (three) times daily with meals.     insulin glargine 100 UNIT/ML injection  Commonly known as:  LANTUS  Inject 0.44 mLs (44 Units total) into the skin every morning.     nicotine 14 mg/24hr patch  Commonly known as:  NICODERM CQ - dosed in mg/24 hours  Place 1 patch onto the skin  daily.     pantoprazole 40 MG tablet  Commonly known as:  PROTONIX  Take 1 tablet (40 mg total) by mouth 2 (two) times daily.     polyethylene glycol packet  Commonly known as:  MIRALAX / GLYCOLAX  Take 17 g by mouth daily.     sildenafil 20 MG tablet  Commonly known as:  REVATIO  Take 2-5 tablets before sexual activity.     sucralfate 1 GM/10ML suspension  Commonly known as:  CARAFATE  Take 10 mLs (1 g total) by mouth 4 (four) times daily -  with meals and at bedtime.        Disposition and follow-up:   [name] was discharged from [facility] in Stable condition.  At the hospital follow up visit please address:  1.  Please assess his glucose control and adjust the insulin as appropriate. Asked to take lantus 44 with novolog 5 mg TID.   Please assess for protonix compliance and assess his GERD symptoms or UGI bleed.  Follow up him BMET (had AKI) and cbc.  HTN: held home lisinopril and HCTZ as had AKI and also volume depletion. Started amlodipine  5mg  instead of those two. Reassess BMET and volume status and feel free to change it back.  2.  Labs / imaging needed at time of follow-up: bmet, cbc  3.  Pending labs/ test needing follow-up:   Follow-up Appointments: Follow-up Information    Follow up with [name], MD On 09/23/2014.   Specialty:  Internal Medicine   Why:  1:15 pm   Contact information:   [place] [address] [phone]       Discharge Instructions:   Consultations: Treatment Team:  Wonda Horner, MD  Procedures Performed:  Dg Chest Portable 1 View  09/13/2014   CLINICAL DATA:  Hyperglycemia  EXAM: PORTABLE CHEST - 1 VIEW  COMPARISON:  12/12/2013  FINDINGS: The heart size and mediastinal contours are within normal limits. Both lungs are clear. The visualized skeletal structures are unremarkable.  IMPRESSION: Normal chest x-ray.   Electronically Signed   By: Marijo Sanes M.D.   On: 09/13/2014 19:04     Admission HPI:   Pt is a 49 y/o M w/ PMHx of uncontrolled DM (HbA1c 9.6 on 05/2014), HTN, CKD, GERD, and depression who presents with elevated blood glucose. Pt states last night he checked his CBG and it was around 600, he then took lantus 25 units and 6-8 units of novolog which brought his glucose down to around 400. The next morning his CBG was around 600 and he took lantus 20 units and novolog 6 units. His glucose was still elevated after and he called EMS. Pt is suppose to be on lantus 44 units daily and novolog 1-10 units TID w/ meals. Pt admits to medication non compliance and poor diet. The day before admission pt had nausea w/ vomiting and had blood in his vomit. He has abdominal pain that is burning in nature and states he feels bloated. Passing gas and burping improves abd pain. He had a total of 40 ounces of beer yesterday. He also had burning chest pain that is located over midepigastrium last night that has since resolved. Denies recent illness, cough, SOB, diarrhea, NS, fevers, dysuria, confusion, and blurry vision. He has dizziness, sore throat, fatigue, polyuria, and polydipsia. Pt started on insulin infusion in the ED and given 1L NS bolus.   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   Hospital Course by problem list:  49 yo male with Hx uncontrolled DM, esophagitis, here with HHS and UGI bleed.   Coffee ground hematemsis - resolved -with hx of GERD w/ esophagitis, hx of mallory-weiss tear, and alcoholic pancreatitis-- nausea likely 2/2 to HHS and bleeding due likely due to esophagitis. EGD last on 2008 by Dr. Watt Climes [facility]) and showed distal esophageal ulcerations and esophagitis. Had GERD symptoms but improved. Coffee ground hematemesis also resolved. BUN was also high initially in 70's now normalized.  Consulted GI here, had EGD on the day of discharge and showed distal erosive esophagitis and small hiatal hernia. recommended Protonix 40 mg BID.  - continue  protonix BID per gi rec, also continue carafate temporarily as he had improvement with it.  - hgb stable 8.1 on discharge.This should be followed on follow up.   Uncontrolled DM II - presented with Hyperosmolar hyperglycemic state-- Improved with insulin drip.  Hx of DM II last hgba1c 9.6 on 05/2014. Repeat was 13.5. Home regimen lantus 44 units + novolog 1-10 units SSI TID.  Presented glucose 1596 on admission, neg for ketones, ikely HHS 2/2 to medication non compliance.  Transitioned to 20 lantus 6/6 and SSI, started diet. CBG overnight has been in high 200-300's.  - consulted to care management for help with meds. Has poor home situation (lacks access to food and water). States that he recently got disability benefit and that should help.  - will do lantus 44 units + novolog 5 units TID with meals. This should be further titrated outpatient.  Acute on CKD 2--likely 2/2 to vomitting. 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 b/l Cr around 1.4. Cr on admission 2.57. Now down to 1.6.  - d/c fluid if taking PO intake.  - follow BMET outpatient.  Chronic normocytic anemia - baseline has been as low as 7-8, but mainly 9-10's. Had UGI bleed and currently hgb stable at 8.1. Last anemia panel showed low iron and low normal ferritin. Low folate. On repeat, folate is normal along with normal iron and ferritin. - cont PO iron and started PO folic acid daily.  HTN--BP up in 170's this am.  -hold home lisinopril 40mg  qd as still has AKI and HCTZ for volume depletion on presentation - started amlodipine 5mg  daily. F/up outpt  Hyperkalemia-- resolved. initially K+ 6.3 w/ peaked T waves on EKG. Was Given calcium gluconate x 1 and it normalized.  Phantom limb pain - cont home neurontin 600mg  TID  Depression - cont home prozac 10mg   Tobacco abuse-- smokes 1/2 PPD since he was 49 y/o - nicotine 21 mg patch   Discharge Vitals:   BP 148/94 mmHg  Pulse 101  Temp(Src) 98.8 F  (37.1 C) (Oral)  Resp 24  Ht 5\' 9"  (1.753 m)  Wt 196 lb (88.905 kg)  BMI 28.93 kg/m2  SpO2 98%  Discharge Labs:  Results for orders placed or performed during the hospital encounter of 09/13/14 (from the past 24 hour(s))  Glucose, capillary     Status: Abnormal   Collection Time: 09/15/14  4:11 PM  Result Value Ref Range   Glucose-Capillary 319 (H) 65 - 99 mg/dL  Glucose, capillary     Status: Abnormal   Collection Time: 09/15/14  9:06 PM  Result Value Ref Range   Glucose-Capillary 306 (H) 65 - 99 mg/dL  CBC     Status: Abnormal   Collection Time: 09/16/14  5:30 AM  Result Value Ref Range   WBC 10.2 4.0 - 10.5 K/uL   RBC 2.82 (L) 4.22 - 5.81 MIL/uL   Hemoglobin 8.1 (L) 13.0 - 17.0 g/dL   HCT 24.5 (L) 39.0 - 52.0 %   MCV 86.9 78.0 - 100.0 fL   MCH 28.7 26.0 - 34.0 pg   MCHC 33.1 30.0 - 36.0 g/dL   RDW 12.3 11.5 - 15.5 %   Platelets 369 150 - 400 K/uL  Basic metabolic panel     Status: Abnormal   Collection Time: 09/16/14  5:30 AM  Result Value Ref Range   Sodium 136 135 - 145 mmol/L   Potassium 4.0 3.5 - 5.1 mmol/L   Chloride 102 101 - 111 mmol/L   CO2 24 22 - 32 mmol/L   Glucose, Bld 233 (H) 65 - 99 mg/dL   BUN 20 6 - 20 mg/dL   Creatinine, Ser 1.65 (H) 0.61 - 1.24 mg/dL   Calcium 8.7 (L) 8.9 - 10.3 mg/dL   GFR calc non Af Amer 47 (L) >60 mL/min   GFR calc Af Amer 55 (L) >60 mL/min   Anion gap 10 5 - 15  Glucose, capillary     Status: Abnormal  Collection Time: 09/16/14  7:28 AM  Result Value Ref Range   Glucose-Capillary 180 (H) 65 - 99 mg/dL  Glucose, capillary     Status: Abnormal   Collection Time: 09/16/14 10:35 AM  Result Value Ref Range   Glucose-Capillary 206 (H) 65 - 99 mg/dL  Glucose, capillary     Status: Abnormal   Collection Time: 09/16/14 11:11 AM  Result Value Ref Range   Glucose-Capillary 185 (H) 65 - 99 mg/dL    Signed: Dellia Nims, MD 09/16/2014, 2:00 PM    Services Ordered on Discharge:  Equipment Ordered on Discharge:

## 2014-09-16 NOTE — Op Note (Signed)
[facility] [place], [address]   ENDOSCOPY PROCEDURE REPORT  PATIENT: Wesley, [name]  MR#: [id number] BIRTHDATE: Nov 17, 1965 , 49  yrs. old GENDER: male ENDOSCOPIST: Acquanetta Sit, MD REFERRED BY: PROCEDURE DATE:  September 29, 2014 PROCEDURE:  EGD ASA CLASS:     2 INDICATIONS:  hematemesis MEDICATIONS: propofol per anesthesia TOPICAL ANESTHETIC:  DESCRIPTION OF PROCEDURE: After the risks benefits and alternatives of the procedure were thoroughly explained, informed consent was obtained.  The Pentax      endoscope was introduced through the mouth and advanced to the second portion of the duodenum , Without limitations.  The instrument was slowly withdrawn as the mucosa was fully examined. Estimated blood loss is zero unless otherwise noted in this procedure report.  Findings:  Esophagus: Distal erosive esophagitis  Stomach: Small hiatal hernia otherwise normal mucosa  Duodenum: Normal    The scope was then withdrawn from the patient and the procedure completed.  COMPLICATIONS: There were no immediate complications.  ENDOSCOPIC IMPRESSION:distal erosive esophagitis and a small hiatal hernia   RECOMMENDATIONS:recommend twice a day pantoprazole 40 mg. We will sign off. Call if needed.   REPEAT EXAM:  eSignedAcquanetta Sit, MD 09-29-14 10:28 AM    CC:  CPT CODES: ICD CODES:  The ICD and CPT codes recommended by this software are interpretations from the data that the clinical staff has captured with the software.  The verification of the translation of this report to the ICD and CPT codes and modifiers is the sole responsibility of the health care institution and practicing physician where this report was generated.  [employer] will not be held responsible for the validity of the ICD and CPT codes included on this report.  AMA assumes no liability for data contained or not contained herein. CPT is a  Designer, television/film set of the [employer].

## 2014-09-17 ENCOUNTER — Encounter (HOSPITAL_COMMUNITY): Payer: Self-pay | Admitting: Gastroenterology

## 2014-09-21 ENCOUNTER — Telehealth: Payer: Self-pay | Admitting: Internal Medicine

## 2014-09-21 NOTE — Telephone Encounter (Signed)
Call to patient to follow up from hospital stay.

## 2014-09-22 NOTE — Telephone Encounter (Signed)
Call to patient to confirm appointment for 6/15 at 1:15 phones are unavailable

## 2014-09-23 ENCOUNTER — Ambulatory Visit: Payer: Medicaid Other | Attending: Orthopedic Surgery | Admitting: Physical Therapy

## 2014-09-23 ENCOUNTER — Encounter: Payer: Self-pay | Admitting: Internal Medicine

## 2014-09-23 ENCOUNTER — Ambulatory Visit (INDEPENDENT_AMBULATORY_CARE_PROVIDER_SITE_OTHER): Payer: Medicaid Other | Admitting: Internal Medicine

## 2014-09-23 VITALS — BP 162/78 | HR 109 | Temp 98.1°F | Ht 69.0 in | Wt 222.2 lb

## 2014-09-23 VITALS — BP 126/79 | HR 109

## 2014-09-23 DIAGNOSIS — F329 Major depressive disorder, single episode, unspecified: Secondary | ICD-10-CM

## 2014-09-23 DIAGNOSIS — Z9114 Patient's other noncompliance with medication regimen: Secondary | ICD-10-CM

## 2014-09-23 DIAGNOSIS — Z794 Long term (current) use of insulin: Secondary | ICD-10-CM | POA: Diagnosis not present

## 2014-09-23 DIAGNOSIS — E118 Type 2 diabetes mellitus with unspecified complications: Principal | ICD-10-CM

## 2014-09-23 DIAGNOSIS — Z7409 Other reduced mobility: Secondary | ICD-10-CM | POA: Diagnosis present

## 2014-09-23 DIAGNOSIS — I10 Essential (primary) hypertension: Secondary | ICD-10-CM | POA: Diagnosis not present

## 2014-09-23 DIAGNOSIS — R269 Unspecified abnormalities of gait and mobility: Secondary | ICD-10-CM | POA: Insufficient documentation

## 2014-09-23 DIAGNOSIS — R2689 Other abnormalities of gait and mobility: Secondary | ICD-10-CM

## 2014-09-23 DIAGNOSIS — F17200 Nicotine dependence, unspecified, uncomplicated: Secondary | ICD-10-CM

## 2014-09-23 DIAGNOSIS — K219 Gastro-esophageal reflux disease without esophagitis: Secondary | ICD-10-CM

## 2014-09-23 DIAGNOSIS — Z89512 Acquired absence of left leg below knee: Secondary | ICD-10-CM | POA: Insufficient documentation

## 2014-09-23 DIAGNOSIS — E1165 Type 2 diabetes mellitus with hyperglycemia: Secondary | ICD-10-CM

## 2014-09-23 DIAGNOSIS — M24661 Ankylosis, right knee: Secondary | ICD-10-CM | POA: Insufficient documentation

## 2014-09-23 DIAGNOSIS — F17290 Nicotine dependence, other tobacco product, uncomplicated: Secondary | ICD-10-CM

## 2014-09-23 DIAGNOSIS — Z89511 Acquired absence of right leg below knee: Secondary | ICD-10-CM | POA: Diagnosis present

## 2014-09-23 DIAGNOSIS — R29818 Other symptoms and signs involving the nervous system: Secondary | ICD-10-CM | POA: Insufficient documentation

## 2014-09-23 DIAGNOSIS — F32A Depression, unspecified: Secondary | ICD-10-CM

## 2014-09-23 DIAGNOSIS — IMO0002 Reserved for concepts with insufficient information to code with codable children: Secondary | ICD-10-CM

## 2014-09-23 LAB — CBC
HEMATOCRIT: 23.8 % — AB (ref 39.0–52.0)
HEMOGLOBIN: 7.6 g/dL — AB (ref 13.0–17.0)
MCH: 28.7 pg (ref 26.0–34.0)
MCHC: 31.9 g/dL (ref 30.0–36.0)
MCV: 89.8 fL (ref 78.0–100.0)
MPV: 8.9 fL (ref 8.6–12.4)
Platelets: 505 10*3/uL — ABNORMAL HIGH (ref 150–400)
RBC: 2.65 MIL/uL — ABNORMAL LOW (ref 4.22–5.81)
RDW: 14.8 % (ref 11.5–15.5)
WBC: 10.2 10*3/uL (ref 4.0–10.5)

## 2014-09-23 LAB — BASIC METABOLIC PANEL WITH GFR
BUN: 17 mg/dL (ref 6–23)
CO2: 25 mEq/L (ref 19–32)
Calcium: 8.6 mg/dL (ref 8.4–10.5)
Chloride: 104 mEq/L (ref 96–112)
Creat: 1.61 mg/dL — ABNORMAL HIGH (ref 0.50–1.35)
GFR, EST NON AFRICAN AMERICAN: 49 mL/min — AB
GFR, Est African American: 57 mL/min — ABNORMAL LOW
Glucose, Bld: 187 mg/dL — ABNORMAL HIGH (ref 70–99)
Potassium: 3.9 mEq/L (ref 3.5–5.3)
Sodium: 139 mEq/L (ref 135–145)

## 2014-09-23 MED ORDER — AMLODIPINE BESYLATE 10 MG PO TABS: 5.0000 mg | ORAL_TABLET | Freq: Every day | ORAL | Status: DC

## 2014-09-23 MED ORDER — FLUOXETINE HCL 10 MG PO CAPS: 10.0000 mg | ORAL_CAPSULE | Freq: Every day | ORAL | Status: DC

## 2014-09-23 NOTE — Patient Instructions (Addendum)
General Instructions: Increase your Amlodipine from 5 mg to 10 mg daily Please restart you Prozac 10 mg daily  We are keeping everything as before  You can come back in 2 weeks  Thank you for bringing your medicines today. This helps [place] keep you safe from mistakes.   Progress Toward Treatment Goals:  Treatment Goal 09/23/2014  Hemoglobin A1C unchanged  Blood pressure deteriorated  Stop smoking smoking the same amount    Self Care Goals & Plans:  Self Care Goal 09/23/2014  Manage my medications take my medicines as prescribed; bring my medications to every visit; refill my medications on time; follow the sick day instructions if I am sick  Monitor my health keep track of my blood glucose; bring my glucose meter and log to each visit  Eat healthy foods eat more vegetables; eat fruit for snacks and desserts; eat foods that are low in salt; drink diet soda or water instead of juice or soda  Be physically active find an activity I enjoy    Home Blood Glucose Monitoring 09/23/2014  Check my blood sugar 3 times a day  When to check my blood sugar before breakfast; before lunch; before dinner     Care Management & Community Referrals:  Referral 09/23/2014  Referrals made for care management support none needed  Referrals made to community resources -

## 2014-09-23 NOTE — Therapy (Signed)
[facility] [address] [place], [place], [address] Phone: [phone]   Fax:  [phone]  Physical Therapy Treatment  Patient Details  Name: [name] MRN: [id number] Date of Birth: 19-Jul-1965 Referring Provider:  [name], MD  Encounter Date: 09/23/2014      PT End of Session - 09/23/14 1100    Visit Number 7   Number of Visits 9   Authorization Type Medicaid   Authorization Time Period 8 visits 06/24/14 - 10/13/14   Authorization - Visit Number 6   Authorization - Number of Visits 8   PT Start Time 1100   PT Stop Time 1148   PT Time Calculation (min) 48 min   Equipment Utilized During Treatment Gait belt   Activity Tolerance Patient tolerated treatment well   Behavior During Therapy [facility] for tasks assessed/performed      Past Medical History  Diagnosis Date  . Onychomycosis   . Hypertension   . Diabetic peripheral neuropathy   . Diabetic foot ulcer     s/p Right first ray amputation, left transmetatarsal amputation with revision  . History of drug abuse     Cocaine and marijuana  . Exposure to trichomonas     treated empirically  . Cellulitis of left foot 03/2008    left 4th and 5th metatarsal area  . Hyperlipidemia   . Alcoholic pancreatitis 19/4174  . Ulcerative esophagitis 0814    severe, complicated with UGI bleed  . Mallory [name] tear April 2009  . History of prolonged Q-T interval on ECG   . 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  . 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 CKD (chronic kidney disease) stage 2, GFR 60-89 ml/min     BL SCr 1.3-1.4  . Depression   . Peripheral vascular disease   . Type 2 diabetes mellitus, uncontrolled, with renal complications   . GERD (gastroesophageal reflux disease)   . Hx of right BKA 09/13/2012     Due  to severe wound infection with sepsis  . 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 Cellulitis and abscess of leg 01/31/2014  . Osteomyelitis of left leg 01/21/2014  . History of left below knee amputation 01/21/2014    Performed on    . Status post below knee amputation of right lower extremity 03/23/2014    Past Surgical History  Procedure Laterality Date  . Toe amputation  06/2011    left; 4th toe  . Nephrectomy      L side  . I&d extremity  08/24/2011    Procedure: IRRIGATION AND DEBRIDEMENT EXTREMITY;  Surgeon: Meredith Pel, MD;  Location: [place];  Service: Orthopedics;  Laterality: Left;  . Amputation  08/24/2011    Procedure: AMPUTATION RAY;  Surgeon: Meredith Pel, MD;  Location: [place];  Service: Orthopedics;  Laterality: Left;  left fourth toe Ray Resection  . Amputation  12/05/2011    Procedure: AMPUTATION RAY;  Surgeon: Meredith Pel, MD;  Location: [place];  Service: Orthopedics;  Laterality: Left;  . I&d extremity  02/18/2012    Procedure: IRRIGATION AND DEBRIDEMENT EXTREMITY;  Surgeon: Mcarthur Rossetti, MD;  Location: [facility]  OR;  Service: Orthopedics;  Laterality: Left;  . Amputation  02/18/2012    Procedure: AMPUTATION DIGIT;  Surgeon: Mcarthur Rossetti, MD;  Location: [place];  Service: Orthopedics;  Laterality: Left;  revison transmetatarsal  . I&d extremity Left 08/28/2012    Procedure: IRRIGATION AND DEBRIDEMENT EXTREMITY;  Surgeon: Linna Hoff, MD;  Location: [place];  Service: Orthopedics;  Laterality: Left;  CYSTO TUBING/IRRIGATION, LEAD HAND.  . I&d extremity Left 08/30/2012    Procedure: IRRIGATION AND DEBRIDEMENT EXTREMITY;  Surgeon: Linna Hoff, MD;  Location: [place];  Service: Orthopedics;  Laterality: Left;  . Amputation Right 09/01/2012    Procedure: FOOT 1ST RAY AMPUTATION;  Surgeon: Newt Minion, MD;  Location: [place];  Service: Orthopedics;  Laterality: Right;  . Amputation Left 09/01/2012    Procedure: FOOT REVISION TRANSMETATARSAL AMPUTATION ;  Surgeon: Newt Minion, MD;  Location: [place];  Service: Orthopedics;  Laterality: Left;  . Amputation Right 09/13/2012    Procedure: AMPUTATION BELOW KNEE;  Surgeon: Newt Minion, MD;  Location: [place];  Service: Orthopedics;  Laterality: Right;  Right Below Knee Amputation  . Amputation Left 12/12/2013    Procedure: Left Below Knee Amputation;  Surgeon: Newt Minion, MD;  Location: [place];  Service: Orthopedics;  Laterality: Left;  . Stump revision Left 01/23/2014    Procedure: STUMP REVISION;  Surgeon: Newt Minion, MD;  Location: [place];  Service: Orthopedics;  Laterality: Left;  . Esophagogastroduodenoscopy (egd) with propofol N/A 09/16/2014    Procedure: ESOPHAGOGASTRODUODENOSCOPY (EGD) WITH PROPOFOL;  Surgeon: Wonda Horner, MD;  Location: [facility] ENDOSCOPY;  Service: Endoscopy;  Laterality: N/A;    Filed Vitals:   09/23/14 1111  BP: 126/79  Pulse: 109    Visit Diagnosis:  Impaired functional mobility and activity tolerance  Balance problems  Abnormality of gait  Decreased range of motion of knee, right  Status post bilateral below knee amputation      Subjective Assessment - 09/23/14 1109    Subjective He was hospitalized ~09/12/14 for 4 days for severe hyperglycemia. He authorized / okay to resume PT.    Patient is accompained by: Family member   Currently in Pain? No/denies                         [facility] Adult PT Treatment/Exercise - 09/23/14 1100    Transfers   Sit to Stand 5: Supervision;With upper extremity assist;From chair/3-in-1   Stand to Sit 5: Supervision;With upper extremity assist;To chair/3-in-1   Ambulation/Gait   Ambulation/Gait Yes   Ambulation/Gait Assistance 5: Supervision   Ambulation/Gait Assistance Details verbal cues on sequence, step length, posture   Ambulation Distance (Feet) 450  Feet  3 reps   Assistive device Prostheses;Lofstrands   Gait Pattern Step-through pattern;Decreased stride length;Trunk flexed   Ambulation Surface Indoor;Level   Stairs Yes   Stairs Assistance 5: Supervision   Stairs Assistance Details (indicate cue type and reason) cues on wt shift, alternating lead limb   Stair Management Technique One rail Right;With crutches;Step to pattern;Forwards  crutch on left,    Number of Stairs 4  3 reps   Ramp 5: Supervision  with forearm crutches / prostheses   Ramp Details (indicate cue type and reason) cues on posture & wt shift   Curb 5: Supervision  with forearm crutches / prostheses   Curb Details (indicate cue type and reason) cues on foot placement & technique   High Level Balance  High Level Balance Activities Side stepping;Backward walking  with forearm crutches & prostheses   High Level Balance Comments cues on wt shift over stance limb and posture   Prosthetics   Prosthetic Care Comments  sweating indications & drying limb /liner   Current prosthetic wear tolerance (days/week)  daily   Current prosthetic wear tolerance (#hours/day)  ~90% of awake hours   Residual limb condition  intact both legs   Education Provided Residual limb care;Proper wear schedule/adjustment  wear all awake hrs: fall risk & function during day   Person(s) Educated Patient;Child(ren)   Education Method Explanation;Verbal cues   Education Method Verbalized understanding;Verbal cues required   Donning Prosthesis Modified independent (device/increased time)   Doffing Prosthesis Modified independent (device/increased time)                  PT Short Term Goals - 08/26/14 1015    PT SHORT TERM GOAL #1   Title wears right prosthesis >90% of awake hours & left prosthesis >75% of awake hours with no skin issues & pain <4/10 ((Target Date: 4th visit after eval)   Baseline 4/27- pt inconsistent with how long he wears his prostheses, vaires depending on his  pain/fatigue level's   Status Partially Met   PT SHORT TERM GOAL #2   Title verballized proper prosthetic care except cues on adjusting ply socks. ((Target Date: 4th visit after eval)   Baseline 4/27- Independent with care, needing cues on sock management only.   Status Achieved   PT SHORT TERM GOAL #3   Title ambulates 400' with rolling walker & prostheses modified independent. ((Target Date: 4th visit after eval)   Baseline on 08/06/14   Status Achieved   PT SHORT TERM GOAL #4   Title negotiates ramp & curb with rolling walker and stairs with 2 rails with bilateral prostheses with supervision. ((Target Date: 4th visit after eval)   Baseline on 08/05/14   Status Achieved   PT SHORT TERM GOAL #5   Title stands without UE support 1 minute & reaches 4" with supervision. ((Target Date: 4th visit after eval)   Baseline on 08/06/14   Status Achieved           PT Long Term Goals - 06/19/14 0930    PT LONG TERM GOAL #1   Title tolerates wear of bilateral prostheses >90% of awake hours without skin issues & pain </= 2/10 (Target Date: 8th visit after eval)   Baseline He has progressed wear of bilateral prostheses to 8 hours straight but pain up to 8/10 and skin is too wet placing at risk of breakdown.   Status New   PT LONG TERM GOAL #2   Title independent with prosthetic care. (Target Date: 8th visit after eval)   Baseline Patient is dependent in prosthetic care.   Status New   PT LONG TERM GOAL #3   Title ambulates 500' with LRAD & prostheses modified independent. (Target Date: 8th visit after eval)   Baseline Patient ambulates 100' with rolling walker & prostheses with supervision / cues for safety.   Status New   PT LONG TERM GOAL #4   Title negotiates ramp, curb & stairs with LRAD & prostheses modified independent. (Target Date: 8th visit after eval)   Baseline Patient is non-ambulatory with bilateral prostheses on barriers.   Status New   PT LONG TERM GOAL #5   Title Berg Balance  >36/56 (Target Date: 8th visit after eval)   Baseline Berg Balance 9/56  Status New               Plan - 09/23/14 1100    Clinical Impression Statement Patient improved ability to use forearm crutches. His gait with forearm crutches vs RW is more fluent, faster, more upright & less UE support. Patient appears to understand benefits to wearing prostheses all awake hours including if ill.   Pt will benefit from skilled therapeutic intervention in order to improve on the following deficits Abnormal gait;Decreased activity tolerance;Decreased balance;Decreased knowledge of use of DME;Decreased mobility;Decreased range of motion;Other (comment)  prosthetic dependency   Rehab Potential Good   PT Frequency 1x / week   PT Duration 8 weeks   PT Treatment/Interventions ADLs/Self Care Home Management;DME Instruction;Gait training;Stair training;Functional mobility training;Therapeutic activities;Therapeutic exercise;Balance training;Neuromuscular re-education;Patient/family education;Other (comment)  prosthetic training   PT Next Visit Plan continue with forearm crutches, try one crutch for household activities,  monitor BP   Consulted and Agree with Plan of Care Patient        Problem List Patient Active Problem List   Diagnosis Date Noted  . Type 2 diabetes mellitus with hyperosmolarity without nonketotic hyperglycemic-hyperosmolar coma (nkhhc) 09/14/2014  . Uncontrolled type 2 DM with hyperosmolar nonketotic hyperglycemia 09/14/2014  . Phantom limb pain 06/17/2014  . CN (constipation) 05/26/2014  . Erectile dysfunction 05/26/2014  . Status post below knee amputation of right lower extremity 03/23/2014  . Anemia, iron deficiency 02/04/2014  . History of left below knee amputation 01/21/2014  . Below knee amputation status 12/12/2013  . S/P Right BKA  10/09/2012  . Hyperlipedemia 06/24/2012  . GERD (gastroesophageal reflux disease) 03/13/2012  . Depression 09/05/2011  . Marginal  Housing 03/28/2011  . Essential hypertension 09/22/2008  . DIABETIC PERIPHERAL NEUROPATHY 07/21/2008  . CKD Stage 2 07/21/2008  . TOBACCO ABUSE 01/03/2006  . DRUG ABUSE 01/03/2006  . Diabetes mellitus type 2, uncontrolled, with complications 16/24/4695    Alarik Radu PT, DPT 09/23/2014, 12:17 PM  [employer] [address] [place], [place], [address] Phone: [phone]   Fax:  [phone]

## 2014-09-23 NOTE — Progress Notes (Signed)
Patient ID: [name], male   DOB: 09/17/1965, 49 y.o.   MRN: [id number]   Subjective:   HPI: [name] is a 49 y.o. gentleman with past medical history below presents for a hospital follow up visit.  He was discharged from the hospital on 09/16/2014 after 3 days of hospitalization.  Reason(s) for visit:  1.  Hyperglycemia: Patient was noncompliant with his insulin prior to his hospitalization. As a consequence, his CBGs were above 600 and this improved rapidly with insulin administration during his hospital stay. He states that his been now compliant with his current insulin regimen of 44 units of Lantus every morning and sliding scale NovoLog 3 times a day. His glucometer has been reviewed. 2. Hematemesis: Prior to his hospitalization, patient reported history of coffee ground hematemesis. This resolved prior to discharge. EGD revealed distal erosive loss pharyngitis and small hiatal hernia. GI recommended increasing his Protonix to 40 mg twice a day to which he has been compliant. He has not had a recurrence of hematemesis or brown black stool or rectal bleeding. His GERD symptoms are well controlled at his current regimen. Has also been using Carafate which has greatly improved his symptoms. Hemoglobin on discharge was 8.1. I will recheck CBC. He also takes iron supplementation orally. 3. Acute renal injury: Patient has a history of CKD, stage II. He has a history of left nephrectomy due to chronic nephritis. On admission his creatinine was significantly elevated to 2.57 and this improved with fluid administration. At discharge his creatinine was 1.6. We will reassess it today.   Past Medical History  Diagnosis Date  . Onychomycosis   . Hypertension   . Diabetic peripheral neuropathy   . Diabetic foot ulcer     s/p Right first ray amputation, left transmetatarsal amputation with revision  . History of drug abuse     Cocaine and marijuana  . Exposure to trichomonas     treated  empirically  . Cellulitis of left foot 03/2008    left 4th and 5th metatarsal area  . Hyperlipidemia   . Alcoholic pancreatitis 05/4095  . Ulcerative esophagitis 3532    severe, complicated with UGI bleed  . Mallory [name] tear April 2009  . History of prolonged Q-T interval on ECG   . 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  . 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 CKD (chronic kidney disease) stage 2, GFR 60-89 ml/min     BL SCr 1.3-1.4  . Depression   . Peripheral vascular disease   . Type 2 diabetes mellitus, uncontrolled, with renal complications   . GERD (gastroesophageal reflux disease)   . 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 Cellulitis and abscess of leg 01/31/2014  . Osteomyelitis of left leg 01/21/2014  . History of left below knee amputation 01/21/2014    Performed on    . Status post below knee amputation of right lower extremity 03/23/2014    ROS: Constitutional:  Denies fevers, chills, diaphoresis, appetite change and fatigue.  Respiratory: Denies SOB, DOE, cough, chest tightness, and wheezing.  CVS:  No chest pain, palpitations and leg swelling.  GI: No abdominal pain, nausea, vomiting, bloody stools GU: No dysuria, frequency, hematuria, or flank pain.  MSK: No myalgias, back pain, joint swelling, arthralgias  Psych: No depression symptoms. No SI or SA.    Objective:  Physical Exam: Filed Vitals:   09/23/14 1328  BP: 162/78  Pulse: 109  Temp: 98.1 F (36.7 C)  TempSrc: Oral  Height: 5\' 9"  (1.753 m)  Weight: 222 lb 3.2 oz (100.789 kg)  SpO2: 100%   General: Well nourished. No acute  distress.  HEENT: Normal oral mucosa. MMM.  Lungs: CTA bilaterally. No wheezing. Heart: RRR; no extra sounds or murmurs  Abdomen: Non-distended, normal bowel sounds, soft, nontender; no hepatosplenomegaly  Extremities: bilateral BKA, uses prosthetic and crutches currently. No joint swelling or tenderness. Neurologic: Normal EOM,  Alert and oriented x3. No obvious neurologic/cranial nerve deficits.  Assessment & Plan:  Discussed case with Dr Daryll Drown See problem based charting for assessment and plan.

## 2014-09-24 NOTE — Assessment & Plan Note (Signed)
BP Readings from Last 3 Encounters:  09/23/14 162/78  09/23/14 126/79  09/16/14 148/94    Lab Results  Component Value Date   NA 139 09/23/2014   K 3.9 09/23/2014   CREATININE 1.61* 09/23/2014    Assessment: Blood pressure control: moderately elevated Progress toward BP goal:  deterioratedunchanged Comments: During his hospitalization, HCTZ and lisinopril where discontinued in the setting of acute renal injury. He was started on amlodipine 5 mg daily.  Plan: Medications:  Increase Amlodipine to 10 mg daily Educational resources provided: brochure, handout, video Self management tools provided:   Other plans: Follow-up in 2 weeks. If renal function is improved, I will put him back on an ACE inhibitor

## 2014-09-24 NOTE — Assessment & Plan Note (Signed)
Symptoms well controlled on a small dose of Prozac of 10 mg daily but he has been taking himself off this and symptoms tend to recur when he is off. Will continue.

## 2014-09-24 NOTE — Assessment & Plan Note (Signed)
Lab Results  Component Value Date   HGBA1C 13.5* 09/13/2014   HGBA1C 9.6 05/26/2014   HGBA1C 9.6 01/21/2014     Assessment: Diabetes control: poor control (HgbA1C >9%) Progress toward A1C goal:  unchanged Comments: Intermittent noncompliance with insulin. Average CBG of 258. Highest 445 and lowest 110. Currently on Lantus 44 units in the a.m. Also on sliding scale NovoLog 5 units 3 times a day with meals.   Plan: Medications:  continue current medications, Will consider addition of Metformin on his current regimen. Renal function should be able to tolerate it. Home glucose monitoring: Frequency: 3 times a day Timing: before breakfast, before lunch, before dinner Instruction/counseling given: reminded to get eye exam and reminded to bring blood glucose meter & log to each visit Educational resources provided: brochure, handout Self management tools provided: copy of home glucose meter download Other plans: Follow-up in 2 weeks.

## 2014-09-24 NOTE — Assessment & Plan Note (Signed)
He tells me now that he switched from cigarettes to cigars. He is not ready to quit. Will continue to evaluate his readiness to quit on subsequent visits.

## 2014-09-24 NOTE — Assessment & Plan Note (Signed)
No recurrence of hematemesis since discharge. Hemoglobin level remains at 7.6 from 8.1 on discharge. Recent EGD revealed erosive esophagitis. Patient is asymptomatic. His most on a Iron, PPI and Carafate. Will continue with above therapy and monitor his CBC and clinical status closely. May consider outpatient follow-up with GI if needed. Follow up in 2 weeks with repeat CBC.

## 2014-09-29 NOTE — Progress Notes (Signed)
Internal Medicine Clinic Attending  Case discussed with Dr. Kazibwe soon after the resident saw the patient.  We reviewed the resident's history and exam and pertinent patient test results.  I agree with the assessment, diagnosis, and plan of care documented in the resident's note. 

## 2014-09-30 ENCOUNTER — Ambulatory Visit: Payer: Medicaid Other | Admitting: Physical Therapy

## 2014-09-30 ENCOUNTER — Encounter: Payer: Self-pay | Admitting: Physical Therapy

## 2014-09-30 VITALS — BP 168/85 | HR 93

## 2014-09-30 DIAGNOSIS — Z89512 Acquired absence of left leg below knee: Secondary | ICD-10-CM

## 2014-09-30 DIAGNOSIS — Z7409 Other reduced mobility: Secondary | ICD-10-CM | POA: Diagnosis not present

## 2014-09-30 DIAGNOSIS — R2689 Other abnormalities of gait and mobility: Secondary | ICD-10-CM

## 2014-09-30 DIAGNOSIS — R269 Unspecified abnormalities of gait and mobility: Secondary | ICD-10-CM

## 2014-09-30 DIAGNOSIS — Z89511 Acquired absence of right leg below knee: Secondary | ICD-10-CM

## 2014-09-30 NOTE — Therapy (Signed)
[facility] [address] [place], [place], [address] Phone: [phone]   Fax:  [phone]  Physical Therapy Treatment  Patient Details  Name: [name] MRN: [id number] Date of Birth: 1965/04/11 Referring Provider:  [name], MD  Encounter Date: 09/30/2014      PT End of Session - 09/30/14 1100    Visit Number 8   Number of Visits 9   Authorization Type Medicaid   Authorization Time Period 8 visits 06/24/14 - 10/13/14   Authorization - Visit Number 7   Authorization - Number of Visits 8   PT Start Time 1100   PT Stop Time 1144   PT Time Calculation (min) 44 min   Equipment Utilized During Treatment Gait belt   Activity Tolerance Patient tolerated treatment well   Behavior During Therapy [facility] for tasks assessed/performed      Past Medical History  Diagnosis Date  . Onychomycosis   . Hypertension   . Diabetic peripheral neuropathy   . Diabetic foot ulcer     s/p Right first ray amputation, left transmetatarsal amputation with revision  . History of drug abuse     Cocaine and marijuana  . Exposure to trichomonas     treated empirically  . Cellulitis of left foot 03/2008    left 4th and 5th metatarsal area  . Hyperlipidemia   . Alcoholic pancreatitis 00/8676  . Ulcerative esophagitis 1950    severe, complicated with UGI bleed  . Mallory [name] tear April 2009  . History of prolonged Q-T interval on ECG   . 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  . 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 CKD (chronic kidney disease) stage 2, GFR 60-89 ml/min     BL SCr 1.3-1.4  . Depression   . Peripheral vascular disease   . Type 2 diabetes mellitus, uncontrolled, with renal complications   . GERD (gastroesophageal reflux disease)   . Hx of right BKA 09/13/2012     Due  to severe wound infection with sepsis  . 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 Cellulitis and abscess of leg 01/31/2014  . Osteomyelitis of left leg 01/21/2014  . History of left below knee amputation 01/21/2014    Performed on    . Status post below knee amputation of right lower extremity 03/23/2014    Past Surgical History  Procedure Laterality Date  . Toe amputation  06/2011    left; 4th toe  . Nephrectomy      L side  . I&d extremity  08/24/2011    Procedure: IRRIGATION AND DEBRIDEMENT EXTREMITY;  Surgeon: Meredith Pel, MD;  Location: [place];  Service: Orthopedics;  Laterality: Left;  . Amputation  08/24/2011    Procedure: AMPUTATION RAY;  Surgeon: Meredith Pel, MD;  Location: [place];  Service: Orthopedics;  Laterality: Left;  left fourth toe Ray Resection  . Amputation  12/05/2011    Procedure: AMPUTATION RAY;  Surgeon: Meredith Pel, MD;  Location: [place];  Service: Orthopedics;  Laterality: Left;  . I&d extremity  02/18/2012    Procedure: IRRIGATION AND DEBRIDEMENT EXTREMITY;  Surgeon: Mcarthur Rossetti, MD;  Location: [facility]  OR;  Service: Orthopedics;  Laterality: Left;  . Amputation  02/18/2012    Procedure: AMPUTATION DIGIT;  Surgeon: Mcarthur Rossetti, MD;  Location: [place];  Service: Orthopedics;  Laterality: Left;  revison transmetatarsal  . I&d extremity Left 08/28/2012    Procedure: IRRIGATION AND DEBRIDEMENT EXTREMITY;  Surgeon: Linna Hoff, MD;  Location: [place];  Service: Orthopedics;  Laterality: Left;  CYSTO TUBING/IRRIGATION, LEAD HAND.  . I&d extremity Left 08/30/2012    Procedure: IRRIGATION AND DEBRIDEMENT EXTREMITY;  Surgeon: Linna Hoff, MD;  Location: [place];  Service: Orthopedics;  Laterality: Left;  . Amputation Right 09/01/2012    Procedure: FOOT 1ST RAY AMPUTATION;  Surgeon: Newt Minion, MD;  Location: [place];  Service: Orthopedics;  Laterality: Right;  . Amputation Left 09/01/2012    Procedure: FOOT REVISION TRANSMETATARSAL AMPUTATION ;  Surgeon: Newt Minion, MD;  Location: [place];  Service: Orthopedics;  Laterality: Left;  . Amputation Right 09/13/2012    Procedure: AMPUTATION BELOW KNEE;  Surgeon: Newt Minion, MD;  Location: [place];  Service: Orthopedics;  Laterality: Right;  Right Below Knee Amputation  . Amputation Left 12/12/2013    Procedure: Left Below Knee Amputation;  Surgeon: Newt Minion, MD;  Location: [place];  Service: Orthopedics;  Laterality: Left;  . Stump revision Left 01/23/2014    Procedure: STUMP REVISION;  Surgeon: Newt Minion, MD;  Location: [place];  Service: Orthopedics;  Laterality: Left;  . Esophagogastroduodenoscopy (egd) with propofol N/A 09/16/2014    Procedure: ESOPHAGOGASTRODUODENOSCOPY (EGD) WITH PROPOFOL;  Surgeon: Wonda Horner, MD;  Location: [facility] ENDOSCOPY;  Service: Endoscopy;  Laterality: N/A;    Filed Vitals:   09/30/14 1118  BP: 168/85  Pulse: 93    Visit Diagnosis:  Impaired functional mobility and activity tolerance  Balance problems  Abnormality of gait  Status post bilateral below knee amputation      Subjective Assessment - 09/30/14 1118    Pain Score --   Pain Location Toe (Comment which one)                         [facility] Adult PT Treatment/Exercise - 09/30/14 1100    Transfers   Sit to Stand 5: Supervision;With upper extremity assist;From chair/3-in-1   Sit to Stand Details (indicate cue type and reason) cues on wt shift anteriorly over feet   Stand to Sit 5: Supervision;With upper extremity assist;To chair/3-in-1   Stand to Sit Details cues on positioning prior to sitting   Ambulation/Gait   Ambulation/Gait Yes   Ambulation/Gait Assistance 5: Supervision   Ambulation/Gait Assistance Details verbal / tactile cues on posture, carrying cup of water &/or plate, step width   Ambulation Distance  (Feet) 750 Feet  750' with 2 crutches, 350' w/ 1 crutch, 100'x2 cane, 50'none   Assistive device Prostheses;Lofstrands;Straight cane   Gait Pattern Step-through pattern;Decreased stride length;Trunk flexed   Ambulation Surface Indoor;Level;Outdoor;Unlevel;Gravel;Grass   Stairs Yes   Stairs Assistance 5: Supervision   Stairs Assistance Details (indicate cue type and reason) cues on wt shift & step width   Stair Management Technique One rail Right;With crutches;Step to pattern;Forwards  crutch on left,    Number of Stairs 4  3 reps   Ramp 5: Supervision  with forearm crutch / prostheses   Ramp Details (indicate cue type and reason) cues on positioning feet & wt shift   Curb 5: Supervision  with forearm crutch / prostheses  Curb Details (indicate cue type and reason) posture & wt shift   High Level Balance   High Level Balance Activities Side stepping;Backward walking  with forearm crutch & prostheses   High Level Balance Comments cues on wt shift over stance limb and posture   Prosthetics   Prosthetic Care Comments  sweating indications & drying limb /liner   Current prosthetic wear tolerance (days/week)  daily   Current prosthetic wear tolerance (#hours/day)  ~90% of awake hours   Residual limb condition  intact both legs   Donning Prosthesis Modified independent (device/increased time)   Doffing Prosthesis Modified independent (device/increased time)                PT Education - 09/30/14 1100    Education provided Yes   Education Details varying 2 crutches, 1 crutch, cane, no device based on terrain, distance, fatigue   Person(s) Educated Patient   Methods Explanation   Comprehension Verbalized understanding          PT Short Term Goals - 08/26/14 1015    PT SHORT TERM GOAL #1   Title wears right prosthesis >90% of awake hours & left prosthesis >75% of awake hours with no skin issues & pain <4/10 ((Target Date: 4th visit after eval)   Baseline 4/27- pt  inconsistent with how long he wears his prostheses, vaires depending on his pain/fatigue level's   Status Partially Met   PT SHORT TERM GOAL #2   Title verballized proper prosthetic care except cues on adjusting ply socks. ((Target Date: 4th visit after eval)   Baseline 4/27- Independent with care, needing cues on sock management only.   Status Achieved   PT SHORT TERM GOAL #3   Title ambulates 400' with rolling walker & prostheses modified independent. ((Target Date: 4th visit after eval)   Baseline on 08/06/14   Status Achieved   PT SHORT TERM GOAL #4   Title negotiates ramp & curb with rolling walker and stairs with 2 rails with bilateral prostheses with supervision. ((Target Date: 4th visit after eval)   Baseline on 08/05/14   Status Achieved   PT SHORT TERM GOAL #5   Title stands without UE support 1 minute & reaches 4" with supervision. ((Target Date: 4th visit after eval)   Baseline on 08/06/14   Status Achieved           PT Long Term Goals - 06/19/14 0930    PT LONG TERM GOAL #1   Title tolerates wear of bilateral prostheses >90% of awake hours without skin issues & pain </= 2/10 (Target Date: 8th visit after eval)   Baseline He has progressed wear of bilateral prostheses to 8 hours straight but pain up to 8/10 and skin is too wet placing at risk of breakdown.   Status New   PT LONG TERM GOAL #2   Title independent with prosthetic care. (Target Date: 8th visit after eval)   Baseline Patient is dependent in prosthetic care.   Status New   PT LONG TERM GOAL #3   Title ambulates 500' with LRAD & prostheses modified independent. (Target Date: 8th visit after eval)   Baseline Patient ambulates 100' with rolling walker & prostheses with supervision / cues for safety.   Status New   PT LONG TERM GOAL #4   Title negotiates ramp, curb & stairs with LRAD & prostheses modified independent. (Target Date: 8th visit after eval)   Baseline Patient is non-ambulatory with bilateral  prostheses on barriers.  Status New   PT LONG TERM GOAL #5   Title Berg Balance >36/56 (Target Date: 8th visit after eval)   Baseline Berg Balance 9/56   Status New               Plan - 09/30/14 1100    Clinical Impression Statement Patient understands recommendation for current function level at no device household, cane or 1 crutch limited community and 2 crutches for full community especially grass or uneven terrain. Patient is on target for discharge next visit.   Pt will benefit from skilled therapeutic intervention in order to improve on the following deficits Abnormal gait;Decreased activity tolerance;Decreased balance;Decreased knowledge of use of DME;Decreased mobility;Decreased range of motion;Other (comment)  prosthetic dependency   Rehab Potential Good   PT Frequency 1x / week   PT Duration 8 weeks   PT Treatment/Interventions ADLs/Self Care Home Management;DME Instruction;Gait training;Stair training;Functional mobility training;Therapeutic activities;Therapeutic exercise;Balance training;Neuromuscular re-education;Patient/family education;Other (comment)  prosthetic training   PT Next Visit Plan assess for discharge,  monitor BP   Consulted and Agree with Plan of Care Patient        Problem List Patient Active Problem List   Diagnosis Date Noted  . Type 2 diabetes mellitus with hyperosmolarity without nonketotic hyperglycemic-hyperosmolar coma (nkhhc) 09/14/2014  . Uncontrolled type 2 DM with hyperosmolar nonketotic hyperglycemia 09/14/2014  . Phantom limb pain 06/17/2014  . CN (constipation) 05/26/2014  . Erectile dysfunction 05/26/2014  . Status post below knee amputation of right lower extremity 03/23/2014  . Anemia, iron deficiency 02/04/2014  . History of left below knee amputation 01/21/2014  . Below knee amputation status 12/12/2013  . S/P Right BKA  10/09/2012  . Hyperlipedemia 06/24/2012  . GERD (gastroesophageal reflux disease) 03/13/2012  .  Depression 09/05/2011  . Marginal Housing 03/28/2011  . Essential hypertension 09/22/2008  . DIABETIC PERIPHERAL NEUROPATHY 07/21/2008  . CKD Stage 2 07/21/2008  . TOBACCO ABUSE 01/03/2006  . DRUG ABUSE 01/03/2006  . Diabetes mellitus type 2, uncontrolled, with complications 63/89/3734    Jamey Reas PT, DPT 09/30/2014, 10:22 PM  [facility] [address] [place], [place], [address] Phone: [phone]   Fax:  [phone]

## 2014-10-01 ENCOUNTER — Other Ambulatory Visit: Payer: Self-pay | Admitting: *Deleted

## 2014-10-01 DIAGNOSIS — Z89511 Acquired absence of right leg below knee: Secondary | ICD-10-CM

## 2014-10-01 DIAGNOSIS — G546 Phantom limb syndrome with pain: Secondary | ICD-10-CM

## 2014-10-01 NOTE — Telephone Encounter (Signed)
Also refill for Lisinopril, last filled 09/29/14.  Has this been d/c??  Looks like he is still taking it.

## 2014-10-01 NOTE — Telephone Encounter (Signed)
Received a refill request for Tylenol #3 from pharmacy. Rx was written on 4/18 for # 60 with 3 refills.  The directions say 1 - 2 tablets every 4 hours for pain.  With these directions by can refill every 5 days !! He just picked up his last refill so request if for another Rx.  Do you want to increase the # a month or states # 60 has to last 30 days?

## 2014-10-06 ENCOUNTER — Encounter: Payer: Self-pay | Admitting: *Deleted

## 2014-10-06 MED ORDER — ACETAMINOPHEN-CODEINE #3 300-30 MG PO TABS: 1.0000 | ORAL_TABLET | ORAL | Status: DC | PRN

## 2014-10-06 NOTE — Telephone Encounter (Signed)
Pt has a scheduled appointment 7/1 Rx refill called in

## 2014-10-06 NOTE — Progress Notes (Unsigned)
Request for refill on Lisinopril from pharmacy.  Last given to pt on 6/21.  This med was stopped on hospital d/c on 6/8 along with HCTZ. Pt has appointment on Friday this week.  Please review meds with pt. I could not reach by phone.

## 2014-10-08 ENCOUNTER — Telehealth: Payer: Self-pay | Admitting: Internal Medicine

## 2014-10-08 NOTE — Telephone Encounter (Signed)
Call to patient to confirm appointment for 10/09/14 at 1:30 phones are unavailable

## 2014-10-09 ENCOUNTER — Ambulatory Visit (INDEPENDENT_AMBULATORY_CARE_PROVIDER_SITE_OTHER): Payer: Medicaid Other | Admitting: Internal Medicine

## 2014-10-09 ENCOUNTER — Other Ambulatory Visit: Payer: Self-pay | Admitting: Internal Medicine

## 2014-10-09 ENCOUNTER — Encounter: Payer: Self-pay | Admitting: Internal Medicine

## 2014-10-09 VITALS — BP 142/77 | HR 102 | Temp 98.6°F | Ht 69.0 in | Wt 214.1 lb

## 2014-10-09 DIAGNOSIS — Z794 Long term (current) use of insulin: Secondary | ICD-10-CM | POA: Diagnosis not present

## 2014-10-09 DIAGNOSIS — IMO0002 Reserved for concepts with insufficient information to code with codable children: Secondary | ICD-10-CM

## 2014-10-09 DIAGNOSIS — N182 Chronic kidney disease, stage 2 (mild): Secondary | ICD-10-CM | POA: Diagnosis not present

## 2014-10-09 DIAGNOSIS — F329 Major depressive disorder, single episode, unspecified: Secondary | ICD-10-CM | POA: Diagnosis not present

## 2014-10-09 DIAGNOSIS — K2100 Gastro-esophageal reflux disease with esophagitis, without bleeding: Secondary | ICD-10-CM

## 2014-10-09 DIAGNOSIS — I129 Hypertensive chronic kidney disease with stage 1 through stage 4 chronic kidney disease, or unspecified chronic kidney disease: Secondary | ICD-10-CM | POA: Diagnosis not present

## 2014-10-09 DIAGNOSIS — E1165 Type 2 diabetes mellitus with hyperglycemia: Secondary | ICD-10-CM

## 2014-10-09 DIAGNOSIS — I10 Essential (primary) hypertension: Secondary | ICD-10-CM

## 2014-10-09 DIAGNOSIS — D509 Iron deficiency anemia, unspecified: Secondary | ICD-10-CM

## 2014-10-09 DIAGNOSIS — E1122 Type 2 diabetes mellitus with diabetic chronic kidney disease: Secondary | ICD-10-CM

## 2014-10-09 DIAGNOSIS — Z79899 Other long term (current) drug therapy: Secondary | ICD-10-CM

## 2014-10-09 DIAGNOSIS — E118 Type 2 diabetes mellitus with unspecified complications: Secondary | ICD-10-CM

## 2014-10-09 DIAGNOSIS — F32A Depression, unspecified: Secondary | ICD-10-CM

## 2014-10-09 LAB — GLUCOSE, CAPILLARY: Glucose-Capillary: 174 mg/dL — ABNORMAL HIGH (ref 65–99)

## 2014-10-09 MED ORDER — METFORMIN HCL 500 MG PO TABS: 500.0000 mg | ORAL_TABLET | Freq: Every day | ORAL | Status: DC

## 2014-10-09 MED ORDER — INSULIN GLARGINE 100 UNIT/ML ~~LOC~~ SOLN: 45.0000 [IU] | Freq: Every day | SUBCUTANEOUS | Status: DC

## 2014-10-09 NOTE — Assessment & Plan Note (Addendum)
  His T2Dm is poorly controlled. He is compliant with his Lantus 44 units daily. But he says he rarely takes the aspart as he skips meals often time. Last A1C was 13.5 on 09/13/14, and the the average bloood sugar was 233.  Plan is to ADD Metformin 500 mg daily at breakfast . Also, Split lantus to 25 units in AM, and 20 units in PM, 12 hours apart for better control of blood sugar I also advised him to not take aspart . I also explained to him symptoms of hypoglycemia since his log showed a low of 52 and what to do incase that happens. I also advised him that we will add lisinopril if his renal fx is improving for renoprotective mechanism in the presence of t2dm. -Check BMP today

## 2014-10-09 NOTE — Assessment & Plan Note (Signed)
Stable. Continue current dose of prozac

## 2014-10-09 NOTE — Assessment & Plan Note (Signed)
His EGD last month revealed distal esophagitis.  He has not had any bouts of hematemesis, or vomiting since he was d/c from the hospital. I encouraged him to continue iron, PPI and carafate.  We have also ordered CBC to check his HgB level.

## 2014-10-09 NOTE — Progress Notes (Signed)
Patient ID: [name], male   DOB: [birth year], 49 y.o.   MRN: [id number]   Subjective:   Patient ID: [name] male   DOB: [birth year] 49 y.o.   MRN: [id number]  HPI: [name] is a pleasant 49 y.o. Af-Am man, with notable PMH of uncontrolled T2DM, HTN, Depression, GERD, and stage 2 CKD. He is here for a follow up visit in regards to his blood pressure and diabetes medications. He says that he is doing well. He recently got approved for disability, but still has a hard time with eating regularly as he only eats once a day.    Type 2 Diabetes: He brought his blood glucose log, which I reviewed with him. He is compliant with his Lantus. But he rarely takes the aspart as he only eats one meal a day. His POCT glucose in the room today was in the 170s. His log shows that glucose was a high of 481, and low of 52, with average being 233, which is still significantly high. His last A1C was 13.5 in June.  He said that he has spoken in the past with the diabetes educator but does not desire to speak with her today.   HTN: Prior to his hospital admission, he was on lisinopril and HCTZ, but they were discontinued due to AKI on CKD. He is currently on amlodipine 10 mg, and BP was 142/77. He is compliant with amlodipine. We will recheck his BMP  GERD: He has not reported any symptoms of hematemesis or vomiting. Currently intermittently takes iron, and PPI. We will recheck his CBC for Hgb level.    Past Medical History  Diagnosis Date  . Onychomycosis   . Hypertension   . Diabetic peripheral neuropathy   . Diabetic foot ulcer     s/p Right first ray amputation, left transmetatarsal amputation with revision  . History of drug abuse     Cocaine and marijuana  . Exposure to trichomonas     treated empirically  . Cellulitis of left foot 03/2008    left 4th and 5th metatarsal area  . Hyperlipidemia   . Alcoholic pancreatitis 05/5425  . Ulcerative esophagitis 0623    severe, complicated with  UGI bleed  . Mallory [name] tear April 2009  . History of prolonged Q-T interval on ECG   . 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  . 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 CKD (chronic kidney disease) stage 2, GFR 60-89 ml/min     BL SCr 1.3-1.4  . Depression   . Peripheral vascular disease   . Type 2 diabetes mellitus, uncontrolled, with renal complications   . GERD (gastroesophageal reflux disease)   . 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 Cellulitis and abscess of leg 01/31/2014  . Osteomyelitis of left leg 01/21/2014  . History of left below knee amputation 01/21/2014    Performed on    . Status post below knee amputation of right lower extremity 03/23/2014   Current Outpatient Prescriptions  Medication Sig Dispense Refill  . ACCU-CHEK FASTCLIX LANCETS MISC  1 each by Does not apply route 4 (four) times daily -  before meals and at bedtime. 102 each 11  . acetaminophen-codeine (TYLENOL #3) 300-30 MG per tablet Take 1-2 tablets by mouth every 4 (four) hours as needed for moderate pain. 60 tablet 0  . amLODipine (NORVASC) 10 MG tablet Take 0.5 tablets (5 mg total) by mouth daily. 30 tablet 2  . ferrous sulfate 325 (65 FE) MG tablet Take 1 tablet (325 mg total) by mouth 3 (three) times daily with meals. 90 tablet 3  . FLUoxetine (PROZAC) 10 MG capsule Take 1 capsule (10 mg total) by mouth daily. 30 capsule 6  . gabapentin (NEURONTIN) 600 MG tablet Take 1 tablet (600 mg total) by mouth 3 (three) times daily. 90 tablet 6  . glucose blood (ACCU-CHEK SMARTVIEW) test strip 1 each by Other route 4 (four) times daily -   before meals and at bedtime. Use as instructed 100 each 12  . insulin aspart (NOVOLOG) 100 UNIT/ML injection Inject 5 Units into the skin 3 (three) times daily with meals. 10 mL 11  . insulin glargine (LANTUS) 100 UNIT/ML injection Inject 0.44 mLs (44 Units total) into the skin every morning. 10 mL 6  . pantoprazole (PROTONIX) 40 MG tablet Take 1 tablet (40 mg total) by mouth 2 (two) times daily. 60 tablet 3  . polyethylene glycol (MIRALAX / GLYCOLAX) packet Take 17 g by mouth daily. 14 each 3  . sildenafil (REVATIO) 20 MG tablet Take 2-5 tablets before sexual activity. 50 tablet 1  . feeding supplement, GLUCERNA SHAKE, (GLUCERNA SHAKE) LIQD Take 237 mLs by mouth 2 (two) times daily between meals. (Patient not taking: Reported on 11/14/5641) 60 Can 3  . folic acid (FOLVITE) 1 MG tablet Take 1 tablet (1 mg total) by mouth daily. (Patient not taking: Reported on 10/09/2014) 30 tablet 3  . metFORMIN (GLUCOPHAGE) 500 MG tablet Take 1 tablet (500 mg total) by mouth daily with breakfast. 30 tablet 11  . nicotine (NICODERM CQ - DOSED IN MG/24 HOURS) 14 mg/24hr patch Place 1 patch onto the skin daily. (Patient not taking: Reported on 10/09/2014) 28 patch 0  . sucralfate (CARAFATE) 1 GM/10ML suspension Take 10 mLs (1 g total) by mouth 4 (four) times daily -  with meals and at bedtime. (Patient not taking: Reported on 10/09/2014) 420 mL 0   No current facility-administered medications for this visit.  r Family History  Problem Relation Age of Onset  . Diabetes type II Mother   . Pancreatic cancer Father    History   Social History  . Marital Status: Married    Spouse Name: N/A  . Number of Children: N/A  . Years of Education: 12   Occupational History  . disabled    Social History Main Topics  . Smoking status: Current Every Day Smoker -- 2.00 packs/day for 34 years    Types: Cigarettes, Cigars  . Smokeless tobacco: Never Used     Comment: 1/2 PPD  . Alcohol Use: 0.0 oz/week    0 Standard drinks or  equivalent per week     Comment: occ  . Drug Use: No     Comment: NO LONGER USES-GRADUATING FROM GE[employer] CRE AFTER  3-4 MONTHS                                                                                                                                       .  Sexual Activity: Not on file   Other Topics Concern  . Not on file   Social History Narrative   Review of Systems: Review of Systems  Constitutional: Negative.  Negative for fever, weight loss and malaise/fatigue.  HENT: Negative.   Eyes: Negative.   Respiratory: Negative.   Cardiovascular: Negative for chest pain.  Gastrointestinal: Negative.  Negative for nausea, vomiting and blood in stool.  Musculoskeletal: Negative.   Skin: Negative.   Psychiatric/Behavioral: Negative for depression.     Objective:  Physical Exam: BP 142/77 mmHg  Pulse 102  Temp(Src) 98.6 F (37 C) (Oral)  Ht 5\' 9"  (1.753 m)  Wt 214 lb 1.6 oz (97.115 kg)  BMI 31.60 kg/m2  SpO2 100% BP Readings from Last 3 Encounters:  10/09/14 142/77  09/30/14 168/85  09/23/14 162/78    Physical Exam  Constitutional: He is oriented to person, place, and time and well-developed, well-nourished, and in no distress.  HENT:  Head: Normocephalic and atraumatic.  Eyes: Conjunctivae are normal. Pupils are equal, round, and reactive to light.  Neck: Normal range of motion. Neck supple.  Cardiovascular: Normal rate and regular rhythm.   No murmur heard. Pulmonary/Chest: Effort normal and breath sounds normal. He has no wheezes.  Abdominal: Soft. Bowel sounds are normal.  Neurological: He is alert and oriented to person, place, and time. No cranial nerve deficit.  Skin: No erythema.  Psychiatric: Affect and judgment normal.     Assessment & Plan:  Please see problem based A&P Please see problem based charting for assessment and plan.

## 2014-10-09 NOTE — Assessment & Plan Note (Signed)
Checked hgb today

## 2014-10-09 NOTE — Assessment & Plan Note (Signed)
HTN: his hypertension is better since he was started on 10mg  amlodipine, and he takes it regularly.  BP Readings from Last 3 Encounters:  10/09/14 142/77  09/30/14 168/85  09/23/14 162/78   We have re-checked his BMP, and if his Cr fx returns to baseline or is improving, we will start him on 20mg  lisinopril qd along with amlodipine.   I also encouraged him to restrict salt in his diet, and explained him DASH diet.

## 2014-10-09 NOTE — Patient Instructions (Signed)
Thank you for your visit today. A. For your diabetes, we initiated the following changes. 1) Split Lantus to twice a day dosing, . Take 25 units in AM, and 20 units in evening, 12 hours apart  2) Add metformin 500 mg in the AM with meal, once a day  3) Please continue to work on eating healthy diet, and incorporating more exercise into your daily routine  4) We also discussed low blood sugar symptoms, and what to do.   5) Please bring your blood sugar logs and readings at each visit  B. For your blood pressure  We have ordered the blood work, if your kidney function is returning to normal, I will call you to start lisinopril again, and will send the order to the pharmacy Please continue the amlodipine 10 mg as prescribed  C. Reminder to get Tdap vaccine next time  We will see you in 2 months    Metformin tablets What is this medicine? METFORMIN (met FOR min) is used to treat type 2 diabetes. It helps to control blood sugar. Treatment is combined with diet and exercise. This medicine can be used alone or with other medicines for diabetes. This medicine may be used for other purposes; ask your health care provider or pharmacist if you have questions. COMMON BRAND NAME(S): Glucophage What should I tell my health care provider before I take this medicine? They need to know if you have any of these conditions: -anemia -frequently drink alcohol-containing beverages -become easily dehydrated -heart attack -heart failure that is treated with medications -kidney disease -liver disease -polycystic ovary syndrome -serious infection or injury -vomiting -an unusual or allergic reaction to metformin, other medicines, foods, dyes, or preservatives -pregnant or trying to get pregnant -breast-feeding How should I use this medicine? Take this medicine by mouth. Take it with meals. Swallow the tablets with a drink of water. Follow the directions on the prescription label. Take your medicine  at regular intervals. Do not take your medicine more often than directed. Talk to your pediatrician regarding the use of this medicine in children. While this drug may be prescribed for children as young as 29 years of age for selected conditions, precautions do apply. Overdosage: If you think you have taken too much of this medicine contact a poison control center or emergency room at once. NOTE: This medicine is only for you. Do not share this medicine with others. What if I miss a dose? If you miss a dose, take it as soon as you can. If it is almost time for your next dose, take only that dose. Do not take double or extra doses. What may interact with this medicine? Do not take this medicine with any of the following medications: -dofetilide -gatifloxacin -certain contrast medicines given before X-rays, CT scans, MRI, or other procedures This medicine may also interact with the following medications: -digoxin -diuretics -male hormones, like estrogens or progestins and birth control pills -isoniazid -medicines for blood pressure, heart disease, irregular heart beat -morphine -nicotinic acid -phenothiazines like chlorpromazine, mesoridazine, prochlorperazine, thioridazine -phenytoin -procainamide -quinidine -quinine -ranitidine -steroid medicines like prednisone or cortisone -stimulant medicines for attention disorders, weight loss, or to stay awake -thyroid medicines -trimethoprim -vancomycin This list may not describe all possible interactions. Give your health care provider a list of all the medicines, herbs, non-prescription drugs, or dietary supplements you use. Also tell them if you smoke, drink alcohol, or use illegal drugs. Some items may interact with your medicine. What should I watch for  while using this medicine? Visit your doctor or health care professional for regular checks on your progress. A test called the HbA1C (A1C) will be monitored. This is a simple blood  test. It measures your blood sugar control over the last 2 to 3 months. You will receive this test every 3 to 6 months. Learn how to check your blood sugar. Learn the symptoms of low and high blood sugar and how to manage them. Always carry a quick-source of sugar with you in case you have symptoms of low blood sugar. Examples include hard sugar candy or glucose tablets. Make sure others know that you can choke if you eat or drink when you develop serious symptoms of low blood sugar, such as seizures or unconsciousness. They must get medical help at once. Tell your doctor or health care professional if you have high blood sugar. You might need to change the dose of your medicine. If you are sick or exercising more than usual, you might need to change the dose of your medicine. Do not skip meals. Ask your doctor or health care professional if you should avoid alcohol. Many nonprescription cough and cold products contain sugar or alcohol. These can affect blood sugar. This medicine may cause ovulation in premenopausal women who do not have regular monthly periods. This may increase your chances of becoming pregnant. You should not take this medicine if you become pregnant or think you may be pregnant. Talk with your doctor or health care professional about your birth control options while taking this medicine. Contact your doctor or health care professional right away if think you are pregnant. If you are going to need surgery, a MRI, CT scan, or other procedure, tell your doctor that you are taking this medicine. You may need to stop taking this medicine before the procedure. Wear a medical ID bracelet or chain, and carry a card that describes your disease and details of your medicine and dosage times. What side effects may I notice from receiving this medicine? Side effects that you should report to your doctor or health care professional as soon as possible: -allergic reactions like skin rash, itching or  hives, swelling of the face, lips, or tongue -breathing problems -feeling faint or lightheaded, falls -muscle aches or pains -signs and symptoms of low blood sugar such as feeling anxious, confusion, dizziness, increased hunger, unusually weak or tired, sweating, shakiness, cold, irritable, headache, blurred vision, fast heartbeat, loss of consciousness -slow or irregular heartbeat -unusual stomach pain or discomfort -unusually tired or weak Side effects that usually do not require medical attention (report to your doctor or health care professional if they continue or are bothersome): -diarrhea -headache -heartburn -metallic taste in mouth -nausea -stomach gas, upset This list may not describe all possible side effects. Call your doctor for medical advice about side effects. You may report side effects to FDA at 1-800-FDA-1088. Where should I keep my medicine? Keep out of the reach of children. Store at room temperature between 15 and 30 degrees C (59 and 86 degrees F). Protect from moisture and light. Throw away any unused medicine after the expiration date. NOTE: This sheet is a summary. It may not cover all possible information. If you have questions about this medicine, talk to your doctor, pharmacist, or health care provider.  2015, Elsevier/Gold Standard. (2012-07-09 16:03:44)

## 2014-10-10 LAB — CBC
HCT: 31.8 % — ABNORMAL LOW (ref 39.0–52.0)
Hemoglobin: 10.7 g/dL — ABNORMAL LOW (ref 13.0–17.0)
MCH: 29.5 pg (ref 26.0–34.0)
MCHC: 33.6 g/dL (ref 30.0–36.0)
MCV: 87.6 fL (ref 78.0–100.0)
MPV: 9.4 fL (ref 8.6–12.4)
Platelets: 471 10*3/uL — ABNORMAL HIGH (ref 150–400)
RBC: 3.63 MIL/uL — ABNORMAL LOW (ref 4.22–5.81)
RDW: 14.2 % (ref 11.5–15.5)
WBC: 8.5 10*3/uL (ref 4.0–10.5)

## 2014-10-10 LAB — BASIC METABOLIC PANEL WITH GFR
BUN: 17 mg/dL (ref 6–23)
CO2: 25 meq/L (ref 19–32)
CREATININE: 1.36 mg/dL — AB (ref 0.50–1.35)
Calcium: 9.4 mg/dL (ref 8.4–10.5)
Chloride: 100 mEq/L (ref 96–112)
GFR, EST AFRICAN AMERICAN: 70 mL/min
GFR, Est Non African American: 61 mL/min
GLUCOSE: 180 mg/dL — AB (ref 70–99)
Potassium: 4.6 mEq/L (ref 3.5–5.3)
Sodium: 138 mEq/L (ref 135–145)

## 2014-10-13 ENCOUNTER — Ambulatory Visit: Payer: Medicaid Other | Attending: Orthopedic Surgery | Admitting: Physical Therapy

## 2014-10-13 ENCOUNTER — Encounter: Payer: Self-pay | Admitting: Physical Therapy

## 2014-10-13 DIAGNOSIS — Z7409 Other reduced mobility: Secondary | ICD-10-CM | POA: Diagnosis present

## 2014-10-13 DIAGNOSIS — Z89511 Acquired absence of right leg below knee: Secondary | ICD-10-CM | POA: Insufficient documentation

## 2014-10-13 DIAGNOSIS — Z89512 Acquired absence of left leg below knee: Secondary | ICD-10-CM | POA: Diagnosis present

## 2014-10-13 DIAGNOSIS — R269 Unspecified abnormalities of gait and mobility: Secondary | ICD-10-CM | POA: Diagnosis present

## 2014-10-13 NOTE — Therapy (Signed)
[facility] [address] [place], [place], [address] Phone: [phone]   Fax:  [phone]  Physical Therapy Treatment  Patient Details  Name: [name] MRN: [id number] Date of Birth: 20-Oct-1965 Referring Provider:  [name], MD  Encounter Date: 10/13/2014      PT End of Session - 10/13/14 1301    Visit Number 9   Number of Visits 9   Authorization Type Medicaid   Authorization Time Period 8 visits 06/24/14 - 10/13/14   Authorization - Visit Number 8   Authorization - Number of Visits 8   PT Start Time 3546   PT Stop Time 0928   PT Time Calculation (min) 33 min   Equipment Utilized During Treatment Gait belt      Past Medical History  Diagnosis Date  . Onychomycosis   . Hypertension   . Diabetic peripheral neuropathy   . Diabetic foot ulcer     s/p Right first ray amputation, left transmetatarsal amputation with revision  . History of drug abuse     Cocaine and marijuana  . Exposure to trichomonas     treated empirically  . Cellulitis of left foot 03/2008    left 4th and 5th metatarsal area  . Hyperlipidemia   . 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 Ulcerative esophagitis 5170    severe, complicated with UGI bleed  . Mallory [name] tear April 2009  . History of prolonged Q-T interval on ECG   . 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  . 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 CKD (chronic kidney disease) stage 2, GFR 60-89 ml/min     BL SCr 1.3-1.4  . Depression   . Peripheral vascular disease   . Type 2 diabetes mellitus, uncontrolled, with renal complications   . GERD (gastroesophageal reflux disease)   . 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 Recurrent Foot Osteomyelitis 02/19/2012    Recurrent osteomyelitis of toes  with multiple ray amputations Follows up with Dr Sharol Given   . 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 Cellulitis and abscess of leg 01/31/2014  . Osteomyelitis of left leg 01/21/2014  . History of left below knee amputation 01/21/2014    Performed on    . Status post below knee amputation of right lower extremity 03/23/2014    Past Surgical History  Procedure Laterality Date  . Toe amputation  06/2011    left; 4th toe  . Nephrectomy      L side  . I&d extremity  08/24/2011    Procedure: IRRIGATION AND DEBRIDEMENT EXTREMITY;  Surgeon: Meredith Pel, MD;  Location: [facility];  Service: Orthopedics;  Laterality: Left;  . Amputation  08/24/2011    Procedure: AMPUTATION RAY;  Surgeon: Meredith Pel, MD;  Location: [facility];  Service: Orthopedics;  Laterality: Left;  left fourth toe Ray Resection  . Amputation  12/05/2011    Procedure: AMPUTATION RAY;  Surgeon: Meredith Pel, MD;  Location: [facility];  Service: Orthopedics;  Laterality: Left;  . I&d extremity  02/18/2012    Procedure: IRRIGATION AND DEBRIDEMENT EXTREMITY;  Surgeon: Mcarthur Rossetti, MD;  Location: [facility];  Service: Orthopedics;  Laterality: Left;  . Amputation  02/18/2012    Procedure: AMPUTATION  DIGIT;  Surgeon: Mcarthur Rossetti, MD;  Location: [facility];  Service: Orthopedics;  Laterality: Left;  revison transmetatarsal  . I&d extremity Left 08/28/2012    Procedure: IRRIGATION AND DEBRIDEMENT EXTREMITY;  Surgeon: Linna Hoff, MD;  Location: [facility];  Service: Orthopedics;  Laterality: Left;  CYSTO TUBING/IRRIGATION, LEAD HAND.  . I&d extremity Left 08/30/2012    Procedure: IRRIGATION AND DEBRIDEMENT EXTREMITY;  Surgeon: Linna Hoff, MD;  Location: [facility];  Service: Orthopedics;  Laterality: Left;  . Amputation Right 09/01/2012    Procedure: FOOT 1ST RAY AMPUTATION;  Surgeon: Newt Minion, MD;  Location: [facility];  Service: Orthopedics;  Laterality: Right;  . Amputation Left 09/01/2012    Procedure:  FOOT REVISION TRANSMETATARSAL AMPUTATION ;  Surgeon: Newt Minion, MD;  Location: [facility];  Service: Orthopedics;  Laterality: Left;  . Amputation Right 09/13/2012    Procedure: AMPUTATION BELOW KNEE;  Surgeon: Newt Minion, MD;  Location: [facility];  Service: Orthopedics;  Laterality: Right;  Right Below Knee Amputation  . Amputation Left 12/12/2013    Procedure: Left Below Knee Amputation;  Surgeon: Newt Minion, MD;  Location: [facility];  Service: Orthopedics;  Laterality: Left;  . Stump revision Left 01/23/2014    Procedure: STUMP REVISION;  Surgeon: Newt Minion, MD;  Location: [facility];  Service: Orthopedics;  Laterality: Left;  . Esophagogastroduodenoscopy (egd) with propofol N/A 09/16/2014    Procedure: ESOPHAGOGASTRODUODENOSCOPY (EGD) WITH PROPOFOL;  Surgeon: Wonda Horner, MD;  Location[facility] ENDOSCOPY;  Service: Endoscopy;  Laterality: N/A;    There were no vitals filed for this visit.  Visit Diagnosis:  Status post bilateral below knee amputation  Abnormality of gait  Impaired functional mobility and activity tolerance      Subjective Assessment - 10/13/14 1254    Subjective Pt. reports legs are sore with use of prostheses due to having walked alot over the weekend   Patient is accompained by: Family member   Patient Stated Goals To use prostheses to walk in community to keep up with teenage children and grandchild on way.   Currently in Pain? No/denies            [facility] PT Assessment - 10/13/14 0910    Berg Balance Test   Sit to Stand Able to stand  independently using hands   Standing Unsupported Able to stand safely 2 minutes   Sitting with Back Unsupported but Feet Supported on Floor or Stool Able to sit safely and securely 2 minutes   Stand to Sit Sits safely with minimal use of hands   Transfers Able to transfer safely, minor use of hands   Standing Unsupported with Eyes Closed Needs help to keep from falling   Standing Ubsupported with Feet Together Able to place feet  together independently and stand for 1 minute with supervision   From Standing, Reach Forward with Outstretched Arm Can reach forward >12 cm safely (5")   From Standing Position, Pick up Object from Floor Unable to try/needs assist to keep balance   From Standing Position, Turn to Look Behind Over each Shoulder Turn sideways only but maintains balance   Turn 360 Degrees Needs close supervision or verbal cueing   Standing Unsupported, Alternately Place Feet on Step/Stool Able to complete >2 steps/needs minimal assist   Standing Unsupported, One Foot in Front Loses balance while stepping or standing   Standing on One Leg Unable to try or needs assist to prevent fall   Total Score  [facility] Adult PT Treatment/Exercise - 10/13/14 1256    Ambulation/Gait   Ambulation/Gait Yes   Ambulation/Gait Assistance 6: Modified independent (Device/Increase time)   Ambulation Distance (Feet) 250 Feet   Assistive device Lofstrands   Gait Pattern Step-through pattern;Decreased stride length;Trunk flexed   Stairs Yes   Stair Management Technique One rail Left  with Lofstrand crutch in RUE   Number of Stairs 4   Height of Stairs 6   Ramp 6: Modified independent (Device)   Curb 6: Modified independent (Device/increase time)                  PT Short Term Goals - 08/26/14 1015    PT SHORT TERM GOAL #1   Title wears right prosthesis >90% of awake hours & left prosthesis >75% of awake hours with no skin issues & pain <4/10 ((Target Date: 4th visit after eval)   Baseline 4/27- pt inconsistent with how long he wears his prostheses, vaires depending on his pain/fatigue level's   Status Partially Met   PT SHORT TERM GOAL #2   Title verballized proper prosthetic care except cues on adjusting ply socks. ((Target Date: 4th visit after eval)   Baseline 4/27- Independent with care, needing cues on sock management only.   Status Achieved   PT SHORT TERM GOAL #3   Title  ambulates 400' with rolling walker & prostheses modified independent. ((Target Date: 4th visit after eval)   Baseline on 08/06/14   Status Achieved   PT SHORT TERM GOAL #4   Title negotiates ramp & curb with rolling walker and stairs with 2 rails with bilateral prostheses with supervision. ((Target Date: 4th visit after eval)   Baseline on 08/05/14   Status Achieved   PT SHORT TERM GOAL #5   Title stands without UE support 1 minute & reaches 4" with supervision. ((Target Date: 4th visit after eval)   Baseline on 08/06/14   Status Achieved           PT Long Term Goals - 10/13/14 1301    PT LONG TERM GOAL #1   Title tolerates wear of bilateral prostheses >90% of awake hours without skin issues & pain </= 2/10 (Target Date: 8th visit after eval)   Status Achieved   PT LONG TERM GOAL #2   Title independent with prosthetic care. (Target Date: 8th visit after eval)   Status Achieved   PT LONG TERM GOAL #3   Title ambulates 500' with LRAD & prostheses modified independent. (Target Date: 8th visit after eval)   Baseline pt. unable to amb. 800' due to discomfort in LE's due to having walked alot over the weekend; pt. is modified independent with ambulation with crutches   Status Partially Met   PT LONG TERM GOAL #4   Title negotiates ramp, curb & stairs with LRAD & prostheses modified independent. (Target Date: 8th visit after eval)   Baseline with crutches; with rail and 1 Lofstrand crutch with step negotiation   Status Achieved   PT LONG TERM GOAL #5   Title Berg Balance >36/56 (Target Date: 8th visit after eval)   Baseline Berg score 29/56   Status Not Met               Plan - 10/13/14 1306    Clinical Impression Statement Pt. met LTG's #1,2,4; #3 partially met due to pt's inabiltiy to  amb. 500' today due to discomfort with prostheses; #5 not met due to continued blaance deficits   PT Frequency 1x / week   PT Duration 8 weeks   PT Home Exercise Plan mid-line HEP, seated 4  point sequencing    Consulted and Agree with Plan of Care Patient;Family member/caregiver   Family Member Consulted wife        Problem List Patient Active Problem List   Diagnosis Date Noted  . Type 2 diabetes mellitus with hyperosmolarity without nonketotic hyperglycemic-hyperosmolar coma (nkhhc) 09/14/2014  . Uncontrolled type 2 DM with hyperosmolar nonketotic hyperglycemia 09/14/2014  . Phantom limb pain 06/17/2014  . CN (constipation) 05/26/2014  . Erectile dysfunction 05/26/2014  . Status post below knee amputation of right lower extremity 03/23/2014  . Anemia, iron deficiency 02/04/2014  . History of left below knee amputation 01/21/2014  . Below knee amputation status 12/12/2013  . S/P Right BKA  10/09/2012  . Hyperlipedemia 06/24/2012  . GERD (gastroesophageal reflux disease) 03/13/2012  . Depression 09/05/2011  . Marginal Housing 03/28/2011  . Essential hypertension 09/22/2008  . DIABETIC PERIPHERAL NEUROPATHY 07/21/2008  . CKD Stage 2 07/21/2008  . TOBACCO ABUSE 01/03/2006  . DRUG ABUSE 01/03/2006  . Diabetes mellitus type 2, uncontrolled, with complications 23/70/2301    [place]ildayJenness Corner, PT 10/13/2014, 1:08 PM  [facility] [address] [place], [place], [address] Phone: [phone]   Fax:  [phone]

## 2014-10-13 NOTE — Therapy (Signed)
[facility] [address] [place], [place], [address] Phone: [phone]   Fax:  [phone]  Patient Details  Name: [name] MRN: [id number] Date of Birth: 1965/07/06 Referring Provider:  No ref. provider found  Encounter Date: 10/13/2014  PHYSICAL THERAPY DISCHARGE SUMMARY  Visits from Start of Care: 8  Current functional level related to goals / functional outcomes:     PT Long Term Goals - 10/13/14 1301    PT LONG TERM GOAL #1   Title tolerates wear of bilateral prostheses >90% of awake hours without skin issues & pain </= 2/10 (Target Date: 8th visit after eval)   Status Achieved   PT LONG TERM GOAL #2   Title independent with prosthetic care. (Target Date: 8th visit after eval)   Status Achieved   PT LONG TERM GOAL #3   Title ambulates 500' with LRAD & prostheses modified independent. (Target Date: 8th visit after eval)   Baseline pt. unable to amb. 800' due to discomfort in LE's due to having walked alot over the weekend; pt. is modified independent with ambulation with crutches   Status Partially Met   PT LONG TERM GOAL #4   Title negotiates ramp, curb & stairs with LRAD & prostheses modified independent. (Target Date: 8th visit after eval)   Baseline with crutches; with rail and 1 Lofstrand crutch with step negotiation   Status Achieved   PT LONG TERM GOAL #5   Title Berg Balance >36/56 (Target Date: 8th visit after eval)   Baseline Berg score 29/56   Status Not Met        Remaining deficits: 1. Balance deficits noted by Merrilee Jansky Balance score of 29/56 2. Posture: flexed trunk with wide base 3. Prosthetic Gait: Dependent on crutches for support.   Education / Equipment: Proper prosthetic care. Plan: Patient agrees to discharge.  Patient goals were partially met. Patient is being discharged due to meeting the stated rehab goals.  ?????       Nevan Creighton  PT, DPT  10/13/2014, 5:59 PM  [facility] [address] [place], [place], [address] Phone: [phone]   Fax:  [phone]

## 2014-10-13 NOTE — Progress Notes (Signed)
Internal Medicine Clinic Attending  I saw and evaluated the patient.  I personally confirmed the key portions of the history and exam documented by Dr. Saraiya and I reviewed pertinent patient test results.  The assessment, diagnosis, and plan were formulated together and I agree with the documentation in the resident's note.  

## 2014-10-26 ENCOUNTER — Other Ambulatory Visit: Payer: Self-pay | Admitting: *Deleted

## 2014-10-26 ENCOUNTER — Other Ambulatory Visit: Payer: Self-pay

## 2014-10-26 ENCOUNTER — Observation Stay (HOSPITAL_COMMUNITY)
Admission: EM | Admit: 2014-10-26 | Discharge: 2014-10-28 | Disposition: A | Discharge status: Home / Self Care | Payer: Medicaid Other | Attending: Internal Medicine | Admitting: Internal Medicine

## 2014-10-26 ENCOUNTER — Encounter (HOSPITAL_COMMUNITY): Payer: Self-pay

## 2014-10-26 DIAGNOSIS — Z794 Long term (current) use of insulin: Secondary | ICD-10-CM

## 2014-10-26 DIAGNOSIS — E1122 Type 2 diabetes mellitus with diabetic chronic kidney disease: Secondary | ICD-10-CM

## 2014-10-26 DIAGNOSIS — R112 Nausea with vomiting, unspecified: Secondary | ICD-10-CM

## 2014-10-26 DIAGNOSIS — K5900 Constipation, unspecified: Secondary | ICD-10-CM

## 2014-10-26 DIAGNOSIS — K920 Hematemesis: Principal | ICD-10-CM | POA: Insufficient documentation

## 2014-10-26 DIAGNOSIS — E1100 Type 2 diabetes mellitus with hyperosmolarity without nonketotic hyperglycemic-hyperosmolar coma (NKHHC): Secondary | ICD-10-CM

## 2014-10-26 DIAGNOSIS — E1165 Type 2 diabetes mellitus with hyperglycemia: Secondary | ICD-10-CM | POA: Diagnosis not present

## 2014-10-26 DIAGNOSIS — E1143 Type 2 diabetes mellitus with diabetic autonomic (poly)neuropathy: Secondary | ICD-10-CM

## 2014-10-26 DIAGNOSIS — Z89512 Acquired absence of left leg below knee: Secondary | ICD-10-CM

## 2014-10-26 DIAGNOSIS — IMO0002 Reserved for concepts with insufficient information to code with codable children: Secondary | ICD-10-CM | POA: Diagnosis present

## 2014-10-26 DIAGNOSIS — K219 Gastro-esophageal reflux disease without esophagitis: Secondary | ICD-10-CM

## 2014-10-26 DIAGNOSIS — I129 Hypertensive chronic kidney disease with stage 1 through stage 4 chronic kidney disease, or unspecified chronic kidney disease: Secondary | ICD-10-CM

## 2014-10-26 DIAGNOSIS — Z89511 Acquired absence of right leg below knee: Secondary | ICD-10-CM

## 2014-10-26 DIAGNOSIS — R109 Unspecified abdominal pain: Secondary | ICD-10-CM | POA: Diagnosis not present

## 2014-10-26 DIAGNOSIS — K3184 Gastroparesis: Secondary | ICD-10-CM

## 2014-10-26 DIAGNOSIS — D509 Iron deficiency anemia, unspecified: Secondary | ICD-10-CM

## 2014-10-26 DIAGNOSIS — E119 Type 2 diabetes mellitus without complications: Secondary | ICD-10-CM | POA: Diagnosis not present

## 2014-10-26 DIAGNOSIS — R1013 Epigastric pain: Secondary | ICD-10-CM

## 2014-10-26 DIAGNOSIS — I10 Essential (primary) hypertension: Secondary | ICD-10-CM | POA: Diagnosis present

## 2014-10-26 DIAGNOSIS — E118 Type 2 diabetes mellitus with unspecified complications: Secondary | ICD-10-CM

## 2014-10-26 DIAGNOSIS — N182 Chronic kidney disease, stage 2 (mild): Secondary | ICD-10-CM

## 2014-10-26 DIAGNOSIS — F329 Major depressive disorder, single episode, unspecified: Secondary | ICD-10-CM

## 2014-10-26 DIAGNOSIS — K2210 Ulcer of esophagus without bleeding: Secondary | ICD-10-CM | POA: Diagnosis present

## 2014-10-26 LAB — CBC WITH DIFFERENTIAL/PLATELET
Basophils Absolute: 0 10*3/uL (ref 0.0–0.1)
Basophils Relative: 0 % (ref 0–1)
Eosinophils Absolute: 0 10*3/uL (ref 0.0–0.7)
Eosinophils Relative: 0 % (ref 0–5)
HCT: 37.3 % — ABNORMAL LOW (ref 39.0–52.0)
Hemoglobin: 11.9 g/dL — ABNORMAL LOW (ref 13.0–17.0)
LYMPHS ABS: 0.9 10*3/uL (ref 0.7–4.0)
Lymphocytes Relative: 7 % — ABNORMAL LOW (ref 12–46)
MCH: 28.5 pg (ref 26.0–34.0)
MCHC: 31.9 g/dL (ref 30.0–36.0)
MCV: 89.2 fL (ref 78.0–100.0)
MONO ABS: 0.5 10*3/uL (ref 0.1–1.0)
Monocytes Relative: 4 % (ref 3–12)
Neutro Abs: 11.9 10*3/uL — ABNORMAL HIGH (ref 1.7–7.7)
Neutrophils Relative %: 89 % — ABNORMAL HIGH (ref 43–77)
Platelets: 567 10*3/uL — ABNORMAL HIGH (ref 150–400)
RBC: 4.18 MIL/uL — ABNORMAL LOW (ref 4.22–5.81)
RDW: 13.6 % (ref 11.5–15.5)
WBC: 13.3 10*3/uL — ABNORMAL HIGH (ref 4.0–10.5)

## 2014-10-26 LAB — COMPREHENSIVE METABOLIC PANEL
ALBUMIN: 4.4 g/dL (ref 3.5–5.0)
ALT: 16 U/L — ABNORMAL LOW (ref 17–63)
AST: 25 U/L (ref 15–41)
Alkaline Phosphatase: 117 U/L (ref 38–126)
Anion gap: 14 (ref 5–15)
BUN: 19 mg/dL (ref 6–20)
CHLORIDE: 99 mmol/L — AB (ref 101–111)
CO2: 26 mmol/L (ref 22–32)
CREATININE: 1.36 mg/dL — AB (ref 0.61–1.24)
Calcium: 9.4 mg/dL (ref 8.9–10.3)
GFR calc non Af Amer: 60 mL/min — ABNORMAL LOW (ref >60)
Glucose, Bld: 354 mg/dL — ABNORMAL HIGH (ref 65–99)
Potassium: 3.8 mmol/L (ref 3.5–5.1)
SODIUM: 139 mmol/L (ref 135–145)
Total Bilirubin: 0.8 mg/dL (ref 0.3–1.2)
Total Protein: 8.7 g/dL — ABNORMAL HIGH (ref 6.5–8.1)

## 2014-10-26 LAB — BLOOD GAS, VENOUS
Acid-Base Excess: 2.7 mmol/L — ABNORMAL HIGH (ref 0.0–2.0)
Bicarbonate: 27.2 mEq/L — ABNORMAL HIGH (ref 20.0–24.0)
O2 Saturation: 73.5 %
PATIENT TEMPERATURE: 98.6
PH VEN: 7.408 — AB (ref 7.250–7.300)
TCO2: 24.7 mmol/L (ref 0–100)
pCO2, Ven: 44 mmHg — ABNORMAL LOW (ref 45.0–50.0)
pO2, Ven: 41.7 mmHg (ref 30.0–45.0)

## 2014-10-26 LAB — URINALYSIS, ROUTINE W REFLEX MICROSCOPIC
Bilirubin Urine: NEGATIVE
Glucose, UA: >1000 mg/dL — AB
Ketones, ur: 15 mg/dL — AB
Leukocytes, UA: NEGATIVE
Nitrite: NEGATIVE
Protein, ur: 30 mg/dL — AB
Specific Gravity, Urine: 1.03 (ref 1.005–1.030)
Urobilinogen, UA: 0.2 mg/dL (ref 0.0–1.0)
pH: 5.5 (ref 5.0–8.0)

## 2014-10-26 LAB — TYPE AND SCREEN
ABO/RH(D): O POS
ANTIBODY SCREEN: NEGATIVE

## 2014-10-26 LAB — URINE MICROSCOPIC-ADD ON

## 2014-10-26 LAB — OCCULT BLOOD GASTRIC / DUODENUM (SPECIMEN CUP)
OCCULT BLOOD, GASTRIC: POSITIVE — AB
pH, Gastric: 1

## 2014-10-26 LAB — GLUCOSE, CAPILLARY: GLUCOSE-CAPILLARY: 249 mg/dL — AB (ref 65–99)

## 2014-10-26 LAB — LIPASE, BLOOD: Lipase: 12 U/L — ABNORMAL LOW (ref 22–51)

## 2014-10-26 LAB — CBG MONITORING, ED: Glucose-Capillary: 332 mg/dL — ABNORMAL HIGH (ref 65–99)

## 2014-10-26 MED ORDER — INSULIN GLARGINE 100 UNIT/ML ~~LOC~~ SOLN: 12.0000 [IU] | Freq: Every day | SUBCUTANEOUS | Status: DC

## 2014-10-26 MED ORDER — INSULIN GLARGINE 100 UNIT/ML ~~LOC~~ SOLN
12.0000 [IU] | Freq: Every day | SUBCUTANEOUS | Status: DC
  Administered 2014-10-26: 12 [IU] via SUBCUTANEOUS
  Filled 2014-10-26: qty 0.12

## 2014-10-26 MED ORDER — AMLODIPINE BESYLATE 10 MG PO TABS
10.0000 mg | ORAL_TABLET | Freq: Every day | ORAL | Status: DC
  Administered 2014-10-26 – 2014-10-28 (×2): 10 mg via ORAL
  Filled 2014-10-26 (×3): qty 1

## 2014-10-26 MED ORDER — PANTOPRAZOLE SODIUM 40 MG IV SOLR
40.0000 mg | Freq: Two times a day (BID) | INTRAVENOUS | Status: DC
  Administered 2014-10-26: 40 mg via INTRAVENOUS
  Filled 2014-10-26 (×3): qty 40

## 2014-10-26 MED ORDER — PROMETHAZINE HCL 25 MG PO TABS
12.5000 mg | ORAL_TABLET | Freq: Four times a day (QID) | ORAL | Status: DC | PRN
Start: 2014-10-26 — End: 2014-10-28
  Administered 2014-10-27: 12.5 mg via ORAL
  Filled 2014-10-26: qty 1

## 2014-10-26 MED ORDER — INSULIN ASPART 100 UNIT/ML ~~LOC~~ SOLN
0.0000 [IU] | Freq: Three times a day (TID) | SUBCUTANEOUS | Status: DC
  Administered 2014-10-27: 2 [IU] via SUBCUTANEOUS
  Administered 2014-10-27 (×2): 3 [IU] via SUBCUTANEOUS
  Administered 2014-10-28: 2 [IU] via SUBCUTANEOUS
  Administered 2014-10-28: 3 [IU] via SUBCUTANEOUS

## 2014-10-26 MED ORDER — HYDROMORPHONE HCL 1 MG/ML IJ SOLN
1.0000 mg | Freq: Once | INTRAMUSCULAR | Status: AC
  Administered 2014-10-26: 1 mg via INTRAVENOUS
  Filled 2014-10-26: qty 1

## 2014-10-26 MED ORDER — AMLODIPINE BESYLATE 10 MG PO TABS: 10.0000 mg | ORAL_TABLET | Freq: Every day | ORAL | Status: DC

## 2014-10-26 MED ORDER — ACETAMINOPHEN 325 MG PO TABS: 650.0000 mg | ORAL_TABLET | Freq: Four times a day (QID) | ORAL | Status: DC | PRN

## 2014-10-26 MED ORDER — RAMELTEON 8 MG PO TABS
8.0000 mg | ORAL_TABLET | Freq: Every day | ORAL | Status: DC
  Administered 2014-10-26 – 2014-10-27 (×2): 8 mg via ORAL
  Filled 2014-10-26 (×3): qty 1

## 2014-10-26 MED ORDER — PANTOPRAZOLE SODIUM 40 MG IV SOLR
INTRAVENOUS | Status: AC
  Filled 2014-10-26: qty 40

## 2014-10-26 MED ORDER — METOCLOPRAMIDE HCL 5 MG/ML IJ SOLN
10.0000 mg | Freq: Once | INTRAMUSCULAR | Status: DC
  Filled 2014-10-26: qty 2

## 2014-10-26 MED ORDER — HYDROMORPHONE HCL 1 MG/ML IJ SOLN: 1.0000 mg | INTRAMUSCULAR | Status: DC | PRN

## 2014-10-26 MED ORDER — SUCRALFATE 1 GM/10ML PO SUSP
1.0000 g | Freq: Three times a day (TID) | ORAL | Status: DC
  Administered 2014-10-26 – 2014-10-28 (×6): 1 g via ORAL
  Filled 2014-10-26 (×10): qty 10

## 2014-10-26 MED ORDER — METOCLOPRAMIDE HCL 10 MG PO TABS
10.0000 mg | ORAL_TABLET | Freq: Three times a day (TID) | ORAL | Status: DC
  Administered 2014-10-26 – 2014-10-28 (×6): 10 mg via ORAL
  Filled 2014-10-26 (×10): qty 1

## 2014-10-26 MED ORDER — PANTOPRAZOLE SODIUM 40 MG IV SOLR
40.0000 mg | INTRAVENOUS | Status: AC
  Administered 2014-10-26: 40 mg via INTRAVENOUS
  Filled 2014-10-26: qty 40

## 2014-10-26 MED ORDER — ONDANSETRON HCL 4 MG/2ML IJ SOLN: 4.0000 mg | Freq: Three times a day (TID) | INTRAMUSCULAR | Status: DC | PRN

## 2014-10-26 MED ORDER — PROMETHAZINE HCL 25 MG/ML IJ SOLN
25.0000 mg | Freq: Once | INTRAMUSCULAR | Status: AC
  Administered 2014-10-26: 25 mg via INTRAVENOUS
  Filled 2014-10-26: qty 1

## 2014-10-26 MED ORDER — ACETAMINOPHEN 650 MG RE SUPP: 650.0000 mg | Freq: Four times a day (QID) | RECTAL | Status: DC | PRN

## 2014-10-26 MED ORDER — SODIUM CHLORIDE 0.9 % IV SOLN
INTRAVENOUS | Status: DC
  Administered 2014-10-26: via INTRAVENOUS

## 2014-10-26 MED ORDER — INSULIN GLARGINE 100 UNIT/ML ~~LOC~~ SOLN
12.0000 [IU] | Freq: Two times a day (BID) | SUBCUTANEOUS | Status: DC
  Administered 2014-10-27: 12 [IU] via SUBCUTANEOUS
  Filled 2014-10-26 (×3): qty 0.12

## 2014-10-26 MED ORDER — HYDROMORPHONE HCL 1 MG/ML IJ SOLN
0.5000 mg | Freq: Once | INTRAMUSCULAR | Status: AC
  Administered 2014-10-26: 0.5 mg via INTRAVENOUS
  Filled 2014-10-26: qty 1

## 2014-10-26 MED ORDER — FLUOXETINE HCL 10 MG PO CAPS
10.0000 mg | ORAL_CAPSULE | Freq: Every day | ORAL | Status: DC
Start: 2014-10-27 — End: 2014-10-28
  Administered 2014-10-28: 10 mg via ORAL
  Filled 2014-10-26 (×2): qty 1

## 2014-10-26 MED ORDER — SODIUM CHLORIDE 0.9 % IV SOLN
INTRAVENOUS | Status: DC
Start: 2014-10-26 — End: 2014-10-26

## 2014-10-26 MED ORDER — SODIUM CHLORIDE 0.9 % IV BOLUS (SEPSIS)
1000.0000 mL | Freq: Once | INTRAVENOUS | Status: AC
  Administered 2014-10-26: 1000 mL via INTRAVENOUS

## 2014-10-26 NOTE — Telephone Encounter (Signed)
Pt states he is completely out of this medication, needs filled asap

## 2014-10-26 NOTE — ED Notes (Signed)
Nurse is in room starting IV

## 2014-10-26 NOTE — ED Notes (Signed)
Pt transported by [facility] for nausea, vomiting, and upper abd pain x 12 hours.  CBG also elevated to 324 today.

## 2014-10-26 NOTE — ED Notes (Addendum)
Pt c/o upper abd pain, nausea, and vomiting x 12 hours.  Pt states he feels like symptoms are coming from his reflux.  Patient also states his blood sugar has been elevated to 340 at home.  Pt states he has been unable to hold down his medications today.  Pt reports he has vomited several times today.

## 2014-10-26 NOTE — H&P (Signed)
Date: 10/26/2014               Patient Name:  [name] MRN: [id number]  DOB: [birth year] Age / Sex: 49 y.o., male   PCP: [name], MD         Medical Service: Internal Medicine Teaching Service         Attending Physician: Dr. [name], MD    First Contact: Larene Beach, MS4 Pager: [phone]  Second Contact: Dr. Denton Brick Pager: [phone]       After Hours (After 5p/  First Contact Pager: [phone]  weekends / holidays): Second Contact Pager: [phone]   History of Present Illness: [name] is a 49 yo male with uncontrolled DM (HbA1c 13.5 on 09/2014), gastroparesis, HTN, CKD2, GERD with distal esophagitis, and depression.  He presents with a one day h/o N/V and abdominal pain.  The pain started last night after drinking 2 beers.  Since then, he has had worsening nausea and vomiting, reporting coffee ground emesis yesterday.  Since then, he has vomited anything he has put in his stomach.  He denies hematemesis, melena, or hematochezia.  He also reports 9/10, stabbing abdominal pain, located in the epigastric region.  It radiates to the LUQ, but not the back.  When the pain comes, he breaks out in a sweat, vomits, and the pain subsides.  He endorses this is similar to his previous episodes of gastroparesis.  He takes Protonix BID at home.  In addition to alcohol, he reports that stress and spicy foods also worsen his abdominal pain and nausea/vomiting.  There is no change with fatty foods.  He is an everyday user of marijuana when it is available.  He smokes 1 ppd.  He endorses orthostasis when sitting up, periodic SOB, subjective weight loss (though he hasn't weighed himself), and chronic constipation. He denies fever, chills, cough, chest pain, diarrhea, or sick contacts.  He has a history of alcoholic pancreatitis.  His last drink was yesterday.  He has a h/o Mallory-Weiss tear.  His last EGD was 09/2014, noting distal erosive esophagitis and a small hiatal hernia.  His last BM  was today.  He reports constipation at baseline, only having a BM once/week.  He takes Castor oil to help have a BM.  In the ED, his emesis was positive for occult blood.     Meds: Current Facility-Administered Medications  Medication Dose Route Frequency Provider Last Rate Last Dose  . acetaminophen (TYLENOL) tablet 650 mg  650 mg Oral Q6H PRN Jones Bales, MD       Or  . acetaminophen (TYLENOL) suppository 650 mg  650 mg Rectal Q6H PRN Jones Bales, MD      . Derrill Memo ON 10/27/2014] amLODipine (NORVASC) tablet 10 mg  10 mg Oral Daily Jones Bales, MD      . Derrill Memo ON 10/27/2014] FLUoxetine (PROZAC) capsule 10 mg  10 mg Oral Daily Jones Bales, MD      . Derrill Memo ON 10/27/2014] insulin aspart (novoLOG) injection 0-9 Units  0-9 Units Subcutaneous TID WC Jones Bales, MD      . insulin glargine (LANTUS) injection 12 Units  12 Units Subcutaneous QHS Jones Bales, MD      . metoCLOPramide (REGLAN) tablet 10 mg  10 mg Oral TID AC & HS Jones Bales, MD      . pantoprazole (PROTONIX) injection 40 mg  40 mg Intravenous Q12H Jones Bales, MD      .  promethazine (PHENERGAN) injection 25 mg  25 mg Intravenous Once Iline Oven, MD      . promethazine (PHENERGAN) tablet 12.5 mg  12.5 mg Oral Q6H PRN Jones Bales, MD      . sucralfate (CARAFATE) 1 GM/10ML suspension 1 g  1 g Oral TID WC & HS Jones Bales, MD        Allergies: Allergies as of 10/26/2014  . (No Known Allergies)   Past Medical History  Diagnosis Date  . Onychomycosis   . Hypertension   . Diabetic peripheral neuropathy   . Diabetic foot ulcer     s/p Right first ray amputation, left transmetatarsal amputation with revision  . History of drug abuse     Cocaine and marijuana  . Exposure to trichomonas     treated empirically  . Cellulitis of left foot 03/2008    left 4th and 5th metatarsal area  . Hyperlipidemia   . Alcoholic pancreatitis 01/2724  . Ulcerative esophagitis 2007    severe,  complicated with UGI bleed  . Mallory [name] tear April 2009  . History of prolonged Q-T interval on ECG   . 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  . 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 CKD (chronic kidney disease) stage 2, GFR 60-89 ml/min     BL SCr 1.3-1.4  . Depression   . Peripheral vascular disease   . Type 2 diabetes mellitus, uncontrolled, with renal complications   . GERD (gastroesophageal reflux disease)   . 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 Cellulitis and abscess of leg 01/31/2014  . Osteomyelitis of left leg 01/21/2014  . History of left below knee amputation 01/21/2014    Performed on    . Status post below knee amputation of right lower extremity 03/23/2014   Past Surgical History  Procedure Laterality Date  . Toe amputation  06/2011    left; 4th toe  . Nephrectomy      L side  . I&d extremity  08/24/2011    Procedure: IRRIGATION AND DEBRIDEMENT EXTREMITY;  Surgeon: Meredith Pel, MD;  Location: [place];  Service: Orthopedics;  Laterality: Left;  . Amputation  08/24/2011    Procedure: AMPUTATION RAY;  Surgeon: Meredith Pel, MD;  Location: [place];  Service: Orthopedics;  Laterality: Left;  left fourth toe Ray Resection  . Amputation  12/05/2011    Procedure: AMPUTATION RAY;  Surgeon: Meredith Pel, MD;  Location: [place];  Service: Orthopedics;  Laterality: Left;  . I&d extremity  02/18/2012    Procedure: IRRIGATION AND DEBRIDEMENT EXTREMITY;  Surgeon: Mcarthur Rossetti, MD;  Location: [place];  Service: Orthopedics;  Laterality: Left;  . Amputation  02/18/2012    Procedure:  AMPUTATION DIGIT;  Surgeon: Mcarthur Rossetti, MD;  Location: [place];  Service: Orthopedics;  Laterality: Left;  revison transmetatarsal  . I&d extremity Left 08/28/2012    Procedure: IRRIGATION AND DEBRIDEMENT EXTREMITY;  Surgeon: Linna Hoff, MD;  Location: [place];  Service: Orthopedics;  Laterality: Left;  CYSTO TUBING/IRRIGATION, LEAD HAND.  Marland Kitchen  I&d extremity Left 08/30/2012    Procedure: IRRIGATION AND DEBRIDEMENT EXTREMITY;  Surgeon: Linna Hoff, MD;  Location: [place];  Service: Orthopedics;  Laterality: Left;  . Amputation Right 09/01/2012    Procedure: FOOT 1ST RAY AMPUTATION;  Surgeon: Newt Minion, MD;  Location: [place];  Service: Orthopedics;  Laterality: Right;  . Amputation Left 09/01/2012    Procedure: FOOT REVISION TRANSMETATARSAL AMPUTATION ;  Surgeon: Newt Minion, MD;  Location: [place];  Service: Orthopedics;  Laterality: Left;  . Amputation Right 09/13/2012    Procedure: AMPUTATION BELOW KNEE;  Surgeon: Newt Minion, MD;  Location: [place];  Service: Orthopedics;  Laterality: Right;  Right Below Knee Amputation  . Amputation Left 12/12/2013    Procedure: Left Below Knee Amputation;  Surgeon: Newt Minion, MD;  Location: [place];  Service: Orthopedics;  Laterality: Left;  . Stump revision Left 01/23/2014    Procedure: STUMP REVISION;  Surgeon: Newt Minion, MD;  Location: [place];  Service: Orthopedics;  Laterality: Left;  . Esophagogastroduodenoscopy (egd) with propofol N/A 09/16/2014    Procedure: ESOPHAGOGASTRODUODENOSCOPY (EGD) WITH PROPOFOL;  Surgeon: Wonda Horner, MD;  Location[facility];  Service: Endoscopy;  Laterality: N/A;   Family History  Problem Relation Age of Onset  . Diabetes type II Mother   . Pancreatic cancer Father    History   Social History  . Marital Status: Married    Spouse Name: N/A  . Number of Children: N/A  . Years of Education: 12   Occupational History  . disabled    Social History Main Topics  . Smoking status: Current Every Day  Smoker -- 2.00 packs/day for 34 years    Types: Cigarettes, Cigars  . Smokeless tobacco: Never Used     Comment: 1/2 PPD  . Alcohol Use: 0.0 oz/week    0 Standard drinks or equivalent per week     Comment: occ  . Drug Use: No     Comment: NO LONGER USES-GRADUATING FROM GEO CRE AFTER  3-4 MONTHS                                                                                                                                       . Sexual Activity: Not on file   Other Topics Concern  . Not on file   Social History Narrative    Review of Systems: Pertinent items are noted in HPI.  Physical Exam: Blood pressure 188/98, pulse 86, temperature 98 F (36.7 C), temperature source Oral, resp. rate 20, height 5\' 8"  (1.727 m), weight 174 lb 11.2 oz (79.243 kg), SpO2 100 %. Physical Exam  Constitutional: He is oriented to person, place, and time and well-developed, well-nourished, and in no distress.  HENT:  Head: Normocephalic and atraumatic.  MM dry.  Eyes: EOM are normal.  Neck: Normal range of motion. No tracheal deviation present.  Cardiovascular: Normal  rate, regular rhythm and normal heart sounds.   Pulmonary/Chest: Effort normal and breath sounds normal. No respiratory distress. He has no wheezes.  Abdominal: Soft. Bowel sounds are normal. He exhibits no distension. There is no tenderness. There is no rebound and no guarding.  Musculoskeletal:  B/l BKA  Neurological: He is alert and oriented to person, place, and time.  Skin: Skin is warm and dry.     Lab results: Basic Metabolic Panel:  Recent Labs  10/26/14 1600  NA 139  K 3.8  CL 99*  CO2 26  GLUCOSE 354*  BUN 19  CREATININE 1.36*  CALCIUM 9.4   Liver Function Tests:  Recent Labs  10/26/14 1600  AST 25  ALT 16*  ALKPHOS 117  BILITOT 0.8  PROT 8.7*  ALBUMIN 4.4    Recent Labs  10/26/14 1600  LIPASE 12*   No results for input(s): AMMONIA in the last 72 hours. CBC:  Recent Labs  10/26/14 1600    WBC 13.3*  NEUTROABS 11.9*  HGB 11.9*  HCT 37.3*  MCV 89.2  PLT 567*   Cardiac Enzymes: No results for input(s): CKTOTAL, CKMB, CKMBINDEX, TROPONINI in the last 72 hours. BNP: No results for input(s): PROBNP in the last 72 hours. D-Dimer: No results for input(s): DDIMER in the last 72 hours. CBG:  Recent Labs  10/26/14 1638 10/26/14 2130  GLUCAP 332* 249*   Hemoglobin A1C: No results for input(s): HGBA1C in the last 72 hours. Fasting Lipid Panel: No results for input(s): CHOL, HDL, LDLCALC, TRIG, CHOLHDL, LDLDIRECT in the last 72 hours. Thyroid Function Tests: No results for input(s): TSH, T4TOTAL, FREET4, T3FREE, THYROIDAB in the last 72 hours. Anemia Panel: No results for input(s): VITAMINB12, FOLATE, FERRITIN, TIBC, IRON, RETICCTPCT in the last 72 hours. Coagulation: No results for input(s): LABPROT, INR in the last 72 hours. Urine Drug Screen: Drugs of Abuse     Component Value Date/Time   LABOPIA NONE DETECTED 01/22/2014 1035   COCAINSCRNUR NONE DETECTED 01/22/2014 1035   COCAINSCRNUR NEG 09/22/2008 2125   LABBENZ NONE DETECTED 01/22/2014 1035   LABBENZ NEG 09/22/2008 2125   AMPHETMU NONE DETECTED 01/22/2014 1035   AMPHETMU NEG 09/22/2008 2125   THCU POSITIVE* 01/22/2014 1035   LABBARB NONE DETECTED 01/22/2014 1035    Alcohol Level: No results for input(s): ETH in the last 72 hours. Urinalysis:  Recent Labs  10/26/14 1417  COLORURINE YELLOW  LABSPEC 1.030  PHURINE 5.5  GLUCOSEU >1000*  HGBUR MODERATE*  BILIRUBINUR NEGATIVE  KETONESUR 15*  PROTEINUR 30*  UROBILINOGEN 0.2  NITRITE NEGATIVE  LEUKOCYTESUR NEGATIVE   Misc. Labs:   Imaging results:  [facility] 10/26/14 INDINGS: The heart size and mediastinal contours are within normal limits. Both lungs are clear. The visualized skeletal structures are unremarkable.  IMPRESSION: Normal chest x-ray.  Other results: EKG: left axis deviation, borderline prolonged QTc  Assessment & Plan by  Problem: Principal Problem:   Hematemesis with nausea Active Problems:   Diabetes mellitus type 2, uncontrolled, with complications   Essential hypertension   GERD (gastroesophageal reflux disease)   Anemia, iron deficiency   S/P bilateral BKA (below knee amputation)   Esophagitis (distal), erosive (09/2014)   Hematemesis  Mr. Castoro is a 49 yo male with uncontrolled DM (HbA1c 13.5 on 09/2014), gastroparesis, HTN, CKD2, GERD with distal esophagitis, and depression, presenting with N/V and abdominal pain.  Gastroparesis with Coffee Ground Emesis: Given patient's uncontrolled diabetes and h/o similar symptoms, gastroparesis is the likely culprit of the N/V.  This is compounded by his history of erosive esophagitis, GERD, and chronic marijuana use.  Last EGD in June 2016 showed no evidence of PUD. Considering his chronic alcohol use and h/o alcoholic pancreatitis, KUB and possibly CT abdomen should be considered to evaluate for changes associated with chronic pancreatitis.  His last BM was yesterday and he has no fever or diarrheal complaints, making SBO and viral gastroenteritis less likely.  Will check Troponin to r/o ACS in the setting of uncontrolled DM.   - Troponin - IVF @ 75 ml/hr - Metoclopramide 10 mg PO TID - Protonix 40 mg IV BID - Phenergan - Sucralfate  DMII: Uncontrolled, with A1c 13.5% in June 2016.  Home insulin regimen is Lantus 25 units qAM and 20 units qPM.  He also recently started Metformin, which we will hold while admitted. - Lantus 12 unit BID - SSI  HTN: Home regimen currently Amlodipine 10 mg.  Lisinopril 20 mg daily held at last clinic visit until Cr normalizes.  Can likely restart this admission for BP control - Amlodipine 10 mg  Fe Def Anemia: Stable. Hold Fe in setting of GI distress. CKD2: Stable. Patient's Creatinine at recent low.  GERD: Protonix 40 mg IV BID Depression: Prozac 10 mg   FEN/GI: - NS @ 75 ml/hr - Protonix IV BID - NPO  DVT Ppx: hold in  setting of UGI bleed  Dispo: Disposition is deferred at this time, awaiting improvement of current medical problems.   The patient does have a current PCP [name], MD) and does need an [facility] hospital follow-up appointment after discharge.  The patient does not have transportation limitations that hinder transportation to clinic appointments.  Signed: Iline Oven, MD, PhD 10/26/2014, 10:28 PM

## 2014-10-26 NOTE — ED Notes (Signed)
[facility] called for transfer to [facility]. [facility] reports they will get a truck to transfer within the hour.

## 2014-10-26 NOTE — ED Provider Notes (Addendum)
CSN: [id number]     Arrival date & time 10/26/14  1300 History   First MD Initiated Contact with Patient 10/26/14 1357     Chief Complaint  Patient presents with  . Abdominal Pain    N/V, upper abd pain x 12 hours.      (Consider location/radiation/quality/duration/timing/severity/associated sxs/prior Treatment) HPI Comments: 49 year old male with extensive past medical history including poorly controlled IDDM, gastroparesis, GERD, upper GI bleed, left BKA who presents with abdominal pain, nausea, and vomiting. The patient states that 12 hours ago, he began having upper abdominal pain associated with nausea and vomiting. The pain feels like previous episodes of gastroparesis. The pain is severe and constant. He is also noted that his blood glucose was elevated at 340 at home today. He is then unable to tolerate his blood pressure medications today because of his vomiting. He has taken his insulin as prescribed. No fevers, cough/cold symptoms, or shortness of breath. He does endorse some chest pain which he has had with previous episodes of gastroparesis. He states that he took the liquid medication that was prescribed to him until it was finished but he has continued to have the symptoms despite protonics.   Patient is a 49 y.o. male presenting with abdominal pain. The history is provided by the patient.  Abdominal Pain   Past Medical History  Diagnosis Date  . Onychomycosis   . Hypertension   . Diabetic peripheral neuropathy   . Diabetic foot ulcer     s/p Right first ray amputation, left transmetatarsal amputation with revision  . History of drug abuse     Cocaine and marijuana  . Exposure to trichomonas     treated empirically  . Cellulitis of left foot 03/2008    left 4th and 5th metatarsal area  . Hyperlipidemia   . 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 Ulcerative esophagitis 5956    severe, complicated with UGI bleed  . Mallory [name] tear April 2009  . History of prolonged Q-T  interval on ECG   . 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  . 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 CKD (chronic kidney disease) stage 2, GFR 60-89 ml/min     BL SCr 1.3-1.4  . Depression   . Peripheral vascular disease   . Type 2 diabetes mellitus, uncontrolled, with renal complications   . GERD (gastroesophageal reflux disease)   . 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 Cellulitis and abscess of leg 01/31/2014  . Osteomyelitis of left leg 01/21/2014  . History of left below knee amputation 01/21/2014    Performed on    . Status post below knee amputation of right lower extremity 03/23/2014   Past Surgical History  Procedure Laterality Date  . Toe amputation  06/2011    left; 4th toe  . Nephrectomy      L side  . I&d extremity  08/24/2011    Procedure: IRRIGATION AND DEBRIDEMENT EXTREMITY;  Surgeon: Meredith Pel, MD;  Location: [place];  Service: Orthopedics;  Laterality: Left;  . Amputation  08/24/2011    Procedure:  AMPUTATION RAY;  Surgeon: Meredith Pel, MD;  Location: [place];  Service: Orthopedics;  Laterality: Left;  left fourth toe Ray Resection  . Amputation  12/05/2011    Procedure: AMPUTATION RAY;  Surgeon: Meredith Pel, MD;  Location: [place];  Service: Orthopedics;  Laterality: Left;  . I&d extremity  02/18/2012    Procedure: IRRIGATION AND DEBRIDEMENT EXTREMITY;  Surgeon: Mcarthur Rossetti, MD;  Location: [place];  Service: Orthopedics;  Laterality: Left;  . Amputation  02/18/2012    Procedure: AMPUTATION DIGIT;  Surgeon: Mcarthur Rossetti, MD;  Location: [place];  Service: Orthopedics;   Laterality: Left;  revison transmetatarsal  . I&d extremity Left 08/28/2012    Procedure: IRRIGATION AND DEBRIDEMENT EXTREMITY;  Surgeon: Linna Hoff, MD;  Location: [place];  Service: Orthopedics;  Laterality: Left;  CYSTO TUBING/IRRIGATION, LEAD HAND.  . I&d extremity Left 08/30/2012    Procedure: IRRIGATION AND DEBRIDEMENT EXTREMITY;  Surgeon: Linna Hoff, MD;  Location: [place];  Service: Orthopedics;  Laterality: Left;  . Amputation Right 09/01/2012    Procedure: FOOT 1ST RAY AMPUTATION;  Surgeon: Newt Minion, MD;  Location: [place];  Service: Orthopedics;  Laterality: Right;  . Amputation Left 09/01/2012    Procedure: FOOT REVISION TRANSMETATARSAL AMPUTATION ;  Surgeon: Newt Minion, MD;  Location: [place];  Service: Orthopedics;  Laterality: Left;  . Amputation Right 09/13/2012    Procedure: AMPUTATION BELOW KNEE;  Surgeon: Newt Minion, MD;  Location: [place];  Service: Orthopedics;  Laterality: Right;  Right Below Knee Amputation  . Amputation Left 12/12/2013    Procedure: Left Below Knee Amputation;  Surgeon: Newt Minion, MD;  Location: [place];  Service: Orthopedics;  Laterality: Left;  . Stump revision Left 01/23/2014    Procedure: STUMP REVISION;  Surgeon: Newt Minion, MD;  Location: [place];  Service: Orthopedics;  Laterality: Left;  . Esophagogastroduodenoscopy (egd) with propofol N/A 09/16/2014    Procedure: ESOPHAGOGASTRODUODENOSCOPY (EGD) WITH PROPOFOL;  Surgeon: Wonda Horner, MD;  Location: [facility] ENDOSCOPY;  Service: Endoscopy;  Laterality: N/A;   Family History  Problem Relation Age of Onset  . Diabetes type II Mother   . Pancreatic cancer Father    History  Substance Use Topics  . Smoking status: Current Every Day Smoker -- 2.00 packs/day for 34 years    Types: Cigarettes, Cigars  . Smokeless tobacco: Never Used     Comment: 1/2 PPD  . Alcohol Use: 0.0 oz/week    0 Standard drinks or equivalent per week     Comment: occ    Review of Systems  Gastrointestinal: Positive for  abdominal pain.  All other systems reviewed and are negative.     Allergies  Review of patient's allergies indicates no known allergies.  Home Medications   Prior to Admission medications   Medication Sig Start Date End Date Taking? Authorizing Provider  ACCU-CHEK FASTCLIX LANCETS MISC 1 each by Does not apply route 4 (four) times daily -  before meals and at bedtime. 03/23/14  Yes Alexa Sherral Hammers, MD  acetaminophen-codeine (TYLENOL #3) 300-30 MG per tablet Take 1-2 tablets by mouth every 4 (four) hours as needed for moderate pain. 10/06/14  Yes Nischal Narendra, MD  amLODipine (NORVASC) 10 MG tablet Take 0.5 tablets (5 mg total) by mouth daily. Patient taking differently: Take 10 mg by mouth daily. Patient was advised to increase dose to 10mg  09/23/14  Yes [name], MD  EASY COMFORT INSULIN SYRINGE 31G  X 5/16" 1 ML MISC Use as directed daily for insulin injection. 10/05/14  Yes Historical Provider, MD  FLUoxetine (PROZAC) 10 MG capsule Take 1 capsule (10 mg total) by mouth daily. 09/23/14 09/23/15 Yes [name], MD  gabapentin (NEURONTIN) 600 MG tablet Take 1 tablet (600 mg total) by mouth 3 (three) times daily. 06/04/14  Yes [name], MD  glucose blood (ACCU-CHEK SMARTVIEW) test strip 1 each by Other route 4 (four) times daily -  before meals and at bedtime. Use as instructed 07/09/14  Yes [name], MD  insulin aspart (NOVOLOG) 100 UNIT/ML injection Inject 5 Units into the skin 3 (three) times daily with meals. 09/16/14  Yes Tasrif Ahmed, MD  insulin glargine (LANTUS) 100 UNIT/ML injection Inject 0.45 mLs (45 Units total) into the skin daily. Inject 25 units in the morning, and 20 units in the evening, 12 hours apart 10/09/14 10/09/15 Yes [name], MD  metFORMIN (GLUCOPHAGE) 500 MG tablet Take 1 tablet (500 mg total) by mouth daily with breakfast. 10/09/14 10/09/15 Yes [name], MD  pantoprazole (PROTONIX) 40 MG tablet Take 1 tablet (40 mg total) by mouth 2 (two)  times daily. 09/16/14  Yes Tasrif Ahmed, MD  polyethylene glycol (MIRALAX / GLYCOLAX) packet Take 17 g by mouth daily. 05/26/14  Yes [name], MD  sildenafil (REVATIO) 20 MG tablet Take 2-5 tablets before sexual activity. 08/25/14  Yes [name], MD  feeding supplement, GLUCERNA SHAKE, (GLUCERNA SHAKE) LIQD Take 237 mLs by mouth 2 (two) times daily between meals. Patient not taking: Reported on 10/09/2014 09/16/14   Dellia Nims, MD  ferrous sulfate 325 (65 FE) MG tablet Take 1 tablet (325 mg total) by mouth 3 (three) times daily with meals. Patient not taking: Reported on 10/26/2014 02/09/14   Corky Sox, MD  folic acid (FOLVITE) 1 MG tablet Take 1 tablet (1 mg total) by mouth daily. Patient not taking: Reported on 10/09/2014 09/16/14   Dellia Nims, MD  nicotine (NICODERM CQ - DOSED IN MG/24 HOURS) 14 mg/24hr patch Place 1 patch onto the skin daily. Patient not taking: Reported on 10/09/2014 03/14/14   [name], MD  sucralfate (CARAFATE) 1 GM/10ML suspension Take 10 mLs (1 g total) by mouth 4 (four) times daily -  with meals and at bedtime. Patient not taking: Reported on 10/09/2014 09/16/14   Tasrif Ahmed, MD   BP 193/95 mmHg  Pulse 79  Temp(Src) 97.8 F (36.6 C) (Oral)  Resp 16  SpO2 100% Physical Exam  Constitutional: He is oriented to person, place, and time.  Awake and alert, holding abdomen, sitting forward with emesis basin, in mild distress  HENT:  Dark brown emesis on tongue  Eyes: Pupils are equal, round, and reactive to light.  Cardiovascular: Normal rate, regular rhythm and normal heart sounds.   Pulmonary/Chest: Effort normal and breath sounds normal.  Abdominal: Soft. Bowel sounds are normal. He exhibits no distension. There is no tenderness.  Neurological: He is alert and oriented to person, place, and time.  Skin: Skin is warm and dry.  Psychiatric: His behavior is normal. Judgment and thought content normal.  Nursing note and vitals reviewed.   ED Course   Procedures (including critical care time) Labs Review Labs Reviewed  CBC WITH DIFFERENTIAL/PLATELET  COMPREHENSIVE METABOLIC PANEL  LIPASE, BLOOD  URINALYSIS, ROUTINE W REFLEX MICROSCOPIC (NOT AT [facility]  OCCULT BLOOD GASTRIC / DUODENUM (SPECIMEN CUP)  TYPE AND SCREEN    Imaging Review No results found.   EKG Interpretation  Date/Time:  Monday October 26 2014 14:22:55 EDT Ventricular Rate:  83 PR Interval:  119 QRS Duration: 108 QT Interval:  419 QTC Calculation: 492 R Axis:   -66 Text Interpretation:  Sinus rhythm Borderline short PR interval Incomplete  RBBB and LAFB RSR' in V1 or V2, right VCD or RVH Borderline prolonged QT  interval Confirmed by DOCHERTY  MD, MEGAN [phone]) on 10/26/2014 2:28:32 PM      MDM   Final diagnoses:  None  Hematemesis epigastric abdominal pain Esophagitis Hyperglycemia Hypertension Prolonged QT   49 year old male with past medical history above who presents with 12 hours of vomiting associated with upper abdominal pain. Patient uncomfortable with hypertension at presentation, BP 292K systolic. The rest of his vital signs were unremarkable. No abdominal tenderness on exam but emesis concerning for blood. Gastroccult positive. Obtained labs including CBC, CMP, VBG, type and screen, UA. UA with small amount of ketones and protein. Gave the patient an IV fluid bolus as well as IV Protonix. Also gave IV dilaudid for pain. EKG showed borderline prolonged QT thus I avoided antiemetics.  VBG shows pH 7.40.   On chart review, the patient was hospitalized recently for upper GI bleed and was noted to have esophagitis on EGD. He has completed his Carafate course and continues to take Protonix without improvement. He will be admitted to the hospital team for management of his gastroparesis and esophagitis.  Sharlett Iles, MD 10/26/14 1645  Spoke with hospitalist who noted that patient's PCP is with Internal Medicine at [facility]; previous  admissions have been to [facility]. Spoke with resident on call for internal medicine resident service. The patient will be transferred to [facility] for admission. Patient's CBC and BMP show stable H/H and stable CKD.  Sharlett Iles, MD 10/26/14 [phone]

## 2014-10-26 NOTE — ED Notes (Signed)
Unable to draw blood from IV line.

## 2014-10-27 ENCOUNTER — Encounter (HOSPITAL_COMMUNITY): Payer: Self-pay | Admitting: General Practice

## 2014-10-27 DIAGNOSIS — E1165 Type 2 diabetes mellitus with hyperglycemia: Secondary | ICD-10-CM | POA: Diagnosis not present

## 2014-10-27 DIAGNOSIS — K3184 Gastroparesis: Secondary | ICD-10-CM | POA: Diagnosis not present

## 2014-10-27 DIAGNOSIS — K920 Hematemesis: Secondary | ICD-10-CM

## 2014-10-27 DIAGNOSIS — K219 Gastro-esophageal reflux disease without esophagitis: Secondary | ICD-10-CM | POA: Diagnosis not present

## 2014-10-27 LAB — BASIC METABOLIC PANEL
Anion gap: 14 (ref 5–15)
BUN: 15 mg/dL (ref 6–20)
CO2: 23 mmol/L (ref 22–32)
Calcium: 9.1 mg/dL (ref 8.9–10.3)
Chloride: 103 mmol/L (ref 101–111)
Creatinine, Ser: 1.56 mg/dL — ABNORMAL HIGH (ref 0.61–1.24)
GFR calc Af Amer: 59 mL/min — ABNORMAL LOW (ref >60)
GFR calc non Af Amer: 51 mL/min — ABNORMAL LOW (ref >60)
Glucose, Bld: 251 mg/dL — ABNORMAL HIGH (ref 65–99)
Potassium: 3.6 mmol/L (ref 3.5–5.1)
Sodium: 140 mmol/L (ref 135–145)

## 2014-10-27 LAB — RAPID URINE DRUG SCREEN, HOSP PERFORMED
Amphetamines: NOT DETECTED
Barbiturates: NOT DETECTED
Benzodiazepines: NOT DETECTED
COCAINE: NOT DETECTED
OPIATES: POSITIVE — AB
TETRAHYDROCANNABINOL: POSITIVE — AB

## 2014-10-27 LAB — CBC
HEMATOCRIT: 35.2 % — AB (ref 39.0–52.0)
HEMOGLOBIN: 11.3 g/dL — AB (ref 13.0–17.0)
MCH: 28.5 pg (ref 26.0–34.0)
MCHC: 32.1 g/dL (ref 30.0–36.0)
MCV: 88.9 fL (ref 78.0–100.0)
Platelets: 564 10*3/uL — ABNORMAL HIGH (ref 150–400)
RBC: 3.96 MIL/uL — AB (ref 4.22–5.81)
RDW: 13.7 % (ref 11.5–15.5)
WBC: 18.2 10*3/uL — ABNORMAL HIGH (ref 4.0–10.5)

## 2014-10-27 LAB — TROPONIN I: Troponin I: <0.03 ng/mL (ref <0.031)

## 2014-10-27 LAB — GLUCOSE, CAPILLARY
Glucose-Capillary: 213 mg/dL — ABNORMAL HIGH (ref 65–99)
Glucose-Capillary: 204 mg/dL — ABNORMAL HIGH (ref 65–99)
Glucose-Capillary: 191 mg/dL — ABNORMAL HIGH (ref 65–99)
Glucose-Capillary: 189 mg/dL — ABNORMAL HIGH (ref 65–99)

## 2014-10-27 LAB — MAGNESIUM: Magnesium: 2 mg/dL (ref 1.7–2.4)

## 2014-10-27 MED ORDER — INSULIN GLARGINE 100 UNIT/ML ~~LOC~~ SOLN
15.0000 [IU] | Freq: Two times a day (BID) | SUBCUTANEOUS | Status: DC
  Administered 2014-10-27 – 2014-10-28 (×2): 15 [IU] via SUBCUTANEOUS
  Filled 2014-10-27 (×3): qty 0.15

## 2014-10-27 MED ORDER — SUCRALFATE 1 GM/10ML PO SUSP: 1.0000 g | Freq: Three times a day (TID) | ORAL | Status: AC

## 2014-10-27 MED ORDER — LISINOPRIL 20 MG PO TABS
20.0000 mg | ORAL_TABLET | Freq: Every day | ORAL | Status: DC
  Administered 2014-10-27 – 2014-10-28 (×2): 20 mg via ORAL
  Filled 2014-10-27 (×3): qty 1

## 2014-10-27 MED ORDER — PANTOPRAZOLE SODIUM 40 MG PO TBEC
40.0000 mg | DELAYED_RELEASE_TABLET | Freq: Two times a day (BID) | ORAL | Status: DC
  Administered 2014-10-27 – 2014-10-28 (×3): 40 mg via ORAL
  Filled 2014-10-27 (×3): qty 1

## 2014-10-27 NOTE — Discharge Instructions (Signed)
1) You have a hospital follow-up appointment with Dr. Naaman Plummer at the [facility] on 11/03/14 at 2:45pm.   2) You have been started on the following medications: - Reglan 10mg  by mouth three times a before meals and bedtime - for nausea/vomiting - lisinopril 20mg  by mouth daily - for high blood pressure  3) Please resume taking all your other medications as prescribed prior to your hospitalization.  Gastroparesis  Gastroparesis is also called slowed stomach emptying (delayed gastric emptying). It is a condition in which the stomach takes too long to empty its contents. It often happens in people with diabetes.  CAUSES  Gastroparesis happens when nerves to the stomach are damaged or stop working. When the nerves are damaged, the muscles of the stomach and intestines do not work normally. The movement of food is slowed or stopped. High blood glucose (sugar) causes changes in nerves and can damage the blood vessels that carry oxygen and nutrients to the nerves. RISK FACTORS  Diabetes.  Post-viral syndromes.  Eating disorders (anorexia, bulimia).  Surgery on the stomach or vagus nerve.  Gastroesophageal reflux disease (rarely).  Smooth muscle disorders (amyloidosis, scleroderma).  Metabolic disorders, including hypothyroidism.  Parkinson disease. SYMPTOMS   Heartburn.  Feeling sick to your stomach (nausea).  Vomiting of undigested food.  An early feeling of fullness when eating.  Weight loss.  Abdominal bloating.  Erratic blood glucose levels.  Lack of appetite.  Gastroesophageal reflux.  Spasms of the stomach wall. Complications can include:  Bacterial overgrowth in stomach. Food stays in the stomach and can ferment and cause bacteria to grow.  Weight loss due to difficulty digesting and absorbing nutrients.  Vomiting.  Obstruction in the stomach. Undigested food can harden and cause nausea and vomiting.  Blood glucose fluctuations caused  by inconsistent food absorption. DIAGNOSIS  The diagnosis of gastroparesis is confirmed through one or more of the following tests:  Barium X-rays and scans. These tests look at how long it takes for food to move through the stomach.  Gastric manometry. This test measures electrical and muscular activity in the stomach. A thin tube is passed down the throat into the stomach. The tube contains a wire that takes measurements of the stomach's electrical and muscular activity as it digests liquids and solid food.  Endoscopy. This procedure is done with a long, thin tube called an endoscope. It is passed through the mouth and gently down the esophagus into the stomach. This tube helps the caregiver look at the lining of the stomach to check for any abnormalities.  Ultrasonography. This can rule out gallbladder disease or pancreatitis. This test will outline and define the shape of the gallbladder and pancreas. TREATMENT   Treatments may include:  Exercise.  Medicines to control nausea and vomiting.  Medicines to stimulate stomach muscles.  Changes in what and when you eat.  Having smaller meals more often.  Eating low-fiber forms of high-fiber foods, such as eating cooked vegetables instead of raw vegetables.  Eating low-fat foods.  Consuming liquids, which are easier to digest.  In severe cases, feeding tubes and intravenous (IV) feeding may be needed. It is important to note that in most cases, treatment does not cure gastroparesis. It is usually a lasting (chronic) condition. Treatment helps you manage the underlying condition so that you can be as healthy and comfortable as possible. Other treatments  A gastric neurostimulator has been developed to assist people with gastroparesis. The battery-operated device is surgically implanted. It emits  mild electrical pulses to help improve stomach emptying and to control nausea and vomiting.  The use of botulinum toxin has been shown to  improve stomach emptying by decreasing the prolonged contractions of the muscle between the stomach and the small intestine (pyloric sphincter). The benefits are temporary. SEEK MEDICAL CARE IF:   You have diabetes and you are having problems keeping your blood glucose in goal range.  You are having nausea, vomiting, bloating, or early feelings of fullness with eating.  Your symptoms do not change with a change in diet. Document Released: 03/27/2005 Document Revised: 07/22/2012 Document Reviewed: 09/03/2008 [facility] Patient Information 2015 [place], [place]. This information is not intended to replace advice given to you by your health care provider. Make sure you discuss any questions you have with your health care provider.

## 2014-10-27 NOTE — Progress Notes (Signed)
Subjective: Patient complaining of thirst this morning, currently NPO pending possible GES.  He denies any episodes of nausea or vomiting overnight. He denies fever, chills, diarrhea.   Chart review of hospital discharge summary from 07/2007:  Patient had positive gastric emptying study in 02/2007 which showed abnormally low gastric emptying (gastric retention at 1 hour was 89% and 56% at 2 hours). Per discharge summary, his gastroparesis and cessation of Reglan could have been cause of nausea and vomiting prior to admission. The patient was discharged on Reglan with resolution of nausea and vomiting.   Objective: Vital signs in last 24 hours: Filed Vitals:   10/26/14 2031 10/27/14 0107 10/27/14 0432 10/27/14 0839  BP: 188/98 171/93 176/86 187/86  Pulse: 86 107 101 110  Temp: 98 F (36.7 C) 98.2 F (36.8 C) 97.6 F (36.4 C) 98.2 F (36.8 C)  TempSrc: Oral Oral  Axillary  Resp: 20 20 21 18   Height: 5\' 8"  (1.727 m)     Weight: 79.243 kg (174 lb 11.2 oz)     SpO2: 100% 100% 99% 100%   Weight change:   Intake/Output Summary (Last 24 hours) at 10/27/14 1152 Last data filed at 10/27/14 1147  Gross per 24 hour  Intake 936.25 ml  Output   1260 ml  Net -323.75 ml   General Appearance:  Patient appears tired lying in bed complaining of thirst. Otherwise, NAD. Head:  [place]/AT Mouth:  Dry MM Lungs:  Clear to auscultation bilaterally, respirations unlabored Heart:  Regular rate and rhythm, S1 and S2 normal, no murmur, rub or gallop Abdomen:  Mild TTP over epigastrium. Soft, +BS, no masses, no organomegaly Extremities: No lower extremity edema. Pulses:  2+ radial and DP pulses MSK: bilateral BKA  Labs: Basic Metabolic Panel:  Recent Labs Lab 10/26/14 1600 10/26/14 2340 10/27/14 0505  NA 139  --  140  K 3.8  --  3.6  CL 99*  --  103  CO2 26  --  23  GLUCOSE 354*  --  251*  BUN 19  --  15  CREATININE 1.36*  --  1.56*  CALCIUM 9.4  --  9.1  MG  --  2.0  --    Liver  Function Tests:  Recent Labs Lab 10/26/14 1600  AST 25  ALT 16*  ALKPHOS 117  BILITOT 0.8  PROT 8.7*  ALBUMIN 4.4    Recent Labs Lab 10/26/14 1600  LIPASE 12*   CBC:  Recent Labs Lab 10/26/14 1600 10/27/14 0505  WBC 13.3* 18.2*  NEUTROABS 11.9*  --   HGB 11.9* 11.3*  HCT 37.3* 35.2*  MCV 89.2 88.9  PLT 567* 564*   Cardiac Enzymes:  Recent Labs Lab 10/26/14 2340  TROPONINI <0.03   CBG:  Recent Labs Lab 10/26/14 1638 10/26/14 2130 10/27/14 0835  GLUCAP 332* 249* 213*   Urine Drug Screen: Drugs of Abuse     Component Value Date/Time   LABOPIA POSITIVE* 10/27/2014 0452   COCAINSCRNUR NONE DETECTED 10/27/2014 0452   COCAINSCRNUR NEG 09/22/2008 2125   LABBENZ NONE DETECTED 10/27/2014 0452   LABBENZ NEG 09/22/2008 2125   AMPHETMU NONE DETECTED 10/27/2014 0452   AMPHETMU NEG 09/22/2008 2125   THCU POSITIVE* 10/27/2014 0452   LABBARB NONE DETECTED 10/27/2014 0452    Urinalysis:  Recent Labs Lab 10/26/14 1417  COLORURINE YELLOW  LABSPEC 1.030  PHURINE 5.5  GLUCOSEU >1000*  HGBUR MODERATE*  BILIRUBINUR NEGATIVE  KETONESUR 15*  PROTEINUR 30*  UROBILINOGEN 0.2  NITRITE NEGATIVE  LEUKOCYTESUR NEGATIVE    Other results: EKG (10/26/2014):  - sinus rhythm (83 bpm) - PR 119 (borderline short), QRS 108, QT/QTc 419/492 (borderline prolonged) - RSR' in V1, incomplete RBBB - ST/T wave abnormalities  Imaging: No results found.  Medications:  Infusions: . sodium chloride 75 mL/hr at 10/26/14 2355    Scheduled Medications: . amLODipine  10 mg Oral Daily  . FLUoxetine  10 mg Oral Daily  . insulin aspart  0-9 Units Subcutaneous TID WC  . insulin glargine  12 Units Subcutaneous Q12H  . metoCLOPramide  10 mg Oral TID AC & HS  . pantoprazole (PROTONIX) IV  40 mg Intravenous Q12H  . ramelteon  8 mg Oral QHS  . sucralfate  1 g Oral TID WC & HS    PRN Medications: acetaminophen **OR** acetaminophen, promethazine     Assessment/Plan: Principal Problem:   Hematemesis with nausea Active Problems:   Diabetes mellitus type 2, uncontrolled, with complications   Essential hypertension   GERD (gastroesophageal reflux disease)   Anemia, iron deficiency   S/P bilateral BKA (below knee amputation)   Esophagitis (distal), erosive (09/2014)   Hematemesis  [name] is a 49 yo male with uncontrolled DMII (HbA1c 13.5 on 09/2014), gastroparesis, HTN, CKD2, GERD with distal esophagitis, and depression, presenting with N/V and abdominal pain.  #Gastroparesis with Coffee Ground Emesis: GES (02/2007) showed abnormally low gastric emptying (gastric retention at 1 hour was 89% and 56% at 2 hours) c/w gastroparesis explained by patient's chronic uncontrolled DM.  Emesis and nausea likely 2/2 to gastroparesis. Hematemesis could be explained by Mallory-Weiss tear compounded by his history of erosive esophagitis and GERD.  Notably, last EGD (09/2014) showed no evidence of PUD.  Hemoglobin 11.3, at baseline.  - Cont metoclopramide 10 mg PO TID; cont at discharge - Transition Protonix 40 mg IV BID to PO - Phenergan - Sucralfate - Advance diet as tolerated with discharge pending adequate PO intake  #DMII: Uncontrolled, with A1c 13.5% in June 2016. Home insulin regimen is Lantus 25 units qAM and 20 units qPM. He also recently started Metformin, which we will hold while admitted. - Lantus 12 unit BID - SSI  #HTN: Home regimen currently Amlodipine 10 mg. Lisinopril 20 mg daily held at last clinic visit until creatinine normalizes. Baseline creatinine appears to around 1.4 - 1.6.  This morning, creatinine 1.56.  - Amlodipine 10 mg - Restart Lisinopril 20mg  daily. Consider addition of HCTZ or beta-blocker if HTN not responsive as an outpatient.   #Fe Def Anemia: Stable. Hold Fe in setting of GI distress. Restart at discharge.  #CKD2: Stable. He has a history of left nephrectomy due to chronic nephritis.  UA significant for  moderate blood with 7-10 RBCs/HPF. Defer to PCP for further work-up.  #GERD: Transition to PO Protonix 40 mg BID  #Depression: Continue Prozac 10 mg   #FEN/GI/PPx -Diet = Advance as tolerated -Fluids = d/c IVF -GI = Ranitidine for reflux -DVT ppx = hold in setting of UGI bleed; SCDs  #Code Status -Full code   #Disposition -Anticipated discharge in approximately 0-1 day(s).  -The patient does have a current PCP [name], MD) and does need an [facility] hospital follow-up appointment after discharge.  Scheduled for 11/03/14.   SERVICE NEEDED AT [place]         Y = Yes, Blank = No PT:   OT:   RN:   Equipment:   Other:    Length of  Stay:  day(s)  This is a Careers information officer Note.  The care of the patient was discussed with Dr. Denton Brick and the assessment and plan formulated with their assistance.  Please see their attached note for official documentation of the daily encounter.     [name], Med Student 10/27/2014, 11:52 AM

## 2014-10-27 NOTE — Progress Notes (Signed)
Inpatient Diabetes Program Recommendations  AACE/ADA: New Consensus Statement on Inpatient Glycemic Control (2013)  Target Ranges:  Prepandial:   less than 140 mg/dL      Peak postprandial:   less than 180 mg/dL (1-2 hours)      Critically ill patients:  140 - 180 mg/dL    Results for REGNALD, BOWENS (MRN [id number]) as of 10/27/2014 12:40  Ref. Range 10/26/2014 16:38 10/26/2014 21:30  Glucose-Capillary Latest Ref Range: 65-99 mg/dL 332 (H) 249 (H)    Results for JULIANO, MCEACHIN (MRN [id number]) as of 10/27/2014 12:40  Ref. Range 10/27/2014 08:35 10/27/2014 11:46  Glucose-Capillary Latest Ref Range: 65-99 mg/dL 213 (H) 204 (H)     Admit with: N/V and Abd Pain  History: DM, CKD, HTN  Home DM Meds: Lantus 25 units AM/ 20 units PM       Novolog 5 units tidwc       Metformin 500 mg daily  Current DM Orders: Lantus 12 units bid            Novolog Sensitive SSI (0-9 units) TID AC    MD- Please consider increasing Lantus to 15 units bid    Will follow Wyn Quaker RN, MSN, CDE Diabetes Coordinator Inpatient Glycemic Control Team Team Pager: [phone] (8a-5p)

## 2014-10-27 NOTE — Progress Notes (Addendum)
Subjective: Feels better today, He has not had any more episodes of vomiting overnight. He wants to drink and eat. Abdominal pain has resolved. No hematemesis.   Objective: Vital signs in last 24 hours: Filed Vitals:   10/26/14 2031 10/27/14 0107 10/27/14 0432 10/27/14 0839  BP: 188/98 171/93 176/86 187/86  Pulse: 86 107 101 110  Temp: 98 F (36.7 C) 98.2 F (36.8 C) 97.6 F (36.4 C) 98.2 F (36.8 C)  TempSrc: Oral Oral  Axillary  Resp: 20 20 21 18   Height: 5\' 8"  (1.727 m)     Weight: 174 lb 11.2 oz (79.243 kg)     SpO2: 100% 100% 99% 100%   Weight change:   Intake/Output Summary (Last 24 hours) at 10/27/14 1153 Last data filed at 10/27/14 1147  Gross per 24 hour  Intake 936.25 ml  Output   1260 ml  Net -323.75 ml   General appearance: alert, cooperative, appears stated age and no distress Lungs: clear to auscultation bilaterally Heart: regular rate and rhythm, S1, S2 normal, no murmur, click, rub or gallop Abdomen: soft, non-tender; bowel sounds normal; no masses,  no organomegaly Extremities: extremities normal, atraumatic, no cyanosis or edema  Lab Results: Basic Metabolic Panel:  Recent Labs Lab 10/26/14 1600 10/26/14 2340 10/27/14 0505  NA 139  --  140  K 3.8  --  3.6  CL 99*  --  103  CO2 26  --  23  GLUCOSE 354*  --  251*  BUN 19  --  15  CREATININE 1.36*  --  1.56*  CALCIUM 9.4  --  9.1  MG  --  2.0  --    Liver Function Tests:  Recent Labs Lab 10/26/14 1600  AST 25  ALT 16*  ALKPHOS 117  BILITOT 0.8  PROT 8.7*  ALBUMIN 4.4    Recent Labs Lab 10/26/14 1600  LIPASE 12*   CBC:  Recent Labs Lab 10/26/14 1600 10/27/14 0505  WBC 13.3* 18.2*  NEUTROABS 11.9*  --   HGB 11.9* 11.3*  HCT 37.3* 35.2*  MCV 89.2 88.9  PLT 567* 564*   Cardiac Enzymes:  Recent Labs Lab 10/26/14 2340  TROPONINI <0.03   CBG:  Recent Labs Lab 10/26/14 1638 10/26/14 2130 10/27/14 0835  GLUCAP 332* 249* 213*   Urine Drug Screen: Drugs of  Abuse     Component Value Date/Time   LABOPIA POSITIVE* 10/27/2014 0452   COCAINSCRNUR NONE DETECTED 10/27/2014 0452   COCAINSCRNUR NEG 09/22/2008 2125   LABBENZ NONE DETECTED 10/27/2014 0452   LABBENZ NEG 09/22/2008 2125   AMPHETMU NONE DETECTED 10/27/2014 0452   AMPHETMU NEG 09/22/2008 2125   THCU POSITIVE* 10/27/2014 0452   LABBARB NONE DETECTED 10/27/2014 0452    Alcohol Level: No results for input(s): ETH in the last 168 hours. Urinalysis:  Recent Labs Lab 10/26/14 1417  COLORURINE YELLOW  LABSPEC 1.030  PHURINE 5.5  GLUCOSEU >1000*  HGBUR MODERATE*  BILIRUBINUR NEGATIVE  KETONESUR 15*  PROTEINUR 30*  UROBILINOGEN 0.2  NITRITE NEGATIVE  LEUKOCYTESUR NEGATIVE   Medications: I have reviewed the patient's current medications. Scheduled Meds: . amLODipine  10 mg Oral Daily  . FLUoxetine  10 mg Oral Daily  . insulin aspart  0-9 Units Subcutaneous TID WC  . insulin glargine  12 Units Subcutaneous Q12H  . metoCLOPramide  10 mg Oral TID AC & HS  . pantoprazole (PROTONIX) IV  40 mg Intravenous Q12H  . ramelteon  8 mg Oral QHS  .  sucralfate  1 g Oral TID WC & HS   Continuous Infusions: . sodium chloride 75 mL/hr at 10/26/14 2355   PRN Meds:.acetaminophen **OR** acetaminophen, promethazine Assessment/Plan: Principal Problem:   Hematemesis with nausea Active Problems:   Diabetes mellitus type 2, uncontrolled, with complications   Essential hypertension   GERD (gastroesophageal reflux disease)   Anemia, iron deficiency   S/P bilateral BKA (below knee amputation)   Esophagitis (distal), erosive (09/2014)   Hematemesis  Gastroparesis with Coffee Ground Emesis: gastric emptying study- 2009, consistent with gastropareis. Has chronic uncontrolled DM2. Some mild hematemesis- likely from vomiting and irritation form esophagitis. Last EGD in June 2016 showed no evidence of PUD.  - Troponin neg - Cont Metoclopramide 10 mg PO TID, and cont on discharge - Switch Protonix 40  mg IV BID to PO - Phenergan - Sucralfate - Likley d/c today  DMII: Uncontrolled, with A1c 13.5% in June 2016. Home insulin regimen is Lantus 25 units qAM and 20 units qPM. He also recently started Metformin, which we will hold while admitted. - Lantus 12 unit BID, started this am - SSI - Has been NPO, CBG elevated.   HTN: Elevated. Home regimen currently Amlodipine 10 mg.Lisinopril 20 mg daily held at last clinic visit until Cr normalizes.Appeasr to be at baseline.  - Amlodipine 10 mg - Resume home lisinopril- provide renoprotection, follow up as out pt, consider BB.  Fe Def Anemia: Stable.  CKD2: Stable. Patient's Creatinine at recent low.  GERD: Switch to PO.  Depression: Prozac 10 mg   FEN/GI: - Tolerating PO  DVT Ppx: hold in setting of UGI bleed.  Dispo: Disposition is deferred at this time, awaiting improvement of current medical problems.  Anticipated discharge in approximately today.    Wesley Roys, MD 10/27/2014, 11:53 AM

## 2014-10-27 NOTE — Progress Notes (Signed)
Patient complaining of hiccups beginning about 1400, at 1559 patient was given 12.5mg  phenergan po for nausea, at 1627 patient asleep with nohiccups.

## 2014-10-28 ENCOUNTER — Other Ambulatory Visit: Payer: Self-pay | Admitting: Internal Medicine

## 2014-10-28 DIAGNOSIS — K920 Hematemesis: Secondary | ICD-10-CM | POA: Diagnosis not present

## 2014-10-28 DIAGNOSIS — Z89512 Acquired absence of left leg below knee: Principal | ICD-10-CM

## 2014-10-28 DIAGNOSIS — Z89511 Acquired absence of right leg below knee: Secondary | ICD-10-CM

## 2014-10-28 LAB — GLUCOSE, CAPILLARY
GLUCOSE-CAPILLARY: 236 mg/dL — AB (ref 65–99)
GLUCOSE-CAPILLARY: 274 mg/dL — AB (ref 65–99)
Glucose-Capillary: 180 mg/dL — ABNORMAL HIGH (ref 65–99)

## 2014-10-28 LAB — CBC
HCT: 35.1 % — ABNORMAL LOW (ref 39.0–52.0)
Hemoglobin: 11.2 g/dL — ABNORMAL LOW (ref 13.0–17.0)
MCH: 28 pg (ref 26.0–34.0)
MCHC: 31.9 g/dL (ref 30.0–36.0)
MCV: 87.8 fL (ref 78.0–100.0)
Platelets: 541 10*3/uL — ABNORMAL HIGH (ref 150–400)
RBC: 4 MIL/uL — ABNORMAL LOW (ref 4.22–5.81)
RDW: 13.3 % (ref 11.5–15.5)
WBC: 11.8 10*3/uL — ABNORMAL HIGH (ref 4.0–10.5)

## 2014-10-28 MED ORDER — GABAPENTIN 300 MG PO CAPS
600.0000 mg | ORAL_CAPSULE | Freq: Three times a day (TID) | ORAL | Status: DC
  Administered 2014-10-28 (×3): 600 mg via ORAL
  Filled 2014-10-28 (×5): qty 2

## 2014-10-28 MED ORDER — INSULIN GLARGINE 100 UNIT/ML ~~LOC~~ SOLN
18.0000 [IU] | Freq: Two times a day (BID) | SUBCUTANEOUS | Status: DC
  Filled 2014-10-28: qty 0.18

## 2014-10-28 MED ORDER — POLYETHYLENE GLYCOL 3350 17 G PO PACK: 17.0000 g | PACK | Freq: Every day | ORAL | Status: DC | PRN

## 2014-10-28 MED ORDER — METOCLOPRAMIDE HCL 10 MG PO TABS: 10.0000 mg | ORAL_TABLET | Freq: Three times a day (TID) | ORAL | Status: DC

## 2014-10-28 MED ORDER — LISINOPRIL 20 MG PO TABS: 20.0000 mg | ORAL_TABLET | Freq: Every day | ORAL | Status: AC

## 2014-10-28 MED ORDER — FERROUS SULFATE 325 (65 FE) MG PO TABS: 325.0000 mg | ORAL_TABLET | Freq: Every day | ORAL | Status: AC

## 2014-10-28 NOTE — Progress Notes (Signed)
  Date: 10/28/2014  Patient name: [name]  Medical record number: [id number]  Date of birth: 10-Aug-1965   This patient has been seen and the plan of care was discussed with the house staff. Please see their note for complete details. I concur with their findings with the following additions/corrections: Mr Birr  Is now taking clears and is not having any V although still mild N and ABD pain. He is on PPI, carafate, and reglan. Likely D/C today with [facility] F/U.  [name], MD 10/28/2014, 11:50 AM

## 2014-10-28 NOTE — Progress Notes (Signed)
Subjective: No vomiting since admission. Tolerating clears. Says he has some intermittent burning sensation when he gets aggravated.   Objective: Vital signs in last 24 hours: Filed Vitals:   10/27/14 1646 10/27/14 2041 10/28/14 0543 10/28/14 0754  BP: 169/111 163/79 155/99 160/80  Pulse: 106 82 103 86  Temp: 98.8 F (37.1 C) 98.9 F (37.2 C) 98.9 F (37.2 C) 98.6 F (37 C)  TempSrc: Oral Oral Oral Oral  Resp: 17 16 16 17   Height:      Weight:  175 lb 4.8 oz (79.514 kg)    SpO2: 100% 100% 100% 100%   Weight change: 9.6 oz (0.271 kg)  Intake/Output Summary (Last 24 hours) at 10/28/14 1131 Last data filed at 10/28/14 0920  Gross per 24 hour  Intake   1200 ml  Output    700 ml  Net    500 ml   General appearance: alert, cooperative, appears stated age and no distress, with bilateral BKA. Lungs: clear to auscultation bilaterally Heart: regular rate and rhythm, S1, S2 normal, no murmur, click, rub or gallop Abdomen: soft, non-tender; bowel sounds normal; no masses,  no organomegaly Extremities: extremities normal, atraumatic, no cyanosis or edema  Lab Results: Basic Metabolic Panel:  Recent Labs Lab 10/26/14 1600 10/26/14 2340 10/27/14 0505  NA 139  --  140  K 3.8  --  3.6  CL 99*  --  103  CO2 26  --  23  GLUCOSE 354*  --  251*  BUN 19  --  15  CREATININE 1.36*  --  1.56*  CALCIUM 9.4  --  9.1  MG  --  2.0  --    Liver Function Tests:  Recent Labs Lab 10/26/14 1600  AST 25  ALT 16*  ALKPHOS 117  BILITOT 0.8  PROT 8.7*  ALBUMIN 4.4    Recent Labs Lab 10/26/14 1600  LIPASE 12*   CBC:  Recent Labs Lab 10/26/14 1600 10/27/14 0505 10/28/14 0840  WBC 13.3* 18.2* 11.8*  NEUTROABS 11.9*  --   --   HGB 11.9* 11.3* 11.2*  HCT 37.3* 35.2* 35.1*  MCV 89.2 88.9 87.8  PLT 567* 564* 541*   Cardiac Enzymes:  Recent Labs Lab 10/26/14 2340  TROPONINI <0.03   CBG:  Recent Labs Lab 10/26/14 2130 10/27/14 0835 10/27/14 1146 10/27/14 1645  10/27/14 2111 10/28/14 0740  GLUCAP 249* 213* 204* 191* 189* 180*   Urine Drug Screen: Drugs of Abuse     Component Value Date/Time   LABOPIA POSITIVE* 10/27/2014 0452   COCAINSCRNUR NONE DETECTED 10/27/2014 0452   COCAINSCRNUR NEG 09/22/2008 2125   LABBENZ NONE DETECTED 10/27/2014 0452   LABBENZ NEG 09/22/2008 2125   AMPHETMU NONE DETECTED 10/27/2014 0452   AMPHETMU NEG 09/22/2008 2125   THCU POSITIVE* 10/27/2014 0452   LABBARB NONE DETECTED 10/27/2014 0452    Urinalysis:  Recent Labs Lab 10/26/14 1417  COLORURINE YELLOW  LABSPEC 1.030  PHURINE 5.5  GLUCOSEU >1000*  HGBUR MODERATE*  BILIRUBINUR NEGATIVE  KETONESUR 15*  PROTEINUR 30*  UROBILINOGEN 0.2  NITRITE NEGATIVE  LEUKOCYTESUR NEGATIVE   Medications: I have reviewed the patient's current medications. Scheduled Meds: . amLODipine  10 mg Oral Daily  . FLUoxetine  10 mg Oral Daily  . gabapentin  600 mg Oral 3 times per day  . insulin aspart  0-9 Units Subcutaneous TID WC  . insulin glargine  15 Units Subcutaneous Q12H  . lisinopril  20 mg Oral Daily  . metoCLOPramide  10 mg Oral TID AC & HS  . pantoprazole  40 mg Oral BID  . ramelteon  8 mg Oral QHS  . sucralfate  1 g Oral TID WC & HS   Continuous Infusions:   PRN Meds:.acetaminophen **OR** acetaminophen, promethazine Assessment/Plan: Principal Problem:   Hematemesis with nausea Active Problems:   Diabetes mellitus type 2, uncontrolled, with complications   Essential hypertension   GERD (gastroesophageal reflux disease)   Anemia, iron deficiency   S/P bilateral BKA (below knee amputation)   Esophagitis (distal), erosive (09/2014)   Hematemesis  Gastroparesis with Coffee Ground Emesis: No hematemesis or vomiting since admission. Hgb stable. Gastric emptying study- 2009, consistent with gastropareis.  Last EGD in June 2016 showed no evidence of PUD.  - Advance diet today, Full liq diet now, solid for lunch - Cont Metoclopramide 10 mg PO TID, and cont  on discharge - Switch Protonix 40 mg IV BID to PO - Phenergan - Sucralfate - D/c today  DMII: Uncontrolled, with A1c 13.5% in June 2016. Home insulin regimen is Lantus 25 units qAM and 20 units qPM.  - Lantus 15 units BID, can increase to 18u, CBGs- 190s, but pt is leaving today - SSI  HTN: Elevated. Home regimen currently Amlodipine 10 mg.Lisinopril 20 mg daily held at last clinic visit until Cr normalizes.Appeasr to be at baseline.  - Amlodipine 10 mg - Resume home lisinopril 20mg  daily provide renoprotection, follow up as out pt, consider [employer].  Fe Def Anemia: Cont Iron tablets on discharge. CKD2: Stable. Patient's Creatinine at recent low.  GERD: Cont PPI- Protonix 40mg  daily Depression: Cont Prozac 10 mg   FEN/GI: - Tolerating PO  DVT Ppx: hold in setting of UGI bleed.  Dispo: Disposition is deferred at this time, awaiting improvement of current medical problems.  Discharge today.  Bethena Roys, MD 10/28/2014, 11:31 AM

## 2014-10-28 NOTE — Evaluation (Signed)
Physical Therapy Evaluation Patient Details Name: [name] MRN: [id number] DOB: [birth year] Today's Date: 10/28/2014   History of Present Illness  Admitted with hematemesis  Past Medical History  Diagnosis Date  . Onychomycosis   . Hypertension   . Diabetic peripheral neuropathy   . Diabetic foot ulcer     s/p Right first ray amputation, left transmetatarsal amputation with revision  . History of drug abuse     Cocaine and marijuana  . Exposure to trichomonas     treated empirically  . Cellulitis of left foot 03/2008    left 4th and 5th metatarsal area  . Hyperlipidemia   . 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 Ulcerative esophagitis 2482    severe, complicated with UGI bleed  . Mallory [name] tear April 2009  . History of prolonged Q-T interval on ECG   . 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  . 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 CKD (chronic kidney disease) stage 2, GFR 60-89 ml/min     BL SCr 1.3-1.4  . Depression   . Peripheral vascular disease   . Type 2 diabetes mellitus, uncontrolled, with renal complications   . GERD (gastroesophageal reflux disease)   . 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 Cellulitis and abscess of leg 01/31/2014  . Osteomyelitis of left leg 01/21/2014  . History of left below knee amputation 01/21/2014    Performed on    . Status post below knee amputation of right lower extremity 03/23/2014   Past Surgical History  Procedure Laterality Date  . Toe amputation  06/2011    left; 4th toe  . Nephrectomy      L side  . I&d extremity  08/24/2011    Procedure: IRRIGATION  AND DEBRIDEMENT EXTREMITY;  Surgeon: Meredith Pel, MD;  Location: [place];  Service: Orthopedics;  Laterality: Left;  . Amputation  08/24/2011    Procedure: AMPUTATION RAY;  Surgeon: Meredith Pel, MD;  Location: [place];  Service: Orthopedics;  Laterality: Left;  left fourth toe Ray Resection  . Amputation  12/05/2011    Procedure: AMPUTATION RAY;  Surgeon: Meredith Pel, MD;  Location: [place];  Service: Orthopedics;  Laterality: Left;  . I&d extremity  02/18/2012    Procedure: IRRIGATION AND DEBRIDEMENT EXTREMITY;  Surgeon: Mcarthur Rossetti, MD;  Location: [place];  Service: Orthopedics;  Laterality: Left;  . Amputation  02/18/2012    Procedure: AMPUTATION DIGIT;  Surgeon: Mcarthur Rossetti, MD;  Location: [place];  Service: Orthopedics;  Laterality: Left;  revison transmetatarsal  . I&d extremity Left 08/28/2012    Procedure: IRRIGATION AND DEBRIDEMENT EXTREMITY;  Surgeon: Linna Hoff, MD;  Location: [place];  Service: Orthopedics;  Laterality: Left;  CYSTO TUBING/IRRIGATION, LEAD HAND.  . I&d extremity Left 08/30/2012    Procedure: IRRIGATION AND DEBRIDEMENT EXTREMITY;  Surgeon: Linna Hoff, MD;  Location: [place];  Service: Orthopedics;  Laterality: Left;  . Amputation Right 09/01/2012    Procedure: FOOT 1ST RAY AMPUTATION;  Surgeon: Newt Minion, MD;  Location: [place];  Service:  Orthopedics;  Laterality: Right;  . Amputation Left 09/01/2012    Procedure: FOOT REVISION TRANSMETATARSAL AMPUTATION ;  Surgeon: Newt Minion, MD;  Location: [place];  Service: Orthopedics;  Laterality: Left;  . Amputation Right 09/13/2012    Procedure: AMPUTATION BELOW KNEE;  Surgeon: Newt Minion, MD;  Location: [place];  Service: Orthopedics;  Laterality: Right;  Right Below Knee Amputation  . Amputation Left 12/12/2013    Procedure: Left Below Knee Amputation;  Surgeon: Newt Minion, MD;  Location: [place];  Service: Orthopedics;  Laterality: Left;  . Stump revision Left 01/23/2014    Procedure: STUMP  REVISION;  Surgeon: Newt Minion, MD;  Location: [place];  Service: Orthopedics;  Laterality: Left;  . Esophagogastroduodenoscopy (egd) with propofol N/A 09/16/2014    Procedure: ESOPHAGOGASTRODUODENOSCOPY (EGD) WITH PROPOFOL;  Surgeon: Wonda Horner, MD;  Location: [facility];  Service: Endoscopy;  Laterality: N/A;     Clinical Impression  Patient evaluated by Physical Therapy with no further acute PT needs identified. All education has been completed and the patient has no further questions.   [name] is well-versed in managing with bil BKAs and prostheses; We did not owrk on walking as his prostheses were not here, but he performs bed mobility and scoot transfers quite well;  See below for any follow-up Physical Therapy or equipment needs. PT is signing off. Thank you for this referral.     Follow Up Recommendations Outpatient PT;Other (comment) (When Medicaid renews PT visits)    Equipment Recommendations  None recommended by PT    Recommendations for Other Services Other (comment)  [name] described many stressors in his life related to financial and housing pressure, family pressure, conflict with spouse; Is there any Mental Health or counseling follow up available to him? -- it may be also worth considering a closer look at his alcohol use and provide referrals or counseling as needed.     Precautions / Restrictions Precautions Precautions: None      Mobility  Bed Mobility Overal bed mobility: Independent                Transfers Overall transfer level: Modified independent Equipment used: None             General transfer comment: Performs simulated lateral press/scoot transfer in bed with no difficulty  Ambulation/Gait                Stairs            Wheelchair Mobility    Modified Rankin (Stroke Patients Only)       Balance   Sitting-balance support: No upper extremity supported Sitting balance-Leahy Scale: Normal          Standing balance comment: Reports uses [employer]strand crutches for support when standing with prostheses                             Pertinent Vitals/Pain Pain Assessment: No/denies pain    Home Living Family/patient expects to be discharged to:: Private residence Living Arrangements: Spouse/significant other;Other relatives Available Help at Discharge: Family;Available 24 hours/day Type of Home: House Home Access: Ramped entrance     Home Layout: One level Home Equipment: Wheelchair - [employer] - 2 wheels;Bedside commode (Lofstrand crutches)      Prior Function Level of Independence: Independent with assistive device(s)         Comments: Walks with bil prostheses and Lofstrand crutches; is well  versed in mobility and walking with prostheses     Hand Dominance   Dominant Hand: Right    Extremity/Trunk Assessment   Upper Extremity Assessment: Overall WFL for tasks assessed           Lower Extremity Assessment:  (Bilateral BKAs; hip and knee WFL for prosthesis use; pt reports he performs therapeutic exercises his Outpatient PT has given to him)         Communication   Communication: No difficulties  Cognition Arousal/Alertness: Awake/alert Behavior During Therapy: WFL for tasks assessed/performed Overall Cognitive Status: Within Functional Limits for tasks assessed                      General Comments      Exercises        Assessment/Plan    PT Assessment All further PT needs can be met in the next venue of care  PT Diagnosis Difficulty walking   PT Problem List Decreased balance;Decreased mobility  PT Treatment Interventions     PT Goals (Current goals can be found in the Care Plan section) Acute Rehab PT Goals Patient Stated Goal: Has a lot on his mind; lots of stressors in his life PT Goal Formulation: All assessment and education complete, DC therapy    Frequency     Barriers to discharge        Co-evaluation                End of Session   Activity Tolerance: Patient tolerated treatment well Patient left: in bed;with call bell/phone within reach Nurse Communication: Mobility status    Functional Assessment Tool Used: clinical judgement Functional Limitation: Mobility: Walking and moving around Mobility: Walking and Moving Around Current Status (F2761): 0 percent impaired, limited or restricted Mobility: Walking and Moving Around Goal Status [phone]): 0 percent impaired, limited or restricted Mobility: Walking and Moving Around Discharge Status [phone]): 0 percent impaired, limited or restricted    Time: 3403-7096 PT Time Calculation (min) (ACUTE ONLY): 24 min   Charges:   PT Evaluation $Initial PT Evaluation Tier I: 1 Procedure PT Treatments $Therapeutic Activity: 8-22 mins   PT G Codes:   PT G-Codes **NOT FOR INPATIENT CLASS** Functional Assessment Tool Used: clinical judgement Functional Limitation: Mobility: Walking and moving around Mobility: Walking and Moving Around Current Status (K3838): 0 percent impaired, limited or restricted Mobility: Walking and Moving Around Goal Status (F8403): 0 percent impaired, limited or restricted Mobility: Walking and Moving Around Discharge Status [phone]): 0 percent impaired, limited or restricted    Roney Marion [facility] 10/28/2014, 3:51 PM  Roney Marion, [place] Pager [phone] Office [phone]

## 2014-10-28 NOTE — Discharge Summary (Signed)
Name: [name] MRN: [id number] DOB: Sep 29, 1965 49 y.o. PCP: [name], MD  Date of Admission: 10/26/2014  1:06 PM Date of Discharge: 10/28/2014 Attending Physician: [name], MD  Discharge Diagnosis:  Principal Problem:   Hematemesis with nausea Active Problems:   Diabetes mellitus type 2, uncontrolled, with complications   Essential hypertension   GERD (gastroesophageal reflux disease)   Anemia, iron deficiency   S/P bilateral BKA (below knee amputation)   Esophagitis (distal), erosive (09/2014)   Hematemesis  Discharge Medications:   Medication List    TAKE these medications        ACCU-CHEK FASTCLIX LANCETS Misc  1 each by Does not apply route 4 (four) times daily -  before meals and at bedtime.     acetaminophen-codeine 300-30 MG per tablet  Commonly known as:  TYLENOL #3  Take 1-2 tablets by mouth every 4 (four) hours as needed for moderate pain.     amLODipine 10 MG tablet  Commonly known as:  NORVASC  Take 0.5 tablets (5 mg total) by mouth daily.     EASY COMFORT INSULIN SYRINGE 31G X 5/16" 1 ML Misc  Generic drug:  Insulin Syringe-Needle U-100  Use as directed daily for insulin injection.     feeding supplement (GLUCERNA SHAKE) Liqd  Take 237 mLs by mouth 2 (two) times daily between meals.     ferrous sulfate 325 (65 FE) MG tablet  Take 1 tablet (325 mg total) by mouth daily with breakfast.     FLUoxetine 10 MG capsule  Commonly known as:  PROZAC  Take 1 capsule (10 mg total) by mouth daily.     folic acid 1 MG tablet  Commonly known as:  FOLVITE  Take 1 tablet (1 mg total) by mouth daily.     gabapentin 600 MG tablet  Commonly known as:  NEURONTIN  Take 1 tablet (600 mg total) by mouth 3 (three) times daily.     glucose blood test strip  Commonly known as:  ACCU-CHEK SMARTVIEW  1 each by Other route 4 (four) times daily -  before meals and at bedtime. Use as instructed     insulin aspart 100 UNIT/ML injection  Commonly  known as:  novoLOG  Inject 5 Units into the skin 3 (three) times daily with meals.     insulin glargine 100 UNIT/ML injection  Commonly known as:  LANTUS  Inject 0.45 mLs (45 Units total) into the skin daily. Inject 25 units in the morning, and 20 units in the evening, 12 hours apart     lisinopril 20 MG tablet  Commonly known as:  PRINIVIL,ZESTRIL  Take 1 tablet (20 mg total) by mouth daily.     metFORMIN 500 MG tablet  Commonly known as:  GLUCOPHAGE  Take 1 tablet (500 mg total) by mouth daily with breakfast.     metoCLOPramide 10 MG tablet  Commonly known as:  REGLAN  Take 1 tablet (10 mg total) by mouth 4 (four) times daily -  before meals and at bedtime.     nicotine 14 mg/24hr patch  Commonly known as:  NICODERM CQ - dosed in mg/24 hours  Place 1 patch onto the skin daily.     pantoprazole 40 MG tablet  Commonly known as:  PROTONIX  Take 1 tablet (40 mg total) by mouth 2 (two) times daily.     polyethylene glycol packet  Commonly known as:  MIRALAX / GLYCOLAX  Take 17 g by mouth daily  as needed.     sildenafil 20 MG tablet  Commonly known as:  REVATIO  Take 2-5 tablets before sexual activity.     sucralfate 1 GM/10ML suspension  Commonly known as:  CARAFATE  Take 10 mLs (1 g total) by mouth 4 (four) times daily -  with meals and at bedtime.        Disposition and follow-up:   [name] was discharged from [facility] in Good condition.  At the hospital follow up visit please address:  1. Resolution of hematemesis and symptomatic relief of nausea and vomiting; HTN management; DMII management; hematuria   2. Labs / imaging needed at time of follow-up: Bmet - creatinine; repeat urinalysis- moderate Hgb in Urine on admission.  3. Pending labs/ test needing follow-up: none   Follow-up Appointments:     Follow-up Information    Follow up with Juluis Mire, MD On 11/03/2014.   Specialty:  Internal Medicine   Why:  at 2:45pm   Contact  information:   [place] [id number] [phone]       Discharge Instructions: Discharge Instructions    Diet - low sodium heart healthy    Complete by:  As directed      Discharge instructions    Complete by:  As directed   1) You have a hospital follow-up appointment with Dr. Naaman Plummer at the [place] on 11/03/14 at 2:45pm.   2) You have been started on the following medications: - Reglan 10mg  by mouth three times a before meals and bedtime - for nausea/vomiting - lisinopril 20mg  by mouth daily - for high blood pressure - You can start taking your iron tablets once a day, rather than three times a day.  3) Please resume taking all your other medications as prescribed prior to your hospitalization. Check your blood sugars three time a day at least and bring your glucometer to clinic today.     Increase activity slowly    Complete by:  As directed            Consultations:  None  Procedures Performed:  No results found.  2D Echo: None  Cardiac Cath: None  H&P by Roma Kayser A. Lovena Le MD.  Admission HPI: History of Present Illness: [name] is a 49 yo male with uncontrolled DM (HbA1c 13.5 on 09/2014), gastroparesis, HTN, CKD2, GERD with distal esophagitis, and depression. He presents with a one day h/o N/V and abdominal pain. The pain started last night after drinking 2 beers. Since then, he has had worsening nausea and vomiting, reporting coffee ground emesis yesterday. Since then, he has vomited anything he has put in his stomach. He denies hematemesis, melena, or hematochezia. He also reports 9/10, stabbing abdominal pain, located in the epigastric region. It radiates to the LUQ, but not the back. When the pain comes, he breaks out in a sweat, vomits, and the pain subsides. He endorses this is similar to his previous episodes of gastroparesis. He takes Protonix BID at home. In addition to alcohol, he reports that stress and spicy  foods also worsen his abdominal pain and nausea/vomiting. There is no change with fatty foods. He is an everyday user of marijuana when it is available. He smokes 1 ppd.  He endorses orthostasis when sitting up, periodic SOB, subjective weight loss (though he hasn't weighed himself), and chronic constipation. He denies fever, chills, cough, chest pain, diarrhea, or sick contacts.  He has a history  of alcoholic pancreatitis. His last drink was yesterday. He has a h/o Mallory-Weiss tear. His last EGD was 09/2014, noting distal erosive esophagitis and a small hiatal hernia. His last BM was today. He reports constipation at baseline, only having a BM once/week. He takes Castor oil to help have a BM.  In the ED, his emesis was positive for occult blood.   Physical Exam: Blood pressure 188/98, pulse 86, temperature 98 F (36.7 C), temperature source Oral, resp. rate 20, height 5\' 8"  (1.727 m), weight 174 lb 11.2 oz (79.243 kg), SpO2 100 %. Physical Exam  Constitutional: He is oriented to person, place, and time and well-developed, well-nourished, and in no distress.  HENT:  Head: Normocephalic and atraumatic.  MM dry.  Eyes: EOM are normal.  Neck: Normal range of motion. No tracheal deviation present.  Cardiovascular: Normal rate, regular rhythm and normal heart sounds.  Pulmonary/Chest: Effort normal and breath sounds normal. No respiratory distress. He has no wheezes.  Abdominal: Soft. Bowel sounds are normal. He exhibits no distension. There is no tenderness. There is no rebound and no guarding.  Musculoskeletal:  B/l BKA  Neurological: He is alert and oriented to person, place, and time.  Skin: Skin is warm and dry.   Hospital Course by problem list:   Hematemesis with abdominal pain- Likely 2/2 to gastroparesis. Lab work- Bmet- Cr 1.56 at pts baseline, K- 3.6, CBC with a leukocytosis of 18.2 that trended down to 11.8, UDS positive for THC and Opiates, Trop X1 neg, normal Mag,  Lipase- 12. Cbg- 200s. UA- moderate Hgb, ketones and protein. Pt was placed on bowel rest and hydrated, diet was advanced, as symptoms improved. He had no more episodes during admission. He was also started on IV Reglan 10mg  by mouth three times daily and transitioned to PO. Patient had GES (02/2007) which showed abnormally low gastric emptying (gastric retention at 1 hour was 89% and 56% at 2 hours) consistent with gastroparesis and explained by patient's chronic uncontrolled DM. His last EGD (09/16/2014) showed no evidence of PUD, but did show distal erosive esophagitis and a small hiatal hernia with otherwise normal mucosa. His hemoglobin has been stable at 11.2 at discharge.  His outpatient PCP will need to re-evaluate the need for Reglan in 3 months to determine if benefits greater than risks, and due to risk of Extrapyramidal symptoms as an adverse effect.   2. HTN. Patient's systolic BPs while hospitalized ranged in the 160s to 190s. His home Amlodipine 10 mg daily was continued. Patient had previously been on Lisinopril 20 mg daily which was held in the setting of AKI. His last creatinine on 10/27/14 measured 1.56, around his baseline creatinine of 1.4 - 1.6. Lisinopril was resumed-20mg  daily, Amlodipine 10mg  continued. Consider addition of HCTZ or beta-blocker as an outpatient.   3. CKD stage 2-3. Stable. Last creatinine during admission 1.56. Historical baseline around 1.4 to 1.6.   4. Abnormal urinalysis. Patient has a history of left nephrectomy due to chronic nephritis. Urinalysis significant for moderate blood. Further workup of hematuria is deferred to PCP.   5. DMII. Uncontrolled last A1c 13.5% in June 2016. His home insulin regimen is Lantus 25 units qAM and 20 units qPM. He also recently started metformin (held during admission and resumed at discharge). Notably, patient has had bilateral below the knee amputations secondary to his uncontrolled diabetes. PT evaluation- Recommended home Pt  to be resumed when patients medicaid  was asked to evaluate patient during hospitalization and they recommended  home health PT when Medicaid renews visits.   6. Iron deficiency anemia. Stable. Resume iron at discharge.   7. GERD. Resume Protonix 40mg  by mouth twice daily was continued at discharge.   8. Depression. Patient was continued on Prozac 10 mg daily.   Discharge Vitals:   BP 160/80 mmHg  Pulse 86  Temp(Src) 98.6 F (37 C) (Oral)  Resp 17  Ht 5\' 8"  (1.727 m)  Wt 175 lb 4.8 oz (79.514 kg)  BMI 26.66 kg/m2  SpO2 100%  Discharge Labs:  Results for orders placed or performed during the hospital encounter of 10/26/14 (from the past 24 hour(s))  Glucose, capillary     Status: Abnormal   Collection Time: 10/27/14  4:45 PM  Result Value Ref Range   Glucose-Capillary 191 (H) 65 - 99 mg/dL  Glucose, capillary     Status: Abnormal   Collection Time: 10/27/14  9:11 PM  Result Value Ref Range   Glucose-Capillary 189 (H) 65 - 99 mg/dL  Glucose, capillary     Status: Abnormal   Collection Time: 10/28/14  7:40 AM  Result Value Ref Range   Glucose-Capillary 180 (H) 65 - 99 mg/dL  CBC     Status: Abnormal   Collection Time: 10/28/14  8:40 AM  Result Value Ref Range   WBC 11.8 (H) 4.0 - 10.5 K/uL   RBC 4.00 (L) 4.22 - 5.81 MIL/uL   Hemoglobin 11.2 (L) 13.0 - 17.0 g/dL   HCT 35.1 (L) 39.0 - 52.0 %   MCV 87.8 78.0 - 100.0 fL   MCH 28.0 26.0 - 34.0 pg   MCHC 31.9 30.0 - 36.0 g/dL   RDW 13.3 11.5 - 15.5 %   Platelets 541 (H) 150 - 400 K/uL  Glucose, capillary     Status: Abnormal   Collection Time: 10/28/14 11:10 AM  Result Value Ref Range   Glucose-Capillary 236 (H) 65 - 99 mg/dL   Signed: Bethena Roys, MD 10/28/2014, 2:19 PM   Services Ordered on Discharge: Pt to be resumed when [facility] renews visits. Equipment Ordered on Discharge: None.

## 2014-10-28 NOTE — Progress Notes (Signed)
Subjective: Patient denying emesis since admission. Patient currently tolerating clear liquid diet. Patient states that his recurrent episodes of N/V are associated with stress/irritation.   Objective: Vital signs in last 24 hours: Filed Vitals:   10/27/14 1346 10/27/14 1646 10/27/14 2041 10/28/14 0543  BP: 161/82 169/111 163/79 155/99  Pulse:  106 82 103  Temp:  98.8 F (37.1 C) 98.9 F (37.2 C) 98.9 F (37.2 C)  TempSrc:  Oral Oral Oral  Resp:  17 16 16   Height:      Weight:   79.514 kg (175 lb 4.8 oz)   SpO2:  100% 100% 100%   Weight change: 0.271 kg (9.6 oz)  Intake/Output Summary (Last 24 hours) at 10/28/14 0736 Last data filed at 10/27/14 2041  Gross per 24 hour  Intake   1080 ml  Output    910 ml  Net    170 ml   General Appearance: Alert and talkative. Cooperative with exam. NAD. Head: [place]/AT Mouth: Dry MM Lungs: Clear to auscultation bilaterally, respirations unlabored Heart: Regular rate and rhythm, S1 and S2 normal, no murmur, rub or gallop Abdomen: Mild TTP over epigastrium. Soft, +BS, no masses, no organomegaly Extremities: No lower extremity edema. Pulses: 2+ radial and DP pulses MSK: bilateral BKA  Labs:  CBC Latest Ref Rng 10/28/2014 10/27/2014 10/26/2014  WBC 4.0 - 10.5 K/uL 11.8(H) 18.2(H) 13.3(H)  Hemoglobin 13.0 - 17.0 g/dL 11.2(L) 11.3(L) 11.9(L)  Hematocrit 39.0 - 52.0 % 35.1(L) 35.2(L) 37.3(L)  Platelets 150 - 400 K/uL 541(H) 564(H) 567(H)    Imaging: No new imaging  Medications:  Scheduled Medications: . amLODipine  10 mg Oral Daily  . FLUoxetine  10 mg Oral Daily  . gabapentin  600 mg Oral 3 times per day  . insulin aspart  0-9 Units Subcutaneous TID WC  . insulin glargine  15 Units Subcutaneous Q12H  . lisinopril  20 mg Oral Daily  . metoCLOPramide  10 mg Oral TID AC & HS  . pantoprazole  40 mg Oral BID  . ramelteon  8 mg Oral QHS  . sucralfate  1 g Oral TID WC & HS    PRN Medications: acetaminophen **OR** acetaminophen,  promethazine    Assessment/Plan: Principal Problem:   Hematemesis with nausea Active Problems:   Diabetes mellitus type 2, uncontrolled, with complications   Essential hypertension   GERD (gastroesophageal reflux disease)   Anemia, iron deficiency   S/P bilateral BKA (below knee amputation)   Esophagitis (distal), erosive (09/2014)   Hematemesis  [name] is a 49 yo male with uncontrolled DMII (HbA1c 13.5 on 09/2014), gastroparesis, HTN, CKD2, GERD with distal esophagitis, and depression, presenting with N/V and abdominal pain c/w with gastroparesis.  #Gastroparesis with Coffee Ground Emesis: Hemoglobin 11.3 -> 11.2 this morning. Stable and at baseline. No emesis during hospitalization. Nausea improved.  - Cont metoclopramide 10 mg PO TID; cont at discharge - Cont Protonix 40 mg PO BID - Phenergan - Sucralfate - d/c today presuming tolerating diet  #Leukocytosis: Resolved. WBC 18.2 -> 11.8. No fever or signs/sx of infection. Likely reactive.  #DMII: Uncontrolled, with A1c 13.5% in June 2016. Home insulin regimen is Lantus 25 units qAM and 20 units qPM. He also recently started Metformin, which we will hold while admitted. - Lantus 15 unit BID, can increase to 18U. CBGs 180s - 200s. Will resume home insulin regimen per above after d/c today - SSI  #HTN: Elevated (systolics 073-710G). Home regimen currently Amlodipine 10 mg daily. Lisinopril 20mg  daily  restarted given Cr now around baseline.   - Continue Amlodipine 10 mg daily - Continue Lisinopril 20mg  daily. Consider addition of HCTZ or beta-blocker if HTN not responsive as an outpatient.   #Fe Def Anemia: Stable. Hold Fe in setting of GI distress. Restart at discharge.  #CKD2: Stable. He has a history of left nephrectomy due to chronic nephritis. UA significant for moderate blood with 7-10 RBCs/HPF. Defer to PCP for further work-up.  #GERD: Transition to PO Protonix 40 mg BID  #Depression: Continue Prozac 10 mg    #FEN/GI/PPx - Diet = Advance as tolerated; currently tolerating clears - Fluids = None - DVT ppx = hold in setting of UGI bleed; SCDs  #Code Status - Full code   #Disposition - Anticipated discharge today. - The patient does have a current PCP Wesley Estelle, MD) and does need an [facility] hospital follow-up appointment after discharge.  Scheduled for 11/03/14.  SERVICE NEEDED AT [place]         Y = Yes, Blank = No PT:   OT:   RN:   Equipment:   Other:    Length of Stay:  day(s)  This is a Careers information officer Note.  The care of the patient was discussed with Dr. Denton Brick and the assessment and plan formulated with their assistance.  Please see their attached note for official documentation of the daily encounter.     Quita Skye, Med Student 10/28/2014, 7:36 AM

## 2014-11-03 ENCOUNTER — Ambulatory Visit: Payer: Medicaid Other | Admitting: Internal Medicine

## 2014-11-14 ENCOUNTER — Other Ambulatory Visit: Payer: Self-pay | Admitting: Internal Medicine

## 2014-11-16 NOTE — Telephone Encounter (Signed)
Just filled in July but only got 90 but med QID. Will change quantity and re-send

## 2014-12-07 ENCOUNTER — Telehealth: Payer: Self-pay | Admitting: Licensed Clinical Social Worker

## 2014-12-07 NOTE — Telephone Encounter (Signed)
Per Mr. Berkland's request, CSW placed call to DSS to schedule pt's transportation for 12/21/14 at [facility].  Change in policy for transportation, pt aware CSW can only leave message requesting transportation.  Transportation will notify pt of pick up time within the next 24-48 hours.  Mr. Heldman aware.

## 2014-12-21 ENCOUNTER — Encounter: Payer: Medicaid Other | Admitting: Internal Medicine

## 2015-01-05 ENCOUNTER — Emergency Department (HOSPITAL_COMMUNITY)
Admission: EM | Admit: 2015-01-05 | Discharge: 2015-01-05 | Disposition: A | Discharge status: Home / Self Care | Payer: Medicaid Other | Attending: Emergency Medicine | Admitting: Emergency Medicine

## 2015-01-05 ENCOUNTER — Encounter (HOSPITAL_COMMUNITY): Payer: Self-pay | Admitting: *Deleted

## 2015-01-05 DIAGNOSIS — I129 Hypertensive chronic kidney disease with stage 1 through stage 4 chronic kidney disease, or unspecified chronic kidney disease: Secondary | ICD-10-CM | POA: Diagnosis not present

## 2015-01-05 DIAGNOSIS — R739 Hyperglycemia, unspecified: Secondary | ICD-10-CM

## 2015-01-05 DIAGNOSIS — L509 Urticaria, unspecified: Secondary | ICD-10-CM

## 2015-01-05 DIAGNOSIS — N182 Chronic kidney disease, stage 2 (mild): Secondary | ICD-10-CM | POA: Insufficient documentation

## 2015-01-05 DIAGNOSIS — E1165 Type 2 diabetes mellitus with hyperglycemia: Secondary | ICD-10-CM | POA: Diagnosis not present

## 2015-01-05 DIAGNOSIS — Z89511 Acquired absence of right leg below knee: Secondary | ICD-10-CM | POA: Insufficient documentation

## 2015-01-05 DIAGNOSIS — E1142 Type 2 diabetes mellitus with diabetic polyneuropathy: Secondary | ICD-10-CM | POA: Diagnosis not present

## 2015-01-05 DIAGNOSIS — Z79899 Other long term (current) drug therapy: Secondary | ICD-10-CM | POA: Insufficient documentation

## 2015-01-05 DIAGNOSIS — Z8619 Personal history of other infectious and parasitic diseases: Secondary | ICD-10-CM | POA: Insufficient documentation

## 2015-01-05 DIAGNOSIS — F329 Major depressive disorder, single episode, unspecified: Secondary | ICD-10-CM | POA: Diagnosis not present

## 2015-01-05 DIAGNOSIS — Z8719 Personal history of other diseases of the digestive system: Secondary | ICD-10-CM | POA: Insufficient documentation

## 2015-01-05 DIAGNOSIS — K219 Gastro-esophageal reflux disease without esophagitis: Secondary | ICD-10-CM | POA: Diagnosis not present

## 2015-01-05 DIAGNOSIS — R21 Rash and other nonspecific skin eruption: Secondary | ICD-10-CM | POA: Diagnosis present

## 2015-01-05 DIAGNOSIS — Z720 Tobacco use: Secondary | ICD-10-CM | POA: Insufficient documentation

## 2015-01-05 DIAGNOSIS — Z794 Long term (current) use of insulin: Secondary | ICD-10-CM | POA: Diagnosis not present

## 2015-01-05 LAB — I-STAT VENOUS BLOOD GAS, ED
Acid-base deficit: 2 mmol/L (ref 0.0–2.0)
Bicarbonate: 22.7 mEq/L (ref 20.0–24.0)
O2 Saturation: 93 %
TCO2: 24 mmol/L (ref 0–100)
pCO2, Ven: 37.4 mmHg — ABNORMAL LOW (ref 45.0–50.0)
pH, Ven: 7.391 — ABNORMAL HIGH (ref 7.250–7.300)
pO2, Ven: 68 mmHg — ABNORMAL HIGH (ref 30.0–45.0)

## 2015-01-05 LAB — CBG MONITORING, ED
Glucose-Capillary: 512 mg/dL — ABNORMAL HIGH (ref 65–99)
Glucose-Capillary: 293 mg/dL — ABNORMAL HIGH (ref 65–99)

## 2015-01-05 LAB — BASIC METABOLIC PANEL
Anion gap: 9 (ref 5–15)
BUN: 17 mg/dL (ref 6–20)
CO2: 22 mmol/L (ref 22–32)
Calcium: 8.7 mg/dL — ABNORMAL LOW (ref 8.9–10.3)
Chloride: 99 mmol/L — ABNORMAL LOW (ref 101–111)
Creatinine, Ser: 1.7 mg/dL — ABNORMAL HIGH (ref 0.61–1.24)
GFR calc Af Amer: 53 mL/min — ABNORMAL LOW (ref >60)
GFR calc non Af Amer: 46 mL/min — ABNORMAL LOW (ref >60)
Glucose, Bld: 585 mg/dL (ref 65–99)
Potassium: 4.4 mmol/L (ref 3.5–5.1)
Sodium: 130 mmol/L — ABNORMAL LOW (ref 135–145)

## 2015-01-05 LAB — CBC
HCT: 33.9 % — ABNORMAL LOW (ref 39.0–52.0)
Hemoglobin: 11 g/dL — ABNORMAL LOW (ref 13.0–17.0)
MCH: 27.8 pg (ref 26.0–34.0)
MCHC: 32.4 g/dL (ref 30.0–36.0)
MCV: 85.6 fL (ref 78.0–100.0)
Platelets: 345 10*3/uL (ref 150–400)
RBC: 3.96 MIL/uL — ABNORMAL LOW (ref 4.22–5.81)
RDW: 14.6 % (ref 11.5–15.5)
WBC: 14.4 10*3/uL — ABNORMAL HIGH (ref 4.0–10.5)

## 2015-01-05 MED ORDER — INSULIN ASPART 100 UNIT/ML ~~LOC~~ SOLN
6.0000 [IU] | Freq: Once | SUBCUTANEOUS | Status: AC
  Administered 2015-01-05: 6 [IU] via INTRAVENOUS
  Filled 2015-01-05: qty 1

## 2015-01-05 MED ORDER — SODIUM CHLORIDE 0.9 % IV BOLUS (SEPSIS)
1000.0000 mL | Freq: Once | INTRAVENOUS | Status: AC
  Administered 2015-01-05: 1000 mL via INTRAVENOUS

## 2015-01-05 NOTE — Discharge Instructions (Signed)
Take Benadryl for any itching and rash.  Return here as needed.  Follow-up with your primary care doctor

## 2015-01-05 NOTE — ED Provider Notes (Signed)
CSN: [id number]     Arrival date & time 01/05/15  0415 History   First MD Initiated Contact with Patient 01/05/15 [phone]     Chief Complaint  Patient presents with  . Allergic Reaction     (Consider location/radiation/quality/duration/timing/severity/associated sxs/prior Treatment) HPI Patient presents to the emergency department with small hive rash noted to the left lateral buttocks extending around to the hip and upper thigh region and lower abdomen.  Patient states that he is not sure of any contacts that would cause this.  He states he did not have any shortness of breath, wheezing, nausea, vomiting, pain, chest pain, shortness of breath, difficulty swallowing, tongue swelling, or syncope.  Patient states he did not take any medications prior to arrival.  The patient was given epinephrine, Solu-Medrol, Zantac and Benadryl by EMS.  Patient states that he feels like his symptoms are improving Past Medical History  Diagnosis Date  . Onychomycosis   . Hypertension   . Diabetic peripheral neuropathy   . Diabetic foot ulcer     s/p Right first ray amputation, left transmetatarsal amputation with revision  . History of drug abuse     Cocaine and marijuana  . Exposure to trichomonas     treated empirically  . Cellulitis of left foot 03/2008    left 4th and 5th metatarsal area  . Hyperlipidemia   . 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 Mallory [name] tear April 2009  . History of prolonged Q-T interval on ECG   . 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  . 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 CKD (chronic kidney disease) stage 2, GFR 60-89 ml/min     BL SCr 1.3-1.4  . Depression   . Peripheral vascular disease   . Type 2 diabetes mellitus, uncontrolled, with renal complications    . GERD (gastroesophageal reflux disease)   . 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 Cellulitis and abscess of leg 01/31/2014  . Osteomyelitis of left leg 01/21/2014  . History of left below knee amputation 01/21/2014    Performed on    . Status post below knee amputation of right lower extremity 03/23/2014   Past Surgical History  Procedure Laterality Date  . Toe amputation  06/2011    left; 4th toe  . Nephrectomy      L side  . I&d extremity  08/24/2011    Procedure: IRRIGATION AND DEBRIDEMENT EXTREMITY;  Surgeon: Meredith Pel, MD;  Location: [place];  Service: Orthopedics;  Laterality: Left;  . Amputation  08/24/2011    Procedure: AMPUTATION RAY;  Surgeon: Meredith Pel, MD;  Location: [place];  Service: Orthopedics;  Laterality: Left;  left fourth toe Ray Resection  . Amputation  12/05/2011    Procedure: AMPUTATION RAY;  Surgeon: Meredith Pel, MD;  Location: [place];  Service: Orthopedics;  Laterality: Left;  . I&d extremity  02/18/2012    Procedure: IRRIGATION AND DEBRIDEMENT EXTREMITY;  Surgeon: Mcarthur Rossetti, MD;  Location: [place];  Service:  Orthopedics;  Laterality: Left;  . Amputation  02/18/2012    Procedure: AMPUTATION DIGIT;  Surgeon: Mcarthur Rossetti, MD;  Location: [place];  Service: Orthopedics;  Laterality: Left;  revison transmetatarsal  . I&d extremity Left 08/28/2012    Procedure: IRRIGATION AND DEBRIDEMENT EXTREMITY;  Surgeon: Linna Hoff, MD;  Location: [place];  Service: Orthopedics;  Laterality: Left;  CYSTO TUBING/IRRIGATION, LEAD HAND.  . I&d extremity Left 08/30/2012    Procedure: IRRIGATION AND DEBRIDEMENT EXTREMITY;  Surgeon: Linna Hoff, MD;  Location: [place];  Service: Orthopedics;  Laterality: Left;  .  Amputation Right 09/01/2012    Procedure: FOOT 1ST RAY AMPUTATION;  Surgeon: Newt Minion, MD;  Location: [place];  Service: Orthopedics;  Laterality: Right;  . Amputation Left 09/01/2012    Procedure: FOOT REVISION TRANSMETATARSAL AMPUTATION ;  Surgeon: Newt Minion, MD;  Location: [place];  Service: Orthopedics;  Laterality: Left;  . Amputation Right 09/13/2012    Procedure: AMPUTATION BELOW KNEE;  Surgeon: Newt Minion, MD;  Location: [place];  Service: Orthopedics;  Laterality: Right;  Right Below Knee Amputation  . Amputation Left 12/12/2013    Procedure: Left Below Knee Amputation;  Surgeon: Newt Minion, MD;  Location: [place];  Service: Orthopedics;  Laterality: Left;  . Stump revision Left 01/23/2014    Procedure: STUMP REVISION;  Surgeon: Newt Minion, MD;  Location: [place];  Service: Orthopedics;  Laterality: Left;  . Esophagogastroduodenoscopy (egd) with propofol N/A 09/16/2014    Procedure: ESOPHAGOGASTRODUODENOSCOPY (EGD) WITH PROPOFOL;  Surgeon: Wonda Horner, MD;  Location: [facility] ENDOSCOPY;  Service: Endoscopy;  Laterality: N/A;   Family History  Problem Relation Age of Onset  . Diabetes type II Mother   . Pancreatic cancer Father    Social History  Substance Use Topics  . Smoking status: Current Every Day Smoker -- 2.00 packs/day for 34 years    Types: Cigarettes, Cigars  . Smokeless tobacco: Never Used     Comment: 1/2 PPD  . Alcohol Use: 0.0 oz/week    0 Standard drinks or equivalent per week     Comment: occ    Review of Systems  All other systems negative except as documented in the HPI. All pertinent positives and negatives as reviewed in the HPI.  Allergies  Review of patient's allergies indicates no known allergies.  Home Medications   Prior to Admission medications   Medication Sig Start Date End Date Taking? Authorizing Provider  ACCU-CHEK FASTCLIX LANCETS MISC 1 each by Does not apply route 4 (four) times daily -  before meals and at bedtime. 03/23/14   Alexa Sherral Hammers, MD  acetaminophen-codeine (TYLENOL #3) 300-30 MG per tablet Take 1-2 tablets by mouth every 4 (four) hours as needed for moderate pain. 10/06/14   Nischal Narendra, MD  amLODipine (NORVASC) 10 MG tablet Take 0.5 tablets (5 mg total) by mouth daily. Patient taking differently: Take 10 mg by mouth daily. Patient was advised to increase dose to 10mg  09/23/14   [name], MD  EASY COMFORT INSULIN SYRINGE 31G X 5/16" 1 ML MISC Use as directed daily for insulin injection. 10/05/14   Historical Provider, MD  feeding supplement, GLUCERNA SHAKE, (GLUCERNA SHAKE) LIQD Take 237 mLs by mouth 2 (two) times daily between meals. Patient not taking: Reported on 10/09/2014 09/16/14   Dellia Nims, MD  ferrous sulfate 325 (65 FE) MG tablet Take 1 tablet (325 mg total) by mouth daily with breakfast. 10/28/14   Ejiroghene  Arlyce Dice, MD  FLUoxetine (PROZAC) 10 MG capsule Take 1 capsule (10 mg total) by mouth daily. 09/23/14 09/23/15  [name], MD  folic acid (FOLVITE) 1 MG tablet Take 1 tablet (1 mg total) by mouth daily. Patient not taking: Reported on 10/09/2014 09/16/14   Dellia Nims, MD  gabapentin (NEURONTIN) 600 MG tablet Take 1 tablet (600 mg total) by mouth 3 (three) times daily. 06/04/14   [name], MD  glucose blood (ACCU-CHEK SMARTVIEW) test strip 1 each by Other route 4 (four) times daily -  before meals and at bedtime. Use as instructed 07/09/14   [name], MD  insulin aspart (NOVOLOG) 100 UNIT/ML injection Inject 5 Units into the skin 3 (three) times daily with meals. 09/16/14   Tasrif Ahmed, MD  insulin glargine (LANTUS) 100 UNIT/ML injection Inject 0.45 mLs (45 Units total) into the skin daily. Inject 25 units in the morning, and 20 units in the evening, 12 hours apart 10/09/14 10/09/15  [name], MD  lisinopril (PRINIVIL,ZESTRIL) 20 MG tablet Take 1 tablet (20 mg total) by mouth daily. 10/28/14   Ejiroghene Arlyce Dice, MD  metFORMIN (GLUCOPHAGE) 500 MG tablet Take 1 tablet (500 mg  total) by mouth daily with breakfast. 10/09/14 10/09/15  [name], MD  metoCLOPramide (REGLAN) 10 MG tablet TAKE 1 TABLET BY MOUTH FOUR TIMES DAILY **BEFORE MEALS AND AT BEDTIME** 11/16/14   [name], MD  nicotine (NICODERM CQ - DOSED IN MG/24 HOURS) 14 mg/24hr patch Place 1 patch onto the skin daily. Patient not taking: Reported on 10/09/2014 03/14/14   [name], MD  pantoprazole (PROTONIX) 40 MG tablet Take 1 tablet (40 mg total) by mouth 2 (two) times daily. 09/16/14   Tasrif Ahmed, MD  polyethylene glycol (MIRALAX / GLYCOLAX) packet Take 17 g by mouth daily as needed. 10/28/14   Ejiroghene Arlyce Dice, MD  sildenafil (REVATIO) 20 MG tablet Take 2-5 tablets before sexual activity. 08/25/14   [name], MD  sucralfate (CARAFATE) 1 GM/10ML suspension Take 10 mLs (1 g total) by mouth 4 (four) times daily -  with meals and at bedtime. 10/27/14   [name], MD   BP 155/91 mmHg  Pulse 85  Temp(Src) 97.7 F (36.5 C) (Oral)  Resp 18  Wt 175 lb (79.379 kg)  SpO2 98% Physical Exam  Constitutional: He is oriented to person, place, and time. He appears well-developed and well-nourished. No distress.  HENT:  Head: Normocephalic and atraumatic.  Mouth/Throat: Oropharynx is clear and moist.  Eyes: Pupils are equal, round, and reactive to light.  Neck: Normal range of motion. Neck supple.  Cardiovascular: Normal rate, regular rhythm and normal heart sounds.  Exam reveals no gallop and no friction rub.   No murmur heard. Pulmonary/Chest: Effort normal and breath sounds normal. No respiratory distress. He has no wheezes.  Abdominal: Soft. Bowel sounds are normal. He exhibits no distension. There is no tenderness.  Neurological: He is alert and oriented to person, place, and time. He exhibits normal muscle tone. Coordination normal.  Skin: Skin is warm and dry. Rash noted. No erythema.     Psychiatric: He has a normal mood and affect. His behavior is normal.  Nursing note and vitals  reviewed.   ED Course  Procedures (including critical care time) Labs Review Labs Reviewed  BASIC METABOLIC PANEL - Abnormal; Notable for the following:    Sodium 130 (*)    Chloride 99 (*)    Glucose, Bld 585 (*)    Creatinine,  Ser 1.70 (*)    Calcium 8.7 (*)    GFR calc non Af Amer 46 (*)    GFR calc Af Amer 53 (*)    All other components within normal limits  CBC - Abnormal; Notable for the following:    WBC 14.4 (*)    RBC 3.96 (*)    Hemoglobin 11.0 (*)    HCT 33.9 (*)    All other components within normal limits  CBG MONITORING, ED - Abnormal; Notable for the following:    Glucose-Capillary 512 (*)    All other components within normal limits  I-STAT VENOUS BLOOD GAS, ED - Abnormal; Notable for the following:    pH, Ven 7.391 (*)    pCO2, Ven 37.4 (*)    pO2, Ven 68.0 (*)    All other components within normal limits  CBG MONITORING, ED - Abnormal; Notable for the following:    Glucose-Capillary 293 (*)    All other components within normal limits    Imaging Review No results found. I have personally reviewed and evaluated these images and lab results as part of my medical decision-making.  Patient will be discharged home, told to return here as needed.  Patient agrees the plan and all questions were answered.  I did advise her to take Benadryl for any itching.  This is a very small hive-like rash noted to the left lateral buttocks and upper thigh.  Patient was observed due to his blood sugars and the fact that he received epinephrine.  Dalia Heading, PA-C 01/07/15 Iliff Yao, MD 01/07/15 [phone]

## 2015-01-05 NOTE — ED Notes (Signed)
Patient presents via EMS with what looks like bug bites on his torso, neck and upper legs.  EMS adm Epi 0.3mg , Zantac 50mg  IV, Solumedrol 125mg  and Benadryl 50mg .  BP 144/86, HR 90, 98%, Resp 18  Elevated CBG but stated it is usually high (uncontrolled)

## 2015-02-01 ENCOUNTER — Encounter: Payer: Self-pay | Admitting: Internal Medicine

## 2015-02-01 ENCOUNTER — Ambulatory Visit (INDEPENDENT_AMBULATORY_CARE_PROVIDER_SITE_OTHER): Payer: Medicaid Other | Admitting: Internal Medicine

## 2015-02-01 DIAGNOSIS — I10 Essential (primary) hypertension: Secondary | ICD-10-CM

## 2015-02-01 DIAGNOSIS — Z794 Long term (current) use of insulin: Secondary | ICD-10-CM | POA: Diagnosis not present

## 2015-02-01 DIAGNOSIS — Z23 Encounter for immunization: Secondary | ICD-10-CM | POA: Diagnosis not present

## 2015-02-01 DIAGNOSIS — Z7984 Long term (current) use of oral hypoglycemic drugs: Secondary | ICD-10-CM | POA: Diagnosis not present

## 2015-02-01 DIAGNOSIS — Z89512 Acquired absence of left leg below knee: Secondary | ICD-10-CM | POA: Diagnosis not present

## 2015-02-01 DIAGNOSIS — D509 Iron deficiency anemia, unspecified: Secondary | ICD-10-CM | POA: Diagnosis not present

## 2015-02-01 DIAGNOSIS — Z89511 Acquired absence of right leg below knee: Secondary | ICD-10-CM

## 2015-02-01 DIAGNOSIS — E1165 Type 2 diabetes mellitus with hyperglycemia: Secondary | ICD-10-CM

## 2015-02-01 DIAGNOSIS — IMO0002 Reserved for concepts with insufficient information to code with codable children: Secondary | ICD-10-CM

## 2015-02-01 DIAGNOSIS — E118 Type 2 diabetes mellitus with unspecified complications: Principal | ICD-10-CM

## 2015-02-01 DIAGNOSIS — K920 Hematemesis: Secondary | ICD-10-CM | POA: Diagnosis not present

## 2015-02-01 DIAGNOSIS — D649 Anemia, unspecified: Secondary | ICD-10-CM

## 2015-02-01 LAB — CBC
HCT: 34 % — ABNORMAL LOW (ref 39.0–52.0)
HEMOGLOBIN: 10.9 g/dL — AB (ref 13.0–17.0)
MCH: 27.5 pg (ref 26.0–34.0)
MCHC: 32.1 g/dL (ref 30.0–36.0)
MCV: 85.9 fL (ref 78.0–100.0)
Platelets: 371 10*3/uL (ref 150–400)
RBC: 3.96 MIL/uL — ABNORMAL LOW (ref 4.22–5.81)
RDW: 14.9 % (ref 11.5–15.5)
WBC: 7.3 10*3/uL (ref 4.0–10.5)

## 2015-02-01 LAB — BASIC METABOLIC PANEL
Anion gap: 10 (ref 5–15)
BUN: 9 mg/dL (ref 6–20)
CALCIUM: 9.4 mg/dL (ref 8.9–10.3)
CHLORIDE: 106 mmol/L (ref 101–111)
CO2: 24 mmol/L (ref 22–32)
Creatinine, Ser: 1.22 mg/dL (ref 0.61–1.24)
GFR calc Af Amer: >60 mL/min (ref >60)
GFR calc non Af Amer: >60 mL/min (ref >60)
Glucose, Bld: 130 mg/dL — ABNORMAL HIGH (ref 65–99)
Potassium: 3.6 mmol/L (ref 3.5–5.1)
SODIUM: 140 mmol/L (ref 135–145)

## 2015-02-01 LAB — GLUCOSE, CAPILLARY: GLUCOSE-CAPILLARY: 98 mg/dL (ref 65–99)

## 2015-02-01 LAB — POCT GLYCOSYLATED HEMOGLOBIN (HGB A1C): Hemoglobin A1C: 12.3

## 2015-02-01 MED ORDER — GABAPENTIN 600 MG PO TABS: 600.0000 mg | ORAL_TABLET | Freq: Three times a day (TID) | ORAL | Status: DC

## 2015-02-01 MED ORDER — DOCUSATE SODIUM 100 MG PO CAPS: 100.0000 mg | ORAL_CAPSULE | Freq: Every day | ORAL | Status: DC | PRN

## 2015-02-01 NOTE — Progress Notes (Signed)
Patient ID: [name], male   DOB: 02/21/1966, 49 y.o.   MRN: [id number]    Subjective:   Patient ID: [name] male   DOB: Jan 30, 1966 49 y.o.   MRN: [id number]  HPI: WesleyAleksei B [name] is a 49 y.o. man who is here for a follow up to discuss HTN, T2DM, and GERD.  Please see the problem list for the status of the patient's chronic medical problems.    Past Medical History  Diagnosis Date  . Onychomycosis   . Hypertension   . Diabetic peripheral neuropathy   . Diabetic foot ulcer     s/p Right first ray amputation, left transmetatarsal amputation with revision  . History of drug abuse     Cocaine and marijuana  . Exposure to trichomonas     treated empirically  . Cellulitis of left foot 03/2008    left 4th and 5th metatarsal area  . Hyperlipidemia   . Alcoholic pancreatitis 78/2956  . Ulcerative esophagitis 2130    severe, complicated with UGI bleed  . Mallory [name] tear April 2009  . History of prolonged Q-T interval on ECG   . 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  . 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 CKD (chronic kidney disease) stage 2, GFR 60-89 ml/min     BL SCr 1.3-1.4  . Depression   . Peripheral vascular disease   . Type 2 diabetes mellitus, uncontrolled, with renal complications   . GERD (gastroesophageal reflux disease)   . 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 Cellulitis and abscess of leg 01/31/2014  . Osteomyelitis of left leg 01/21/2014  . History of left below knee amputation 01/21/2014    Performed on    . Status post below knee amputation of right lower extremity  03/23/2014   Current Outpatient Prescriptions  Medication Sig Dispense Refill  . ACCU-CHEK FASTCLIX LANCETS MISC 1 each by Does not apply route 4 (four) times daily -  before meals and at bedtime. 102 each 11  . acetaminophen-codeine (TYLENOL #3) 300-30 MG per tablet Take 1-2 tablets by mouth every 4 (four) hours as needed for moderate pain. 60 tablet 0  . amLODipine (NORVASC) 10 MG tablet Take 0.5 tablets (5 mg total) by mouth daily. (Patient taking differently: Take 10 mg by mouth daily. Patient was advised to increase dose to 10mg ) 30 tablet 2  . EASY COMFORT INSULIN SYRINGE 31G X 5/16" 1 ML MISC Use as directed daily for insulin injection.  99  . feeding supplement, GLUCERNA SHAKE, (GLUCERNA SHAKE) LIQD Take 237 mLs by mouth 2 (two) times daily between meals. (Patient not taking: Reported on 10/09/2014) 60 Can 3  . ferrous sulfate 325 (65 FE) MG tablet Take 1 tablet (325 mg total) by mouth daily with breakfast. 90 tablet 3  . FLUoxetine (PROZAC) 10 MG capsule Take 1 capsule (10 mg total) by mouth daily. 30 capsule 6  . folic acid (FOLVITE) 1 MG tablet Take 1 tablet (1 mg total) by mouth daily. (Patient not taking: Reported on 10/09/2014) 30 tablet 3  .  gabapentin (NEURONTIN) 600 MG tablet Take 1 tablet (600 mg total) by mouth 3 (three) times daily. 90 tablet 6  . glucose blood (ACCU-CHEK SMARTVIEW) test strip 1 each by Other route 4 (four) times daily -  before meals and at bedtime. Use as instructed 100 each 12  . insulin aspart (NOVOLOG) 100 UNIT/ML injection Inject 5 Units into the skin 3 (three) times daily with meals. 10 mL 11  . insulin glargine (LANTUS) 100 UNIT/ML injection Inject 0.45 mLs (45 Units total) into the skin daily. Inject 25 units in the morning, and 20 units in the evening, 12 hours apart 10 mL 4  . lisinopril (PRINIVIL,ZESTRIL) 20 MG tablet Take 1 tablet (20 mg total) by mouth daily. 30 tablet 1  . metFORMIN (GLUCOPHAGE) 500 MG tablet Take 1 tablet (500 mg total) by mouth daily  with breakfast. 30 tablet 11  . metoCLOPramide (REGLAN) 10 MG tablet TAKE 1 TABLET BY MOUTH FOUR TIMES DAILY **BEFORE MEALS AND AT BEDTIME** 120 tablet 2  . nicotine (NICODERM CQ - DOSED IN MG/24 HOURS) 14 mg/24hr patch Place 1 patch onto the skin daily. (Patient not taking: Reported on 10/09/2014) 28 patch 0  . pantoprazole (PROTONIX) 40 MG tablet Take 1 tablet (40 mg total) by mouth 2 (two) times daily. 60 tablet 3  . polyethylene glycol (MIRALAX / GLYCOLAX) packet Take 17 g by mouth daily as needed. 14 each 3  . sildenafil (REVATIO) 20 MG tablet Take 2-5 tablets before sexual activity. 50 tablet 1  . sucralfate (CARAFATE) 1 GM/10ML suspension Take 10 mLs (1 g total) by mouth 4 (four) times daily -  with meals and at bedtime. 3600 mL 1   No current facility-administered medications for this visit.   Family History  Problem Relation Age of Onset  . Diabetes type II Mother   . Pancreatic cancer Father    Social History   Social History  . Marital Status: Married    Spouse Name: N/A  . Number of Children: N/A  . Years of Education: 12   Occupational History  . disabled    Social History Main Topics  . Smoking status: Current Every Day Smoker -- 2.00 packs/day for 34 years    Types: Cigarettes, Cigars  . Smokeless tobacco: Never Used     Comment: 1/2 PPD  . Alcohol Use: 0.0 oz/week    0 Standard drinks or equivalent per week     Comment: occ  . Drug Use: Yes    Special: Marijuana     Comment: NO LONGER USES-GRADUATING FROM [employer] AFTER  3-4 MONTHS                                                                                                                                       . Sexual Activity: Not on file   Other Topics Concern  . Not on file   Social History Narrative  Review of Systems: Review of Systems  Constitutional: Negative.  Negative for fever, chills, weight loss and malaise/fatigue.  HENT: Negative.   Eyes: Negative.   Respiratory: Negative.  Negative for  cough, hemoptysis, sputum production and shortness of breath.   Cardiovascular: Negative.  Negative for chest pain, palpitations, orthopnea, claudication and leg swelling.  Gastrointestinal: Negative for nausea, vomiting, abdominal pain and diarrhea.  Musculoskeletal: Negative.   Neurological: Negative.  Negative for dizziness and focal weakness.  Psychiatric/Behavioral: Negative.     Objective:  Physical Exam: Filed Vitals:   02/01/15 1339  Weight: 200 lb 8 oz (90.946 kg)   Physical Exam  Constitutional: He appears well-developed and well-nourished.  HENT:  Head: Normocephalic and atraumatic.  Eyes: Pupils are equal, round, and reactive to light.  Neck: Normal range of motion. Neck supple.  Cardiovascular: Normal rate, regular rhythm and normal heart sounds.   Pulmonary/Chest: Effort normal and breath sounds normal. No respiratory distress. He has no wheezes.  Abdominal: Soft. Bowel sounds are normal. He exhibits no distension. There is no tenderness.  Musculoskeletal: Normal range of motion.  Bilateral knee amputation- checked and the wound was clean dry intact  Skin: Skin is warm and dry. No rash noted. No erythema.  Psychiatric: He has a normal mood and affect.     Assessment & Plan:   Please see problem based charting for assessment and plan

## 2015-02-01 NOTE — Patient Instructions (Signed)
Thank you for your visit today Please take 2 pills of metformin (total 1000 mg)- take either both pills in the morning, or one in the morning plus one at night. Please continue 25 units lantus in AM and then 20 units of Lantus in evening. Please decrease the reglan to twice a day, you do not have to use it if you don't need it. Please take the colace for constipation.  Please BRING THE METER, and follow up in 4 weeks or so.

## 2015-02-02 NOTE — Assessment & Plan Note (Signed)
Checked both BKA- and it was c/d/i- no wound  Prescribed the liners, etc per patient request.

## 2015-02-02 NOTE — Assessment & Plan Note (Signed)
Pt has not had any n/v since being d/c He was prescribed Reglan 4 times a day which he is taking currently on the discharge to alleviate his symptoms. Pt only eats one meal a day, sometimes none.  Plan- decreased the reglan to twice a day as needed , and asked the patient to stop it if he does not need it.

## 2015-02-02 NOTE — Assessment & Plan Note (Signed)
BP Readings from Last 3 Encounters:  02/01/15 146/98  01/05/15 155/90  10/28/14 159/89   Pt's BP is better, though not at goal. He has been taking his amlodopine. He is also now taking lisinopril 20 mg. Patient was not sure how long ago he restarted the lisinopril and said that he found a bottle in his home and so he started taking it as the name had sounded familiar. I was going to start him on lisinopril anyway as his Cr is stable and back to its baseline- it is 1.22  Plan- continue amlodipine and lisinopril. If his BP is elevated at next visit, then may increase the amlodipine or add another agent.  RTC in 4 weeks

## 2015-02-02 NOTE — Assessment & Plan Note (Signed)
Checked HgB today and it was stable at 10.9. Patient was admitted recently for hematemesis with a drop in HgB. He has not experienced any other bouts. He is compliant with his ferrous sulfate.

## 2015-02-02 NOTE — Assessment & Plan Note (Signed)
Lab Results  Component Value Date   HGBA1C 12.3 02/01/2015   HGBA1C 13.5* 09/13/2014   HGBA1C 9.6 05/26/2014    His diabetes is very poorly controlled, though his A1c has improved by a point and half since the initiation of metformin at the last visit. He did not bring the meter. He says the lowest number was a 90 and highest was a 600. CBG in clinic was a 98.   This patient is a very high risk for getting hypoglycemia, because he eats irregular meals both in the frequency and quantity. ANd this is because of his home situation and finances as he is not able to afford the groceries. THis is the reason I had advised him last time to not take mealtime insulin TID as he was only eating one meal a day and the quantity of that was not steady. The patient does not know how to titrate the novolog with meals and does not understand that he should not take the novolog when he does not eat, despite explaining multiple times.    Plan Continue 25 units Lantus in AM, and 20 units of Lantus at night Increased metformin to 1000 once a day (either split in half or all at AM). The goal is to increase the metformin to 1000 BID. His creatinine is back to the baseline at 1.22 Eventually would like him to be on a second oral agent less likely to cause hypoglycemia- like Januvia, or Invokana- He has medicaid so coverage should not be a problem. I would be very hesitant to add glipizide or other sulfonylurea in this patient.

## 2015-02-03 NOTE — Progress Notes (Signed)
Internal Medicine Clinic Attending  I saw and evaluated the patient.  I personally confirmed the key portions of the history and exam documented by Dr. Saraiya and I reviewed pertinent patient test results.  The assessment, diagnosis, and plan were formulated together and I agree with the documentation in the resident's note.  

## 2015-02-09 ENCOUNTER — Other Ambulatory Visit: Payer: Self-pay

## 2015-02-09 ENCOUNTER — Encounter: Payer: Self-pay | Admitting: Licensed Clinical Social Worker

## 2015-02-23 ENCOUNTER — Ambulatory Visit: Payer: Medicaid Other | Admitting: Internal Medicine

## 2015-02-24 ENCOUNTER — Other Ambulatory Visit: Payer: Self-pay

## 2015-03-11 ENCOUNTER — Inpatient Hospital Stay (HOSPITAL_COMMUNITY)
Admission: EM | Admit: 2015-03-11 | Discharge: 2015-03-14 | DRG: 639 | Disposition: A | Discharge status: Home / Self Care | Payer: Medicaid Other | Attending: Internal Medicine | Admitting: Internal Medicine

## 2015-03-11 DIAGNOSIS — K219 Gastro-esophageal reflux disease without esophagitis: Secondary | ICD-10-CM | POA: Diagnosis present

## 2015-03-11 DIAGNOSIS — E785 Hyperlipidemia, unspecified: Secondary | ICD-10-CM | POA: Diagnosis present

## 2015-03-11 DIAGNOSIS — I10 Essential (primary) hypertension: Secondary | ICD-10-CM

## 2015-03-11 DIAGNOSIS — Z89511 Acquired absence of right leg below knee: Secondary | ICD-10-CM

## 2015-03-11 DIAGNOSIS — E1310 Other specified diabetes mellitus with ketoacidosis without coma: Principal | ICD-10-CM

## 2015-03-11 DIAGNOSIS — I739 Peripheral vascular disease, unspecified: Secondary | ICD-10-CM | POA: Diagnosis present

## 2015-03-11 DIAGNOSIS — F17210 Nicotine dependence, cigarettes, uncomplicated: Secondary | ICD-10-CM | POA: Diagnosis present

## 2015-03-11 DIAGNOSIS — R1110 Vomiting, unspecified: Secondary | ICD-10-CM | POA: Diagnosis present

## 2015-03-11 DIAGNOSIS — I129 Hypertensive chronic kidney disease with stage 1 through stage 4 chronic kidney disease, or unspecified chronic kidney disease: Secondary | ICD-10-CM | POA: Diagnosis present

## 2015-03-11 DIAGNOSIS — F329 Major depressive disorder, single episode, unspecified: Secondary | ICD-10-CM | POA: Diagnosis present

## 2015-03-11 DIAGNOSIS — Z79899 Other long term (current) drug therapy: Secondary | ICD-10-CM

## 2015-03-11 DIAGNOSIS — F17290 Nicotine dependence, other tobacco product, uncomplicated: Secondary | ICD-10-CM | POA: Diagnosis present

## 2015-03-11 DIAGNOSIS — K2970 Gastritis, unspecified, without bleeding: Secondary | ICD-10-CM

## 2015-03-11 DIAGNOSIS — E1142 Type 2 diabetes mellitus with diabetic polyneuropathy: Secondary | ICD-10-CM | POA: Diagnosis present

## 2015-03-11 DIAGNOSIS — E1110 Type 2 diabetes mellitus with ketoacidosis without coma: Secondary | ICD-10-CM | POA: Insufficient documentation

## 2015-03-11 DIAGNOSIS — E1165 Type 2 diabetes mellitus with hyperglycemia: Secondary | ICD-10-CM | POA: Diagnosis present

## 2015-03-11 DIAGNOSIS — Z89512 Acquired absence of left leg below knee: Secondary | ICD-10-CM

## 2015-03-11 DIAGNOSIS — Z9114 Patient's other noncompliance with medication regimen: Secondary | ICD-10-CM

## 2015-03-11 DIAGNOSIS — N183 Chronic kidney disease, stage 3 (moderate): Secondary | ICD-10-CM | POA: Diagnosis present

## 2015-03-11 DIAGNOSIS — Z794 Long term (current) use of insulin: Secondary | ICD-10-CM

## 2015-03-11 NOTE — ED Notes (Signed)
Bed: WA07 Expected date:  Expected time:  Means of arrival:  Comments: EMS 49 yo M N/V

## 2015-03-11 NOTE — ED Notes (Signed)
Onset of emesis n/v since 8 am today CBG per EMS 436 HR 110 resp 16 BP 190/118 manual per EMS

## 2015-03-12 ENCOUNTER — Encounter (HOSPITAL_COMMUNITY): Payer: Self-pay

## 2015-03-12 DIAGNOSIS — E1165 Type 2 diabetes mellitus with hyperglycemia: Secondary | ICD-10-CM | POA: Diagnosis present

## 2015-03-12 DIAGNOSIS — I10 Essential (primary) hypertension: Secondary | ICD-10-CM | POA: Diagnosis not present

## 2015-03-12 DIAGNOSIS — Z794 Long term (current) use of insulin: Secondary | ICD-10-CM

## 2015-03-12 DIAGNOSIS — E1310 Other specified diabetes mellitus with ketoacidosis without coma: Secondary | ICD-10-CM | POA: Diagnosis not present

## 2015-03-12 DIAGNOSIS — K2970 Gastritis, unspecified, without bleeding: Secondary | ICD-10-CM | POA: Diagnosis not present

## 2015-03-12 DIAGNOSIS — R1110 Vomiting, unspecified: Secondary | ICD-10-CM | POA: Diagnosis present

## 2015-03-12 DIAGNOSIS — E1110 Type 2 diabetes mellitus with ketoacidosis without coma: Secondary | ICD-10-CM | POA: Diagnosis present

## 2015-03-12 LAB — CBG MONITORING, ED
GLUCOSE-CAPILLARY: 109 mg/dL — AB (ref 65–99)
GLUCOSE-CAPILLARY: 116 mg/dL — AB (ref 65–99)
GLUCOSE-CAPILLARY: 143 mg/dL — AB (ref 65–99)
GLUCOSE-CAPILLARY: 216 mg/dL — AB (ref 65–99)
GLUCOSE-CAPILLARY: 251 mg/dL — AB (ref 65–99)
GLUCOSE-CAPILLARY: 293 mg/dL — AB (ref 65–99)
GLUCOSE-CAPILLARY: 481 mg/dL — AB (ref 65–99)
Glucose-Capillary: 343 mg/dL — ABNORMAL HIGH (ref 65–99)
Glucose-Capillary: 384 mg/dL — ABNORMAL HIGH (ref 65–99)
Glucose-Capillary: 178 mg/dL — ABNORMAL HIGH (ref 65–99)
Glucose-Capillary: 125 mg/dL — ABNORMAL HIGH (ref 65–99)

## 2015-03-12 LAB — URINALYSIS, ROUTINE W REFLEX MICROSCOPIC
BILIRUBIN URINE: NEGATIVE
Glucose, UA: >1000 mg/dL — AB
Ketones, ur: 15 mg/dL — AB
Leukocytes, UA: NEGATIVE
NITRITE: NEGATIVE
Protein, ur: 30 mg/dL — AB
SPECIFIC GRAVITY, URINE: 1.028 (ref 1.005–1.030)
pH: 5.5 (ref 5.0–8.0)

## 2015-03-12 LAB — BASIC METABOLIC PANEL
ANION GAP: 12 (ref 5–15)
Anion gap: 9 (ref 5–15)
BUN: 25 mg/dL — AB (ref 6–20)
BUN: 16 mg/dL (ref 6–20)
CALCIUM: 8.8 mg/dL — AB (ref 8.9–10.3)
CHLORIDE: 103 mmol/L (ref 101–111)
CO2: 25 mmol/L (ref 22–32)
CO2: 26 mmol/L (ref 22–32)
CREATININE: 1.33 mg/dL — AB (ref 0.61–1.24)
Calcium: 8.6 mg/dL — ABNORMAL LOW (ref 8.9–10.3)
Chloride: 101 mmol/L (ref 101–111)
Creatinine, Ser: 1.39 mg/dL — ABNORMAL HIGH (ref 0.61–1.24)
GFR calc Af Amer: >60 mL/min (ref >60)
GFR calc Af Amer: >60 mL/min (ref >60)
GFR calc non Af Amer: >60 mL/min (ref >60)
GFR, EST NON AFRICAN AMERICAN: 58 mL/min — AB (ref >60)
Glucose, Bld: 259 mg/dL — ABNORMAL HIGH (ref 65–99)
Glucose, Bld: 166 mg/dL — ABNORMAL HIGH (ref 65–99)
POTASSIUM: 3.6 mmol/L (ref 3.5–5.1)
Potassium: 3.6 mmol/L (ref 3.5–5.1)
SODIUM: 138 mmol/L (ref 135–145)
Sodium: 138 mmol/L (ref 135–145)

## 2015-03-12 LAB — COMPREHENSIVE METABOLIC PANEL
ALT: 17 U/L (ref 17–63)
AST: 23 U/L (ref 15–41)
Albumin: 4.6 g/dL (ref 3.5–5.0)
Alkaline Phosphatase: 136 U/L — ABNORMAL HIGH (ref 38–126)
Anion gap: 19 — ABNORMAL HIGH (ref 5–15)
BUN: 28 mg/dL — ABNORMAL HIGH (ref 6–20)
CHLORIDE: 89 mmol/L — AB (ref 101–111)
CO2: 26 mmol/L (ref 22–32)
CREATININE: 1.67 mg/dL — AB (ref 0.61–1.24)
Calcium: 9.7 mg/dL (ref 8.9–10.3)
GFR calc non Af Amer: 47 mL/min — ABNORMAL LOW (ref >60)
GFR, EST AFRICAN AMERICAN: 54 mL/min — AB (ref >60)
Glucose, Bld: 509 mg/dL — ABNORMAL HIGH (ref 65–99)
Potassium: 4.6 mmol/L (ref 3.5–5.1)
Sodium: 134 mmol/L — ABNORMAL LOW (ref 135–145)
Total Bilirubin: 1.3 mg/dL — ABNORMAL HIGH (ref 0.3–1.2)
Total Protein: 8.8 g/dL — ABNORMAL HIGH (ref 6.5–8.1)

## 2015-03-12 LAB — GLUCOSE, CAPILLARY
Glucose-Capillary: 124 mg/dL — ABNORMAL HIGH (ref 65–99)
Glucose-Capillary: 146 mg/dL — ABNORMAL HIGH (ref 65–99)
Glucose-Capillary: 150 mg/dL — ABNORMAL HIGH (ref 65–99)
Glucose-Capillary: 181 mg/dL — ABNORMAL HIGH (ref 65–99)

## 2015-03-12 LAB — CBC WITH DIFFERENTIAL/PLATELET
BASOS ABS: 0 10*3/uL (ref 0.0–0.1)
Basophils Relative: 0 %
EOS ABS: 0 10*3/uL (ref 0.0–0.7)
EOS PCT: 0 %
HCT: 41.3 % (ref 39.0–52.0)
Hemoglobin: 14.2 g/dL (ref 13.0–17.0)
Lymphocytes Relative: 7 %
Lymphs Abs: 1.2 10*3/uL (ref 0.7–4.0)
MCH: 29.9 pg (ref 26.0–34.0)
MCHC: 34.4 g/dL (ref 30.0–36.0)
MCV: 86.9 fL (ref 78.0–100.0)
Monocytes Absolute: 0.7 10*3/uL (ref 0.1–1.0)
Monocytes Relative: 4 %
Neutro Abs: 15.8 10*3/uL — ABNORMAL HIGH (ref 1.7–7.7)
Neutrophils Relative %: 89 %
PLATELETS: 474 10*3/uL — AB (ref 150–400)
RBC: 4.75 MIL/uL (ref 4.22–5.81)
RDW: 14.4 % (ref 11.5–15.5)
WBC: 17.6 10*3/uL — AB (ref 4.0–10.5)

## 2015-03-12 LAB — URINE MICROSCOPIC-ADD ON
Bacteria, UA: NONE SEEN
SQUAMOUS EPITHELIAL / LPF: NONE SEEN
WBC, UA: NONE SEEN WBC/hpf (ref 0–5)

## 2015-03-12 LAB — LIPASE, BLOOD: Lipase: 31 U/L (ref 11–51)

## 2015-03-12 LAB — PHOSPHORUS: PHOSPHORUS: 1.7 mg/dL — AB (ref 2.5–4.6)

## 2015-03-12 LAB — ETHANOL: Alcohol, Ethyl (B): <5 mg/dL (ref <5)

## 2015-03-12 LAB — MAGNESIUM: MAGNESIUM: 2.1 mg/dL (ref 1.7–2.4)

## 2015-03-12 MED ORDER — ONDANSETRON HCL 4 MG PO TABS: 4.0000 mg | ORAL_TABLET | Freq: Four times a day (QID) | ORAL | Status: DC | PRN

## 2015-03-12 MED ORDER — PANTOPRAZOLE SODIUM 40 MG IV SOLR
40.0000 mg | Freq: Once | INTRAVENOUS | Status: AC
  Administered 2015-03-12: 40 mg via INTRAVENOUS
  Filled 2015-03-12: qty 40

## 2015-03-12 MED ORDER — PANTOPRAZOLE SODIUM 40 MG PO TBEC
40.0000 mg | DELAYED_RELEASE_TABLET | Freq: Two times a day (BID) | ORAL | Status: DC
  Administered 2015-03-12: 40 mg via ORAL
  Filled 2015-03-12: qty 1

## 2015-03-12 MED ORDER — HYDROCODONE-ACETAMINOPHEN 5-325 MG PO TABS
1.0000 | ORAL_TABLET | ORAL | Status: DC | PRN
  Administered 2015-03-12 – 2015-03-14 (×7): 1 via ORAL
  Filled 2015-03-12 (×7): qty 1

## 2015-03-12 MED ORDER — DIPHENHYDRAMINE HCL 50 MG/ML IJ SOLN
25.0000 mg | Freq: Once | INTRAMUSCULAR | Status: AC
  Administered 2015-03-12: 25 mg via INTRAVENOUS
  Filled 2015-03-12: qty 1

## 2015-03-12 MED ORDER — INSULIN GLARGINE 100 UNIT/ML ~~LOC~~ SOLN
22.0000 [IU] | SUBCUTANEOUS | Status: DC
  Administered 2015-03-12: 22 [IU] via SUBCUTANEOUS
  Filled 2015-03-12 (×3): qty 0.22

## 2015-03-12 MED ORDER — AMLODIPINE BESYLATE 10 MG PO TABS
10.0000 mg | ORAL_TABLET | Freq: Every day | ORAL | Status: DC
  Administered 2015-03-12 – 2015-03-14 (×3): 10 mg via ORAL
  Filled 2015-03-12 (×3): qty 1

## 2015-03-12 MED ORDER — SODIUM CHLORIDE 0.9 % IV SOLN: INTRAVENOUS | Status: DC

## 2015-03-12 MED ORDER — ALUM & MAG HYDROXIDE-SIMETH 200-200-20 MG/5ML PO SUSP
30.0000 mL | Freq: Four times a day (QID) | ORAL | Status: DC | PRN
  Administered 2015-03-12 – 2015-03-13 (×2): 30 mL via ORAL
  Filled 2015-03-12 (×2): qty 30

## 2015-03-12 MED ORDER — ACETAMINOPHEN 650 MG RE SUPP: 650.0000 mg | Freq: Four times a day (QID) | RECTAL | Status: DC | PRN

## 2015-03-12 MED ORDER — GI COCKTAIL ~~LOC~~
30.0000 mL | Freq: Once | ORAL | Status: AC
  Administered 2015-03-12: 30 mL via ORAL
  Filled 2015-03-12: qty 30

## 2015-03-12 MED ORDER — SODIUM CHLORIDE 0.9 % IJ SOLN
3.0000 mL | Freq: Two times a day (BID) | INTRAMUSCULAR | Status: DC
  Administered 2015-03-14: 3 mL via INTRAVENOUS

## 2015-03-12 MED ORDER — DEXTROSE-NACL 5-0.45 % IV SOLN
INTRAVENOUS | Status: DC
  Administered 2015-03-12 – 2015-03-14 (×5): via INTRAVENOUS

## 2015-03-12 MED ORDER — ONDANSETRON HCL 4 MG/2ML IJ SOLN
4.0000 mg | Freq: Once | INTRAMUSCULAR | Status: AC
  Administered 2015-03-12: 4 mg via INTRAVENOUS
  Filled 2015-03-12: qty 2

## 2015-03-12 MED ORDER — DIPHENHYDRAMINE HCL 25 MG PO CAPS: 25.0000 mg | ORAL_CAPSULE | Freq: Every evening | ORAL | Status: DC | PRN

## 2015-03-12 MED ORDER — METOCLOPRAMIDE HCL 5 MG/ML IJ SOLN
5.0000 mg | Freq: Once | INTRAMUSCULAR | Status: AC
  Administered 2015-03-12: 5 mg via INTRAVENOUS
  Filled 2015-03-12: qty 2

## 2015-03-12 MED ORDER — ENOXAPARIN SODIUM 40 MG/0.4ML ~~LOC~~ SOLN
40.0000 mg | SUBCUTANEOUS | Status: DC
  Administered 2015-03-12 – 2015-03-14 (×3): 40 mg via SUBCUTANEOUS
  Filled 2015-03-12 (×3): qty 0.4

## 2015-03-12 MED ORDER — SUCRALFATE 1 GM/10ML PO SUSP
1.0000 g | Freq: Three times a day (TID) | ORAL | Status: DC
  Administered 2015-03-12 – 2015-03-14 (×9): 1 g via ORAL
  Filled 2015-03-12 (×9): qty 10

## 2015-03-12 MED ORDER — MORPHINE SULFATE (PF) 4 MG/ML IV SOLN
4.0000 mg | Freq: Once | INTRAVENOUS | Status: AC
  Administered 2015-03-12: 4 mg via INTRAVENOUS
  Filled 2015-03-12: qty 1

## 2015-03-12 MED ORDER — INSULIN REGULAR HUMAN 100 UNIT/ML IJ SOLN
INTRAMUSCULAR | Status: AC
  Administered 2015-03-12: 05:00:00 via INTRAVENOUS
  Filled 2015-03-12: qty 2.5

## 2015-03-12 MED ORDER — FOLIC ACID 1 MG PO TABS
1.0000 mg | ORAL_TABLET | Freq: Every day | ORAL | Status: DC
  Administered 2015-03-12 – 2015-03-14 (×3): 1 mg via ORAL
  Filled 2015-03-12 (×3): qty 1

## 2015-03-12 MED ORDER — PANTOPRAZOLE SODIUM 40 MG IV SOLR
40.0000 mg | Freq: Two times a day (BID) | INTRAVENOUS | Status: DC
  Administered 2015-03-12 – 2015-03-13 (×2): 40 mg via INTRAVENOUS
  Filled 2015-03-12 (×2): qty 40

## 2015-03-12 MED ORDER — INSULIN ASPART 100 UNIT/ML ~~LOC~~ SOLN
0.0000 [IU] | Freq: Three times a day (TID) | SUBCUTANEOUS | Status: DC
  Administered 2015-03-12: 2 [IU] via SUBCUTANEOUS
  Administered 2015-03-13: 11 [IU] via SUBCUTANEOUS
  Administered 2015-03-13 (×2): 5 [IU] via SUBCUTANEOUS
  Administered 2015-03-14 (×3): 8 [IU] via SUBCUTANEOUS

## 2015-03-12 MED ORDER — ONDANSETRON HCL 4 MG/2ML IJ SOLN
4.0000 mg | Freq: Four times a day (QID) | INTRAMUSCULAR | Status: DC | PRN
Start: 2015-03-12 — End: 2015-03-12
  Administered 2015-03-12: 4 mg via INTRAVENOUS
  Filled 2015-03-12: qty 2

## 2015-03-12 MED ORDER — GLUCERNA SHAKE PO LIQD
237.0000 mL | Freq: Two times a day (BID) | ORAL | Status: DC
  Administered 2015-03-13 – 2015-03-14 (×4): 237 mL via ORAL

## 2015-03-12 MED ORDER — DIPHENHYDRAMINE HCL 50 MG/ML IJ SOLN
INTRAMUSCULAR | Status: AC
  Administered 2015-03-12: 25 mg via INTRAVENOUS
  Filled 2015-03-12: qty 1

## 2015-03-12 MED ORDER — INSULIN ASPART 100 UNIT/ML ~~LOC~~ SOLN
10.0000 [IU] | Freq: Once | SUBCUTANEOUS | Status: AC
  Administered 2015-03-12: 10 [IU] via INTRAVENOUS
  Filled 2015-03-12: qty 1

## 2015-03-12 MED ORDER — DOXYLAMINE SUCCINATE (SLEEP) 25 MG PO TABS
25.0000 mg | ORAL_TABLET | Freq: Every evening | ORAL | Status: DC | PRN
  Filled 2015-03-12: qty 1

## 2015-03-12 MED ORDER — FERROUS SULFATE 325 (65 FE) MG PO TABS
325.0000 mg | ORAL_TABLET | Freq: Every day | ORAL | Status: DC
  Administered 2015-03-13 – 2015-03-14 (×2): 325 mg via ORAL
  Filled 2015-03-12 (×2): qty 1

## 2015-03-12 MED ORDER — FLUOXETINE HCL 10 MG PO CAPS
10.0000 mg | ORAL_CAPSULE | Freq: Every day | ORAL | Status: DC
  Administered 2015-03-12 – 2015-03-14 (×3): 10 mg via ORAL
  Filled 2015-03-12 (×3): qty 1

## 2015-03-12 MED ORDER — PROCHLORPERAZINE EDISYLATE 5 MG/ML IJ SOLN
10.0000 mg | Freq: Four times a day (QID) | INTRAMUSCULAR | Status: DC | PRN
  Administered 2015-03-12 – 2015-03-13 (×3): 10 mg via INTRAVENOUS
  Filled 2015-03-12 (×4): qty 2

## 2015-03-12 MED ORDER — ACETAMINOPHEN 325 MG PO TABS: 650.0000 mg | ORAL_TABLET | Freq: Four times a day (QID) | ORAL | Status: DC | PRN

## 2015-03-12 MED ORDER — SODIUM CHLORIDE 0.9 % IV BOLUS (SEPSIS)
1000.0000 mL | Freq: Once | INTRAVENOUS | Status: AC
  Administered 2015-03-12: 1000 mL via INTRAVENOUS

## 2015-03-12 MED ORDER — GABAPENTIN 600 MG PO TABS
600.0000 mg | ORAL_TABLET | Freq: Three times a day (TID) | ORAL | Status: DC
  Administered 2015-03-12 – 2015-03-14 (×6): 600 mg via ORAL
  Filled 2015-03-12 (×6): qty 1

## 2015-03-12 NOTE — ED Notes (Signed)
Pt sleeping no further c/o pain insulin gtt infusing per order 1 liter D%/45 at bedside last CBG 293 will start new fluid with CBG < 250 per order

## 2015-03-12 NOTE — ED Notes (Signed)
Patient requesting pain medicine, but there is currently none ordered.

## 2015-03-12 NOTE — ED Notes (Signed)
Patient resting comfortably.  Asked for and received ice chips.  Informed patient we are waiting on a bed assignment.

## 2015-03-12 NOTE — ED Provider Notes (Signed)
Dr. Dina Rich had arranged admission for the patient at [facility] with teaching service. After Dr. Dina Rich had left the resident call back advising that they were unsure that the patient needed to be admitted as there was improvement in the chemistry panel. I reassessed the patient and he continued to complain of nausea and abdominal pain reporting that he still felt unable to take fluids and food by mouth. This was then readdressed and the patient's bed status was changed to MedSurg as he has shown improvement but still is not taking in oral medication or hydration with a history of multiple severe medical comorbidities.  Charlesetta Shanks, MD 03/12/15 [phone]

## 2015-03-12 NOTE — ED Notes (Signed)
[facility] called  

## 2015-03-12 NOTE — Progress Notes (Signed)
Patient arrived to floor via [facility] transport.  Oriented to room. Patient c/o pain.  MD notified. Awaiting response.

## 2015-03-12 NOTE — Progress Notes (Signed)
Medicaid [place] Access Covered Patient [place]: [place]: [id number] [place], [place] [address] [email or web address] USE THIS SITE TO ASSIST WITH UNDERSTANDING YOUR COVERAGE, RENEW APPLICATION, As needed As a Medicaid client you MUST contact DSS/SSI each time you change address, move to another [place] or another state to keep your address updated

## 2015-03-12 NOTE — ED Provider Notes (Signed)
CSN: [id number]     Arrival date & time 03/11/15  2323 History  By signing my name below, I, [name], attest that this documentation has been prepared under the direction and in the presence of Wesley Hacker, MD. Electronically Signed: Helane [name], ED Scribe. 03/12/2015. 12:27 AM.    Chief Complaint  Patient presents with  . Emesis   The history is provided by the patient. No language interpreter was used.   HPI Comments: [name] is a 49 y.o. male smoker at 2 ppd with a PMHx of HTN, DM, and CKD who presents to the Emergency Department complaining of emesis onset 18 hours ago. Pt notes he drank heavily on an empty stomach last night. He reports associated sharp abdominal pain (rated as a 12/10), nausea, and diarrhea. He states he believes this is due to his PMHx of GERD. Pt denies CP and SOB.  Denies recent fevers. CBG 436 in route.  Past Medical History  Diagnosis Date  . Onychomycosis   . Hypertension   . Diabetic peripheral neuropathy ([place])   . Diabetic foot ulcer ([place])     s/p Right first ray amputation, left transmetatarsal amputation with revision  . History of drug abuse     Cocaine and marijuana  . Exposure to trichomonas     treated empirically  . Cellulitis of left foot 03/2008    left 4th and 5th metatarsal area  . Hyperlipidemia   . 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 Mallory [name] tear April 2009  . History of prolonged Q-T interval on ECG   . 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  . 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 CKD (chronic kidney disease) stage 2, GFR 60-89 ml/min     BL SCr 1.3-1.4  . Depression   . Peripheral vascular disease ([place])   . Type 2 diabetes mellitus, uncontrolled, with renal complications ([place])   . GERD  (gastroesophageal reflux disease)   . Hx of right BKA ([name]) 09/13/2012     Due to severe wound infection with sepsis  . 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 Cellulitis and abscess of leg 01/31/2014  . Osteomyelitis of left leg ([place]) 01/21/2014  . History of left below knee amputation ([place]) 01/21/2014    Performed on    . Status post below knee amputation of right lower extremity ([place]) 03/23/2014   Past Surgical History  Procedure Laterality Date  . Toe amputation  06/2011    left; 4th toe  . Nephrectomy      L side  . I&d extremity  08/24/2011    Procedure: IRRIGATION AND DEBRIDEMENT EXTREMITY;  Surgeon: Meredith Pel, MD;  Location: [facility];  Service: Orthopedics;  Laterality: Left;  . Amputation  08/24/2011    Procedure: AMPUTATION RAY;  Surgeon: Meredith Pel, MD;  Location: [facility];  Service: Orthopedics;  Laterality: Left;  left fourth toe Ray Resection  . Amputation  12/05/2011    Procedure: AMPUTATION RAY;  Surgeon: Meredith Pel, MD;  Location: [facility];  Service: Orthopedics;  Laterality: Left;  . I&d extremity  02/18/2012    Procedure: IRRIGATION AND DEBRIDEMENT EXTREMITY;  Surgeon: Mcarthur Rossetti, MD;  Location: [facility];  Service: Orthopedics;  Laterality: Left;  . Amputation  02/18/2012    Procedure: AMPUTATION DIGIT;  Surgeon: Mcarthur Rossetti, MD;  Location: [facility];  Service: Orthopedics;  Laterality: Left;  revison transmetatarsal  . I&d extremity Left 08/28/2012    Procedure: IRRIGATION AND DEBRIDEMENT EXTREMITY;  Surgeon: Linna Hoff, MD;  Location: [facility];  Service: Orthopedics;  Laterality: Left;  CYSTO TUBING/IRRIGATION, LEAD HAND.  . I&d extremity Left 08/30/2012    Procedure: IRRIGATION AND DEBRIDEMENT EXTREMITY;  Surgeon: Linna Hoff, MD;  Location: [facility];  Service: Orthopedics;   Laterality: Left;  . Amputation Right 09/01/2012    Procedure: FOOT 1ST RAY AMPUTATION;  Surgeon: Newt Minion, MD;  Location: [facility];  Service: Orthopedics;  Laterality: Right;  . Amputation Left 09/01/2012    Procedure: FOOT REVISION TRANSMETATARSAL AMPUTATION ;  Surgeon: Newt Minion, MD;  Location: [facility];  Service: Orthopedics;  Laterality: Left;  . Amputation Right 09/13/2012    Procedure: AMPUTATION BELOW KNEE;  Surgeon: Newt Minion, MD;  Location: [facility];  Service: Orthopedics;  Laterality: Right;  Right Below Knee Amputation  . Amputation Left 12/12/2013    Procedure: Left Below Knee Amputation;  Surgeon: Newt Minion, MD;  Location: [facility];  Service: Orthopedics;  Laterality: Left;  . Stump revision Left 01/23/2014    Procedure: STUMP REVISION;  Surgeon: Newt Minion, MD;  Location: [facility];  Service: Orthopedics;  Laterality: Left;  . Esophagogastroduodenoscopy (egd) with propofol N/A 09/16/2014    Procedure: ESOPHAGOGASTRODUODENOSCOPY (EGD) WITH PROPOFOL;  Surgeon: Wonda Horner, MD;  Location: [facility] ENDOSCOPY;  Service: Endoscopy;  Laterality: N/A;   Family History  Problem Relation Age of Onset  . Diabetes type II Mother   . Pancreatic cancer Father    Social History  Substance Use Topics  . Smoking status: Current Every Day Smoker -- 2.00 packs/day for 34 years    Types: Cigarettes, Cigars  . Smokeless tobacco: Never Used     Comment: 1/2 PPD  . Alcohol Use: 0.0 oz/week    0 Standard drinks or equivalent per week     Comment: occ    Review of Systems  Constitutional: Negative for fever.  Respiratory: Negative for shortness of breath.   Cardiovascular: Negative for chest pain.  Gastrointestinal: Positive for nausea, vomiting, abdominal pain and diarrhea.  Genitourinary: Negative for dysuria.  All other systems reviewed and are negative.   Allergies  Review of patient's allergies indicates no known allergies.  Home Medications   Prior to Admission medications    Medication Sig Start Date End Date Taking? Authorizing Provider  amLODipine (NORVASC) 10 MG tablet Take 0.5 tablets (5 mg total) by mouth daily. Patient taking differently: Take 10 mg by mouth daily. Patient was advised to increase dose to 10mg  09/23/14  Yes [name], MD  docusate sodium (COLACE) 100 MG capsule Take 1 capsule (100 mg total) by mouth daily as needed. 02/01/15 02/01/16 Yes [name], MD  feeding supplement, GLUCERNA SHAKE, (GLUCERNA SHAKE) LIQD Take 237 mLs by mouth 2 (two) times daily between meals. 09/16/14  Yes Tasrif Ahmed, MD  ferrous sulfate 325 (65 FE) MG tablet Take 1 tablet (325 mg total) by mouth daily with breakfast. 10/28/14  Yes Ejiroghene E Emokpae, MD  FLUoxetine (PROZAC) 10 MG capsule Take 1 capsule (10 mg total) by mouth daily. 09/23/14 09/23/15 Yes  [name], MD  folic acid (FOLVITE) 1 MG tablet Take 1 tablet (1 mg total) by mouth daily. 09/16/14  Yes Tasrif Ahmed, MD  gabapentin (NEURONTIN) 600 MG tablet Take 1 tablet (600 mg total) by mouth 3 (three) times daily. 02/01/15  Yes [name], MD  insulin aspart (NOVOLOG) 100 UNIT/ML injection Inject 5 Units into the skin 3 (three) times daily with meals. 09/16/14  Yes Tasrif Ahmed, MD  insulin glargine (LANTUS) 100 UNIT/ML injection Inject 0.45 mLs (45 Units total) into the skin daily. Inject 25 units in the morning, and 20 units in the evening, 12 hours apart 10/09/14 10/09/15 Yes [name], MD  lisinopril (PRINIVIL,ZESTRIL) 20 MG tablet Take 1 tablet (20 mg total) by mouth daily. 10/28/14  Yes Ejiroghene Arlyce Dice, MD  metFORMIN (GLUCOPHAGE) 500 MG tablet Take 1 tablet (500 mg total) by mouth daily with breakfast. 10/09/14 10/09/15 Yes [name], MD  pantoprazole (PROTONIX) 40 MG tablet Take 1 tablet (40 mg total) by mouth 2 (two) times daily. 09/16/14  Yes Tasrif Ahmed, MD  polyethylene glycol (MIRALAX / GLYCOLAX) packet Take 17 g by mouth daily as needed. 10/28/14  Yes Ejiroghene E Emokpae, MD  sildenafil  (REVATIO) 20 MG tablet Take 2-5 tablets before sexual activity. 08/25/14  Yes [name], MD  sucralfate (CARAFATE) 1 GM/10ML suspension Take 10 mLs (1 g total) by mouth 4 (four) times daily -  with meals and at bedtime. 10/27/14  Yes [name], MD  ACCU-CHEK FASTCLIX LANCETS MISC 1 each by Does not apply route 4 (four) times daily -  before meals and at bedtime. 03/23/14   Alexa Sherral Hammers, MD  acetaminophen-codeine (TYLENOL #3) 300-30 MG per tablet Take 1-2 tablets by mouth every 4 (four) hours as needed for moderate pain. Patient not taking: Reported on 02/02/2015 10/06/14   Aldine Contes, MD  EASY COMFORT INSULIN SYRINGE 31G X 5/16" 1 ML MISC Use as directed daily for insulin injection. 10/05/14   Historical Provider, MD  glucose blood (ACCU-CHEK SMARTVIEW) test strip 1 each by Other route 4 (four) times daily -  before meals and at bedtime. Use as instructed 07/09/14   [name], MD  nicotine (NICODERM CQ - DOSED IN MG/24 HOURS) 14 mg/24hr patch Place 1 patch onto the skin daily. Patient not taking: Reported on 03/12/2015 03/14/14   [name], MD   BP 168/74 mmHg  Pulse 88  Temp(Src) 98.8 F (37.1 C) (Oral)  Resp 16  SpO2 100% Physical Exam  Constitutional: He is oriented to person, place, and time. He appears well-developed and well-nourished. No distress.  Ill-appearing but nontoxic, no acute distress  HENT:  Head: Normocephalic and atraumatic.  Eyes: Pupils are equal, round, and reactive to light.  Neck: Neck supple.  Cardiovascular: Normal rate, regular rhythm and normal heart sounds.   No murmur heard. Pulmonary/Chest: Effort normal and breath sounds normal. No respiratory distress. He has no wheezes.  Abdominal: Soft. Bowel sounds are normal. There is tenderness. There is no rebound and no guarding.  Epigastric tenderness to palpation without rebound or guarding  Musculoskeletal: He exhibits no edema.  BKA  Lymphadenopathy:    He has no cervical  adenopathy.  Neurological: He is alert and oriented to person, place, and time.  Skin: Skin is warm and dry.  Psychiatric: He has a normal mood and affect.  Nursing note and vitals reviewed.   ED Course  Procedures   CRITICAL CARE Performed by: [name]   Total critical care  time: 35 minutes  Critical care time was exclusive of separately billable procedures and treating other patients.  Critical care was necessary to treat or prevent imminent or life-threatening deterioration.  Critical care was time spent personally by me on the following activities: development of treatment plan with patient and/or surrogate as well as nursing, discussions with consultants, evaluation of patient's response to treatment, examination of patient, obtaining history from patient or surrogate, ordering and performing treatments and interventions, ordering and review of laboratory studies, ordering and review of radiographic studies, pulse oximetry and re-evaluation of patient's condition.  DIAGNOSTIC STUDIES: Oxygen Saturation is 100% on RA, normal by my interpretation.    COORDINATION OF CARE: 12:04 AM - Discussed plans to order diagnostic studies. Pt advised of plan for treatment and pt agrees.  Labs Review Labs Reviewed  CBC WITH DIFFERENTIAL/PLATELET - Abnormal; Notable for the following:    WBC 17.6 (*)    Platelets 474 (*)    Neutro Abs 15.8 (*)    All other components within normal limits  COMPREHENSIVE METABOLIC PANEL - Abnormal; Notable for the following:    Sodium 134 (*)    Chloride 89 (*)    Glucose, Bld 509 (*)    BUN 28 (*)    Creatinine, Ser 1.67 (*)    Total Protein 8.8 (*)    Alkaline Phosphatase 136 (*)    Total Bilirubin 1.3 (*)    GFR calc non Af Amer 47 (*)    GFR calc Af Amer 54 (*)    Anion gap 19 (*)    All other components within normal limits  URINALYSIS, ROUTINE W REFLEX MICROSCOPIC (NOT AT [facility]) - Abnormal; Notable for the following:    Glucose, UA  >1000 (*)    Hgb urine dipstick SMALL (*)    Ketones, ur 15 (*)    Protein, ur 30 (*)    All other components within normal limits  CBG MONITORING, ED - Abnormal; Notable for the following:    Glucose-Capillary 481 (*)    All other components within normal limits  CBG MONITORING, ED - Abnormal; Notable for the following:    Glucose-Capillary 384 (*)    All other components within normal limits  LIPASE, BLOOD  URINE MICROSCOPIC-ADD ON  ETHANOL    Imaging Review No results found. I have personally reviewed and evaluated these images and lab results as part of my medical decision-making.   EKG Interpretation None      Medications  diphenhydrAMINE (BENADRYL) injection 25 mg (not administered)  insulin aspart (novoLOG) injection 10 Units (not administered)  dextrose 5 %-0.45 % sodium chloride infusion (not administered)  insulin regular (NOVOLIN R,HUMULIN R) 250 Units in sodium chloride 0.9 % 250 mL (1 Units/mL) infusion (not administered)  pantoprazole (PROTONIX) injection 40 mg (40 mg Intravenous Given 03/12/15 0102)  ondansetron (ZOFRAN) injection 4 mg (4 mg Intravenous Given 03/12/15 0101)  sodium chloride 0.9 % bolus 1,000 mL (1,000 mLs Intravenous New Bag/Given 03/12/15 0101)  sodium chloride 0.9 % bolus 1,000 mL (1,000 mLs Intravenous New Bag/Given 03/12/15 0205)  morphine 4 MG/ML injection 4 mg (4 mg Intravenous Given 03/12/15 0205)  metoCLOPramide (REGLAN) injection 5 mg (5 mg Intravenous Given 03/12/15 0205)     MDM   Final diagnoses:  Diabetic ketoacidosis without coma associated with other specified diabetes mellitus ([place])  Gastritis    Patient presents with nausea, vomiting, abdominal pain. Hypoglycemia in route. Poorly controlled diabetic. Patient given pain and nausea medication as well as fluids.  Lab work notable for glucose of 509 with an anion gap of 19. Hypochloremia as well and mild acute kidney injury. This could represent DKA versus dehydration. Patient was  given 10 units of insulin.  After 2 L fluid and 10 units of insulin, CBG 384. Patient continues to complain of pain and nausea.  He is followed by the internal medicine teaching service. Discussed with resident. Will initiate glucose stabilizer and admit.  I personally performed the services described in this documentation, which was scribed in my presence. The recorded information has been reviewed and is accurate.   Wesley Hacker, MD 03/12/15 [phone]

## 2015-03-12 NOTE — ED Notes (Signed)
Attempted blood draw with no success. House phlebotomy has been called.

## 2015-03-13 DIAGNOSIS — I10 Essential (primary) hypertension: Secondary | ICD-10-CM | POA: Diagnosis not present

## 2015-03-13 DIAGNOSIS — R112 Nausea with vomiting, unspecified: Secondary | ICD-10-CM | POA: Diagnosis not present

## 2015-03-13 DIAGNOSIS — E785 Hyperlipidemia, unspecified: Secondary | ICD-10-CM | POA: Diagnosis present

## 2015-03-13 DIAGNOSIS — I739 Peripheral vascular disease, unspecified: Secondary | ICD-10-CM | POA: Diagnosis present

## 2015-03-13 DIAGNOSIS — N183 Chronic kidney disease, stage 3 (moderate): Secondary | ICD-10-CM | POA: Diagnosis present

## 2015-03-13 DIAGNOSIS — Z9114 Patient's other noncompliance with medication regimen: Secondary | ICD-10-CM | POA: Diagnosis not present

## 2015-03-13 DIAGNOSIS — Z79899 Other long term (current) drug therapy: Secondary | ICD-10-CM | POA: Diagnosis not present

## 2015-03-13 DIAGNOSIS — Z89511 Acquired absence of right leg below knee: Secondary | ICD-10-CM | POA: Diagnosis not present

## 2015-03-13 DIAGNOSIS — K219 Gastro-esophageal reflux disease without esophagitis: Secondary | ICD-10-CM | POA: Diagnosis present

## 2015-03-13 DIAGNOSIS — I129 Hypertensive chronic kidney disease with stage 1 through stage 4 chronic kidney disease, or unspecified chronic kidney disease: Secondary | ICD-10-CM | POA: Diagnosis present

## 2015-03-13 DIAGNOSIS — F17290 Nicotine dependence, other tobacco product, uncomplicated: Secondary | ICD-10-CM | POA: Diagnosis present

## 2015-03-13 DIAGNOSIS — Z89512 Acquired absence of left leg below knee: Secondary | ICD-10-CM | POA: Diagnosis not present

## 2015-03-13 DIAGNOSIS — K2970 Gastritis, unspecified, without bleeding: Secondary | ICD-10-CM | POA: Diagnosis present

## 2015-03-13 DIAGNOSIS — E1165 Type 2 diabetes mellitus with hyperglycemia: Secondary | ICD-10-CM | POA: Diagnosis not present

## 2015-03-13 DIAGNOSIS — F329 Major depressive disorder, single episode, unspecified: Secondary | ICD-10-CM | POA: Diagnosis present

## 2015-03-13 DIAGNOSIS — E1142 Type 2 diabetes mellitus with diabetic polyneuropathy: Secondary | ICD-10-CM | POA: Diagnosis present

## 2015-03-13 DIAGNOSIS — F17210 Nicotine dependence, cigarettes, uncomplicated: Secondary | ICD-10-CM | POA: Diagnosis present

## 2015-03-13 DIAGNOSIS — Z794 Long term (current) use of insulin: Secondary | ICD-10-CM | POA: Diagnosis not present

## 2015-03-13 DIAGNOSIS — E1310 Other specified diabetes mellitus with ketoacidosis without coma: Secondary | ICD-10-CM | POA: Diagnosis present

## 2015-03-13 LAB — GLUCOSE, CAPILLARY
GLUCOSE-CAPILLARY: 271 mg/dL — AB (ref 65–99)
Glucose-Capillary: 243 mg/dL — ABNORMAL HIGH (ref 65–99)
Glucose-Capillary: 318 mg/dL — ABNORMAL HIGH (ref 65–99)
Glucose-Capillary: 203 mg/dL — ABNORMAL HIGH (ref 65–99)

## 2015-03-13 LAB — COMPREHENSIVE METABOLIC PANEL
ALK PHOS: 103 U/L (ref 38–126)
ALT: 16 U/L — AB (ref 17–63)
AST: 24 U/L (ref 15–41)
Albumin: 3.2 g/dL — ABNORMAL LOW (ref 3.5–5.0)
Anion gap: 10 (ref 5–15)
BILIRUBIN TOTAL: 0.8 mg/dL (ref 0.3–1.2)
BUN: 9 mg/dL (ref 6–20)
CALCIUM: 8.5 mg/dL — AB (ref 8.9–10.3)
CO2: 26 mmol/L (ref 22–32)
CREATININE: 1.29 mg/dL — AB (ref 0.61–1.24)
Chloride: 99 mmol/L — ABNORMAL LOW (ref 101–111)
GFR calc Af Amer: >60 mL/min (ref >60)
Glucose, Bld: 237 mg/dL — ABNORMAL HIGH (ref 65–99)
Potassium: 3.9 mmol/L (ref 3.5–5.1)
Sodium: 135 mmol/L (ref 135–145)
TOTAL PROTEIN: 6.5 g/dL (ref 6.5–8.1)

## 2015-03-13 LAB — CBC
HCT: 37 % — ABNORMAL LOW (ref 39.0–52.0)
Hemoglobin: 12.4 g/dL — ABNORMAL LOW (ref 13.0–17.0)
MCH: 29.5 pg (ref 26.0–34.0)
MCHC: 33.5 g/dL (ref 30.0–36.0)
MCV: 87.9 fL (ref 78.0–100.0)
PLATELETS: 350 10*3/uL (ref 150–400)
RBC: 4.21 MIL/uL — ABNORMAL LOW (ref 4.22–5.81)
RDW: 14.6 % (ref 11.5–15.5)
WBC: 19 10*3/uL — AB (ref 4.0–10.5)

## 2015-03-13 MED ORDER — INSULIN GLARGINE 100 UNIT/ML ~~LOC~~ SOLN: 30.0000 [IU] | SUBCUTANEOUS | Status: DC

## 2015-03-13 MED ORDER — INSULIN GLARGINE 100 UNIT/ML ~~LOC~~ SOLN
30.0000 [IU] | SUBCUTANEOUS | Status: DC
  Administered 2015-03-13: 30 [IU] via SUBCUTANEOUS
  Filled 2015-03-13 (×3): qty 0.3

## 2015-03-13 MED ORDER — PANTOPRAZOLE SODIUM 40 MG PO TBEC
40.0000 mg | DELAYED_RELEASE_TABLET | Freq: Two times a day (BID) | ORAL | Status: DC
  Administered 2015-03-13 – 2015-03-14 (×2): 40 mg via ORAL
  Filled 2015-03-13 (×2): qty 1

## 2015-03-13 NOTE — H&P (Signed)
Date: 03/13/2015               Patient Name:  [name] MRN: [id number]  DOB: 09-11-1965 Age / Sex: 49 y.o., male   PCP: [name], MD         Medical Service: Internal Medicine Teaching Service         Attending Physician: Dr. Sid Falcon, MD    First Contact: Dr. Blane Ohara Pager: [phone]  Second Contact: Dr. Jacques Earthly Pager: [phone]       After Hours (After 5p/  First Contact Pager: [phone]  weekends / holidays): Second Contact Pager: [phone]   Chief Complaint: Nausea, vomiting, abdominal pain  History of Present Illness: [name] is a 49yo M 2ppd smoker w PMH of poortly-controlled IDDM, HTN, CKD who presented to the [place] ED around near midnight on 12/1 with nausea, vomiting, diarrhea, and epigastric abdominal pain. He said he was in his usual state of health and drank 'some mixed drinks' on an empty stomach the night before, waking up 12/1 with these symptoms that did not relent throughout the day which prompted him to come in to the ED. There, he was found to have glucose of 509, gap of 19, normal K, WBC 17.6, ketones in urine so was started on insulin drip for presumed DKA and transferred here to [facility]. Here, he says his pain is improving, is not vomiting at all but does still have nausea as well. He denies fevers, chills, night sweats, chest pain, palpitations, shortness of breath, urinary symptoms, focal weakness or numbness, syncope, URI symptoms or any other recent illness.  Meds: Current Facility-Administered Medications  Medication Dose Route Frequency Provider Last Rate Last Dose  . acetaminophen (TYLENOL) tablet 650 mg  650 mg Oral Q6H PRN Milagros Loll, MD       Or  . acetaminophen (TYLENOL) suppository 650 mg  650 mg Rectal Q6H PRN Milagros Loll, MD      . alum & mag hydroxide-simeth (MAALOX/MYLANTA) 200-200-20 MG/5ML suspension 30 mL  30 mL Oral Q6H PRN Milagros Loll, MD   30 mL at 03/12/15 2206  . amLODipine (NORVASC) tablet 10  mg  10 mg Oral Daily Milagros Loll, MD   10 mg at 03/13/15 0953  . dextrose 5 %-0.45 % sodium chloride infusion   Intravenous Continuous [name], MD 75 mL/hr at 03/13/15 0703    . diphenhydrAMINE (BENADRYL) capsule 25 mg  25 mg Oral QHS PRN Sid Falcon, MD      . enoxaparin (LOVENOX) injection 40 mg  40 mg Subcutaneous Q24H Milagros Loll, MD   40 mg at 03/12/15 1733  . feeding supplement (GLUCERNA SHAKE) (GLUCERNA SHAKE) liquid 237 mL  237 mL Oral BID BM Milagros Loll, MD   237 mL at 03/13/15 0955  . ferrous sulfate tablet 325 mg  325 mg Oral Q breakfast Milagros Loll, MD   325 mg at 03/13/15 0813  . FLUoxetine (PROZAC) capsule 10 mg  10 mg Oral Daily Milagros Loll, MD   10 mg at 03/13/15 0959  . folic acid (FOLVITE) tablet 1 mg  1 mg Oral Daily Milagros Loll, MD   1 mg at 03/13/15 [id number]  . gabapentin (NEURONTIN) tablet 600 mg  600 mg Oral TID Milagros Loll, MD   600 mg at 03/13/15 0953  . HYDROcodone-acetaminophen (NORCO/VICODIN) 5-325 MG per tablet 1 tablet  1 tablet Oral Q4H PRN  [name], MD   1 tablet at 03/13/15 [phone]  . insulin aspart (novoLOG) injection 0-15 Units  0-15 Units Subcutaneous TID WC Milagros Loll, MD   5 Units at 03/13/15 0813  . insulin glargine (LANTUS) injection 22 Units  22 Units Subcutaneous Q24H Milagros Loll, MD   22 Units at 03/12/15 1733  . pantoprazole (PROTONIX) injection 40 mg  40 mg Intravenous Q12H [name], MD   40 mg at 03/13/15 0953  . prochlorperazine (COMPAZINE) injection 10 mg  10 mg Intravenous Q6H PRN [name], MD   10 mg at 03/13/15 0159  . sodium chloride 0.9 % injection 3 mL  3 mL Intravenous Q12H Milagros Loll, MD   3 mL at 03/12/15 2200  . sucralfate (CARAFATE) 1 GM/10ML suspension 1 g  1 g Oral TID WC & HS Milagros Loll, MD   1 g at 03/13/15 [id number]    Allergies: Allergies as of 03/11/2015  . (No Known Allergies)   Past Medical History  Diagnosis Date  . Onychomycosis   . Hypertension   .  Diabetic peripheral neuropathy ([place])   . Diabetic foot ulcer ([place])     s/p Right first ray amputation, left transmetatarsal amputation with revision  . History of drug abuse     Cocaine and marijuana  . Exposure to trichomonas     treated empirically  . Cellulitis of left foot 03/2008    left 4th and 5th metatarsal area  . Hyperlipidemia   . 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 Mallory [name] tear April 2009  . History of prolonged Q-T interval on ECG   . 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  . 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 CKD (chronic kidney disease) stage 2, GFR 60-89 ml/min     BL SCr 1.3-1.4  . Depression   . Peripheral vascular disease ([place])   . Type 2 diabetes mellitus, uncontrolled, with renal complications ([place])   . GERD (gastroesophageal reflux disease)   . Hx of right BKA ([place]) 09/13/2012     Due to severe wound infection with sepsis  . 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 Cellulitis and abscess of leg 01/31/2014  . Osteomyelitis of left leg ([place]) 01/21/2014  . History of left below knee amputation ([place]) 01/21/2014    Performed on    . Status post below knee amputation of right lower extremity ([place]) 03/23/2014   Past Surgical History  Procedure Laterality Date  . Toe amputation  06/2011    left; 4th toe  . Nephrectomy      L side  . I&d extremity  08/24/2011    Procedure: IRRIGATION AND DEBRIDEMENT EXTREMITY;  Surgeon: Meredith Pel, MD;  Location: [place];  Service: Orthopedics;  Laterality: Left;  . Amputation  08/24/2011    Procedure: AMPUTATION RAY;  Surgeon: Meredith Pel,  MD;  Location: [place];  Service: Orthopedics;  Laterality: Left;  left fourth toe Ray Resection  . Amputation  12/05/2011    Procedure: AMPUTATION RAY;  Surgeon: Meredith Pel, MD;  Location: [place];  Service: Orthopedics;  Laterality: Left;  . I&d extremity  02/18/2012    Procedure: IRRIGATION AND DEBRIDEMENT EXTREMITY;  Surgeon: Mcarthur Rossetti, MD;  Location: [place];  Service: Orthopedics;  Laterality: Left;  . Amputation  02/18/2012    Procedure: AMPUTATION DIGIT;  Surgeon: Mcarthur Rossetti, MD;  Location: [place];  Service: Orthopedics;  Laterality: Left;  revison transmetatarsal  . I&d extremity Left 08/28/2012    Procedure: IRRIGATION AND DEBRIDEMENT EXTREMITY;  Surgeon: Linna Hoff, MD;  Location: [place];  Service: Orthopedics;  Laterality: Left;  CYSTO TUBING/IRRIGATION, LEAD HAND.  . I&d extremity Left 08/30/2012    Procedure: IRRIGATION AND DEBRIDEMENT EXTREMITY;  Surgeon: Linna Hoff, MD;  Location: [place];  Service: Orthopedics;  Laterality: Left;  . Amputation Right 09/01/2012    Procedure: FOOT 1ST RAY AMPUTATION;  Surgeon: Newt Minion, MD;  Location: [place];  Service: Orthopedics;  Laterality: Right;  . Amputation Left 09/01/2012    Procedure: FOOT REVISION TRANSMETATARSAL AMPUTATION ;  Surgeon: Newt Minion, MD;  Location: [place];  Service: Orthopedics;  Laterality: Left;  . Amputation Right 09/13/2012    Procedure: AMPUTATION BELOW KNEE;  Surgeon: Newt Minion, MD;  Location: [place];  Service: Orthopedics;  Laterality: Right;  Right Below Knee Amputation  . Amputation Left 12/12/2013    Procedure: Left Below Knee Amputation;  Surgeon: Newt Minion, MD;  Location: [place];  Service: Orthopedics;  Laterality: Left;  . Stump revision Left 01/23/2014    Procedure: STUMP REVISION;  Surgeon: Newt Minion, MD;  Location: [place];  Service: Orthopedics;  Laterality: Left;  . Esophagogastroduodenoscopy (egd) with propofol N/A 09/16/2014    Procedure: ESOPHAGOGASTRODUODENOSCOPY  (EGD) WITH PROPOFOL;  Surgeon: Wonda Horner, MD;  Location: [facility];  Service: Endoscopy;  Laterality: N/A;   Family History  Problem Relation Age of Onset  . Diabetes type II Mother   . Pancreatic cancer Father    Social History   Social History  . Marital Status: Married    Spouse Name: N/A  . Number of Children: N/A  . Years of Education: 12   Occupational History  . disabled    Social History Main Topics  . Smoking status: Current Every Day Smoker -- 2.00 packs/day for 34 years    Types: Cigarettes, Cigars  . Smokeless tobacco: Never Used     Comment: 1/2 PPD  . Alcohol Use: 0.0 oz/week    0 Standard drinks or equivalent per week     Comment: occ  . Drug Use: Yes    Special: Marijuana     Comment: NO LONGER USES-GRADUATING FROM GEO CRE AFTER  3-4 MONTHS                                                                                                                                       . Sexual Activity: Yes  Birth Control/ Protection: None   Other Topics Concern  . Not on file   Social History Narrative    Review of Systems: Pertinent items noted in HPI and remainder of comprehensive ROS otherwise negative.  Physical Exam: Blood pressure 173/93, pulse 91, temperature 97.9 F (36.6 C), temperature source Oral, resp. rate 20, height 5\' 8"  (1.727 m), weight 210 lb (95.255 kg), SpO2 98 %.   Gen: Well-appearing, alert and oriented to person, place, and time HEENT: Oropharynx clear without erythema or exudate.  Neck: No cervical LAD, no thyromegaly or nodules, no JVD noted. CV: Normal rate, regular rhythm, no murmurs, rubs, or gallops Pulmonary: Normal effort, CTA bilaterally, no wheezing, rales, or rhonchi Abdominal: Soft, non-tender, non-distended, without rebound, guarding, or masses Extremities: Distal pulses 2+ in upper and lower extremities bilaterally, no tenderness, erythema or edema Skin: No atypical appearing moles. No rashes  Lab results: Basic  Metabolic Panel:  Recent Labs  03/12/15 1650 03/12/15 1945 03/13/15 0546  NA 138  --  135  K 3.6  --  3.9  CL 103  --  99*  CO2 26  --  26  GLUCOSE 166*  --  237*  BUN 16  --  9  CREATININE 1.33*  --  1.29*  CALCIUM 8.6*  --  8.5*  MG  --  2.1  --   PHOS  --  1.7*  --    Liver Function Tests:  Recent Labs  03/12/15 0019 03/13/15 0546  AST 23 24  ALT 17 16*  ALKPHOS 136* 103  BILITOT 1.3* 0.8  PROT 8.8* 6.5  ALBUMIN 4.6 3.2*    Recent Labs  03/12/15 0019  LIPASE 31  CBC:  Recent Labs  03/12/15 0019 03/13/15 0546  WBC 17.6* 19.0*  NEUTROABS 15.8*  --   HGB 14.2 12.4*  HCT 41.3 37.0*  MCV 86.9 87.9  PLT 474* 350  CBG:  Recent Labs  03/12/15 1413 03/12/15 1550 03/12/15 1732 03/12/15 1941 03/12/15 2208 03/13/15 0741  GLUCAP 125* 124* 146* 150* 181* 243*   Urine Drug Screen: Drugs of Abuse     Component Value Date/Time   LABOPIA POSITIVE* 10/27/2014 0452   COCAINSCRNUR NONE DETECTED 10/27/2014 0452   COCAINSCRNUR NEG 09/22/2008 2125   LABBENZ NONE DETECTED 10/27/2014 0452   LABBENZ NEG 09/22/2008 2125   AMPHETMU NONE DETECTED 10/27/2014 0452   AMPHETMU NEG 09/22/2008 2125   THCU POSITIVE* 10/27/2014 0452   LABBARB NONE DETECTED 10/27/2014 0452    Alcohol Level:  Recent Labs  03/12/15 0154  ETH <5   Urinalysis:  Recent Labs  03/12/15 0026  COLORURINE YELLOW  LABSPEC 1.028  PHURINE 5.5  GLUCOSEU >1000*  HGBUR SMALL*  BILIRUBINUR NEGATIVE  KETONESUR 15*  PROTEINUR 30*  NITRITE NEGATIVE  LEUKOCYTESUR NEGATIVE   Other results: EKG: sinus tachycardia.  Assessment & Plan by Problem: Active Problems:   DKA (diabetic ketoacidoses) (HCC)   Intractable vomiting   Hyperglycemia due to type 2 diabetes mellitus ([place]) 1. DKA - nausea, vomiting, diarrhea, and abdominal pain likely precipitated by heavy drinking on empty stomach and medication non-compliance. On metformin and lantus at home. Initially had gap of 19, glu 509, lipase  negative. Insulin drip was initiated and gap is closed now at time of transfer. However, he is still having difficulty with PO intake at this time so transition to oral meds isn't currently feasible. Abdominal pain is improving. No more vomiting. CBG on arrival here is in mid 100s. -D/c insulin drip  and switch to lantus.22U q24hr -Trend CBG, BMPs, CBC -D5 1/2NS -Continue protonix and carafate for abdmoninal pain, GI cocktail PRN -Zofran PRN for nausea -Tylenol and norco PRN for pain -Transition diet as tolerated  2. GERD - could be contributing to epigastric pain in setting of negative lipase, although DKA is most likely precipitant -Continue protonix and carafate  Dispo: Disposition is deferred at this time, awaiting improvement of current medical problems. Anticipated discharge in approximately 1-2 day(s).   The patient does have a current PCP [name], MD) and does need an [facility] hospital follow-up appointment after discharge.  The patient does not have transportation limitations that hinder transportation to clinic appointments.  Signed: Norval Gable, MD 03/13/2015, 10:50 AM

## 2015-03-13 NOTE — Progress Notes (Signed)
Subjective: [name] continues to have some intermittent nausea and abdominal pain, which is improving. He denies vomiting, diarrhea, distension, or other symptoms at this time.  Objective: Vital signs in last 24 hours: Filed Vitals:   03/12/15 2138 03/13/15 0339 03/13/15 0631 03/13/15 1451  BP: 175/74 175/96 173/93 155/97  Pulse: 108 101 91 111  Temp: 98.7 F (37.1 C) 98.3 F (36.8 C) 97.9 F (36.6 C) 98.7 F (37.1 C)  TempSrc: Oral Oral Oral Oral  Resp: 20 20 20 19   Height:      Weight:      SpO2: 97% 96% 98% 100%   Weight change:   Intake/Output Summary (Last 24 hours) at 03/13/15 1532 Last data filed at 03/13/15 1400  Gross per 24 hour  Intake   2298 ml  Output   3285 ml  Net   -987 ml     Gen: Well-appearing, alert and oriented to person, place, and time HEENT: Oropharynx clear without erythema or exudate.  Neck: No cervical LAD, no thyromegaly or nodules, no JVD noted. CV: Normal rate, regular rhythm, no murmurs, rubs, or gallops Pulmonary: Normal effort, CTA bilaterally, no wheezing, rales, or rhonchi Abdominal: Soft, non-tender, non-distended, without rebound, guarding, or masses Extremities: Distal pulses 2+ in upper and lower extremities bilaterally, no tenderness, erythema or edema Skin: No atypical appearing moles. No rashes  Lab Results: Basic Metabolic Panel:  Recent Labs Lab 03/12/15 1650 03/12/15 1945 03/13/15 0546  NA 138  --  135  K 3.6  --  3.9  CL 103  --  99*  CO2 26  --  26  GLUCOSE 166*  --  237*  BUN 16  --  9  CREATININE 1.33*  --  1.29*  CALCIUM 8.6*  --  8.5*  MG  --  2.1  --   PHOS  --  1.7*  --    Liver Function Tests:  Recent Labs Lab 03/12/15 0019 03/13/15 0546  AST 23 24  ALT 17 16*  ALKPHOS 136* 103  BILITOT 1.3* 0.8  PROT 8.8* 6.5  ALBUMIN 4.6 3.2*    Recent Labs Lab 03/12/15 0019  LIPASE 31    CBC:  Recent Labs Lab 03/12/15 0019 03/13/15 0546  WBC 17.6* 19.0*  NEUTROABS 15.8*  --   HGB 14.2  12.4*  HCT 41.3 37.0*  MCV 86.9 87.9  PLT 474* 350   CBG:  Recent Labs Lab 03/12/15 1550 03/12/15 1732 03/12/15 1941 03/12/15 2208 03/13/15 0741 03/13/15 1216  GLUCAP 124* 146* 150* 181* 243* 318*  Assessment/Plan: Active Problems:   DKA (diabetic ketoacidoses) (HCC)   Intractable vomiting   Hyperglycemia due to type 2 diabetes mellitus ([place]) 1. DKA - nausea, vomiting, diarrhea, and abdominal pain likely precipitated by heavy drinking on empty stomach and medication non-compliance. On metformin and lantus at home. Initially had gap of 19, glu 509, lipase negative. Insulin drip was initiated and gap is closed now at time of transfer. However, he is still having difficulty with PO intake at this time so transition to oral meds isn't currently feasible. Abdominal pain is improving. No more vomiting. CBG on arrival here is in mid 100s. -D/c insulin drip and switched to lantus.22U q24hr -Trend CBG, BMPs, CBC -D5 1/2NS -Continue protonix and carafate for abdmoninal pain, GI cocktail PRN -Zofran PRN for nausea -Tylenol and norco PRN for pain -Transition diet as tolerated  2. GERD - could be contributing to epigastric pain in setting of negative lipase, although DKA  is most likely precipitant -Continue protonix and carafate  Dispo: Disposition is deferred at this time, awaiting improvement of current medical problems.  Anticipated discharge tomorrow.   LOS: 1 day   Wesley Gable, MD 03/13/2015, 3:32 PM

## 2015-03-14 DIAGNOSIS — K2970 Gastritis, unspecified, without bleeding: Secondary | ICD-10-CM | POA: Insufficient documentation

## 2015-03-14 DIAGNOSIS — E1310 Other specified diabetes mellitus with ketoacidosis without coma: Principal | ICD-10-CM

## 2015-03-14 DIAGNOSIS — E1110 Type 2 diabetes mellitus with ketoacidosis without coma: Secondary | ICD-10-CM | POA: Insufficient documentation

## 2015-03-14 DIAGNOSIS — I10 Essential (primary) hypertension: Secondary | ICD-10-CM

## 2015-03-14 LAB — BASIC METABOLIC PANEL
ANION GAP: 7 (ref 5–15)
BUN: 7 mg/dL (ref 6–20)
CALCIUM: 8.6 mg/dL — AB (ref 8.9–10.3)
CO2: 31 mmol/L (ref 22–32)
Chloride: 96 mmol/L — ABNORMAL LOW (ref 101–111)
Creatinine, Ser: 1.27 mg/dL — ABNORMAL HIGH (ref 0.61–1.24)
GFR calc Af Amer: >60 mL/min (ref >60)
GLUCOSE: 282 mg/dL — AB (ref 65–99)
Potassium: 3.7 mmol/L (ref 3.5–5.1)
Sodium: 134 mmol/L — ABNORMAL LOW (ref 135–145)

## 2015-03-14 LAB — CBC
HEMATOCRIT: 38.5 % — AB (ref 39.0–52.0)
HEMOGLOBIN: 12.5 g/dL — AB (ref 13.0–17.0)
MCH: 28.9 pg (ref 26.0–34.0)
MCHC: 32.5 g/dL (ref 30.0–36.0)
MCV: 88.9 fL (ref 78.0–100.0)
Platelets: 348 10*3/uL (ref 150–400)
RBC: 4.33 MIL/uL (ref 4.22–5.81)
RDW: 13.7 % (ref 11.5–15.5)
WBC: 10.3 10*3/uL (ref 4.0–10.5)

## 2015-03-14 LAB — GLUCOSE, CAPILLARY
GLUCOSE-CAPILLARY: 288 mg/dL — AB (ref 65–99)
Glucose-Capillary: 288 mg/dL — ABNORMAL HIGH (ref 65–99)
Glucose-Capillary: 288 mg/dL — ABNORMAL HIGH (ref 65–99)

## 2015-03-14 LAB — MAGNESIUM: MAGNESIUM: 2.2 mg/dL (ref 1.7–2.4)

## 2015-03-14 LAB — PHOSPHORUS: PHOSPHORUS: 2.1 mg/dL — AB (ref 2.5–4.6)

## 2015-03-14 MED ORDER — INSULIN GLARGINE 100 UNIT/ML ~~LOC~~ SOLN
40.0000 [IU] | SUBCUTANEOUS | Status: DC
  Administered 2015-03-14: 40 [IU] via SUBCUTANEOUS
  Filled 2015-03-14: qty 0.4

## 2015-03-14 MED ORDER — LISINOPRIL 20 MG PO TABS
20.0000 mg | ORAL_TABLET | Freq: Every day | ORAL | Status: DC
  Administered 2015-03-14: 20 mg via ORAL
  Filled 2015-03-14: qty 1

## 2015-03-14 MED ORDER — AMLODIPINE BESYLATE 10 MG PO TABS: 10.0000 mg | ORAL_TABLET | Freq: Every day | ORAL | Status: DC

## 2015-03-14 NOTE — Progress Notes (Signed)
Discharge instructions gone over with patient. Home medications gone over. Follow up appointment to be made. Diet and reasons to call the doctor gone over. Cab voucher supplied for patient. Patient verbalized understanding of instructions.

## 2015-03-14 NOTE — Discharge Instructions (Signed)
Diabetic Ketoacidosis Diabetic ketoacidosis is a life-threatening complication of diabetes. If it is not treated, it can cause severe dehydration and organ damage and can lead to a coma or death. HOME CARE INSTRUCTIONS Eating and Drinking  Drink enough fluids to keep your urine clear or pale yellow.  If you cannot eat, alternate between drinking fluids with sugar (such as juice) and salty fluids (such as broth or bouillon).  If you can eat, follow your usual diet and drink sugar-free liquids, such as water.  Other Instructions  Take insulin as directed by your health care provider. Do not use expired insulin.  Rest and exercise only as directed by your health care provider.  If you get sick, call your health care provider and begin treatment quickly. Your body often needs extra insulin to fight an illness.  Check your blood glucose levels regularly. If your blood glucose is high, drink plenty of fluids. This helps to flush out ketones. SEEK MEDICAL CARE IF:  Your blood glucose level is too high or too low.  You have a fever.  You cannot tolerate fluids.  You have been vomiting for more than 2 hours.  You continue to have symptoms of this condition.  You develop new symptoms. SEEK IMMEDIATE MEDICAL CARE IF:  Your blood glucose levels continue to be high (elevated).  Your monitor reads "high" even when you are taking insulin.  You faint.  You have chest pain.  You have trouble breathing.  You have a sudden, severe headache.  You have sudden weakness in one arm or one leg.  You have sudden trouble speaking or swallowing.  You have vomiting or diarrhea that gets worse after 3 hours.  You feel severely fatigued.  You have trouble thinking.  You have abdominal pain.  You are severely dehydrated. Symptoms of severe dehydration include:  Extreme thirst.  Dry mouth.  Blue lips.  Cold hands and feet.  Rapid breathing.   This information is not intended to  replace advice given to you by your health care provider. Make sure you discuss any questions you have with your health care provider.   Document Released: 03/24/2000 Document Revised: 08/11/2014 Document Reviewed: 03/04/2014 Elsevier Interactive Patient Education [employer].

## 2015-03-14 NOTE — Progress Notes (Signed)
Subjective: Doing well this morning. Tolerating PO intake. Denies emesis or abdominal pain.  Objective: Vital signs in last 24 hours: Filed Vitals:   03/13/15 1451 03/13/15 2154 03/14/15 0204 03/14/15 0618  BP: 155/97 163/99 151/92 174/104  Pulse: 111 101 98 113  Temp: 98.7 F (37.1 C) 98.8 F (37.1 C) 98.8 F (37.1 C) 98.4 F (36.9 C)  TempSrc: Oral Oral    Resp: 19 18 18 19   Height:      Weight:      SpO2: 100% 98% 97% 99%   Weight change:   Intake/Output Summary (Last 24 hours) at 03/14/15 0903 Last data filed at 03/14/15 [id number]  Gross per 24 hour  Intake   1860 ml  Output   4360 ml  Net  -2500 ml   General Apperance: NAD HEENT: Normocephalic, atraumatic, PERRL, EOMI, anicteric sclera Neck: Supple, trachea midline Lungs: Clear to auscultation bilaterally. No wheezes, rhonchi or rales. Breathing comfortably Heart: Regular rate and rhythm, no murmur/rub/gallop Abdomen: Soft, nontender, nondistended, no rebound/guarding Extremities: Warm and well perfused, no edema, B BKA Neurologic: No gross deficits  Lab Results: Basic Metabolic Panel:  Recent Labs Lab 03/12/15 1650 03/12/15 1945 03/13/15 0546  NA 138  --  135  K 3.6  --  3.9  CL 103  --  99*  CO2 26  --  26  GLUCOSE 166*  --  237*  BUN 16  --  9  CREATININE 1.33*  --  1.29*  CALCIUM 8.6*  --  8.5*  MG  --  2.1  --   PHOS  --  1.7*  --    Liver Function Tests:  Recent Labs Lab 03/12/15 0019 03/13/15 0546  AST 23 24  ALT 17 16*  ALKPHOS 136* 103  BILITOT 1.3* 0.8  PROT 8.8* 6.5  ALBUMIN 4.6 3.2*    Recent Labs Lab 03/12/15 0019  LIPASE 31    CBC:  Recent Labs Lab 03/12/15 0019 03/13/15 0546 03/14/15 0824  WBC 17.6* 19.0* 10.3  NEUTROABS 15.8*  --   --   HGB 14.2 12.4* 12.5*  HCT 41.3 37.0* 38.5*  MCV 86.9 87.9 88.9  PLT 474* 350 348   CBG:  Recent Labs Lab 03/12/15 2208 03/13/15 0741 03/13/15 1216 03/13/15 1712 03/13/15 2208 03/14/15 0748  GLUCAP 181* 243* 318*  203* 271* 288*   Urine Drug Screen: Drugs of Abuse     Component Value Date/Time   LABOPIA POSITIVE* 10/27/2014 0452   COCAINSCRNUR NONE DETECTED 10/27/2014 0452   COCAINSCRNUR NEG 09/22/2008 2125   LABBENZ NONE DETECTED 10/27/2014 0452   LABBENZ NEG 09/22/2008 2125   AMPHETMU NONE DETECTED 10/27/2014 0452   AMPHETMU NEG 09/22/2008 2125   THCU POSITIVE* 10/27/2014 0452   LABBARB NONE DETECTED 10/27/2014 0452    Alcohol Level:  Recent Labs Lab 03/12/15 0154  ETH <5   Urinalysis:  Recent Labs Lab 03/12/15 0026  COLORURINE YELLOW  LABSPEC 1.028  PHURINE 5.5  GLUCOSEU >1000*  HGBUR SMALL*  BILIRUBINUR NEGATIVE  KETONESUR 15*  PROTEINUR 30*  NITRITE NEGATIVE  LEUKOCYTESUR NEGATIVE   Medications: I have reviewed the patient's current medications. Scheduled Meds: . amLODipine  10 mg Oral Daily  . enoxaparin (LOVENOX) injection  40 mg Subcutaneous Q24H  . feeding supplement (GLUCERNA SHAKE)  237 mL Oral BID BM  . ferrous sulfate  325 mg Oral Q breakfast  . FLUoxetine  10 mg Oral Daily  . folic acid  1 mg Oral Daily  .  gabapentin  600 mg Oral TID  . insulin aspart  0-15 Units Subcutaneous TID WC  . insulin glargine  30 Units Subcutaneous Q24H  . pantoprazole  40 mg Oral BID  . sodium chloride  3 mL Intravenous Q12H  . sucralfate  1 g Oral TID WC & HS   Continuous Infusions: . dextrose 5 % and 0.45% NaCl 75 mL/hr at 03/14/15 0809   PRN Meds:.acetaminophen **OR** acetaminophen, alum & mag hydroxide-simeth, diphenhydrAMINE, HYDROcodone-acetaminophen, prochlorperazine Assessment/Plan: 49 year old man with poorly controlled DM2 presenting with N/V found to be in DKA.  DKA: Likely precipitated by gastritis +/- medication noncompliance. Gap remains closed. Nausea and epigastric pain improved and tolerating more PO intake. Likely lingering symptoms of DKA superimposed on gastritis. -Increase Lantus to 30u daily -D/c IV fluids -Carafate, Protonix, Maalox prn -Zofran prn  nausea. Has not required this. -Carb mod diet  GERD: Continue carafate and protonix.  AKI on CKD3: Baseline creatinine around 1.2. Cr up to 1.67 on admission. Now down to 1.27. Improving.  VTE ppx: Lovenox  Dispo: Likely home today  The patient does have a current PCP [name], MD) and does need an [facility] hospital follow-up appointment after discharge.  The patient does not have transportation limitations that hinder transportation to clinic appointments.  .Services Needed at time of discharge: Y = Yes, Blank = No PT:   OT:   RN:   Equipment:   Other:     LOS: 2 days   Milagros Loll, MD 03/14/2015, 9:03 AM

## 2015-03-15 NOTE — Discharge Summary (Signed)
Name: AKHIR COWIN MRN: [id number] DOB: [birth year] 49 y.o. PCP: [name], MD  Date of Admission: 03/11/2015 11:23 PM Date of Discharge: 03/15/2015 Attending Physician: No att. providers found  Discharge Diagnosis: 1. DKA  Active Problems:   DKA (diabetic ketoacidoses) (HCC)   Intractable vomiting   Hyperglycemia due to type 2 diabetes mellitus ([place])   Diabetic ketoacidosis without coma ([place])   Gastritis  Discharge Medications:   Medication List    STOP taking these medications        acetaminophen-codeine 300-30 MG tablet  Commonly known as:  TYLENOL #3     nicotine 14 mg/24hr patch  Commonly known as:  NICODERM CQ - dosed in mg/24 hours      TAKE these medications        ACCU-CHEK FASTCLIX LANCETS Misc  1 each by Does not apply route 4 (four) times daily -  before meals and at bedtime.     amLODipine 10 MG tablet  Commonly known as:  NORVASC  Take 1 tablet (10 mg total) by mouth daily.     docusate sodium 100 MG capsule  Commonly known as:  COLACE  Take 1 capsule (100 mg total) by mouth daily as needed.     EASY COMFORT INSULIN SYRINGE 31G X 5/16" 1 ML Misc  Generic drug:  Insulin Syringe-Needle U-100  Use as directed daily for insulin injection.     feeding supplement (GLUCERNA SHAKE) Liqd  Take 237 mLs by mouth 2 (two) times daily between meals.     ferrous sulfate 325 (65 FE) MG tablet  Take 1 tablet (325 mg total) by mouth daily with breakfast.     FLUoxetine 10 MG capsule  Commonly known as:  PROZAC  Take 1 capsule (10 mg total) by mouth daily.     folic acid 1 MG tablet  Commonly known as:  FOLVITE  Take 1 tablet (1 mg total) by mouth daily.     gabapentin 600 MG tablet  Commonly known as:  NEURONTIN  Take 1 tablet (600 mg total) by mouth 3 (three) times daily.     glucose blood test strip  Commonly known as:  ACCU-CHEK SMARTVIEW  1 each by Other route 4 (four) times daily -  before meals and at bedtime. Use as instructed     insulin  aspart 100 UNIT/ML injection  Commonly known as:  novoLOG  Inject 5 Units into the skin 3 (three) times daily with meals.     insulin glargine 100 UNIT/ML injection  Commonly known as:  LANTUS  Inject 0.45 mLs (45 Units total) into the skin daily. Inject 25 units in the morning, and 20 units in the evening, 12 hours apart     lisinopril 20 MG tablet  Commonly known as:  PRINIVIL,ZESTRIL  Take 1 tablet (20 mg total) by mouth daily.     metFORMIN 500 MG tablet  Commonly known as:  GLUCOPHAGE  Take 1 tablet (500 mg total) by mouth daily with breakfast.     pantoprazole 40 MG tablet  Commonly known as:  PROTONIX  Take 1 tablet (40 mg total) by mouth 2 (two) times daily.     polyethylene glycol packet  Commonly known as:  MIRALAX / GLYCOLAX  Take 17 g by mouth daily as needed.     sildenafil 20 MG tablet  Commonly known as:  REVATIO  Take 2-5 tablets before sexual activity.     sucralfate 1 GM/10ML suspension  Commonly known as:  CARAFATE  Take 10 mLs (1 g total) by mouth 4 (four) times daily -  with meals and at bedtime.        Disposition and follow-up:   Mr.Covey B Wilms was discharged from [facility] in Good condition.  At the hospital follow up visit please address:  1.  Diabetes control/compliance  2.  Labs / imaging needed at time of follow-up: None  3.  Pending labs/ test needing follow-up: None  Follow-up Appointments:     Follow-up Information    Follow up with Medicaid [place] [facility] Covered Patient .   Why:  [place]: [place]: [id number] [place], [place] [address] [email or web address] USE THIS SITE TO ASSIST WITH UNDERSTANDING YOUR COVERAGE, RENEW APPLICATION, As needed   Contact information:   As a Medicaid client you MUST contact DSS/SSI each time you change address, move to another [place] or another state to keep your address updated        Follow up with [name], MD.   Specialty:  Internal Medicine    Why:  An appointment will be made for you this week. If you do not hear from the clinic by Wednesday, please call to set up an appointment.   Contact information:   [address]  [place] [address] [phone]       Discharge Instructions: Discharge Instructions    Call MD for:  difficulty breathing, headache or visual disturbances    Complete by:  As directed      Call MD for:  persistant dizziness or light-headedness    Complete by:  As directed      Call MD for:  persistant nausea and vomiting    Complete by:  As directed      Call MD for:  temperature >100.4    Complete by:  As directed      Diet - low sodium heart healthy    Complete by:  As directed      Increase activity slowly    Complete by:  As directed           Procedures Performed:  No results found.  Admission HPI: Mr. Roome is a 49yo M 2ppd smoker w PMH of poortly-controlled IDDM, HTN, CKD who presented to the [place] ED around near midnight on 12/1 with nausea, vomiting, diarrhea, and epigastric abdominal pain. He said he was in his usual state of health and drank 'some mixed drinks' on an empty stomach the night before, waking up 12/1 with these symptoms that did not relent throughout the day which prompted him to come in to the ED. There, he was found to have glucose of 509, gap of 19, normal K, WBC 17.6, ketones in urine so was started on insulin drip for presumed DKA and transferred here to [facility]. Here, he says his pain is improving, is not vomiting at all but does still have nausea as well. He denies fevers, chills, night sweats, chest pain, palpitations, shortness of breath, urinary symptoms, focal weakness or numbness, syncope, URI symptoms or any other recent illness  Hospital Course by problem list: Active Problems:   DKA (diabetic ketoacidoses) (HCC)   Intractable vomiting   Hyperglycemia due to type 2 diabetes mellitus ([place])   Diabetic ketoacidosis without coma ([place])   Gastritis   1.  DKA - Likely was precipitated by gastritis and medication noncompliance, his gap closed before transfer to our service but was still having difficulty with epigastric abdominal pain  and nausea limiting his PO intake. He was switched to his home lantus and transitioned to PO with IV fluids, PRN analgesics and antiemetics. He was discharged in good condition, tolerating PO intake fully and denying emesis or abdominal pain.  Discharge Vitals:   BP 160/80 mmHg  Pulse 113  Temp(Src) 98.4 F (36.9 C) (Oral)  Resp 19  Ht 5\' 8"  (1.727 m)  Wt 210 lb (95.255 kg)  BMI 31.94 kg/m2  SpO2 99%  Discharge Labs:  Results for orders placed or performed during the hospital encounter of 03/11/15 (from the past 24 hour(s))  Glucose, capillary     Status: Abnormal   Collection Time: 03/14/15  4:36 PM  Result Value Ref Range   Glucose-Capillary 288 (H) 65 - 99 mg/dL   Comment 1 Notify RN     Signed: Norval Gable, MD 03/15/2015, 12:33 PM

## 2015-03-17 ENCOUNTER — Other Ambulatory Visit: Payer: Self-pay | Admitting: Internal Medicine

## 2015-03-24 ENCOUNTER — Encounter: Payer: Self-pay | Admitting: Internal Medicine

## 2015-03-24 ENCOUNTER — Telehealth: Payer: Self-pay

## 2015-03-24 ENCOUNTER — Ambulatory Visit (INDEPENDENT_AMBULATORY_CARE_PROVIDER_SITE_OTHER): Payer: Medicaid Other | Admitting: Internal Medicine

## 2015-03-24 VITALS — BP 162/95 | HR 113 | Temp 98.9°F | Resp 18 | Ht 68.0 in | Wt 207.1 lb

## 2015-03-24 DIAGNOSIS — E118 Type 2 diabetes mellitus with unspecified complications: Secondary | ICD-10-CM

## 2015-03-24 DIAGNOSIS — Z794 Long term (current) use of insulin: Secondary | ICD-10-CM

## 2015-03-24 DIAGNOSIS — I10 Essential (primary) hypertension: Secondary | ICD-10-CM | POA: Diagnosis not present

## 2015-03-24 DIAGNOSIS — K5900 Constipation, unspecified: Secondary | ICD-10-CM | POA: Diagnosis not present

## 2015-03-24 DIAGNOSIS — IMO0002 Reserved for concepts with insufficient information to code with codable children: Secondary | ICD-10-CM

## 2015-03-24 DIAGNOSIS — E1165 Type 2 diabetes mellitus with hyperglycemia: Secondary | ICD-10-CM

## 2015-03-24 LAB — GLUCOSE, CAPILLARY: GLUCOSE-CAPILLARY: 232 mg/dL — AB (ref 65–99)

## 2015-03-24 MED ORDER — METFORMIN HCL 500 MG PO TABS: 1000.0000 mg | ORAL_TABLET | Freq: Every day | ORAL | Status: DC

## 2015-03-24 MED ORDER — POLYETHYLENE GLYCOL 3350 17 G PO PACK: 17.0000 g | PACK | Freq: Every day | ORAL | Status: DC | PRN

## 2015-03-24 MED ORDER — INSULIN GLARGINE 100 UNIT/ML ~~LOC~~ SOLN: 40.0000 [IU] | Freq: Every day | SUBCUTANEOUS | Status: DC

## 2015-03-24 NOTE — Patient Instructions (Signed)
Thank you for coming to see me today. It was a pleasure. Today we talked about:   - Your Diabetes Medications:  - Take Metformin 1000mg  once daily.  This is an increase from the 500mg  daily you have been taking.  Our goal is to have you increase to 1000mg  twice daily  - Change your dose of Lantus.  Take 22 units in the morning and take 18 units in the evening  - Continue to check your blood sugars regularly - Your Blood Pressure:  - Continue taking Lisinopril 20mg  daily  - Increase Amlodipine from 5 to 10mg  daily.  You were taking a half tablet.  Start taking a full tablet.  Please follow-up with [place] in 4 weeks.  If you have any questions or concerns, please do not hesitate to call the office at [phone].  Take Care,   Jule Ser, DO

## 2015-03-24 NOTE — Progress Notes (Signed)
Patient ID: [name], male   DOB: [birth year], 49 y.o.   MRN: [id number]   Subjective:   Patient ID: [name] male   DOB: 01-Aug-1965 49 y.o.   MRN: [id number]  HPI: [name] is a 49 y.o. as detailed below who presents to [facility] for hospital follow up of DKA.  Please see assessment and plan for status of patients chronic medical conditions.    Past Medical History  Diagnosis Date  . Onychomycosis   . Hypertension   . Diabetic peripheral neuropathy ([place])   . Diabetic foot ulcer ([place])     s/p Right first ray amputation, left transmetatarsal amputation with revision  . History of drug abuse     Cocaine and marijuana  . Exposure to trichomonas     treated empirically  . Cellulitis of left foot 03/2008    left 4th and 5th metatarsal area  . Hyperlipidemia   . 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 Mallory [name] tear April 2009  . History of prolonged Q-T interval on ECG   . 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  . 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 CKD (chronic kidney disease) stage 2, GFR 60-89 ml/min     BL SCr 1.3-1.4  . Depression   . Peripheral vascular disease ([place])   . Type 2 diabetes mellitus, uncontrolled, with renal complications ([place])   . GERD (gastroesophageal reflux disease)   . Hx of right BKA ([place]) 09/13/2012     Due to severe wound infection with sepsis  . 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 Cellulitis and abscess of leg 01/31/2014  . Osteomyelitis of left leg ([place]) 01/21/2014  . History of left below knee amputation ([place]) 01/21/2014    Performed on    . Status post below knee  amputation of right lower extremity ([place]) 03/23/2014   Current Outpatient Prescriptions  Medication Sig Dispense Refill  . ACCU-CHEK FASTCLIX LANCETS MISC 1 each by Does not apply route 4 (four) times daily -  before meals and at bedtime. 102 each 11  . amLODipine (NORVASC) 10 MG tablet Take 1 tablet (10 mg total) by mouth daily. 30 tablet 2  . docusate sodium (COLACE) 100 MG capsule Take 1 capsule (100 mg total) by mouth daily as needed. 30 capsule 1  . EASY COMFORT INSULIN SYRINGE 31G X 5/16" 1 ML MISC Use as directed daily for insulin injection.  99  . feeding supplement, GLUCERNA SHAKE, (GLUCERNA SHAKE) LIQD Take 237 mLs by mouth 2 (two) times daily between meals. 60 Can 3  . ferrous sulfate 325 (65 FE) MG tablet Take 1 tablet (325 mg total) by mouth daily with breakfast. 90 tablet 3  . FLUoxetine (PROZAC) 10 MG capsule Take 1 capsule (10 mg total) by mouth daily. 30 capsule 6  . folic acid (FOLVITE) 1 MG tablet Take 1 tablet (1 mg total) by mouth daily. 30 tablet 3  . gabapentin (NEURONTIN) 600 MG tablet Take 1 tablet (600 mg total) by mouth 3 (three) times daily. 90 tablet 6  . glucose blood (ACCU-CHEK SMARTVIEW) test strip  1 each by Other route 4 (four) times daily -  before meals and at bedtime. Use as instructed 100 each 12  . insulin aspart (NOVOLOG) 100 UNIT/ML injection Inject 5 Units into the skin 3 (three) times daily with meals. 10 mL 11  . insulin glargine (LANTUS) 100 UNIT/ML injection Inject 0.4 mLs (40 Units total) into the skin daily. Inject 22 units in the morning, and 18 units in the evening, 12 hours apart 10 mL 4  . LANTUS 100 UNIT/ML injection Inject 0.44 mLs (44 Units total) into the skin every morning. (1000 / 44units= 23days) 10 mL 13  . lisinopril (PRINIVIL,ZESTRIL) 20 MG tablet Take 1 tablet (20 mg total) by mouth daily. 30 tablet 1  . metFORMIN (GLUCOPHAGE) 500 MG tablet Take 2 tablets (1,000 mg total) by mouth daily with breakfast. 30 tablet 11  . pantoprazole  (PROTONIX) 40 MG tablet Take 1 tablet (40 mg total) by mouth 2 (two) times daily. 60 tablet 3  . polyethylene glycol (MIRALAX / GLYCOLAX) packet Take 17 g by mouth daily as needed. 14 each 3  . sildenafil (REVATIO) 20 MG tablet Take 2-5 tablets before sexual activity. 50 tablet 1  . sucralfate (CARAFATE) 1 GM/10ML suspension Take 10 mLs (1 g total) by mouth 4 (four) times daily -  with meals and at bedtime. 3600 mL 1   No current facility-administered medications for this visit.   Family History  Problem Relation Age of Onset  . Diabetes type II Mother   . Pancreatic cancer Father    Social History   Social History  . Marital Status: Married    Spouse Name: N/A  . Number of Children: N/A  . Years of Education: 12   Occupational History  . disabled    Social History Main Topics  . Smoking status: Current Every Day Smoker -- 2.00 packs/day for 34 years    Types: Cigarettes, Cigars  . Smokeless tobacco: Never Used     Comment: 1/2 PPD  . Alcohol Use: 0.0 oz/week    0 Standard drinks or equivalent per week     Comment: occ  . Drug Use: Yes    Special: Marijuana     Comment: NO LONGER USES-GRADUATING FROM GEO CRE AFTER  3-4 MONTHS                                                                                                                                       . Sexual Activity: Yes    Birth Control/ Protection: None   Other Topics Concern  . None   Social History Narrative   Review of Systems: Review of Systems  Constitutional: Negative for fever and chills.  HENT:       Complains of jaw tightness  Eyes: Negative for blurred vision.  Respiratory: Negative for cough and shortness of breath.   Cardiovascular: Negative for chest pain.  Gastrointestinal: Negative for  nausea, vomiting, abdominal pain, diarrhea and constipation.  Genitourinary: Negative for dysuria.  Musculoskeletal: Negative for myalgias and joint pain.  Skin: Negative for rash.  Neurological: Negative  for dizziness, loss of consciousness and headaches.  Psychiatric/Behavioral: Negative for depression.    Objective:  Physical Exam: Filed Vitals:   03/24/15 1415  BP: 162/95  Pulse: 113  Temp: 98.9 F (37.2 C)  TempSrc: Oral  Resp: 18  Height: 5\' 8"  (1.727 m)  Weight: 207 lb 1.6 oz (93.94 kg)  SpO2: 100%   Physical Exam  Constitutional: He is oriented to person, place, and time. He appears well-developed and well-nourished. No distress.  HENT:  Head: Normocephalic and atraumatic.  Eyes: EOM are normal.  Neck: Normal range of motion.  Cardiovascular: Regular rhythm and normal heart sounds.   Tachycardic  Pulmonary/Chest: Effort normal and breath sounds normal.  Abdominal: Soft. Bowel sounds are normal. There is no tenderness.  Musculoskeletal:  Bilateral knee amputations  Neurological: He is alert and oriented to person, place, and time. No cranial nerve deficit.  Skin: Skin is warm and dry.  Psychiatric: He has a normal mood and affect.    Assessment & Plan:  Please see problem list for assessment and plan.  Case discussed with Dr. Lynnae January

## 2015-03-24 NOTE — Assessment & Plan Note (Signed)
BP Readings from Last 3 Encounters:  03/24/15 162/95  03/14/15 160/80  02/01/15 146/98       Component Value Date/Time   NA 134* 03/14/2015 0824   K 3.7 03/14/2015 0824   CREATININE 1.27* 03/14/2015 0824   CREATININE 1.36* 10/09/2014 1442    Assessment:  Patient with HTN compliant with 2 class (CCB, ACEinhibitor) anti-hypertensive therapy who presents today with blood pressure of 162/95.  Denies headaches, chest pain, edema, dizziness or lightheadedness  Blood pressure control: not at goal Progress towards BP goal: deteriorated Comments: patient reports compliance and reliably told me which 2 BP medicines he takes  Plan:   -Medication changes: increase amlodipine to 10mg  daily  -Labs: none -Encourage home BP monitoring 3 times per week or at drug store occasionally  -Encourage regular exercise and healthy diet (decreased salt intake) -RTC in 4 weeks for DM and BP follow up.  If BP still elevated, would consider increasing Lisinopril or adding another agent  Medications after today's visit: 1. Lisinopril 20mg  daily 2. Amlodipine 10mg  daily

## 2015-03-24 NOTE — Telephone Encounter (Signed)
During visit patient asking if [name] can call him with information regarding who his [facility] representative is and how to get in touch with that group. Gave pone number that I located and advised that [name] could follow up when she gets back in office.  [name], can you get in touch with patient to make sure I gave him the correct phone number?  Thanks!

## 2015-03-24 NOTE — Progress Notes (Signed)
Internal Medicine Clinic Attending  I saw and evaluated the patient.  I personally confirmed the key portions of the history and exam documented by Dr. Juleen China and I reviewed pertinent patient test results.  The assessment, diagnosis, and plan were formulated together and I agree with the documentation in the resident's note. He c/o tense feeling around mouth and clenching teeth and speaking through teeth resulting in altered voice. He also thought his lip might have been enlarged one day when looked in mirror. No facial or tongue edema, slight asymmetric smile at reset, otherwise neuro exam nl. I do not know what to make of this - all electrolytes are nl. Follow for nl.

## 2015-03-24 NOTE — Assessment & Plan Note (Signed)
Lab Results  Component Value Date   HGBA1C 12.3 02/01/2015   HGBA1C 13.5* 09/13/2014   Assessment: Patient presents today for hospital follow up after episode of DKA.  Reports feeling better after discharge from the hospital.  He reports that he is taking Lantus 25 units QAM and 20 units QPM.  Also reports taking Metformin 500mg  daily despite last office visit note with PCP instructing to take 1000mg  daily.  Patient reports that he still uses his Novolog sliding scale if his CBG is >200 at which time he will take 5-8 units depending on the reading.  He is still only eating 1 meal per day due to affordability of groceries.  His blood sugars over the past month have been very labile, ranging from low 50s to 500s with an average of 224.  These lows have been both in the AM and PM and have been symptomatic with episodes of sweating, feeling jittery.  I am concerned regarding his risk for hypoglycemia.  Plan:  - Increase metformin to 1000mg  daily.  Explained to patient that our ultimate goal will be to increase to 1000mg  BID - Lantus changed from 25 units QAM, 20 units QPM to 22 units QAM, 18 units QPM for a total daily reduction of 5 units - Reiterated PCP recommendation regarding Novolog - Consideration should be given to adding 2nd oral agent from the GLP-1 agonist or SGLT-2 antagonist groups.  Would avoid sulfonylurea in this patient due to risk of hypoglycemia - RTC in 4 weeks.  Adjust metformin at that time if patient has been compliant with new dose and no side effects.  Consider alterations to Lantus if needed - Patient reminded to bring glucometer to every appointment

## 2015-03-26 ENCOUNTER — Other Ambulatory Visit: Payer: Self-pay | Admitting: *Deleted

## 2015-03-26 DIAGNOSIS — T730XXS Starvation, sequela: Secondary | ICD-10-CM

## 2015-03-29 ENCOUNTER — Encounter: Payer: Self-pay | Admitting: Licensed Clinical Social Worker

## 2015-03-29 NOTE — Telephone Encounter (Signed)
Pt is pending case management status with [facility] with Laverle Hobby RN with [facility] [phone].  CSW will request Ms. Barnes to contact Mr. Skiver.

## 2015-03-29 NOTE — Progress Notes (Addendum)
Patient ID: [name], male   DOB: 09/07/1965, 49 y.o.   MRN: [id number] Response from [place],   I have left him several messages, I will reach out to him again today.   Thank you  Larene Beach   Contact information placed on Snapshot.

## 2015-03-29 NOTE — Progress Notes (Signed)
Patient ID: [name], male   DOB: Apr 21, 1965, 49 y.o.   MRN: [id number]  CSW received note of [name] calling [facility] to obtain contact information for his [place].  CSW utilized [facility] to obtain to whom pt has been assigned.  Currently pt is pending care management with Laverle Hobby RN with [facility].  CSW sent CMIS message to Ms. Barnes requesting outreach to [name].

## 2015-06-01 ENCOUNTER — Inpatient Hospital Stay (HOSPITAL_COMMUNITY)
Admission: EM | Admit: 2015-06-01 | Discharge: 2015-06-03 | DRG: 638 | Disposition: A | Discharge status: Home / Self Care | Payer: Medicaid Other | Attending: Student in an Organized Health Care Education/Training Program | Admitting: Student in an Organized Health Care Education/Training Program

## 2015-06-01 ENCOUNTER — Encounter (HOSPITAL_COMMUNITY): Payer: Self-pay | Admitting: Emergency Medicine

## 2015-06-01 DIAGNOSIS — F17210 Nicotine dependence, cigarettes, uncomplicated: Secondary | ICD-10-CM | POA: Diagnosis present

## 2015-06-01 DIAGNOSIS — F1910 Other psychoactive substance abuse, uncomplicated: Secondary | ICD-10-CM | POA: Diagnosis present

## 2015-06-01 DIAGNOSIS — K219 Gastro-esophageal reflux disease without esophagitis: Secondary | ICD-10-CM | POA: Diagnosis present

## 2015-06-01 DIAGNOSIS — I129 Hypertensive chronic kidney disease with stage 1 through stage 4 chronic kidney disease, or unspecified chronic kidney disease: Secondary | ICD-10-CM | POA: Diagnosis present

## 2015-06-01 DIAGNOSIS — Z79899 Other long term (current) drug therapy: Secondary | ICD-10-CM | POA: Diagnosis not present

## 2015-06-01 DIAGNOSIS — Z9114 Patient's other noncompliance with medication regimen: Secondary | ICD-10-CM | POA: Diagnosis not present

## 2015-06-01 DIAGNOSIS — D509 Iron deficiency anemia, unspecified: Secondary | ICD-10-CM | POA: Diagnosis present

## 2015-06-01 DIAGNOSIS — I2510 Atherosclerotic heart disease of native coronary artery without angina pectoris: Secondary | ICD-10-CM | POA: Diagnosis present

## 2015-06-01 DIAGNOSIS — R748 Abnormal levels of other serum enzymes: Secondary | ICD-10-CM | POA: Diagnosis present

## 2015-06-01 DIAGNOSIS — N182 Chronic kidney disease, stage 2 (mild): Secondary | ICD-10-CM | POA: Diagnosis present

## 2015-06-01 DIAGNOSIS — G546 Phantom limb syndrome with pain: Secondary | ICD-10-CM | POA: Diagnosis present

## 2015-06-01 DIAGNOSIS — F1410 Cocaine abuse, uncomplicated: Secondary | ICD-10-CM | POA: Diagnosis present

## 2015-06-01 DIAGNOSIS — E1310 Other specified diabetes mellitus with ketoacidosis without coma: Secondary | ICD-10-CM | POA: Diagnosis present

## 2015-06-01 DIAGNOSIS — E1122 Type 2 diabetes mellitus with diabetic chronic kidney disease: Secondary | ICD-10-CM | POA: Diagnosis present

## 2015-06-01 DIAGNOSIS — Z89511 Acquired absence of right leg below knee: Secondary | ICD-10-CM

## 2015-06-01 DIAGNOSIS — E118 Type 2 diabetes mellitus with unspecified complications: Secondary | ICD-10-CM

## 2015-06-01 DIAGNOSIS — Z905 Acquired absence of kidney: Secondary | ICD-10-CM

## 2015-06-01 DIAGNOSIS — I10 Essential (primary) hypertension: Secondary | ICD-10-CM | POA: Diagnosis present

## 2015-06-01 DIAGNOSIS — F329 Major depressive disorder, single episode, unspecified: Secondary | ICD-10-CM | POA: Diagnosis present

## 2015-06-01 DIAGNOSIS — E1142 Type 2 diabetes mellitus with diabetic polyneuropathy: Secondary | ICD-10-CM | POA: Diagnosis present

## 2015-06-01 DIAGNOSIS — E0810 Diabetes mellitus due to underlying condition with ketoacidosis without coma: Secondary | ICD-10-CM

## 2015-06-01 DIAGNOSIS — Z89512 Acquired absence of left leg below knee: Secondary | ICD-10-CM | POA: Diagnosis not present

## 2015-06-01 DIAGNOSIS — N179 Acute kidney failure, unspecified: Secondary | ICD-10-CM | POA: Diagnosis present

## 2015-06-01 DIAGNOSIS — Z794 Long term (current) use of insulin: Secondary | ICD-10-CM

## 2015-06-01 DIAGNOSIS — F1210 Cannabis abuse, uncomplicated: Secondary | ICD-10-CM | POA: Diagnosis present

## 2015-06-01 DIAGNOSIS — IMO0002 Reserved for concepts with insufficient information to code with codable children: Secondary | ICD-10-CM | POA: Diagnosis present

## 2015-06-01 DIAGNOSIS — R7989 Other specified abnormal findings of blood chemistry: Secondary | ICD-10-CM

## 2015-06-01 DIAGNOSIS — R112 Nausea with vomiting, unspecified: Secondary | ICD-10-CM | POA: Diagnosis not present

## 2015-06-01 DIAGNOSIS — F14288 Cocaine dependence with other cocaine-induced disorder: Secondary | ICD-10-CM | POA: Diagnosis not present

## 2015-06-01 DIAGNOSIS — E1110 Type 2 diabetes mellitus with ketoacidosis without coma: Secondary | ICD-10-CM | POA: Diagnosis present

## 2015-06-01 DIAGNOSIS — E785 Hyperlipidemia, unspecified: Secondary | ICD-10-CM | POA: Diagnosis present

## 2015-06-01 DIAGNOSIS — E1165 Type 2 diabetes mellitus with hyperglycemia: Secondary | ICD-10-CM

## 2015-06-01 DIAGNOSIS — N189 Chronic kidney disease, unspecified: Secondary | ICD-10-CM | POA: Diagnosis present

## 2015-06-01 DIAGNOSIS — F32A Depression, unspecified: Secondary | ICD-10-CM | POA: Diagnosis present

## 2015-06-01 LAB — CBG MONITORING, ED
GLUCOSE-CAPILLARY: 561 mg/dL — AB (ref 65–99)
GLUCOSE-CAPILLARY: 400 mg/dL — AB (ref 65–99)
GLUCOSE-CAPILLARY: 377 mg/dL — AB (ref 65–99)
GLUCOSE-CAPILLARY: 281 mg/dL — AB (ref 65–99)
GLUCOSE-CAPILLARY: 278 mg/dL — AB (ref 65–99)
GLUCOSE-CAPILLARY: 154 mg/dL — AB (ref 65–99)
GLUCOSE-CAPILLARY: 124 mg/dL — AB (ref 65–99)
GLUCOSE-CAPILLARY: 184 mg/dL — AB (ref 65–99)
Glucose-Capillary: 477 mg/dL — ABNORMAL HIGH (ref 65–99)
Glucose-Capillary: 335 mg/dL — ABNORMAL HIGH (ref 65–99)
Glucose-Capillary: 113 mg/dL — ABNORMAL HIGH (ref 65–99)
Glucose-Capillary: 157 mg/dL — ABNORMAL HIGH (ref 65–99)
Glucose-Capillary: 165 mg/dL — ABNORMAL HIGH (ref 65–99)
Glucose-Capillary: 124 mg/dL — ABNORMAL HIGH (ref 65–99)

## 2015-06-01 LAB — BASIC METABOLIC PANEL
ANION GAP: 23 — AB (ref 5–15)
ANION GAP: 11 (ref 5–15)
ANION GAP: 12 (ref 5–15)
Anion gap: 17 — ABNORMAL HIGH (ref 5–15)
Anion gap: 11 (ref 5–15)
BUN: 54 mg/dL — AB (ref 6–20)
BUN: 49 mg/dL — AB (ref 6–20)
BUN: 39 mg/dL — AB (ref 6–20)
BUN: 30 mg/dL — AB (ref 6–20)
BUN: 28 mg/dL — AB (ref 6–20)
CALCIUM: 8.7 mg/dL — AB (ref 8.9–10.3)
CHLORIDE: 102 mmol/L (ref 101–111)
CHLORIDE: 108 mmol/L (ref 101–111)
CHLORIDE: 107 mmol/L (ref 101–111)
CHLORIDE: 107 mmol/L (ref 101–111)
CO2: 10 mmol/L — AB (ref 22–32)
CO2: 15 mmol/L — AB (ref 22–32)
CO2: 19 mmol/L — ABNORMAL LOW (ref 22–32)
CO2: 20 mmol/L — ABNORMAL LOW (ref 22–32)
CO2: 19 mmol/L — ABNORMAL LOW (ref 22–32)
Calcium: 8.1 mg/dL — ABNORMAL LOW (ref 8.9–10.3)
Calcium: 8.2 mg/dL — ABNORMAL LOW (ref 8.9–10.3)
Calcium: 8.2 mg/dL — ABNORMAL LOW (ref 8.9–10.3)
Calcium: 8.2 mg/dL — ABNORMAL LOW (ref 8.9–10.3)
Chloride: 93 mmol/L — ABNORMAL LOW (ref 101–111)
Creatinine, Ser: 2.43 mg/dL — ABNORMAL HIGH (ref 0.61–1.24)
Creatinine, Ser: 2.16 mg/dL — ABNORMAL HIGH (ref 0.61–1.24)
Creatinine, Ser: 1.74 mg/dL — ABNORMAL HIGH (ref 0.61–1.24)
Creatinine, Ser: 1.54 mg/dL — ABNORMAL HIGH (ref 0.61–1.24)
Creatinine, Ser: 1.57 mg/dL — ABNORMAL HIGH (ref 0.61–1.24)
GFR calc Af Amer: 34 mL/min — ABNORMAL LOW (ref >60)
GFR calc Af Amer: 40 mL/min — ABNORMAL LOW (ref >60)
GFR calc Af Amer: 51 mL/min — ABNORMAL LOW (ref >60)
GFR calc Af Amer: 60 mL/min — ABNORMAL LOW (ref >60)
GFR calc Af Amer: 58 mL/min — ABNORMAL LOW (ref >60)
GFR calc non Af Amer: 34 mL/min — ABNORMAL LOW (ref >60)
GFR calc non Af Amer: 44 mL/min — ABNORMAL LOW (ref >60)
GFR calc non Af Amer: 51 mL/min — ABNORMAL LOW (ref >60)
GFR calc non Af Amer: 50 mL/min — ABNORMAL LOW (ref >60)
GFR, EST NON AFRICAN AMERICAN: 30 mL/min — AB (ref >60)
GLUCOSE: 569 mg/dL — AB (ref 65–99)
GLUCOSE: 372 mg/dL — AB (ref 65–99)
GLUCOSE: 143 mg/dL — AB (ref 65–99)
GLUCOSE: 150 mg/dL — AB (ref 65–99)
GLUCOSE: 172 mg/dL — AB (ref 65–99)
POTASSIUM: 5.5 mmol/L — AB (ref 3.5–5.1)
POTASSIUM: 4.5 mmol/L (ref 3.5–5.1)
POTASSIUM: 4.3 mmol/L (ref 3.5–5.1)
POTASSIUM: 4.5 mmol/L (ref 3.5–5.1)
POTASSIUM: 4.6 mmol/L (ref 3.5–5.1)
SODIUM: 138 mmol/L (ref 135–145)
Sodium: 126 mmol/L — ABNORMAL LOW (ref 135–145)
Sodium: 134 mmol/L — ABNORMAL LOW (ref 135–145)
Sodium: 138 mmol/L (ref 135–145)
Sodium: 138 mmol/L (ref 135–145)

## 2015-06-01 LAB — CBC
HEMATOCRIT: 28.4 % — AB (ref 39.0–52.0)
HEMOGLOBIN: 9.6 g/dL — AB (ref 13.0–17.0)
MCH: 28.7 pg (ref 26.0–34.0)
MCHC: 33.8 g/dL (ref 30.0–36.0)
MCV: 84.8 fL (ref 78.0–100.0)
Platelets: 388 10*3/uL (ref 150–400)
RBC: 3.35 MIL/uL — AB (ref 4.22–5.81)
RDW: 13.5 % (ref 11.5–15.5)
WBC: 11.3 10*3/uL — ABNORMAL HIGH (ref 4.0–10.5)

## 2015-06-01 LAB — TROPONIN I
TROPONIN I: 0.54 ng/mL — AB (ref <0.031)
TROPONIN I: 0.49 ng/mL — AB (ref <0.031)
Troponin I: 0.54 ng/mL (ref <0.031)

## 2015-06-01 LAB — URINALYSIS, ROUTINE W REFLEX MICROSCOPIC
BILIRUBIN URINE: NEGATIVE
Glucose, UA: >1000 mg/dL — AB
Ketones, ur: 40 mg/dL — AB
Leukocytes, UA: NEGATIVE
NITRITE: NEGATIVE
PH: 5 (ref 5.0–8.0)
Protein, ur: NEGATIVE mg/dL
SPECIFIC GRAVITY, URINE: 1.022 (ref 1.005–1.030)

## 2015-06-01 LAB — I-STAT VENOUS BLOOD GAS, ED
Acid-base deficit: 15 mmol/L — ABNORMAL HIGH (ref 0.0–2.0)
Bicarbonate: 10.7 mEq/L — ABNORMAL LOW (ref 20.0–24.0)
O2 SAT: 52 %
PCO2 VEN: 24.2 mmHg — AB (ref 45.0–50.0)
PO2 VEN: 31 mmHg (ref 30.0–45.0)
Patient temperature: 37
TCO2: 11 mmol/L (ref 0–100)
pH, Ven: 7.253 (ref 7.250–7.300)

## 2015-06-01 LAB — URINE MICROSCOPIC-ADD ON: WBC, UA: NONE SEEN WBC/hpf (ref 0–5)

## 2015-06-01 LAB — RAPID URINE DRUG SCREEN, HOSP PERFORMED
Amphetamines: NOT DETECTED
BARBITURATES: NOT DETECTED
BENZODIAZEPINES: NOT DETECTED
Cocaine: POSITIVE — AB
Opiates: NOT DETECTED
Tetrahydrocannabinol: POSITIVE — AB

## 2015-06-01 LAB — ETHANOL

## 2015-06-01 MED ORDER — ONDANSETRON HCL 4 MG/2ML IJ SOLN
4.0000 mg | Freq: Once | INTRAMUSCULAR | Status: AC
  Administered 2015-06-01: 4 mg via INTRAVENOUS
  Filled 2015-06-01: qty 2

## 2015-06-01 MED ORDER — ENOXAPARIN SODIUM 40 MG/0.4ML ~~LOC~~ SOLN
40.0000 mg | SUBCUTANEOUS | Status: DC
  Administered 2015-06-02: 40 mg via SUBCUTANEOUS
  Filled 2015-06-01: qty 0.4

## 2015-06-01 MED ORDER — SODIUM CHLORIDE 0.9 % IV SOLN: INTRAVENOUS | Status: DC

## 2015-06-01 MED ORDER — SODIUM CHLORIDE 0.9 % IV SOLN
INTRAVENOUS | Status: DC
  Administered 2015-06-01: 4.2 [IU]/h via INTRAVENOUS
  Filled 2015-06-01: qty 2.5

## 2015-06-01 MED ORDER — FERROUS SULFATE 325 (65 FE) MG PO TABS
325.0000 mg | ORAL_TABLET | Freq: Every day | ORAL | Status: DC
  Administered 2015-06-02 – 2015-06-03 (×2): 325 mg via ORAL
  Filled 2015-06-01 (×2): qty 1

## 2015-06-01 MED ORDER — FENTANYL CITRATE (PF) 100 MCG/2ML IJ SOLN
50.0000 ug | Freq: Once | INTRAMUSCULAR | Status: AC
  Administered 2015-06-01: 50 ug via INTRAVENOUS
  Filled 2015-06-01: qty 2

## 2015-06-01 MED ORDER — GLUCERNA SHAKE PO LIQD
237.0000 mL | Freq: Two times a day (BID) | ORAL | Status: DC
  Administered 2015-06-02 – 2015-06-03 (×3): 237 mL via ORAL
  Filled 2015-06-01: qty 237

## 2015-06-01 MED ORDER — SODIUM CHLORIDE 0.9 % IV BOLUS (SEPSIS)
1000.0000 mL | Freq: Once | INTRAVENOUS | Status: AC
  Administered 2015-06-01: 1000 mL via INTRAVENOUS

## 2015-06-01 MED ORDER — SODIUM CHLORIDE 0.9 % IV SOLN: INTRAVENOUS | Status: AC

## 2015-06-01 MED ORDER — INSULIN GLARGINE 100 UNIT/ML ~~LOC~~ SOLN
20.0000 [IU] | Freq: Every day | SUBCUTANEOUS | Status: DC
  Administered 2015-06-01: 20 [IU] via SUBCUTANEOUS
  Filled 2015-06-01 (×2): qty 0.2

## 2015-06-01 MED ORDER — POLYETHYLENE GLYCOL 3350 17 G PO PACK: 17.0000 g | PACK | Freq: Every day | ORAL | Status: DC | PRN

## 2015-06-01 MED ORDER — GABAPENTIN 300 MG PO CAPS
600.0000 mg | ORAL_CAPSULE | Freq: Three times a day (TID) | ORAL | Status: DC
  Administered 2015-06-01 – 2015-06-03 (×6): 600 mg via ORAL
  Filled 2015-06-01 (×6): qty 2

## 2015-06-01 MED ORDER — FOLIC ACID 1 MG PO TABS
1.0000 mg | ORAL_TABLET | Freq: Every day | ORAL | Status: DC
  Administered 2015-06-02 – 2015-06-03 (×2): 1 mg via ORAL
  Filled 2015-06-01 (×2): qty 1

## 2015-06-01 MED ORDER — FLUOXETINE HCL 10 MG PO CAPS
10.0000 mg | ORAL_CAPSULE | Freq: Every day | ORAL | Status: DC
  Administered 2015-06-02 – 2015-06-03 (×2): 10 mg via ORAL
  Filled 2015-06-01 (×2): qty 1

## 2015-06-01 MED ORDER — DEXTROSE-NACL 5-0.45 % IV SOLN: INTRAVENOUS | Status: DC

## 2015-06-01 MED ORDER — AMLODIPINE BESYLATE 10 MG PO TABS
10.0000 mg | ORAL_TABLET | Freq: Every day | ORAL | Status: DC
  Administered 2015-06-02 – 2015-06-03 (×2): 10 mg via ORAL
  Filled 2015-06-01 (×2): qty 1

## 2015-06-01 MED ORDER — DOCUSATE SODIUM 100 MG PO CAPS
100.0000 mg | ORAL_CAPSULE | Freq: Every day | ORAL | Status: DC | PRN
  Administered 2015-06-02: 100 mg via ORAL
  Filled 2015-06-01: qty 1

## 2015-06-01 MED ORDER — PANTOPRAZOLE SODIUM 40 MG PO TBEC
40.0000 mg | DELAYED_RELEASE_TABLET | Freq: Every day | ORAL | Status: DC
  Administered 2015-06-02 – 2015-06-03 (×2): 40 mg via ORAL
  Filled 2015-06-01 (×2): qty 1

## 2015-06-01 MED ORDER — DEXTROSE-NACL 5-0.45 % IV SOLN
INTRAVENOUS | Status: DC
  Administered 2015-06-01: 14:00:00 via INTRAVENOUS

## 2015-06-01 MED ORDER — INSULIN ASPART 100 UNIT/ML ~~LOC~~ SOLN
0.0000 [IU] | Freq: Three times a day (TID) | SUBCUTANEOUS | Status: DC
Start: 2015-06-02 — End: 2015-06-03
  Administered 2015-06-02: 8 [IU] via SUBCUTANEOUS
  Administered 2015-06-02: 11 [IU] via SUBCUTANEOUS
  Administered 2015-06-02: 8 [IU] via SUBCUTANEOUS
  Administered 2015-06-03: 3 [IU] via SUBCUTANEOUS
  Administered 2015-06-03: 11 [IU] via SUBCUTANEOUS

## 2015-06-01 MED ORDER — SODIUM CHLORIDE 0.9 % IV SOLN
INTRAVENOUS | Status: DC
  Administered 2015-06-02 (×2): via INTRAVENOUS

## 2015-06-01 NOTE — ED Notes (Signed)
Pt cussing at this RN, stating he is frustrated that no one has brought him ice chips. Explained this RN needed to speak with admitting team. Pt verbalized understanding.

## 2015-06-01 NOTE — ED Notes (Signed)
Pt cbg 287 

## 2015-06-01 NOTE — ED Notes (Addendum)
Pt arrived to ED by [facility]. Reported elevated CBG 584. Patient alert and drowsy. C/o nausea and vomiting per EMS. Vitals stable.

## 2015-06-01 NOTE — ED Notes (Signed)
Attempted report 

## 2015-06-01 NOTE — ED Notes (Signed)
IV team at bedside to obtain labs and start second IV

## 2015-06-01 NOTE — H&P (Signed)
Date: 06/01/2015               Patient Name:  [name] MRN: [id number]  DOB: Nov 27, 1965 Age / Sex: 50 y.o., male   PCP: Wesley Estelle, MD         Medical Service: Internal Medicine Teaching Service         Attending Physician: Wesley. Axel Filler, MD    First Contact: Wesley. Blane [name] Pager: [phone]  Second Contact: Wesley. Charlott [name] Pager: [phone]       After Hours (After 5p/  First Contact Pager: [phone]  weekends / holidays): Second Contact Pager: [phone]   Chief Complaint: nausea, vomiting and elevated blood sugar  History of Present Illness: [name] is a 50 year old male with PMH of IDDM, HTN and polysubstance abuse here with c/o N/V and abdominal pain x 1 week.  Symptoms started last Wednesday after drinking 1/2 pint of wine on Tuesday night.  He normally drinks 1/2 pint once weekly and says there was nothing unusual about the amount or type of EtOH he drank last Tuesday but he attributes symptoms to this.  Associated symptoms include chills and generalized weakness.  He denies new foods or sick contacts.  Since he had decreased po and was vomiting (2-3x daily) he stopped taking his insulin last Wednesday.  He is still vomiting 2-3x daily.  He says initially vomit was red x 2 days but has been "clear" since then.  He denies diarrhea, chest pain, dyspnea or cough.  He admits to daily THC use and weekly cocaine (smokes) use.     Meds: Current Facility-Administered Medications  Medication Dose Route Frequency Provider Last Rate Last Dose  . 0.9 %  sodium chloride infusion   Intravenous Continuous Wesley N Ward, DO      . dextrose 5 %-0.45 % sodium chloride infusion   Intravenous Continuous Wesley N Ward, DO      . insulin regular (NOVOLIN R,HUMULIN R) 250 Units in sodium chloride 0.9 % 250 mL (1 Units/mL) infusion   Intravenous Continuous Wesley N Ward, DO 4.2 mL/hr at 06/01/15 0650 4.2 Units/hr at 06/01/15 0650  . sodium chloride 0.9 % bolus 1,000 mL  1,000 mL  Intravenous Once Wesley Bison Ward, DO       Current Outpatient Prescriptions  Medication Sig Dispense Refill  . amLODipine (NORVASC) 10 MG tablet Take 1 tablet (10 mg total) by mouth daily. 30 tablet 2  . feeding supplement, GLUCERNA SHAKE, (GLUCERNA SHAKE) LIQD Take 237 mLs by mouth 2 (two) times daily between meals. 60 Can 3  . ferrous sulfate 325 (65 FE) MG tablet Take 1 tablet (325 mg total) by mouth daily with breakfast. 90 tablet 3  . FLUoxetine (PROZAC) 10 MG capsule Take 1 capsule (10 mg total) by mouth daily. 30 capsule 6  . gabapentin (NEURONTIN) 600 MG tablet Take 1 tablet (600 mg total) by mouth 3 (three) times daily. 90 tablet 6  . ACCU-CHEK FASTCLIX LANCETS MISC 1 each by Does not apply route 4 (four) times daily -  before meals and at bedtime. 102 each 11  . docusate sodium (COLACE) 100 MG capsule Take 1 capsule (100 mg total) by mouth daily as needed. 30 capsule 1  . EASY COMFORT INSULIN SYRINGE 31G X 5/16" 1 ML MISC Use as directed daily for insulin injection.  99  . folic acid (FOLVITE) 1 MG tablet Take 1 tablet (1 mg total) by mouth daily. 30 tablet 3  .  glucose blood (ACCU-CHEK SMARTVIEW) test strip 1 each by Other route 4 (four) times daily -  before meals and at bedtime. Use as instructed 100 each 12  . insulin aspart (NOVOLOG) 100 UNIT/ML injection Inject 5 Units into the skin 3 (three) times daily with meals. (Patient taking differently: Inject 15-18 Units into the skin 3 (three) times daily with meals. ) 10 mL 11  . insulin glargine (LANTUS) 100 UNIT/ML injection Inject 0.4 mLs (40 Units total) into the skin daily. Inject 22 units in the morning, and 18 units in the evening, 12 hours apart 10 mL 4  . LANTUS 100 UNIT/ML injection Inject 0.44 mLs (44 Units total) into the skin every morning. (1000 / 44units= 23days) 10 mL 13  . lisinopril (PRINIVIL,ZESTRIL) 20 MG tablet Take 1 tablet (20 mg total) by mouth daily. 30 tablet 1  . metFORMIN (GLUCOPHAGE) 500 MG tablet Take 2  tablets (1,000 mg total) by mouth daily with breakfast. 30 tablet 11  . pantoprazole (PROTONIX) 40 MG tablet Take 1 tablet (40 mg total) by mouth 2 (two) times daily. 60 tablet 3  . polyethylene glycol (MIRALAX / GLYCOLAX) packet Take 17 g by mouth daily as needed. 14 each 3  . sildenafil (REVATIO) 20 MG tablet Take 2-5 tablets before sexual activity. 50 tablet 1  . sucralfate (CARAFATE) 1 GM/10ML suspension Take 10 mLs (1 g total) by mouth 4 (four) times daily -  with meals and at bedtime. 3600 mL 1    Allergies: Allergies as of 06/01/2015  . (No Known Allergies)   Past Medical History  Diagnosis Date  . Onychomycosis   . Hypertension   . Diabetic peripheral neuropathy ([place])   . Diabetic foot ulcer ([place])     s/p Right first ray amputation, left transmetatarsal amputation with revision  . History of drug abuse     Cocaine and marijuana  . Exposure to trichomonas     treated empirically  . Cellulitis of left foot 03/2008    left 4th and 5th metatarsal area  . Hyperlipidemia   . 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 Mallory [name] tear April 2009  . History of prolonged Q-T interval on ECG   . 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  . 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 CKD (chronic kidney disease) stage 2, GFR 60-89 ml/min     BL SCr 1.3-1.4  . Depression   . Peripheral vascular disease ([place])   . Type 2 diabetes mellitus, uncontrolled, with renal complications ([place])   . GERD (gastroesophageal reflux disease)   . Hx of right BKA ([place]) 09/13/2012     Due to severe wound infection with sepsis  . Recurrent Foot Osteomyelitis 02/19/2012    Recurrent osteomyelitis of toes with multiple ray amputations Follows up with [name]   . S/P bilateral BKA (below knee amputation)  ([place]) 12/12/13    L BKA  . Cellulitis and abscess of leg 01/31/2014  . Osteomyelitis of left leg ([place]) 01/21/2014  . History of left below knee amputation ([place]) 01/21/2014    Performed on    . Status post below knee amputation of right lower extremity ([place]) 03/23/2014   Past Surgical History  Procedure Laterality Date  . Toe amputation  06/2011    left; 4th  toe  . Nephrectomy      L side  . I&d extremity  08/24/2011    Procedure: IRRIGATION AND DEBRIDEMENT EXTREMITY;  Surgeon: Meredith Pel, MD;  Location: [facility];  Service: Orthopedics;  Laterality: Left;  . Amputation  08/24/2011    Procedure: AMPUTATION RAY;  Surgeon: Meredith Pel, MD;  Location: [place];  Service: Orthopedics;  Laterality: Left;  left fourth toe Ray Resection  . Amputation  12/05/2011    Procedure: AMPUTATION RAY;  Surgeon: Meredith Pel, MD;  Location: [facility];  Service: Orthopedics;  Laterality: Left;  . I&d extremity  02/18/2012    Procedure: IRRIGATION AND DEBRIDEMENT EXTREMITY;  Surgeon: Mcarthur Rossetti, MD;  Location: [facility];  Service: Orthopedics;  Laterality: Left;  . Amputation  02/18/2012    Procedure: AMPUTATION DIGIT;  Surgeon: Mcarthur Rossetti, MD;  Location: [facility];  Service: Orthopedics;  Laterality: Left;  revison transmetatarsal  . I&d extremity Left 08/28/2012    Procedure: IRRIGATION AND DEBRIDEMENT EXTREMITY;  Surgeon: Linna Hoff, MD;  Location: [facility];  Service: Orthopedics;  Laterality: Left;  CYSTO TUBING/IRRIGATION, LEAD HAND.  . I&d extremity Left 08/30/2012    Procedure: IRRIGATION AND DEBRIDEMENT EXTREMITY;  Surgeon: Linna Hoff, MD;  Location: [place] [facility];  Service: Orthopedics;  Laterality: Left;  . Amputation Right 09/01/2012    Procedure: FOOT 1ST RAY AMPUTATION;  Surgeon: Newt Minion, MD;  Location: [facility];  Service: Orthopedics;  Laterality: Right;  . Amputation Left 09/01/2012    Procedure: FOOT REVISION TRANSMETATARSAL AMPUTATION ;  Surgeon: Newt Minion, MD;   Location: [facility];  Service: Orthopedics;  Laterality: Left;  . Amputation Right 09/13/2012    Procedure: AMPUTATION BELOW KNEE;  Surgeon: Newt Minion, MD;  Location: [facility];  Service: Orthopedics;  Laterality: Right;  Right Below Knee Amputation  . Amputation Left 12/12/2013    Procedure: Left Below Knee Amputation;  Surgeon: Newt Minion, MD;  Location: [facility];  Service: Orthopedics;  Laterality: Left;  . Stump revision Left 01/23/2014    Procedure: STUMP REVISION;  Surgeon: Newt Minion, MD;  Location: [facility];  Service: Orthopedics;  Laterality: Left;  . Esophagogastroduodenoscopy (egd) with propofol N/A 09/16/2014    Procedure: ESOPHAGOGASTRODUODENOSCOPY (EGD) WITH PROPOFOL;  Surgeon: Wonda Horner, MD;  Location: [facility];  Service: Endoscopy;  Laterality: N/A;   Family History  Problem Relation Age of Onset  . Diabetes type II Mother   . Pancreatic cancer Father    Social History   Social History  . Marital Status: Married    Spouse Name: N/A  . Number of Children: N/A  . Years of Education: 12   Occupational History  . disabled    Social History Main Topics  . Smoking status: Current Every Day Smoker -- 2.00 packs/day for 34 years    Types: Cigarettes, Cigars  . Smokeless tobacco: Never Used     Comment: 1/2 PPD  . Alcohol Use: 0.0 oz/week    0 Standard drinks or equivalent per week     Comment: occ  . Drug Use: Yes    Special: Marijuana     Comment: NO LONGER USES-GRADUATING FROM GEO CRE AFTER  3-4 MONTHS                                                                                                                                       .  Sexual Activity: Yes    Birth Control/ Protection: None   Other Topics Concern  . Not on file   Social History Narrative    Review of Systems: General:  reports generalized weakness and chills HEENT:  Reports sore throat since yesterday; denies headache, rhinorrhea, sneezing. Cards:  Denies chest pain or palpitations. Pulm:   Denies cough or dyspnea. GI:  Per HPI GU: reports decreased urination in past few days; denies difficult urination, dysuria or hematuria.  Physical Exam: Blood pressure 123/78, pulse 108, temperature 98 F (36.7 C), temperature source Oral, resp. rate 19, SpO2 100 %. General: sitting up in bed in NAD HEENT: PERRL, EOMI, no scleral icterus, MMM, mildly erythematous pharynx without exudate, no cervical lymphadenopathy Cardiac: regular, tachycardic, no rubs, murmurs or gallops Pulm: clear to auscultation bilaterally, moving normal volumes of air Abd: soft, nondistended, BS present, mild epigastric TTP Ext: B/L BKA stump with intact skin Neuro: alert and oriented X3, cranial nerves II-XII grossly intact, responding appropriately, moving upper extremities sponataneously  Lab results: Basic Metabolic Panel:  Recent Labs  06/01/15 0510  NA 126*  K 5.5*  CL 93*  CO2 10*  GLUCOSE 569*  BUN 54*  CREATININE 2.43*  CALCIUM 8.7*   CBC:  Recent Labs  06/01/15 0510  WBC 11.3*  HGB 9.6*  HCT 28.4*  MCV 84.8  PLT 388   Cardiac Enzymes:  Recent Labs  06/01/15 0935  TROPONINI 0.54*   CBG:  Recent Labs  06/01/15 0450 06/01/15 0646  GLUCAP 561* 477*   Urine Drug Screen: Drugs of Abuse     Component Value Date/Time   LABOPIA POSITIVE* 10/27/2014 0452   COCAINSCRNUR NONE DETECTED 10/27/2014 0452   COCAINSCRNUR NEG 09/22/2008 2125   LABBENZ NONE DETECTED 10/27/2014 0452   LABBENZ NEG 09/22/2008 2125   AMPHETMU NONE DETECTED 10/27/2014 0452   AMPHETMU NEG 09/22/2008 2125   THCU POSITIVE* 10/27/2014 0452   LABBARB NONE DETECTED 10/27/2014 0452    Urinalysis:  Recent Labs  06/01/15 0602  COLORURINE YELLOW  LABSPEC 1.022  PHURINE 5.0  GLUCOSEU >1000*  HGBUR TRACE*  BILIRUBINUR NEGATIVE  KETONESUR 40*  PROTEINUR NEGATIVE  NITRITE NEGATIVE  LEUKOCYTESUR NEGATIVE    Other results: EKG:  Sinus tachycardia, 115bpm, QT prolonged 495; similar to prior  EKG  Assessment & Plan by Problem: 50 year old male with PMH of IDDM, HTN and polysubstance abuse here with c/o N/V, epigastric pain in DKA.  DKA:  Bicarb 10 with ketonuria coupled with IDDM and symptoms c/w DKA.  Appears to be due to non-compliance with insulin since he stopped insulin 1 week ago.  No S&S of infection.  Hemodynamically stable.  He denies CP and EKG reassuring however troponin elevated at 0.54.  This may be due to decreased clearance in the setting of AKI on CKD vs demand ischemia.  - close monitoring in SDU - continue IV fluids; start D5-1/2NS once blood glucose < 250. - continue insulin gtt until AG closed x 2 then convert to SQ insulin and continue gtt for one hour after SQ insulin [name] - monitor BMP q4h, keep K 4-5 - NPO until DKA resolved - trend troponin, monitor on telemetry, AM EKG  IDDM:  Poorly controlled, A1c 12.3 in Oct 2016.  He was just admitted for DKA two months ago.  He reports compliance with insulin prior to last week. - DM educator consult - treating DKA as above followed by transition to SQ insulin  AKI on CKD2:  Baseline Cr  is 1.2-1.5.  Cr is 2.43 on admission.  He feels he has not been urinating as much and attributes this to decreased po.  This is likely pre-renal in the setting of vomiting and decreased po. - monitor BMP q4h as above - continue IV fluids as above  HTN:  Stable.  - continue amlodipine - hold ACEI in the setting of hypovolemia and AKI  Phantom limb pain s/p B/L BKA:  conitnue gabapentin.  Polysubstance abuse:  Tobacco, cocaine, THC use.  Drinks 1/2 pint of wine once weekly. - advise cessation - social work consult if patient agrees - check EtOH level; he has not had a drink in over 1 week and unlikely to be at risk of w/d  Diet:  NPO while in DKA, then carb mod VTE ppx: [place] Lovenox Code: Full  Dispo: Disposition is deferred at this time, awaiting improvement of current medical problems. Anticipated discharge in approximately  1-2 day(s).   The patient does have a current PCP Wesley Estelle, MD) and does need an [facility] hospital follow-up appointment after discharge.  The patient does not have transportation limitations that hinder transportation to clinic appointments.  Signed: Francesca Oman, DO 06/01/2015, 7:05 AM

## 2015-06-01 NOTE — ED Notes (Signed)
Ok to give pt ice chips per admitting

## 2015-06-01 NOTE — Progress Notes (Signed)
Called back for report. RN not available. Will follow up.

## 2015-06-01 NOTE — ED Notes (Signed)
Spoke with admitting provider, pt off insulin drip and not appropriate for step down, pt to receive new bed order.

## 2015-06-01 NOTE — ED Provider Notes (Signed)
TIME SEEN: 5:30 AM    CHIEF COMPLAINT: Hyperglycemia, vomiting  HPI: Pt is a 50 y.o. male with history of hypertension, insulin-dependent diabetes, substance abuse, chronic kidney disease, peripheral vascular disease who presents the emergency department with hyperglycemia. States he has not been taking his insulin for the past 4-5 days. Unclear why he is not taking his medications. States he has had vomiting but no diarrhea. Complaining of diffuse abdominal pain. States he thinks he has had DKA before and feels the same. No fevers or chills. No cough. No chest pain or shortness of breath.  ROS: See HPI Constitutional: no fever  Eyes: no drainage  ENT: no runny nose   Cardiovascular:  no chest pain  Resp: no SOB  GI: no vomiting GU: no dysuria Integumentary: no rash  Allergy: no hives  Musculoskeletal: no leg swelling  Neurological: no slurred speech ROS otherwise negative  PAST MEDICAL HISTORY/PAST SURGICAL HISTORY:  Past Medical History  Diagnosis Date  . Onychomycosis   . Hypertension   . Diabetic peripheral neuropathy ([place])   . Diabetic foot ulcer ([place])     s/p Right first ray amputation, left transmetatarsal amputation with revision  . History of drug abuse     Cocaine and marijuana  . Exposure to trichomonas     treated empirically  . Cellulitis of left foot 03/2008    left 4th and 5th metatarsal area  . Hyperlipidemia   . 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 Mallory [name] tear April 2009  . History of prolonged Q-T interval on ECG   . 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  . 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 CKD (chronic kidney disease) stage 2, GFR 60-89 ml/min     BL SCr 1.3-1.4  . Depression   . Peripheral vascular disease ([place])   . Type 2  diabetes mellitus, uncontrolled, with renal complications ([place])   . GERD (gastroesophageal reflux disease)   . Hx of right BKA ([place]) 09/13/2012     Due to severe wound infection with sepsis  . 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 S/P bilateral BKA (below knee amputation) ([place]) 12/12/13    L BKA  . Cellulitis and abscess of leg 01/31/2014  . Osteomyelitis of left leg ([place]) 01/21/2014  . History of left below knee amputation ([place]) 01/21/2014    Performed on    . Status post below knee amputation of right lower extremity ([place]) 03/23/2014    MEDICATIONS:  Prior to Admission medications   Medication Sig Start Date End Date Taking? Authorizing Provider  ACCU-CHEK FASTCLIX LANCETS MISC 1 each by Does not apply route 4 (four) times daily -  before meals and at bedtime. 03/23/14   Alexa Sherral Hammers, MD  amLODipine (NORVASC) 10 MG tablet Take 1 tablet (10 mg total) by mouth daily. 03/14/15   Milagros Loll, MD  docusate sodium (COLACE) 100 MG capsule Take 1 capsule (100 mg total) by mouth daily as needed. 02/01/15 02/01/16  [name], MD  EASY COMFORT INSULIN SYRINGE 31G X 5/16" 1 ML MISC Use as directed daily for insulin injection. 10/05/14   Historical Provider, MD  feeding supplement, [place], ([place]) LIQD Take  237 mLs by mouth 2 (two) times daily between meals. 09/16/14   Dellia Nims, MD  ferrous sulfate 325 (65 FE) MG tablet Take 1 tablet (325 mg total) by mouth daily with breakfast. 10/28/14   Ejiroghene E Emokpae, MD  FLUoxetine (PROZAC) 10 MG capsule Take 1 capsule (10 mg total) by mouth daily. 09/23/14 09/23/15  [name], MD  folic acid (FOLVITE) 1 MG tablet Take 1 tablet (1 mg total) by mouth daily. 09/16/14   Tasrif Ahmed, MD  gabapentin (NEURONTIN) 600 MG tablet Take 1 tablet (600 mg total) by mouth 3 (three) times daily. 02/01/15   [name], MD  glucose blood (ACCU-CHEK  SMARTVIEW) test strip 1 each by Other route 4 (four) times daily -  before meals and at bedtime. Use as instructed 07/09/14   [name], MD  insulin aspart (NOVOLOG) 100 UNIT/ML injection Inject 5 Units into the skin 3 (three) times daily with meals. 09/16/14   Tasrif Ahmed, MD  insulin glargine (LANTUS) 100 UNIT/ML injection Inject 0.4 mLs (40 Units total) into the skin daily. Inject 22 units in the morning, and 18 units in the evening, 12 hours apart 03/24/15 03/23/16  Jule Ser, DO  LANTUS 100 UNIT/ML injection Inject 0.44 mLs (44 Units total) into the skin every morning. (1000 / 44units= 23days) 03/17/15   [name], MD  lisinopril (PRINIVIL,ZESTRIL) 20 MG tablet Take 1 tablet (20 mg total) by mouth daily. 10/28/14   Ejiroghene Arlyce Dice, MD  metFORMIN (GLUCOPHAGE) 500 MG tablet Take 2 tablets (1,000 mg total) by mouth daily with breakfast. 03/24/15 03/23/16  Jule Ser, DO  pantoprazole (PROTONIX) 40 MG tablet Take 1 tablet (40 mg total) by mouth 2 (two) times daily. 09/16/14   Tasrif Ahmed, MD  polyethylene glycol (MIRALAX / GLYCOLAX) packet Take 17 g by mouth daily as needed. 03/24/15   Jule Ser, DO  sildenafil (REVATIO) 20 MG tablet Take 2-5 tablets before sexual activity. 08/25/14   [name], MD  sucralfate (CARAFATE) 1 GM/10ML suspension Take 10 mLs (1 g total) by mouth 4 (four) times daily -  with meals and at bedtime. 10/27/14   [name], MD    ALLERGIES:  No Known Allergies  SOCIAL HISTORY:  Social History  Substance Use Topics  . Smoking status: Current Every Day Smoker -- 2.00 packs/day for 34 years    Types: Cigarettes, Cigars  . Smokeless tobacco: Never Used     Comment: 1/2 PPD  . Alcohol Use: 0.0 oz/week    0 Standard drinks or equivalent per week     Comment: occ    FAMILY HISTORY: Family History  Problem Relation Age of Onset  . Diabetes type II Mother   . Pancreatic cancer Father     EXAM: BP 152/84 mmHg  Pulse 96  Temp(Src) 98 F  (36.7 C) (Oral)  Resp 19  SpO2 100% CONSTITUTIONAL: Alert and oriented and responds appropriately to questions. Well-appearing, appears uncomfortable, appears dehydrated HEAD: Normocephalic EYES: Conjunctivae clear, PERRL ENT: normal nose; no rhinorrhea; dry mucous membranes; pharynx without lesions noted NECK: Supple, no meningismus, no LAD  CARD: regular and tachycardic; S1 and S2 appreciated; no murmurs, no clicks, no rubs, no gallops RESP: Normal chest excursion without splinting or tachypnea; breath sounds clear and equal bilaterally; no wheezes, no rhonchi, no rales, no hypoxia or respiratory distress, speaking full sentences ABD/GI: Normal bowel sounds; non-distended; soft, non-tender, no rebound, no guarding, no peritoneal signs BACK:  The back appears normal and is non-tender to  palpation, there is no CVA tenderness EXT: s/p bilateral BKA, Normal ROM in all joints; non-tender to palpation; no edema; normal capillary refill; no cyanosis, no calf tenderness or swelling    SKIN: Normal color for age and race; warm NEURO: Moves all extremities equally, sensation to light touch intact diffusely, cranial nerves II through XII intact PSYCH: The patient's mood and manner are appropriate. Grooming and personal hygiene are appropriate.  MEDICAL DECISION MAKING: Patient here with what appears to be DKA clinically. We'll obtain labs, urine. Will give IV fluids and start insulin drip. I suspect he will need admission.  ED PROGRESS: Labs show sodium 126, potassium 5.5, glucose 569, bicarbonate 10, creatinine 2.43, anion gap 23. He does have ketones in his urine. VBG shows a pH of 7.253. He is in DKA. Updated patient. Discussed with Dr. Denton Brick with internal medicine resident teaching service. Patient is an internal medicine patient. She agrees with admission to step down bed. Agrees to me placing holding orders. They're attending is Dr. Lalla Brothers. Care transferred to resident team.   CRITICAL  CARE Performed by: Nyra Jabs   Total critical care time: 40 minutes  Critical care time was exclusive of separately billable procedures and treating other patients.  Critical care was necessary to treat or prevent imminent or life-threatening deterioration.  Critical care was time spent personally by me on the following activities: development of treatment plan with patient and/or surrogate as well as nursing, discussions with consultants, evaluation of patient's response to treatment, examination of patient, obtaining history from patient or surrogate, ordering and performing treatments and interventions, ordering and review of laboratory studies, ordering and review of radiographic studies, pulse oximetry and re-evaluation of patient's condition.     EKG Interpretation  Date/Time:  Tuesday June 01 2015 06:34:38 EST Ventricular Rate:  115 PR Interval:  138 QRS Duration: 99 QT Interval:  363 QTC Calculation: 502 R Axis:   -59 Text Interpretation:  Sinus tachycardia Left anterior fascicular block Abnormal R-wave progression, early transition Prolonged QT interval No significant change since last tracing Confirmed by Deztiny Sarra,  DO, Keino Placencia [phone]) on 06/01/2015 [place], DO 06/01/15 [id number]

## 2015-06-02 ENCOUNTER — Encounter (HOSPITAL_COMMUNITY): Payer: Self-pay

## 2015-06-02 ENCOUNTER — Other Ambulatory Visit: Payer: Self-pay

## 2015-06-02 ENCOUNTER — Telehealth: Payer: Self-pay | Admitting: Internal Medicine

## 2015-06-02 LAB — BASIC METABOLIC PANEL
ANION GAP: 9 (ref 5–15)
BUN: 19 mg/dL (ref 6–20)
CALCIUM: 8.1 mg/dL — AB (ref 8.9–10.3)
CO2: 21 mmol/L — AB (ref 22–32)
Chloride: 103 mmol/L (ref 101–111)
Creatinine, Ser: 1.41 mg/dL — ABNORMAL HIGH (ref 0.61–1.24)
GFR calc Af Amer: >60 mL/min (ref >60)
GFR, EST NON AFRICAN AMERICAN: 57 mL/min — AB (ref >60)
GLUCOSE: 374 mg/dL — AB (ref 65–99)
Potassium: 4.4 mmol/L (ref 3.5–5.1)
Sodium: 133 mmol/L — ABNORMAL LOW (ref 135–145)

## 2015-06-02 LAB — GLUCOSE, CAPILLARY
GLUCOSE-CAPILLARY: 265 mg/dL — AB (ref 65–99)
GLUCOSE-CAPILLARY: 290 mg/dL — AB (ref 65–99)
GLUCOSE-CAPILLARY: 346 mg/dL — AB (ref 65–99)
Glucose-Capillary: 268 mg/dL — ABNORMAL HIGH (ref 65–99)
Glucose-Capillary: 220 mg/dL — ABNORMAL HIGH (ref 65–99)

## 2015-06-02 LAB — HEMOGLOBIN A1C
Hgb A1c MFr Bld: 14.3 % — ABNORMAL HIGH (ref 4.8–5.6)
MEAN PLASMA GLUCOSE: 364 mg/dL

## 2015-06-02 MED ORDER — ASPIRIN 81 MG PO CHEW
81.0000 mg | CHEWABLE_TABLET | Freq: Every day | ORAL | Status: DC
  Administered 2015-06-02 – 2015-06-03 (×2): 81 mg via ORAL
  Filled 2015-06-02 (×2): qty 1

## 2015-06-02 MED ORDER — PROMETHAZINE HCL 25 MG/ML IJ SOLN: 12.5000 mg | Freq: Four times a day (QID) | INTRAMUSCULAR | Status: DC | PRN

## 2015-06-02 MED ORDER — INSULIN GLARGINE 100 UNIT/ML ~~LOC~~ SOLN
22.0000 [IU] | Freq: Two times a day (BID) | SUBCUTANEOUS | Status: DC
  Administered 2015-06-02 – 2015-06-03 (×2): 22 [IU] via SUBCUTANEOUS
  Filled 2015-06-02 (×4): qty 0.22

## 2015-06-02 MED ORDER — INSULIN GLARGINE 100 UNIT/ML ~~LOC~~ SOLN
20.0000 [IU] | Freq: Two times a day (BID) | SUBCUTANEOUS | Status: DC
  Administered 2015-06-02: 20 [IU] via SUBCUTANEOUS
  Filled 2015-06-02 (×2): qty 0.2

## 2015-06-02 MED ORDER — PROMETHAZINE HCL 25 MG/ML IJ SOLN
12.5000 mg | Freq: Four times a day (QID) | INTRAMUSCULAR | Status: DC | PRN
  Administered 2015-06-02: 12.5 mg via INTRAVENOUS
  Filled 2015-06-02: qty 1

## 2015-06-02 MED ORDER — ATORVASTATIN CALCIUM 40 MG PO TABS: 40.0000 mg | ORAL_TABLET | Freq: Every day | ORAL | Status: DC

## 2015-06-02 MED ORDER — ATORVASTATIN CALCIUM 40 MG PO TABS
40.0000 mg | ORAL_TABLET | Freq: Every day | ORAL | Status: DC
  Administered 2015-06-02: 40 mg via ORAL
  Filled 2015-06-02: qty 1

## 2015-06-02 MED ORDER — ASPIRIN 81 MG PO CHEW: 81.0000 mg | CHEWABLE_TABLET | Freq: Every day | ORAL | Status: DC

## 2015-06-02 MED ORDER — INSULIN ASPART 100 UNIT/ML ~~LOC~~ SOLN: 0.0000 [IU] | Freq: Three times a day (TID) | SUBCUTANEOUS | Status: DC

## 2015-06-02 MED ORDER — ZOLPIDEM TARTRATE 5 MG PO TABS
5.0000 mg | ORAL_TABLET | Freq: Once | ORAL | Status: AC
  Administered 2015-06-02: 5 mg via ORAL
  Filled 2015-06-02: qty 1

## 2015-06-02 NOTE — Progress Notes (Signed)
Inpatient Diabetes Program Recommendations  AACE/ADA: New Consensus Statement on Inpatient Glycemic Control (2015)  Target Ranges:  Prepandial:   less than 140 mg/dL      Peak postprandial:   less than 180 mg/dL (1-2 hours)      Critically ill patients:  140 - 180 mg/dL   Results for PHILIPP, SCHNITKER (MRN [id number]) as of 06/02/2015 09:34  Ref. Range 06/01/2015 18:11 06/01/2015 19:22 06/01/2015 20:29 06/02/2015 00:23 06/02/2015 07:51  Glucose-Capillary Latest Ref Range: 65-99 mg/dL 184 (H) 165 (H) 124 (H) 265 (H) 290 (H)   Review of Glycemic Control  Diabetes history: DM 2 Outpatient Diabetes medications: Lantus 22 units QAM, 18 units QPM, Novolog 15-18 units TID meal coverage. Current orders for Inpatient glycemic control: Lantus 20 units QHS, Novolog Moderate TID  Inpatient Diabetes Program Recommendations: Insulin - Basal: Per home med rec patient takes Lantus 22 units QAM, 18 units QPM. Fasting glucose this am after 20 of basal coming off insulin gtt is 290's. Please increase frequency of basal insulin dose to home regimen. Lantus 18-20 units BID.  Will see patient in regards to A1c 14.3% on 06/01/15  Thanks,  Tama Headings RN, MSN, [facility] Inpatient Diabetes Coordinator Team Pager [phone] (8a-5p)

## 2015-06-02 NOTE — Progress Notes (Signed)
Patient stated he was not leaving until he spoke with the MD. Patient said "you can take me to the ED and we can start the process again".  MD notified.

## 2015-06-02 NOTE — Progress Notes (Signed)
   Subjective: [name] had no acute events overnight. He still has hiccups and intermittent episodes where he 'cant catch his breath' but otherwise has no complaints, including nausea, vomiting, abdominal pain, diarrhea or other symptoms.  Objective: Vital signs in last 24 hours: Filed Vitals:   06/01/15 2300 06/01/15 2330 06/02/15 0020 06/02/15 0535  BP: 123/75 142/60 124/81 138/79  Pulse: 114 102 108 106  Temp:   98.4 F (36.9 C) 98.4 F (36.9 C)  TempSrc:   Oral   Resp: 22 22 20 21   SpO2: 99% 98% 100% 100%   Weight change:   Intake/Output Summary (Last 24 hours) at 06/02/15 [id number] Last data filed at 06/02/15 0535  Gross per 24 hour  Intake    780 ml  Output    400 ml  Net    380 ml     Gen: Well-appearing, alert and oriented to person, place, and time HEENT: Oropharynx clear without erythema or exudate.  Neck: No cervical LAD, no thyromegaly or nodules, no JVD noted. CV: Normal rate, regular rhythm, no murmurs, rubs, or gallops Pulmonary: Normal effort, CTA bilaterally, no crackles or wheezes Abdominal: Soft, non-tender, non-distended, without rebound, guarding, or masses Extremities: s/p bilat BKA, skin intact  Lab Results: Basic Metabolic Panel:  Recent Labs Lab 06/01/15 1943 06/01/15 2202  NA 138 138  K 4.5 4.6  CL 107 107  CO2 20* 19*  GLUCOSE 150* 172*  BUN 30* 28*  CREATININE 1.54* 1.57*  CALCIUM 8.2* 8.2*   CBC:  Recent Labs Lab 06/01/15 0510  WBC 11.3*  HGB 9.6*  HCT 28.4*  MCV 84.8  PLT 388   Cardiac Enzymes:  Recent Labs Lab 06/01/15 0935 06/01/15 1452 06/01/15 2202  TROPONINI 0.54* 0.54* 0.49*   CBG:  Recent Labs Lab 06/01/15 1707 06/01/15 1811 06/01/15 1922 06/01/15 2029 06/02/15 0023 06/02/15 0751  GLUCAP 157* 184* 165* 124* 265* 290*   Hemoglobin A1C:  Recent Labs Lab 06/01/15 0935  HGBA1C 14.3*   Assessment/Plan: 1. DKA - gap closed x 3, likely 2/2 insulin noncompliance after N/V from drinking episode. Takes  lantus 22u am 18u pm. K normal. Tolerating diet. Likely fine to go home today with follow-up in our clinic. -Increase lantus back to home dose; CBG this morning 290 -Arrange transportation with SW today; greatly appreciate their help -Advance diet as tolerated -Trend BMPs -DM educator consulted; we greatly appreciate their help in managing this mutual patient  2. CAD - patient with flat elevated troponins ~0.5 here but no EKG changes. Given his HTN, terribly controlled diabetes, propensity for cocaine, his likelihood of demand ischemia from CAD is high, and his ability to tolerate a cath is likely quite low.  -Optimize medical management with statin, ASA, ACE/ARB, and beta blocker -Was previously on statin at least; will start with ASA, statin here, already on ACE, with plans to consider BB outpatient. If BB is decided upon, carvedilol or labetalol would be best options with ongoing cocaine use.  Dispo: Disposition is deferred at this time, awaiting improvement of current medical problems.  Anticipated discharge today.  The patient does have a current PCP Wesley Estelle, MD) and does need an [facility] hospital follow-up appointment after discharge.  The patient does have transportation limitations that hinder transportation to clinic appointments.  LOS: 1 day   Norval Gable, MD 06/02/2015, 9:58 AM

## 2015-06-02 NOTE — Progress Notes (Signed)
Inpatient Diabetes Program Recommendations  AACE/ADA: New Consensus Statement on Inpatient Glycemic Control (2015)  Target Ranges:  Prepandial:   less than 140 mg/dL      Peak postprandial:   less than 180 mg/dL (1-2 hours)      Critically ill patients:  140 - 180 mg/dL   Had an interesting conversation with patient. I introduced myself as a DM coordinator and was asking how the patient's blood sugar was running at home. Patient reports in the 300's, which is consistent with patient's A1c of 14.3%. Patient reports that his wife draws up insulin and he injects himself.  Spent quite awhile inpatients room and the conversation went from him being a [place] pig, to his and his wifes' prison history and drug habits from both of them. Patient reported "getting right with God and taking what he was suppose to and then he thinks he can do it all himself and not taking his meds and drinks and that is when he comes into the hospital. They spend money on cocaine and when the food stamps run out that is when we see admissions from him is at the end of the month. Patient is open to DM education, however patient states "now whether or not I will do it is another story." Patient is wanting to be connected to "someone who he can talk to abut his problems" and is also wanting to know about possibly getting Glucerna discounted or free.  I do not expect compliance with this patient.  Thanks,  Tama Headings RN, MSN, [facility] Inpatient Diabetes Coordinator Team Pager [phone] (8a-5p)

## 2015-06-02 NOTE — Telephone Encounter (Signed)
Pt needs TOC pt scheduled for 06/09/15 at 10:15 with Dr Genene Churn

## 2015-06-03 ENCOUNTER — Other Ambulatory Visit: Payer: Self-pay | Admitting: Internal Medicine

## 2015-06-03 DIAGNOSIS — Z9114 Patient's other noncompliance with medication regimen: Secondary | ICD-10-CM

## 2015-06-03 DIAGNOSIS — F14288 Cocaine dependence with other cocaine-induced disorder: Secondary | ICD-10-CM

## 2015-06-03 DIAGNOSIS — I2510 Atherosclerotic heart disease of native coronary artery without angina pectoris: Secondary | ICD-10-CM

## 2015-06-03 LAB — GLUCOSE, CAPILLARY
GLUCOSE-CAPILLARY: 323 mg/dL — AB (ref 65–99)
Glucose-Capillary: 163 mg/dL — ABNORMAL HIGH (ref 65–99)

## 2015-06-03 MED ORDER — INSULIN GLARGINE 100 UNIT/ML ~~LOC~~ SOLN: 22.0000 [IU] | Freq: Two times a day (BID) | SUBCUTANEOUS | Status: DC

## 2015-06-03 NOTE — Progress Notes (Signed)
Patient's IV infiltrated, possible discharge today. Patient doe snot want an IV if he is going home. MD on call notified.

## 2015-06-03 NOTE — Telephone Encounter (Signed)
Transition Care Management Follow-up Telephone Call   Date discharged?   How have you been since you were released from the hospital? BEAUTIFUL   Do you understand why you were in the hospital? yes   Do you understand the discharge instructions? yes   Where were you discharged to? HOME   Items Reviewed:  Medications reviewed: yes  Allergies reviewed: yes  Dietary changes reviewed: yes  Referrals reviewed: no   Functional Questionnaire:   Activities of Daily Living (ADLs):   He states they are independent in the following:    States they require assistance with the following: REFUSED   Any transportation issues/concerns?: REFUSED   Any patient concerns? REFUSED   Confirmed importance and date/time of follow-up visits scheduled YES  Provider Appointment booked with  Confirmed with patient if condition begins to worsen call PCP or go to the ER.  Patient was given the office number and encouraged to call back with question or concerns.  : no    PT STATED AFTER THE FIRST FEW QUESTIONS THAT WE WERE "TERMINATING" THE CONVERSATION, HE STATES EVERYTHING "WAS BEAUTIFIL, IS BEAUTIFUL AND WILL BE BEAUTIFUL" I CONFIRMED HIS APPT AND ADVISED TO CALL FOR ANY NEEDS, HE AGREED

## 2015-06-03 NOTE — Progress Notes (Signed)
Discharge instructions and medications discussed with patient.  All questions answered.  

## 2015-06-03 NOTE — Progress Notes (Signed)
   Subjective: [name] had no acute events overnight. His IV infiltrated but we do not need a new one given his expected discharge today. The hiccups and intermittent episodes where he 'cant catch his breath' are still intermittently present but otherwise has no complaints, including nausea, vomiting, chest pain, abdominal pain, diarrhea or other symptoms.  Objective: Vital signs in last 24 hours: Filed Vitals:   06/02/15 1000 06/02/15 1800 06/02/15 2146 06/03/15 0653  BP: 122/76 131/69 134/77 149/85  Pulse: 101 90 109 103  Temp: 98.5 F (36.9 C) 98.6 F (37 C) 98.4 F (36.9 C) 98.2 F (36.8 C)  TempSrc: Oral Oral    Resp: 20 20 18 21   SpO2: 100% 100% 100% 100%   Weight change:   Intake/Output Summary (Last 24 hours) at 06/03/15 [id number] Last data filed at 06/03/15 [id number]  Gross per 24 hour  Intake 3639.58 ml  Output   2900 ml  Net 739.58 ml     Gen: Well-appearing, alert and oriented to person, place, and time HEENT: Oropharynx clear without erythema or exudate.  Neck: No cervical LAD, no thyromegaly or nodules, no JVD noted. CV: Normal rate, regular rhythm, no murmurs, rubs, or gallops Pulmonary: Normal effort, CTA bilaterally, no crackles or wheezes Abdominal: Soft, non-tender, non-distended, without rebound, guarding, or masses Extremities: s/p bilat BKA, skin intact  Lab Results: Basic Metabolic Panel:  Recent Labs Lab 06/01/15 2202 06/02/15 1424  NA 138 133*  K 4.6 4.4  CL 107 103  CO2 19* 21*  GLUCOSE 172* 374*  BUN 28* 19  CREATININE 1.57* 1.41*  CALCIUM 8.2* 8.1*   CBC:  Recent Labs Lab 06/01/15 0510  WBC 11.3*  HGB 9.6*  HCT 28.4*  MCV 84.8  PLT 388   Cardiac Enzymes:  Recent Labs Lab 06/01/15 0935 06/01/15 1452 06/01/15 2202  TROPONINI 0.54* 0.54* 0.49*   CBG:  Recent Labs Lab 06/01/15 2029 06/02/15 0023 06/02/15 0751 06/02/15 1156 06/02/15 1652 06/02/15 2142  GLUCAP 124* 265* 290* 346* 268* 220*   Hemoglobin A1C:  Recent  Labs Lab 06/01/15 0935  HGBA1C 14.3*   Assessment/Plan: 1. DKA - gap closed, likely 2/2 insulin noncompliance after N/V from drinking episode. Takes lantus 22u am 18u pm. K normal. Tolerating diet. However, still with CBGs in high 200s (congruent with his AIC) -Increased lantus home regimen to 22U BID; CBGs better this AM -Arrange transportation with SW today; greatly appreciate their help -Advance diet as tolerated -Trend BMPs -DM educator consulted; we greatly appreciate their help in managing this mutual patient  2. CAD - patient with flat elevated troponins ~0.5 here but no EKG changes. Given his HTN, terribly controlled diabetes, propensity for cocaine, his likelihood of demand ischemia from CAD is high, and his ability to tolerate a cath is likely quite low.  -Optimize medical management with statin, ASA, ACE/ARB, and beta blocker -Started on ASA and statin here, already on ACE. Plans to consider BB outpatient. If BB is decided upon, carvedilol or labetalol would be best options with ongoing cocaine use.  Dispo: Disposition is deferred at this time, awaiting improvement of current medical problems.  Anticipated discharge today.  The patient does have a current PCP [name], MD) and does need an [facility] hospital follow-up appointment after discharge.  The patient does have transportation limitations that hinder transportation to clinic appointments.  LOS: 2 days   Wesley Gable, MD 06/03/2015, 7:18 AM

## 2015-06-03 NOTE — Progress Notes (Signed)
Utilization review completed. Emmy Keng, RN, BSN. 

## 2015-06-03 NOTE — Clinical Social Work Note (Signed)
[name] requested assistance with transportation after discharge. CSW provided patient's nurse with taxi [place]. There were no further questions or concerns at this time.

## 2015-06-05 NOTE — Discharge Summary (Signed)
Name: [name] MRN: [id number] DOB: [birth year] 50 y.o. PCP: [name], MD  Date of Admission: 06/01/2015  4:45 AM Date of Discharge: 06/05/2015 Attending Physician: No att. providers found  Discharge Diagnosis: 1. DKA  Principal Problem:   Diabetic ketoacidosis without coma ([place]) Active Problems:   Diabetes mellitus type 2, uncontrolled, with complications (HCC)   Polysubstance abuse   Essential hypertension   Depression   GERD (gastroesophageal reflux disease)   Anemia, iron deficiency   Phantom limb pain (HCC)   DKA (diabetic ketoacidoses) ([place])   Acute-on-chronic kidney injury ([place])  Discharge Medications:   Medication List    TAKE these medications        ACCU-CHEK FASTCLIX LANCETS Misc  1 each by Does not apply route 4 (four) times daily -  before meals and at bedtime.     amLODipine 10 MG tablet  Commonly known as:  NORVASC  Take 1 tablet (10 mg total) by mouth daily.     aspirin 81 MG chewable tablet  Chew 1 tablet (81 mg total) by mouth daily.     atorvastatin 40 MG tablet  Commonly known as:  LIPITOR  Take 1 tablet (40 mg total) by mouth daily at 6 PM.     docusate sodium 100 MG capsule  Commonly known as:  COLACE  Take 1 capsule (100 mg total) by mouth daily as needed.     EASY COMFORT INSULIN SYRINGE 31G X 5/16" 1 ML Misc  Generic drug:  Insulin Syringe-Needle U-100  Use as directed daily for insulin injection.     feeding supplement (GLUCERNA SHAKE) Liqd  Take 237 mLs by mouth 2 (two) times daily between meals.     ferrous sulfate 325 (65 FE) MG tablet  Take 1 tablet (325 mg total) by mouth daily with breakfast.     FLUoxetine 10 MG capsule  Commonly known as:  PROZAC  Take 1 capsule (10 mg total) by mouth daily.     folic acid 1 MG tablet  Commonly known as:  FOLVITE  Take 1 tablet (1 mg total) by mouth daily.     gabapentin 600 MG tablet  Commonly known as:  NEURONTIN  Take 1 tablet (600 mg total) by mouth 3 (three) times  daily.     glucose blood test strip  Commonly known as:  ACCU-CHEK SMARTVIEW  1 each by Other route 4 (four) times daily -  before meals and at bedtime. Use as instructed     insulin aspart 100 UNIT/ML injection  Commonly known as:  novoLOG  Inject 5 Units into the skin 3 (three) times daily with meals.     insulin aspart 100 UNIT/ML injection  Commonly known as:  novoLOG  Inject 0-15 Units into the skin 3 (three) times daily with meals.     insulin glargine 100 UNIT/ML injection  Commonly known as:  LANTUS  Inject 0.22 mLs (22 Units total) into the skin 2 (two) times daily.     lisinopril 20 MG tablet  Commonly known as:  PRINIVIL,ZESTRIL  Take 1 tablet (20 mg total) by mouth daily.     metFORMIN 500 MG tablet  Commonly known as:  GLUCOPHAGE  Take 2 tablets (1,000 mg total) by mouth daily with breakfast.     pantoprazole 40 MG tablet  Commonly known as:  PROTONIX  Take 1 tablet (40 mg total) by mouth 2 (two) times daily.     polyethylene glycol packet  Commonly known as:  MIRALAX /  GLYCOLAX  Take 17 g by mouth daily as needed.     sildenafil 20 MG tablet  Commonly known as:  REVATIO  Take 2-5 tablets before sexual activity.     sucralfate 1 GM/10ML suspension  Commonly known as:  CARAFATE  Take 10 mLs (1 g total) by mouth 4 (four) times daily -  with meals and at bedtime.        Disposition and follow-up:   [name] was discharged from [facility] in Good condition.  At the hospital follow up visit please address:  1.  Insulin regimen compliance, crack cocaine abstinance   2.  Labs / imaging needed at time of follow-up: CBG  3.  Pending labs/ test needing follow-up: None  Follow-up Appointments:     Follow-up Information    Follow up with Dellia Nims, MD. Go on 06/09/2015.   Specialty:  Internal Medicine   Why:  1015AM   Contact information:   [place] [address] [phone]       Discharge  Instructions: Discharge Instructions    Diet Carb Modified    Complete by:  As directed      Discharge instructions    Complete by:  As directed   Mr. Stembridge,  Please start taking lipitor and baby aspirin once per day. I have sent prescriptions to your pharmacy. Make sure to continue to take your insulin and metformin as prescribed. If you have another episode of feeling bad after drinking again, you still need to take your insulin, just try taking half of your dose when you don't eat.    Also, please follow up with our clinic in the next week for hospital follow-up.  Thanks, Billy     Increase activity slowly    Complete by:  As directed      Increase activity slowly    Complete by:  As directed           Admission HPI: Mr. Nartker is a 50 year old male with PMH of IDDM, HTN and polysubstance abuse here with c/o N/V and abdominal pain x 1 week. Symptoms started last Wednesday after drinking 1/2 pint of wine on Tuesday night. He normally drinks 1/2 pint once weekly and says there was nothing unusual about the amount or type of EtOH he drank last Tuesday but he attributes symptoms to this. Associated symptoms include chills and generalized weakness. He denies new foods or sick contacts. Since he had decreased po and was vomiting (2-3x daily) he stopped taking his insulin last Wednesday. He is still vomiting 2-3x daily. He says initially vomit was red x 2 days but has been "clear" since then. He denies diarrhea, chest pain, dyspnea or cough. He admits to daily THC use and weekly cocaine (smokes) use.   Hospital Course by problem list: Principal Problem:   Diabetic ketoacidosis without coma ([place]) Active Problems:   Diabetes mellitus type 2, uncontrolled, with complications (HCC)   Polysubstance abuse   Essential hypertension   Depression   GERD (gastroesophageal reflux disease)   Anemia, iron deficiency   Phantom limb pain (HCC)   DKA (diabetic ketoacidoses) ([place])    Acute-on-chronic kidney injury ([place])   1. DKA - presented with DKA with anion gap of 23, K 5.5, CBG 569. No EKG changes were present on admission. His metabolic derangement and symptoms of malaise and nausea/vomiting resolved with insulin and aggressive fluid resuscitation. After his AG had closed, he was started back  on his home lantus, tolerated PO diet well, and was deemed appropriate for discharge. He was given explicit instructions to continue to take half of his insulin (at least) even if he feels unwell and isn't eating, in an attempt to try and prevent these episodes from happening as frequently. His home lantus dose was increased slightly to 22U qAM and 22U qPM.   2. Elevated troponins - initially had flat troponins x3 of ~0.5 that, given his dehydration and cocaine use, was thought to be related to demand ischemia. His likelihood of CAD given his multiple RFs and troponin elevation was believed to be very high (therefore diagnostic cath would be a higher-risk, lower-benefit procedure in his case given his multiple medical comorbidities and high pretest probability of CAD), so he was discharged on ASA 81 and atorvastatin 40mg  here. He was already on an ACE inhibitor. We did not start a BB inpatient, and given his cocaine use, carvedilol or labetalol would be the most appropriate options for BB, if any were considered to optimize CAD medical management.  Discharge Vitals:   BP 133/72 mmHg  Pulse 115  Temp(Src) 98.3 F (36.8 C) (Oral)  Resp 20  SpO2 98%  Discharge Labs:  No results found for this or any previous visit (from the past 24 hour(s)).  Signed: Norval Gable, MD 06/05/2015, 7:51 AM

## 2015-06-09 ENCOUNTER — Encounter: Payer: Self-pay | Admitting: Internal Medicine

## 2015-06-09 ENCOUNTER — Ambulatory Visit: Payer: Medicaid Other | Admitting: Internal Medicine

## 2015-06-19 ENCOUNTER — Encounter (HOSPITAL_COMMUNITY): Payer: Self-pay

## 2015-06-19 ENCOUNTER — Emergency Department (HOSPITAL_COMMUNITY)
Admission: EM | Admit: 2015-06-19 | Discharge: 2015-06-19 | Disposition: A | Discharge status: Home / Self Care | Payer: Medicaid Other | Attending: Emergency Medicine | Admitting: Emergency Medicine

## 2015-06-19 DIAGNOSIS — F17210 Nicotine dependence, cigarettes, uncomplicated: Secondary | ICD-10-CM | POA: Insufficient documentation

## 2015-06-19 DIAGNOSIS — Z79899 Other long term (current) drug therapy: Secondary | ICD-10-CM | POA: Diagnosis not present

## 2015-06-19 DIAGNOSIS — Z9114 Patient's other noncompliance with medication regimen: Secondary | ICD-10-CM | POA: Insufficient documentation

## 2015-06-19 DIAGNOSIS — E785 Hyperlipidemia, unspecified: Secondary | ICD-10-CM | POA: Insufficient documentation

## 2015-06-19 DIAGNOSIS — Z8619 Personal history of other infectious and parasitic diseases: Secondary | ICD-10-CM | POA: Diagnosis not present

## 2015-06-19 DIAGNOSIS — E1142 Type 2 diabetes mellitus with diabetic polyneuropathy: Secondary | ICD-10-CM | POA: Insufficient documentation

## 2015-06-19 DIAGNOSIS — K219 Gastro-esophageal reflux disease without esophagitis: Secondary | ICD-10-CM | POA: Insufficient documentation

## 2015-06-19 DIAGNOSIS — Z872 Personal history of diseases of the skin and subcutaneous tissue: Secondary | ICD-10-CM | POA: Insufficient documentation

## 2015-06-19 DIAGNOSIS — I129 Hypertensive chronic kidney disease with stage 1 through stage 4 chronic kidney disease, or unspecified chronic kidney disease: Secondary | ICD-10-CM | POA: Insufficient documentation

## 2015-06-19 DIAGNOSIS — E1165 Type 2 diabetes mellitus with hyperglycemia: Secondary | ICD-10-CM | POA: Insufficient documentation

## 2015-06-19 DIAGNOSIS — E860 Dehydration: Secondary | ICD-10-CM

## 2015-06-19 DIAGNOSIS — Z7982 Long term (current) use of aspirin: Secondary | ICD-10-CM | POA: Diagnosis not present

## 2015-06-19 DIAGNOSIS — Z7984 Long term (current) use of oral hypoglycemic drugs: Secondary | ICD-10-CM | POA: Diagnosis not present

## 2015-06-19 DIAGNOSIS — R1110 Vomiting, unspecified: Secondary | ICD-10-CM | POA: Diagnosis present

## 2015-06-19 DIAGNOSIS — N182 Chronic kidney disease, stage 2 (mild): Secondary | ICD-10-CM | POA: Diagnosis not present

## 2015-06-19 DIAGNOSIS — Z794 Long term (current) use of insulin: Secondary | ICD-10-CM | POA: Insufficient documentation

## 2015-06-19 DIAGNOSIS — R739 Hyperglycemia, unspecified: Secondary | ICD-10-CM

## 2015-06-19 LAB — BASIC METABOLIC PANEL
ANION GAP: 16 — AB (ref 5–15)
BUN: 18 mg/dL (ref 6–20)
CHLORIDE: 96 mmol/L — AB (ref 101–111)
CO2: 26 mmol/L (ref 22–32)
Calcium: 10.7 mg/dL — ABNORMAL HIGH (ref 8.9–10.3)
Creatinine, Ser: 1.46 mg/dL — ABNORMAL HIGH (ref 0.61–1.24)
GFR calc non Af Amer: 55 mL/min — ABNORMAL LOW (ref >60)
GLUCOSE: 528 mg/dL — AB (ref 65–99)
POTASSIUM: 4.5 mmol/L (ref 3.5–5.1)
Sodium: 138 mmol/L (ref 135–145)

## 2015-06-19 LAB — CBC
HCT: 31.7 % — ABNORMAL LOW (ref 39.0–52.0)
Hemoglobin: 10 g/dL — ABNORMAL LOW (ref 13.0–17.0)
MCH: 26.9 pg (ref 26.0–34.0)
MCHC: 31.5 g/dL (ref 30.0–36.0)
MCV: 85.2 fL (ref 78.0–100.0)
PLATELETS: 608 10*3/uL — AB (ref 150–400)
RBC: 3.72 MIL/uL — AB (ref 4.22–5.81)
RDW: 14 % (ref 11.5–15.5)
WBC: 8.1 10*3/uL (ref 4.0–10.5)

## 2015-06-19 LAB — CBG MONITORING, ED
Glucose-Capillary: 478 mg/dL — ABNORMAL HIGH (ref 65–99)
Glucose-Capillary: 286 mg/dL — ABNORMAL HIGH (ref 65–99)

## 2015-06-19 MED ORDER — SODIUM CHLORIDE 0.9 % IV BOLUS (SEPSIS)
1000.0000 mL | Freq: Once | INTRAVENOUS | Status: AC
  Administered 2015-06-19: 1000 mL via INTRAVENOUS

## 2015-06-19 MED ORDER — SODIUM CHLORIDE 0.9 % IV SOLN
1000.0000 mL | Freq: Once | INTRAVENOUS | Status: AC
  Administered 2015-06-19: 1000 mL via INTRAVENOUS

## 2015-06-19 MED ORDER — ONDANSETRON HCL 4 MG/2ML IJ SOLN
4.0000 mg | Freq: Once | INTRAMUSCULAR | Status: AC
  Administered 2015-06-19: 4 mg via INTRAVENOUS
  Filled 2015-06-19: qty 2

## 2015-06-19 MED ORDER — INSULIN ASPART 100 UNIT/ML ~~LOC~~ SOLN
10.0000 [IU] | Freq: Once | SUBCUTANEOUS | Status: AC
  Administered 2015-06-19: 10 [IU] via SUBCUTANEOUS
  Filled 2015-06-19: qty 1

## 2015-06-19 MED ORDER — SODIUM CHLORIDE 0.9 % IV SOLN
1000.0000 mL | INTRAVENOUS | Status: DC
  Administered 2015-06-19: 1000 mL via INTRAVENOUS

## 2015-06-19 NOTE — ED Notes (Signed)
Pt arrived via EMS c/o vomiting x5 days with upper abdominal pain.

## 2015-06-19 NOTE — ED Notes (Signed)
Pt upset about going home states "I know my body and its not fixed yet".

## 2015-06-19 NOTE — ED Notes (Signed)
Blood sugar 286.

## 2015-06-19 NOTE — ED Notes (Signed)
Pt stable, ambulatory, states understanding of discharge instructions 

## 2015-06-19 NOTE — ED Notes (Signed)
CBG was 478

## 2015-06-19 NOTE — Discharge Instructions (Signed)

## 2015-06-19 NOTE — ED Notes (Signed)
Pt refuses to sign

## 2015-06-19 NOTE — ED Provider Notes (Signed)
CSN: [id number]     Arrival date & time 06/19/15  0220 History  By signing my name below, I, Hansel Feinstein, attest that this documentation has been prepared under the direction and in the presence of Jola Schmidt, MD. Electronically Signed: Hansel Feinstein, ED Scribe. 06/19/2015. 3:40 AM.    Chief Complaint  Patient presents with  . Emesis   The history is provided by the patient. No language interpreter was used.   HPI Comments: [name] is a 50 y.o. male with h/o HTN, DM2, HLD, CKD, bilateral BKA, GERD who presents to the Emergency Department complaining of moderate, intermittent NBNB emesis onset today with associated nausea. He states he was recently admitted to the hospital on 06/01/15 for dehydration d/t emesis. Pt notes that he has not been consuming adequate fluids and foods since discharge. He states he has not been checking his CBGs due to a lack of strips. Pt is not on dialysis. Pt denies diarrhea.  Past Medical History  Diagnosis Date  . Onychomycosis   . Hypertension   . Diabetic peripheral neuropathy ([place])   . Diabetic foot ulcer ([place])     s/p Right first ray amputation, left transmetatarsal amputation with revision  . History of drug abuse     Cocaine and marijuana  . Exposure to trichomonas     treated empirically  . Cellulitis of left foot 03/2008    left 4th and 5th metatarsal area  . Hyperlipidemia   . 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 Mallory [name] tear April 2009  . History of prolonged Q-T interval on ECG   . 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  . 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 CKD (chronic kidney disease) stage 2, GFR 60-89 ml/min     BL SCr 1.3-1.4  . Depression   . Peripheral vascular disease ([place])   . Type 2 diabetes mellitus,  uncontrolled, with renal complications ([place])   . GERD (gastroesophageal reflux disease)   . Hx of right BKA ([place]) 09/13/2012     Due to severe wound infection with sepsis  . 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 S/P bilateral BKA (below knee amputation) ([place]) 12/12/13    L BKA  . Cellulitis and abscess of leg 01/31/2014  . Osteomyelitis of left leg ([place]) 01/21/2014  . History of left below knee amputation ([place]) 01/21/2014    Performed on    . Status post below knee amputation of right lower extremity ([place]) 03/23/2014   Past Surgical History  Procedure Laterality Date  . Toe amputation  06/2011    left; 4th toe  . Nephrectomy      L side  . I&d extremity  08/24/2011    Procedure: IRRIGATION AND DEBRIDEMENT EXTREMITY;  Surgeon: Meredith Pel, MD;  Location: [facility];  Service: Orthopedics;  Laterality: Left;  . Amputation  08/24/2011    Procedure: AMPUTATION RAY;  Surgeon: Meredith Pel, MD;  Location: [facility];  Service: Orthopedics;  Laterality: Left;  left fourth toe Ray Resection  . Amputation  12/05/2011    Procedure: AMPUTATION RAY;  Surgeon: Meredith Pel, MD;  Location: [facility];  Service: Orthopedics;  Laterality: Left;  .  I&d extremity  02/18/2012    Procedure: IRRIGATION AND DEBRIDEMENT EXTREMITY;  Surgeon: Mcarthur Rossetti, MD;  Location: [facility];  Service: Orthopedics;  Laterality: Left;  . Amputation  02/18/2012    Procedure: AMPUTATION DIGIT;  Surgeon: Mcarthur Rossetti, MD;  Location: [facility];  Service: Orthopedics;  Laterality: Left;  revison transmetatarsal  . I&d extremity Left 08/28/2012    Procedure: IRRIGATION AND DEBRIDEMENT EXTREMITY;  Surgeon: Linna Hoff, MD;  Location: [facility] [place];  Service: Orthopedics;  Laterality: Left;  CYSTO TUBING/IRRIGATION, LEAD HAND.  . I&d extremity Left 08/30/2012    Procedure: IRRIGATION AND DEBRIDEMENT EXTREMITY;  Surgeon: Linna Hoff, MD;  Location: [facility];  Service: Orthopedics;  Laterality: Left;  . Amputation Right 09/01/2012    Procedure: FOOT 1ST RAY AMPUTATION;  Surgeon: Newt Minion, MD;  Location: [facility];  Service: Orthopedics;  Laterality: Right;  . Amputation Left 09/01/2012    Procedure: FOOT REVISION TRANSMETATARSAL AMPUTATION ;  Surgeon: Newt Minion, MD;  Location: [place];  Service: Orthopedics;  Laterality: Left;  . Amputation Right 09/13/2012    Procedure: AMPUTATION BELOW KNEE;  Surgeon: Newt Minion, MD;  Location: [facility];  Service: Orthopedics;  Laterality: Right;  Right Below Knee Amputation  . Amputation Left 12/12/2013    Procedure: Left Below Knee Amputation;  Surgeon: Newt Minion, MD;  Location: [place];  Service: Orthopedics;  Laterality: Left;  . Stump revision Left 01/23/2014    Procedure: STUMP REVISION;  Surgeon: Newt Minion, MD;  Location: [place];  Service: Orthopedics;  Laterality: Left;  . Esophagogastroduodenoscopy (egd) with propofol N/A 09/16/2014    Procedure: ESOPHAGOGASTRODUODENOSCOPY (EGD) WITH PROPOFOL;  Surgeon: Wonda Horner, MD;  [facility] END[facility]COPY;  Service: Endoscopy;  Laterality: N/A;   Family History  Problem Relation Age of Onset  . Diabetes type II Mother   . Pancreatic cancer Father    Social History  Substance Use Topics  . Smoking status: Current Every Day Smoker -- 2.00 packs/day for 34 years    Types: Cigarettes, Cigars  . Smokeless tobacco: Never Used     Comment: 1/2 PPD  . Alcohol Use: 0.0 oz/week    0 Standard drinks or equivalent per week     Comment: occ    Review of Systems A complete 10 system review of systems was obtained and all systems are negative except as noted in the HPI and PMH.    Allergies  Review of patient's allergies indicates no known allergies.  Home Medications   Prior to Admission medications   Medication Sig Start Date End Date Taking? Authorizing Provider  amLODipine (NORVASC) 10 MG tablet Take 1 tablet (10 mg total) by  mouth daily. 03/14/15  Yes Milagros Loll, MD  aspirin 81 MG chewable tablet Chew 1 tablet (81 mg total) by mouth daily. 06/02/15  Yes Norval Gable, MD  atorvastatin (LIPITOR) 40 MG tablet Take 1 tablet (40 mg total) by mouth daily at 6 PM. 06/02/15  Yes Norval Gable, MD  docusate sodium (COLACE) 100 MG capsule Take 1 capsule (100 mg total) by mouth daily as needed. Patient taking differently: Take 100 mg by mouth daily as needed for mild constipation.  02/01/15 02/01/16 Yes [name], MD  feeding supplement, GLUCERNA SHAKE, (GLUCERNA SHAKE) LIQD Take 237 mLs by mouth 2 (two) times daily between meals. 09/16/14  Yes Tasrif Ahmed, MD  ferrous sulfate 325 (65 FE) MG tablet Take 1 tablet (325 mg total)  by mouth daily with breakfast. 10/28/14  Yes Ejiroghene E Emokpae, MD  FLUoxetine (PROZAC) 10 MG capsule Take 1 capsule (10 mg total) by mouth daily. 09/23/14 09/23/15 Yes [name], MD  folic acid (FOLVITE) 1 MG tablet Take 1 tablet (1 mg total) by mouth daily. 09/16/14  Yes Tasrif Ahmed, MD  gabapentin (NEURONTIN) 600 MG tablet Take 1 tablet (600 mg total) by mouth 3 (three) times daily. 02/01/15  Yes [name], MD  insulin aspart (NOVOLOG) 100 UNIT/ML injection Inject 0-15 Units into the skin 3 (three) times daily with meals. 06/02/15  Yes Norval Gable, MD  insulin glargine (LANTUS) 100 UNIT/ML injection Inject 0.22 mLs (22 Units total) into the skin 2 (two) times daily. 06/03/15 06/02/16 Yes Rushil Sherrye Payor, MD  lisinopril (PRINIVIL,ZESTRIL) 20 MG tablet Take 1 tablet (20 mg total) by mouth daily. 10/28/14  Yes Ejiroghene Arlyce Dice, MD  metFORMIN (GLUCOPHAGE) 500 MG tablet Take 2 tablets (1,000 mg total) by mouth daily with breakfast. 03/24/15 03/23/16 Yes Jule Ser, DO  pantoprazole (PROTONIX) 40 MG tablet Take 1 tablet (40 mg total) by mouth 2 (two) times daily. 09/16/14  Yes Tasrif Ahmed, MD  polyethylene glycol (MIRALAX / GLYCOLAX) packet MIX 1 PACKET INTO 8 OUNCES OF LIQUID  DAILY AS NEEDED 06/06/15  Yes [name], MD  sildenafil (REVATIO) 20 MG tablet Take 2-5 tablets before sexual activity. 08/25/14  Yes [name], MD  sucralfate (CARAFATE) 1 GM/10ML suspension Take 10 mLs (1 g total) by mouth 4 (four) times daily -  with meals and at bedtime. 10/27/14  Yes [name], MD  ACCU-CHEK FASTCLIX LANCETS MISC 1 each by Does not apply route 4 (four) times daily -  before meals and at bedtime. 03/23/14   Florinda Marker, MD  EASY COMFORT INSULIN SYRINGE 31G X 5/16" 1 ML MISC Use as directed daily for insulin injection. 10/05/14   Historical Provider, MD  glucose blood (ACCU-CHEK SMARTVIEW) test strip 1 each by Other route 4 (four) times daily -  before meals and at bedtime. Use as instructed 07/09/14   [name], MD  insulin aspart (NOVOLOG) 100 UNIT/ML injection Inject 5 Units into the skin 3 (three) times daily with meals. Patient not taking: Reported on 06/19/2015 09/16/14   Tasrif Ahmed, MD   BP 163/93 mmHg  Pulse 104  SpO2 100% Physical Exam  Constitutional: He is oriented to person, place, and time. He appears well-developed and well-nourished.  Bilateral BKA  HENT:  Head: Normocephalic and atraumatic.  Eyes: EOM are normal.  Neck: Normal range of motion.  Cardiovascular: Normal rate, regular rhythm, normal heart sounds and intact distal pulses.   Pulmonary/Chest: Effort normal and breath sounds normal. No respiratory distress.  Abdominal: Soft. He exhibits no distension. There is no tenderness.  Musculoskeletal: Normal range of motion.  Neurological: He is alert and oriented to person, place, and time.  Skin: Skin is warm and dry.  Psychiatric: He has a normal mood and affect. Judgment normal.  Nursing note and vitals reviewed.   ED Course  Procedures (including critical care time) DIAGNOSTIC STUDIES: Oxygen Saturation is 100% on RA, normal by my interpretation.    COORDINATION OF CARE: 3:40 AM Discussed treatment plan with pt at bedside  which includes lab work, fluids and pt agreed to plan.   Labs Review Labs Reviewed  CBC - Abnormal; Notable for the following:    RBC 3.72 (*)    Hemoglobin 10.0 (*)    HCT 31.7 (*)  Platelets 608 (*)    All other components within normal limits  BASIC METABOLIC PANEL - Abnormal; Notable for the following:    Chloride 96 (*)    Glucose, Bld 528 (*)    Creatinine, Ser 1.46 (*)    Calcium 10.7 (*)    GFR calc non Af Amer 55 (*)    Anion gap 16 (*)    All other components within normal limits  CBG MONITORING, ED - Abnormal; Notable for the following:    Glucose-Capillary 478 (*)    All other components within normal limits    I have personally reviewed and evaluated these lab results as part of my medical decision-making.   MDM   Final diagnoses:  Hyperglycemia  H/O medication noncompliance  Dehydration    Hyperosmolar hyperglycemia.  Patient hydrated in the emergency department.  IV insulin given.  He has long-standing history of noncompliance with his medications and poorly controlled diabetes.  He has not been checking his blood sugar at home.  He states he does now have the test strips at the pharmacy.  I've asked that he fill his prescription for his test strips and continue to monitor his blood sugar at home.  I stressed to him the importance of glycemic control and try to have him understand that good glycemic control will lead to less dehydration.  I personally performed the services described in this documentation, which was scribed in my presence. The recorded information has been reviewed and is accurate.       Jola Schmidt, MD 06/19/15 [phone]

## 2015-06-24 ENCOUNTER — Other Ambulatory Visit: Payer: Self-pay | Admitting: Internal Medicine

## 2015-06-29 NOTE — Telephone Encounter (Signed)
Spoke with patient this am.  He agreed to an appt next Monday 07-05-15 @ 1:15 pm with Dr. Lovena Le.

## 2015-07-05 ENCOUNTER — Ambulatory Visit: Payer: Medicaid Other | Admitting: Internal Medicine

## 2015-07-09 ENCOUNTER — Encounter: Payer: Self-pay | Admitting: Internal Medicine

## 2015-07-09 ENCOUNTER — Ambulatory Visit (INDEPENDENT_AMBULATORY_CARE_PROVIDER_SITE_OTHER): Payer: Medicaid Other | Admitting: Internal Medicine

## 2015-07-09 VITALS — BP 137/84 | HR 102 | Temp 98.2°F | Wt 188.4 lb

## 2015-07-09 DIAGNOSIS — E1165 Type 2 diabetes mellitus with hyperglycemia: Secondary | ICD-10-CM | POA: Diagnosis not present

## 2015-07-09 DIAGNOSIS — Z89511 Acquired absence of right leg below knee: Secondary | ICD-10-CM | POA: Diagnosis not present

## 2015-07-09 DIAGNOSIS — Z89512 Acquired absence of left leg below knee: Secondary | ICD-10-CM | POA: Diagnosis not present

## 2015-07-09 DIAGNOSIS — K5900 Constipation, unspecified: Secondary | ICD-10-CM | POA: Diagnosis not present

## 2015-07-09 DIAGNOSIS — E118 Type 2 diabetes mellitus with unspecified complications: Secondary | ICD-10-CM

## 2015-07-09 DIAGNOSIS — E1140 Type 2 diabetes mellitus with diabetic neuropathy, unspecified: Secondary | ICD-10-CM

## 2015-07-09 DIAGNOSIS — K5909 Other constipation: Secondary | ICD-10-CM

## 2015-07-09 DIAGNOSIS — Z794 Long term (current) use of insulin: Secondary | ICD-10-CM

## 2015-07-09 DIAGNOSIS — IMO0002 Reserved for concepts with insufficient information to code with codable children: Secondary | ICD-10-CM

## 2015-07-09 MED ORDER — SENNOSIDES-DOCUSATE SODIUM 8.6-50 MG PO TABS: 1.0000 | ORAL_TABLET | Freq: Every day | ORAL | Status: AC

## 2015-07-09 MED ORDER — INSULIN PEN NEEDLE 29G X 12.7MM MISC: Status: DC

## 2015-07-09 MED ORDER — AMITRIPTYLINE HCL 50 MG PO TABS: 50.0000 mg | ORAL_TABLET | Freq: Every day | ORAL | Status: DC

## 2015-07-09 MED ORDER — LIRAGLUTIDE 18 MG/3ML ~~LOC~~ SOPN: 0.6000 mg | PEN_INJECTOR | Freq: Every day | SUBCUTANEOUS | Status: DC

## 2015-07-09 NOTE — Assessment & Plan Note (Signed)
miralax and colace is not helping. Chance colace to senokot. Constipation could be the result of uncontrolled diabetes.

## 2015-07-09 NOTE — Assessment & Plan Note (Signed)
Here for HFU for DKA. Last A1c 14.3 06/01/2015. Currently On novolog SSI (takes 2-5 units when sugar >200,  lantus 18 in AM and 20 at night metformin 1000mg  daily. Did not bring meter. States sugar usually 170 to 250, no lows. States compliance with meds.   - asked him to cont checking sugar 3 times daily and bring his meter next time and every time. - will add Victoza 0.6mg  daily to his current regimen and continue lantus 18/20 + SSI novolog + metformin 1000mg  bid. - f/up in 1 month.

## 2015-07-09 NOTE — Patient Instructions (Addendum)
Please bring your meter with you.  Continue gabapentin 600mg  three times, also take amirtyptyline 50mg  at night.  Start using victoza 0.6mg  daily.   Please f/up with your primary doctor in 1 month for DM II.

## 2015-07-09 NOTE — Assessment & Plan Note (Signed)
Requesting bedside commode and shower chair. Ordered these.

## 2015-07-09 NOTE — Assessment & Plan Note (Signed)
Continues to have pain on gabapentin 600mg  tid. I don't feel giving him opiate for this as he has hx of cocaine abuse and also neuropathic pain does not respond too well to opiates.  - will do trial of amitriptyline 50mg  daily. Continue gabapentin 600mg  tid.

## 2015-07-09 NOTE — Progress Notes (Signed)
   Subjective:    Patient ID: [name], male    DOB: [birth year], 50 y.o.   MRN: [id number]  HPI  4 with hx of cocaine abuse, depression, GERD, CKD, b/l BKA, uncontrolled DM II, who was admitted on 2/21-2/23 for DKA 2/2 to poor access to insulin here for hospital follow up. Missed HFU appt on 06/09/15.  Also had trop elevation (peaked 0.54) thought to be 2/2 to demand ischemia from cocaine abuse and dehydration from DKA.  DM II - a1c 14.3 06/01/2015. On novolog SSI (takes 2-5 units when sugar >200,  lantus 18 in AM and 20 at night metformin 1000mg  daily. Did not bring meter. States sugar usually 170 to 250, no lows. States compliance with meds.   Having numbness/ pain, some shaking of the right arm. Gabapentin is not helping much, also doing tylenol for this with some mild relief. Wants opiate, but I discussed I don't feel safe doing that with his cocaine abuse hx.   Constipation: states colace and miralax is not helping.   Wants bed side commode and shower chair. He has b/l BKA.      Review of Systems  Constitutional: Negative for fever, chills and fatigue.  HENT: Negative for congestion.   Respiratory: Negative for cough and shortness of breath.   Cardiovascular: Negative for chest pain and palpitations.  Gastrointestinal: Negative for abdominal pain and abdominal distention.  Genitourinary: Negative for dysuria and flank pain.  Musculoskeletal:       Right arm pain  Neurological: Positive for numbness. Negative for dizziness, light-headedness and headaches.       Objective:   Physical Exam  Constitutional: He is oriented to person, place, and time. He appears well-developed and well-nourished. No distress.  HENT:  Head: Normocephalic and atraumatic.  Eyes: Pupils are equal, round, and reactive to light.  Neck: Normal range of motion.  Cardiovascular: Normal rate and regular rhythm.  Exam reveals no gallop and no friction rub.   No murmur heard. Pulmonary/Chest: Effort  normal and breath sounds normal. No respiratory distress. He has no wheezes.  Abdominal: Soft. Bowel sounds are normal. He exhibits no distension. There is no tenderness.  Musculoskeletal: Normal range of motion. He exhibits no edema or tenderness.  B/l BKA.   Right arm normal ROM. 5/5 str bilaterally. No tenderness. No abnormalities.  Neurological: He is alert and oriented to person, place, and time. No cranial nerve deficit.  Skin: He is not diaphoretic.   Filed Vitals:   07/09/15 1341  BP: 137/84  Pulse: 102  Temp: 98.2 F (36.8 C)         Assessment & Plan:  See problem based a&p.

## 2015-07-12 ENCOUNTER — Telehealth: Payer: Self-pay | Admitting: *Deleted

## 2015-07-12 NOTE — Telephone Encounter (Signed)
Pharm calls and states the only way pt's insurance will pay for liraglutide is to do PA

## 2015-07-13 NOTE — Progress Notes (Signed)
Internal Medicine Clinic Attending  Case discussed with Dr. Ahmed at the time of the visit.  We reviewed the resident's history and exam and pertinent patient test results.  I agree with the assessment, diagnosis, and plan of care documented in the resident's note. 

## 2015-07-19 ENCOUNTER — Other Ambulatory Visit: Payer: Self-pay | Admitting: *Deleted

## 2015-07-19 ENCOUNTER — Other Ambulatory Visit: Payer: Self-pay | Admitting: Internal Medicine

## 2015-07-19 DIAGNOSIS — E1165 Type 2 diabetes mellitus with hyperglycemia: Secondary | ICD-10-CM

## 2015-07-19 DIAGNOSIS — IMO0002 Reserved for concepts with insufficient information to code with codable children: Secondary | ICD-10-CM

## 2015-07-19 DIAGNOSIS — E118 Type 2 diabetes mellitus with unspecified complications: Principal | ICD-10-CM

## 2015-07-20 ENCOUNTER — Telehealth: Payer: Self-pay

## 2015-07-20 NOTE — Telephone Encounter (Signed)
Please call pt back.

## 2015-07-20 NOTE — Telephone Encounter (Signed)
Prior auth needed for Victoza due to month supply being 60 days.  Spoke with Ulis Rias- she will look into this with Medicaid

## 2015-07-20 NOTE — Telephone Encounter (Signed)
Received message that pt is still unable to obtain his Victoza. Upon further review of chart, victoza is non-preferred, the preferred medications are Byetta Pen, Bydureon Pen/Vial, and Tanzeum Pen injector. The preferred will still need a PA stating patient had trial and failure or had insignificant response to metformin containing products. Pt was contacted and made aware of the delay.  Will send info to pcp for medication change and review.Despina Hidden Cassady4/11/20174:30 PM

## 2015-07-21 MED ORDER — GLUCOSE BLOOD VI STRP: 1.0000 | ORAL_STRIP | Freq: Three times a day (TID) | Status: DC

## 2015-08-02 ENCOUNTER — Encounter: Payer: Medicaid Other | Admitting: Internal Medicine

## 2015-08-03 ENCOUNTER — Other Ambulatory Visit: Payer: Self-pay | Admitting: Internal Medicine

## 2015-08-03 ENCOUNTER — Encounter: Payer: Self-pay | Admitting: Internal Medicine

## 2015-08-10 ENCOUNTER — Other Ambulatory Visit: Payer: Self-pay | Admitting: Internal Medicine

## 2015-08-11 ENCOUNTER — Encounter: Payer: Self-pay | Admitting: Internal Medicine

## 2015-08-17 ENCOUNTER — Other Ambulatory Visit: Payer: Self-pay | Admitting: Internal Medicine

## 2015-08-26 ENCOUNTER — Encounter: Payer: Self-pay | Admitting: Internal Medicine

## 2015-08-26 ENCOUNTER — Ambulatory Visit (INDEPENDENT_AMBULATORY_CARE_PROVIDER_SITE_OTHER): Payer: Medicaid Other | Admitting: Internal Medicine

## 2015-08-26 VITALS — BP 172/106 | HR 99 | Temp 98.2°F | Wt 196.1 lb

## 2015-08-26 DIAGNOSIS — F329 Major depressive disorder, single episode, unspecified: Secondary | ICD-10-CM | POA: Diagnosis not present

## 2015-08-26 DIAGNOSIS — E1140 Type 2 diabetes mellitus with diabetic neuropathy, unspecified: Secondary | ICD-10-CM

## 2015-08-26 DIAGNOSIS — E1165 Type 2 diabetes mellitus with hyperglycemia: Secondary | ICD-10-CM

## 2015-08-26 DIAGNOSIS — F32A Depression, unspecified: Secondary | ICD-10-CM

## 2015-08-26 DIAGNOSIS — Z794 Long term (current) use of insulin: Secondary | ICD-10-CM | POA: Diagnosis not present

## 2015-08-26 DIAGNOSIS — Z79899 Other long term (current) drug therapy: Secondary | ICD-10-CM | POA: Diagnosis not present

## 2015-08-26 DIAGNOSIS — M25512 Pain in left shoulder: Secondary | ICD-10-CM | POA: Diagnosis present

## 2015-08-26 DIAGNOSIS — I10 Essential (primary) hypertension: Secondary | ICD-10-CM | POA: Diagnosis not present

## 2015-08-26 DIAGNOSIS — E118 Type 2 diabetes mellitus with unspecified complications: Principal | ICD-10-CM

## 2015-08-26 DIAGNOSIS — IMO0002 Reserved for concepts with insufficient information to code with codable children: Secondary | ICD-10-CM

## 2015-08-26 LAB — POCT GLYCOSYLATED HEMOGLOBIN (HGB A1C): Hemoglobin A1C: 10.6

## 2015-08-26 LAB — GLUCOSE, CAPILLARY: Glucose-Capillary: 134 mg/dL — ABNORMAL HIGH (ref 65–99)

## 2015-08-26 MED ORDER — DICLOFENAC SODIUM 1 % TD GEL: 4.0000 g | Freq: Four times a day (QID) | TRANSDERMAL | Status: DC

## 2015-08-26 MED ORDER — METFORMIN HCL 1000 MG PO TABS: 1000.0000 mg | ORAL_TABLET | Freq: Two times a day (BID) | ORAL | Status: DC

## 2015-08-26 MED ORDER — AMLODIPINE BESYLATE 10 MG PO TABS: 10.0000 mg | ORAL_TABLET | Freq: Every day | ORAL | Status: AC

## 2015-08-26 NOTE — Progress Notes (Signed)
   Subjective:    Patient ID: [name], male    DOB: [birth year], 50 y.o.   MRN: [id number]  HPI  [name] is a 50 year old man with a PMH of uncontrolled diabetes, diabetic neuropathy, s/p bilateral BKA, depression who comes to the clinic to discuss his diabetes, HTN, left shoulder pain, depression, and diabetic neuropathy.  The patient has not been taking his insulin as prescribed. He is taking 18U lantus in the AM and 20U in the evening. He is also rarely taking his sliding scale novolog. He has, however, been taking his metformin 1000 mg daily. He has not been using his glucometer since it "cracked," but he is obtaining a new one today. He endorses polyuria and polydipsia, denies polyphagia. He denies any episodes of symptomatic hypoglycemia.  The patient has not been taking his prescribed amlodipine 10 mg. He denies any headache for vision changes.   He complains of left shoulder and neck pain that he describes as a "crick" in his neck. He woke up with it one day. He denies any numbness or tingling in his right arm.  He does, however, have numbness in his right arm. He has been taking Gabapentin for it and was prescribed amitriptyline for it during his last clinic visit. He says only picked amitriptyline recently for it.  The patient endorses symptoms of depression, reduced sleep, lost interest, guilt, low appetite for at least a month now. He was previously prescribe fluoxetine but he has not taken it in a while. He denies active SI, but says sometimes he wishes he didn't wake up.   Review of Systems  Constitutional: Negative for fever and fatigue.  HENT: Negative for congestion and sore throat.   Eyes: Negative for redness, itching and visual disturbance.  Respiratory: Negative for cough and shortness of breath.   Cardiovascular: Negative for chest pain and palpitations.  Gastrointestinal: Negative for nausea, diarrhea and constipation.  Endocrine: Positive for polydipsia and  polyuria. Negative for polyphagia.  Genitourinary: Positive for urgency and frequency. Negative for hematuria.  Musculoskeletal: Positive for arthralgias. Negative for back pain and joint swelling.  Skin: Negative for rash and wound.  Neurological: Negative for dizziness and headaches.  Psychiatric/Behavioral: Positive for sleep disturbance, dysphoric mood, decreased concentration and agitation. Negative for behavioral problems and confusion. The patient is not nervous/anxious.        Objective:   Physical Exam  Constitutional: He appears well-developed. No distress.  HENT:  Mouth/Throat: Oropharynx is clear and moist. No oropharyngeal exudate.  Eyes: Conjunctivae are normal. No scleral icterus.  Neck: Neck supple.  Cardiovascular: Normal rate, regular rhythm and normal heart sounds.   Pulmonary/Chest: Effort normal and breath sounds normal. No respiratory distress. He has no wheezes.  Abdominal: Soft. Bowel sounds are normal. He exhibits no distension. There is no tenderness.  Musculoskeletal:  Limited ROM of left shoulder and neck. Lower extremity BKA.  Lymphadenopathy:    He has no cervical adenopathy.  Neurological:  5/5 strength in upper extremities. Normal light touch sensation in upper extremities. FTN normal. Tongue midline, face symmetric. Speech Normal.  Skin: Skin is warm and dry.  Psychiatric:  Bright affect. Says mood is "sometimes good, sometimes bad."  Vitals reviewed.         Assessment & Plan:   Please see problem based assessment and plan for details.

## 2015-08-26 NOTE — Patient Instructions (Signed)
[name],  It was GREAT to meet you today.  For your diabetes, please take the Lantus 22U twice a day as prescribed. We are also increasing your Metformin from 1000 mg in the morning to 1000 mg in the morning AND evening with meals. For the 500 mg tablet you have now, you can take two tablets in the morning and evening. Your next prescription should be 1000 mg, so then you would take 1 tablet in the AM and PM. You diabetes is improved but still needs improvement.   We will continue to use the Amitryptiline (Elavil) 50 mg daily for both your right hand numbness and for your mood. It can take up to 4 weeks to see effects on mood. If this ineffective, we re-explore restarted Prozac.  For your left shoulder pain we are prescribing Voltaren Gel.   Your blood pressure is high today. This is because the amlodipine (Norvasc) hasn't been finding its way to your mouth. Please continue to take this as well as your lisinopril.  We'll see you in one month.

## 2015-08-26 NOTE — Assessment & Plan Note (Signed)
A: Patient has not yet taken amitriptyline but has it in hand today. He continues to have paresthesias, burning in his right hand.  P: Gabapentin 600 mg TID Amitriptyline 50 mg daily

## 2015-08-26 NOTE — Progress Notes (Signed)
Medicine attending: Medical history, presenting problems, physical findings, and medications, reviewed with resident physician Dr  Jeremy Ford on the day of the patient visit and I concur with his evaluation and management plan. 

## 2015-08-26 NOTE — Assessment & Plan Note (Signed)
A: Likely represents a tendinitis  P: Voltaren gel

## 2015-08-26 NOTE — Assessment & Plan Note (Signed)
A: Blood pressured elevated to 172/106 today, he as at goal during last visit at 137/84. Patient not taking his amlodipine. He denies headache, confusion, and vision changes  P:  Restart amlodipine 10 mg daily Continue Lisnipril 20 mg daily

## 2015-08-26 NOTE — Assessment & Plan Note (Signed)
A: A1c improved but still far above goal at 10.6 today. Patient unable to start Victoza at this time due to prior authorization issues. We will maximize his metformin therapy and revisit Victoza if this has been shown to be ineffective. He says he will definitely bring his meter the next visit. I informed him to take Lantus + SSI as previously prescribed.   P: Lantus 22U BID + SSI Metformin increased from 1000 mg daily to 1000 mg BID F/u in 1 month

## 2015-08-26 NOTE — Assessment & Plan Note (Signed)
A: I am hesitant to restart fluoxetine as he will be starting a therapeutic dose of amitryptiline. I informed him that it will take up to 4 weeks to have an effect. If this is ineffective, we can revisit starting an SSRI  P: Amitriptyline 50 mg daily

## 2015-09-16 ENCOUNTER — Telehealth: Payer: Self-pay | Admitting: Licensed Clinical Social Worker

## 2015-09-16 NOTE — Telephone Encounter (Signed)
CSW received call from Ms. [name] requesting letter for DSS indicating she is the primary caregiver for [name].  This letter was last provided to pt by CSW on 08/27/2014.  After chart review, CSW attempted to contact [name] to confirm information provided in 2016 is still valid, no response and unable to leave message. CSW placed call to Ms. [name] and requested Ms. Benavidez to have patient contact this worker to confirm services are still provided.

## 2015-09-18 ENCOUNTER — Other Ambulatory Visit: Payer: Self-pay | Admitting: Internal Medicine

## 2015-09-20 NOTE — Telephone Encounter (Signed)
CSW has not received response back from pt to confirm services are provided.  CSW will close and discuss once [name] contacts this Insurance underwriter.

## 2015-09-27 ENCOUNTER — Encounter: Payer: Self-pay | Admitting: Internal Medicine

## 2015-09-27 ENCOUNTER — Ambulatory Visit: Payer: Medicaid Other | Admitting: Internal Medicine

## 2015-09-28 ENCOUNTER — Telehealth: Payer: Self-pay | Admitting: Licensed Clinical Social Worker

## 2015-09-28 NOTE — Telephone Encounter (Signed)
CSW receiving incoming call from Mr. Popwell.  "I didn't know where to start.  So, I called you."  Mr. Tezak recently released from incarceration.  During his incarceration, pt lost "everything".  He has separated from his spouse and does not have housing.  Mr. Ackers inquiring about [facility] placement, stating he can no live independently, even with [facility].  CSW informed Mr. Reddoch of the option of using his Medicaid benefit for [facility] placement, however, it would not be immediate due to finding a place and completing necessary paperwork.  Mr. Reh requesting to meet one-on-one with CSW.  Pt plans to come to [facility] on 10/04/15 at 10am to obtain listing and discuss placement with CSW.

## 2015-10-04 ENCOUNTER — Ambulatory Visit: Payer: Medicaid Other | Admitting: Internal Medicine

## 2015-10-05 ENCOUNTER — Other Ambulatory Visit: Payer: Self-pay | Admitting: Dietician

## 2015-10-05 ENCOUNTER — Encounter: Payer: Self-pay | Admitting: Licensed Clinical Social Worker

## 2015-10-05 DIAGNOSIS — E118 Type 2 diabetes mellitus with unspecified complications: Principal | ICD-10-CM

## 2015-10-05 DIAGNOSIS — IMO0002 Reserved for concepts with insufficient information to code with codable children: Secondary | ICD-10-CM

## 2015-10-05 DIAGNOSIS — E1165 Type 2 diabetes mellitus with hyperglycemia: Secondary | ICD-10-CM

## 2015-10-05 DIAGNOSIS — Z794 Long term (current) use of insulin: Principal | ICD-10-CM

## 2015-10-05 MED ORDER — GLUCOSE BLOOD VI STRP: ORAL_STRIP | Status: DC

## 2015-10-05 MED ORDER — ACCU-CHEK FASTCLIX LANCETS MISC: Status: DC

## 2015-10-05 MED ORDER — ACCU-CHEK NANO SMARTVIEW W/DEVICE KIT: PACK | Status: DC
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
## 2015-10-05 NOTE — Telephone Encounter (Signed)
Per social work patient requests prescription for meter be sent to his pharmacy. Can only leave a message on current phone number: [phone]. Left message that if prescription not covered or any other questions or concerns to call me.

## 2015-10-06 ENCOUNTER — Other Ambulatory Visit: Payer: Self-pay | Admitting: Internal Medicine

## 2015-10-07 ENCOUNTER — Other Ambulatory Visit: Payer: Self-pay | Admitting: *Deleted

## 2015-10-07 DIAGNOSIS — IMO0002 Reserved for concepts with insufficient information to code with codable children: Secondary | ICD-10-CM

## 2015-10-07 DIAGNOSIS — Z794 Long term (current) use of insulin: Principal | ICD-10-CM

## 2015-10-07 DIAGNOSIS — E1165 Type 2 diabetes mellitus with hyperglycemia: Secondary | ICD-10-CM

## 2015-10-07 DIAGNOSIS — E118 Type 2 diabetes mellitus with unspecified complications: Principal | ICD-10-CM

## 2015-10-08 MED ORDER — ACCU-CHEK NANO SMARTVIEW W/DEVICE KIT: PACK | Status: DC

## 2015-10-14 ENCOUNTER — Telehealth: Payer: Self-pay | Admitting: *Deleted

## 2015-10-14 NOTE — Telephone Encounter (Addendum)
Received fax from pharmacy requesting a change from pt. Current PPI (Protonix) to Zegrid for insurance coverage. Please advise. Thanks!

## 2015-10-18 ENCOUNTER — Encounter: Payer: Self-pay | Admitting: Internal Medicine

## 2015-10-18 ENCOUNTER — Ambulatory Visit (INDEPENDENT_AMBULATORY_CARE_PROVIDER_SITE_OTHER): Payer: Medicaid Other | Admitting: Internal Medicine

## 2015-10-18 VITALS — BP 118/69 | HR 100 | Temp 98.0°F | Wt 185.9 lb

## 2015-10-18 DIAGNOSIS — Z794 Long term (current) use of insulin: Secondary | ICD-10-CM | POA: Diagnosis not present

## 2015-10-18 DIAGNOSIS — Z026 Encounter for examination for insurance purposes: Secondary | ICD-10-CM

## 2015-10-18 DIAGNOSIS — G546 Phantom limb syndrome with pain: Secondary | ICD-10-CM | POA: Diagnosis not present

## 2015-10-18 DIAGNOSIS — Z89512 Acquired absence of left leg below knee: Secondary | ICD-10-CM

## 2015-10-18 DIAGNOSIS — Z993 Dependence on wheelchair: Secondary | ICD-10-CM

## 2015-10-18 DIAGNOSIS — E1140 Type 2 diabetes mellitus with diabetic neuropathy, unspecified: Secondary | ICD-10-CM

## 2015-10-18 DIAGNOSIS — Z89511 Acquired absence of right leg below knee: Secondary | ICD-10-CM

## 2015-10-18 MED ORDER — OMEPRAZOLE-SODIUM BICARBONATE 20-1100 MG PO CAPS: 1.0000 | ORAL_CAPSULE | Freq: Every day | ORAL | Status: DC

## 2015-10-18 NOTE — Assessment & Plan Note (Signed)
Patient is here for evaluation of power wheelchair, and has bilateral BKA. On exam, He has 3/5 strength in UE. Pt has bilateral BKA, and so he is not able to use a cane or a walker.  Pt has diabetic neuropathy in both hands and also phantom limb pain.  Therefore, he is not able to use a manual wheelchair. Patient also has difficulty transferring from the chair to the bed, and vice versa, and so he is not an appropriate candidate for an electric scooter. He mentioned that currently it is difficult for him to ambulate to the kitchen, and to the bathroom.  Patient has physical and mental ability to operate a power wheelchair safely in his home, and is willing and motivated to use the power wheelchair.   Based on my exam, he qualifies for a power wheel chair and this will help him do more of his ADLs.

## 2015-10-18 NOTE — Telephone Encounter (Signed)
Changed to zegerid thx

## 2015-10-18 NOTE — Patient Instructions (Signed)
Thank you for your visit today Based on my assessment, yo qualify for a power wheelchair Please follow up in 2-3 months for your routine check up

## 2015-10-18 NOTE — Progress Notes (Signed)
Patient ID: [name], male   DOB: 1965-05-01, 50 y.o.   MRN: [id number]    CC: power wheel chair assessment HPI: [name] is a 50 y.o. man with PMH noted below who is here for power wheel chair assessment and mobility examination.   Please see Problem List/A&P for the status of the patient's chronic medical problems   Past Medical History  Diagnosis Date  . Onychomycosis   . Hypertension   . Diabetic peripheral neuropathy ([place])   . Diabetic foot ulcer ([place])     s/p Right first ray amputation, left transmetatarsal amputation with revision  . History of drug abuse     Cocaine and marijuana  . Exposure to trichomonas     treated empirically  . Cellulitis of left foot 03/2008    left 4th and 5th metatarsal area  . Hyperlipidemia   . 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 Mallory [name] tear April 2009  . History of prolonged Q-T interval on ECG   . 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  . 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 CKD (chronic kidney disease) stage 2, GFR 60-89 ml/min     BL SCr 1.3-1.4  . Depression   . Peripheral vascular disease ([place])   . Type 2 diabetes mellitus, uncontrolled, with renal complications ([place])   . GERD (gastroesophageal reflux disease)   . Hx of right BKA ([place]) 09/13/2012     Due to severe wound infection with sepsis  . Recurrent Foot Osteomyelitis 02/19/2012    Recurrent osteomyelitis of toes with multiple ray amputations Follows up with Dr [name]   . S/P bilateral BKA (below knee amputation) ([place]) 12/12/13    L BKA  . Cellulitis and abscess of leg 01/31/2014  . Osteomyelitis of left leg ([place]) 01/21/2014  . History of left below knee amputation ([place]) 01/21/2014    Performed on    . Status post below knee amputation of right lower  extremity ([place]) 03/23/2014    Review of Systems:  Has upper extremity weakness Has bilateral neuropathy and tingling in hands.    Physical Exam: Filed Vitals:   10/18/15 1606  BP: 118/69  Pulse: 100  Temp: 98 F (36.7 C)  TempSrc: Oral  Weight: 185 lb 14.4 oz (84.324 kg)  SpO2: 98%    General: A&O, in NAD. Weight 185 lbs, in manual wheelchair today  CV: RRR Resp: CTAB Motor exam: 3/5 strength in upper extremities Extremities: Bilateral BKA and wearing prosthesis today         Assessment & Plan:   See encounters tab for problem based medical decision making. Patient discussed with Dr. Angelia [name]

## 2015-10-19 NOTE — Progress Notes (Signed)
Internal Medicine Clinic Attending  Case discussed with Dr. Saraiya soon after the resident saw the patient.  We reviewed the resident's history and exam and pertinent patient test results.  I agree with the assessment, diagnosis, and plan of care documented in the resident's note.  

## 2015-10-20 ENCOUNTER — Other Ambulatory Visit: Payer: Self-pay

## 2015-10-20 ENCOUNTER — Encounter (HOSPITAL_COMMUNITY): Payer: Self-pay | Admitting: Emergency Medicine

## 2015-10-20 ENCOUNTER — Inpatient Hospital Stay (HOSPITAL_COMMUNITY)
Admission: EM | Admit: 2015-10-20 | Discharge: 2015-10-22 | DRG: 637 | Disposition: A | Discharge status: Home / Self Care | Payer: Medicaid Other | Attending: Internal Medicine | Admitting: Internal Medicine

## 2015-10-20 ENCOUNTER — Other Ambulatory Visit (HOSPITAL_COMMUNITY): Payer: Self-pay

## 2015-10-20 DIAGNOSIS — Z9114 Patient's other noncompliance with medication regimen: Secondary | ICD-10-CM | POA: Diagnosis not present

## 2015-10-20 DIAGNOSIS — IMO0002 Reserved for concepts with insufficient information to code with codable children: Secondary | ICD-10-CM

## 2015-10-20 DIAGNOSIS — Z8719 Personal history of other diseases of the digestive system: Secondary | ICD-10-CM | POA: Diagnosis not present

## 2015-10-20 DIAGNOSIS — N189 Chronic kidney disease, unspecified: Secondary | ICD-10-CM

## 2015-10-20 DIAGNOSIS — E118 Type 2 diabetes mellitus with unspecified complications: Secondary | ICD-10-CM

## 2015-10-20 DIAGNOSIS — N182 Chronic kidney disease, stage 2 (mild): Secondary | ICD-10-CM | POA: Diagnosis present

## 2015-10-20 DIAGNOSIS — Z720 Tobacco use: Secondary | ICD-10-CM | POA: Diagnosis not present

## 2015-10-20 DIAGNOSIS — K861 Other chronic pancreatitis: Secondary | ICD-10-CM | POA: Diagnosis present

## 2015-10-20 DIAGNOSIS — D7589 Other specified diseases of blood and blood-forming organs: Secondary | ICD-10-CM | POA: Diagnosis present

## 2015-10-20 DIAGNOSIS — Z800 Family history of malignant neoplasm of digestive organs: Secondary | ICD-10-CM

## 2015-10-20 DIAGNOSIS — R000 Tachycardia, unspecified: Secondary | ICD-10-CM

## 2015-10-20 DIAGNOSIS — N179 Acute kidney failure, unspecified: Secondary | ICD-10-CM

## 2015-10-20 DIAGNOSIS — K2960 Other gastritis without bleeding: Secondary | ICD-10-CM | POA: Diagnosis present

## 2015-10-20 DIAGNOSIS — E1143 Type 2 diabetes mellitus with diabetic autonomic (poly)neuropathy: Secondary | ICD-10-CM | POA: Diagnosis present

## 2015-10-20 DIAGNOSIS — E875 Hyperkalemia: Secondary | ICD-10-CM | POA: Diagnosis present

## 2015-10-20 DIAGNOSIS — I739 Peripheral vascular disease, unspecified: Secondary | ICD-10-CM | POA: Diagnosis present

## 2015-10-20 DIAGNOSIS — K824 Cholesterolosis of gallbladder: Secondary | ICD-10-CM | POA: Diagnosis present

## 2015-10-20 DIAGNOSIS — E785 Hyperlipidemia, unspecified: Secondary | ICD-10-CM | POA: Diagnosis present

## 2015-10-20 DIAGNOSIS — E861 Hypovolemia: Secondary | ICD-10-CM | POA: Diagnosis present

## 2015-10-20 DIAGNOSIS — K8681 Exocrine pancreatic insufficiency: Secondary | ICD-10-CM | POA: Diagnosis present

## 2015-10-20 DIAGNOSIS — R748 Abnormal levels of other serum enzymes: Secondary | ICD-10-CM

## 2015-10-20 DIAGNOSIS — E1122 Type 2 diabetes mellitus with diabetic chronic kidney disease: Secondary | ICD-10-CM | POA: Diagnosis present

## 2015-10-20 DIAGNOSIS — K210 Gastro-esophageal reflux disease with esophagitis: Secondary | ICD-10-CM | POA: Diagnosis present

## 2015-10-20 DIAGNOSIS — I129 Hypertensive chronic kidney disease with stage 1 through stage 4 chronic kidney disease, or unspecified chronic kidney disease: Secondary | ICD-10-CM | POA: Diagnosis present

## 2015-10-20 DIAGNOSIS — E1165 Type 2 diabetes mellitus with hyperglycemia: Secondary | ICD-10-CM

## 2015-10-20 DIAGNOSIS — E871 Hypo-osmolality and hyponatremia: Secondary | ICD-10-CM | POA: Diagnosis present

## 2015-10-20 DIAGNOSIS — Z905 Acquired absence of kidney: Secondary | ICD-10-CM

## 2015-10-20 DIAGNOSIS — K625 Hemorrhage of anus and rectum: Secondary | ICD-10-CM

## 2015-10-20 DIAGNOSIS — D473 Essential (hemorrhagic) thrombocythemia: Secondary | ICD-10-CM

## 2015-10-20 DIAGNOSIS — F1410 Cocaine abuse, uncomplicated: Secondary | ICD-10-CM | POA: Diagnosis present

## 2015-10-20 DIAGNOSIS — F329 Major depressive disorder, single episode, unspecified: Secondary | ICD-10-CM | POA: Diagnosis present

## 2015-10-20 DIAGNOSIS — K2211 Ulcer of esophagus with bleeding: Secondary | ICD-10-CM | POA: Diagnosis present

## 2015-10-20 DIAGNOSIS — K5900 Constipation, unspecified: Secondary | ICD-10-CM

## 2015-10-20 DIAGNOSIS — Z794 Long term (current) use of insulin: Secondary | ICD-10-CM | POA: Diagnosis not present

## 2015-10-20 DIAGNOSIS — E860 Dehydration: Secondary | ICD-10-CM | POA: Diagnosis present

## 2015-10-20 DIAGNOSIS — K209 Esophagitis, unspecified without bleeding: Secondary | ICD-10-CM | POA: Insufficient documentation

## 2015-10-20 DIAGNOSIS — E1100 Type 2 diabetes mellitus with hyperosmolarity without nonketotic hyperglycemic-hyperosmolar coma (NKHHC): Principal | ICD-10-CM | POA: Diagnosis present

## 2015-10-20 DIAGNOSIS — K3184 Gastroparesis: Secondary | ICD-10-CM

## 2015-10-20 DIAGNOSIS — R739 Hyperglycemia, unspecified: Secondary | ICD-10-CM

## 2015-10-20 DIAGNOSIS — Z79899 Other long term (current) drug therapy: Secondary | ICD-10-CM

## 2015-10-20 DIAGNOSIS — Z89511 Acquired absence of right leg below knee: Secondary | ICD-10-CM

## 2015-10-20 DIAGNOSIS — K2210 Ulcer of esophagus without bleeding: Secondary | ICD-10-CM

## 2015-10-20 DIAGNOSIS — E1142 Type 2 diabetes mellitus with diabetic polyneuropathy: Secondary | ICD-10-CM | POA: Diagnosis present

## 2015-10-20 DIAGNOSIS — F1210 Cannabis abuse, uncomplicated: Secondary | ICD-10-CM

## 2015-10-20 DIAGNOSIS — D649 Anemia, unspecified: Secondary | ICD-10-CM | POA: Diagnosis present

## 2015-10-20 DIAGNOSIS — Z89512 Acquired absence of left leg below knee: Secondary | ICD-10-CM | POA: Diagnosis not present

## 2015-10-20 DIAGNOSIS — K920 Hematemesis: Secondary | ICD-10-CM

## 2015-10-20 DIAGNOSIS — E873 Alkalosis: Secondary | ICD-10-CM | POA: Diagnosis present

## 2015-10-20 DIAGNOSIS — Z833 Family history of diabetes mellitus: Secondary | ICD-10-CM

## 2015-10-20 DIAGNOSIS — R109 Unspecified abdominal pain: Secondary | ICD-10-CM

## 2015-10-20 DIAGNOSIS — D72829 Elevated white blood cell count, unspecified: Secondary | ICD-10-CM

## 2015-10-20 LAB — BLOOD GAS, VENOUS

## 2015-10-20 LAB — COMPREHENSIVE METABOLIC PANEL
ALT: 24 U/L (ref 17–63)
AST: 14 U/L — ABNORMAL LOW (ref 15–41)
Albumin: 3.6 g/dL (ref 3.5–5.0)
Alkaline Phosphatase: 235 U/L — ABNORMAL HIGH (ref 38–126)
Anion gap: 13 (ref 5–15)
BUN: 30 mg/dL — ABNORMAL HIGH (ref 6–20)
CO2: 25 mmol/L (ref 22–32)
Calcium: 10.5 mg/dL — ABNORMAL HIGH (ref 8.9–10.3)
Chloride: 91 mmol/L — ABNORMAL LOW (ref 101–111)
Creatinine, Ser: 1.69 mg/dL — ABNORMAL HIGH (ref 0.61–1.24)
GFR calc Af Amer: 53 mL/min — ABNORMAL LOW (ref >60)
GFR calc non Af Amer: 46 mL/min — ABNORMAL LOW (ref >60)
Glucose, Bld: 879 mg/dL (ref 65–99)
Potassium: 5.4 mmol/L — ABNORMAL HIGH (ref 3.5–5.1)
Sodium: 129 mmol/L — ABNORMAL LOW (ref 135–145)
Total Bilirubin: 0.5 mg/dL (ref 0.3–1.2)
Total Protein: 8.1 g/dL (ref 6.5–8.1)

## 2015-10-20 LAB — BASIC METABOLIC PANEL
Anion gap: 11 (ref 5–15)
BUN: 29 mg/dL — ABNORMAL HIGH (ref 6–20)
CO2: 26 mmol/L (ref 22–32)
Calcium: 10.5 mg/dL — ABNORMAL HIGH (ref 8.9–10.3)
Chloride: 97 mmol/L — ABNORMAL LOW (ref 101–111)
Creatinine, Ser: 1.65 mg/dL — ABNORMAL HIGH (ref 0.61–1.24)
GFR calc Af Amer: 54 mL/min — ABNORMAL LOW (ref >60)
GFR calc non Af Amer: 47 mL/min — ABNORMAL LOW (ref >60)
Glucose, Bld: 671 mg/dL (ref 65–99)
Potassium: 5 mmol/L (ref 3.5–5.1)
Sodium: 134 mmol/L — ABNORMAL LOW (ref 135–145)

## 2015-10-20 LAB — URINALYSIS, ROUTINE W REFLEX MICROSCOPIC
Bilirubin Urine: NEGATIVE
Glucose, UA: >1000 mg/dL — AB
Hgb urine dipstick: NEGATIVE
Ketones, ur: NEGATIVE mg/dL
Leukocytes, UA: NEGATIVE
Nitrite: NEGATIVE
Protein, ur: NEGATIVE mg/dL
Specific Gravity, Urine: 1.028 (ref 1.005–1.030)
pH: 7.5 (ref 5.0–8.0)

## 2015-10-20 LAB — GLUCOSE, CAPILLARY
GLUCOSE-CAPILLARY: 511 mg/dL — AB (ref 65–99)
GLUCOSE-CAPILLARY: 386 mg/dL — AB (ref 65–99)
GLUCOSE-CAPILLARY: 309 mg/dL — AB (ref 65–99)
Glucose-Capillary: 387 mg/dL — ABNORMAL HIGH (ref 65–99)
Glucose-Capillary: 359 mg/dL — ABNORMAL HIGH (ref 65–99)

## 2015-10-20 LAB — I-STAT VENOUS BLOOD GAS, ED
Acid-Base Excess: 6 mmol/L — ABNORMAL HIGH (ref 0.0–2.0)
Bicarbonate: 30.9 mEq/L — ABNORMAL HIGH (ref 20.0–24.0)
O2 Saturation: 77 %
TCO2: 32 mmol/L (ref 0–100)
pCO2, Ven: 42.8 mmHg — ABNORMAL LOW (ref 45.0–50.0)
pH, Ven: 7.466 — ABNORMAL HIGH (ref 7.250–7.300)
pO2, Ven: 39 mmHg (ref 31.0–45.0)

## 2015-10-20 LAB — RAPID URINE DRUG SCREEN, HOSP PERFORMED
Amphetamines: NOT DETECTED
BARBITURATES: NOT DETECTED
Benzodiazepines: NOT DETECTED
Cocaine: POSITIVE — AB
Opiates: NOT DETECTED
Tetrahydrocannabinol: POSITIVE — AB

## 2015-10-20 LAB — GAMMA GT: GGT: 138 U/L — ABNORMAL HIGH (ref 7–50)

## 2015-10-20 LAB — CBC
HCT: 37.8 % — ABNORMAL LOW (ref 39.0–52.0)
Hemoglobin: 12 g/dL — ABNORMAL LOW (ref 13.0–17.0)
MCH: 27.4 pg (ref 26.0–34.0)
MCHC: 31.7 g/dL (ref 30.0–36.0)
MCV: 86.3 fL (ref 78.0–100.0)
Platelets: 553 10*3/uL — ABNORMAL HIGH (ref 150–400)
RBC: 4.38 MIL/uL (ref 4.22–5.81)
RDW: 14.8 % (ref 11.5–15.5)
WBC: 19.2 10*3/uL — ABNORMAL HIGH (ref 4.0–10.5)

## 2015-10-20 LAB — ETHANOL

## 2015-10-20 LAB — TYPE AND SCREEN
ABO/RH(D): O POS
Antibody Screen: NEGATIVE

## 2015-10-20 LAB — POC OCCULT BLOOD, ED: Fecal Occult Bld: NEGATIVE

## 2015-10-20 LAB — MAGNESIUM: Magnesium: 2.4 mg/dL (ref 1.7–2.4)

## 2015-10-20 LAB — LACTIC ACID, PLASMA
LACTIC ACID, VENOUS: 2 mmol/L — AB (ref 0.5–1.9)
Lactic Acid, Venous: 2.1 mmol/L (ref 0.5–1.9)

## 2015-10-20 LAB — URINE MICROSCOPIC-ADD ON

## 2015-10-20 LAB — LIPASE, BLOOD: LIPASE: 42 U/L (ref 11–51)

## 2015-10-20 LAB — I-STAT CG4 LACTIC ACID, ED: LACTIC ACID, VENOUS: 2 mmol/L — AB (ref 0.5–1.9)

## 2015-10-20 LAB — CBG MONITORING, ED
Glucose-Capillary: >600 mg/dL (ref 65–99)
Glucose-Capillary: >600 mg/dL (ref 65–99)
Glucose-Capillary: >600 mg/dL (ref 65–99)

## 2015-10-20 MED ORDER — DEXTROSE-NACL 5-0.45 % IV SOLN: INTRAVENOUS | Status: DC

## 2015-10-20 MED ORDER — GABAPENTIN 600 MG PO TABS
600.0000 mg | ORAL_TABLET | Freq: Three times a day (TID) | ORAL | Status: DC
  Administered 2015-10-20 – 2015-10-22 (×6): 600 mg via ORAL
  Filled 2015-10-20 (×7): qty 1

## 2015-10-20 MED ORDER — SODIUM CHLORIDE 0.9 % IV SOLN: INTRAVENOUS | Status: DC

## 2015-10-20 MED ORDER — ONDANSETRON HCL 4 MG/2ML IJ SOLN
4.0000 mg | Freq: Four times a day (QID) | INTRAMUSCULAR | Status: DC | PRN
  Administered 2015-10-21: 4 mg via INTRAVENOUS
  Filled 2015-10-20 (×2): qty 2

## 2015-10-20 MED ORDER — DEXTROSE-NACL 5-0.45 % IV SOLN
INTRAVENOUS | Status: DC
  Administered 2015-10-21 (×2): via INTRAVENOUS

## 2015-10-20 MED ORDER — ENOXAPARIN SODIUM 40 MG/0.4ML ~~LOC~~ SOLN
40.0000 mg | SUBCUTANEOUS | Status: DC
  Administered 2015-10-20 – 2015-10-21 (×2): 40 mg via SUBCUTANEOUS
  Filled 2015-10-20 (×3): qty 0.4

## 2015-10-20 MED ORDER — POLYETHYLENE GLYCOL 3350 17 G PO PACK
17.0000 g | PACK | Freq: Every day | ORAL | Status: DC
  Administered 2015-10-21 – 2015-10-22 (×2): 17 g via ORAL
  Filled 2015-10-20 (×2): qty 1

## 2015-10-20 MED ORDER — SODIUM CHLORIDE 0.9 % IV BOLUS (SEPSIS)
1000.0000 mL | Freq: Once | INTRAVENOUS | Status: AC
  Administered 2015-10-20: 1000 mL via INTRAVENOUS

## 2015-10-20 MED ORDER — SUCRALFATE 1 GM/10ML PO SUSP: 1.0000 g | Freq: Three times a day (TID) | ORAL | Status: DC

## 2015-10-20 MED ORDER — AMITRIPTYLINE HCL 50 MG PO TABS
50.0000 mg | ORAL_TABLET | Freq: Every day | ORAL | Status: DC
  Administered 2015-10-20 – 2015-10-21 (×2): 50 mg via ORAL
  Filled 2015-10-20 (×4): qty 1

## 2015-10-20 MED ORDER — ONDANSETRON 4 MG PO TBDP
4.0000 mg | ORAL_TABLET | Freq: Once | ORAL | Status: AC
Start: 2015-10-20 — End: 2015-10-20
  Administered 2015-10-20: 4 mg via ORAL
  Filled 2015-10-20: qty 1

## 2015-10-20 MED ORDER — INSULIN REGULAR HUMAN 100 UNIT/ML IJ SOLN
INTRAMUSCULAR | Status: DC
Start: 2015-10-20 — End: 2015-10-21
  Administered 2015-10-20: 3.3 [IU]/h via INTRAVENOUS
  Filled 2015-10-20: qty 2.5

## 2015-10-20 MED ORDER — ACETAMINOPHEN 500 MG PO TABS
500.0000 mg | ORAL_TABLET | Freq: Four times a day (QID) | ORAL | Status: DC | PRN
  Administered 2015-10-20 – 2015-10-21 (×2): 1000 mg via ORAL
  Filled 2015-10-20 (×2): qty 2

## 2015-10-20 MED ORDER — POLYETHYLENE GLYCOL 3350 17 G PO PACK: 17.0000 g | PACK | Freq: Every day | ORAL | Status: DC | PRN

## 2015-10-20 MED ORDER — INSULIN ASPART 100 UNIT/ML ~~LOC~~ SOLN: 5.0000 [IU] | Freq: Once | SUBCUTANEOUS | Status: DC

## 2015-10-20 MED ORDER — AMLODIPINE BESYLATE 10 MG PO TABS
10.0000 mg | ORAL_TABLET | Freq: Every day | ORAL | Status: DC
  Administered 2015-10-20 – 2015-10-22 (×3): 10 mg via ORAL
  Filled 2015-10-20: qty 1
  Filled 2015-10-20: qty 2
  Filled 2015-10-20: qty 1

## 2015-10-20 MED ORDER — SODIUM CHLORIDE 0.9 % IV SOLN
INTRAVENOUS | Status: DC
  Administered 2015-10-20: 5.4 [IU]/h via INTRAVENOUS
  Filled 2015-10-20: qty 2.5

## 2015-10-20 MED ORDER — SENNOSIDES-DOCUSATE SODIUM 8.6-50 MG PO TABS
1.0000 | ORAL_TABLET | Freq: Every day | ORAL | Status: DC
  Administered 2015-10-21 – 2015-10-22 (×2): 1 via ORAL
  Filled 2015-10-20 (×2): qty 1

## 2015-10-20 MED ORDER — LISINOPRIL 20 MG PO TABS
20.0000 mg | ORAL_TABLET | Freq: Every day | ORAL | Status: DC
  Administered 2015-10-20 – 2015-10-22 (×3): 20 mg via ORAL
  Filled 2015-10-20 (×3): qty 1

## 2015-10-20 MED ORDER — ATORVASTATIN CALCIUM 40 MG PO TABS
40.0000 mg | ORAL_TABLET | Freq: Every day | ORAL | Status: DC
  Administered 2015-10-20 – 2015-10-21 (×2): 40 mg via ORAL
  Filled 2015-10-20 (×2): qty 1

## 2015-10-20 MED ORDER — ONDANSETRON HCL 4 MG/2ML IJ SOLN
4.0000 mg | Freq: Once | INTRAMUSCULAR | Status: AC
  Administered 2015-10-20: 4 mg via INTRAVENOUS
  Filled 2015-10-20: qty 2

## 2015-10-20 MED ORDER — SODIUM CHLORIDE 0.9 % IV SOLN
INTRAVENOUS | Status: DC
Start: 2015-10-20 — End: 2015-10-22
  Administered 2015-10-20 (×2): via INTRAVENOUS

## 2015-10-20 MED ORDER — PANTOPRAZOLE SODIUM 40 MG PO TBEC
40.0000 mg | DELAYED_RELEASE_TABLET | Freq: Two times a day (BID) | ORAL | Status: DC
  Administered 2015-10-20 – 2015-10-22 (×4): 40 mg via ORAL
  Filled 2015-10-20 (×4): qty 1

## 2015-10-20 NOTE — ED Notes (Signed)
Phlebotomy at bedside.

## 2015-10-20 NOTE — Progress Notes (Signed)
Admission note:  Arrival Method: Patient arrived on stretcher from ED accompanied by the staff. Mental Orientation:Alert and oriented x 4. Telemetry: 6E22, ST 113, CCMD notified. Assessment: See doc flow sheets. Skin: Intact, warm and dry, bilateral below knee amputation, knot noted on the right forehead, assessed by two nurses (Cy[place]). IV: Left wrist NS 125 ml/hr, continuous insulin as per CBGs, as he is on glucose stabilizer. Pain: c/o pain in the abdomen, given tylenol 1000mg , will continue to monitor and will notify oncoming nurse.   Tubes: N/A Safety Measures: Bed in low position, refused bed alarm, phone and call light within reach. Fall Prevention Safety Plan: Reviewed the plan, understood and acknowledged. Admission Screening: In progress. 6700 Orientation: Patient has been oriented to the unit, staff and to the room.

## 2015-10-20 NOTE — ED Notes (Signed)
IV team at bedside 

## 2015-10-20 NOTE — H&P (Signed)
Date: 10/20/2015               Patient Name:  [name] MRN: [id number]  DOB: 01/15/1966 Age / Sex: 50 y.o., male   PCP: [name], MD         Medical Service: Internal Medicine Teaching Service         Attending Physician: Dr. [name], MD    First Contact: Evert Kohl, MS4 Pager: [phone]  Second Contact: Dr. Randell Patient Pager: [phone]       After Hours (After 5p/  First Contact Pager: [phone]  weekends / holidays): Second Contact Pager: [phone]   Chief Complaint: Vomiting  History of Present Illness: [name] is a 50 year old man with history of HTN, DM2 with peripheral neuropathy, history of diabetic foot ulcer and PVD s/p bilateral BKA, polysubstance abuse, HLD, ulcerative esophagitis, CKD2, depression presenting with vomiting found to have hyperglycemia. The nausea and vomiting started this morning. The emesis looked like charcoal/cofee grounds. He also noted a little bit of red blood too. He has diffuse abdominal pain that feels like burning. Not similar to previous episodes of pancreatitis. He has been constipated for the past 2 days. During his BM this morning, the toilet water was blood tinged. Had similar blood before several months ago. He had an EGD on 09/16/2014 which found erosive esophagitis and was placed on pantoprazole 30m BID. He reports he had a colonoscopy a couple of years ago that was normal.   He has not been checking his blood glucose at home. He takes Lantus 22u BID and metformin with meals. He was recently incarcerated 2 weeks ago, and they did not give him his medications or manage his diabetes. His diabetic supplies had been thrown away at home.  Dyspnea this morning. He has lost weight (196 on 5/18 and 185 now). No confusion, fevers, chills, night sweats. He has blurry vision. He has had a dry cough for the past two days. No chest pain. No diarrhea. No dysuria or hematuria. No rash, bruising. No edema. No new arthralgias or  myalgias. No paresthesias or weakness.  Meds: Current Facility-Administered Medications  Medication Dose Route Frequency Provider Last Rate Last Dose  . 0.9 %  sodium chloride infusion   Intravenous Continuous Shawn C Joy, PA-C      . amLODipine (NORVASC) tablet 10 mg  10 mg Oral Daily Shawn C Joy, PA-C      . dextrose 5 %-0.45 % sodium chloride infusion   Intravenous Continuous Shawn C Joy, PA-C      . insulin regular (NOVOLIN R,HUMULIN R) 250 Units in sodium chloride 0.9 % 250 mL (1 Units/mL) infusion   Intravenous Continuous Shawn C Joy, PA-C      . lisinopril (PRINIVIL,ZESTRIL) tablet 20 mg  20 mg Oral Daily Shawn C Joy, PA-C      . ondansetron (ZOFRAN-ODT) disintegrating tablet 4 mg  4 mg Oral Once Shawn C Joy, PA-C       Current Outpatient Prescriptions  Medication Sig Dispense Refill  . ACCU-CHEK FASTCLIX LANCETS MISC Check blood sugar before meals and bedtime 102 each 11  . amitriptyline (ELAVIL) 50 MG tablet Take 1 tablet (50 mg total) by mouth at bedtime. 30 tablet 1  . amLODipine (NORVASC) 10 MG tablet Take 1 tablet (10 mg total) by mouth daily. 30 tablet 5  . aspirin 81 MG chewable tablet Chew 1 tablet (81 mg total) by mouth daily. (Patient not taking: Reported on  10/18/2015) 30 tablet 3  . atorvastatin (LIPITOR) 40 MG tablet Take 1 tablet (40 mg total) by mouth daily at 6 PM. 30 tablet 3  . Blood Glucose Monitoring Suppl (ACCU-CHEK NANO SMARTVIEW) w/Device KIT Check blood sugar before meals and bedtime 1 kit 0  . diclofenac sodium (VOLTAREN) 1 % GEL Apply 4 g topically 4 (four) times daily. 1 Tube 5  . EASY COMFORT INSULIN SYRINGE 31G X 5/16" 1 ML MISC Use as directed daily for insulin injection.  99  . EASY TOUCH PEN NEEDLES 29G X 12MM MISC Use to inject victoza daily. 100 each 3  . feeding supplement, GLUCERNA SHAKE, (GLUCERNA SHAKE) LIQD Take 237 mLs by mouth 2 (two) times daily between meals. 60 Can 3  . ferrous sulfate 325 (65 FE) MG tablet Take 1 tablet (325 mg total) by  mouth daily with breakfast. 90 tablet 3  . folic acid (FOLVITE) 1 MG tablet Take 1 tablet (1 mg total) by mouth daily. 30 tablet 3  . gabapentin (NEURONTIN) 600 MG tablet Take 1 tablet (600 mg total) by mouth 3 (three) times daily. 90 tablet 6  . glucose blood (ACCU-CHEK SMARTVIEW) test strip Check blood sugar before meals and bedtime 360 each 3  . insulin aspart (NOVOLOG) 100 UNIT/ML injection Inject 0-15 Units into the skin 3 (three) times daily with meals. 10 mL 11  . insulin glargine (LANTUS) 100 UNIT/ML injection Inject 0.22 mLs (22 Units total) into the skin 2 (two) times daily. 10 mL 4  . lisinopril (PRINIVIL,ZESTRIL) 20 MG tablet Take 1 tablet (20 mg total) by mouth daily. 30 tablet 1  . metFORMIN (GLUCOPHAGE) 1000 MG tablet Take 1 tablet (1,000 mg total) by mouth 2 (two) times daily with a meal. 30 tablet 11  . Omeprazole-Sodium Bicarbonate (ZEGERID) 20-1100 MG CAPS capsule Take 1 capsule by mouth daily before breakfast. 28 each 3  . polyethylene glycol (MIRALAX / GLYCOLAX) packet MIX 1 PACKET INTO 8 OUNCES OF LIQUID DAILY AS NEEDED 14 packet 4  . senna-docusate (SENOKOT-S) 8.6-50 MG tablet Take 1 tablet by mouth daily. 30 tablet 1  . sildenafil (REVATIO) 20 MG tablet Take 2-5 tablets before sexual activity. 50 tablet 1  . sucralfate (CARAFATE) 1 GM/10ML suspension Take 10 mLs (1 g total) by mouth 4 (four) times daily -  with meals and at bedtime. 3600 mL 1    Allergies: Allergies as of 10/20/2015  . (No Known Allergies)   Past Medical History  Diagnosis Date  . Onychomycosis   . Hypertension   . Diabetic peripheral neuropathy ([place])   . Diabetic foot ulcer ([place])     s/p Right first ray amputation, left transmetatarsal amputation with revision  . History of drug abuse     Cocaine and marijuana  . Exposure to trichomonas     treated empirically  . Cellulitis of left foot 03/2008    left 4th and 5th metatarsal area  . Hyperlipidemia   . Alcoholic pancreatitis 73/4287  .  Ulcerative esophagitis 6811    severe, complicated with UGI bleed  . Mallory [name] tear April 2009  . History of prolonged Q-T interval on ECG   . 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  . 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 CKD (chronic kidney disease) stage 2, GFR 60-89 ml/min     BL SCr 1.3-1.4  .  Depression   . Peripheral vascular disease ([place])   . Type 2 diabetes mellitus, uncontrolled, with renal complications ([place])   . GERD (gastroesophageal reflux disease)   . Hx of right BKA ([place]) 09/13/2012     Due to severe wound infection with sepsis  . 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 S/P bilateral BKA (below knee amputation) ([place]) 12/12/13    L BKA  . Cellulitis and abscess of leg 01/31/2014  . Osteomyelitis of left leg ([place]) 01/21/2014  . History of left below knee amputation ([place]) 01/21/2014    Performed on    . Status post below knee amputation of right lower extremity ([place]) 03/23/2014    Family History: Mother had diabetes and father had pancreatitic cancer  Social History: Uses 1/2 ppd of tobacco since age 64. Occasional wine. Had THC and cocaine in the last few days. Not interested in quitting right now. 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 Lives at home with wife who helps him with his care.  Review of Systems: A complete ROS was negative except as per HPI.   Physical Exam: Blood pressure 183/111, pulse 113, temperature 97.8 F (36.6 C), temperature source Oral, resp. rate 23, height 5' 8"  (1.727 m), weight 185 lb (83.915 kg), SpO2 99 %. General Apperance: NAD Head: Normocephalic, atraumatic Eyes: PERRL, EOMI, anicteric sclera Ears: Normal external ear canal Nose: Nares normal, septum midline, mucosa  normal Throat: Dry mucous membranes Neck: Supple, trachea midline Back: No tenderness or bony abnormality  Lungs: Clear to auscultation bilaterally. No wheezes, rhonchi or rales. Breathing comfortably Chest Wall: Nontender, no deformity Heart: Regular rate and rhythm, no murmur/rub/gallop Abdomen: Soft, nontender, nondistended, no rebound/guarding Extremities: Warm and well perfused, no edema. Bilateral BKA. Pulses: 2+ throughout Skin: No rashes or lesions Neurologic: Alert and oriented x 3. CNII-XII intact. Normal strength and sensation   EKG: Sinus tachycardia, left axis deviation, peaked T waves. T wave inverson in aVL seen previously. No changes otherwise compared to previous EKG on 2/22.  Assessment & Plan by Problem: 50 year old man with history of HTN, DM2 with peripheral neuropathy, history of diabetic foot ulcer and PVD s/p bilateral BKA, polysubstance abuse, HLD, ulcerative esophagitis, CKD2, depression presenting with vomiting found to have hyperglycemia.  HHS, AKI on CKD2: History of uncontrolled diabetes with hospitalizations before for DKA. Recently incarcerated and was not given diabetes medications. Last A1c was 10.6 on 5/18. Afebrile but tachycardic with HR ranging from 102-119. No hypotension. Potassium of 5.4. CO2 within normal limits. Cr above baseline of 1.5 at 1.69 on admission. Glucose of 879. Leukocytosis to 19.2. UA with negative ketones. -Recheck lactic acid -DKA protocol with serial BMP, insulin gtt -NS@125 . Change to D5 1/2 NS @ 125 when CBG less than 250 -NPO -Hold home metformin -Once serum glucose reaches 250-300 and patient remains alert, start carb mod diet and start 26u of Lantus, SSI. Discontinue insulin gtt after 2 hours.  -Continue home amitripyline and gabapentin for peripheral neuropathy  Erosive esophagitis: History of gastroparesis. Hemodynamically stable. His Hgb is above his baseline. FOBT negative. BUN elevated but in the setting of HHS,  hypovolemia. Likely has medication noncompliance. -Pantoprazole 54m BID -Consider Reglan if his nausea and vomiting do not improve with improvement of his HHS.  Hypercalcemia: Ca 10.5. Getting NS. Recheck Ca. If still elevated, will check intact PTH.  Elevated alkaline phosphatase: -Check GGT  Thrombocytosis:  Plt 553. Possibly reactive. Recheck CBC in AM.  Constipation: Blood in toilet likely from hemorrhoids versus fissures. -Senna-docusate 1 tablet daily -Miralax daily  Chronic anemia: Hgb 12 on admission. Baseline around 9-10. FOBT negative.  HTN: Continue home amlodipine 58m daily and lisinopril 228mdaily.  HLD: Continue home Lipitor.  Polysubstance abuse: UDS positive for cocaine and THC. Not interested in cessation  VTE ppx: Lovenox Code: FULL  Dispo: Admit patient to Inpatient with expected length of stay greater than 2 midnights.  Signed: JeMilagros LollMD 10/20/2015, 3:21 PM  Pager: [phone]

## 2015-10-20 NOTE — ED Notes (Signed)
Attempted to call report

## 2015-10-20 NOTE — ED Notes (Signed)
Notified Joy, PA of critical glucose 879.

## 2015-10-20 NOTE — H&P (Signed)
Date: 10/20/2015               Patient Name:  [name] MRN: [id number]  DOB: 1965/09/04 Age / Sex: 50 y.o., male   PCP: [name], MD              Medical Service: Internal Medicine Teaching Service              Attending Physician: Dr. [name], MD    First Contact: Beverely Pace, MS 4 Pager: [phone]  Second Contact: Dr. Jacques Earthly Pager: [phone]            After Hours (After 5p/  First Contact Pager: [phone]  weekends / holidays): Second Contact Pager: [phone]   Chief Complaint: hematemesis   History of Present Illness: [name] is a 50 yo gentleman with DMII c/b neuropathy, b/l BKA and gastroparesis, CKD stage 2 (baseline Cr 1.3-1.4), GERD with ulcerative esophagitis and history of MW tear, chronic EtOH pancreatitis, depression, HLD, HTN, chronic pyelonephritis with history renal/perinephric abscess 2010 who presents with hematemesis.  Patient awoke earlier this morning when he awoke with severe nausea and vomiting. He notes the vomit was dark like charcoal with small dark pieces and had a little bit of bright red blood at first which has since subsided. He has vomited numerous times since then. He notes the appearance of the vomit is similar to when he had a MW tear. He also notes generalized abdominal pain that he is unable to characterize further with occasional epigastric burning that he has noted over the course of the last week. He notes this feels different than his pain from pancreatitis.   He has also noted constipation over the last week with last BM "when I went up [place] to [place]" which he is unsure when that is but thinks a couple days ago. He is not taking senna or miralax. Patient then noted he had some blood in the toilet bowl turning the water red this morning but does not think there was blood mixed in with the stool and does not think stool was darker than usual. His wife noted to EMS darkened stool on the sheets in the last few days.   He notes he  was incarcerated 1-2 weeks ago and was not receiving all his medication. Since coming home, he has not had the equipment to check his blood sugar. He notes taking metformin 3 times a day with meals, aspart with meals with unknown dosing, lantus 22U bid which he notes he has taken all since coming home. He also notes taking Reglan. He notes he has had trouble controlling his blood sugar in the past and notes feeling similar in the past. He has noted blurry vision, increased thirst and dry mouth, but no polyuria and no confusion. He notes he is 185lbs today and was 210lbs back in December. He notes a nonproductive cough in the last 2 days with dyspnea since this AM. He notes his kidney disease has been stable as an outpatient.   He denies fevers, chills, HA, CP, diarrhea, dysuria, hematuria, arthralgia, myalgia, rashes, bleeding, bruising, numbness, paresthesia, weakness, night sweats.  In the ED: labs notable for Glucose 879, Na 129, K 5.4, BUN 30, Cr 1.69, Alk Phos 235, Alb, AST, ALT, TBili, lipase all WNL. WBC 19.2, Hgb 12, PLT 553, UA no ketones, >1000 glucose, VBG 7.46/42.8/31, FOBT negative, Cocaine and THC positive.   Meds: Current Facility-Administered Medications  Medication Dose Route Frequency Provider Last Rate Last  Dose  . 0.9 %  sodium chloride infusion   Intravenous Continuous Shawn C Joy, PA-C      . amLODipine (NORVASC) tablet 10 mg  10 mg Oral Daily Shawn C Joy, PA-C   10 mg at 10/20/15 1550  . dextrose 5 %-0.45 % sodium chloride infusion   Intravenous Continuous Shawn C Joy, PA-C      . insulin regular (NOVOLIN R,HUMULIN R) 250 Units in sodium chloride 0.9 % 250 mL (1 Units/mL) infusion   Intravenous Continuous Shawn C Joy, PA-C 5.4 mL/hr at 10/20/15 1551 5.4 Units/hr at 10/20/15 1551  . lisinopril (PRINIVIL,ZESTRIL) tablet 20 mg  20 mg Oral Daily Shawn C Joy, PA-C   20 mg at 10/20/15 1550   Current Outpatient Prescriptions  Medication Sig Dispense Refill  . ACCU-CHEK FASTCLIX  LANCETS MISC Check blood sugar before meals and bedtime 102 each 11  . amitriptyline (ELAVIL) 50 MG tablet Take 1 tablet (50 mg total) by mouth at bedtime. 30 tablet 1  . amLODipine (NORVASC) 10 MG tablet Take 1 tablet (10 mg total) by mouth daily. 30 tablet 5  . aspirin 81 MG chewable tablet Chew 1 tablet (81 mg total) by mouth daily. (Patient not taking: Reported on 10/18/2015) 30 tablet 3  . atorvastatin (LIPITOR) 40 MG tablet Take 1 tablet (40 mg total) by mouth daily at 6 PM. 30 tablet 3  . Blood Glucose Monitoring Suppl (ACCU-CHEK NANO SMARTVIEW) w/Device KIT Check blood sugar before meals and bedtime 1 kit 0  . diclofenac sodium (VOLTAREN) 1 % GEL Apply 4 g topically 4 (four) times daily. 1 Tube 5  . EASY COMFORT INSULIN SYRINGE 31G X 5/16" 1 ML MISC Use as directed daily for insulin injection.  99  . EASY TOUCH PEN NEEDLES 29G X 12MM MISC Use to inject victoza daily. 100 each 3  . feeding supplement, GLUCERNA SHAKE, (GLUCERNA SHAKE) LIQD Take 237 mLs by mouth 2 (two) times daily between meals. 60 Can 3  . ferrous sulfate 325 (65 FE) MG tablet Take 1 tablet (325 mg total) by mouth daily with breakfast. 90 tablet 3  . folic acid (FOLVITE) 1 MG tablet Take 1 tablet (1 mg total) by mouth daily. 30 tablet 3  . gabapentin (NEURONTIN) 600 MG tablet Take 1 tablet (600 mg total) by mouth 3 (three) times daily. 90 tablet 6  . glucose blood (ACCU-CHEK SMARTVIEW) test strip Check blood sugar before meals and bedtime 360 each 3  . insulin aspart (NOVOLOG) 100 UNIT/ML injection Inject 0-15 Units into the skin 3 (three) times daily with meals. 10 mL 11  . insulin glargine (LANTUS) 100 UNIT/ML injection Inject 0.22 mLs (22 Units total) into the skin 2 (two) times daily. 10 mL 4  . lisinopril (PRINIVIL,ZESTRIL) 20 MG tablet Take 1 tablet (20 mg total) by mouth daily. 30 tablet 1  . metFORMIN (GLUCOPHAGE) 1000 MG tablet Take 1 tablet (1,000 mg total) by mouth 2 (two) times daily with a meal. 30 tablet 11  .  Omeprazole-Sodium Bicarbonate (ZEGERID) 20-1100 MG CAPS capsule Take 1 capsule by mouth daily before breakfast. 28 each 3  . polyethylene glycol (MIRALAX / GLYCOLAX) packet MIX 1 PACKET INTO 8 OUNCES OF LIQUID DAILY AS NEEDED 14 packet 4  . senna-docusate (SENOKOT-S) 8.6-50 MG tablet Take 1 tablet by mouth daily. 30 tablet 1  . sildenafil (REVATIO) 20 MG tablet Take 2-5 tablets before sexual activity. 50 tablet 1  . sucralfate (CARAFATE) 1 GM/10ML suspension Take 10 mLs (1  g total) by mouth 4 (four) times daily -  with meals and at bedtime. 3600 mL 1    Allergies: Allergies as of 10/20/2015  . (No Known Allergies)   Past Medical History  Diagnosis Date  . Onychomycosis   . Hypertension   . Diabetic peripheral neuropathy ([place])   . Diabetic foot ulcer ([place])     s/p Right first ray amputation, left transmetatarsal amputation with revision  . History of drug abuse     Cocaine and marijuana  . Exposure to trichomonas     treated empirically  . Cellulitis of left foot 03/2008    left 4th and 5th metatarsal area  . Hyperlipidemia   . 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 Ulcerative esophagitis 2355    severe, complicated with UGI bleed  . Mallory [name] tear April 2009  . History of prolonged Q-T interval on ECG   . 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  . 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 CKD (chronic kidney disease) stage 2, GFR 60-89 ml/min     BL SCr 1.3-1.4  . Depression   . Peripheral vascular disease ([place])   . Type 2 diabetes mellitus, uncontrolled, with renal complications ([place])   . GERD (gastroesophageal reflux disease)   . Hx of right BKA ([place]) 09/13/2012     Due to severe wound infection with sepsis  . Recurrent Foot Osteomyelitis 02/19/2012    Recurrent osteomyelitis of toes with multiple ray amputations Follows up with Dr Sharol Given    . S/P bilateral BKA (below knee amputation) ([place]) 12/12/13    L BKA  . Cellulitis and abscess of leg 01/31/2014  . Osteomyelitis of left leg ([place]) 01/21/2014  . History of left below knee amputation ([place]) 01/21/2014    Performed on    . Status post below knee amputation of right lower extremity ([place]) 03/23/2014   Past Surgical History  Procedure Laterality Date  . Toe amputation  06/2011    left; 4th toe  . Nephrectomy      L side  . I&d extremity  08/24/2011    Procedure: IRRIGATION AND DEBRIDEMENT EXTREMITY;  Surgeon: Meredith Pel, MD;  Location: [facility];  Service: Orthopedics;  Laterality: Left;  . Amputation  08/24/2011    Procedure: AMPUTATION RAY;  Surgeon: Meredith Pel, MD;  Location: [facility];  Service: Orthopedics;  Laterality: Left;  left fourth toe Ray Resection  . Amputation  12/05/2011    Procedure: AMPUTATION RAY;  Surgeon: Meredith Pel, MD;  Location: [facility];  Service: Orthopedics;  Laterality: Left;  . I&d extremity  02/18/2012    Procedure: IRRIGATION AND DEBRIDEMENT EXTREMITY;  Surgeon: Mcarthur Rossetti, MD;  Location: [facility];  Service: Orthopedics;  Laterality: Left;  . Amputation  02/18/2012    Procedure: AMPUTATION DIGIT;  Surgeon: Mcarthur Rossetti, MD;  Location: [facility];  Service: Orthopedics;  Laterality: Left;  revison transmetatarsal  . I&d extremity Left 08/28/2012    Procedure: IRRIGATION AND DEBRIDEMENT EXTREMITY;  Surgeon: Linna Hoff, MD;  Location: [facility];  Service: Orthopedics;  Laterality: Left;  CYSTO TUBING/IRRIGATION, LEAD HAND.  . I&d extremity Left 08/30/2012    Procedure: IRRIGATION AND DEBRIDEMENT EXTREMITY;  Surgeon: Linna Hoff, MD;  Location: [facility];  Service: Orthopedics;  Laterality: Left;  . Amputation Right 09/01/2012    Procedure: FOOT  1ST RAY AMPUTATION;  Surgeon: Newt Minion, MD;  Location: [facility];  Service: Orthopedics;  Laterality: Right;  . Amputation Left 09/01/2012    Procedure: FOOT REVISION TRANSMETATARSAL  AMPUTATION ;  Surgeon: Newt Minion, MD;  Location: [facility];  Service: Orthopedics;  Laterality: Left;  . Amputation Right 09/13/2012    Procedure: AMPUTATION BELOW KNEE;  Surgeon: Newt Minion, MD;  Location: [facility];  Service: Orthopedics;  Laterality: Right;  Right Below Knee Amputation  . Amputation Left 12/12/2013    Procedure: Left Below Knee Amputation;  Surgeon: Newt Minion, MD;  Location: [facility];  Service: Orthopedics;  Laterality: Left;  . Stump revision Left 01/23/2014    Procedure: STUMP REVISION;  Surgeon: Newt Minion, MD;  Location: [facility];  Service: Orthopedics;  Laterality: Left;  . Esophagogastroduodenoscopy (egd) with propofol N/A 09/16/2014    Procedure: ESOPHAGOGASTRODUODENOSCOPY (EGD) WITH PROPOFOL;  Surgeon: Wonda Horner, MD;  Location: [facility];  Service: Endoscopy;  Laterality: N/A;   Family History  Problem Relation Age of Onset  . Diabetes type II Mother   . Pancreatic cancer Father    Social History   Social History  . Marital Status: Married    Spouse Name: N/A  . Number of Children: N/A  . Years of Education: 12   Occupational History  . disabled    Social History Main Topics  . Smoking status: Current Every Day Smoker -- 0.50 packs/day for 34 years    Types: Cigarettes, Cigars  . Smokeless tobacco: Never Used     Comment: 1/2 PPD  . Alcohol Use: 0.0 oz/week    0 Standard drinks or equivalent per week     Comment: occ  . Drug Use: Yes    Special: Marijuana     Comment: NO LONGER USES-GRADUATING FROM GEO CRE AFTER  3-4 MONTHS                                                                                                                                       . Sexual Activity: Yes    Birth Control/ Protection: None   Other Topics Concern  . Not on file   Social History Narrative  Family: Mom with DM, Dad died of pancreatic CA  Social: Lives with wife who cares for him, 19 pack year smoking history, occasional wine use, cocaine and marijuana in  last few days, not interested in quitting, on disability, used to work in car wash.   Review of Systems: Pertinent items are noted in HPI.  Physical Exam: Blood pressure 186/97, pulse 106, temperature 97.8 F (36.6 C), temperature source Oral, resp. rate 17, height 5' 8"  (1.727 m), weight 83.915 kg (185 lb), SpO2 97 %.   Gen: uncomfortable-appearing gentleman seated on ED exam bed HEENT: EOMI, mild yellow sclera, no conjunctival pallor, dry mucus membranes, no OP lesions, poor dentition CV: tachycardic, regular rhythm,  no m/r/g Pulm: Normal effort and rate, CTAB  Abdomen: soft, NT, ND, +BS, central vertical scar midline Extremities: pulse 2+ b/l UE, b/l BKA amputation with no lesions or ulcers appreciated  Skin: horizontal well-healed scars on R forearm  Lab results: @LABTEST @  Imaging results:  No results found.  Other results: EKG: unchanged from previous tracings, sinus tachycardia, left axis deviation, J point elevation, T wave inversion aVL, peaked T waves in lateral leads, qR in aVL with QRS<120: LAFB.  Assessment & Plan by Problem: Active Problems:   Type 2 diabetes mellitus with hyperosmolar nonketotic hyperglycemia ([place])  Mr. Villers is a 50 yo gentleman with DMII c/b neuropathy and b/l BKA, CKD stage 2 (baseline Cr 1.3-1.4), GERD with ulcerative esophagitis and history of MW tear, chronic EtOH pancreatitis, depression, HLD, HTN, chronic pyelonephritis with history renal/perinephric abscess 2010 who presents with hematemesis.  Type 2DM with hyperosmolar hyperglycemia state, neuropathy, gastroparesis, s/p b/l BKA: Most likely etiology of his nausea, vomiting and abdominal pain given BG >800, lactate 2.0 in the context of intermittent medication use while incarcerated and unknown BGs in the last few weeks with no ketones or signs of acidosis. It is likely patient has become dehydrated and developed acute pre-renal failure as below which precipitated this. Patient could also have  URI or viral GI illness that precipitated this given abdominal pain, nausea, vomiting and dyspnea and cough. Other potential etiologies include chronic pancreatitis given 1 week of burning epigastric pain and known history, though lipase normal. Given HLD, PVD, cocaine and cigarette use with lactate 2, mesenteric ischemia is also possible. Intoxication is also possible but less likely with cocaine and marijuana with no recent EtOH use. The goal will be volume repletion (s/p 2L in ED) and control of blood sugar and patient will need outpatient supplies and clarity of DM regimen before discharge. -- continue insulin gtt 1U/ml until BG 250-300, then SQ insulin -- continue IVF: NS 12m/hr until BG 250-300, then D5 1/2NS 1238mhr -- f/u CMP -- f/u lactate  -- hold home metformin  -- continue home gabapentin 600 tid -- continue home amitriptyline  -- APAP prn for abdominal pain -- Zofran prn for nausea -- start miralax and senna daily   Hematemesis: Likely due to underlying esophagitis and injury given MW tear history and ulcerative esophagitis with GERD. Patient is anemic, though stable with no signs ongoing bleed necessitating EGD at this time. -- f/u CBC  -- start Protonix 4055m-- Zofran prn for nausea as above   Elevated alk phos: Given hyperCa below, Nl Tbili, AST, ALT and known CKD could be from bone turnover.  -- f/u GGT  Hypercalcemia: could be due to hyperPTH or increased bone turnover such as paget's disease.  -- repeat serum Ca, if elevated consider PTH  Chronic normocytic anemia: Baseline Hgb 9-10. Likely due to acute GI blood loss and chronic inflammation from pancreatitis, esophagitis and other comorbidities, though Fe deficiency (past low-NL ferritin) and CKD could also be contributing. -- f/u CBC as above   Leukocytosis/thrombocytosis: Tachycardic, but HD stable and AF. Most likely reactive in the setting of acute bleeding with pancreatitis and CKD, but could also be due to URI  or GI infection.  -- f/u CBC as above   Hyperkalemia: Most likely due to insulin deficiency and exacerbated by CKD. Patient is mildly alkalotic.  -- f/u CMP as above   Hyponatremia: Hypovolemic on exam and per history (dry MM, weight loss, recent inc thirst). Most likely renal losses  and decreased PO intake given likely osmotic diuresis with CKD. Could be experiencing GI loss though no diarrhea, third spacing from chronic pancreatitis could be contributing.  -- IVF as above -- f/u CMP as above   Acute on Chronic CKD2: Baseline SCr 1.3-1.4, Likely pre-renal given hypovolemic on exam.  -- IVF as above -- f/u CMP as above    HTN: normotensive -- continue home amlodipine 30m daily -- continue home lisinopril 24mdaily  HLD: Stable -- continue atorvastatin 4040m Substance abuse disorder: Cocaine, marijuana + on UDS. 19 pack year tobacco use. Not interested in cessation at this time.   FEN/GI/ppx: Diet: NPO DVT: Lovenox   Code: Full, per patient on admission   This is a [employer] information officerte.  The care of the patient was discussed with Dr. JenJacques Earthlyd the assessment and plan was formulated with their assistance.  Please see their note for official documentation of the patient encounter.   Signed: NicEvert Kohled Student 10/20/2015, 4:36 PM

## 2015-10-20 NOTE — ED Notes (Signed)
Per EMS- pt here for c.o. Constipation for one week. Pt wife reports dark stool to bed sheet. Pt also has vomiting since last night. Coffee ground emesis starting last night. Pt actively vomiting. CBG elevated, reading "HIGH." PIV 20G to LAC placed by EMS. 198/98 HR 108.

## 2015-10-20 NOTE — ED Notes (Signed)
IV start attempt x 2 RN unsuccessful.  Notified Joy PA of delay.  IV team consulted.

## 2015-10-20 NOTE — ED Provider Notes (Signed)
CSN: [id number]     Arrival date & time 10/20/15  1213 History   First MD Initiated Contact with Patient 10/20/15 1234     Chief Complaint  Patient presents with  . Hematemesis  . Rectal Bleeding  . Hyperglycemia     (Consider location/radiation/quality/duration/timing/severity/associated sxs/prior Treatment) HPI   [name] is a 50 y.o. male, with a history of Polysubstance abuse, alcoholic pancreatitis, hypertension, DM, presenting to the ED with Coffee-ground emesis and constipation. Emesis began this morning. Last bowel movement week ago. Patient only endorses abdominal discomfort with vomiting. Patient states that this has happened before but cannot say what the cause was. Patient's last insulin was 22 units of Lantus "sometime yesterday." Patient still drinks alcohol, but states he hasn't used alcohol in weeks. Patient denies fever/chills, shortness of breath, chest pain, or any other complaints.      Past Medical History  Diagnosis Date  . Onychomycosis   . Hypertension   . Diabetic peripheral neuropathy ([place])   . Diabetic foot ulcer ([place])     s/p Right first ray amputation, left transmetatarsal amputation with revision  . History of drug abuse     Cocaine and marijuana  . Exposure to trichomonas     treated empirically  . Cellulitis of left foot 03/2008    left 4th and 5th metatarsal area  . Hyperlipidemia   . Alcoholic pancreatitis 76/5465  . Ulcerative esophagitis 0354    severe, complicated with UGI bleed  . Mallory [name] tear April 2009  . History of prolonged Q-T interval on ECG   . 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  . 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 CKD (chronic kidney disease) stage 2, GFR 60-89 ml/min     BL SCr 1.3-1.4  . Depression   . Peripheral vascular disease ([place])   . Type 2 diabetes  mellitus, uncontrolled, with renal complications ([place])   . GERD (gastroesophageal reflux disease)   . Hx of right BKA ([place]) 09/13/2012     Due to severe wound infection with sepsis  . 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 S/P bilateral BKA (below knee amputation) ([place]) 12/12/13    L BKA  . Cellulitis and abscess of leg 01/31/2014  . Osteomyelitis of left leg ([place]) 01/21/2014  . History of left below knee amputation ([place]) 01/21/2014    Performed on    . Status post below knee amputation of right lower extremity ([place]) 03/23/2014   Past Surgical History  Procedure Laterality Date  . Toe amputation  06/2011    left; 4th toe  . Nephrectomy      L side  . I&d extremity  08/24/2011    Procedure: IRRIGATION AND DEBRIDEMENT EXTREMITY;  Surgeon: Meredith Pel, MD;  Location: [facility];  Service: Orthopedics;  Laterality: Left;  . Amputation  08/24/2011    Procedure: AMPUTATION RAY;  Surgeon: Meredith Pel, MD;  Location: [place];  Service: Orthopedics;  Laterality: Left;  left fourth toe Ray Resection  . Amputation  12/05/2011    Procedure: AMPUTATION RAY;  Surgeon: Meredith Pel, MD;  Location: [place];  Service: Orthopedics;  Laterality: Left;  . I&d extremity  02/18/2012    Procedure: IRRIGATION AND DEBRIDEMENT EXTREMITY;  Surgeon: Harrell Gave  Kerry Fort, MD;  Location: [place];  Service: Orthopedics;  Laterality: Left;  . Amputation  02/18/2012    Procedure: AMPUTATION DIGIT;  Surgeon: Mcarthur Rossetti, MD;  Location: [place];  Service: Orthopedics;  Laterality: Left;  revison transmetatarsal  . I&d extremity Left 08/28/2012    Procedure: IRRIGATION AND DEBRIDEMENT EXTREMITY;  Surgeon: Linna Hoff, MD;  Location: [place];  Service: Orthopedics;  Laterality: Left;  CYSTO TUBING/IRRIGATION, LEAD HAND.  . I&d extremity Left 08/30/2012    Procedure: IRRIGATION AND DEBRIDEMENT EXTREMITY;  Surgeon:  Linna Hoff, MD;  Location: [place];  Service: Orthopedics;  Laterality: Left;  . Amputation Right 09/01/2012    Procedure: FOOT 1ST RAY AMPUTATION;  Surgeon: Newt Minion, MD;  Location: [place];  Service: Orthopedics;  Laterality: Right;  . Amputation Left 09/01/2012    Procedure: FOOT REVISION TRANSMETATARSAL AMPUTATION ;  Surgeon: Newt Minion, MD;  Location: [place];  Service: Orthopedics;  Laterality: Left;  . Amputation Right 09/13/2012    Procedure: AMPUTATION BELOW KNEE;  Surgeon: Newt Minion, MD;  Location: [place];  Service: Orthopedics;  Laterality: Right;  Right Below Knee Amputation  . Amputation Left 12/12/2013    Procedure: Left Below Knee Amputation;  Surgeon: Newt Minion, MD;  Location: [facility];  Service: Orthopedics;  Laterality: Left;  . Stump revision Left 01/23/2014    Procedure: STUMP REVISION;  Surgeon: Newt Minion, MD;  Location: [place];  Service: Orthopedics;  Laterality: Left;  . Esophagogastroduodenoscopy (egd) with propofol N/A 09/16/2014    Procedure: ESOPHAGOGASTRODUODENOSCOPY (EGD) WITH PROPOFOL;  Surgeon: Wonda Horner, MD;  Location: [facility] ENDOSCOPY;  Service: Endoscopy;  Laterality: N/A;   Family History  Problem Relation Age of Onset  . Diabetes type II Mother   . Pancreatic cancer Father    Social History  Substance Use Topics  . Smoking status: Current Every Day Smoker -- 0.50 packs/day for 34 years    Types: Cigarettes, Cigars  . Smokeless tobacco: Never Used     Comment: 1/2 PPD  . Alcohol Use: 0.0 oz/week    0 Standard drinks or equivalent per week     Comment: occ    Review of Systems  Constitutional: Negative for fever, chills and diaphoresis.  Respiratory: Negative for shortness of breath.   Cardiovascular: Negative for chest pain.  Gastrointestinal: Positive for nausea, vomiting (hematemesis) and constipation. Negative for abdominal pain, diarrhea and blood in stool.  Genitourinary: Negative for flank pain and difficulty urinating.   Musculoskeletal: Negative for back pain and neck pain.  Skin: Negative for color change and pallor.  Neurological: Negative for dizziness, syncope, weakness, light-headedness and headaches.  All other systems reviewed and are negative.     Allergies  Review of patient's allergies indicates no known allergies.  Home Medications   Prior to Admission medications   Medication Sig Start Date End Date Taking? Authorizing Provider  ACCU-CHEK FASTCLIX LANCETS MISC Check blood sugar before meals and bedtime 10/05/15   [name], MD  amitriptyline (ELAVIL) 50 MG tablet Take 1 tablet (50 mg total) by mouth at bedtime. 08/11/15   [name], MD  amLODipine (NORVASC) 10 MG tablet Take 1 tablet (10 mg total) by mouth daily. 08/26/15   Liberty Handy, MD  aspirin 81 MG chewable tablet Chew 1 tablet (81 mg total) by mouth daily. Patient not taking: Reported on 10/18/2015 06/02/15   Norval Gable, MD  atorvastatin (LIPITOR) 40 MG tablet Take 1 tablet (40  mg total) by mouth daily at 6 PM. 06/02/15   Norval Gable, MD  Blood Glucose Monitoring Suppl (ACCU-CHEK NANO SMARTVIEW) w/Device KIT Check blood sugar before meals and bedtime 10/08/15   [name], MD  diclofenac sodium (VOLTAREN) 1 % GEL Apply 4 g topically 4 (four) times daily. 08/26/15   Liberty Handy, MD  EASY COMFORT INSULIN SYRINGE 31G X 5/16" 1 ML MISC Use as directed daily for insulin injection. 10/05/14   Historical Provider, MD  EASY TOUCH PEN NEEDLES 29G X 12MM MISC Use to inject victoza daily. 08/11/15   [name], MD  feeding supplement, GLUCERNA SHAKE, (GLUCERNA SHAKE) LIQD Take 237 mLs by mouth 2 (two) times daily between meals. 09/16/14   Dellia Nims, MD  ferrous sulfate 325 (65 FE) MG tablet Take 1 tablet (325 mg total) by mouth daily with breakfast. 10/28/14   Ejiroghene Arlyce Dice, MD  folic acid (FOLVITE) 1 MG tablet Take 1 tablet (1 mg total) by mouth daily. 09/16/14   Tasrif Ahmed, MD  gabapentin (NEURONTIN) 600 MG tablet Take 1  tablet (600 mg total) by mouth 3 (three) times daily. 02/01/15   [name], MD  glucose blood (ACCU-CHEK SMARTVIEW) test strip Check blood sugar before meals and bedtime 10/05/15   [name], MD  insulin aspart (NOVOLOG) 100 UNIT/ML injection Inject 0-15 Units into the skin 3 (three) times daily with meals. 06/02/15   Norval Gable, MD  insulin glargine (LANTUS) 100 UNIT/ML injection Inject 0.22 mLs (22 Units total) into the skin 2 (two) times daily. 06/03/15 06/02/16  Riccardo Dubin, MD  lisinopril (PRINIVIL,ZESTRIL) 20 MG tablet Take 1 tablet (20 mg total) by mouth daily. 10/28/14   Ejiroghene Arlyce Dice, MD  metFORMIN (GLUCOPHAGE) 1000 MG tablet Take 1 tablet (1,000 mg total) by mouth 2 (two) times daily with a meal. 08/26/15 08/25/16  Liberty Handy, MD  Omeprazole-Sodium Bicarbonate (ZEGERID) 20-1100 MG CAPS capsule Take 1 capsule by mouth daily before breakfast. 10/18/15   [name], MD  polyethylene glycol (MIRALAX / GLYCOLAX) packet MIX 1 PACKET INTO 8 OUNCES OF LIQUID DAILY AS NEEDED 06/06/15   [name], MD  senna-docusate (SENOKOT-S) 8.6-50 MG tablet Take 1 tablet by mouth daily. 07/09/15   Tasrif Ahmed, MD  sildenafil (REVATIO) 20 MG tablet Take 2-5 tablets before sexual activity. 08/25/14   [name], MD  sucralfate (CARAFATE) 1 GM/10ML suspension Take 10 mLs (1 g total) by mouth 4 (four) times daily -  with meals and at bedtime. 10/27/14   [name], MD   BP 199/92 mmHg  Pulse 107  Temp(Src) 97.8 F (36.6 C) (Oral)  Resp 16  Ht 5' 8"  (1.727 m)  Wt 83.915 kg  BMI 28.14 kg/m2  SpO2 100% Physical Exam  Constitutional: He appears well-developed and well-nourished. No distress.  Patient vomited twice during the exam.  HENT:  Head: Normocephalic and atraumatic.  Mouth/Throat: Oropharynx is clear and moist.  Eyes: Conjunctivae are normal.  Neck: Normal range of motion. Neck supple.  Cardiovascular: Regular rhythm, normal heart sounds and intact distal pulses.   Tachycardia present.   Pulmonary/Chest: Effort normal and breath sounds normal. No respiratory distress.  Abdominal: Soft. He exhibits no distension. There is no tenderness. There is no guarding.  Musculoskeletal: He exhibits no edema or tenderness.  Lymphadenopathy:    He has no cervical adenopathy.  Neurological: He is alert.  Skin: Skin is warm and dry. He is not diaphoretic.  Psychiatric: He has a normal mood and  affect. His behavior is normal.  Nursing note and vitals reviewed.   ED Course  Procedures (including critical care time) Labs Review Labs Reviewed  COMPREHENSIVE METABOLIC PANEL - Abnormal; Notable for the following:    Sodium 129 (*)    Potassium 5.4 (*)    Chloride 91 (*)    Glucose, Bld 879 (*)    BUN 30 (*)    Creatinine, Ser 1.69 (*)    Calcium 10.5 (*)    AST 14 (*)    Alkaline Phosphatase 235 (*)    GFR calc non Af Amer 46 (*)    GFR calc Af Amer 53 (*)    All other components within normal limits  CBC - Abnormal; Notable for the following:    WBC 19.2 (*)    Hemoglobin 12.0 (*)    HCT 37.8 (*)    Platelets 553 (*)    All other components within normal limits  URINALYSIS, ROUTINE W REFLEX MICROSCOPIC (NOT AT [facility]) - Abnormal; Notable for the following:    Glucose, UA >1000 (*)    All other components within normal limits  URINE RAPID DRUG SCREEN, HOSP PERFORMED - Abnormal; Notable for the following:    Cocaine POSITIVE (*)    Tetrahydrocannabinol POSITIVE (*)    All other components within normal limits  URINE MICROSCOPIC-ADD ON - Abnormal; Notable for the following:    Squamous Epithelial / LPF 0-5 (*)    Bacteria, UA RARE (*)    All other components within normal limits  CBG MONITORING, ED - Abnormal; Notable for the following:    Glucose-Capillary >600 (*)    All other components within normal limits  I-STAT CG4 LACTIC ACID, ED - Abnormal; Notable for the following:    Lactic Acid, Venous 2.00 (*)    All other components within normal limits   I-STAT VENOUS BLOOD GAS, ED - Abnormal; Notable for the following:    pH, Ven 7.466 (*)    pCO2, Ven 42.8 (*)    Bicarbonate 30.9 (*)    Acid-Base Excess 6.0 (*)    All other components within normal limits  CBG MONITORING, ED - Abnormal; Notable for the following:    Glucose-Capillary >600 (*)    All other components within normal limits  LIPASE, BLOOD  ETHANOL  BLOOD GAS, VENOUS  MAGNESIUM  GAMMA GT  LACTIC ACID, PLASMA  BASIC METABOLIC PANEL  POC OCCULT BLOOD, ED  TYPE AND SCREEN   BUN  Date Value Ref Range Status  10/20/2015 30* 6 - 20 mg/dL Final  06/19/2015 18 6 - 20 mg/dL Final  06/02/2015 19 6 - 20 mg/dL Final  06/01/2015 28* 6 - 20 mg/dL Final   CREAT  Date Value Ref Range Status  10/09/2014 1.36* 0.50 - 1.35 mg/dL Final  09/23/2014 1.61* 0.50 - 1.35 mg/dL Final  06/16/2014 1.45* 0.50 - 1.35 mg/dL Final  05/26/2014 1.43* 0.50 - 1.35 mg/dL Final   CREATININE, SER  Date Value Ref Range Status  10/20/2015 1.69* 0.61 - 1.24 mg/dL Final  06/19/2015 1.46* 0.61 - 1.24 mg/dL Final  06/02/2015 1.41* 0.61 - 1.24 mg/dL Final  06/01/2015 1.57* 0.61 - 1.24 mg/dL Final   ALT  Date Value Ref Range Status  10/20/2015 24 17 - 63 U/L Final  03/13/2015 16* 17 - 63 U/L Final  03/12/2015 17 17 - 63 U/L Final  10/26/2014 16* 17 - 63 U/L Final    AST  Date Value Ref Range Status  10/20/2015 14* 15 -  41 U/L Final  03/13/2015 24 15 - 41 U/L Final  03/12/2015 23 15 - 41 U/L Final  10/26/2014 25 15 - 41 U/L Final     HEMOGLOBIN  Date Value Ref Range Status  10/20/2015 12.0* 13.0 - 17.0 g/dL Final  06/19/2015 10.0* 13.0 - 17.0 g/dL Final  06/01/2015 9.6* 13.0 - 17.0 g/dL Final  03/14/2015 12.5* 13.0 - 17.0 g/dL Final    I have personally reviewed and evaluated these lab results as part of my medical decision-making.   EKG Interpretation None       Medications  sodium chloride 0.9 % bolus 1,000 mL (1,000 mLs Intravenous Restarted 10/20/15 1500)    And  sodium  chloride 0.9 % bolus 1,000 mL (1,000 mLs Intravenous Restarted 10/20/15 1500)    And  0.9 %  sodium chloride infusion (not administered)  dextrose 5 %-0.45 % sodium chloride infusion (not administered)  insulin regular (NOVOLIN R,HUMULIN R) 250 Units in sodium chloride 0.9 % 250 mL (1 Units/mL) infusion (5.4 Units/hr Intravenous New Bag/Given 10/20/15 1551)  amLODipine (NORVASC) tablet 10 mg (10 mg Oral Given 10/20/15 1550)  lisinopril (PRINIVIL,ZESTRIL) tablet 20 mg (20 mg Oral Given 10/20/15 1550)  ondansetron (ZOFRAN) injection 4 mg (4 mg Intravenous Given 10/20/15 1256)  ondansetron (ZOFRAN-ODT) disintegrating tablet 4 mg (4 mg Oral Given 10/20/15 1550)    MDM   Final diagnoses:  Hyperglycemia  Coffee ground emesis    Kahli B Poitras presents with hematemesis and hyperglycemia since this morning.  Findings and plan of care discussed with [name], MD.   Difficulty was had in obtaining IV access with this patient due to poor PIV options. EMS IV infiltrated during use here in the ED. Patient is hyperglycemic without acidosis at present. Peaked T waves on EKG showing sinus tachycardia otherwise. 3:19 PM Spoke with Jacques Earthly, MD, resident with the internal medicine teaching service, who states she will come see the patient for admission. Is working with Dr. Lynnae January as the attending.  Filed Vitals:   10/20/15 1500 10/20/15 1515 10/20/15 1530 10/20/15 1545  BP: 173/92 172/86 176/88 173/92  Pulse: 107 107 105 110  Temp:      TempSrc:      Resp: 23 20 19 20   Height:      Weight:      SpO2: 93% 96% 97% 93%   Filed Vitals:   10/20/15 1500 10/20/15 1515 10/20/15 1530 10/20/15 1545  BP: 173/92 172/86 176/88 173/92  Pulse: 107 107 105 110  Temp:      TempSrc:      Resp: 23 20 19 20   Height:      Weight:      SpO2: 93% 96% 97% 93%      Lorayne Bender, PA-C 10/20/15 1611  [name], MD 10/22/15 2206

## 2015-10-20 NOTE — ED Notes (Signed)
Medicine at bedside.

## 2015-10-21 ENCOUNTER — Inpatient Hospital Stay (HOSPITAL_COMMUNITY): Payer: Medicaid Other

## 2015-10-21 DIAGNOSIS — K3184 Gastroparesis: Secondary | ICD-10-CM

## 2015-10-21 DIAGNOSIS — E1143 Type 2 diabetes mellitus with diabetic autonomic (poly)neuropathy: Secondary | ICD-10-CM | POA: Insufficient documentation

## 2015-10-21 LAB — BASIC METABOLIC PANEL
Anion gap: 8 (ref 5–15)
BUN: 19 mg/dL (ref 6–20)
CHLORIDE: 105 mmol/L (ref 101–111)
CO2: 26 mmol/L (ref 22–32)
Calcium: 9.3 mg/dL (ref 8.9–10.3)
Creatinine, Ser: 1.33 mg/dL — ABNORMAL HIGH (ref 0.61–1.24)
GFR calc Af Amer: >60 mL/min (ref >60)
GFR calc non Af Amer: >60 mL/min (ref >60)
Glucose, Bld: 190 mg/dL — ABNORMAL HIGH (ref 65–99)
Potassium: 4.8 mmol/L (ref 3.5–5.1)
Sodium: 139 mmol/L (ref 135–145)

## 2015-10-21 LAB — CBC
HCT: 33.4 % — ABNORMAL LOW (ref 39.0–52.0)
HEMOGLOBIN: 10.7 g/dL — AB (ref 13.0–17.0)
MCH: 27.5 pg (ref 26.0–34.0)
MCHC: 32 g/dL (ref 30.0–36.0)
MCV: 85.9 fL (ref 78.0–100.0)
PLATELETS: 529 10*3/uL — AB (ref 150–400)
RBC: 3.89 MIL/uL — AB (ref 4.22–5.81)
RDW: 15.1 % (ref 11.5–15.5)
WBC: 25 10*3/uL — ABNORMAL HIGH (ref 4.0–10.5)

## 2015-10-21 LAB — DIFFERENTIAL
BASOS PCT: 0 %
Basophils Absolute: 0 10*3/uL (ref 0.0–0.1)
EOS ABS: 0.1 10*3/uL (ref 0.0–0.7)
Eosinophils Relative: 1 %
LYMPHS ABS: 3.2 10*3/uL (ref 0.7–4.0)
LYMPHS PCT: 14 %
MONO ABS: 1.2 10*3/uL — AB (ref 0.1–1.0)
Monocytes Relative: 5 %
NEUTROS PCT: 80 %
Neutro Abs: 18.6 10*3/uL — ABNORMAL HIGH (ref 1.7–7.7)

## 2015-10-21 LAB — COMPREHENSIVE METABOLIC PANEL
ALK PHOS: 146 U/L — AB (ref 38–126)
ALT: 19 U/L (ref 17–63)
ANION GAP: 7 (ref 5–15)
AST: 16 U/L (ref 15–41)
Albumin: 3 g/dL — ABNORMAL LOW (ref 3.5–5.0)
BUN: 22 mg/dL — ABNORMAL HIGH (ref 6–20)
CO2: 26 mmol/L (ref 22–32)
CREATININE: 1.42 mg/dL — AB (ref 0.61–1.24)
Calcium: 9.8 mg/dL (ref 8.9–10.3)
Chloride: 108 mmol/L (ref 101–111)
GFR calc Af Amer: >60 mL/min (ref >60)
GFR calc non Af Amer: 56 mL/min — ABNORMAL LOW (ref >60)
GLUCOSE: 131 mg/dL — AB (ref 65–99)
Potassium: 4.4 mmol/L (ref 3.5–5.1)
SODIUM: 141 mmol/L (ref 135–145)
Total Bilirubin: 0.3 mg/dL (ref 0.3–1.2)
Total Protein: 7.1 g/dL (ref 6.5–8.1)

## 2015-10-21 LAB — GLUCOSE, CAPILLARY
GLUCOSE-CAPILLARY: 142 mg/dL — AB (ref 65–99)
GLUCOSE-CAPILLARY: 386 mg/dL — AB (ref 65–99)
GLUCOSE-CAPILLARY: 426 mg/dL — AB (ref 65–99)
GLUCOSE-CAPILLARY: 367 mg/dL — AB (ref 65–99)
Glucose-Capillary: 130 mg/dL — ABNORMAL HIGH (ref 65–99)
Glucose-Capillary: 171 mg/dL — ABNORMAL HIGH (ref 65–99)
Glucose-Capillary: 180 mg/dL — ABNORMAL HIGH (ref 65–99)
Glucose-Capillary: 241 mg/dL — ABNORMAL HIGH (ref 65–99)

## 2015-10-21 MED ORDER — METOCLOPRAMIDE HCL 5 MG/ML IJ SOLN
10.0000 mg | Freq: Four times a day (QID) | INTRAMUSCULAR | Status: DC
  Administered 2015-10-21 – 2015-10-22 (×4): 10 mg via INTRAVENOUS
  Filled 2015-10-21 (×3): qty 2

## 2015-10-21 MED ORDER — INSULIN GLARGINE 100 UNIT/ML ~~LOC~~ SOLN: 28.0000 [IU] | Freq: Every day | SUBCUTANEOUS | Status: DC

## 2015-10-21 MED ORDER — SUCRALFATE 1 GM/10ML PO SUSP
1.0000 g | Freq: Three times a day (TID) | ORAL | Status: DC
  Administered 2015-10-21 – 2015-10-22 (×5): 1 g via ORAL
  Filled 2015-10-21 (×5): qty 10

## 2015-10-21 MED ORDER — INSULIN GLARGINE 100 UNIT/ML ~~LOC~~ SOLN: 30.0000 [IU] | Freq: Every day | SUBCUTANEOUS | Status: DC

## 2015-10-21 MED ORDER — INSULIN GLARGINE 100 UNIT/ML ~~LOC~~ SOLN
26.0000 [IU] | Freq: Every day | SUBCUTANEOUS | Status: DC
  Filled 2015-10-21: qty 0.26

## 2015-10-21 MED ORDER — INSULIN ASPART 100 UNIT/ML ~~LOC~~ SOLN
0.0000 [IU] | Freq: Every day | SUBCUTANEOUS | Status: DC
  Administered 2015-10-21: 5 [IU] via SUBCUTANEOUS

## 2015-10-21 MED ORDER — INSULIN GLARGINE 100 UNIT/ML ~~LOC~~ SOLN
26.0000 [IU] | Freq: Every day | SUBCUTANEOUS | Status: DC
  Administered 2015-10-21: 26 [IU] via SUBCUTANEOUS
  Filled 2015-10-21: qty 0.26

## 2015-10-21 MED ORDER — INSULIN ASPART 100 UNIT/ML ~~LOC~~ SOLN
0.0000 [IU] | Freq: Three times a day (TID) | SUBCUTANEOUS | Status: DC
  Administered 2015-10-21: 15 [IU] via SUBCUTANEOUS
  Administered 2015-10-22: 11 [IU] via SUBCUTANEOUS
  Administered 2015-10-22: 5 [IU] via SUBCUTANEOUS

## 2015-10-21 MED ORDER — INSULIN GLARGINE 100 UNIT/ML ~~LOC~~ SOLN
30.0000 [IU] | Freq: Every day | SUBCUTANEOUS | Status: DC
Start: 2015-10-22 — End: 2015-10-22
  Administered 2015-10-22: 30 [IU] via SUBCUTANEOUS
  Filled 2015-10-21: qty 0.3

## 2015-10-21 MED ORDER — POTASSIUM CHLORIDE 10 MEQ/100ML IV SOLN
10.0000 meq | INTRAVENOUS | Status: AC
  Administered 2015-10-21 (×2): 10 meq via INTRAVENOUS
  Filled 2015-10-21: qty 100

## 2015-10-21 MED ORDER — INSULIN ASPART 100 UNIT/ML ~~LOC~~ SOLN
20.0000 [IU] | Freq: Once | SUBCUTANEOUS | Status: AC
  Administered 2015-10-21: 20 [IU] via SUBCUTANEOUS

## 2015-10-21 MED ORDER — INSULIN GLARGINE 100 UNIT/ML ~~LOC~~ SOLN
28.0000 [IU] | Freq: Every day | SUBCUTANEOUS | Status: DC
  Administered 2015-10-21: 28 [IU] via SUBCUTANEOUS
  Filled 2015-10-21: qty 0.28

## 2015-10-21 MED ORDER — GLUCERNA SHAKE PO LIQD
237.0000 mL | Freq: Three times a day (TID) | ORAL | Status: DC
  Administered 2015-10-21 – 2015-10-22 (×4): 237 mL via ORAL

## 2015-10-21 NOTE — Progress Notes (Signed)
Subjective: He had fruit juice this morning that upset his stomach. His nausea and vomiting had improved prior to that. Emesis still has coffee grounds but decreasing. No BM. Still having epigastric burning sensation.  Objective: Vital signs in last 24 hours: Filed Vitals:   10/21/15 0357 10/21/15 0738 10/21/15 0953 10/21/15 1144  BP: 158/84 168/90 193/105 154/72  Pulse: 112 113  114  Temp: 97.5 F (36.4 C) 98.2 F (36.8 C)  99.6 F (37.6 C)  TempSrc: Oral Oral  Oral  Resp: _0 Height:      Weight: 173 lb 11.6 oz (78.8 kg)     SpO2: 99% 99%  100%   Physical Exam General Apperance: NAD HEENT: Normocephalic, atraumatic, anicteric sclera Neck: Supple, trachea midline Lungs: Clear to auscultation bilaterally. No wheezes, rhonchi or rales. Breathing comfortably Heart: Regular rate and rhythm, no murmur/rub/gallop Abdomen: Soft, nontender, nondistended, no rebound/guarding Extremities: Warm and well perfused, no edema Skin: No rashes or lesions Neurologic: Alert and interactive. No gross deficits.  Assessment/Plan: 50 year old man with history of HTN, DM2 with peripheral neuropathy, history of diabetic foot ulcer and PVD s/p bilateral BKA, polysubstance abuse, HLD, ulcerative esophagitis, CKD2, depression presenting with vomiting found to have hyperglycemia.  HHS, AKI on CKD2: History of uncontrolled diabetes with hospitalizations before for DKA. Remains tachycardic today. CBG down to 180. Off insulin gtt.  -NS_1 . Change to D5 1/2 NS @ 125 when CBG less than 250 -Carb mod diet -Hold home metformin -SSI, Lantus 26u daily started. Increase to 28u daily. -Continue home amitripyline and gabapentin for peripheral neuropathy  Erosive esophagitis: History of gastroparesis. Hemodynamically stable. Hgb at baseline. -Pantoprazole 31m BID -Restart home carafate -Reglan 131mQID  Mild hypercalcemia: Ca corrected is 10.2. Down from 10.5. Resolving.  Elevated alkaline  phosphatase: Elevated GGT. Alk phos is improving. RUQ [place] nonspecific thickening of the gallbladder wall and small gallbladder polyp. No cholelithiasis.   Thrombocytosis: Plt 553. Possibly reactive. Improving  Constipation: Blood in toilet likely from hemorrhoids versus fissures. -Senna-docusate 1 tablet daily -Miralax daily  Chronic anemia: Hgb 12 on admission. Baseline around 9-10. FOBT negative.  HTN: Continue home amlodipine 1040maily and lisinopril 25m53mily.  HLD: Continue home Lipitor.  Polysubstance abuse: UDS positive for cocaine and THC. Not interested in cessation  VTE ppx: Lovenox Code: FULL  Dispo: Anticipated discharge in approximately 1 day(s).   LOS: 1 day   JennMilagros Loll 10/21/2015, 11:45 AM Pager: [phone]

## 2015-10-21 NOTE — Progress Notes (Signed)
Subjective: Patient was improving from a nausea and vomiting standpoint overnight. He had some blood in his emesis this AM but overall notes the coffee ground emesis is "lightening up". He drank orange juice this AM which has caused epigastric burning that has been constant throughout the morning. Patient notes this has happened in the past. He is unsure if this feels like his past issues of esophagitis or pancreatitis. He has not had a BM since yesterday AM. Patient insists he "is not a heavy drinker" drinking alcohol "less than once a week".   Objective: Vital signs in last 24 hours: Filed Vitals:   10/21/15 0357 10/21/15 0738 10/21/15 0953 10/21/15 1144  BP: 158/84 168/90 193/105 154/72  Pulse: 112 113  114  Temp: 97.5 F (36.4 C) 98.2 F (36.8 C)  99.6 F (37.6 C)  TempSrc: Oral Oral  Oral  Resp: 16 18  18   Height:      Weight: 78.8 kg (173 lb 11.6 oz)     SpO2: 99% 99%  100%   Weight change:   Intake/Output Summary (Last 24 hours) at 10/21/15 1203 Last data filed at 10/21/15 1042  Gross per 24 hour  Intake 2571.15 ml  Output   1200 ml  Net 1371.15 ml   Gen: uncomfortable appearing gentleman in distress seated on foot of hospital bed HEENT: EOMI, anicteric sclera CV: RRR no m/r/g Pulm: normal effort CTAB Abdomen: soft, nontender, nondistended, no organomegaly Extremities: pulse 2+ b/l UE, b/l BKA amputation with no lesions or ulcers appreciated  Skin: horizontal well-healed scars on R forearm  Lab Results: @LABTEST2 @ Micro Results: No results found for this or any previous visit (from the past 240 hour(s)). Studies/Results: US Abdomen Limited Ruq  10/21/2015  CLINICAL DATA:  Right upper quadrant abdominal pain. Symptoms for 2 days. Previous left nephrectomy. EXAM: US ABDOMEN LIMITED - RIGHT UPPER QUADRANT COMPARISON:  04/27/2012 FINDINGS: Gallbladder: Gallbladder wall is thickened and mildly irregular, measuring 4.3 mm. A small polyp measures 3 mm. No definite stones  or pericholecystic fluid. No sonographic Murphy's sign. Common bile duct: Diameter: 5.3 mm Liver: No focal lesion identified. Within normal limits in parenchymal echogenicity. IMPRESSION: 1. Nonspecific thickening of the gallbladder wall. 2. Small gallbladder polyp. 3. No cholelithiasis. Electronically Signed   By: Nolon Nations M.D.   On: 10/21/2015 09:00   Medications: I have reviewed the patient's current medications.  Scheduled Meds: . amitriptyline  50 mg Oral QHS  . amLODipine  10 mg Oral Daily  . atorvastatin  40 mg Oral q1800  . enoxaparin (LOVENOX) injection  40 mg Subcutaneous Q24H  . gabapentin  600 mg Oral TID  . insulin aspart  0-15 Units Subcutaneous TID WC  . insulin aspart  0-5 Units Subcutaneous QHS  . insulin glargine  26 Units Subcutaneous Daily  . insulin glargine  26 Units Subcutaneous QHS  . lisinopril  20 mg Oral Daily  . metoCLOPramide (REGLAN) injection  10 mg Intravenous Q6H  . pantoprazole  40 mg Oral BID  . polyethylene glycol  17 g Oral Daily  . senna-docusate  1 tablet Oral Daily  . sucralfate  1 g Oral TID WC & HS   Continuous Infusions: . sodium chloride Stopped (10/21/15 0146)  . dextrose 5 % and 0.45% NaCl 125 mL/hr at 10/21/15 0145   PRN Meds:.acetaminophen, ondansetron (ZOFRAN) IV   Assessment/Plan: Active Problems:   Type 2 diabetes mellitus with hyperosmolar nonketotic hyperglycemia ([place])  Mr. Agrusa is a 50 yo gentleman with DMII  c/b neuropathy and b/l BKA, CKD stage 2 (baseline Cr 1.3-1.4), GERD with ulcerative esophagitis and history of MW tear, chronic EtOH pancreatitis, depression, HLD, HTN, chronic pyelonephritis with history renal/perinephric abscess 2010 who presents with hematemesis.  HHS, AKI on CKD2: Likely dehydration with acute prerenal failure causing this. Known history of uncontrolled diabetes with hospitalizations before for DKA. Electrolyte abnormalities have resolved with SCr now at baseline and BUN improving. Remains  tachycardic today. CBG down to 140. Off insulin gtt.  -- continue D5 1/2 NS @ 181m/hr  -- carb mod diet -- Hold home metformin -- SSI, Increase lantus to 28u daily. -- continue home amitripyline and gabapentin for peripheral neuropathy  Erosive esophagitis: Stable, likely the cause of coffee ground emesis and source of his burning pain given relation with acidic juice intake. Known history of gastroparesis. Remains hemodynamically stable. Hgb at baseline. Takes home omeprazole.  -- continue pantoprazole 444mBID -- restart home carafate -- start reglan 1083mID  Mild hypercalcemia: Ca corrected is 10.1. Down from 10.5. Resolving.  Elevated alkaline phosphatase: Elevated GGT. Alk phos is improving. RUQ US Koreath nonspecific thickening of the gallbladder wall and small gallbladder polyp. No cholelithiasis. This is likely due to DM which can cause moderate (<4x ULN) increase in alk phos. Chronic pancreatitis can also cause this and he has had mildly elevated and high-normal levels in the past year.    Thrombocytosis: Plt 553. Likely reactive. Decreased today which could be dilutional.   Constipation: Blood in toilet likely from hemorrhoids versus fissures. -- continue senna-docusate 1 tablet daily -- continue Miralax daily  Chronic normocytic anemia: Hgb 12 on admission. Baseline around 9-10. FOBT negative. Decrease today likely dilutional though does have minimal blood loss as well. Underlying AOCI and CKS likely contributing to chronic anemia.   HTN: Normotensive. -- Continue home amlodipine 66m33mily and lisinopril 20mg41mly.  HLD:  -- Continue home Lipitor.  Polysubstance abuse: UDS positive for cocaine and THC. 19 pack-year tobacco use. Not interested in cessation.   VTE ppx: Lovenox Code: FULL  Dispo: Anticipated discharge in approximately 1 day(s).   LOS: 1 day   This is a [employer] information officer.  The care of the patient was discussed with Dr. JenniJacques Earthlythe  assessment and plan formulated with their assistance.  Please see their attached note for official documentation of the daily encounter.   LOS: 1 day   NicolEvert Kohl Student 10/21/2015, 12:03 PM

## 2015-10-21 NOTE — Progress Notes (Signed)
Date: 10/21/2015  Patient name: [name]  Medical record number: [id number]  Date of birth: Apr 20, 1965   I have seen and evaluated [name] and discussed their care with the Residency Team. Mr Dehart presented to the ED for vomiting and hematemesis. These sxs started on the AM of admission and were accompanied by burning ABD pain. The vomit was coffee ground emesis. He also had one episode of blood in the toilet bowl prior to admission but none since.   Overnight his sxs improved but this AM, he drank juice and the sxs acutely worsened. He is currently being tx with Carafate, reglan, and PPI.   On admit, his CBG was found to be 879 and he was tx with hyperglycemia protocol.   PMHx, Fam Hx, and/or Soc Hx : He is married and lives in a hotel with his wife. There is a fridge for his insulin. He has known diabetic gastroparesis with diagnostic GES in 2008.   Filed Vitals:   10/21/15 0953 10/21/15 1144  BP: 193/105 154/72  Pulse:  114  Temp:  99.6 F (37.6 C)  Resp:  18  T max 99.6 BP 154/72 HR 114 RR 18 O2 sat 100% RA Sitting on bed. Moaning in pain. Emesis basin at side but no active emesis while team in room. HRachy prominent S2 LCTAB ABD + BS, S/NT/ND S/p B BKA Alert, appropriate affect and mood  Cr 1.69 - 1.33 baseline WBC 19.2 - 25 Plts 529 A1C 10.6 Ferritin 105 HgB 12 - 10.7 baseline GGT 138 Alk Phos chronically elevated   I personally viewed his EKG and confirmed by reading with the official read. Sinus tachy. B atrial enlargement  Assessment and Plan: I have seen and evaluated the patient as outlined above. I agree with the formulated Assessment and Plan as detailed in the residents' note, with the following changes:   1. Poorly controlled type 2 DM admitted in HHS - resolved. The pt has chronically uncontrolled DM with many complications. His A1C most recently was 10.6. He is on an appropriate insulin regimen but has many psychosocial issues that make DM  control challenging. He is currently on a D51/2NS gtt while he is unable to take PO. He is on long and short acting insulin currently. Transition off D5 as condition more stable.  2. UGI bleed - he is hemodynamically stable with a HgB at baseline. He has known distal erosive gastritis per EGD in June. He is on a PPI and we are cont that as an inpt. No need for GI consult currently - if he drops HgB or bc unstable, then we would consult GI. He was a mallory weiss tear and with his current vomiting, would be at risk for another tear.  3. N / V - he has known gastritis and gastroparesis both of which could be contributing to his sxs. He also uses cocaine which can further delay gastric emptying and a host of other negative GI effects. Tx symptomatically at this time with PPI, carafate, and reglan. EKG is negative for CAD. Trop I high in past so will not contribute to W/u.  4. Elevated Alk Phos - This has chronically been elevate at about 120. GGT confirms GI source. U/S negative so will order AMA to proceed with W/U.  5. Acute on chronic renal failure - with IVF, back to baseline of 1.33. This was likely 2/2 vol contraction from hyperglycemic diuresis and GI loses. Cont to follow.  6. Hypercalcemia - he  has ranged from hypocalcemia to hypercalcemia. Would prefer to wait until outpt and stable before deciding whether to W/U.  Overall prognosis is poor. His psychosocial issues inc drug use is preventing him from controlling his chronic medical issues which will cont to cause complications and more morbidity and mortality.  D/C depends on improvement rapidity, likely 1-3 days.  [name], MD 7/13/20171:42 PM

## 2015-10-21 NOTE — Progress Notes (Signed)
Initial Nutrition Assessment  DOCUMENTATION CODES:   Not applicable  INTERVENTION:  Provide Glucerna Shake po TID, each supplement provides 220 kcal and 10 grams of protein.  Encourage adequate PO intake.   NUTRITION DIAGNOSIS:   Inadequate oral intake related to poor appetite as evidenced by meal completion < 25%.  GOAL:   Patient will meet greater than or equal to 90% of their needs  MONITOR:   PO intake, Supplement acceptance, Weight trends, Labs, I & O's  REASON FOR ASSESSMENT:   Malnutrition Screening Tool    ASSESSMENT:   50 yo gentleman with DMII c/b neuropathy and b/l BKA, CKD stage 2 (baseline Cr 1.3-1.4), GERD with ulcerative esophagitis and history of MW tear, chronic EtOH pancreatitis, depression, HLD, HTN, chronic pyelonephritis with history renal/perinephric abscess 2010 who presents with hematemesis.  Meal completion has been 25%. Pt reports the burning sensation in his stomach has been improving. Pt reports eating well PTA with no other difficulties. Pt reports weight loss. Reason of weight loss unknown to patient. Usual body weight reported to be ~210 lbs back in December 2016. Pt with a 12% weight loss in 7 months. Noted pt consumed an entire bottle of Ensure at bedside. CBGs 130-386 mg/dL. RD to order Glucerna Shake instead to aid in caloric and protein needs. Pt educated on adequate protein intake and protein supplements for home.   Nutrition-Focused physical exam completed. Findings are no fat depletion, no muscle depletion, and no edema. Noted, anterior thigh region were not observed.  Labs and medications reviewed.   Diet Order:  Diet full liquid Room service appropriate?: Yes; Fluid consistency:: Thin  Skin:  Reviewed, no issues  Last BM:  Unknown  Height:   Ht Readings from Last 1 Encounters:  10/20/15 5\' 8"  (1.727 m)    Weight:   Wt Readings from Last 1 Encounters:  10/21/15 173 lb 11.6 oz (78.8 kg)    Ideal Body Weight:  60.9 kg  (adjusted for bilat BKA)  BMI:  Body mass index is 26.42 kg/(m^2).  Estimated Nutritional Needs:   Kcal:  1850-2100  Protein:  95-105 grams  Fluid:  1.8 - 2.1 L/day  EDUCATION NEEDS:   Education needs addressed  Corrin Parker, MS, RD, LDN Pager # [phone] After hours/ weekend pager # [phone]

## 2015-10-22 LAB — CBC
HCT: 31.8 % — ABNORMAL LOW (ref 39.0–52.0)
Hemoglobin: 9.9 g/dL — ABNORMAL LOW (ref 13.0–17.0)
MCH: 26.7 pg (ref 26.0–34.0)
MCHC: 31.1 g/dL (ref 30.0–36.0)
MCV: 85.7 fL (ref 78.0–100.0)
Platelets: 412 10*3/uL — ABNORMAL HIGH (ref 150–400)
RBC: 3.71 MIL/uL — AB (ref 4.22–5.81)
RDW: 15.2 % (ref 11.5–15.5)
WBC: 13.7 10*3/uL — ABNORMAL HIGH (ref 4.0–10.5)

## 2015-10-22 LAB — BASIC METABOLIC PANEL
Anion gap: 11 (ref 5–15)
BUN: 19 mg/dL (ref 6–20)
CO2: 20 mmol/L — ABNORMAL LOW (ref 22–32)
CREATININE: 1.38 mg/dL — AB (ref 0.61–1.24)
Calcium: 8.6 mg/dL — ABNORMAL LOW (ref 8.9–10.3)
Chloride: 101 mmol/L (ref 101–111)
GFR calc Af Amer: >60 mL/min (ref >60)
GFR calc non Af Amer: 58 mL/min — ABNORMAL LOW (ref >60)
Glucose, Bld: 278 mg/dL — ABNORMAL HIGH (ref 65–99)
POTASSIUM: 5.2 mmol/L — AB (ref 3.5–5.1)
SODIUM: 132 mmol/L — AB (ref 135–145)

## 2015-10-22 LAB — GLUCOSE, CAPILLARY
GLUCOSE-CAPILLARY: 213 mg/dL — AB (ref 65–99)
Glucose-Capillary: 322 mg/dL — ABNORMAL HIGH (ref 65–99)

## 2015-10-22 MED ORDER — FLUOXETINE HCL 20 MG PO CAPS
20.0000 mg | ORAL_CAPSULE | Freq: Every day | ORAL | Status: DC
  Administered 2015-10-22: 20 mg via ORAL
  Filled 2015-10-22: qty 1

## 2015-10-22 MED ORDER — ACCU-CHEK FASTCLIX LANCETS MISC: Status: AC

## 2015-10-22 MED ORDER — FLUOXETINE HCL 20 MG PO CAPS: 20.0000 mg | ORAL_CAPSULE | Freq: Every day | ORAL | Status: DC

## 2015-10-22 MED ORDER — PANTOPRAZOLE SODIUM 40 MG PO TBEC: 40.0000 mg | DELAYED_RELEASE_TABLET | Freq: Two times a day (BID) | ORAL | Status: AC

## 2015-10-22 MED ORDER — "INSULIN SYRINGE-NEEDLE U-100 31G X 5/16"" 1 ML MISC": Status: AC

## 2015-10-22 MED ORDER — ACCU-CHEK NANO SMARTVIEW W/DEVICE KIT: PACK | Status: AC

## 2015-10-22 MED ORDER — INSULIN GLARGINE 100 UNIT/ML ~~LOC~~ SOLN: 40.0000 [IU] | Freq: Every day | SUBCUTANEOUS | Status: AC

## 2015-10-22 MED ORDER — POLYETHYLENE GLYCOL 3350 17 G PO PACK: 17.0000 g | PACK | Freq: Every day | ORAL | Status: AC

## 2015-10-22 MED ORDER — GLUCOSE BLOOD VI STRP: ORAL_STRIP | Status: AC

## 2015-10-22 MED ORDER — METOCLOPRAMIDE HCL 10 MG PO TABS: 10.0000 mg | ORAL_TABLET | Freq: Three times a day (TID) | ORAL | Status: DC

## 2015-10-22 MED ORDER — INSULIN GLARGINE 100 UNIT/ML ~~LOC~~ SOLN
40.0000 [IU] | Freq: Every day | SUBCUTANEOUS | Status: DC
  Filled 2015-10-22: qty 0.4

## 2015-10-22 NOTE — Discharge Instructions (Signed)
You were hospitalized for Hyperosmotic Hyperglycemic State which is something that happens when you get dehydrated and your blood sugar is very elevated. Please take your Lantus 40U once a day and start taking your metformin again as prescribed. Please check your blood sugar every day and review the instructions below for more information. We restarted your Prozac (fluoxetine). Take this every day and should help with your mood in 4-6 weeks. Do not stop taking this medication without speaking to your doctor first. We stopped your Elavil (amitryptiline) which was for your mood and your neuropathy as this medication can make you constipated and affect your digestion. Take your iron daily and tell your doctor if you still have issues with your restless legs and neuropathy, as you may be able to take more gabapentin. We recommend following up with your anger management classes and any counseling you have access too. Stopping cocaine use will improve your mood and mood swings.  Blood Glucose Monitoring, Adult Monitoring your blood glucose (also know as blood sugar) helps you to manage your diabetes. It also helps you and your health care provider monitor your diabetes and determine how well your treatment plan is working. WHY SHOULD YOU MONITOR YOUR BLOOD GLUCOSE?  It can help you understand how food, exercise, and medicine affect your blood glucose.  It allows you to know what your blood glucose is at any given moment. You can quickly tell if you are having low blood glucose (hypoglycemia) or high blood glucose (hyperglycemia).  It can help you and your health care provider know how to adjust your medicines.  It can help you understand how to manage an illness or adjust medicine for exercise. WHEN SHOULD YOU TEST? Your health care provider will help you decide how often you should check your blood glucose. This may depend on the type of diabetes you have, your diabetes control, or the types of medicines you  are taking. Be sure to write down all of your blood glucose readings so that this information can be reviewed with your health care provider. See below for examples of testing times that your health care provider may suggest. Type 2 Diabetes  If you are taking insulin, test at least 2 times per day. However, it is best to test before every insulin injection.  If your diabetes is not well controlled or if you are sick, you may need to monitor more often. HOW TO MONITOR YOUR BLOOD GLUCOSE Supplies Needed  Blood glucose meter.  Test strips for your meter. Each meter has its own strips. You must use the strips that go with your own meter.  A pricking needle (lancet).  A device that holds the lancet (lancing device).  A journal or log book to write down your results. Procedure  Wash your hands with soap and water. Alcohol is not preferred.  Prick the side of your finger (not the tip) with the lancet.  Gently milk the finger until a small drop of blood appears.  Follow the instructions that come with your meter for inserting the test strip, applying blood to the strip, and using your blood glucose meter. Other Areas to Get Blood for Testing Some meters allow you to use other areas of your body (other than your finger) to test your blood. These areas are called alternative sites. The most common alternative sites are:  The forearm.  The thigh.  The back area of the lower leg.  The palm of the hand. The blood flow in these  areas is slower. Therefore, the blood glucose values you get may be delayed, and the numbers are different from what you would get from your fingers. Do not use alternative sites if you think you are having hypoglycemia. Your reading will not be accurate. Always use a finger if you are having hypoglycemia. Also, if you cannot feel your lows (hypoglycemia unawareness), always use your fingers for your blood glucose checks. ADDITIONAL TIPS FOR GLUCOSE MONITORING  Do  not reuse lancets.  Always carry your supplies with you.  All blood glucose meters have a 24-hour "hotline" number to call if you have questions or need help.  Adjust (calibrate) your blood glucose meter with a control solution after finishing a few boxes of strips. BLOOD GLUCOSE RECORD KEEPING It is a good idea to keep a daily record or log of your blood glucose readings. Most glucose meters, if not all, keep your glucose records stored in the meter. Some meters come with the ability to download your records to your home computer. Keeping a record of your blood glucose readings is especially helpful if you are wanting to look for patterns. Make notes to go along with the blood glucose readings because you might forget what happened at that exact time. Keeping good records helps you and your health care provider to work together to achieve good diabetes management.    This information is not intended to replace advice given to you by your health care provider. Make sure you discuss any questions you have with your health care provider.   Document Released: 03/30/2003 Document Revised: 04/17/2014 Document Reviewed: 08/19/2012 Elsevier Interactive Patient Education [employer].

## 2015-10-22 NOTE — Progress Notes (Signed)
Subjective: Patient feels better from a nausea perspective, feeling like Reglan and carafate are helping. He notes his appetite is returning and would like to advance his diet today. He notes his abdominal pain is improving, now 5/20, certainly worse when he eats. He had 2 glucerna shakes and a gingerale yesterday but little other PO intake. Patient notes he didn't have his diabetic equipment at home because his wife sold it for cocaine. He notes he has done cocaine since age 52, and successfully stopped several times, but feels like it has been particularly difficult lately due to the cocaine use of his wife. He also notes they are staying at a motel because his former apartment was "drug infested" and the opportunity to use was overwhelming them both. He notes he knows he needs to quit and will continue going to his anger management counseling sessions to help stabilize his social life better.    Objective: Vital signs in last 24 hours: Filed Vitals:   10/21/15 1613 10/21/15 2012 10/22/15 0603 10/22/15 0739  BP: 146/80 136/77 124/72 142/86  Pulse: 119 114 105 107  Temp: 99 F (37.2 C) 98.8 F (37.1 C) 98 F (36.7 C) 99 F (37.2 C)  TempSrc: Oral Oral Oral Oral  Resp: 17 18 18 18   Height:      Weight:  79.9 kg (176 lb 2.4 oz)    SpO2: 98% 97% 99% 98%   Weight change: -4.015 kg (-8 lb 13.6 oz)  Intake/Output Summary (Last 24 hours) at 10/22/15 1108 Last data filed at 10/22/15 0932  Gross per 24 hour  Intake 1301.4 ml  Output   1550 ml  Net -248.6 ml  Gen: pleasant gentleman in NAD lying in his hospital bed  HEENT: EOMI, anicteric sclera CV: RRR no m/r/g Pulm: normal effort CTAB Abdomen: soft, nontender, nondistended, no organomegaly Extremities: pulse 2+ b/l UE, b/l BKA amputation with no lesions or ulcers appreciated  Skin: horizontal well-healed scars on R forearm  Lab Results: @LABTEST2 @ Micro Results: No results found for this or any previous visit (from the past 240  hour(s)). Studies/Results: US Abdomen Limited Ruq  10/21/2015  CLINICAL DATA:  Right upper quadrant abdominal pain. Symptoms for 2 days. Previous left nephrectomy. EXAM: US ABDOMEN LIMITED - RIGHT UPPER QUADRANT COMPARISON:  04/27/2012 FINDINGS: Gallbladder: Gallbladder wall is thickened and mildly irregular, measuring 4.3 mm. A small polyp measures 3 mm. No definite stones or pericholecystic fluid. No sonographic Murphy's sign. Common bile duct: Diameter: 5.3 mm Liver: No focal lesion identified. Within normal limits in parenchymal echogenicity. IMPRESSION: 1. Nonspecific thickening of the gallbladder wall. 2. Small gallbladder polyp. 3. No cholelithiasis. Electronically Signed   By: Nolon Nations M.D.   On: 10/21/2015 09:00   Medications: I have reviewed the patient's current medications.  Scheduled Meds: . amitriptyline  50 mg Oral QHS  . amLODipine  10 mg Oral Daily  . atorvastatin  40 mg Oral q1800  . enoxaparin (LOVENOX) injection  40 mg Subcutaneous Q24H  . feeding supplement (GLUCERNA SHAKE)  237 mL Oral TID BM  . gabapentin  600 mg Oral TID  . insulin aspart  0-15 Units Subcutaneous TID WC  . insulin aspart  0-5 Units Subcutaneous QHS  . insulin glargine  30 Units Subcutaneous Daily  . lisinopril  20 mg Oral Daily  . metoCLOPramide (REGLAN) injection  10 mg Intravenous Q6H  . pantoprazole  40 mg Oral BID  . polyethylene glycol  17 g Oral Daily  . senna-docusate  1 tablet Oral Daily  . sucralfate  1 g Oral TID WC & HS   Continuous Infusions: . sodium chloride Stopped (10/21/15 0146)   PRN Meds:.acetaminophen, ondansetron (ZOFRAN) IV Assessment/Plan: Active Problems:   Type 2 diabetes mellitus with hyperosmolar nonketotic hyperglycemia ([facility])   Diabetic gastroparesis associated with type 2 diabetes mellitus ([place])  Type 2 diabetes mellitus with hyperosmolar nonketotic hyperglycemia ([place])  Mr. Mcmahen is a 50 yo gentleman with DMII c/b neuropathy and b/l BKA, CKD stage 2  (baseline Cr 1.3-1.4), GERD with ulcerative esophagitis and history of MW tear, chronic EtOH pancreatitis, depression, HLD, HTN, chronic pyelonephritis with history renal/perinephric abscess 2010 who presents with hematemesis.  HHS, AKI on CKD2: Likely secondary to dehydration with acute prerenal failure. Known history of uncontrolled diabetes with hospitalizations before for DKA. Electrolyte abnormalities have resolved with SCr now at baseline and BUN now normal. Remains mildly tachycardic today. CBG in 300s yesterday, will adjust regimen and discharge on consolidated basal dose. Patient with good PO fluid intake and appetite returning. Off insulin gtt. The amytriptiline can be exacerbating his gastroparesis which could be exacerbating erosive esophagitis below.  -- stop D5 1/2 NS @ 121m/hr  -- carb mod diet, advance as tolerated  -- Hold home metformin -- SSI, Increase lantus to 28u daily. -- continue home gabapentin -- stop amitriptyline   Erosive esophagitis: Improving from pain perspective, likely the cause of coffee ground emesis and source of his burning pain given relation with food. Known history of gastroparesis. Remains hemodynamically stable. Hgb at baseline. Takes home omeprazole.  -- continue pantoprazole 470mBID -- continue home carafate -- continue reglan 1096mID -- stop amitriptyline as above   Mild hypercalcemia: Ca corrected is 9.4. Down from 10.2. Resolved.  Elevated alkaline phosphatase: Elevated GGT. Alk phos is improving. RUQ US Koreath nonspecific thickening of the gallbladder wall and small gallbladder polyp. No cholelithiasis. This is likely due to DM which can cause moderate (<4x ULN) increase in alk phos. Chronic pancreatitis can also cause this and he has had mildly elevated and high-normal levels in the past year.He may have intrahepatic cholestasis, for which we will evaluate.  -- f/u AMA   Thrombocytosis: Plt 553 now 412. Likely reactive.    Constipation:  Blood in toilet likely from hemorrhoids versus fissures. -- continue senna-docusate 1 tablet daily -- continue Miralax daily  Chronic normocytic anemia: Hgb 12 on admission. Baseline around 9-10. FOBT negative. Underlying AOCI and CKD likely contributing to chronic anemia.   HTN: Normotensive. -- Continue home amlodipine 47m14mily and lisinopril 20mg25mly.  HLD:  -- Continue home Lipitor.  Polysubstance abuse: UDS positive for cocaine and THC. 19 pack-year tobacco use. Not interested in cessation.   VTE ppx: Lovenox Code: FULL  Dispo: Anticipated discharge in approximately 0 day(s).   This is a [employer] information officer.  The care of the patient was discussed with Dr. JenniJacques Earthlythe assessment and plan formulated with their assistance.  Please see their attached note for official documentation of the daily encounter.   LOS: 2 days   NicolEvert Kohl Student 10/22/2015, 11:08 AM

## 2015-10-22 NOTE — Progress Notes (Signed)
[name] to be D/C'd Home per MD order.  Discussed prescriptions and follow up appointments with the patient. Prescriptions given to patient, medication list explained in detail. Pt verbalized understanding.    Medication List    STOP taking these medications        amitriptyline 50 MG tablet  Commonly known as:  ELAVIL     aspirin 81 MG chewable tablet     EASY TOUCH PEN NEEDLES 29G X 12MM Misc  Generic drug:  Insulin Pen Needle     feeding supplement (GLUCERNA SHAKE) Liqd     folic acid 1 MG tablet  Commonly known as:  FOLVITE     insulin aspart 100 UNIT/ML injection  Commonly known as:  novoLOG     Omeprazole-Sodium Bicarbonate 20-1100 MG Caps capsule  Commonly known as:  ZEGERID  Replaced by:  pantoprazole 40 MG tablet      TAKE these medications        ACCU-CHEK FASTCLIX LANCETS Misc  Check blood sugar before meals and bedtime. Dx code: E11.8     ACCU-CHEK NANO SMARTVIEW w/Device Kit  Check blood sugar before meals and bedtime. Dx code: E11.8     amLODipine 10 MG tablet  Commonly known as:  NORVASC  Take 1 tablet (10 mg total) by mouth daily.     atorvastatin 40 MG tablet  Commonly known as:  LIPITOR  Take 1 tablet (40 mg total) by mouth daily at 6 PM.     diclofenac sodium 1 % Gel  Commonly known as:  VOLTAREN  Apply 4 g topically 4 (four) times daily.     ferrous sulfate 325 (65 FE) MG tablet  Take 1 tablet (325 mg total) by mouth daily with breakfast.     FLUoxetine 20 MG capsule  Commonly known as:  PROZAC  Take 1 capsule (20 mg total) by mouth daily.     gabapentin 600 MG tablet  Commonly known as:  NEURONTIN  Take 1 tablet (600 mg total) by mouth 3 (three) times daily.     glucose blood test strip  Commonly known as:  ACCU-CHEK SMARTVIEW  Check blood sugar before meals and bedtime. Dx code: E11.8     insulin glargine 100 UNIT/ML injection  Commonly known as:  LANTUS  Inject 0.4 mLs (40 Units total) into the skin at bedtime.     Insulin Syringe-Needle U-100 31G X 5/16" 1 ML Misc  Commonly known as:  EASY COMFORT INSULIN SYRINGE  Use to inject insulin daily     lisinopril 20 MG tablet  Commonly known as:  PRINIVIL,ZESTRIL  Take 1 tablet (20 mg total) by mouth daily.     metFORMIN 1000 MG tablet  Commonly known as:  GLUCOPHAGE  Take 1 tablet (1,000 mg total) by mouth 2 (two) times daily with a meal.     metoCLOPramide 10 MG tablet  Commonly known as:  REGLAN  Take 1 tablet (10 mg total) by mouth 4 (four) times daily -  before meals and at bedtime.     pantoprazole 40 MG tablet  Commonly known as:  PROTONIX  Take 1 tablet (40 mg total) by mouth 2 (two) times daily.     polyethylene glycol packet  Commonly known as:  MIRALAX / GLYCOLAX  Take 17 g by mouth daily.     senna-docusate 8.6-50 MG tablet  Commonly known as:  Senokot-S  Take 1 tablet by mouth daily.     sildenafil 20 MG tablet  Commonly  known as:  REVATIO  Take 2-5 tablets before sexual activity.     sucralfate 1 GM/10ML suspension  Commonly known as:  CARAFATE  Take 10 mLs (1 g total) by mouth 4 (four) times daily -  with meals and at bedtime.        Filed Vitals:   10/22/15 0739 10/22/15 1123  BP: 142/86 135/71  Pulse: 107 107  Temp: 99 F (37.2 C) 98.2 F (36.8 C)  Resp: 18 16    Skin clean, dry and intact without evidence of skin break down, no evidence of skin tears noted. IV catheter discontinued intact. Site without signs and symptoms of complications. Dressing and pressure applied. Pt denies pain at this time. No complaints noted.  An After Visit Summary was printed and given to the patient. Patient escorted via [place], and D/C home via private auto.  [name] C 10/22/2015 5:18 PM

## 2015-10-22 NOTE — Progress Notes (Addendum)
Inpatient Diabetes Program Recommendations  AACE/ADA: New Consensus Statement on Inpatient Glycemic Control (2015)  Target Ranges:  Prepandial:   less than 140 mg/dL      Peak postprandial:   less than 180 mg/dL (1-2 hours)      Critically ill patients:  140 - 180 mg/dL  Results for [name], [name] (MRN [id number]) as of 10/22/2015 11:29  Ref. Range 10/21/2015 07:37 10/21/2015 11:26 10/21/2015 16:12 10/21/2015 20:10 10/22/2015 07:37  Glucose-Capillary Latest Ref Range: 65-99 mg/dL 180 (H) 386 (H) 426 (H) 367 (H) 213 (H)   Review of Glycemic Control  Diabetes history: DM 2 Outpatient Diabetes medications: Lantus insulin 22 units bid + Novolog meal coverage 0-15 tid + Metformin 1 gm bid Current orders for Inpatient glycemic control: Lantus 30 units qd + Novolog correction 0-15 units tid + 0-5 units hs  Inpatient Diabetes Program Recommendations:  Noted elevated CBGs. Please consider increase in Lantus to 30 units bid and add meal coverage Novolog 5 units tid if eats > 50%. Pt. Received total of Lantus 54 units yesterday.  Thank you, Nani Gasser. Laxmi Choung, RN, MSN, CDE Inpatient Glycemic Control Team Team Pager [phone] (8am-5pm) 10/22/2015 11:32 AM

## 2015-10-22 NOTE — Progress Notes (Addendum)
   Subjective: He feels a lot better this morning. Tolerating PO intake. Abdominal burning sensation has improved.  Objective: Vital signs in last 24 hours: Filed Vitals:   10/21/15 2012 10/22/15 0603 10/22/15 0739 10/22/15 1123  BP: 136/77 124/72 142/86 135/71  Pulse: 114 105 107 107  Temp: 98.8 F (37.1 C) 98 F (36.7 C) 99 F (37.2 C) 98.2 F (36.8 C)  TempSrc: Oral Oral Oral Oral  Resp: 18 18 18 16   Height:      Weight: 176 lb 2.4 oz (79.9 kg)     SpO2: 97% 99% 98% 98%   Physical Exam General Apperance: NAD HEENT: Normocephalic, atraumatic, anicteric sclera Neck: Supple, trachea midline Lungs: Clear to auscultation bilaterally. No wheezes, rhonchi or rales. Breathing comfortably Heart: Regular rate and rhythm, no murmur/rub/gallop Abdomen: Soft, nontender, nondistended, no rebound/guarding Extremities: Warm and well perfused, no edema Skin: No rashes or lesions Neurologic: Alert and interactive. No gross deficits.  Assessment/Plan: 50 year old man with history of HTN, DM2 with peripheral neuropathy, history of diabetic foot ulcer and PVD s/p bilateral BKA, polysubstance abuse, HLD, ulcerative esophagitis, CKD2, depression presenting with vomiting found to have hyperglycemia.  HHS, AKI on CKD2: History of uncontrolled diabetes with hospitalizations before for DKA. Resolving. -Carb mod diet -Hold home metformin -SSI, Increase Lantus to 40units daily -Continue home gabapentin for peripheral neuropathy.  Erosive esophagitis, Diabetic gastroparesis:  -Pantoprazole 40mg  BID -Home carafate -Reglan 10mg  QID -Discontinue amitriptyline   Mild hypercalcemia: Resolved  Elevated alkaline phosphatase: RUQ [place] with nonspecific thickening of the gallbladder wall and small gallbladder polyp. No cholelithiasis. Mitochondrial antibody pending  Thrombocytosis: Plt 553. Possibly reactive. Improving  Constipation:  -Senna-docusate 1 tablet daily -Miralax daily  Chronic anemia:  Hgb 12 on admission. Baseline around 9-10. FOBT negative. Stable  HTN: Continue home amlodipine 10mg  daily and lisinopril 20mg  daily.  HLD: Continue home Lipitor.  Polysubstance abuse: UDS positive for cocaine and THC. Not interested in cessation  VTE ppx: Lovenox Code: FULL  Dispo: Likely home today  LOS: 2 days   Milagros Loll, MD 10/22/2015, 11:54 AM Pager: [phone]

## 2015-10-22 NOTE — Discharge Summary (Signed)
Name: [name] MRN: [id number] DOB: 1966-01-03 50 y.o. PCP: [name], MD  Date of Admission: 10/20/2015 12:13 PM Date of Discharge: 10/22/2015 Attending Physician: [name], MD  Discharge Diagnosis: Active Problems:   Type 2 diabetes mellitus with hyperosmolar nonketotic hyperglycemia ([place])   Diabetic gastroparesis associated with type 2 diabetes mellitus ([place])   Discharge Medications:   Medication List    STOP taking these medications        amitriptyline 50 MG tablet  Commonly known as:  ELAVIL     aspirin 81 MG chewable tablet     EASY TOUCH PEN NEEDLES 29G X 12MM Misc  Generic drug:  Insulin Pen Needle     feeding supplement (GLUCERNA SHAKE) Liqd     folic acid 1 MG tablet  Commonly known as:  FOLVITE     insulin aspart 100 UNIT/ML injection  Commonly known as:  novoLOG     Omeprazole-Sodium Bicarbonate 20-1100 MG Caps capsule  Commonly known as:  ZEGERID  Replaced by:  pantoprazole 40 MG tablet      TAKE these medications        ACCU-CHEK FASTCLIX LANCETS Misc  Check blood sugar before meals and bedtime. Dx code: E11.8     ACCU-CHEK NANO SMARTVIEW w/Device Kit  Check blood sugar before meals and bedtime. Dx code: E11.8     amLODipine 10 MG tablet  Commonly known as:  NORVASC  Take 1 tablet (10 mg total) by mouth daily.     atorvastatin 40 MG tablet  Commonly known as:  LIPITOR  Take 1 tablet (40 mg total) by mouth daily at 6 PM.     diclofenac sodium 1 % Gel  Commonly known as:  VOLTAREN  Apply 4 g topically 4 (four) times daily.     ferrous sulfate 325 (65 FE) MG tablet  Take 1 tablet (325 mg total) by mouth daily with breakfast.     FLUoxetine 20 MG capsule  Commonly known as:  PROZAC  Take 1 capsule (20 mg total) by mouth daily.     gabapentin 600 MG tablet  Commonly known as:  NEURONTIN  Take 1 tablet (600 mg total) by mouth 3 (three) times daily.     glucose blood test strip  Commonly known as:  ACCU-CHEK  SMARTVIEW  Check blood sugar before meals and bedtime. Dx code: E11.8     insulin glargine 100 UNIT/ML injection  Commonly known as:  LANTUS  Inject 0.4 mLs (40 Units total) into the skin at bedtime.     Insulin Syringe-Needle U-100 31G X 5/16" 1 ML Misc  Commonly known as:  EASY COMFORT INSULIN SYRINGE  Use to inject insulin daily     lisinopril 20 MG tablet  Commonly known as:  PRINIVIL,ZESTRIL  Take 1 tablet (20 mg total) by mouth daily.     metFORMIN 1000 MG tablet  Commonly known as:  GLUCOPHAGE  Take 1 tablet (1,000 mg total) by mouth 2 (two) times daily with a meal.     metoCLOPramide 10 MG tablet  Commonly known as:  REGLAN  Take 1 tablet (10 mg total) by mouth 4 (four) times daily -  before meals and at bedtime.     pantoprazole 40 MG tablet  Commonly known as:  PROTONIX  Take 1 tablet (40 mg total) by mouth 2 (two) times daily.     polyethylene glycol packet  Commonly known as:  MIRALAX / GLYCOLAX  Take 17 g by mouth daily.  senna-docusate 8.6-50 MG tablet  Commonly known as:  Senokot-S  Take 1 tablet by mouth daily.     sildenafil 20 MG tablet  Commonly known as:  REVATIO  Take 2-5 tablets before sexual activity.     sucralfate 1 GM/10ML suspension  Commonly known as:  CARAFATE  Take 10 mLs (1 g total) by mouth 4 (four) times daily -  with meals and at bedtime.        Disposition and follow-up:   [name] was discharged from [facility] in Stable condition.  At the hospital follow up visit please address:  1.  Type 2 diabetes mellitus with hyperosmolar nonketotic hyperglycemia:  Patient discharged with consolidated Lantus 40U daily.  Diabetic gastroparesis associated with type 2 diabetes mellitus, Erosive Esophagitis: Takes home omeprazole 54m which was changed to Protonix 40 mg BID in hospital. Home Carafate was continued. The amitriptyline could be exacerbating his gastroparesis which could be exacerbating erosive  esophagitis below so this was stopped. We recommend increasing gabapentin dose or considering duloxetine for neuropathy. Given Reglan 10 mg up to 4 times daily 30 minutes before meals or food and at bedtime for up to 8 weeks. Treatment >12 weeks is not recommended   Mild hypercalcemia: Corrected Calcium 10.5 on admission which decreased to normal after volume resuscitation. We recommend recheck and further evaluation as outpatient.   Thrombocytosis: Plt 553 on admission with decrease to 412 on discharge. This was likely reactive, we recommend follow up CBC as outpatient.    Major depressive disorder: Restarted fluoxetine, as patient notes this worked well for him in the past.   2.  Labs / imaging needed at time of follow-up: BMP, CBC  3.  Pending labs/ test needing follow-up: None  Follow-up Appointments:     Follow-up Information    Follow up with SBurgess Estelle MD. Go on 11/01/2015.   Specialty:  Internal Medicine   Why:  Appointment at 2:15 PM for hospital follow up    Contact information:   [place] [id number]:   1. Type 2 diabetes mellitus with hyperosmolar nonketotic hyperglycemia: Patient presented with nausea, coffee ground emesis and generalized abdominal pain. Admission labs notable for for BG 870, with UA showing no ketones, glucose>1000, VBG showing mild metabolic alkalosis 70.17/51/02, Hyponatremia 129, Hyperkalemia 5.4, BUN 30, SCr 1.70, elevated Alk Phos 235, elevated GGT 138 with lipase, AST, ALT, TBili WNL. Patient noted not checking his blood sugar the previous 2 weeks, though taking all medication, because he had no strips. Additionally, he notes he was incarcerated before that and they did not manage his diabetes. Given labs and history, he most likely experienced worsening hyperglycemia with osmotic diuresis leading to HHS with AKI. He has known history of uncontrolled diabetes with hospitalizations  before for DKA. Patient was volume resuscitated and treated with insulin gtt, electrolyte abnormalities have resolved with SCr now at baseline and BUN now normal. Patient with good PO fluid intake and appetite returning on discharge. Patient discharged with consolidated Lantus 40U daily with close follow-up and re-evaluation as outpatient. Home metformin was held and restarted on discharge  2. Diabetic gastroparesis associated with type 2 diabetes mellitus, Erosive Esophagitis: Known history of erosive esophagitis seen on EGD. Remained hemodynamically with stable Hgb. Takes home omeprazole 274mwhich was changed to Protonix 40 mg BID in hospital. Home Carafate was continued and nausea was controlled with scheduled  Reglan. The amitriptyline could be exacerbating his gastroparesis which could be exacerbating erosive esophagitis below so this was stopped. We recommend increasing gabapentin dose or considering duloxetine for neuropathy.   3. Mild hypercalcemia: Corrected Calcium 10.5 on admission which decreased to normal after volume resuscitation. We recommend recheck and further evaluation as outpatient.   4. Elevated alkaline phosphatase: Elevated GGT as well suggesting biliary pathology. Alk phos decreased during admission. RUQ [place] with nonspecific thickening of the gallbladder wall and small gallbladder polyp. No cholelithiasis. This is likely due to DM which can cause moderate (<4x ULN) increase in alk phos. Chronic pancreatitis can also cause this, though US showed normal biliary structures. He has had mildly elevated and high-normal levels in the past year. AMA neg.   5. Thrombocytosis: Plt 553 on admission with decrease to 412 on discharge. This was likely reactive, we recommend follow up CBC as outpatient.    6. Major depressive disorder: Patient notes change from fluoxetine to amitriptyline has yielded worsening mood. We stopped amitriptyline and restarted fluoxetine, as patient notes this worked  well for him in the past.    Discharge Vitals:   BP 135/71 mmHg  Pulse 107  Temp(Src) 98.2 F (36.8 C) (Oral)  Resp 16  Ht _0  (1.727 m)  Wt 176 lb 2.4 oz (79.9 kg)  BMI 26.79 kg/m2  SpO2 98%  Pertinent Labs, Studies, and Procedures:  BMP Latest Ref Rng 10/22/2015 10/21/2015 10/21/2015  Glucose 65 - 99 mg/dL 278(H) 190(H) 131(H)  BUN 6 - 20 mg/dL 19 19 22(H)  Creatinine 0.61 - 1.24 mg/dL 1.38(H) 1.33(H) 1.42(H)  Sodium 135 - 145 mmol/L 132(L) 139 141  Potassium 3.5 - 5.1 mmol/L 5.2(H) 4.8 4.4  Chloride 101 - 111 mmol/L 101 105 108  CO2 22 - 32 mmol/L 20(L) 26 26  Calcium 8.9 - 10.3 mg/dL 8.6(L) 9.3 9.8    CBC Latest Ref Rng 10/22/2015 10/21/2015 10/20/2015  WBC 4.0 - 10.5 K/uL 13.7(H) 25.0(H) 19.2(H)  Hemoglobin 13.0 - 17.0 g/dL 9.9(L) 10.7(L) 12.0(L)  Hematocrit 39.0 - 52.0 % 31.8(L) 33.4(L) 37.8(L)  Platelets 150 - 400 K/uL 412(H) 529(H) 553(H)    US ABDOMEN LIMITED - RIGHT UPPER QUADRANT  COMPARISON: 04/27/2012  FINDINGS: Gallbladder:  Gallbladder wall is thickened and mildly irregular, measuring 4.3 mm. A small polyp measures 3 mm. No definite stones or pericholecystic fluid. No sonographic Murphy's sign.  Common bile duct:  Diameter: 5.3 mm  Liver:  No focal lesion identified. Within normal limits in parenchymal echogenicity.  IMPRESSION: 1. Nonspecific thickening of the gallbladder wall. 2. Small gallbladder polyp. 3. No cholelithiasis.  Discharge Instructions: Discharge Instructions    Call MD for:  difficulty breathing, headache or visual disturbances    Complete by:  As directed      Call MD for:  persistant dizziness or light-headedness    Complete by:  As directed      Call MD for:  persistant nausea and vomiting    Complete by:  As directed      Call MD for:  severe uncontrolled pain    Complete by:  As directed      Call MD for:  temperature >100.4    Complete by:  As directed      Diet - low sodium heart healthy    Complete by:   As directed      Discharge instructions    Complete by:  As directed   Stop taking amitripyline. Take your Lantus once a day at  bedtime - 30 units. Check your blood sugars before meals every day and bring your meter to your follow up appointment. Start taking Prozac once a day for your depression. If you have any questions or concerns, please call the clinic at [phone] or if it is after hours, call [phone] and ask for the internal medicine resident on call. Take 1 tablet of Reglan up to 4 times daily 30 minutes before meals and at bedtime for 8 weeks. Stop taking Zegerid and start taking Protonix twice a day instead. Restart your carafate.     Increase activity slowly    Complete by:  As directed            Signed: Milagros Loll, MD 10/22/2015, 2:58 PM   Pager: [phone]

## 2015-10-24 LAB — MITOCHONDRIAL ANTIBODIES: MITOCHONDRIAL M2 AB, IGG: 6.4 U (ref 0.0–20.0)

## 2015-10-29 ENCOUNTER — Other Ambulatory Visit: Payer: Self-pay | Admitting: *Deleted

## 2015-10-29 MED ORDER — ATORVASTATIN CALCIUM 40 MG PO TABS: 40.0000 mg | ORAL_TABLET | Freq: Every day | ORAL | Status: DC

## 2015-11-01 ENCOUNTER — Encounter: Payer: Self-pay | Admitting: Internal Medicine

## 2015-11-01 ENCOUNTER — Ambulatory Visit: Payer: Medicaid Other | Admitting: Internal Medicine

## 2015-11-10 ENCOUNTER — Encounter (HOSPITAL_COMMUNITY): Payer: Self-pay | Admitting: Emergency Medicine

## 2015-11-10 ENCOUNTER — Observation Stay (HOSPITAL_COMMUNITY)
Admission: EM | Admit: 2015-11-10 | Discharge: 2015-11-12 | Disposition: A | Discharge status: Home / Self Care | Payer: Medicaid Other | Attending: Student in an Organized Health Care Education/Training Program | Admitting: Student in an Organized Health Care Education/Training Program

## 2015-11-10 DIAGNOSIS — F1410 Cocaine abuse, uncomplicated: Secondary | ICD-10-CM | POA: Diagnosis not present

## 2015-11-10 DIAGNOSIS — Z794 Long term (current) use of insulin: Secondary | ICD-10-CM | POA: Insufficient documentation

## 2015-11-10 DIAGNOSIS — K2210 Ulcer of esophagus without bleeding: Secondary | ICD-10-CM | POA: Diagnosis not present

## 2015-11-10 DIAGNOSIS — N182 Chronic kidney disease, stage 2 (mild): Secondary | ICD-10-CM | POA: Diagnosis not present

## 2015-11-10 DIAGNOSIS — D509 Iron deficiency anemia, unspecified: Secondary | ICD-10-CM | POA: Diagnosis present

## 2015-11-10 DIAGNOSIS — Y838 Other surgical procedures as the cause of abnormal reaction of the patient, or of later complication, without mention of misadventure at the time of the procedure: Secondary | ICD-10-CM | POA: Insufficient documentation

## 2015-11-10 DIAGNOSIS — Z89511 Acquired absence of right leg below knee: Secondary | ICD-10-CM | POA: Insufficient documentation

## 2015-11-10 DIAGNOSIS — F17200 Nicotine dependence, unspecified, uncomplicated: Secondary | ICD-10-CM | POA: Diagnosis not present

## 2015-11-10 DIAGNOSIS — Z9119 Patient's noncompliance with other medical treatment and regimen: Secondary | ICD-10-CM | POA: Insufficient documentation

## 2015-11-10 DIAGNOSIS — T8789 Other complications of amputation stump: Secondary | ICD-10-CM | POA: Insufficient documentation

## 2015-11-10 DIAGNOSIS — E118 Type 2 diabetes mellitus with unspecified complications: Secondary | ICD-10-CM

## 2015-11-10 DIAGNOSIS — IMO0002 Reserved for concepts with insufficient information to code with codable children: Secondary | ICD-10-CM | POA: Diagnosis present

## 2015-11-10 DIAGNOSIS — Z905 Acquired absence of kidney: Secondary | ICD-10-CM | POA: Insufficient documentation

## 2015-11-10 DIAGNOSIS — I129 Hypertensive chronic kidney disease with stage 1 through stage 4 chronic kidney disease, or unspecified chronic kidney disease: Secondary | ICD-10-CM

## 2015-11-10 DIAGNOSIS — N189 Chronic kidney disease, unspecified: Secondary | ICD-10-CM

## 2015-11-10 DIAGNOSIS — F329 Major depressive disorder, single episode, unspecified: Secondary | ICD-10-CM | POA: Diagnosis not present

## 2015-11-10 DIAGNOSIS — E1122 Type 2 diabetes mellitus with diabetic chronic kidney disease: Secondary | ICD-10-CM | POA: Diagnosis not present

## 2015-11-10 DIAGNOSIS — E1143 Type 2 diabetes mellitus with diabetic autonomic (poly)neuropathy: Secondary | ICD-10-CM | POA: Diagnosis not present

## 2015-11-10 DIAGNOSIS — N179 Acute kidney failure, unspecified: Secondary | ICD-10-CM | POA: Diagnosis not present

## 2015-11-10 DIAGNOSIS — K3184 Gastroparesis: Secondary | ICD-10-CM | POA: Diagnosis not present

## 2015-11-10 DIAGNOSIS — E785 Hyperlipidemia, unspecified: Secondary | ICD-10-CM | POA: Diagnosis present

## 2015-11-10 DIAGNOSIS — E1165 Type 2 diabetes mellitus with hyperglycemia: Secondary | ICD-10-CM | POA: Diagnosis not present

## 2015-11-10 DIAGNOSIS — I10 Essential (primary) hypertension: Secondary | ICD-10-CM | POA: Diagnosis present

## 2015-11-10 DIAGNOSIS — E1151 Type 2 diabetes mellitus with diabetic peripheral angiopathy without gangrene: Secondary | ICD-10-CM

## 2015-11-10 DIAGNOSIS — Z89512 Acquired absence of left leg below knee: Secondary | ICD-10-CM | POA: Insufficient documentation

## 2015-11-10 DIAGNOSIS — L89893 Pressure ulcer of other site, stage 3: Secondary | ICD-10-CM | POA: Diagnosis not present

## 2015-11-10 DIAGNOSIS — L8993 Pressure ulcer of unspecified site, stage 3: Secondary | ICD-10-CM

## 2015-11-10 DIAGNOSIS — F1210 Cannabis abuse, uncomplicated: Secondary | ICD-10-CM

## 2015-11-10 DIAGNOSIS — E1142 Type 2 diabetes mellitus with diabetic polyneuropathy: Secondary | ICD-10-CM | POA: Insufficient documentation

## 2015-11-10 DIAGNOSIS — F1910 Other psychoactive substance abuse, uncomplicated: Secondary | ICD-10-CM | POA: Diagnosis present

## 2015-11-10 HISTORY — DX: Acquired absence of unspecified leg below knee: Z89.519

## 2015-11-10 HISTORY — DX: Reserved for concepts with insufficient information to code with codable children: IMO0002

## 2015-11-10 LAB — COMPREHENSIVE METABOLIC PANEL
ALT: 15 U/L — AB (ref 17–63)
AST: 15 U/L (ref 15–41)
Albumin: 4.2 g/dL (ref 3.5–5.0)
Alkaline Phosphatase: 129 U/L — ABNORMAL HIGH (ref 38–126)
Anion gap: 13 (ref 5–15)
BUN: 14 mg/dL (ref 6–20)
CHLORIDE: 102 mmol/L (ref 101–111)
CO2: 23 mmol/L (ref 22–32)
CREATININE: 1.51 mg/dL — AB (ref 0.61–1.24)
Calcium: 10 mg/dL (ref 8.9–10.3)
GFR calc non Af Amer: 52 mL/min — ABNORMAL LOW (ref >60)
Glucose, Bld: 438 mg/dL — ABNORMAL HIGH (ref 65–99)
POTASSIUM: 3.9 mmol/L (ref 3.5–5.1)
SODIUM: 138 mmol/L (ref 135–145)
Total Bilirubin: 0.4 mg/dL (ref 0.3–1.2)
Total Protein: 8.9 g/dL — ABNORMAL HIGH (ref 6.5–8.1)

## 2015-11-10 LAB — CBC
HEMATOCRIT: 39.9 % (ref 39.0–52.0)
Hemoglobin: 12.9 g/dL — ABNORMAL LOW (ref 13.0–17.0)
MCH: 28.4 pg (ref 26.0–34.0)
MCHC: 32.3 g/dL (ref 30.0–36.0)
MCV: 87.7 fL (ref 78.0–100.0)
PLATELETS: 577 10*3/uL — AB (ref 150–400)
RBC: 4.55 MIL/uL (ref 4.22–5.81)
RDW: 14.8 % (ref 11.5–15.5)
WBC: 11 10*3/uL — ABNORMAL HIGH (ref 4.0–10.5)

## 2015-11-10 LAB — URINALYSIS, ROUTINE W REFLEX MICROSCOPIC
Bilirubin Urine: NEGATIVE
HGB URINE DIPSTICK: NEGATIVE
KETONES UR: 15 mg/dL — AB
LEUKOCYTES UA: NEGATIVE
Nitrite: NEGATIVE
PH: 5.5 (ref 5.0–8.0)
PROTEIN: 30 mg/dL — AB
Specific Gravity, Urine: 1.04 — ABNORMAL HIGH (ref 1.005–1.030)

## 2015-11-10 LAB — CBG MONITORING, ED
GLUCOSE-CAPILLARY: 430 mg/dL — AB (ref 65–99)
Glucose-Capillary: 313 mg/dL — ABNORMAL HIGH (ref 65–99)

## 2015-11-10 LAB — TYPE AND SCREEN
ABO/RH(D): O POS
Antibody Screen: NEGATIVE

## 2015-11-10 LAB — BASIC METABOLIC PANEL
ANION GAP: 9 (ref 5–15)
BUN: 15 mg/dL (ref 6–20)
CALCIUM: 9 mg/dL (ref 8.9–10.3)
CO2: 22 mmol/L (ref 22–32)
CREATININE: 1.34 mg/dL — AB (ref 0.61–1.24)
Chloride: 110 mmol/L (ref 101–111)
Glucose, Bld: 251 mg/dL — ABNORMAL HIGH (ref 65–99)
Potassium: 3.8 mmol/L (ref 3.5–5.1)
SODIUM: 141 mmol/L (ref 135–145)

## 2015-11-10 LAB — GLUCOSE, CAPILLARY
GLUCOSE-CAPILLARY: 218 mg/dL — AB (ref 65–99)
GLUCOSE-CAPILLARY: 187 mg/dL — AB (ref 65–99)
GLUCOSE-CAPILLARY: 317 mg/dL — AB (ref 65–99)
GLUCOSE-CAPILLARY: 203 mg/dL — AB (ref 65–99)
Glucose-Capillary: 374 mg/dL — ABNORMAL HIGH (ref 65–99)

## 2015-11-10 LAB — LIPASE, BLOOD: LIPASE: 80 U/L — AB (ref 11–51)

## 2015-11-10 LAB — URINE MICROSCOPIC-ADD ON

## 2015-11-10 MED ORDER — PANTOPRAZOLE SODIUM 40 MG PO TBEC
40.0000 mg | DELAYED_RELEASE_TABLET | Freq: Two times a day (BID) | ORAL | Status: DC
  Administered 2015-11-10 – 2015-11-12 (×5): 40 mg via ORAL
  Filled 2015-11-10 (×5): qty 1

## 2015-11-10 MED ORDER — SODIUM CHLORIDE 0.9 % IV BOLUS (SEPSIS)
1000.0000 mL | Freq: Once | INTRAVENOUS | Status: AC
  Administered 2015-11-10: 1000 mL via INTRAVENOUS

## 2015-11-10 MED ORDER — FLUOXETINE HCL 20 MG PO CAPS
20.0000 mg | ORAL_CAPSULE | Freq: Every day | ORAL | Status: DC
  Administered 2015-11-10 – 2015-11-12 (×3): 20 mg via ORAL
  Filled 2015-11-10 (×3): qty 1

## 2015-11-10 MED ORDER — INSULIN GLARGINE 100 UNIT/ML ~~LOC~~ SOLN
40.0000 [IU] | Freq: Every day | SUBCUTANEOUS | Status: DC
  Filled 2015-11-10: qty 0.4

## 2015-11-10 MED ORDER — AMLODIPINE BESYLATE 10 MG PO TABS
10.0000 mg | ORAL_TABLET | Freq: Every day | ORAL | Status: DC
  Administered 2015-11-10 – 2015-11-12 (×3): 10 mg via ORAL
  Filled 2015-11-10 (×3): qty 1

## 2015-11-10 MED ORDER — ATORVASTATIN CALCIUM 40 MG PO TABS
40.0000 mg | ORAL_TABLET | Freq: Every day | ORAL | Status: DC
  Administered 2015-11-10 – 2015-11-11 (×2): 40 mg via ORAL
  Filled 2015-11-10 (×2): qty 1

## 2015-11-10 MED ORDER — METOCLOPRAMIDE HCL 5 MG/ML IJ SOLN
10.0000 mg | Freq: Once | INTRAMUSCULAR | Status: AC
  Administered 2015-11-10: 10 mg via INTRAVENOUS
  Filled 2015-11-10: qty 2

## 2015-11-10 MED ORDER — METOCLOPRAMIDE HCL 10 MG PO TABS: 10.0000 mg | ORAL_TABLET | Freq: Three times a day (TID) | ORAL | Status: DC

## 2015-11-10 MED ORDER — ONDANSETRON HCL 4 MG/2ML IJ SOLN: 4.0000 mg | Freq: Four times a day (QID) | INTRAMUSCULAR | Status: DC | PRN

## 2015-11-10 MED ORDER — MORPHINE SULFATE (PF) 4 MG/ML IV SOLN
4.0000 mg | Freq: Once | INTRAVENOUS | Status: AC
  Administered 2015-11-10: 4 mg via INTRAVENOUS
  Filled 2015-11-10: qty 1

## 2015-11-10 MED ORDER — INSULIN GLARGINE 100 UNIT/ML ~~LOC~~ SOLN
50.0000 [IU] | Freq: Every day | SUBCUTANEOUS | Status: DC
  Administered 2015-11-10: 50 [IU] via SUBCUTANEOUS
  Filled 2015-11-10 (×2): qty 0.5

## 2015-11-10 MED ORDER — INSULIN ASPART 100 UNIT/ML ~~LOC~~ SOLN
8.0000 [IU] | Freq: Once | SUBCUTANEOUS | Status: AC
  Administered 2015-11-10: 8 [IU] via INTRAVENOUS
  Filled 2015-11-10: qty 1

## 2015-11-10 MED ORDER — GI COCKTAIL ~~LOC~~
30.0000 mL | Freq: Once | ORAL | Status: AC
  Administered 2015-11-10: 30 mL via ORAL
  Filled 2015-11-10: qty 30

## 2015-11-10 MED ORDER — DEXTROSE-NACL 5-0.45 % IV SOLN
INTRAVENOUS | Status: DC
  Administered 2015-11-10 (×2): via INTRAVENOUS

## 2015-11-10 MED ORDER — MORPHINE SULFATE (PF) 2 MG/ML IV SOLN
2.0000 mg | INTRAVENOUS | Status: DC | PRN
Start: 2015-11-10 — End: 2015-11-10

## 2015-11-10 MED ORDER — SODIUM CHLORIDE 0.9 % IV SOLN
INTRAVENOUS | Status: DC
  Administered 2015-11-10: 08:00:00 via INTRAVENOUS

## 2015-11-10 MED ORDER — METOCLOPRAMIDE HCL 10 MG PO TABS
10.0000 mg | ORAL_TABLET | Freq: Four times a day (QID) | ORAL | Status: DC
  Administered 2015-11-10 – 2015-11-12 (×7): 10 mg via ORAL
  Filled 2015-11-10 (×7): qty 1

## 2015-11-10 MED ORDER — ENOXAPARIN SODIUM 40 MG/0.4ML ~~LOC~~ SOLN
40.0000 mg | SUBCUTANEOUS | Status: DC
  Administered 2015-11-10 – 2015-11-12 (×3): 40 mg via SUBCUTANEOUS
  Filled 2015-11-10 (×3): qty 0.4

## 2015-11-10 MED ORDER — INSULIN ASPART 100 UNIT/ML ~~LOC~~ SOLN
0.0000 [IU] | SUBCUTANEOUS | Status: DC
  Administered 2015-11-10: 15 [IU] via SUBCUTANEOUS
  Administered 2015-11-10 (×2): 5 [IU] via SUBCUTANEOUS
  Administered 2015-11-10: 11 [IU] via SUBCUTANEOUS
  Administered 2015-11-10: 3 [IU] via SUBCUTANEOUS
  Administered 2015-11-11: 8 [IU] via SUBCUTANEOUS
  Administered 2015-11-11: 11 [IU] via SUBCUTANEOUS
  Administered 2015-11-11: 8 [IU] via SUBCUTANEOUS
  Administered 2015-11-12: 10 [IU] via SUBCUTANEOUS

## 2015-11-10 MED ORDER — ONDANSETRON HCL 4 MG/2ML IJ SOLN
4.0000 mg | Freq: Once | INTRAMUSCULAR | Status: AC
  Administered 2015-11-10: 4 mg via INTRAVENOUS
  Filled 2015-11-10: qty 2

## 2015-11-10 MED ORDER — GABAPENTIN 600 MG PO TABS
600.0000 mg | ORAL_TABLET | Freq: Three times a day (TID) | ORAL | Status: DC
  Administered 2015-11-10 – 2015-11-12 (×7): 600 mg via ORAL
  Filled 2015-11-10 (×7): qty 1

## 2015-11-10 MED ORDER — SUCRALFATE 1 GM/10ML PO SUSP
1.0000 g | Freq: Three times a day (TID) | ORAL | Status: DC
Start: 2015-11-10 — End: 2015-11-12
  Administered 2015-11-10 – 2015-11-12 (×10): 1 g via ORAL
  Filled 2015-11-10 (×10): qty 10

## 2015-11-10 MED ORDER — PROMETHAZINE HCL 25 MG/ML IJ SOLN
12.5000 mg | Freq: Once | INTRAMUSCULAR | Status: AC
  Administered 2015-11-10: 12.5 mg via INTRAVENOUS
  Filled 2015-11-10: qty 1

## 2015-11-10 NOTE — Progress Notes (Signed)
Patient ID: [name], male   DOB: [birth year], 50 y.o.   MRN: [id number]  Medication Samples have been provided to the patient.  Drug name: Lantus       Strength: 100 units/ml        Qty: 1 pen (3 ml)  LOT: [id number]  Exp.Date: 11/2017  Dosing instructions: Inject 40 units under the skin every night at bedtime.  The patient has been instructed regarding the correct time, dose, and frequency of taking this medication, including desired effects and most common side effects.   Belia Heman, PharmD PGY1 Pharmacy Resident [phone] (Pager) 11/10/2015 2:09 PM

## 2015-11-10 NOTE — ED Notes (Signed)
Admitting provider at bedside.

## 2015-11-10 NOTE — Progress Notes (Signed)
Received report from RN in ED for transfer of pt to [id number].

## 2015-11-10 NOTE — ED Provider Notes (Signed)
[facility] DEPT Provider Note   CSN: [id number] Arrival date & time: 11/10/15  0020  First Provider Contact:  None    By signing my name below, I, Arianna Nassar, attest that this documentation has been prepared under the direction and in the presence of Veryl Speak, MD.  Electronically Signed: Julien Nordmann, ED Scribe. 11/10/15. 1:02 AM.    History   Chief Complaint Chief Complaint  Patient presents with  . Abdominal Pain   The history is provided by the patient and the EMS personnel. No language interpreter was used.   HPI Comments: [name] is a 50 y.o. male brought in by ambulance, who has a PMhx of alcoholic pancreatitis, diabetic foot ulcer, HLD, DMII, and HTN presents to the Emergency Department complaining of recurrent, gradual worsening, moderate sore-like generalized abdominal pain onset yesterday. He also notes associated nausea and vomiting. Pt says that he feels very dehydrated. Pt has a hx of gastric ulcers and says that he was recently admitted to the hospital for the same reason. Pt notes he has not been compliant in checking his blood sugar levels. He has not been around any sick contacts. He denies hematemesis or diarrhea.  Past Medical History:  Diagnosis Date  . Alcoholic pancreatitis 04/930  . Cellulitis and abscess of leg 01/31/2014  . Cellulitis of left foot 03/2008   left 4th and 5th metatarsal area  . CKD (chronic kidney disease) stage 2, GFR 60-89 ml/min    BL SCr 1.3-1.4  . Depression   . Diabetic foot ulcer ([place])    s/p Right first ray amputation, left transmetatarsal amputation with revision  . Diabetic peripheral neuropathy ([name])   . Exposure to trichomonas    treated empirically  . GERD (gastroesophageal reflux disease)   . History of chronic pyelonephritis    secondary to left pyeloureteral junction obstruction.  Marland Kitchen History of drug abuse    Cocaine and marijuana  . History of prolonged Q-T interval on ECG   . Hyperlipidemia   .  Hypertension   . Mallory [name] tear April 2009  . Onychomycosis   . Osteomyelitis of left leg ([place]) 01/21/2014  . Peripheral vascular disease ([place])   . Recurrent Foot Osteomyelitis 02/19/2012   Recurrent osteomyelitis of toes with multiple ray amputations Follows up with Dr Sharol Given   . Renal and perinephric abscess 09/2008   s/p left nephrectomy,massive Left pyonephrosis, s/p 2L pus drained via percutaneous   . Status post below knee amputation of right lower extremity ([place]) 03/23/2014  . Type 2 diabetes mellitus, uncontrolled, with renal complications ([place])   . Ulcerative esophagitis 3557   severe, complicated with UGI bleed    Patient Active Problem List   Diagnosis Date Noted  . Diabetic gastroparesis associated with type 2 diabetes mellitus ([place])   . Type 2 diabetes mellitus with hyperosmolar nonketotic hyperglycemia ([place]) 10/20/2015  . Esophagitis   . Left shoulder pain 08/26/2015  . DKA (diabetic ketoacidoses) ([place]) 06/01/2015  . Acute-on-chronic kidney injury ([place]) 06/01/2015  . Diabetic ketoacidosis without coma ([place])   . Gastritis   . Intractable vomiting 03/12/2015  . Hyperglycemia due to type 2 diabetes mellitus ([place]) 03/12/2015  . Anemia 02/01/2015  . S/P bilateral BKA (below knee amputation) ([place]) 10/26/2014  . Hematemesis 10/26/2014  . Esophagitis (distal), erosive (09/2014) 09/16/2014    Class: Hospitalized for  . Phantom limb pain ([place]) 06/17/2014  . CN (constipation) 05/26/2014  . Erectile dysfunction 05/26/2014  . Status post below knee amputation of  right lower extremity ([place]) 03/23/2014  . Anemia, iron deficiency 02/04/2014  . History of left below knee amputation ([place]) 01/21/2014  . Below knee amputation status ([place]) 12/12/2013  . S/P Right BKA  10/09/2012  . Hyperlipedemia 06/24/2012  . GERD (gastroesophageal reflux disease) 03/13/2012  . Depression 09/05/2011  . Marginal Housing 03/28/2011  . Essential hypertension 09/22/2008  . Diabetic neuropathy,  painful ([place]) 07/21/2008  . CKD Stage 2 07/21/2008  . TOBACCO ABUSE 01/03/2006  . Polysubstance abuse 01/03/2006  . Diabetes mellitus type 2, uncontrolled, with complications ([place]) 09/38/1829    Past Surgical History:  Procedure Laterality Date  . AMPUTATION  08/24/2011   Procedure: AMPUTATION RAY;  Surgeon: Meredith Pel, MD;  Location: [facility];  Service: Orthopedics;  Laterality: Left;  left fourth toe Ray Resection  . AMPUTATION  12/05/2011   Procedure: AMPUTATION RAY;  Surgeon: Meredith Pel, MD;  Location: [facility];  Service: Orthopedics;  Laterality: Left;  . AMPUTATION  02/18/2012   Procedure: AMPUTATION DIGIT;  Surgeon: Mcarthur Rossetti, MD;  Location: [facility];  Service: Orthopedics;  Laterality: Left;  revison transmetatarsal  . AMPUTATION Right 09/01/2012   Procedure: FOOT 1ST RAY AMPUTATION;  Surgeon: Newt Minion, MD;  Location: [facility];  Service: Orthopedics;  Laterality: Right;  . AMPUTATION Left 09/01/2012   Procedure: FOOT REVISION TRANSMETATARSAL AMPUTATION ;  Surgeon: Newt Minion, MD;  Location: [facility];  Service: Orthopedics;  Laterality: Left;  . AMPUTATION Right 09/13/2012   Procedure: AMPUTATION BELOW KNEE;  Surgeon: Newt Minion, MD;  Location: [facility];  Service: Orthopedics;  Laterality: Right;  Right Below Knee Amputation  . AMPUTATION Left 12/12/2013   Procedure: Left Below Knee Amputation;  Surgeon: Newt Minion, MD;  Location: [facility];  Service: Orthopedics;  Laterality: Left;  . BELOW KNEE LEG AMPUTATION Left 12/12/2013   Archie Endo 12/12/2013  . ESOPHAGOGASTRODUODENOSCOPY (EGD) WITH PROPOFOL N/A 09/16/2014   Procedure: ESOPHAGOGASTRODUODENOSCOPY (EGD) WITH PROPOFOL;  Surgeon: Wonda Horner, MD;  Location[facility];  Service: Endoscopy;  Laterality: N/A;  . I&D EXTREMITY  08/24/2011   Procedure: IRRIGATION AND DEBRIDEMENT EXTREMITY;  Surgeon: Meredith Pel, MD;  Location: [facility];  Service: Orthopedics;  Laterality: Left;  . I&D EXTREMITY  02/18/2012   Procedure:  IRRIGATION AND DEBRIDEMENT EXTREMITY;  Surgeon: Mcarthur Rossetti, MD;  Location: [facility];  Service: Orthopedics;  Laterality: Left;  . I&D EXTREMITY Left 08/28/2012   Procedure: IRRIGATION AND DEBRIDEMENT EXTREMITY;  Surgeon: Linna Hoff, MD;  Location: [place];  Service: Orthopedics;  Laterality: Left;  CYSTO TUBING/IRRIGATION, LEAD HAND.  . I&D EXTREMITY Left 08/30/2012   Procedure: IRRIGATION AND DEBRIDEMENT EXTREMITY;  Surgeon: Linna Hoff, MD;  Location: [place];  Service: Orthopedics;  Laterality: Left;  . NEPHRECTOMY Left   . STUMP REVISION Left 01/23/2014   Procedure: STUMP REVISION;  Surgeon: Newt Minion, MD;  Location: [place] [facility];  Service: Orthopedics;  Laterality: Left;  . TOE AMPUTATION Left 06/2011   4th toe       Home Medications    Prior to Admission medications   Medication Sig Start Date End Date Taking? Authorizing Provider  ACCU-CHEK FASTCLIX LANCETS MISC Check blood sugar before meals and bedtime. Dx code: E11.8 10/22/15   Milagros Loll, MD  amLODipine (NORVASC) 10 MG tablet Take 1 tablet (10 mg total) by mouth daily. 08/26/15   Liberty Handy, MD  atorvastatin (LIPITOR) 40 MG tablet Take 1 tablet (40 mg total) by mouth  daily at 6 PM. 10/29/15   [name], MD  Blood Glucose Monitoring Suppl (ACCU-CHEK NANO SMARTVIEW) w/Device KIT Check blood sugar before meals and bedtime. Dx code: E11.8 10/22/15   Milagros Loll, MD  diclofenac sodium (VOLTAREN) 1 % GEL Apply 4 g topically 4 (four) times daily. 08/26/15   Liberty Handy, MD  ferrous sulfate 325 (65 FE) MG tablet Take 1 tablet (325 mg total) by mouth daily with breakfast. 10/28/14   Ejiroghene E Denton Brick, MD  FLUoxetine (PROZAC) 20 MG capsule Take 1 capsule (20 mg total) by mouth daily. 10/22/15   Milagros Loll, MD  gabapentin (NEURONTIN) 600 MG tablet Take 1 tablet (600 mg total) by mouth 3 (three) times daily. 02/01/15   [name], MD  glucose blood (ACCU-CHEK SMARTVIEW) test strip Check blood sugar before meals  and bedtime. Dx code: E11.8 10/22/15   Milagros Loll, MD  insulin glargine (LANTUS) 100 UNIT/ML injection Inject 0.4 mLs (40 Units total) into the skin at bedtime. 10/22/15 10/21/16  Milagros Loll, MD  Insulin Syringe-Needle U-100 (EASY COMFORT INSULIN SYRINGE) 31G X 5/16" 1 ML MISC Use to inject insulin daily 10/22/15   Milagros Loll, MD  lisinopril (PRINIVIL,ZESTRIL) 20 MG tablet Take 1 tablet (20 mg total) by mouth daily. 10/28/14   Ejiroghene Arlyce Dice, MD  metFORMIN (GLUCOPHAGE) 1000 MG tablet Take 1 tablet (1,000 mg total) by mouth 2 (two) times daily with a meal. 08/26/15 08/25/16  Liberty Handy, MD  metoCLOPramide (REGLAN) 10 MG tablet Take 1 tablet (10 mg total) by mouth 4 (four) times daily -  before meals and at bedtime. 10/22/15   Milagros Loll, MD  pantoprazole (PROTONIX) 40 MG tablet Take 1 tablet (40 mg total) by mouth 2 (two) times daily. 10/22/15   Milagros Loll, MD  polyethylene glycol [facility] Floria Raveling) packet Take 17 g by mouth daily. 10/22/15   Milagros Loll, MD  senna-docusate (SENOKOT-S) 8.6-50 MG tablet Take 1 tablet by mouth daily. 07/09/15   Tasrif Ahmed, MD  sildenafil (REVATIO) 20 MG tablet Take 2-5 tablets before sexual activity. 08/25/14   [name], MD  sucralfate (CARAFATE) 1 GM/10ML suspension Take 10 mLs (1 g total) by mouth 4 (four) times daily -  with meals and at bedtime. 10/27/14   [name], MD    Family History Family History  Problem Relation Age of Onset  . Diabetes type II Mother   . Pancreatic cancer Father     Social History Social History  Substance Use Topics  . Smoking status: Current Every Day Smoker    Packs/day: 0.50    Years: 38.00    Types: Cigarettes, Cigars  . Smokeless tobacco: Never Used  . Alcohol use 6.6 oz/week    11 Shots of liquor per week     Comment: 10/20/2015 "drink 1 pint/week"     Allergies   Review of patient's allergies indicates no known allergies.   Review of Systems Review of Systems A  complete 10 system review of systems was obtained and all systems are negative except as noted in the HPI and PMH.   Physical Exam Updated Vital Signs BP (!) 168/110 (BP Location: Right Arm)   Pulse 99   Temp 98 F (36.7 C) (Oral)   Resp 13   Wt 176 lb (79.8 kg)   SpO2 100%   BMI 26.76 kg/m   Physical Exam  Constitutional: He is oriented to person, place, and time. He appears well-developed and  well-nourished.  HENT:  Head: Normocephalic and atraumatic.  Eyes: EOM are normal.  Neck: Normal range of motion.  Cardiovascular: Normal rate, regular rhythm, normal heart sounds and intact distal pulses.   Pulmonary/Chest: Effort normal and breath sounds normal. No respiratory distress.  Abdominal: Soft. He exhibits no distension. There is tenderness. There is no rebound and no guarding.  Generalized abdominal pain with no rebound or guarding  Musculoskeletal: Normal range of motion.  Bilateral BKA in lower extremities  Neurological: He is alert and oriented to person, place, and time.  Skin: Skin is warm and dry.  Psychiatric: He has a normal mood and affect. Judgment normal.  Nursing note and vitals reviewed.    ED Treatments / Results  DIAGNOSTIC STUDIES: Oxygen Saturation is 100% on RA, normal by my interpretation.  COORDINATION OF CARE:  12:59 AM Discussed treatment plan which includes IV fluids and nausea medication with pt at bedside and pt agreed to plan.  Labs (all labs ordered are listed, but only abnormal results are displayed) Labs Reviewed  CBG MONITORING, ED - Abnormal; Notable for the following:       Result Value   Glucose-Capillary 430 (*)    All other components within normal limits  LIPASE, BLOOD  COMPREHENSIVE METABOLIC PANEL  CBC  URINALYSIS, ROUTINE W REFLEX MICROSCOPIC (NOT AT [facility])  TYPE AND SCREEN    EKG  EKG Interpretation  Date/Time:  Wednesday November 10 2015 00:25:36 EDT Ventricular Rate:  101 PR Interval:    QRS Duration: 98 QT  Interval:  379 QTC Calculation: 492 R Axis:   -58 Text Interpretation:  Sinus tachycardia Left anterior fascicular block Abnormal R-wave progression, early transition Borderline prolonged QT interval Confirmed by Dafney Farler  MD, Britteney Ayotte ([id number]) on 11/10/2015 12:29:09 AM       Radiology No results found.  Procedures Procedures (including critical care time)  Medications Ordered in ED Medications  sodium chloride 0.9 % bolus 1,000 mL (not administered)  ondansetron (ZOFRAN) injection 4 mg (not administered)  metoCLOPramide (REGLAN) injection 10 mg (not administered)  insulin aspart (novoLOG) injection 8 Units (not administered)  morphine 4 MG/ML injection 4 mg (not administered)     Initial Impression / Assessment and Plan / ED Course  I have reviewed the triage vital signs and the nursing notes.  Pertinent labs & imaging results that were available during my care of the patient were reviewed by me and considered in my medical decision making (see chart for details).  Clinical Course    Patient with history of gastroparesis and diabetes. He presents for elevation of elevated blood sugar, nausea, vomiting, and abdominal pain. He was given medications and fluids and appears somewhat more comfortable, however continues to complain of significant discomfort. His lipase is slightly elevated at 80. I've discussed this with the teaching service who will admit for hydration and pain control.  Final Clinical Impressions(s) / ED Diagnoses   Final diagnoses:  None   I personally performed the services described in this documentation, which was scribed in my presence. The recorded information has been reviewed and is accurate.     New Prescriptions New Prescriptions   No medications on file     Veryl Speak, MD 11/10/15 [phone]

## 2015-11-10 NOTE — Progress Notes (Signed)
Subjective: Pt continues to complain of diffuse abdominal pain and intermittent nausea but no more events of vomiting since coming to the floor. He states compliance with insulin and gastroparesis outpatient therapy. No other complaints at this time. Would like to try clear sips.  Objective:  Vital signs in last 24 hours: Vitals:   11/10/15 0600 11/10/15 0641 11/10/15 0643 11/10/15 1132  BP: 169/90 (!) 174/83 (!) 171/87 (!) 155/66  Pulse: 103 94 97 97  Resp: 22 15 16 20   Temp:  97.7 F (36.5 C)  97.6 F (36.4 C)  TempSrc:  Oral    SpO2: 100% 100% 100% 100%  Weight:  79.8 kg (176 lb)     Physical Exam  Constitutional: He appears well-developed. No distress.  Cardiovascular: Normal rate and regular rhythm.  Exam reveals no gallop and no friction rub.   No murmur heard. Pulmonary/Chest: Effort normal and breath sounds normal. No respiratory distress. He has no wheezes. He has no rales.  Abdominal: Soft. Bowel sounds are normal. He exhibits no distension. There is no tenderness.  Musculoskeletal:  Bilateral BKA  Skin: Skin is warm and dry. He is not diaphoretic.    Assessment/Plan:  Principal Problem:   Diabetic gastroparesis associated with type 2 diabetes mellitus ([place]) Active Problems:   Diabetes mellitus type 2, uncontrolled, with complications ([place])   Polysubstance abuse   Essential hypertension   CKD Stage 2   Hyperlipedemia   Anemia, iron deficiency   S/P bilateral BKA (below knee amputation) (H[facility])   Esophagitis (distal), erosive (09/2014)   Acute-on-chronic kidney injury ([place])  Nausea and vomiting secondary to Gastroparesis in setting of uncontrolled DM Type 2. Patient has a diagnosis of gastroparesis based on gastric emptying study in 2008 showing abnormally low gastric emptying (retention at 2hrs was 56%). From lab data available, pt has not had adequate control of his diabetes with the most recent Hgb A1C in May 2017 being 10.6. He was last admitted October 20, 2015  with [facility]. At this time patient reports use of metformin 1000mg  BID and Lantus 40U qhs with BG 438 on admission. Patient also reports taking his Reglan 10mg  QID.  --D5 1/2NS at 80ml/hr x 24hrs --clear sips diet as tolerated; if not tolerating continue IV fluid support --discontinued morphine PRN in setting of gastroparesis; if pain is not controlled with supportive care of gastroparesis, consider Tramadol 50mg  q4hr PRN --continue Reglan 10mg  QID; Zofran 4mg  inj q6hr PRN  Type II Diabetes Mellitus. Poorly controlled at home with questionable compliance with therapy (metformin 1000mg  BID and Lantus 40U qhs), presenting with BG of 438. Pt is s/p bilateral below the knee amputations for foot ulcers, abscesses and osteomyelitis in 2014 and 2015. --Hold Metformin --Lantus 40U qhs, Novolog SSI   Mild AKI, CKD Stage 2 s/p left nephrectomy in 2010. Baseline Cr 1.2-1.3; on admission Cr 1.5; BUN wnl.  --IVF support  --BMP in AM  Erosive esophagitis complicated with history of UGI bleed. Diagnosed on EGD 09/16/2014. Denies hematemesis and melena. Hgb 12.9.  --Continue home pantoprazole 40mg  BID and carafate 1g TID  HTN. Uncontrolled and asymptomatic. Patient on home regiment of Lisinopril 20mg  daily and Amlodipine 10mg  daily. Most recent BP 155/66, but not received daily meds yet. -- Continue Amlodipine 10mg  daily, hold lisinopril for now. -- Monitor BP  HLD. Patient on home regimen on atorvastatin 40mg . --Continue atrovastatin 40mg .  Polysubstance abuse. Patient admits use of cocaine and marijuana with no interest in cessation. Cocaine use may be exacerbating his  gastroparesis symptoms. --counsel patient on cessation.  Diet: Clear sips as tolerated IVF: D5 1/2NS at 68ml/hr Code: Full  Dispo: Anticipated discharge in approximately 1-2 day(s).   LOS: 0 days   Alphonzo Grieve, MD 11/10/2015, 11:52 AM Pager: [phone]

## 2015-11-10 NOTE — ED Triage Notes (Signed)
Pt in EMS from home, reports gen abd pain with N/V X2 days. Pt has hx gastric ulcers, recently admitted for similar reasons. Hypertensive.

## 2015-11-10 NOTE — Progress Notes (Signed)
Initial Nutrition Assessment  DOCUMENTATION CODES:   Obesity unspecified  INTERVENTION:   -RD will follow for diet advancement and supplement as appropriate  NUTRITION DIAGNOSIS:   Inadequate oral intake related to altered GI function as evidenced by per patient/family report, meal completion < 25%.  GOAL:   Patient will meet greater than or equal to 90% of their needs  MONITOR:   PO intake, Diet advancement, Labs, Skin, I & O's  REASON FOR ASSESSMENT:   Malnutrition Screening Tool    ASSESSMENT:   This is a 50 year old male who presents to ED via EMS for evaluation of abdominal pain. Patient has PMhx of alcoholic pancreatitis, uncontrolled DMII, gastroparesis, CKD stage 2, left nephrectomy due to pyelonephritis, HTN and substance abuse. The patient reports diffuse abdominal pain, worse in the epigastric region, that began yesterday morning.  Pt admitted with diabetic gastroparesis.   Spoke with pt, who was not very conversant at time of visit, mainly answered questions with 1-2 word answers and failed to elaborate further despite probing by RD. Pt reports poor oral intake over the past 1-2 days PTA, due to inability to keep foods and liquids down. Typically his appetite is fair. His diet was recently advanced to clears this morning and pt was sipping a gingerale without difficulty during visit.   Pt reveals UBW is around 210#. He suspects weight loss over the past month, due to eating poorly. However, this is not consistent with wt hx. Noted wt has ranged between 175-210# over the past year. Weight changes have not been significant for time frame. Suspect wt loss is related to uncontrolled DM.   Pt with hx of uncontrolled DM. Noted most recent Hgb A1c was 10.6. In 08/26/15. Home medication regimen is metformin 1000mg  BID and Lantus 40U qhs. Pt with known hx of polysubstance abuse; however, no tox screen results available for this admission (pt has previously tested positive for  cocaine and cannabis).   Nutrition-Focused physical exam completed. Findings are no fat depletion, no muscle depletion, and no edema.   Pt may benefit from review of DM education, particularly gastroparesis management, once he approaches discharge.   Labs reviewed: CBGS: 218-394. Lipase: 80.   Diet Order:  Diet clear liquid Room service appropriate? Yes; Fluid consistency: Thin  Skin:  Reviewed, no issues  Last BM:  11/09/15  Height:   Ht Readings from Last 1 Encounters:  10/20/15 5\' 8"  (1.727 m)    Weight:   Wt Readings from Last 1 Encounters:  11/10/15 176 lb (79.8 kg)    Ideal Body Weight:  60.9 kg (adjusted for bil BKA)  BMI:  Body mass index is 26.76 kg/m.   Adjusted BMI: 30.75 (for bilateral BKA)  Estimated Nutritional Needs:   Kcal:  1650-1850  Protein:  75-90 grams  Fluid:  1.6-1.8 L  EDUCATION NEEDS:   Education needs no appropriate at this time  Iza Preston A. Jimmye Norman, RD, LDN, CDE Pager: [phone] After hours Pager: [phone]

## 2015-11-10 NOTE — Care Management Note (Signed)
Case Management Note  Patient Details  Name: REUVEN HILDEBRAND MRN: [id number] Date of Birth: 1966-03-16  Subjective/Objective:                 Patient fro [facility] in [facility], lives with wife. States his wife drives him o appointmnets. He follows at [facility] clinic and gets his meds at [facility] in [facility]. He states he has blood glucose meter at home with supplies, and knows how to use it, he also states that he has his insulin at home. He states that he is not active with HH. He has two prosthesis for his B BKA, and does not have any needs for more DME at home. Admitted for gastroparesis 2/2 DM.   Action/Plan:  CM will continue to follow.   Expected Discharge Date:                  Expected Discharge Plan:  Home/Self Care  In-House Referral:     Discharge planning Services  CM Consult  Post Acute Care Choice:    Choice offered to:     DME Arranged:    DME Agency:     HH Arranged:    HH Agency:     Status of Service:  In process, will continue to follow  If discussed at Long Length of Stay Meetings, dates discussed:    Additional Comments:  Carles Collet, RN 11/10/2015, 11:51 AM

## 2015-11-10 NOTE — H&P (Signed)
Date: 11/10/2015               Patient Name:  [name] MRN: [id number]  DOB: 09-Jun-1965 Age / Sex: 50 y.o., male   PCP: [name], MD         Medical Service: Internal Medicine Teaching Service         Attending Physician: Dr. Veryl Speak, MD    First Contact: Dr. Alphonzo Grieve, MD Pager: [phone]  Second Contact: Dr. Dellia Nims Pager: [phone]       After Hours (After 5p/  First Contact Pager: [phone]  weekends / holidays): Second Contact Pager: [phone]   Chief Complaint: abdominal pain  History of Present Illness: This is a 50 year old male who presents to ED via EMS for evaluation of abdominal pain. Patient has PMhx of alcoholic pancreatitis, uncontrolled DMII, gastroparesis, CKD stage 2, left nephrectomy due to pyelonephritis, HTN and substance abuse. The patient reports diffuse abdominal pain, worse in the epigastric region, that began yesterday morning. He describes it as "achy", rated 9/10 in intensity and comes and goes. He denies any radiation to the back or shoulder as well as any exacerbating or remitting factors. He also complains of several episodes of nausea and vomiting. He reports he's had this several times before, with his last episode 2 weeks ago. He states he doesn't remember what he was told the problem was. He reports he has not been checking is blood sugar levels at home. He reports fevers and chills that come and go, abdominal pain, nausea and vomiting. He denies any hematemesis, diarrhea, constipation, chest pain, shortness of breath, cough, urinary symptoms.    Meds:   (Not in a hospital admission)   Allergies: Allergies as of 11/10/2015  . (No Known Allergies)   Past Medical History:  Diagnosis Date  . 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 Cellulitis and abscess of leg 01/31/2014  . Cellulitis of left foot 03/2008   left 4th and 5th metatarsal area  . CKD (chronic kidney disease) stage 2, GFR 60-89 ml/min    BL SCr 1.3-1.4  .  Depression   . Diabetic foot ulcer ([place])    s/p Right first ray amputation, left transmetatarsal amputation with revision  . Diabetic peripheral neuropathy ([place])   . Exposure to trichomonas    treated empirically  . GERD (gastroesophageal reflux disease)   . History of chronic pyelonephritis    secondary to left pyeloureteral junction obstruction.  Marland Kitchen History of drug abuse    Cocaine and marijuana  . History of prolonged Q-T interval on ECG   . Hyperlipidemia   . Hypertension   . Mallory [name] tear April 2009  . Onychomycosis   . Osteomyelitis of left leg ([place]) 01/21/2014  . Peripheral vascular disease ([place])   . Recurrent Foot Osteomyelitis 02/19/2012   Recurrent osteomyelitis of toes with multiple ray amputations Follows up with Dr Sharol Given   . Renal and perinephric abscess 09/2008   s/p left nephrectomy,massive Left pyonephrosis, s/p 2L pus drained via percutaneous   . Status post below knee amputation of right lower extremity ([place]) 03/23/2014  . Type 2 diabetes mellitus, uncontrolled, with renal complications ([place])   . Ulcerative esophagitis [id number]   severe, complicated with UGI bleed    Family History:  Father: pancreatic cancer Mother: diabetes type 2  Social History:  Tobacco: 1/2 pack per day x 38 years Alcohol: 1 beer daily Drug use: patient admits to cocaine and marijuana, last use  2 days ago  Review of Systems: A complete ROS was negative except as per HPI.  Physical Exam: Blood pressure 158/80, pulse 89, temperature 97.9 F (36.6 C), temperature source Oral, resp. rate 23, weight 79.8 kg (176 lb), SpO2 100 %. General: Alert, uncomfortable-appearing male seated in bed. Well developed and well nourished Head: normocephalic and atraumatic Cardiovascular: rate normal, rhythm regular.  Pulmonary: No respiratory distress. Effort and breath sounds normal Abdomen: Soft, without distension. Tenderness to palpation diffusely. No rebound or guarding.  Extremities: Without  edema. BL BKA of LE.  Skin: warm and dry   EKG:   EKG Interpretation  Date/Time:  Wednesday November 10 2015 00:25:36 EDT Ventricular Rate:  101 PR Interval:    QRS Duration: 98 QT Interval:  379 QTC Calculation: 492 R Axis:   -58 Text Interpretation:  Sinus tachycardia Left anterior fascicular block Abnormal R-wave progression, early transition Borderline prolonged QT interval Confirmed by Stark Jock  MD, DOUGLAS ([id number]) on 11/10/2015 12:29:09 AM       Assessment & Plan by Problem: Active Problems:   * No active hospital problems. * 1. Diabetic Gastroparesis: Patient has long history of poorly controled type 2 DM. Gastric emptying study from 2008 showed gastroparesis.  Patient's abdominal pain improved with Reglan.  Lipase 88 although doubt acute pancreatitis due secondary to gallstone. Patient had RUQ ultrasound done 10/21/15 which showed no cholelithiasis, nonspecific thickening of gallbladder wall and a small 3 mm gallbladder polyp. Patient does have history of alcoholic pancreatitis.  Patient NPO, IVF started (ns @100mL /hr), pain controlled with morphine, continued Reglan. Will continue to monitor.  2. Erosive esophagitis: Home Protonix 40mg  BID and carafate continued.  3. Type II Diabetes: Patient poorly controled at home. Blood glucose 438. 40 units Lantus ordered HS + moderate sliding scale. Will monitor.  4. AKI, CKD Stage 2:  Baseline SCr 1.2-1.3, Currently 1.5. IVF ordered. Will monitor.  5. HTN: Stable, asymptomatic Continuing home BP control (Lisinopril 20mg  daily, Amlodipine 10mg  daily)  6. HLD: stable Continuing atorvastatin 40mg    7. PVD, s/p BL knee amputation  8. Polysubstance abuse:  Pt admits to recent use of cocaine and marijuana as well as tobacco use. Patient not interested in cessation.   Dispo: Admit patient to Observation with expected length of stay less than 2 midnights.  SignedEinar Gip, DO 11/10/2015, 4:52 AM  Pager: [phone]

## 2015-11-10 NOTE — Progress Notes (Signed)
Inpatient Diabetes Program Recommendations  AACE/ADA: New Consensus Statement on Inpatient Glycemic Control (2015)  Target Ranges:  Prepandial:   less than 140 mg/dL      Peak postprandial:   less than 180 mg/dL (1-2 hours)      Critically ill patients:  140 - 180 mg/dL  Results for POWER, ARNET (MRN [id number]) as of 11/10/2015 09:50  Ref. Range 10/22/2015 07:37 10/22/2015 11:20 11/10/2015 00:57 11/10/2015 04:35 11/10/2015 08:11  Glucose-Capillary Latest Ref Range: 65 - 99 mg/dL 213 (H) 322 (H) 430 (H) 313 (H) 374 (H)     Review of Glycemic Control  Diabetes history: DM 2 Outpatient Diabetes medications: Lantus 40 units + Metformin 1 gm bid Current orders for Inpatient glycemic control: Lantus 40 units + Novolog correction 0-15 units q 4 hrs.  Inpatient Diabetes Program Recommendations:  Noted A1c 10.6 in May so please consider A1c to determine current prehospital glycemic control.  Thank you, Nani Gasser. Jaremy Nosal, RN, MSN, CDE Inpatient Glycemic Control Team Team Pager [phone] (8am-5pm) 11/10/2015 9:53 AM

## 2015-11-11 DIAGNOSIS — Z905 Acquired absence of kidney: Secondary | ICD-10-CM

## 2015-11-11 DIAGNOSIS — L97819 Non-pressure chronic ulcer of other part of right lower leg with unspecified severity: Secondary | ICD-10-CM

## 2015-11-11 DIAGNOSIS — L8993 Pressure ulcer of unspecified site, stage 3: Secondary | ICD-10-CM

## 2015-11-11 DIAGNOSIS — E1143 Type 2 diabetes mellitus with diabetic autonomic (poly)neuropathy: Secondary | ICD-10-CM | POA: Diagnosis not present

## 2015-11-11 DIAGNOSIS — K3184 Gastroparesis: Secondary | ICD-10-CM | POA: Diagnosis not present

## 2015-11-11 DIAGNOSIS — E1165 Type 2 diabetes mellitus with hyperglycemia: Secondary | ICD-10-CM | POA: Diagnosis not present

## 2015-11-11 DIAGNOSIS — E1122 Type 2 diabetes mellitus with diabetic chronic kidney disease: Secondary | ICD-10-CM | POA: Diagnosis not present

## 2015-11-11 LAB — CBC
HEMATOCRIT: 30.6 % — AB (ref 39.0–52.0)
HEMOGLOBIN: 9.6 g/dL — AB (ref 13.0–17.0)
MCH: 27.7 pg (ref 26.0–34.0)
MCHC: 31.4 g/dL (ref 30.0–36.0)
MCV: 88.2 fL (ref 78.0–100.0)
Platelets: 422 10*3/uL — ABNORMAL HIGH (ref 150–400)
RBC: 3.47 MIL/uL — AB (ref 4.22–5.81)
RDW: 14.7 % (ref 11.5–15.5)
WBC: 11.2 10*3/uL — ABNORMAL HIGH (ref 4.0–10.5)

## 2015-11-11 LAB — BASIC METABOLIC PANEL
ANION GAP: 6 (ref 5–15)
BUN: 10 mg/dL (ref 6–20)
CO2: 25 mmol/L (ref 22–32)
Calcium: 8.4 mg/dL — ABNORMAL LOW (ref 8.9–10.3)
Chloride: 105 mmol/L (ref 101–111)
Creatinine, Ser: 1.33 mg/dL — ABNORMAL HIGH (ref 0.61–1.24)
GLUCOSE: 107 mg/dL — AB (ref 65–99)
POTASSIUM: 3.9 mmol/L (ref 3.5–5.1)
Sodium: 136 mmol/L (ref 135–145)

## 2015-11-11 LAB — GLUCOSE, CAPILLARY
GLUCOSE-CAPILLARY: 68 mg/dL (ref 65–99)
GLUCOSE-CAPILLARY: 104 mg/dL — AB (ref 65–99)
GLUCOSE-CAPILLARY: 105 mg/dL — AB (ref 65–99)
GLUCOSE-CAPILLARY: 273 mg/dL — AB (ref 65–99)
GLUCOSE-CAPILLARY: 63 mg/dL — AB (ref 65–99)
Glucose-Capillary: 262 mg/dL — ABNORMAL HIGH (ref 65–99)
Glucose-Capillary: 345 mg/dL — ABNORMAL HIGH (ref 65–99)

## 2015-11-11 LAB — HEMOGLOBIN A1C
Hgb A1c MFr Bld: 13.7 % — ABNORMAL HIGH (ref 4.8–5.6)
MEAN PLASMA GLUCOSE: 346 mg/dL

## 2015-11-11 MED ORDER — LISINOPRIL 20 MG PO TABS
20.0000 mg | ORAL_TABLET | Freq: Every day | ORAL | Status: DC
  Administered 2015-11-11 – 2015-11-12 (×2): 20 mg via ORAL
  Filled 2015-11-11 (×2): qty 1

## 2015-11-11 MED ORDER — INSULIN GLARGINE 100 UNIT/ML ~~LOC~~ SOLN
40.0000 [IU] | Freq: Every day | SUBCUTANEOUS | Status: DC
  Filled 2015-11-11: qty 0.4

## 2015-11-11 MED ORDER — MUPIROCIN CALCIUM 2 % EX CREA
TOPICAL_CREAM | Freq: Every day | CUTANEOUS | Status: DC
  Administered 2015-11-11 – 2015-11-12 (×2): via TOPICAL
  Filled 2015-11-11: qty 15

## 2015-11-11 MED ORDER — CEPHALEXIN 500 MG PO CAPS
500.0000 mg | ORAL_CAPSULE | Freq: Four times a day (QID) | ORAL | Status: DC
  Administered 2015-11-11: 500 mg via ORAL
  Filled 2015-11-11: qty 1

## 2015-11-11 MED ORDER — INSULIN GLARGINE 100 UNIT/ML ~~LOC~~ SOLN
45.0000 [IU] | Freq: Every day | SUBCUTANEOUS | Status: DC
  Administered 2015-11-11: 45 [IU] via SUBCUTANEOUS
  Filled 2015-11-11 (×2): qty 0.45

## 2015-11-11 MED ORDER — GI COCKTAIL ~~LOC~~
30.0000 mL | Freq: Once | ORAL | Status: AC
  Administered 2015-11-11: 30 mL via ORAL
  Filled 2015-11-11: qty 30

## 2015-11-11 NOTE — Progress Notes (Signed)
Subjective: Patient states he feels a lot better today. He denies further nausea and vomiting and he has been tolerating clear sips well.  He states he feels ready to advance his diet to softs; claims at last admission he was advanced from liquids to full diet caused constipation at discharge. He states presence of new wound on his right BKA stump that is non tender but is draining a little bit; he has noticed a slight malodor to the drainage last night.   Objective:  Vital signs in last 24 hours: Vitals:   11/10/15 0643 11/10/15 1132 11/10/15 2100 11/11/15 0500  BP: (!) 171/87 (!) 155/66 (!) 135/93 115/64  Pulse: 97 97 (!) 103 100  Resp: 16 20 18 18   Temp:  97.6 F (36.4 C) 98.2 F (36.8 C) 97.8 F (36.6 C)  TempSrc:   Oral Oral  SpO2: 100% 100% 100% 100%  Weight:       Physical Exam  Constitutional: He appears well-developed. No distress.  Cardiovascular: Normal rate and regular rhythm.  Exam reveals no gallop and no friction rub.   No murmur heard. Pulmonary/Chest: Effort normal and breath sounds normal. No respiratory distress. He has no wheezes. He has no rales.  Abdominal: Soft. Bowel sounds are normal. He exhibits no distension. There is no tenderness.  Musculoskeletal: He exhibits no edema.  Bilateral BKA  Skin: Skin is warm and dry. He is not diaphoretic.  Right BKA stump: clean based ulcer measuring ~1.5cm in length with good granulation tissue and mild serous drainage; no erythema or edema, and non tender to touch    Assessment/Plan:  Principal Problem:   Diabetic gastroparesis associated with type 2 diabetes mellitus ([place]) Active Problems:   Diabetes mellitus type 2, uncontrolled, with complications ([place])   Polysubstance abuse   Essential hypertension   CKD Stage 2   Hyperlipedemia   Anemia, iron deficiency   S/P bilateral BKA (below knee amputation) ([place])   Esophagitis (distal), erosive (09/2014)   Acute-on-chronic kidney injury ([place])  Nausea and vomiting  secondary to Gastroparesis in setting of uncontrolled DM Type 2. Patient has a diagnosis of gastroparesis based on gastric emptying study in 2008 showing abnormally low gastric emptying (retention at 2hrs was 56%). From lab data available, pt has not had adequate control of his diabetes with the most recent Hgb A1C in May 2017 being 10.6. He was last admitted October 20, 2015 with [facility]. At this time patient reports use of metformin 1000mg  BID and Lantus 40U qhs with BG 438 on admission. Patient also reports taking his Reglan 10mg  QID.  --D/C IV fluids due to tolerance of PO diet --advance diet to soft, monitor tolerance --Tramadol 50mg  q4hr PRN fo pain --continue Reglan 10mg  QID; Zofran 4mg  inj q6hr PRN  Type II Diabetes Mellitus. Poorly controlled at home with questionable compliance with therapy (metformin 1000mg  BID and Lantus 40U qhs), presenting with BG of 438. Pt is s/p bilateral below the knee amputations for foot ulcers, abscesses and osteomyelitis in 2014 and 2015. Lantus was increased to 50u last night, which resulted in a BG of 68 (promptly corrected by orange juice). Last SBG of 105. --Hold Metformin --Lantus 45U qhs, Novolog SSI  Mild AKI, CKD Stage 2 s/p left nephrectomy in 2010. Baseline Cr 1.2-1.3; on admission Cr 1.5; BUN wnl.  --Cr 1.3 today, at baseline - Resolved --d/c IVF in setting of tolerating PO fluids and correction of Cr.  Ulcer on R BKA site. Patient states he has been using  his prosthesis more often recently, and site shows evidence of callous likely secondary to prosthesis use. Ulcer more likely caused by pressure. Ulcer is clean-based, nontender, without erythema, edema, of purulent drainage.  --Wound care consult while in hospital --Will f/u in [facility] for management of uncomplicated ulcer.  Erosive esophagitis complicated with history of UGI bleed. Diagnosed on EGD 09/16/2014. Denies hematemesis and melena. Hgb 12.9 on admission, 9.6 on repeat today. His baseline from  previous admissions seems to be 9-10, so more normalized Hgb on admission is likely due to volume contraction 2/2 vomiting.  --Continue home pantoprazole 40mg  BID and carafate 1g TID  HTN. Controlled and asymptomatic. Patient on home regiment of Lisinopril 20mg  daily and Amlodipine 10mg  daily. Most recent BP 115/64 -- Continue Amlodipine 10mg  daily, hold lisinopril for now. -- Monitor BP  HLD. Patient on home regimen on atorvastatin 40mg . --Continue atrovastatin 40mg .  Polysubstance abuse. Patient admits use of cocaine and marijuana with no interest in cessation. Cocaine use may be exacerbating his gastroparesis symptoms. --counsel patient on cessation.  Diet: Soft diet IVF: None Code: Full  Dispo: Anticipated discharge in approximately 1-2 day(s).   LOS: 0 days   Alphonzo Grieve, MD 11/11/2015, 7:22 AM Pager: [phone]

## 2015-11-11 NOTE — Consult Note (Addendum)
[facility] Nurse wound consult note Reason for Consult: Consult requested for right BKA stump.  Pt recently had a prosthesis fitted and states his stump was rubbing against it when it was hot and he had swelling at the location. Wound type: Full thickness to lower BKA stump Measurement: .6X.6X.2cm Wound bed: yellow and dry Drainage (amount, consistency, odor) no odor or drainage Periwound: surrounded by dry calloused wound edges Dressing procedure/placement/frequency: Bactroban to provide antimicrobial benefits and promote moist healing.  Pt states he will follow-up with getting his prosthesis liner refitted, and his ortho physician if the site declines. Discussed plan of care and he verbalized understanding.  Please re-consult if further assistance is needed.  Thank-you,  Julien Girt MSN, [place], [place], [place], [place]

## 2015-11-11 NOTE — Progress Notes (Addendum)
[id number] had CBG of 63 Pt is AAOX4 X4. Pt given snack and juice. Will continue to monitor 0000Pt recheck CBG 62. Pt is AAOX4. Pt given snack and juice 0020 pt CBG 132. Will continue to monitor pt.

## 2015-11-11 NOTE — Progress Notes (Signed)
Inpatient Diabetes Program Recommendations  AACE/ADA: New Consensus Statement on Inpatient Glycemic Control (2015)  Target Ranges:  Prepandial:   less than 140 mg/dL      Peak postprandial:   less than 180 mg/dL (1-2 hours)      Critically ill patients:  140 - 180 mg/dL   Results for [name], [name] (MRN [id number]) as of 11/11/2015 12:21  Ref. Range 11/10/2015 10:46  Hemoglobin A1C Latest Ref Range: 4.8 - 5.6 % 13.7 (H)    Inpatient Diabetes Program Recommendations:   Spoke with pt about A1C results and explained what an A1C is,  basic home care, basic diabetes diet nutrition principles, importance of checking CBGs and maintaining good CBG control to prevent long-term and short-term complications. Also reviewed blood sugar goals at home.  Patient states he has meter and strips @ home to do CBGs regularly. Shared that he currently has a Education officer, museum @ PCP office who has connected him with [employer] and other resources but finds it is still hard to have enough money for groceries for the month. Reviewed basic plate method with patient and ideas for inexpensive ways to eat some meals. Patient shared he and his wife have limited refrigerator space @ this time due to living @ a rooming house and shares a refrigerator. Encouraged patient to keep appts and wife to followup with PCP's office social worker.  Thank you, Nani Gasser. Taym Twist, RN, MSN, CDE Inpatient Glycemic Control Team Team Pager [phone] (8am-5pm) 11/11/2015 12:27 PM

## 2015-11-12 DIAGNOSIS — E1143 Type 2 diabetes mellitus with diabetic autonomic (poly)neuropathy: Secondary | ICD-10-CM

## 2015-11-12 DIAGNOSIS — K3184 Gastroparesis: Secondary | ICD-10-CM | POA: Diagnosis not present

## 2015-11-12 LAB — GLUCOSE, CAPILLARY
GLUCOSE-CAPILLARY: 132 mg/dL — AB (ref 65–99)
GLUCOSE-CAPILLARY: 171 mg/dL — AB (ref 65–99)
GLUCOSE-CAPILLARY: 357 mg/dL — AB (ref 65–99)
GLUCOSE-CAPILLARY: 62 mg/dL — AB (ref 65–99)
Glucose-Capillary: 309 mg/dL — ABNORMAL HIGH (ref 65–99)
Glucose-Capillary: 122 mg/dL — ABNORMAL HIGH (ref 65–99)

## 2015-11-12 MED ORDER — MUPIROCIN CALCIUM 2 % EX CREA: TOPICAL_CREAM | Freq: Every day | CUTANEOUS | 0 refills | Status: DC

## 2015-11-12 MED ORDER — INSULIN ASPART 100 UNIT/ML ~~LOC~~ SOLN
0.0000 [IU] | Freq: Three times a day (TID) | SUBCUTANEOUS | Status: DC
  Administered 2015-11-12: 3 [IU] via SUBCUTANEOUS

## 2015-11-12 MED ORDER — METFORMIN HCL ER 500 MG PO TB24: 1000.0000 mg | ORAL_TABLET | Freq: Every day | ORAL | 2 refills | Status: DC

## 2015-11-12 NOTE — Progress Notes (Signed)
Subjective: Patient continues to do well and is tolerating diet. Had a CBG of 62 last night corrected with juice and snack, repeat CBG 3hrs later was 357. Patient would only allow 11U Novolog to be given. AM CBG 122. Patient wishes to be discharged after lunch to ensure he is tolerating food still. No complaints today.  Objective:  Vital signs in last 24 hours: Vitals:   11/11/15 1524 11/11/15 2015 11/11/15 2045 11/12/15 0518  BP: (!) 149/78 (!) 112/54 138/70 138/67  Pulse: (!) 102  (!) 105 91  Resp: 19  16 16   Temp: 99.3 F (37.4 C)  98.1 F (36.7 C) 99 F (37.2 C)  TempSrc:      SpO2: 100%  99% 100%  Weight:       Physical Exam  Constitutional: He appears well-developed. No distress.  Cardiovascular: Normal rate and regular rhythm.  Exam reveals no gallop and no friction rub.   No murmur heard. Pulmonary/Chest: Effort normal and breath sounds normal. No respiratory distress. He has no wheezes. He has no rales.  Abdominal: Soft. Bowel sounds are normal. He exhibits no distension. There is no tenderness.  Musculoskeletal: He exhibits no edema.  Bilateral BKA  Skin: Skin is warm and dry. He is not diaphoretic.  Right BKA stump: clean based ulcer measuring ~1.5cm in length with good granulation tissue and mild serous drainage; no erythema or edema, and non tender to touch    Assessment/Plan:  Principal Problem:   Diabetic gastroparesis associated with type 2 diabetes mellitus ([place]) Active Problems:   Diabetes mellitus type 2, uncontrolled, with complications ([place])   Polysubstance abuse   Essential hypertension   CKD Stage 2   Hyperlipedemia   Anemia, iron deficiency   S/P bilateral BKA (below knee amputation) ([facility])   Esophagitis (distal), erosive (09/2014)   Pressure ulcer stage III ([facility])  Nausea and vomiting secondary to Gastroparesis in setting of uncontrolled DM Type 2. Patient has a diagnosis of gastroparesis based on gastric emptying study in 2008 showing abnormally  low gastric emptying (retention at 2hrs was 56%). From lab data available, pt has not had adequate control of his diabetes with the most recent Hgb A1C in May 2017 being 10.6. He was last admitted October 20, 2015 with [facility]. At this time patient reports use of metformin 1000mg  BID and Lantus 40U qhs with BG 438 on admission. Patient also reports taking his Reglan 10mg  QID.  --advance diet as tolerated --Tramadol 50mg  q4hr PRN for pain --continue Reglan 10mg  QID; Zofran 4mg  inj q6hr PRN  Type II Diabetes Mellitus. Poorly controlled at home with questionable compliance with therapy (metformin 1000mg  BID and Lantus 40U qhs), presenting with BG of 438. Pt is s/p bilateral below the knee amputations for foot ulcers, abscesses and osteomyelitis in 2014 and 2015. Lantus was adjusted to 45U at night. Patient had a BG low of 62 at 11pm which was corrected by juice and snack. F/u CBG was 357 at 3:30AM. Patient would only agree to take 11U Novolog. CBG this morning was 122. Pharmacy resident suggested sending him home with XR Metformin since patient only takes one meal and thus one dose of Metformin daily now. Diabetes Manager urged suggesting to patient to add meal coverage with Novolog at home, but patient is resistant still due to having single meal, transient home status, and wants to limit needle sticks. DM suggested glimeperide or glipizide in addition to Lantus to be addressed at outpt f/u visit.  --Hold Metformin --Lantus 40U  qhs, Novolog SSI with meals --D/C planning, will continue Lantus 40U qhs; Metformin XR 100mg  PO with dinner  Mild AKI, CKD Stage 2 s/p left nephrectomy in 2010. Baseline Cr 1.2-1.3; on admission Cr 1.5; BUN wnl.  --Resolved  Ulcer on R BKA site. Patient states he has been using his prosthesis more often recently, and site shows evidence of callous likely secondary to prosthesis use. Ulcer more likely caused by pressure. Ulcer is clean-based, nontender, without erythema, edema, of purulent  drainage.  --Wound care consult: Full thickness clean ulcer on BKA. Patient will f/u with prosthesis refitting. --Will f/u in [facility] for management of uncomplicated ulcer and referral for prosthesis refitting. -- Bactroban to be continued on d/c.  Erosive esophagitis complicated with history of UGI bleed. Diagnosed on EGD 09/16/2014. Denies hematemesis and melena. Hgb 12.9 on admission, 9.6 on repeat. His baseline from previous admissions seems to be 9-10, so more normalized Hgb on admission is likely due to volume contraction 2/2 vomiting.  --Continue home pantoprazole 40mg  BID and carafate 1g TID  HTN. Controlled and asymptomatic. Patient on home regiment of Lisinopril 20mg  daily and Amlodipine 10mg  daily. We had been holding Lisinopril, but restarted last night. Most recent BP 138/67. -- Stable -- Continue Amlodipine 10mg  daily and Lisinopril 20mg  daily.   HLD. Patient on home regimen of atorvastatin 40mg . --Continue atrovastatin 40mg .  Polysubstance abuse. Patient admits use of cocaine and marijuana with no interest in cessation. Cocaine use may be exacerbating his gastroparesis symptoms. --counseled patient on cessation.  Diet: Soft diet IVF: None Code: Full  Dispo: Anticipated discharge in approximately 1-2 day(s).   LOS: 0 days   Wesley Grieve, MD 11/12/2015, 10:46 AM Pager: [phone]

## 2015-11-12 NOTE — Progress Notes (Addendum)
Inpatient Diabetes Program Recommendations  AACE/ADA: New Consensus Statement on Inpatient Glycemic Control (2015)  Target Ranges:  Prepandial:   less than 140 mg/dL      Peak postprandial:   less than 180 mg/dL (1-2 hours)      Critically ill patients:  140 - 180 mg/dL   Results for STACE, CLOHESSY (MRN [id number]) as of 11/12/2015 09:51  Ref. Range 11/11/2015 05:04 11/11/2015 07:33 11/11/2015 12:12 11/11/2015 17:50 11/11/2015 20:40 11/11/2015 23:23 11/11/2015 23:57 11/12/2015 00:24 11/12/2015 02:56 11/12/2015 03:37 11/12/2015 07:45  Glucose-Capillary Latest Ref Range: 65 - 99 mg/dL 104 (H) 105 (H) 273 (H) 262 (H) 345 (H) 63 (L) 62 (L) 132 (H) 309 (H) 357 (H) 122 (H)    Review of Glycemic Control  Diabetes history: DM2 Outpatient Diabetes medications: Lantus 40 units + Metformin 1 gm bid Current orders for Inpatient glycemic control: Lantus 45 units + Novolog correction 0-15 units q 4 hrs.  Inpatient Diabetes Program Recommendations:  Noted patient on Novolog correction q 4 hrs although currently eating. Please consider changing Novolog correction 0-15 units to ac tid & 0-5 units hs. Also consider adding meal coverage 4-5 units tid to assist with postprandial CBGs.Paged Dr. Jari Favre and discussed recommendations.  Thank you, Nani Gasser. Audriana Aldama, RN, MSN, CDE Inpatient Glycemic Control Team Team Pager [phone] (8am-5pm) 11/12/2015 9:54 AM

## 2015-11-12 NOTE — Progress Notes (Signed)
CSW received consult regarding food resources. Patient, patient's wife, and dog are at bedside. CSW provided resources, though patient stated they had already received the same resources at a previous admission. Patient and his wife stated that they have $200 in food stamps and for this reason cannot get vouchers for places offering free food. Patient would like to be added back to his wife's food stamps (he removed himself after they had an "altercation").They both get canned goods from [employer] but they "do not like canned goods and want something more". Patient also stated that he gets food boxes from the clinic downstairs. Patient reports that he does not get enough food to eat and that is why his blood sugar is always off.  CSW signing off.   Percell Locus Karole Oo LCSWA [phone]

## 2015-11-12 NOTE — Progress Notes (Signed)
CBG 357 at 0337, pt refused to take 15 units as ordered, stating 15 units will drop blood sugar low. MD paged and notified that pt is willing to take 10 units of insulin instead. MD approved 10 units at this time.

## 2015-11-12 NOTE — Discharge Summary (Signed)
Name: [name] MRN: [id number] DOB: Oct 24, 1965 50 y.o. PCP: [name], MD  Date of Admission: 11/10/2015 12:20 AM Date of Discharge: 11/12/2015 Attending Physician: Dr. [name]  Discharge Diagnosis: 1. Nausea and vomiting secondary to gastroparesis associated with type 2 diabetes mellitus. Principal Problem:   Diabetic gastroparesis associated with type 2 diabetes mellitus ([place]) Active Problems:   Diabetes mellitus type 2, uncontrolled, with complications ([place])   Polysubstance abuse   Essential hypertension   CKD Stage 2   Hyperlipedemia   Anemia, iron deficiency   S/P bilateral BKA (below knee amputation) (HCC)   Esophagitis (distal), erosive (09/2014)   Pressure ulcer stage III ([place])   Discharge Medications:   Medication List    STOP taking these medications   metFORMIN 1000 MG tablet Commonly known as:  GLUCOPHAGE Replaced by:  metFORMIN 500 MG 24 hr tablet     TAKE these medications   ACCU-CHEK FASTCLIX LANCETS Misc Check blood sugar before meals and bedtime. Dx code: E11.8   ACCU-CHEK NANO SMARTVIEW w/Device Kit Check blood sugar before meals and bedtime. Dx code: E11.8   amLODipine 10 MG tablet Commonly known as:  NORVASC Take 1 tablet (10 mg total) by mouth daily.   atorvastatin 40 MG tablet Commonly known as:  LIPITOR Take 1 tablet (40 mg total) by mouth daily at 6 PM.   diclofenac sodium 1 % Gel Commonly known as:  VOLTAREN Apply 4 g topically 4 (four) times daily.   ferrous sulfate 325 (65 FE) MG tablet Take 1 tablet (325 mg total) by mouth daily with breakfast.   FLUoxetine 20 MG capsule Commonly known as:  PROZAC Take 1 capsule (20 mg total) by mouth daily.   gabapentin 600 MG tablet Commonly known as:  NEURONTIN Take 1 tablet (600 mg total) by mouth 3 (three) times daily.   glucose blood test strip Commonly known as:  ACCU-CHEK SMARTVIEW Check blood sugar before meals and bedtime. Dx code: E11.8   insulin glargine 100  UNIT/ML injection Commonly known as:  LANTUS Inject 0.4 mLs (40 Units total) into the skin at bedtime.   Insulin Syringe-Needle U-100 31G X 5/16" 1 ML Misc Commonly known as:  EASY COMFORT INSULIN SYRINGE Use to inject insulin daily   lisinopril 20 MG tablet Commonly known as:  PRINIVIL,ZESTRIL Take 1 tablet (20 mg total) by mouth daily.   metFORMIN 500 MG 24 hr tablet Commonly known as:  GLUCOPHAGE XR Take 2 tablets (1,000 mg total) by mouth daily with breakfast. Replaces:  metFORMIN 1000 MG tablet   metoCLOPramide 10 MG tablet Commonly known as:  REGLAN Take 1 tablet (10 mg total) by mouth 4 (four) times daily -  before meals and at bedtime.   mupirocin cream 2 % Commonly known as:  BACTROBAN Apply topically daily.   pantoprazole 40 MG tablet Commonly known as:  PROTONIX Take 1 tablet (40 mg total) by mouth 2 (two) times daily.   polyethylene glycol packet Commonly known as:  MIRALAX / GLYCOLAX Take 17 g by mouth daily.   senna-docusate 8.6-50 MG tablet Commonly known as:  Senokot-S Take 1 tablet by mouth daily.   sildenafil 20 MG tablet Commonly known as:  REVATIO Take 2-5 tablets before sexual activity.   sucralfate 1 GM/10ML suspension Commonly known as:  CARAFATE Take 10 mLs (1 g total) by mouth 4 (four) times daily -  with meals and at bedtime.       Disposition and follow-up:   WesleyNishant B [name]  was discharged from [facility] in Stable condition.  At the hospital follow up visit please address:  1.   -Diabetes management. Patient switched to XR Metformin 107m-titrate slowly up to 10041mBID; on Lantus 40U qhs - consider adding oral agent as pt does not have regular meals. Hgb A1C 13.7 on 11/10/2015 -Stage 3 pressure ulcer on R BKA stump; close f/u for proper healing and infection control as pt has had a difficult history    with wound infections. -Prosthesis refitting: pressure ulcer possibly as result of improperly fitted prosthesis in  light of more frequent use of prosthesis.  -Continue to encourage cessation of cocaine, marijuana, and tobacco.     2.  Labs / imaging needed at time of follow-up: One year f/u RUQ U/S for monitoring of 24m27mallbladder polyp found incidentally on U/S 10/21/15.  3.  Pending labs/ test needing follow-up: None  Follow-up Appointments: Follow-up Information    SarBurgess EstelleD. Go on 11/15/2015.   Specialty:  Internal Medicine Why:  Your appointment is at 3:15 on Monday, Aug 7th. Contact information: [phone]        Hospital Course by problem list: Principal Problem:   Diabetic gastroparesis associated with type 2 diabetes mellitus ([place] Problems:   Diabetes mellitus type 2, uncontrolled, with complications [place] Polysubstance abuse   Essential hypertension   CKD Stage 2   Hyperlipedemia   Anemia, iron deficiency   S/P bilateral BKA (below knee amputation) (HCC)   Esophagitis (distal), erosive (09/2014)   Pressure ulcer stage III ([name] is a 50 67ar old male with past medical history of alcoholic pancreatitis, erosive esophagitis, uncontrolled DM2, gastroparesis, CKG stage II, left nephrectomy, hypertension, hyperlipidemia, depression, and substance-abuse who was admitted to the internal medicine teaching service for evaluation and management of problems.  1. Nausea and Vomiting secondary to gastroparesis in setting of uncontrolled Type 2 Diabetes Mellitus. Patient presented to the ED via EMS with one day history of worsening epigastric abdominal pain, nausea, and vomiting. He denied hematemesis, melena, diarrhea, constipation, chest pain, or shortness of breath. Exam was positive for tenderness to palpation diffusely and negative for rebound and guarding; his abdomen was soft and he had good bowel sounds. Patient's abdominal pain improved with Reglan. Lipase was lightly elevated at 88, giving low suspicion for acute  pancreatitis. RUQ U/S on 10/21/15 showed no cholelithiasis, nonspecific thickening of gallbladder wall and a small 24mm1mllbladder polyp. Patient has a diagnosis of gastroparesis based on gastric emptying study in 2008 showing abnormally low gastric emptying (retention at 2hrs was 56%). From lab data available, pt has not had adequate control of his diabetes with the most recent Hgb A1C in May 2017 being 10.6. He was last admitted October 20, 2015 with [facility]. At this time patient reports use of metformin 1000mg92m and Lantus 40U qhs with BG 438 on admission. Patient also reports taking his Reglan 10mg 34m Patient improved with supportive care with IVF, NPO with gradual advancement of diet, and avoidance of opioids for pain control; he was continued on Reglan QID. On discharge she was tolerating regular diet.  2. Type 2 Diabetes Mellitus. Patient on 40U Lantus qhs at home with Metformin 1000mg B524monly taking once a day due to only eating one meal a day). Patient admits to not taking BG at home. While admitted, patient was kept on Lantus 40U qhs with moderate SSI. He had two  episodes of hypoglycemia in the 60's at night when Lantus was increased to 50U and 45U due to poor daytime control. He was discharged with Metformin XR 1026m once a day (to be titrated up; patient advised to spread out smaller meals throughout day) and Lantus 40U qhs.   3. AKI, CKD Stage 2 S/P left nephrectomy in 2010. Patient had creatinine of 1.5 on admission (baseline 1.2-1.3), likely due to dehydration from vomiting and low PO intake. He was placed on IVF and creatinine improved to 1.33 at discharge.  4. Erosive esophagitis complicated by history of UGI bleed. Patient was diagnose with distal erosive esophagitis 09/2014 after an episode of hematemesis. Current admission he denies hematemesis and melena. Hemoglobin of 12.9 on admission 9.6 on repeat; his baseline from previous admission seems to be 9-10, some more normalized hemoglobin on  admission cyclically to volume contraction secondary to vomiting. He was continued on his home regimen of pantoprazole 446mBID, and carafate with meals.   5. Pressure Ulcer Stage 3. On admission, patient had a 1.5cm diameter clean-based, full thickness ulcer on his R BKA stump. He admits to using his prostheses more frequently in the recent past. Patient was concerned about infection as he has had a difficult past with multiple ulcers, abscesses and wound infections resulting in his current state of bilateral BKA. Patient complained of malodorous discharge without pain; on exam ulcer had a clean base, good granulation tissue, mild serous drainage, no surrounding erythema. Wound care was consulted and he was provided with Bactroban ointment. Patient was discharged with Bactroban ointment, advised on care of wound, and will be followed closely by the Internal Medicine Clinic for management of uncomplicated ulcer and referral for prosthesis refitting..  6. HTN. Patient has been on home regimen of Lisinopril 2039maily and amlodipine 32m32mily. In setting of AKI, we held his Lisinopril, and added it back before discharge for increasing hypertension upon resolution of AKI.   7. HLD. Patient was continued on home regimen of atorvastatin 40mg34m. Polysubstance abuse. Patient admits to recent use of cocaine and marijuana and tobacco use. Patient was not interested in cessation. He was counseled on risks of continued use, and benefits of cessation.   Discharge Vitals:   BP (!) 159/80 (BP Location: Left Arm)   Pulse 98   Temp 98.7 F (37.1 C) (Oral)   Resp 19   Wt 79.8 kg (176 lb)   SpO2 100%   BMI 26.76 kg/m   Pertinent Labs, Studies, and Procedures:  Results for Pickler,AUNDRAY, CARTLIDGE [id number] 11/12/2015 18:00  Ref. Range 11/11/2015 04:59  Sodium Latest Ref Range: 135 - 145 mmol/L 136  Potassium Latest Ref Range: 3.5 - 5.1 mmol/L 3.9  Chloride Latest Ref Range: 101 - 111 mmol/L 105  CO2 Latest  Ref Range: 22 - 32 mmol/L 25  BUN Latest Ref Range: 6 - 20 mg/dL 10  Creatinine Latest Ref Range: 0.61 - 1.24 mg/dL 1.33 (H)  Calcium Latest Ref Range: 8.9 - 10.3 mg/dL 8.4 (L)  EGFR (Non-African Amer.) Latest Ref Range: >60 mL/min >60  EGFR (African American) Latest Ref Range: >60 mL/min >60  Glucose Latest Ref Range: 65 - 99 mg/dL 107 (H)  Anion gap Latest Ref Range: 5 - 15  6    Ref. Range 11/10/2015 00:41  Alkaline Phosphatase Latest Ref Range: 38 - 126 U/L 129 (H)  Albumin Latest Ref Range: 3.5 - 5.0 g/dL 4.2  Lipase Latest Ref Range: 11 - 51  U/L 80 (H)  AST Latest Ref Range: 15 - 41 U/L 15  ALT Latest Ref Range: 17 - 63 U/L 15 (L)  Total Protein Latest Ref Range: 6.5 - 8.1 g/dL 8.9 (H)  Total Bilirubin Latest Ref Range: 0.3 - 1.2 mg/dL 0.4    Ref. Range 11/11/2015 04:59  WBC Latest Ref Range: 4.0 - 10.5 K/uL 11.2 (H)  RBC Latest Ref Range: 4.22 - 5.81 MIL/uL 3.47 (L)  Hemoglobin Latest Ref Range: 13.0 - 17.0 g/dL 9.6 (L)  HCT Latest Ref Range: 39.0 - 52.0 % 30.6 (L)  MCV Latest Ref Range: 78.0 - 100.0 fL 88.2  MCH Latest Ref Range: 26.0 - 34.0 pg 27.7  MCHC Latest Ref Range: 30.0 - 36.0 g/dL 31.4  RDW Latest Ref Range: 11.5 - 15.5 % 14.7  Platelets Latest Ref Range: 150 - 400 K/uL 422 (H)    Ref. Range 11/10/2015 10:46  Hemoglobin A1C Latest Ref Range: 4.8 - 5.6 % 13.7 (H)    Ref. Range 11/10/2015 02:01  Appearance Latest Ref Range: CLEAR  CLEAR  Bacteria, UA Latest Ref Range: NONE SEEN  RARE (A)  Bilirubin Urine Latest Ref Range: NEGATIVE  NEGATIVE  Casts Latest Ref Range: NEGATIVE  HYALINE CASTS (A)  Color, Urine Latest Ref Range: YELLOW  YELLOW  Glucose Latest Ref Range: NEGATIVE mg/dL >1000 (A)  Hgb urine dipstick Latest Ref Range: NEGATIVE  NEGATIVE  Ketones, ur Latest Ref Range: NEGATIVE mg/dL 15 (A)  Leukocytes, UA Latest Ref Range: NEGATIVE  NEGATIVE  Nitrite Latest Ref Range: NEGATIVE  NEGATIVE  pH Latest Ref Range: 5.0 - 8.0  5.5  Protein Latest Ref Range: NEGATIVE  mg/dL 30 (A)  RBC / HPF Latest Ref Range: 0 - 5 RBC/hpf 0-5  Specific Gravity, Urine Latest Ref Range: 1.005 - 1.030  1.040 (H)  Squamous Epithelial / LPF Latest Ref Range: NONE SEEN  0-5 (A)  WBC, UA Latest Ref Range: 0 - 5 WBC/hpf 0-5    RUQ U/S 10/21/2015: Nonspecific thickening of the gallbladder wall. Small gallbladder polyp.  No cholelithiasis.   Discharge Instructions: Discharge Instructions    Diet - low sodium heart healthy    Complete by:  As directed   Discharge instructions    Complete by:  As directed   For your gastroparesis, you will continue to take your Reglan four times a day. Please spread your meals out, start with smaller meals, and chew your food thoroughly. Take your Carafate with each meal, and your pantoprazole before breakfast and before dinner to reduce acid.  You will continue to take 40 units of Lantus at night. Please check your blood sugar 2-3 times a day.  We switched your metformin to a slower release pill. You will take 2 pills (1039m) with a meal every day. We will work on increasing this dose slowly with Dr. STiburcio Peain the clinic.  For your wound, continue to take good care of it, keep it clean, and apply the cream wound care gave you to it.   You will see Dr. STiburcio Peain the Internal Medicine Clinic on Monday August 7 at 3:15. Please bring a your blood sugar readings to your appointment.   Please pick up your prescriptions today.   Increase activity slowly    Complete by:  As directed      Signed: GAlphonzo Grieve MD 11/12/2015, 5:46 PM   Pager: [phone]

## 2015-11-15 ENCOUNTER — Encounter: Payer: Medicaid Other | Admitting: Internal Medicine

## 2015-11-16 ENCOUNTER — Telehealth: Payer: Self-pay | Admitting: Licensed Clinical Social Worker

## 2015-11-16 NOTE — Telephone Encounter (Signed)
CSW discussed with front office/pre-certs.  [employer] referral completed and awaiting continue processing.  Pt will be notified 3-6 months process.

## 2015-11-16 NOTE — Telephone Encounter (Signed)
CSW received voice mail from Mr. Louison.  Pt inquiring about the status of a Counsellor.  This worker is unaware regarding the DME referral and will forward.  Mr. Standage states he has returned to live with his spouse, Yong Channel and would like a letter indicating that so that they can continue to receive food stamps.  Pt is still looking for housing - pt has applied through [facility] and CSW provided listing to Mr. Spano.  CSW will need to confirm pt's address regarding letter for food stamps.

## 2015-11-16 NOTE — Telephone Encounter (Signed)
Called and spoke with Andria Rhein from [facility].  Our office has done everything.  The next step is a Home Assessment (if it has not already be completed) and she can submit it to his Insurance.  New Phone number has been given to [employer].  [facility] will call patient and get him updated on where they are with processing his PWC.

## 2015-11-17 ENCOUNTER — Encounter: Payer: Self-pay | Admitting: Licensed Clinical Social Worker

## 2015-11-17 NOTE — Telephone Encounter (Signed)
CSW returned call to [name].  Spoke with pt and spouse.  [facility] completed home assessment on 11/16/15.  Pt informed PWC process is generally 3-6 months.  CSW discussed needs for requested letter.  CSW provided letter and is awaiting pt pick up.

## 2015-12-23 ENCOUNTER — Other Ambulatory Visit: Payer: Self-pay | Admitting: Internal Medicine

## 2015-12-23 ENCOUNTER — Telehealth: Payer: Self-pay | Admitting: *Deleted

## 2015-12-23 MED ORDER — FLUOXETINE HCL 20 MG PO CAPS: 20.0000 mg | ORAL_CAPSULE | Freq: Every day | ORAL | 3 refills | Status: DC

## 2015-12-23 NOTE — Telephone Encounter (Signed)
Call from [place], [facility] for [facility] - states PHQ9 score 14 (depression screening); offered counseling, pt refused. Call to let his doctor to be aware. Thanks

## 2015-12-23 NOTE — Telephone Encounter (Signed)
FLUoxetine (PROZAC) 20 MG capsule [facility]

## 2015-12-24 ENCOUNTER — Other Ambulatory Visit: Payer: Self-pay | Admitting: Internal Medicine

## 2015-12-31 ENCOUNTER — Other Ambulatory Visit: Payer: Self-pay | Admitting: *Deleted

## 2015-12-31 NOTE — Telephone Encounter (Signed)
Received fax from pt's pharmacy with the following message "This prescription was filled today (12/31/2015).  Any refills authorized will be placed on file." Will send to pcp for review and additional refills if appropriate.  Please advise. Regenia Skeeter, Darlene Cassady9/22/20172:58 PM

## 2016-01-02 ENCOUNTER — Emergency Department (HOSPITAL_COMMUNITY)
Admission: EM | Admit: 2016-01-02 | Discharge: 2016-01-03 | Disposition: A | Discharge status: Home / Self Care | Payer: Medicaid Other | Attending: Emergency Medicine | Admitting: Emergency Medicine

## 2016-01-02 ENCOUNTER — Encounter (HOSPITAL_COMMUNITY): Payer: Self-pay | Admitting: Emergency Medicine

## 2016-01-02 ENCOUNTER — Emergency Department (HOSPITAL_COMMUNITY): Payer: Medicaid Other

## 2016-01-02 DIAGNOSIS — Z794 Long term (current) use of insulin: Secondary | ICD-10-CM | POA: Diagnosis not present

## 2016-01-02 DIAGNOSIS — R112 Nausea with vomiting, unspecified: Secondary | ICD-10-CM

## 2016-01-02 DIAGNOSIS — K209 Esophagitis, unspecified without bleeding: Secondary | ICD-10-CM

## 2016-01-02 DIAGNOSIS — I129 Hypertensive chronic kidney disease with stage 1 through stage 4 chronic kidney disease, or unspecified chronic kidney disease: Secondary | ICD-10-CM | POA: Diagnosis not present

## 2016-01-02 DIAGNOSIS — E1140 Type 2 diabetes mellitus with diabetic neuropathy, unspecified: Secondary | ICD-10-CM | POA: Insufficient documentation

## 2016-01-02 DIAGNOSIS — R1013 Epigastric pain: Secondary | ICD-10-CM | POA: Diagnosis present

## 2016-01-02 DIAGNOSIS — N182 Chronic kidney disease, stage 2 (mild): Secondary | ICD-10-CM | POA: Diagnosis not present

## 2016-01-02 DIAGNOSIS — F17210 Nicotine dependence, cigarettes, uncomplicated: Secondary | ICD-10-CM | POA: Diagnosis not present

## 2016-01-02 DIAGNOSIS — Z7984 Long term (current) use of oral hypoglycemic drugs: Secondary | ICD-10-CM | POA: Diagnosis not present

## 2016-01-02 LAB — CBC WITH DIFFERENTIAL/PLATELET
Basophils Absolute: 0 10*3/uL (ref 0.0–0.1)
Basophils Relative: 0 %
Eosinophils Absolute: 0 10*3/uL (ref 0.0–0.7)
Eosinophils Relative: 0 %
HCT: 35.2 % — ABNORMAL LOW (ref 39.0–52.0)
Hemoglobin: 11.6 g/dL — ABNORMAL LOW (ref 13.0–17.0)
Lymphocytes Relative: 6 %
Lymphs Abs: 0.9 10*3/uL (ref 0.7–4.0)
MCH: 29.1 pg (ref 26.0–34.0)
MCHC: 33 g/dL (ref 30.0–36.0)
MCV: 88.2 fL (ref 78.0–100.0)
Monocytes Absolute: 0.3 10*3/uL (ref 0.1–1.0)
Monocytes Relative: 2 %
Neutro Abs: 12.6 10*3/uL — ABNORMAL HIGH (ref 1.7–7.7)
Neutrophils Relative %: 92 %
Platelets: 497 10*3/uL — ABNORMAL HIGH (ref 150–400)
RBC: 3.99 MIL/uL — ABNORMAL LOW (ref 4.22–5.81)
RDW: 14.5 % (ref 11.5–15.5)
WBC: 13.7 10*3/uL — ABNORMAL HIGH (ref 4.0–10.5)

## 2016-01-02 LAB — COMPREHENSIVE METABOLIC PANEL
ALT: 22 U/L (ref 17–63)
AST: 24 U/L (ref 15–41)
Albumin: 4.5 g/dL (ref 3.5–5.0)
Alkaline Phosphatase: 104 U/L (ref 38–126)
Anion gap: 18 — ABNORMAL HIGH (ref 5–15)
BUN: 20 mg/dL (ref 6–20)
CO2: 25 mmol/L (ref 22–32)
Calcium: 10.6 mg/dL — ABNORMAL HIGH (ref 8.9–10.3)
Chloride: 97 mmol/L — ABNORMAL LOW (ref 101–111)
Creatinine, Ser: 1.37 mg/dL — ABNORMAL HIGH (ref 0.61–1.24)
GFR calc Af Amer: >60 mL/min (ref >60)
GFR calc non Af Amer: 59 mL/min — ABNORMAL LOW (ref >60)
Glucose, Bld: 330 mg/dL — ABNORMAL HIGH (ref 65–99)
Potassium: 3.6 mmol/L (ref 3.5–5.1)
Sodium: 140 mmol/L (ref 135–145)
Total Bilirubin: 1.1 mg/dL (ref 0.3–1.2)
Total Protein: 8.3 g/dL — ABNORMAL HIGH (ref 6.5–8.1)

## 2016-01-02 LAB — URINALYSIS, ROUTINE W REFLEX MICROSCOPIC
Bilirubin Urine: NEGATIVE
Glucose, UA: >1000 mg/dL — AB
Ketones, ur: 40 mg/dL — AB
Leukocytes, UA: NEGATIVE
Nitrite: NEGATIVE
Protein, ur: 30 mg/dL — AB
Specific Gravity, Urine: 1.021 (ref 1.005–1.030)
pH: 6.5 (ref 5.0–8.0)

## 2016-01-02 LAB — CBG MONITORING, ED
GLUCOSE-CAPILLARY: 336 mg/dL — AB (ref 65–99)
Glucose-Capillary: 269 mg/dL — ABNORMAL HIGH (ref 65–99)

## 2016-01-02 LAB — URINE MICROSCOPIC-ADD ON
Bacteria, UA: NONE SEEN
Squamous Epithelial / LPF: NONE SEEN

## 2016-01-02 LAB — LIPASE, BLOOD: Lipase: 34 U/L (ref 11–51)

## 2016-01-02 MED ORDER — FAMOTIDINE 20 MG PO TABS: 20.0000 mg | ORAL_TABLET | Freq: Two times a day (BID) | ORAL | 0 refills | Status: DC

## 2016-01-02 MED ORDER — SODIUM CHLORIDE 0.9 % IV BOLUS (SEPSIS)
1000.0000 mL | Freq: Once | INTRAVENOUS | Status: AC
  Administered 2016-01-02: 1000 mL via INTRAVENOUS

## 2016-01-02 MED ORDER — INSULIN GLARGINE 100 UNIT/ML ~~LOC~~ SOLN
40.0000 [IU] | Freq: Once | SUBCUTANEOUS | Status: AC
  Administered 2016-01-03: 40 [IU] via SUBCUTANEOUS
  Filled 2016-01-02: qty 0.4

## 2016-01-02 MED ORDER — ONDANSETRON HCL 4 MG/2ML IJ SOLN
4.0000 mg | Freq: Once | INTRAMUSCULAR | Status: AC
  Administered 2016-01-02: 4 mg via INTRAVENOUS
  Filled 2016-01-02: qty 2

## 2016-01-02 MED ORDER — IOPAMIDOL (ISOVUE-300) INJECTION 61%
100.0000 mL | Freq: Once | INTRAVENOUS | Status: AC | PRN
  Administered 2016-01-02: 80 mL via INTRAVENOUS

## 2016-01-02 MED ORDER — FAMOTIDINE IN NACL 20-0.9 MG/50ML-% IV SOLN
20.0000 mg | Freq: Once | INTRAVENOUS | Status: AC
  Administered 2016-01-02: 20 mg via INTRAVENOUS
  Filled 2016-01-02: qty 50

## 2016-01-02 MED ORDER — GI COCKTAIL ~~LOC~~
30.0000 mL | Freq: Once | ORAL | Status: AC
  Administered 2016-01-02: 30 mL via ORAL
  Filled 2016-01-02: qty 30

## 2016-01-02 MED ORDER — MORPHINE SULFATE (PF) 4 MG/ML IV SOLN
6.0000 mg | Freq: Once | INTRAVENOUS | Status: AC
Start: 2016-01-02 — End: 2016-01-02
  Administered 2016-01-02: 6 mg via INTRAVENOUS
  Filled 2016-01-02: qty 2

## 2016-01-02 MED ORDER — INSULIN ASPART 100 UNIT/ML ~~LOC~~ SOLN
10.0000 [IU] | Freq: Once | SUBCUTANEOUS | Status: AC
  Administered 2016-01-02: 10 [IU] via INTRAVENOUS
  Filled 2016-01-02: qty 1

## 2016-01-02 MED ORDER — HALOPERIDOL LACTATE 5 MG/ML IJ SOLN
3.0000 mg | Freq: Once | INTRAMUSCULAR | Status: AC
  Administered 2016-01-02: 3 mg via INTRAVENOUS
  Filled 2016-01-02: qty 1

## 2016-01-02 NOTE — ED Triage Notes (Signed)
Per EMS, pt from home c/o generalized abdominal pain since this morning. Pt reports nausea and vomiting and denies diarrhea. CBG 327 with EMS. Hx diabetes.

## 2016-01-02 NOTE — ED Notes (Signed)
No respiratory or acute distress noted alert and oriented x 3 call light in reach no reaction to medication noted warm blanket given.

## 2016-01-02 NOTE — ED Notes (Signed)
No respiratory or acute distress noted alert and oriented x 3 call light in reach no reaction to medication noted. 

## 2016-01-02 NOTE — ED Provider Notes (Signed)
[facility] DEPT Provider Note   CSN: [id number] Arrival date & time: 01/02/16  1600  By signing my name below, I, [name], attest that this documentation has been prepared under the direction and in the presence of Wesley Manifold, MD. Electronically Signed: Reola [name], ED Scribe. 01/02/16. 4:40 PM.  History   Chief Complaint Chief Complaint  Patient presents with  . Abdominal Pain  . Hyperglycemia   The history is provided by the patient. No language interpreter was used.   HPI Comments: [name] is a 50 y.o. male BIB EMS, with a PMHx of alcoholic pancreatitis and CKD stage II, who presents to the Emergency Department complaining of gradual onset, gradually worsening epigastric abdominal pain onset ~13 hours ago. He describes his pain as burning, and notes that it has been constant. Pt reports associated nausea, streaky hematemesis, and chills secondary to his abdominal pain. His pain is exacerbated with laying flat. No alleviating factors noted.  Pt has not tolerated PO intake today PTA, and notes a decreased appetite since the onset of his pain. Pt has a hx of GERD, and notes that his pain is mildly similar. He has been compliant with his Protonix medications. Pt additionally notes that his at-home CBG levels have been high recently, and he has also been compliant with his at home DM medications. CBG was measured as 327 en route via EMS. Pt has a PSHx of complete nephrectomy performed through the abdomen. Denies urgency, frequency, hematuria, dysuria, difficulty urinating, diarrhea, polyuria, polyphagia, polydipsia, or any other associated symptoms.   Pt is additionally complaining of a gradually improving wound to the amputation site of his right BKA. Pt notes moderate pain to the area. No known trauma or injury to the area; however he does currently wear prosthesis.   Past Medical History:  Diagnosis Date  . Alcoholic pancreatitis 89/3810  . Below knee  amputation status ([place]) 12/12/2013  . Cellulitis and abscess of leg 01/31/2014  . Cellulitis of left foot 03/2008   left 4th and 5th metatarsal area  . CKD (chronic kidney disease) stage 2, GFR 60-89 ml/min    BL SCr 1.3-1.4  . Depression   . Diabetic foot ulcer ([place])    s/p Right first ray amputation, left transmetatarsal amputation with revision  . Diabetic peripheral neuropathy ([place])   . Exposure to trichomonas    treated empirically  . GERD (gastroesophageal reflux disease)   . History of chronic pyelonephritis    secondary to left pyeloureteral junction obstruction.  Marland Kitchen History of drug abuse    Cocaine and marijuana  . History of left below knee amputation ([place]) 01/21/2014   Performed on    . History of prolonged Q-T interval on ECG   . Hyperlipidemia   . Hypertension   . Mallory [name] tear April 2009  . Onychomycosis   . Osteomyelitis of left leg ([place]) 01/21/2014  . Peripheral vascular disease ([place])   . Recurrent Foot Osteomyelitis 02/19/2012   Recurrent osteomyelitis of toes with multiple ray amputations Follows up with Dr Sharol Given   . Renal and perinephric abscess 09/2008   s/p left nephrectomy,massive Left pyonephrosis, s/p 2L pus drained via percutaneous   . S/P Right BKA  10/09/2012   09/13/2012 due to osteomyelitis with sepsis. Healed nicely  Phantom limp pain Awaiting prosthetic   . Status post below knee amputation of right lower extremity ([place]) 03/23/2014  . Type 2 diabetes mellitus, uncontrolled, with renal complications ([place])   . Ulcerative  esophagitis 5188   severe, complicated with UGI bleed   Patient Active Problem List   Diagnosis Date Noted  . Pressure ulcer stage III ([place]) 11/11/2015  . Diabetic gastroparesis associated with type 2 diabetes mellitus ([place])   . Type 2 diabetes mellitus with hyperosmolar nonketotic hyperglycemia ([place]) 10/20/2015  . Esophagitis   . Left shoulder pain 08/26/2015  . DKA (diabetic ketoacidoses) ([place]) 06/01/2015  . Diabetic  ketoacidosis without coma ([place])   . Gastritis   . Intractable vomiting 03/12/2015  . Hyperglycemia due to type 2 diabetes mellitus ([place]) 03/12/2015  . Anemia 02/01/2015  . S/P bilateral BKA (below knee amputation) ([place]) 10/26/2014  . Hematemesis 10/26/2014  . Esophagitis (distal), erosive (09/2014) 09/16/2014    Class: Hospitalized for  . Phantom limb pain ([place]) 06/17/2014  . CN (constipation) 05/26/2014  . Erectile dysfunction 05/26/2014  . Anemia, iron deficiency 02/04/2014  . Hyperlipedemia 06/24/2012  . GERD (gastroesophageal reflux disease) 03/13/2012  . Depression 09/05/2011  . Marginal Housing 03/28/2011  . Essential hypertension 09/22/2008  . Diabetic neuropathy, painful ([place]) 07/21/2008  . CKD Stage 2 07/21/2008  . TOBACCO ABUSE 01/03/2006  . Polysubstance abuse 01/03/2006  . Diabetes mellitus type 2, uncontrolled, with complications ([place]) 41/66/0630   Past Surgical History:  Procedure Laterality Date  . AMPUTATION  08/24/2011   Procedure: AMPUTATION RAY;  Surgeon: Meredith Pel, MD;  Location: [facility];  Service: Orthopedics;  Laterality: Left;  left fourth toe Ray Resection  . AMPUTATION  12/05/2011   Procedure: AMPUTATION RAY;  Surgeon: Meredith Pel, MD;  Location: [facility];  Service: Orthopedics;  Laterality: Left;  . AMPUTATION  02/18/2012   Procedure: AMPUTATION DIGIT;  Surgeon: Mcarthur Rossetti, MD;  Location: [facility];  Service: Orthopedics;  Laterality: Left;  revison transmetatarsal  . AMPUTATION Right 09/01/2012   Procedure: FOOT 1ST RAY AMPUTATION;  Surgeon: Newt Minion, MD;  Location: [facility];  Service: Orthopedics;  Laterality: Right;  . AMPUTATION Left 09/01/2012   Procedure: FOOT REVISION TRANSMETATARSAL AMPUTATION ;  Surgeon: Newt Minion, MD;  Location: [facility];  Service: Orthopedics;  Laterality: Left;  . AMPUTATION Right 09/13/2012   Procedure: AMPUTATION BELOW KNEE;  Surgeon: Newt Minion, MD;  Location: [facility];  Service: Orthopedics;  Laterality:  Right;  Right Below Knee Amputation  . AMPUTATION Left 12/12/2013   Procedure: Left Below Knee Amputation;  Surgeon: Newt Minion, MD;  Location: [facility];  Service: Orthopedics;  Laterality: Left;  . BELOW KNEE LEG AMPUTATION Left 12/12/2013   Archie Endo 12/12/2013  . ESOPHAGOGASTRODUODENOSCOPY (EGD) WITH PROPOFOL N/A 09/16/2014   Procedure: ESOPHAGOGASTRODUODENOSCOPY (EGD) WITH PROPOFOL;  Surgeon: Wonda Horner, MD;  Location: [facility] ENDOSCOPY;  Service: Endoscopy;  Laterality: N/A;  . I&D EXTREMITY  08/24/2011   Procedure: IRRIGATION AND DEBRIDEMENT EXTREMITY;  Surgeon: Meredith Pel, MD;  Location: [facility];  Service: Orthopedics;  Laterality: Left;  . I&D EXTREMITY  02/18/2012   Procedure: IRRIGATION AND DEBRIDEMENT EXTREMITY;  Surgeon: Mcarthur Rossetti, MD;  Location: [place];  Service: Orthopedics;  Laterality: Left;  . I&D EXTREMITY Left 08/28/2012   Procedure: IRRIGATION AND DEBRIDEMENT EXTREMITY;  Surgeon: Linna Hoff, MD;  Location: [facility];  Service: Orthopedics;  Laterality: Left;  CYSTO TUBING/IRRIGATION, LEAD HAND.  . I&D EXTREMITY Left 08/30/2012   Procedure: IRRIGATION AND DEBRIDEMENT EXTREMITY;  Surgeon: Linna Hoff, MD;  Location: [place];  Service: Orthopedics;  Laterality: Left;  . NEPHRECTOMY Left   . STUMP REVISION Left 01/23/2014  Procedure: STUMP REVISION;  Surgeon: Newt Minion, MD;  Location: [place] [facility];  Service: Orthopedics;  Laterality: Left;  . TOE AMPUTATION Left 06/2011   4th toe    Home Medications    Prior to Admission medications   Medication Sig Start Date End Date Taking? Authorizing Provider  ACCU-CHEK FASTCLIX LANCETS MISC Check blood sugar before meals and bedtime. Dx code: E11.8 10/22/15   Milagros Loll, MD  amLODipine (NORVASC) 10 MG tablet Take 1 tablet (10 mg total) by mouth daily. 08/26/15   Liberty Handy, MD  atorvastatin (LIPITOR) 40 MG tablet Take 1 tablet (40 mg total) by mouth daily at 6 PM. 12/24/15   [name], MD  Blood Glucose Monitoring Suppl  (ACCU-CHEK NANO SMARTVIEW) w/Device KIT Check blood sugar before meals and bedtime. Dx code: E11.8 10/22/15   Milagros Loll, MD  diclofenac sodium (VOLTAREN) 1 % GEL Apply 4 g topically 4 (four) times daily. 08/26/15   Liberty Handy, MD  ferrous sulfate 325 (65 FE) MG tablet Take 1 tablet (325 mg total) by mouth daily with breakfast. 10/28/14   Ejiroghene E Denton Brick, MD  FLUoxetine (PROZAC) 20 MG capsule Take 1 capsule (20 mg total) by mouth daily. 12/23/15   [name], MD  gabapentin (NEURONTIN) 600 MG tablet Take 1 tablet (600 mg total) by mouth 3 (three) times daily. 02/01/15   [name], MD  glucose blood (ACCU-CHEK SMARTVIEW) test strip Check blood sugar before meals and bedtime. Dx code: E11.8 10/22/15   Milagros Loll, MD  insulin glargine (LANTUS) 100 UNIT/ML injection Inject 0.4 mLs (40 Units total) into the skin at bedtime. 10/22/15 10/21/16  Milagros Loll, MD  Insulin Syringe-Needle U-100 (EASY COMFORT INSULIN SYRINGE) 31G X 5/16" 1 ML MISC Use to inject insulin daily 10/22/15   Milagros Loll, MD  lisinopril (PRINIVIL,ZESTRIL) 20 MG tablet Take 1 tablet (20 mg total) by mouth daily. 10/28/14   Ejiroghene Arlyce Dice, MD  metFORMIN (GLUCOPHAGE XR) 500 MG 24 hr tablet Take 2 tablets (1,000 mg total) by mouth daily with breakfast. 11/12/15   Alphonzo Grieve, MD  metoCLOPramide (REGLAN) 10 MG tablet Take 1 tablet (10 mg total) by mouth 4 (four) times daily -  before meals and at bedtime. 10/22/15   Milagros Loll, MD  mupirocin cream (BACTROBAN) 2 % Apply topically daily. 11/12/15   Alphonzo Grieve, MD  pantoprazole (PROTONIX) 40 MG tablet Take 1 tablet (40 mg total) by mouth 2 (two) times daily. 10/22/15   Milagros Loll, MD  polyethylene glycol [facility] / Floria Raveling) packet Take 17 g by mouth daily. 10/22/15   Milagros Loll, MD  senna-docusate (SENOKOT-S) 8.6-50 MG tablet Take 1 tablet by mouth daily. 07/09/15   Tasrif Ahmed, MD  sildenafil (REVATIO) 20 MG tablet Take 2-5 tablets before  sexual activity. 08/25/14   [name], MD  sucralfate (CARAFATE) 1 GM/10ML suspension Take 10 mLs (1 g total) by mouth 4 (four) times daily -  with meals and at bedtime. 10/27/14   [name], MD    Family History Family History  Problem Relation Age of Onset  . Diabetes type II Mother   . Pancreatic cancer Father     Social History Social History  Substance Use Topics  . Smoking status: Current Every Day Smoker    Packs/day: 0.50    Years: 38.00    Types: Cigarettes, Cigars  . Smokeless tobacco: Never Used  . Alcohol use 6.6 oz/week    11 Shots  of liquor per week     Comment: 10/20/2015 "drink 1 pint/week"   Allergies   Review of patient's allergies indicates no known allergies.  Review of Systems Review of Systems  Constitutional: Positive for appetite change (decreased) and chills.  Gastrointestinal: Positive for abdominal pain (epigastric), nausea and vomiting. Negative for diarrhea.       Positive for hematemesis.   Endocrine: Negative for polydipsia, polyphagia and polyuria.  Genitourinary: Negative for difficulty urinating, dysuria, frequency, hematuria and urgency.  Skin: Positive for wound.  All other systems reviewed and are negative.  Physical Exam Updated Vital Signs BP (!) 167/125 (BP Location: Right Arm)   Pulse 102   Temp 97.6 F (36.4 C)   Resp 22   SpO2 100%   Physical Exam  Constitutional: He appears well-developed and well-nourished.  Sitting up in bed, appears uncomfortable  HENT:  Head: Normocephalic and atraumatic.  Mouth/Throat: Oropharynx is clear and moist. No oropharyngeal exudate.  Eyes: Conjunctivae and EOM are normal. Pupils are equal, round, and reactive to light. Right eye exhibits no discharge. Left eye exhibits no discharge. No scleral icterus.  Neck: Normal range of motion. Neck supple. No JVD present. No thyromegaly present.  Cardiovascular: Normal rate, regular rhythm, normal heart sounds and intact distal pulses.  Exam  reveals no gallop and no friction rub.   No murmur heard. Pulmonary/Chest: Effort normal and breath sounds normal. No respiratory distress. He has no wheezes. He has no rales.  Abdominal: Soft. Bowel sounds are normal. He exhibits no distension and no mass. There is tenderness.  Mild epigastric tenderness.   Musculoskeletal: He exhibits no edema or tenderness.  S/p bilateral BKAs. Right stump has a small ulceration which appears to be healing. Nontender to palpation. No drainage, no cellulitis.   Lymphadenopathy:    He has no cervical adenopathy.  Neurological: He is alert. Coordination normal.  Skin: Skin is warm and dry. No rash noted. No erythema.  Psychiatric: He has a normal mood and affect. His behavior is normal.  Nursing note and vitals reviewed.  ED Treatments / Results  DIAGNOSTIC STUDIES: Oxygen Saturation is 100% on RA, normal by my interpretation.   COORDINATION OF CARE: 4:35 PM-Discussed next steps with pt. Pt verbalized understanding and is agreeable with the plan.   Labs (all labs ordered are listed, but only abnormal results are displayed) Labs Reviewed - No data to display  EKG  EKG Interpretation None       Radiology No results found.  Procedures Procedures (including critical care time)  Medications Ordered in ED Medications - No data to display   Initial Impression / Assessment and Plan / ED Course  I have reviewed the triage vital signs and the nursing notes.  Pertinent labs & imaging results that were available during my care of the patient were reviewed by me and considered in my medical decision making (see chart for details).  Clinical Course    50yM with epigastric/CP. Probably is esophagitis with his description of it and CT findings. Now feeling better. Glucose noted as well as ketonuria and increased anion gap. No acidosis though. Given IVF and insulin. He is tolerating PO. It has been determined that no acute conditions requiring  further emergency intervention are present at this time. The patient has been advised of the diagnosis and plan. I reviewed any labs and imaging including any potential incidental findings. We have discussed signs and symptoms that warrant return to the ED and they are listed in the  discharge instructions.    Final Clinical Impressions(s) / ED Diagnoses   Final diagnoses:  Esophagitis  Non-intractable vomiting with nausea, vomiting of unspecified type    New Prescriptions New Prescriptions   No medications on file   I personally preformed the services scribed in my presence. The recorded information has been reviewed is accurate. Wesley Manifold, MD.     Wesley Manifold, MD 01/03/16 [phone]

## 2016-01-02 NOTE — ED Notes (Signed)
Bed: WA21 Expected date:  Expected time:  Means of arrival:  Comments: EMS- blood sugar issues

## 2016-01-02 NOTE — ED Notes (Signed)
MD at bedside. 

## 2016-01-02 NOTE — ED Notes (Signed)
States pain 8/10 no respiratory or acute distress noted alert and oriented x 3 call light in reach no reaction to medication noted.

## 2016-01-02 NOTE — ED Notes (Signed)
Per Dr. Wilson Singer, pt can have ice chips.

## 2016-01-03 NOTE — ED Notes (Signed)
No respiratory or acute distress noted alert and oriented x 3 call light in reach no reaction to medication noted. 

## 2016-01-05 ENCOUNTER — Other Ambulatory Visit: Payer: Self-pay | Admitting: Internal Medicine

## 2016-01-05 DIAGNOSIS — M25512 Pain in left shoulder: Secondary | ICD-10-CM

## 2016-01-05 MED ORDER — METFORMIN HCL ER 500 MG PO TB24: 1000.0000 mg | ORAL_TABLET | Freq: Every day | ORAL | 2 refills | Status: DC

## 2016-01-05 NOTE — Telephone Encounter (Signed)
Her Cr is 1.37 Will continue metformin for now- recheck BMET at next visit

## 2016-01-08 ENCOUNTER — Other Ambulatory Visit: Payer: Self-pay | Admitting: Internal Medicine

## 2016-01-10 NOTE — Telephone Encounter (Signed)
On GABA and elavil - combo may Increase sedation. Has appt PCP Nov. Will fill till then

## 2016-01-31 ENCOUNTER — Other Ambulatory Visit: Payer: Self-pay | Admitting: Internal Medicine

## 2016-01-31 NOTE — Telephone Encounter (Signed)
It does not appear that he is on Victoza, why would he need these pen needles?

## 2016-02-11 ENCOUNTER — Telehealth: Payer: Self-pay | Admitting: Internal Medicine

## 2016-02-11 NOTE — Telephone Encounter (Signed)
Patient requesting Prozac and Gabapentin

## 2016-02-14 ENCOUNTER — Encounter: Payer: Medicaid Other | Admitting: Internal Medicine

## 2016-02-14 ENCOUNTER — Other Ambulatory Visit: Payer: Self-pay | Admitting: Internal Medicine

## 2016-02-14 NOTE — Telephone Encounter (Signed)
Received refill request from pt's pharmacy for Zegrid 20-1100mg , this medication is no longer on medication list will send to pcp for review.  I did attempt to contact pt, but didn't get an answer, message left on recorder.Despina Hidden Cassady11/6/20171:38 PM

## 2016-02-17 NOTE — Telephone Encounter (Signed)
Pt on pantoprazole

## 2016-03-13 ENCOUNTER — Other Ambulatory Visit: Payer: Self-pay | Admitting: Internal Medicine

## 2016-03-15 ENCOUNTER — Encounter: Payer: Self-pay | Admitting: Internal Medicine

## 2016-03-15 ENCOUNTER — Other Ambulatory Visit: Payer: Self-pay | Admitting: Internal Medicine

## 2016-04-12 ENCOUNTER — Encounter (HOSPITAL_COMMUNITY): Payer: Self-pay

## 2016-04-12 ENCOUNTER — Emergency Department (HOSPITAL_COMMUNITY): Payer: Medicaid Other

## 2016-04-12 ENCOUNTER — Inpatient Hospital Stay (HOSPITAL_COMMUNITY)
Admission: EM | Admit: 2016-04-12 | Discharge: 2016-04-17 | DRG: 638 | Disposition: A | Discharge status: Skilled Nursing Facility | Payer: Medicaid Other | Attending: Internal Medicine | Admitting: Internal Medicine

## 2016-04-12 DIAGNOSIS — F17210 Nicotine dependence, cigarettes, uncomplicated: Secondary | ICD-10-CM | POA: Diagnosis present

## 2016-04-12 DIAGNOSIS — Z833 Family history of diabetes mellitus: Secondary | ICD-10-CM

## 2016-04-12 DIAGNOSIS — L03115 Cellulitis of right lower limb: Secondary | ICD-10-CM | POA: Diagnosis present

## 2016-04-12 DIAGNOSIS — L02415 Cutaneous abscess of right lower limb: Secondary | ICD-10-CM | POA: Diagnosis present

## 2016-04-12 DIAGNOSIS — Y835 Amputation of limb(s) as the cause of abnormal reaction of the patient, or of later complication, without mention of misadventure at the time of the procedure: Secondary | ICD-10-CM | POA: Diagnosis present

## 2016-04-12 DIAGNOSIS — IMO0002 Reserved for concepts with insufficient information to code with codable children: Secondary | ICD-10-CM | POA: Diagnosis present

## 2016-04-12 DIAGNOSIS — F1910 Other psychoactive substance abuse, uncomplicated: Secondary | ICD-10-CM | POA: Diagnosis present

## 2016-04-12 DIAGNOSIS — Z9114 Patient's other noncompliance with medication regimen: Secondary | ICD-10-CM

## 2016-04-12 DIAGNOSIS — N182 Chronic kidney disease, stage 2 (mild): Secondary | ICD-10-CM | POA: Diagnosis present

## 2016-04-12 DIAGNOSIS — J029 Acute pharyngitis, unspecified: Secondary | ICD-10-CM | POA: Diagnosis not present

## 2016-04-12 DIAGNOSIS — Z79899 Other long term (current) drug therapy: Secondary | ICD-10-CM

## 2016-04-12 DIAGNOSIS — F329 Major depressive disorder, single episode, unspecified: Secondary | ICD-10-CM | POA: Diagnosis present

## 2016-04-12 DIAGNOSIS — L0390 Cellulitis, unspecified: Secondary | ICD-10-CM

## 2016-04-12 DIAGNOSIS — Z89511 Acquired absence of right leg below knee: Secondary | ICD-10-CM

## 2016-04-12 DIAGNOSIS — E118 Type 2 diabetes mellitus with unspecified complications: Secondary | ICD-10-CM

## 2016-04-12 DIAGNOSIS — E11628 Type 2 diabetes mellitus with other skin complications: Secondary | ICD-10-CM | POA: Diagnosis present

## 2016-04-12 DIAGNOSIS — I129 Hypertensive chronic kidney disease with stage 1 through stage 4 chronic kidney disease, or unspecified chronic kidney disease: Secondary | ICD-10-CM | POA: Diagnosis present

## 2016-04-12 DIAGNOSIS — R739 Hyperglycemia, unspecified: Secondary | ICD-10-CM

## 2016-04-12 DIAGNOSIS — L03119 Cellulitis of unspecified part of limb: Secondary | ICD-10-CM | POA: Diagnosis present

## 2016-04-12 DIAGNOSIS — E785 Hyperlipidemia, unspecified: Secondary | ICD-10-CM | POA: Diagnosis present

## 2016-04-12 DIAGNOSIS — E1122 Type 2 diabetes mellitus with diabetic chronic kidney disease: Secondary | ICD-10-CM | POA: Diagnosis present

## 2016-04-12 DIAGNOSIS — E1151 Type 2 diabetes mellitus with diabetic peripheral angiopathy without gangrene: Secondary | ICD-10-CM | POA: Diagnosis present

## 2016-04-12 DIAGNOSIS — T8743 Infection of amputation stump, right lower extremity: Secondary | ICD-10-CM | POA: Diagnosis present

## 2016-04-12 DIAGNOSIS — K219 Gastro-esophageal reflux disease without esophagitis: Secondary | ICD-10-CM | POA: Diagnosis present

## 2016-04-12 DIAGNOSIS — E1142 Type 2 diabetes mellitus with diabetic polyneuropathy: Secondary | ICD-10-CM | POA: Diagnosis present

## 2016-04-12 DIAGNOSIS — Z89512 Acquired absence of left leg below knee: Secondary | ICD-10-CM

## 2016-04-12 DIAGNOSIS — Z905 Acquired absence of kidney: Secondary | ICD-10-CM

## 2016-04-12 DIAGNOSIS — E1110 Type 2 diabetes mellitus with ketoacidosis without coma: Principal | ICD-10-CM | POA: Diagnosis present

## 2016-04-12 DIAGNOSIS — Z794 Long term (current) use of insulin: Secondary | ICD-10-CM

## 2016-04-12 DIAGNOSIS — E1165 Type 2 diabetes mellitus with hyperglycemia: Secondary | ICD-10-CM | POA: Diagnosis present

## 2016-04-12 DIAGNOSIS — E1143 Type 2 diabetes mellitus with diabetic autonomic (poly)neuropathy: Secondary | ICD-10-CM | POA: Diagnosis present

## 2016-04-12 DIAGNOSIS — K3184 Gastroparesis: Secondary | ICD-10-CM | POA: Diagnosis present

## 2016-04-12 LAB — I-STAT CHEM 8, ED
BUN: 42 mg/dL — ABNORMAL HIGH (ref 6–20)
BUN: 34 mg/dL — ABNORMAL HIGH (ref 6–20)
CHLORIDE: 91 mmol/L — AB (ref 101–111)
CHLORIDE: 102 mmol/L (ref 101–111)
CREATININE: 1.7 mg/dL — AB (ref 0.61–1.24)
Calcium, Ion: 1.16 mmol/L (ref 1.15–1.40)
Calcium, Ion: 1.15 mmol/L (ref 1.15–1.40)
Creatinine, Ser: 1.6 mg/dL — ABNORMAL HIGH (ref 0.61–1.24)
Glucose, Bld: 342 mg/dL — ABNORMAL HIGH (ref 65–99)
HCT: 35 % — ABNORMAL LOW (ref 39.0–52.0)
HEMATOCRIT: 41 % (ref 39.0–52.0)
Hemoglobin: 13.9 g/dL (ref 13.0–17.0)
Hemoglobin: 11.9 g/dL — ABNORMAL LOW (ref 13.0–17.0)
POTASSIUM: 4.7 mmol/L (ref 3.5–5.1)
POTASSIUM: 4.5 mmol/L (ref 3.5–5.1)
SODIUM: 136 mmol/L (ref 135–145)
Sodium: 129 mmol/L — ABNORMAL LOW (ref 135–145)
TCO2: 20 mmol/L (ref 0–100)
TCO2: 21 mmol/L (ref 0–100)

## 2016-04-12 LAB — COMPREHENSIVE METABOLIC PANEL
ALT: 24 U/L (ref 17–63)
ANION GAP: 28 — AB (ref 5–15)
AST: 23 U/L (ref 15–41)
Albumin: 3.9 g/dL (ref 3.5–5.0)
Alkaline Phosphatase: 118 U/L (ref 38–126)
BUN: 39 mg/dL — ABNORMAL HIGH (ref 6–20)
CO2: 15 mmol/L — AB (ref 22–32)
Calcium: 9.8 mg/dL (ref 8.9–10.3)
Chloride: 88 mmol/L — ABNORMAL LOW (ref 101–111)
Creatinine, Ser: 2.24 mg/dL — ABNORMAL HIGH (ref 0.61–1.24)
GFR calc non Af Amer: 32 mL/min — ABNORMAL LOW (ref >60)
GFR, EST AFRICAN AMERICAN: 38 mL/min — AB (ref >60)
Glucose, Bld: 782 mg/dL (ref 65–99)
POTASSIUM: 4.8 mmol/L (ref 3.5–5.1)
SODIUM: 131 mmol/L — AB (ref 135–145)
Total Bilirubin: 0.8 mg/dL (ref 0.3–1.2)
Total Protein: 8.2 g/dL — ABNORMAL HIGH (ref 6.5–8.1)

## 2016-04-12 LAB — BASIC METABOLIC PANEL
Anion gap: 19 — ABNORMAL HIGH (ref 5–15)
BUN: 32 mg/dL — ABNORMAL HIGH (ref 6–20)
CO2: 17 mmol/L — ABNORMAL LOW (ref 22–32)
Calcium: 9.1 mg/dL (ref 8.9–10.3)
Chloride: 100 mmol/L — ABNORMAL LOW (ref 101–111)
Creatinine, Ser: 1.78 mg/dL — ABNORMAL HIGH (ref 0.61–1.24)
GFR calc Af Amer: 50 mL/min — ABNORMAL LOW (ref >60)
GFR calc non Af Amer: 43 mL/min — ABNORMAL LOW (ref >60)
Glucose, Bld: 328 mg/dL — ABNORMAL HIGH (ref 65–99)
Potassium: 4.6 mmol/L (ref 3.5–5.1)
Sodium: 136 mmol/L (ref 135–145)

## 2016-04-12 LAB — URINALYSIS, ROUTINE W REFLEX MICROSCOPIC
Bilirubin Urine: NEGATIVE
Ketones, ur: 20 mg/dL — AB
Leukocytes, UA: NEGATIVE
Nitrite: NEGATIVE
PROTEIN: NEGATIVE mg/dL
Specific Gravity, Urine: 1.021 (ref 1.005–1.030)
pH: 5 (ref 5.0–8.0)

## 2016-04-12 LAB — CBG MONITORING, ED
GLUCOSE-CAPILLARY: 582 mg/dL — AB (ref 65–99)
GLUCOSE-CAPILLARY: 502 mg/dL — AB (ref 65–99)
GLUCOSE-CAPILLARY: 468 mg/dL — AB (ref 65–99)
GLUCOSE-CAPILLARY: 321 mg/dL — AB (ref 65–99)
GLUCOSE-CAPILLARY: 221 mg/dL — AB (ref 65–99)
GLUCOSE-CAPILLARY: 188 mg/dL — AB (ref 65–99)
Glucose-Capillary: 103 mg/dL — ABNORMAL HIGH (ref 65–99)
Glucose-Capillary: 294 mg/dL — ABNORMAL HIGH (ref 65–99)
Glucose-Capillary: 352 mg/dL — ABNORMAL HIGH (ref 65–99)
Glucose-Capillary: >600 mg/dL (ref 65–99)

## 2016-04-12 LAB — I-STAT VENOUS BLOOD GAS, ED
Acid-base deficit: 8 mmol/L — ABNORMAL HIGH (ref 0.0–2.0)
BICARBONATE: 17.2 mmol/L — AB (ref 20.0–28.0)
O2 SAT: 72 %
PCO2 VEN: 35 mmHg — AB (ref 44.0–60.0)
PH VEN: 7.3 (ref 7.250–7.430)
PO2 VEN: 41 mmHg (ref 32.0–45.0)
TCO2: 18 mmol/L (ref 0–100)

## 2016-04-12 LAB — CBC
HCT: 37.3 % — ABNORMAL LOW (ref 39.0–52.0)
HEMOGLOBIN: 12.4 g/dL — AB (ref 13.0–17.0)
MCH: 29.2 pg (ref 26.0–34.0)
MCHC: 33.2 g/dL (ref 30.0–36.0)
MCV: 88 fL (ref 78.0–100.0)
Platelets: 548 10*3/uL — ABNORMAL HIGH (ref 150–400)
RBC: 4.24 MIL/uL (ref 4.22–5.81)
RDW: 13.6 % (ref 11.5–15.5)
WBC: 23.2 10*3/uL — ABNORMAL HIGH (ref 4.0–10.5)

## 2016-04-12 LAB — PROCALCITONIN: Procalcitonin: <0.1 ng/mL

## 2016-04-12 LAB — RAPID URINE DRUG SCREEN, HOSP PERFORMED
AMPHETAMINES: NOT DETECTED
BENZODIAZEPINES: NOT DETECTED
Barbiturates: NOT DETECTED
Cocaine: POSITIVE — AB
Opiates: NOT DETECTED
TETRAHYDROCANNABINOL: POSITIVE — AB

## 2016-04-12 LAB — I-STAT CG4 LACTIC ACID, ED
LACTIC ACID, VENOUS: 1.83 mmol/L (ref 0.5–1.9)
Lactic Acid, Venous: 2.54 mmol/L (ref 0.5–1.9)

## 2016-04-12 LAB — LIPASE, BLOOD: LIPASE: 33 U/L (ref 11–51)

## 2016-04-12 MED ORDER — SODIUM CHLORIDE 0.9 % IV SOLN
INTRAVENOUS | Status: DC
  Administered 2016-04-12: 7.8 [IU]/h via INTRAVENOUS
  Filled 2016-04-12: qty 2.5

## 2016-04-12 MED ORDER — DEXTROSE-NACL 5-0.45 % IV SOLN: INTRAVENOUS | Status: DC

## 2016-04-12 MED ORDER — GADOBENATE DIMEGLUMINE 529 MG/ML IV SOLN
10.0000 mL | Freq: Once | INTRAVENOUS | Status: AC
  Administered 2016-04-12: 9 mL via INTRAVENOUS

## 2016-04-12 MED ORDER — PIPERACILLIN-TAZOBACTAM 3.375 G IVPB
3.3750 g | Freq: Three times a day (TID) | INTRAVENOUS | Status: DC
  Administered 2016-04-12 – 2016-04-15 (×7): 3.375 g via INTRAVENOUS
  Filled 2016-04-12 (×9): qty 50

## 2016-04-12 MED ORDER — SODIUM CHLORIDE 0.9 % IV SOLN
INTRAVENOUS | Status: DC
  Administered 2016-04-12: 5.4 [IU]/h via INTRAVENOUS
  Filled 2016-04-12: qty 2.5

## 2016-04-12 MED ORDER — SODIUM CHLORIDE 0.9 % IV BOLUS (SEPSIS)
1000.0000 mL | Freq: Once | INTRAVENOUS | Status: AC
  Administered 2016-04-12: 1000 mL via INTRAVENOUS

## 2016-04-12 MED ORDER — SODIUM CHLORIDE 0.9 % IV SOLN
INTRAVENOUS | Status: DC
  Administered 2016-04-12: 15:00:00 via INTRAVENOUS

## 2016-04-12 MED ORDER — DEXTROSE-NACL 5-0.45 % IV SOLN
INTRAVENOUS | Status: DC
  Administered 2016-04-12: 21:00:00 via INTRAVENOUS

## 2016-04-12 MED ORDER — INSULIN REGULAR BOLUS VIA INFUSION
0.0000 [IU] | Freq: Three times a day (TID) | INTRAVENOUS | Status: DC
  Filled 2016-04-12: qty 10

## 2016-04-12 MED ORDER — MORPHINE SULFATE (PF) 4 MG/ML IV SOLN
4.0000 mg | Freq: Once | INTRAVENOUS | Status: AC
  Administered 2016-04-12: 4 mg via INTRAVENOUS
  Filled 2016-04-12: qty 1

## 2016-04-12 MED ORDER — ONDANSETRON HCL 4 MG/2ML IJ SOLN
4.0000 mg | Freq: Once | INTRAMUSCULAR | Status: AC | PRN
  Administered 2016-04-12: 4 mg via INTRAVENOUS
  Filled 2016-04-12: qty 2

## 2016-04-12 MED ORDER — DEXTROSE 50 % IV SOLN: 25.0000 mL | INTRAVENOUS | Status: DC | PRN

## 2016-04-12 MED ORDER — SODIUM CHLORIDE 0.45 % IV SOLN: INTRAVENOUS | Status: DC

## 2016-04-12 MED ORDER — VANCOMYCIN HCL 10 G IV SOLR
1250.0000 mg | INTRAVENOUS | Status: DC
  Administered 2016-04-12 – 2016-04-13 (×2): 1250 mg via INTRAVENOUS
  Filled 2016-04-12 (×2): qty 1250

## 2016-04-12 NOTE — ED Triage Notes (Addendum)
Per EMS - pt c/o lethargy. Pt has been out of lantus x 3 days. CBG 521. Peaked t-waves on 12-lead. Hx bilateral BKAs, HTN

## 2016-04-12 NOTE — ED Notes (Signed)
Pt transported to xray and MRI

## 2016-04-12 NOTE — ED Notes (Signed)
Pt remains at MRI

## 2016-04-12 NOTE — ED Provider Notes (Signed)
[facility] DEPT Provider Note   CSN: [id number] Arrival date & time: 04/12/16  5374     History   Chief Complaint Chief Complaint  Patient presents with  . Hyperglycemia    HPI [name] is a 51 y.o. male.  HPI Patient presents to the emergency department with weakness and elevated blood sugar.  Patient states he is also concerned that his stump has an infection due to the fact that he has a wound that is draining in that area.  The patient states that nothing seems make the condition better or worse.  She states that the wounds been present for a while, but just started draining over the last few days. The patient denies chest pain, shortness of breath, headache,blurred vision, neck pain, fever, cough,  numbness, dizziness, anorexia, edema, abdominal pain, nausea, vomiting, diarrhea, rash, back pain, dysuria, hematemesis, bloody stool, near syncope, or syncope. Past Medical History:  Diagnosis Date  . 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 Below knee amputation status ([place]) 12/12/2013  . Cellulitis and abscess of leg 01/31/2014  . Cellulitis of left foot 03/2008   left 4th and 5th metatarsal area  . CKD (chronic kidney disease) stage 2, GFR 60-89 ml/min    BL SCr 1.3-1.4  . Depression   . Diabetic foot ulcer ([place])    s/p Right first ray amputation, left transmetatarsal amputation with revision  . Diabetic peripheral neuropathy ([place])   . Exposure to trichomonas    treated empirically  . GERD (gastroesophageal reflux disease)   . History of chronic pyelonephritis    secondary to left pyeloureteral junction obstruction.  Marland Kitchen History of drug abuse    Cocaine and marijuana  . History of left below knee amputation ([place]) 01/21/2014   Performed on    . History of prolonged Q-T interval on ECG   . Hyperlipidemia   . Hypertension   . Mallory [name] tear April 2009  . Onychomycosis   . Osteomyelitis of left leg ([place]) 01/21/2014  . Peripheral vascular disease ([place])   . Recurrent  Foot Osteomyelitis 02/19/2012   Recurrent osteomyelitis of toes with multiple ray amputations Follows up with Dr Sharol Given   . Renal and perinephric abscess 09/2008   s/p left nephrectomy,massive Left pyonephrosis, s/p 2L pus drained via percutaneous   . S/P Right BKA  10/09/2012   09/13/2012 due to osteomyelitis with sepsis. Healed nicely  Phantom limp pain Awaiting prosthetic   . Status post below knee amputation of right lower extremity ([place]) 03/23/2014  . Type 2 diabetes mellitus, uncontrolled, with renal complications ([place])   . Ulcerative esophagitis 6754   severe, complicated with UGI bleed    Patient Active Problem List   Diagnosis Date Noted  . Pressure ulcer stage III 11/11/2015  . Diabetic gastroparesis associated with type 2 diabetes mellitus ([place])   . Type 2 diabetes mellitus with hyperosmolar nonketotic hyperglycemia ([place]) 10/20/2015  . Esophagitis   . Left shoulder pain 08/26/2015  . DKA (diabetic ketoacidoses) ([place]) 06/01/2015  . Diabetic ketoacidosis without coma ([place])   . Gastritis   . Intractable vomiting 03/12/2015  . Hyperglycemia due to type 2 diabetes mellitus ([place]) 03/12/2015  . Anemia 02/01/2015  . S/P bilateral BKA (below knee amputation) ([place]) 10/26/2014  . Hematemesis 10/26/2014  . Esophagitis (distal), erosive (09/2014) 09/16/2014    Class: Hospitalized for  . Phantom limb pain ([place]) 06/17/2014  . CN (constipation) 05/26/2014  . Erectile dysfunction 05/26/2014  . Anemia, iron deficiency 02/04/2014  . Hyperlipedemia 06/24/2012  .  GERD (gastroesophageal reflux disease) 03/13/2012  . Depression 09/05/2011  . Marginal Housing 03/28/2011  . Essential hypertension 09/22/2008  . Diabetic neuropathy, painful ([place]) 07/21/2008  . CKD Stage 2 07/21/2008  . TOBACCO ABUSE 01/03/2006  . Polysubstance abuse 01/03/2006  . Diabetes mellitus type 2, uncontrolled, with complications ([place]) 63/87/5643    Past Surgical History:  Procedure Laterality Date  . AMPUTATION   08/24/2011   Procedure: AMPUTATION RAY;  Surgeon: Meredith Pel, MD;  Location: [facility];  Service: Orthopedics;  Laterality: Left;  left fourth toe Ray Resection  . AMPUTATION  12/05/2011   Procedure: AMPUTATION RAY;  Surgeon: Meredith Pel, MD;  Location: [facility];  Service: Orthopedics;  Laterality: Left;  . AMPUTATION  02/18/2012   Procedure: AMPUTATION DIGIT;  Surgeon: Mcarthur Rossetti, MD;  Location: [facility];  Service: Orthopedics;  Laterality: Left;  revison transmetatarsal  . AMPUTATION Right 09/01/2012   Procedure: FOOT 1ST RAY AMPUTATION;  Surgeon: Newt Minion, MD;  Location: [facility];  Service: Orthopedics;  Laterality: Right;  . AMPUTATION Left 09/01/2012   Procedure: FOOT REVISION TRANSMETATARSAL AMPUTATION ;  Surgeon: Newt Minion, MD;  Location: [facility];  Service: Orthopedics;  Laterality: Left;  . AMPUTATION Right 09/13/2012   Procedure: AMPUTATION BELOW KNEE;  Surgeon: Newt Minion, MD;  Location: [facility];  Service: Orthopedics;  Laterality: Right;  Right Below Knee Amputation  . AMPUTATION Left 12/12/2013   Procedure: Left Below Knee Amputation;  Surgeon: Newt Minion, MD;  Location: [facility];  Service: Orthopedics;  Laterality: Left;  . BELOW KNEE LEG AMPUTATION Left 12/12/2013   Archie Endo 12/12/2013  . ESOPHAGOGASTRODUODENOSCOPY (EGD) WITH PROPOFOL N/A 09/16/2014   Procedure: ESOPHAGOGASTRODUODENOSCOPY (EGD) WITH PROPOFOL;  Surgeon: Wonda Horner, MD;  Location: [facility];  Service: Endoscopy;  Laterality: N/A;  . I&D EXTREMITY  08/24/2011   Procedure: IRRIGATION AND DEBRIDEMENT EXTREMITY;  Surgeon: Meredith Pel, MD;  Location: [facility];  Service: Orthopedics;  Laterality: Left;  . I&D EXTREMITY  02/18/2012   Procedure: IRRIGATION AND DEBRIDEMENT EXTREMITY;  Surgeon: Mcarthur Rossetti, MD;  Location: [facility];  Service: Orthopedics;  Laterality: Left;  . I&D EXTREMITY Left 08/28/2012   Procedure: IRRIGATION AND DEBRIDEMENT EXTREMITY;  Surgeon: Linna Hoff, MD;  Location: [facility];   Service: Orthopedics;  Laterality: Left;  CYSTO TUBING/IRRIGATION, LEAD HAND.  . I&D EXTREMITY Left 08/30/2012   Procedure: IRRIGATION AND DEBRIDEMENT EXTREMITY;  Surgeon: Linna Hoff, MD;  Location: [facility];  Service: Orthopedics;  Laterality: Left;  . NEPHRECTOMY Left   . STUMP REVISION Left 01/23/2014   Procedure: STUMP REVISION;  Surgeon: Newt Minion, MD;  Location: [facility];  Service: Orthopedics;  Laterality: Left;  . TOE AMPUTATION Left 06/2011   4th toe       Home Medications    Prior to Admission medications   Medication Sig Start Date End Date Taking? Authorizing Provider  ACCU-CHEK FASTCLIX LANCETS MISC Check blood sugar before meals and bedtime. Dx code: E11.8 10/22/15  Yes Milagros Loll, MD  amLODipine (NORVASC) 10 MG tablet Take 1 tablet (10 mg total) by mouth daily. 08/26/15  Yes Liberty Handy, MD  atorvastatin (LIPITOR) 40 MG tablet Take 1 tablet (40 mg total) by mouth daily at 6 PM. 12/24/15  Yes [name], MD  Blood Glucose Monitoring Suppl (ACCU-CHEK NANO SMARTVIEW) w/Device KIT Check blood sugar before meals and bedtime. Dx code: E11.8 10/22/15  Yes Milagros Loll, MD  famotidine (PEPCID) 20 MG  tablet Take 1 tablet (20 mg total) by mouth 2 (two) times daily. 01/02/16  Yes [name], MD  ferrous sulfate 325 (65 FE) MG tablet Take 1 tablet (325 mg total) by mouth daily with breakfast. 10/28/14  Yes Ejiroghene E Emokpae, MD  FLUoxetine (PROZAC) 20 MG capsule Take 1 capsule (20 mg total) by mouth daily. 03/16/16  Yes [name], MD  gabapentin (NEURONTIN) 600 MG tablet TAKE 1 TABLET BY MOUTH THREE TIMES DAILY 01/10/16  Yes [name], MD  glucose blood (ACCU-CHEK SMARTVIEW) test strip Check blood sugar before meals and bedtime. Dx code: E11.8 10/22/15  Yes Milagros Loll, MD  insulin glargine (LANTUS) 100 UNIT/ML injection Inject 0.4 mLs (40 Units total) into the skin at bedtime. 10/22/15 10/21/16 Yes Milagros Loll, MD  Insulin Pen Needle (EASY TOUCH PEN  NEEDLES) 29G X 12MM MISC diag code E11.8. Insulin dependent. Use to inject insulin into the skin 1 time daily 02/01/16  Yes Axel Filler, MD  Insulin Syringe-Needle U-100 (EASY COMFORT INSULIN SYRINGE) 31G X 5/16" 1 ML MISC Use to inject insulin daily 10/22/15  Yes Milagros Loll, MD  lisinopril (PRINIVIL,ZESTRIL) 20 MG tablet Take 1 tablet (20 mg total) by mouth daily. 10/28/14  Yes Ejiroghene Arlyce Dice, MD  metFORMIN (GLUCOPHAGE XR) 500 MG 24 hr tablet Take 2 tablets (1,000 mg total) by mouth daily with breakfast. 01/05/16  Yes [name], MD  metoCLOPramide (REGLAN) 10 MG tablet Take 1 tablet (10 mg total) by mouth 4 (four) times daily -  before meals and at bedtime. 10/22/15  Yes Milagros Loll, MD  mupirocin cream (BACTROBAN) 2 % Apply topically daily. 11/12/15  Yes Alphonzo Grieve, MD  NOVOLOG 100 UNIT/ML injection Inject 0-15 Units as directed 3 (three) times daily with meals. 12/02/15  Yes Historical Provider, MD  pantoprazole (PROTONIX) 40 MG tablet Take 1 tablet (40 mg total) by mouth 2 (two) times daily. 10/22/15  Yes Milagros Loll, MD  polyethylene glycol [facility] / GLYCOLAX) packet Take 17 g by mouth daily. Patient taking differently: Take 17 g by mouth daily as needed for mild constipation.  10/22/15  Yes Milagros Loll, MD  senna-docusate (SENOKOT-S) 8.6-50 MG tablet Take 1 tablet by mouth daily. 07/09/15  Yes Tasrif Ahmed, MD  VOLTAREN 1 % GEL Apply 4 g topically 4 (four) times daily. Patient taking differently: Apply 4 g topically daily as needed (pain).  01/05/16  Yes [name], MD  sildenafil (REVATIO) 20 MG tablet Take 2-5 tablets before sexual activity. Patient not taking: Reported on 04/12/2016 08/25/14   [name], MD  sucralfate (CARAFATE) 1 GM/10ML suspension Take 10 mLs (1 g total) by mouth 4 (four) times daily -  with meals and at bedtime. Patient not taking: Reported on 04/12/2016 10/27/14   [name], MD    Family History Family History  Problem  Relation Age of Onset  . Diabetes type II Mother   . Pancreatic cancer Father     Social History Social History  Substance Use Topics  . Smoking status: Current Every Day Smoker    Packs/day: 0.50    Years: 38.00    Types: Cigarettes, Cigars  . Smokeless tobacco: Never Used  . Alcohol use 6.6 oz/week    11 Shots of liquor per week     Comment: 10/20/2015 "drink 1 pint/week"     Allergies   Patient has no known allergies.   Review of Systems Review of Systems  All other systems  negative except as documented in the HPI. All pertinent positives and negatives as reviewed in the HPI. Physical Exam Updated Vital Signs BP 152/68   Pulse 96   Temp 97.5 F (36.4 C) (Oral)   Resp 15   SpO2 99%   Physical Exam  Constitutional: He is oriented to person, place, and time. He appears well-developed and well-nourished. No distress.  HENT:  Head: Normocephalic and atraumatic.  Mouth/Throat: Oropharynx is clear and moist.  Eyes: Pupils are equal, round, and reactive to light.  Neck: Normal range of motion. Neck supple.  Cardiovascular: Normal rate, regular rhythm and normal heart sounds.  Exam reveals no gallop and no friction rub.   No murmur heard. Pulmonary/Chest: Effort normal and breath sounds normal. No respiratory distress. He has no wheezes.  Abdominal: Soft. Bowel sounds are normal. He exhibits no distension. There is no tenderness.  Musculoskeletal:       Legs: Neurological: He is alert and oriented to person, place, and time. He exhibits normal muscle tone. Coordination normal.  Skin: Skin is warm and dry. No rash noted. No erythema.  Psychiatric: He has a normal mood and affect. His behavior is normal.  Nursing note and vitals reviewed.    ED Treatments / Results  Labs (all labs ordered are listed, but only abnormal results are displayed) Labs Reviewed  CBC - Abnormal; Notable for the following:       Result Value   WBC 23.2 (*)    Hemoglobin 12.4 (*)    HCT  37.3 (*)    Platelets 548 (*)    All other components within normal limits  URINALYSIS, ROUTINE W REFLEX MICROSCOPIC - Abnormal; Notable for the following:    Color, Urine COLORLESS (*)    Glucose, UA >=500 (*)    Hgb urine dipstick SMALL (*)    Ketones, ur 20 (*)    Bacteria, UA RARE (*)    Squamous Epithelial / LPF 0-5 (*)    All other components within normal limits  COMPREHENSIVE METABOLIC PANEL - Abnormal; Notable for the following:    Sodium 131 (*)    Chloride 88 (*)    CO2 15 (*)    Glucose, Bld 782 (*)    BUN 39 (*)    Creatinine, Ser 2.24 (*)    Total Protein 8.2 (*)    GFR calc non Af Amer 32 (*)    GFR calc Af Amer 38 (*)    Anion gap 28 (*)    All other components within normal limits  CBG MONITORING, ED - Abnormal; Notable for the following:    Glucose-Capillary >600 (*)    All other components within normal limits  CBG MONITORING, ED - Abnormal; Notable for the following:    Glucose-Capillary 582 (*)    All other components within normal limits  I-STAT VENOUS BLOOD GAS, ED - Abnormal; Notable for the following:    pCO2, Ven 35.0 (*)    Bicarbonate 17.2 (*)    Acid-base deficit 8.0 (*)    All other components within normal limits  I-STAT CG4 LACTIC ACID, ED - Abnormal; Notable for the following:    Lactic Acid, Venous 2.54 (*)    All other components within normal limits  I-STAT CHEM 8, ED - Abnormal; Notable for the following:    Sodium 129 (*)    Chloride 91 (*)    BUN 42 (*)    Creatinine, Ser 1.70 (*)    Glucose, Bld >700 (*)  All other components within normal limits  CBG MONITORING, ED - Abnormal; Notable for the following:    Glucose-Capillary 502 (*)    All other components within normal limits  CBG MONITORING, ED - Abnormal; Notable for the following:    Glucose-Capillary 468 (*)    All other components within normal limits  CBG MONITORING, ED - Abnormal; Notable for the following:    Glucose-Capillary 352 (*)    All other components within  normal limits  LIPASE, BLOOD  I-STAT CG4 LACTIC ACID, ED    EKG  EKG Interpretation  Date/Time:  Wednesday April 12 2016 09:47:45 EST Ventricular Rate:  95 PR Interval:    QRS Duration: 105 QT Interval:  398 QTC Calculation: 501 R Axis:   -58 Text Interpretation:  Sinus rhythm Left anterior fascicular block Abnormal R-wave progression, early transition Left ventricular hypertrophy Prolonged QT interval No significant change since last tracing Confirmed by [place] ([id number]) on 04/12/2016 10:24:31 AM       Radiology Dg Knee 2 Views Right  Result Date: 04/12/2016 CLINICAL DATA:  Possible stump infection. EXAM: RIGHT KNEE - 1-2 VIEW COMPARISON:  None. FINDINGS: Status post right below-knee amputation. No acute fracture or dislocation is noted. No joint effusion is noted. No lytic destruction is seen to suggest osteomyelitis. No soft tissue abnormality is noted. IMPRESSION: Status post right below-knee amputation. No lytic destruction is seen to suggest osteomyelitis. Electronically Signed   By: Marijo Conception, M.D.   On: 04/12/2016 16:19    Procedures Procedures (including critical care time)  Medications Ordered in ED Medications  dextrose 5 %-0.45 % sodium chloride infusion ( Intravenous Hold 04/12/16 1114)  insulin regular (NOVOLIN R,HUMULIN R) 250 Units in sodium chloride 0.9 % 250 mL (1 Units/mL) infusion (0 Units/hr Intravenous Stopped 04/12/16 1558)  sodium chloride 0.9 % bolus 1,000 mL (0 mLs Intravenous Stopped 04/12/16 1215)    And  sodium chloride 0.9 % bolus 1,000 mL (0 mLs Intravenous Stopped 04/12/16 1315)    And  0.9 %  sodium chloride infusion ( Intravenous New Bag/Given 04/12/16 1442)  ondansetron (ZOFRAN) injection 4 mg (4 mg Intravenous Given 04/12/16 1017)  sodium chloride 0.9 % bolus 1,000 mL (0 mLs Intravenous Stopped 04/12/16 1200)  morphine 4 MG/ML injection 4 mg (4 mg Intravenous Given 04/12/16 1604)     Initial Impression / Assessment and Plan / ED Course    I have reviewed the triage vital signs and the nursing notes.  Pertinent labs & imaging results that were available during my care of the patient were reviewed by me and considered in my medical decision making (see chart for details).  Clinical Course     The patient will need admission  Final Clinical Impressions(s) / ED Diagnoses   Final diagnoses:  Cellulitis of leg    New Prescriptions New Prescriptions   No medications on file     Dalia Heading, PA-C 04/12/16 [place], MD 04/15/16 1147
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
## 2016-04-12 NOTE — Progress Notes (Signed)
Pharmacy Antibiotic Note  [name] is a 51 y.o. male admitted on 04/12/2016 with cellulitis.  Pharmacy has been consulted for vancomycin and Zosyn dosing.  Patient with bilateral BKAs. He told me his weight was "140 something", but appears to be slightly bigger than that. SCr 2.24, appears to be up from baseline of ~1.3.  Plan: Vancomycin 1250mg  IV every 24 hours.  Goal trough 10-15 mcg/mL. Zosyn 3.375g IV q8h (4 hour infusion). Follow renal function closely Follow c/s, clinical progression, and ability to de-escalate  Weight: 150 lb (68 kg)  Temp (24hrs), Avg:97.5 F (36.4 C), Min:97.5 F (36.4 C), Max:97.5 F (36.4 C)   Recent Labs Lab 04/12/16 1001 04/12/16 1002 04/12/16 1013 04/12/16 1351 04/12/16 1837 04/12/16 1914  WBC 23.2*  --   --   --   --   --   CREATININE  --  2.24* 1.70*  --  1.78* 1.60*  LATICACIDVEN  --   --  2.54* 1.83  --   --     Estimated Creatinine Clearance: 53.1 mL/min (by C-G formula based on SCr of 1.6 mg/dL (H)).    No Known Allergies  Antimicrobials this admission: vancomycin 1/3 >>  Zosyn 1/3 >>   Dose adjustments this admission: n/a  Microbiology results: 1/2 BCx:    Thank you for allowing pharmacy to be a part of this patient's care.  Shanekqua Schaper D. Poonam Woehrle, PharmD, BCPS Clinical Pharmacist Pager: [phone] 04/12/2016 8:09 PM

## 2016-04-12 NOTE — ED Notes (Signed)
Pt CBG read HIGH, nurse was notified/

## 2016-04-12 NOTE — ED Notes (Signed)
Verbal order to d/c insulin drip per Gerald Stabs, [place] and Dr. Billy Fischer, EDP

## 2016-04-13 ENCOUNTER — Encounter (HOSPITAL_COMMUNITY): Payer: Self-pay | Admitting: General Practice

## 2016-04-13 DIAGNOSIS — L03115 Cellulitis of right lower limb: Secondary | ICD-10-CM | POA: Diagnosis present

## 2016-04-13 DIAGNOSIS — E785 Hyperlipidemia, unspecified: Secondary | ICD-10-CM | POA: Diagnosis present

## 2016-04-13 DIAGNOSIS — E1110 Type 2 diabetes mellitus with ketoacidosis without coma: Secondary | ICD-10-CM | POA: Diagnosis not present

## 2016-04-13 DIAGNOSIS — J029 Acute pharyngitis, unspecified: Secondary | ICD-10-CM | POA: Diagnosis not present

## 2016-04-13 DIAGNOSIS — Y835 Amputation of limb(s) as the cause of abnormal reaction of the patient, or of later complication, without mention of misadventure at the time of the procedure: Secondary | ICD-10-CM | POA: Diagnosis present

## 2016-04-13 DIAGNOSIS — T8743 Infection of amputation stump, right lower extremity: Secondary | ICD-10-CM | POA: Diagnosis present

## 2016-04-13 DIAGNOSIS — F329 Major depressive disorder, single episode, unspecified: Secondary | ICD-10-CM | POA: Diagnosis present

## 2016-04-13 DIAGNOSIS — L02415 Cutaneous abscess of right lower limb: Secondary | ICD-10-CM | POA: Diagnosis present

## 2016-04-13 DIAGNOSIS — E1142 Type 2 diabetes mellitus with diabetic polyneuropathy: Secondary | ICD-10-CM | POA: Diagnosis present

## 2016-04-13 DIAGNOSIS — Z89512 Acquired absence of left leg below knee: Secondary | ICD-10-CM | POA: Diagnosis not present

## 2016-04-13 DIAGNOSIS — L03119 Cellulitis of unspecified part of limb: Secondary | ICD-10-CM | POA: Diagnosis not present

## 2016-04-13 DIAGNOSIS — E1310 Other specified diabetes mellitus with ketoacidosis without coma: Secondary | ICD-10-CM | POA: Diagnosis not present

## 2016-04-13 DIAGNOSIS — F1910 Other psychoactive substance abuse, uncomplicated: Secondary | ICD-10-CM | POA: Diagnosis present

## 2016-04-13 DIAGNOSIS — Z79899 Other long term (current) drug therapy: Secondary | ICD-10-CM | POA: Diagnosis not present

## 2016-04-13 DIAGNOSIS — Z905 Acquired absence of kidney: Secondary | ICD-10-CM | POA: Diagnosis not present

## 2016-04-13 DIAGNOSIS — E11628 Type 2 diabetes mellitus with other skin complications: Secondary | ICD-10-CM | POA: Diagnosis present

## 2016-04-13 DIAGNOSIS — L0390 Cellulitis, unspecified: Secondary | ICD-10-CM | POA: Diagnosis not present

## 2016-04-13 DIAGNOSIS — K219 Gastro-esophageal reflux disease without esophagitis: Secondary | ICD-10-CM | POA: Diagnosis present

## 2016-04-13 DIAGNOSIS — N182 Chronic kidney disease, stage 2 (mild): Secondary | ICD-10-CM | POA: Diagnosis present

## 2016-04-13 DIAGNOSIS — Z833 Family history of diabetes mellitus: Secondary | ICD-10-CM | POA: Diagnosis not present

## 2016-04-13 DIAGNOSIS — I129 Hypertensive chronic kidney disease with stage 1 through stage 4 chronic kidney disease, or unspecified chronic kidney disease: Secondary | ICD-10-CM | POA: Diagnosis present

## 2016-04-13 DIAGNOSIS — F17210 Nicotine dependence, cigarettes, uncomplicated: Secondary | ICD-10-CM | POA: Diagnosis present

## 2016-04-13 DIAGNOSIS — K3184 Gastroparesis: Secondary | ICD-10-CM | POA: Diagnosis present

## 2016-04-13 DIAGNOSIS — E1151 Type 2 diabetes mellitus with diabetic peripheral angiopathy without gangrene: Secondary | ICD-10-CM | POA: Diagnosis present

## 2016-04-13 DIAGNOSIS — E1143 Type 2 diabetes mellitus with diabetic autonomic (poly)neuropathy: Secondary | ICD-10-CM | POA: Diagnosis present

## 2016-04-13 DIAGNOSIS — Z794 Long term (current) use of insulin: Secondary | ICD-10-CM | POA: Diagnosis not present

## 2016-04-13 DIAGNOSIS — Z89511 Acquired absence of right leg below knee: Secondary | ICD-10-CM | POA: Diagnosis not present

## 2016-04-13 DIAGNOSIS — E1122 Type 2 diabetes mellitus with diabetic chronic kidney disease: Secondary | ICD-10-CM | POA: Diagnosis present

## 2016-04-13 LAB — CBC
HEMATOCRIT: 33.2 % — AB (ref 39.0–52.0)
HEMOGLOBIN: 11 g/dL — AB (ref 13.0–17.0)
MCH: 28.4 pg (ref 26.0–34.0)
MCHC: 33.1 g/dL (ref 30.0–36.0)
MCV: 85.8 fL (ref 78.0–100.0)
Platelets: 489 10*3/uL — ABNORMAL HIGH (ref 150–400)
RBC: 3.87 MIL/uL — ABNORMAL LOW (ref 4.22–5.81)
RDW: 13.5 % (ref 11.5–15.5)
WBC: 25 10*3/uL — ABNORMAL HIGH (ref 4.0–10.5)

## 2016-04-13 LAB — BASIC METABOLIC PANEL
Anion gap: 8 (ref 5–15)
Anion gap: 10 (ref 5–15)
Anion gap: 12 (ref 5–15)
BUN: 12 mg/dL (ref 6–20)
BUN: 12 mg/dL (ref 6–20)
BUN: 28 mg/dL — AB (ref 6–20)
CHLORIDE: 100 mmol/L — AB (ref 101–111)
CHLORIDE: 100 mmol/L — AB (ref 101–111)
CHLORIDE: 106 mmol/L (ref 101–111)
CO2: 25 mmol/L (ref 22–32)
CO2: 22 mmol/L (ref 22–32)
CO2: 24 mmol/L (ref 22–32)
CREATININE: 1.59 mg/dL — AB (ref 0.61–1.24)
Calcium: 9 mg/dL (ref 8.9–10.3)
Calcium: 8.9 mg/dL (ref 8.9–10.3)
Calcium: 8.8 mg/dL — ABNORMAL LOW (ref 8.9–10.3)
Creatinine, Ser: 1.33 mg/dL — ABNORMAL HIGH (ref 0.61–1.24)
Creatinine, Ser: 1.3 mg/dL — ABNORMAL HIGH (ref 0.61–1.24)
GFR calc Af Amer: 57 mL/min — ABNORMAL LOW (ref >60)
GFR calc Af Amer: >60 mL/min (ref >60)
GFR calc Af Amer: >60 mL/min (ref >60)
GFR calc non Af Amer: 49 mL/min — ABNORMAL LOW (ref >60)
GFR calc non Af Amer: >60 mL/min (ref >60)
GFR calc non Af Amer: >60 mL/min (ref >60)
GLUCOSE: 117 mg/dL — AB (ref 65–99)
Glucose, Bld: 236 mg/dL — ABNORMAL HIGH (ref 65–99)
Glucose, Bld: 223 mg/dL — ABNORMAL HIGH (ref 65–99)
POTASSIUM: 3.9 mmol/L (ref 3.5–5.1)
POTASSIUM: 3.9 mmol/L (ref 3.5–5.1)
Potassium: 4.3 mmol/L (ref 3.5–5.1)
SODIUM: 134 mmol/L — AB (ref 135–145)
SODIUM: 135 mmol/L (ref 135–145)
Sodium: 138 mmol/L (ref 135–145)

## 2016-04-13 LAB — CBG MONITORING, ED
GLUCOSE-CAPILLARY: 109 mg/dL — AB (ref 65–99)
GLUCOSE-CAPILLARY: 216 mg/dL — AB (ref 65–99)
GLUCOSE-CAPILLARY: 251 mg/dL — AB (ref 65–99)
Glucose-Capillary: 102 mg/dL — ABNORMAL HIGH (ref 65–99)
Glucose-Capillary: 148 mg/dL — ABNORMAL HIGH (ref 65–99)
Glucose-Capillary: 262 mg/dL — ABNORMAL HIGH (ref 65–99)
Glucose-Capillary: 278 mg/dL — ABNORMAL HIGH (ref 65–99)
Glucose-Capillary: 292 mg/dL — ABNORMAL HIGH (ref 65–99)

## 2016-04-13 LAB — COMPREHENSIVE METABOLIC PANEL
ALBUMIN: 3.2 g/dL — AB (ref 3.5–5.0)
ALK PHOS: 82 U/L (ref 38–126)
ALT: 18 U/L (ref 17–63)
ANION GAP: 11 (ref 5–15)
AST: 17 U/L (ref 15–41)
BILIRUBIN TOTAL: 0.6 mg/dL (ref 0.3–1.2)
BUN: 23 mg/dL — ABNORMAL HIGH (ref 6–20)
CALCIUM: 9 mg/dL (ref 8.9–10.3)
CO2: 21 mmol/L — ABNORMAL LOW (ref 22–32)
Chloride: 107 mmol/L (ref 101–111)
Creatinine, Ser: 1.52 mg/dL — ABNORMAL HIGH (ref 0.61–1.24)
GFR calc Af Amer: >60 mL/min (ref >60)
GFR, EST NON AFRICAN AMERICAN: 52 mL/min — AB (ref >60)
Glucose, Bld: 161 mg/dL — ABNORMAL HIGH (ref 65–99)
POTASSIUM: 4.2 mmol/L (ref 3.5–5.1)
Sodium: 139 mmol/L (ref 135–145)
TOTAL PROTEIN: 6.8 g/dL (ref 6.5–8.1)

## 2016-04-13 LAB — GLUCOSE, CAPILLARY
GLUCOSE-CAPILLARY: 224 mg/dL — AB (ref 65–99)
Glucose-Capillary: 224 mg/dL — ABNORMAL HIGH (ref 65–99)

## 2016-04-13 LAB — ETHANOL: Alcohol, Ethyl (B): <5 mg/dL (ref <5)

## 2016-04-13 MED ORDER — ONDANSETRON HCL 4 MG PO TABS: 4.0000 mg | ORAL_TABLET | Freq: Four times a day (QID) | ORAL | Status: DC | PRN

## 2016-04-13 MED ORDER — ACETAMINOPHEN 325 MG PO TABS: 650.0000 mg | ORAL_TABLET | Freq: Four times a day (QID) | ORAL | Status: DC | PRN

## 2016-04-13 MED ORDER — SODIUM CHLORIDE 0.9 % IV SOLN: INTRAVENOUS | Status: DC

## 2016-04-13 MED ORDER — INSULIN GLARGINE 100 UNIT/ML ~~LOC~~ SOLN
20.0000 [IU] | Freq: Every day | SUBCUTANEOUS | Status: DC
  Administered 2016-04-13 – 2016-04-14 (×2): 20 [IU] via SUBCUTANEOUS
  Filled 2016-04-13 (×2): qty 0.2

## 2016-04-13 MED ORDER — ALUM & MAG HYDROXIDE-SIMETH 200-200-20 MG/5ML PO SUSP
30.0000 mL | ORAL | Status: DC | PRN
  Administered 2016-04-13 – 2016-04-16 (×2): 30 mL via ORAL
  Filled 2016-04-13 (×2): qty 30

## 2016-04-13 MED ORDER — HEPARIN SODIUM (PORCINE) 5000 UNIT/ML IJ SOLN
5000.0000 [IU] | Freq: Three times a day (TID) | INTRAMUSCULAR | Status: DC
  Administered 2016-04-13 – 2016-04-16 (×9): 5000 [IU] via SUBCUTANEOUS
  Filled 2016-04-13 (×13): qty 1

## 2016-04-13 MED ORDER — PANTOPRAZOLE SODIUM 40 MG PO TBEC
40.0000 mg | DELAYED_RELEASE_TABLET | Freq: Two times a day (BID) | ORAL | Status: DC
  Administered 2016-04-13 – 2016-04-17 (×10): 40 mg via ORAL
  Filled 2016-04-13 (×10): qty 1

## 2016-04-13 MED ORDER — ENSURE ENLIVE PO LIQD
237.0000 mL | Freq: Two times a day (BID) | ORAL | Status: DC
  Administered 2016-04-14: 237 mL via ORAL

## 2016-04-13 MED ORDER — COLLAGENASE 250 UNIT/GM EX OINT
TOPICAL_OINTMENT | Freq: Every day | CUTANEOUS | Status: DC
  Administered 2016-04-13 – 2016-04-17 (×5): via TOPICAL
  Filled 2016-04-13 (×2): qty 30

## 2016-04-13 MED ORDER — SENNOSIDES-DOCUSATE SODIUM 8.6-50 MG PO TABS
1.0000 | ORAL_TABLET | Freq: Every day | ORAL | Status: DC
  Administered 2016-04-13: 1 via ORAL
  Filled 2016-04-13: qty 1

## 2016-04-13 MED ORDER — HEPARIN SODIUM (PORCINE) 5000 UNIT/ML IJ SOLN
5000.0000 [IU] | Freq: Three times a day (TID) | INTRAMUSCULAR | Status: DC
  Filled 2016-04-13: qty 1

## 2016-04-13 MED ORDER — INSULIN ASPART 100 UNIT/ML ~~LOC~~ SOLN
0.0000 [IU] | Freq: Three times a day (TID) | SUBCUTANEOUS | Status: DC
Start: 2016-04-13 — End: 2016-04-14
  Administered 2016-04-13: 5 [IU] via SUBCUTANEOUS
  Administered 2016-04-13: 3 [IU] via SUBCUTANEOUS
  Administered 2016-04-13: 5 [IU] via SUBCUTANEOUS
  Filled 2016-04-13 (×2): qty 1

## 2016-04-13 MED ORDER — DICLOFENAC SODIUM 1 % TD GEL
4.0000 g | Freq: Every day | TRANSDERMAL | Status: DC | PRN
  Administered 2016-04-14: 4 g via TOPICAL
  Filled 2016-04-13: qty 100

## 2016-04-13 MED ORDER — AMLODIPINE BESYLATE 10 MG PO TABS
10.0000 mg | ORAL_TABLET | Freq: Every day | ORAL | Status: DC
  Administered 2016-04-13 – 2016-04-17 (×5): 10 mg via ORAL
  Filled 2016-04-13 (×3): qty 1
  Filled 2016-04-13: qty 2
  Filled 2016-04-13: qty 1

## 2016-04-13 MED ORDER — ONDANSETRON HCL 4 MG/2ML IJ SOLN
4.0000 mg | Freq: Four times a day (QID) | INTRAMUSCULAR | Status: DC | PRN
  Administered 2016-04-13: 4 mg via INTRAVENOUS
  Filled 2016-04-13: qty 2

## 2016-04-13 MED ORDER — METOCLOPRAMIDE HCL 10 MG PO TABS
10.0000 mg | ORAL_TABLET | Freq: Three times a day (TID) | ORAL | Status: DC
  Administered 2016-04-13 – 2016-04-15 (×10): 10 mg via ORAL
  Filled 2016-04-13 (×10): qty 1

## 2016-04-13 MED ORDER — ATORVASTATIN CALCIUM 40 MG PO TABS
40.0000 mg | ORAL_TABLET | Freq: Every day | ORAL | Status: DC
  Administered 2016-04-13 – 2016-04-17 (×5): 40 mg via ORAL
  Filled 2016-04-13 (×5): qty 1

## 2016-04-13 MED ORDER — SODIUM CHLORIDE 0.9% FLUSH
3.0000 mL | Freq: Two times a day (BID) | INTRAVENOUS | Status: DC
  Administered 2016-04-13 – 2016-04-17 (×9): 3 mL via INTRAVENOUS

## 2016-04-13 MED ORDER — LISINOPRIL 10 MG PO TABS
10.0000 mg | ORAL_TABLET | Freq: Every day | ORAL | Status: DC
  Administered 2016-04-13: 10 mg via ORAL
  Filled 2016-04-13: qty 1

## 2016-04-13 MED ORDER — FLUOXETINE HCL 20 MG PO CAPS
20.0000 mg | ORAL_CAPSULE | Freq: Every day | ORAL | Status: DC
  Administered 2016-04-13 – 2016-04-17 (×5): 20 mg via ORAL
  Filled 2016-04-13 (×5): qty 1

## 2016-04-13 MED ORDER — ACETAMINOPHEN 650 MG RE SUPP
650.0000 mg | Freq: Four times a day (QID) | RECTAL | Status: DC | PRN
Start: 2016-04-13 — End: 2016-04-17

## 2016-04-13 NOTE — Consult Note (Addendum)
[facility] Nurse wound consult note Reason for Consult: Consult requested for right stump, surgery performed in 2015, according to the EMR.  X-ray results indicate:  "Cellulitis about the patient's stump eccentric posteriorly and medially with a small subcutaneous abscess as described above. Negative for osteomyelitis." Wound type: 2 areas of eschar; 1X1.2 and 3X1 to bottom of stump.   Wound bed: No odor, small amt tan drainage, slight amt fluctuance when probed with a swab Periwound: Intact skin surrounding Dressing procedure/placement/frequency: Santyl ointment for chemical debridement of nonviable tissue.  Pt should follow-up with his ortho surgeon after discharge.  Unable to discuss plan of care with patient since he remained asleep throughout the entire assessment. Please re-consult if further assistance is needed.  Thank-you,  Julien Girt MSN, [place], [place], [place], [place]

## 2016-04-13 NOTE — ED Notes (Signed)
Insulin increased to 5.9 units per hr per glucose stabilizer. Will dc insulin drip at 0440 per vo from admitting MD

## 2016-04-13 NOTE — ED Notes (Signed)
Provided patient with [place] sandwich, tolerating well at this time.

## 2016-04-13 NOTE — ED Notes (Signed)
Ordered carb mod lunch tray

## 2016-04-13 NOTE — ED Notes (Signed)
Wound RN at bedside. 

## 2016-04-13 NOTE — Care Management Note (Signed)
Case Management Note  Patient Details  Name: [name] MRN: [id number] Date of Birth: [birth year]  Subjective/Objective:                  From home with spouse. /50 yo male presents to the emergency department with weakness and elevated blood sugar.  Patient states he is also concerned that his stump has an infection due to the fact that he has a wound that is draining in that area.  Action/Plan: Follow for disposition needs. / Admit to OBSERVATION; anticipate discharge home with home health.   Expected Discharge Date:  04/15/16               Expected Discharge Plan:  [place]  In-House Referral:  NA  Discharge planning Services  CM Consult  Post Acute Care Choice:  NA Choice offered to:  NA  DME Arranged:  N/A DME Agency:  NA  HH Arranged:  NA HH Agency:  NA  Status of Service:  In process, will continue to follow  If discussed at Long Length of Stay Meetings, dates discussed:    Additional Comments:  Fuller Mandril, RN 04/13/2016, 11:48 AM
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
## 2016-04-14 LAB — BASIC METABOLIC PANEL
ANION GAP: 12 (ref 5–15)
BUN: 10 mg/dL (ref 6–20)
CHLORIDE: 97 mmol/L — AB (ref 101–111)
CO2: 24 mmol/L (ref 22–32)
Calcium: 8.7 mg/dL — ABNORMAL LOW (ref 8.9–10.3)
Creatinine, Ser: 1.29 mg/dL — ABNORMAL HIGH (ref 0.61–1.24)
Glucose, Bld: 315 mg/dL — ABNORMAL HIGH (ref 65–99)
POTASSIUM: 4.1 mmol/L (ref 3.5–5.1)
SODIUM: 133 mmol/L — AB (ref 135–145)

## 2016-04-14 LAB — CBC
HCT: 31.7 % — ABNORMAL LOW (ref 39.0–52.0)
Hemoglobin: 10.3 g/dL — ABNORMAL LOW (ref 13.0–17.0)
MCH: 28 pg (ref 26.0–34.0)
MCHC: 32.5 g/dL (ref 30.0–36.0)
MCV: 86.1 fL (ref 78.0–100.0)
PLATELETS: 460 10*3/uL — AB (ref 150–400)
RBC: 3.68 MIL/uL — ABNORMAL LOW (ref 4.22–5.81)
RDW: 13.3 % (ref 11.5–15.5)
WBC: 13.8 10*3/uL — AB (ref 4.0–10.5)

## 2016-04-14 LAB — GLUCOSE, CAPILLARY
GLUCOSE-CAPILLARY: 298 mg/dL — AB (ref 65–99)
GLUCOSE-CAPILLARY: 507 mg/dL — AB (ref 65–99)
GLUCOSE-CAPILLARY: 274 mg/dL — AB (ref 65–99)
GLUCOSE-CAPILLARY: 316 mg/dL — AB (ref 65–99)

## 2016-04-14 LAB — PROCALCITONIN

## 2016-04-14 MED ORDER — SUCRALFATE 1 GM/10ML PO SUSP
1.0000 g | Freq: Four times a day (QID) | ORAL | Status: DC
  Administered 2016-04-14 – 2016-04-17 (×14): 1 g via ORAL
  Filled 2016-04-14 (×14): qty 10

## 2016-04-14 MED ORDER — SENNOSIDES-DOCUSATE SODIUM 8.6-50 MG PO TABS
1.0000 | ORAL_TABLET | Freq: Two times a day (BID) | ORAL | Status: DC
  Administered 2016-04-14 – 2016-04-17 (×6): 1 via ORAL
  Filled 2016-04-14 (×6): qty 1

## 2016-04-14 MED ORDER — LISINOPRIL 20 MG PO TABS
20.0000 mg | ORAL_TABLET | Freq: Every day | ORAL | Status: DC
  Administered 2016-04-14 – 2016-04-17 (×4): 20 mg via ORAL
  Filled 2016-04-14 (×4): qty 1

## 2016-04-14 MED ORDER — GLUCERNA SHAKE PO LIQD
237.0000 mL | Freq: Three times a day (TID) | ORAL | Status: DC
  Administered 2016-04-14 – 2016-04-17 (×9): 237 mL via ORAL
  Filled 2016-04-14 (×3): qty 237

## 2016-04-14 MED ORDER — MENTHOL 3 MG MT LOZG: 1.0000 | LOZENGE | OROMUCOSAL | Status: DC | PRN

## 2016-04-14 MED ORDER — MELATONIN 3 MG PO TABS
3.0000 mg | ORAL_TABLET | Freq: Every evening | ORAL | Status: DC | PRN
  Administered 2016-04-14 – 2016-04-16 (×4): 3 mg via ORAL
  Filled 2016-04-14 (×6): qty 1

## 2016-04-14 MED ORDER — INSULIN ASPART 100 UNIT/ML ~~LOC~~ SOLN
0.0000 [IU] | Freq: Three times a day (TID) | SUBCUTANEOUS | Status: DC
  Administered 2016-04-14 (×2): 8 [IU] via SUBCUTANEOUS
  Administered 2016-04-14: 15 [IU] via SUBCUTANEOUS
  Administered 2016-04-15: 11 [IU] via SUBCUTANEOUS
  Administered 2016-04-15: 8 [IU] via SUBCUTANEOUS
  Administered 2016-04-15: 11 [IU] via SUBCUTANEOUS
  Administered 2016-04-16: 5 [IU] via SUBCUTANEOUS
  Administered 2016-04-16: 8 [IU] via SUBCUTANEOUS
  Administered 2016-04-16: 11 [IU] via SUBCUTANEOUS

## 2016-04-14 MED ORDER — LIDOCAINE VISCOUS 2 % MT SOLN
15.0000 mL | Freq: Four times a day (QID) | OROMUCOSAL | Status: DC | PRN
  Filled 2016-04-14: qty 15

## 2016-04-14 MED ORDER — INSULIN ASPART 100 UNIT/ML ~~LOC~~ SOLN
0.0000 [IU] | Freq: Every day | SUBCUTANEOUS | Status: DC
  Administered 2016-04-14: 4 [IU] via SUBCUTANEOUS
  Administered 2016-04-15: 3 [IU] via SUBCUTANEOUS

## 2016-04-14 MED ORDER — LIDOCAINE VISCOUS 2 % MT SOLN
15.0000 mL | Freq: Once | OROMUCOSAL | Status: AC
  Administered 2016-04-14: 15 mL via OROMUCOSAL
  Filled 2016-04-14 (×3): qty 15

## 2016-04-14 MED ORDER — INSULIN GLARGINE 100 UNIT/ML ~~LOC~~ SOLN
30.0000 [IU] | Freq: Every day | SUBCUTANEOUS | Status: DC
  Administered 2016-04-15: 30 [IU] via SUBCUTANEOUS
  Filled 2016-04-14: qty 0.3

## 2016-04-14 MED ORDER — ADULT MULTIVITAMIN W/MINERALS CH
1.0000 | ORAL_TABLET | Freq: Every day | ORAL | Status: DC
  Administered 2016-04-14 – 2016-04-17 (×4): 1 via ORAL
  Filled 2016-04-14 (×4): qty 1

## 2016-04-14 MED ORDER — INSULIN ASPART 100 UNIT/ML ~~LOC~~ SOLN
7.0000 [IU] | Freq: Once | SUBCUTANEOUS | Status: AC
  Administered 2016-04-14: 7 [IU] via SUBCUTANEOUS

## 2016-04-14 NOTE — Progress Notes (Signed)
  Date: 04/14/2016  Patient name: [name]  Medical record number: [id number]  Date of birth: 11/18/1965   I have personally seen and evaluated [name] and the plan of care was discussed with the house staff. Please see Dr. Rober Minion note for complete details. I concur with her findings.  Sid Falcon, MD 04/14/2016, 4:00 PM

## 2016-04-14 NOTE — Care Management Note (Addendum)
Case Management Note  Patient Details  Name: [name] MRN: [id number] Date of Birth: May 05, 1965  Subjective/Objective:  Pt presented for weakness and increased blood sugar. Hx Diabetes mellitus type 2- uncontrolled. Pt is S/P bilateral BKA- has prosthesis and a wheelchair. Pt was previously married and now is living with a friend and he rents a room from the friend. Pt has contacted the housing authority in regards to a place to stay. Pt had concerns in regards to ALF- CM did make CSW aware. Pt states he has transportation and that his medications are delivered to him by [facility]. Pt has Medicaid- medications are affordable. Pt is active with [facility]Marland Kitchen                Action/Plan: CM will continue to monitor for additional needs.   Expected Discharge Date:  04/15/16               Expected Discharge Plan:  [place]  In-House Referral:  NA, Clinical Social Work (Pt has questions in regards to ALF- CM did speak with CSW in regards)  Discharge planning Services  CM Consult  Post Acute Care Choice:    Choice offered to:     DME Arranged:    DME Agency:  NA  HH Arranged:    [place] Agency:     Status of Service:  In process, will continue to follow  If discussed at Long Length of Stay Meetings, dates discussed:  04-18-16  Additional Comments: 1534 04-17-16 Jacqlyn Krauss, RN,BSN [phone] Plan for d/c to [facility] 04-18-15. CSW assisting with disposition needs.    1243 04-17-16 Jacqlyn Krauss, RN, BSN [phone] CM did receive referral for Medication Assistance: Pt has medicaid and pt should have a co pay of no more than $3.00. Unable to assist pt with medications at d/c. PT recommendations for SNF-CSW did fax the patient out. Will continue to monitor.   Bethena Roys, RN 04/14/2016, 3:28 PM

## 2016-04-14 NOTE — Progress Notes (Signed)
Initial Nutrition Assessment  DOCUMENTATION CODES:   Not applicable  INTERVENTION:    Glucerna Shake po TID, each supplement provides 220 kcal and 10 grams of protein  MVI daily  NUTRITION DIAGNOSIS:   Malnutrition related to chronic illness as evidenced by per patient report.  GOAL:   Patient will meet greater than or equal to 90% of their needs  MONITOR:   PO intake, Supplement acceptance, Labs  REASON FOR ASSESSMENT:   Malnutrition Screening Tool    ASSESSMENT:   51 yo man with PMH of ETOH related pancreatitis, DM2, bilateral BKA, gastroparesis, CKD2, HTN, left nephrectomy who presented with nausea, vomiting and abdominal pain and was found to be in DKA. S/P L knee xray and MRI in the ED which showed cellulitis and now osteomyelitis of L knee stump.  Patient reports recent poor appetite and poor intake for a few months. He now c/o burning stomach pain and sore throat. He agreed to try Glucerna Shakes to maximize protein and calorie intake to promote healing. Patient with weight loss of ~15% weight loss within the past 6 months. From discussion with patient about usual intake, suspect intake has been meeting <75% of estimated energy requirement for > 1 month. Nutrition focused physical exam completed.  No muscle or subcutaneous fat depletion noticed.  Diet Order:  Diet Carb Modified Fluid consistency: Thin; Room service appropriate? Yes  Skin:  Reviewed, no issues  Last BM:  1/2  Height:   Ht Readings from Last 1 Encounters:  04/14/16 5\' 8"  (1.727 m)    Weight:   Wt Readings from Last 1 Encounters:  04/14/16 158 lb 14.4 oz (72.1 kg)    Ideal Body Weight:  60.9 kg  BMI:  27.6  Estimated Nutritional Needs:   Kcal:  1900-2100  Protein:  90-110 gm  Fluid:  2 L  EDUCATION NEEDS:   No education needs identified at this time  Molli Barrows, [place], [place], [place] Pager [phone] After Hours Pager [phone]

## 2016-04-14 NOTE — Progress Notes (Signed)
Inpatient Diabetes Program Recommendations  AACE/ADA: New Consensus Statement on Inpatient Glycemic Control (2015)  Target Ranges:  Prepandial:   less than 140 mg/dL      Peak postprandial:   less than 180 mg/dL (1-2 hours)      Critically ill patients:  140 - 180 mg/dL   Lab Results  Component Value Date   GLUCAP 507 (HH) 04/14/2016   HGBA1C 13.7 (H) 11/10/2015   Results for KACIE, KRISTIANSEN (MRN [id number]) as of 04/14/2016 11:47  Ref. Range 04/13/2016 11:39 04/13/2016 16:40 04/13/2016 21:41 04/14/2016 07:51 04/14/2016 11:11  Glucose-Capillary Latest Ref Range: 65 - 99 mg/dL 251 (H) 224 (H) 224 (H) 298 (H) 507 (HH)   Review of Glycemic Control  Diabetes history: DM, Creatinine=1.29, Positive for cocaine and THC this admission Outpatient Diabetes medications: Lantus 40 units QHS, Novolog 0-15 units TIDAC, Metformin 1000 mg daily Current orders for Inpatient glycemic control: Novolog 0-15 units TIDAC and 0-5 units QHS, Lantus 30 units daily starting tomorrow 04/15/16  Inpatient Diabetes Program Recommendations:   Agree with current plan of care.  Thank you,  Windy Carina, RN, MSN Diabetes Coordinator Inpatient Diabetes Program [phone] (Team Pager)

## 2016-04-14 NOTE — Progress Notes (Signed)
   04/14/16 1115  Clinical Encounter Type  Visited With Patient  Visit Type Initial  Referral From Patient  Consult/Referral To Chaplain  Recommendations (fiollow up)  Spiritual Encounters  Spiritual Needs Sacred text;Prayer;Emotional  Stress Factors  Patient Stress Factors Family relationships;Financial concerns;Health changes;Lack of caregivers  Pt. Desired prayer and consultation, just wanted to talk.  Pt. 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  Seeking a deeper faith and personal  Spiritual relationship, loving and belonging.  Chaplain rendered a ministry of presence, emotional and spiritual support,  Sacred text reading, shared (scripture) with patient in prayer.  With pt. Permission, followup.   Chaplain Verdon Ferrante A. Skip Litke MA-PC , BA REL/PHIL,  CPE  III  [phone]

## 2016-04-14 NOTE — Progress Notes (Addendum)
Subjective: [name] was seen and evaluated today at bedside. He reports he continues to have burning epigastric abdominal pain. He also endorses a newly reported symptom of sore throat which is presently limiting his oral intake. He wonders if this sore throat could be due to his crack cocaine usage. No more vomiting. Has not yet had a bowel movement and reports he is not passing gas as he has not eaten.   Objective:  Vital signs in last 24 hours: Vitals:   04/13/16 1542 04/13/16 2140 04/14/16 0505 04/14/16 0543  BP: (!) 187/98 (!) 184/96 (!) 181/94 (!) 149/75  Pulse:  91 91 88  Resp:  17 (!) 23 17  Temp: 98.7 F (37.1 C) 98.5 F (36.9 C) 98.6 F (37 C)   TempSrc: Oral Oral Oral   SpO2:  98% 99% 97%  Weight:   158 lb 14.4 oz (72.1 kg)    General: African [place] male, In no acute distress. Resting comfortably in bed. HENT: EOMI. No conjunctival injection, icterus. Oropharynx clear, without exudates nor erythema. No visible tonsillar enlargement.  Cardiovascular: Regular rate and rhythm. No murmur or rub appreciated. Pulmonary: CTA BL. Non-labored breathing.  Abdomen: Soft, non-distended. Mild epigastric discomfort. No guarding or rigidity. +bowel sounds.  Extremities: BL BKA. Right stump with intact dressings.  Skin: Warm, dry. No cyanosis.  Psych: Mood normal and affect was mood congruent. Responds to questions appropriately.   Assessment/Plan:  Principal Problem:   Diabetic ketoacidosis without coma ([place]) Active Problems:   Diabetes mellitus type 2, uncontrolled, with complications (HCC)   Polysubstance abuse   S/P bilateral BKA (below knee amputation) ([place])   DKA (diabetic ketoacidoses) (HCC)   Cellulitis of leg  DKA Resolved. Likely initiating factors medication non-compliance + cellulitis of right AKA stump. Gap closed during first night of hospitalization and repeat BMETs do not show opening. CBG showing avg of 250. Most recent HbA1c 11/2015 13.7%.  -BMET daily to  ensure gap not re-opening. If it opens again, restart glucostabilizer and transfer to step-down -Lantus 20 units QHS + SS-M - Have increased SS from sensitive to moderate -CBG QID -Carb modified diet  Type 2 Diabetes, complicated by Neuropathy and Gastroparesis Usually on Lantus 40 units and metformin 2000 mg daily. Takes Gabapentin and Reglan for complications of neuropathy and gastroparesis.  R AKA Stump Cellulitis Seen by wound care who recommend enzymatic debridement and follow up with ortho after dc. Leukocytosis improved significantly, presently 13.6, down from 25 yesterday.  -Continue Zosyn. D/c Vancomycin - has not had +MRSA in chart -Prior cultures from several years ago show E.Coli -- will cont Zosyn at this time and will transition to oral tomorrow. Keflex tom, has coverage against E.coli + multiple other organisms which could be responsible for current infection. -Enzymatic debridement per wound care. Denies any pain from site. Covered in clean dry dressings.   HTN Increased Lisinopril to his home dose of 20 mg as pressures have been high. Continuing Amlodipine 10 mg   GERD Pt endorses burning abdominal discomfort. Was started on Protonix 40 mg BID yesterday however has not noticed symptom relief yet. Will try trial of carafate.  -Carafate QID  -Cont protonix  Sore Throat, Hx of Crack Cocaine use Pt reports a sore throat and wonders if this could be related to crack-cocaine use. PE does not show infectious cause. Counseled on the importance of crack cessation and reports he is trying to go to a 30-day rehab program after discharge. Appears motivated and applauded/encouraged  patient on his decision. Will try medications for sore throat in hopes that this will allow pt to eat.  -Viscous lidocaine -Cepacol  -Pt meeting with CM to learn about rehab programs in area.   Depression- Continue Prozac  Dispo: Anticipated discharge in approximately 1-2 day(s).   Wesley Deltoro,  DO 04/14/2016, 7:26 AM Pager: [phone]

## 2016-04-15 LAB — CBC
HEMATOCRIT: 31.4 % — AB (ref 39.0–52.0)
Hemoglobin: 10.3 g/dL — ABNORMAL LOW (ref 13.0–17.0)
MCH: 28.3 pg (ref 26.0–34.0)
MCHC: 32.8 g/dL (ref 30.0–36.0)
MCV: 86.3 fL (ref 78.0–100.0)
PLATELETS: 399 10*3/uL (ref 150–400)
RBC: 3.64 MIL/uL — ABNORMAL LOW (ref 4.22–5.81)
RDW: 13.4 % (ref 11.5–15.5)
WBC: 7.6 10*3/uL (ref 4.0–10.5)

## 2016-04-15 LAB — GLUCOSE, CAPILLARY
GLUCOSE-CAPILLARY: 280 mg/dL — AB (ref 65–99)
Glucose-Capillary: 334 mg/dL — ABNORMAL HIGH (ref 65–99)
Glucose-Capillary: 343 mg/dL — ABNORMAL HIGH (ref 65–99)
Glucose-Capillary: 298 mg/dL — ABNORMAL HIGH (ref 65–99)

## 2016-04-15 LAB — BASIC METABOLIC PANEL
Anion gap: 10 (ref 5–15)
BUN: 12 mg/dL (ref 6–20)
CHLORIDE: 96 mmol/L — AB (ref 101–111)
CO2: 27 mmol/L (ref 22–32)
CREATININE: 1.31 mg/dL — AB (ref 0.61–1.24)
Calcium: 8.7 mg/dL — ABNORMAL LOW (ref 8.9–10.3)
GFR calc Af Amer: >60 mL/min (ref >60)
GFR calc non Af Amer: >60 mL/min (ref >60)
GLUCOSE: 302 mg/dL — AB (ref 65–99)
POTASSIUM: 4 mmol/L (ref 3.5–5.1)
SODIUM: 133 mmol/L — AB (ref 135–145)

## 2016-04-15 MED ORDER — INSULIN GLARGINE 100 UNIT/ML ~~LOC~~ SOLN
10.0000 [IU] | Freq: Once | SUBCUTANEOUS | Status: AC
  Administered 2016-04-15: 10 [IU] via SUBCUTANEOUS
  Filled 2016-04-15: qty 0.1

## 2016-04-15 MED ORDER — CEPHALEXIN 500 MG PO CAPS
500.0000 mg | ORAL_CAPSULE | Freq: Two times a day (BID) | ORAL | Status: DC
  Administered 2016-04-15 – 2016-04-17 (×4): 500 mg via ORAL
  Filled 2016-04-15 (×4): qty 1

## 2016-04-15 MED ORDER — INSULIN GLARGINE 100 UNIT/ML ~~LOC~~ SOLN
40.0000 [IU] | Freq: Every day | SUBCUTANEOUS | Status: DC
  Administered 2016-04-16: 40 [IU] via SUBCUTANEOUS
  Filled 2016-04-15: qty 0.4

## 2016-04-15 NOTE — Plan of Care (Signed)
Problem: Health Behavior: Goal: Ability to manage health-related needs will improve Outcome: Progressing Shows skill in Self Injection. Verbalizes understanding of insulin regimen

## 2016-04-15 NOTE — Progress Notes (Signed)
Subjective: [name] was seen and evaluated today at bedside. He reports he still continues to have upper abdominal and retrosternal chest discomfort, described as burning. Found some improvement with Carafate. Patient reports he still has yet to eat a solid meal and wishes to stay in hosp until sure he can tolerate it. He also complains of teeth grinding which has been going on for several months.   Objective:  Vital signs in last 24 hours: Vitals:   04/14/16 2021 04/15/16 0018 04/15/16 0426 04/15/16 0823  BP: (!) 152/93 (!) 180/95 (!) 149/92 (!) 160/85  Pulse: 87 82 99 90  Resp: 17 19 19    Temp: 98.7 F (37.1 C) 99 F (37.2 C) 98.9 F (37.2 C) 98.3 F (36.8 C)  TempSrc: Oral Oral Oral Oral  SpO2: 100% 98% 99% 97%  Weight:   162 lb 6.4 oz (73.7 kg)   Height:       General: African-American male, resting in bed. Appears somewhat distressed.  HENT: PERRL. EOMI. No conjunctival injection, icterus or ptosis. Oropharynx clear, mucous membranes moist.  Cardiovascular: Regular rate and rhythm. No murmur or rub appreciated. Pulmonary: CTA BL. Unlabored breathing.  Abdomen: Soft and non-distended. Some epigastric discomfort. No guarding or rigidity. +bowel sounds.  Extremities: Bilateral BKA. Right stump with intact dressing Skin: Warm, dry. No cyanosis.  Neuro: Strength and sensation grossly intact.  Psych: Mood normal and affect was mood congruent. Responds to questions appropriately.   Assessment/Plan:  Principal Problem:   Diabetic ketoacidosis without coma ([place]) Active Problems:   Diabetes mellitus type 2, uncontrolled, with complications (HCC)   Polysubstance abuse   S/P bilateral BKA (below knee amputation) ([place])   DKA (diabetic ketoacidoses) (HCC)   Cellulitis of leg   Diabetic ketoacidosis, type 2 diabetes complicated by gastroparesis and neuropathy DKA resolved. Increased lantus last night to 30 units however still not having adequate glucose control. Will increase to  his home dose of 40 units tonight before bed. Patient still continues to complain of burning epigastric abdominal pain however this improved with Carafate and PPI therapy. Patient reports he is just now starting to take in solid food.Patient complains of trismus and medication review shows that he is on Reglan 10 mg. Reglan can have extrapyramidal side effects and we'll discontinue this medication at this time. Caution for return of gastroparesis symptoms. -Increased Lantus to 40 units -Sliding scale insulin -Discontinue Reglan for concern of extrapyramidal side effects. Will need to be cautious for return of symptoms -Carb modified diet, encourage by mouth intake to assess tolerance  Right BKA stump wound infection Currently being managed by wound care and clean dry dressings were placed this morning. Patient reports he has not served the wound and is unable to comment on its improvement. Is presently being enzymatically debrided. Patient's antibiotics were narrowed yesterday from IV vancomycin and Zosyn to Zosyn. Today we will transition patient to oral antibiotics with Keflex 500 mg twice a day for management of his cutaneous infection. Okay to transition back to Zosyn if patient unable to tolerate by mouth. -Oral Keflex -Wound care  GERD Still continues to complain of burning abdominal discomfort however this was improved by Carafate 4 times a day. He was started on Protonix 40 mg twice a day 2 days ago. We'll continue these therapies  Hypertension Pressures have improved since starting his home dose lisinopril 20 mg. We'll continue to monitor  Sore throat, current history of crack cocaine use -Working with case management for rehabilitation possibilities as  an outpatient  Dispo: Anticipated discharge once able to tolerate PO, likely tomorrow.   Rayni Nemitz, DO 04/15/2016, 11:08 AM Pager: [phone]

## 2016-04-15 NOTE — Progress Notes (Signed)
Medicine attending: I examined this patient today together with resident physician Dr. Einar Gip and I concur with her evaluation and management plan which we discussed together and will be detailed further in her daily progress note. Slowly improving but still having epigastric abdominal pain. Just starting back on solid food. He complains of trismus. Review of his medication list reveals he is on Reglan which can have extraparametal side effects. We will discontinue this. Anion gap has closed. Blood sugars remain over 300. Continue to adjust insulin.

## 2016-04-16 LAB — BASIC METABOLIC PANEL
Anion gap: 8 (ref 5–15)
BUN: 13 mg/dL (ref 6–20)
CHLORIDE: 98 mmol/L — AB (ref 101–111)
CO2: 30 mmol/L (ref 22–32)
CREATININE: 1.28 mg/dL — AB (ref 0.61–1.24)
Calcium: 9 mg/dL (ref 8.9–10.3)
GFR calc Af Amer: >60 mL/min (ref >60)
GFR calc non Af Amer: >60 mL/min (ref >60)
Glucose, Bld: 285 mg/dL — ABNORMAL HIGH (ref 65–99)
Potassium: 3.8 mmol/L (ref 3.5–5.1)
Sodium: 136 mmol/L (ref 135–145)

## 2016-04-16 LAB — GLUCOSE, CAPILLARY
Glucose-Capillary: 244 mg/dL — ABNORMAL HIGH (ref 65–99)
Glucose-Capillary: 296 mg/dL — ABNORMAL HIGH (ref 65–99)
Glucose-Capillary: 331 mg/dL — ABNORMAL HIGH (ref 65–99)
Glucose-Capillary: 237 mg/dL — ABNORMAL HIGH (ref 65–99)

## 2016-04-16 LAB — PROCALCITONIN: Procalcitonin: <0.1 ng/mL

## 2016-04-16 MED ORDER — INSULIN ASPART 100 UNIT/ML ~~LOC~~ SOLN
0.0000 [IU] | Freq: Every day | SUBCUTANEOUS | Status: DC
  Administered 2016-04-16: 2 [IU] via SUBCUTANEOUS

## 2016-04-16 MED ORDER — INSULIN GLARGINE 100 UNIT/ML ~~LOC~~ SOLN
48.0000 [IU] | Freq: Every day | SUBCUTANEOUS | Status: DC
  Administered 2016-04-17: 48 [IU] via SUBCUTANEOUS
  Filled 2016-04-16: qty 0.48

## 2016-04-16 MED ORDER — INSULIN ASPART 100 UNIT/ML ~~LOC~~ SOLN
0.0000 [IU] | Freq: Three times a day (TID) | SUBCUTANEOUS | Status: DC
  Administered 2016-04-17: 11 [IU] via SUBCUTANEOUS
  Administered 2016-04-17: 7 [IU] via SUBCUTANEOUS
  Administered 2016-04-17: 11 [IU] via SUBCUTANEOUS

## 2016-04-16 NOTE — Plan of Care (Signed)
Problem: Spiritual Needs Goal: Ability to function at adequate level Outcome: Progressing Assess discharge needs.   Problem: Coping: Goal: Ability to adjust to condition or change in health will improve Outcome: Progressing Verbalizes understanding of s/s of hypo and hyperglycemia

## 2016-04-16 NOTE — Progress Notes (Signed)
   Subjective:   No acute events overnight.  He says he still did not eat significant enough- and he was very upset and frustrated and tearful at the overall situation- He feels that the hospital has not helped him- He says that he wants to go to a drug rehabilitation program - inpatient. He knows that insurance will not pay for it. He then would like to go to a SNF. He has a very poor living situation- and he is very frustrated by it- says he has to change his houses every 2 months. He says that he is out of his insulin at home- per CM he has Medicaid so he will get medications for free.  CSW will help with arranging possible SNF placement.   Says carafate has been helping- has not taken the viscous lidocaine.   Objective:  Vital signs in last 24 hours: Vitals:   04/16/16 0410 04/16/16 0835 04/16/16 1200 04/16/16 1300  BP: (!) 143/80 (!) 154/75  112/61  Pulse: 89  88   Resp: 11  16   Temp: 98.7 F (37.1 C) 97.9 F (36.6 C)  98.2 F (36.8 C)  TempSrc: Oral Oral  Oral  SpO2: 99% 97% 95% 98%  Weight: 162 lb 6.4 oz (73.7 kg)     Height:       General: African-American male, resting in bed. Appears somewhat distressed.  Cardiovascular: Regular rate and rhythm. No murmur or rub appreciated. Pulmonary: CTA BL. Unlabored breathing.  Abdomen: Soft and non-distended. Some epigastric discomfort. No guarding or rigidity. +bowel sounds.  Extremities: Bilateral BKA. Right stump with intact dressing Skin: Warm, dry. No cyanosis.  Psych: tearful on exam today   Assessment/Plan:  Principal Problem:   Diabetic ketoacidosis without coma ([place]) Active Problems:   Diabetes mellitus type 2, uncontrolled, with complications (HCC)   Polysubstance abuse   S/P bilateral BKA (below knee amputation) ([place])   DKA (diabetic ketoacidoses) (HCC)   Cellulitis of leg   Diabetic ketoacidosis, type 2 diabetes complicated by gastroparesis and neuropathy: Resolved. Now on home dose of lantus 40 units daily.  Patient still continues to complain of burning epigastric abdominal pain however this improved with Carafate and PPI therapy. Patient reports he is just now starting to take in solid food. Reglan was stopped yesterday- he has not reported any gastroparesis- will monitor it.  -Lantus  40 units -Sliding scale insulin -Carb modified diet  Right BKA stump wound infection: currently on oral keflex. Wound care was consulted. Improved.  -Oral Keflex -Wound care  GERD -carafate -protonix -viscous lidocaine   Hypertension: lisinopril -continue lisinopril   Disposition: Pt is very interested in going to a drug rehabilitation program, and if that is not possible then go to SNF.  He was very tearful today.  Consulted social work     Dispo: Anticipated discharge tomorrow pending SNF  [name], MD 04/16/2016, 3:35 PM

## 2016-04-17 ENCOUNTER — Encounter: Payer: Self-pay | Admitting: Surgery

## 2016-04-17 DIAGNOSIS — E1310 Other specified diabetes mellitus with ketoacidosis without coma: Secondary | ICD-10-CM

## 2016-04-17 DIAGNOSIS — L0390 Cellulitis, unspecified: Secondary | ICD-10-CM

## 2016-04-17 DIAGNOSIS — Z89512 Acquired absence of left leg below knee: Secondary | ICD-10-CM

## 2016-04-17 DIAGNOSIS — Z89511 Acquired absence of right leg below knee: Secondary | ICD-10-CM

## 2016-04-17 LAB — BASIC METABOLIC PANEL
ANION GAP: 9 (ref 5–15)
BUN: 14 mg/dL (ref 6–20)
CHLORIDE: 98 mmol/L — AB (ref 101–111)
CO2: 29 mmol/L (ref 22–32)
Calcium: 8.8 mg/dL — ABNORMAL LOW (ref 8.9–10.3)
Creatinine, Ser: 1.27 mg/dL — ABNORMAL HIGH (ref 0.61–1.24)
GFR calc Af Amer: >60 mL/min (ref >60)
GFR calc non Af Amer: >60 mL/min (ref >60)
GLUCOSE: 275 mg/dL — AB (ref 65–99)
POTASSIUM: 3.9 mmol/L (ref 3.5–5.1)
SODIUM: 136 mmol/L (ref 135–145)

## 2016-04-17 LAB — CULTURE, BLOOD (ROUTINE X 2)
CULTURE: NO GROWTH
CULTURE: NO GROWTH

## 2016-04-17 LAB — GLUCOSE, CAPILLARY
GLUCOSE-CAPILLARY: 247 mg/dL — AB (ref 65–99)
Glucose-Capillary: 262 mg/dL — ABNORMAL HIGH (ref 65–99)
Glucose-Capillary: 278 mg/dL — ABNORMAL HIGH (ref 65–99)

## 2016-04-17 MED ORDER — CEPHALEXIN 500 MG PO CAPS: 500.0000 mg | ORAL_CAPSULE | Freq: Two times a day (BID) | ORAL | 0 refills | Status: AC

## 2016-04-17 MED ORDER — COLLAGENASE 250 UNIT/GM EX OINT: TOPICAL_OINTMENT | Freq: Every day | CUTANEOUS | 0 refills | Status: AC

## 2016-04-17 NOTE — Discharge Summary (Signed)
Name: [name] MRN: [id number] DOB: 12/01/1965 51 y.o. PCP: [name], MD  Date of Admission: 04/12/2016  9:38 AM Date of Discharge: 04/17/2016 Attending Physician: Sid Falcon, MD  Discharge Diagnosis: 1. Type 2 Diabetes presenting with Diabetic Ketoacidosis 2. Gastroparesis 3. Right leg cellulitis 4. Polysubstance abuse 5. GERD  Principal Problem:   Diabetic ketoacidosis without coma ([place]) Active Problems:   Diabetes mellitus type 2, uncontrolled, with complications (HCC)   Polysubstance abuse   S/P bilateral BKA (below knee amputation) ([place])   DKA (diabetic ketoacidoses) (HCC)   Cellulitis of leg   Discharge Medications: Allergies as of 04/17/2016   No Known Allergies     Medication List    STOP taking these medications   famotidine 20 MG tablet Commonly known as:  PEPCID   metoCLOPramide 10 MG tablet Commonly known as:  REGLAN   mupirocin cream 2 % Commonly known as:  BACTROBAN   sildenafil 20 MG tablet Commonly known as:  REVATIO     TAKE these medications   ACCU-CHEK FASTCLIX LANCETS Misc Check blood sugar before meals and bedtime. Dx code: E11.8   ACCU-CHEK NANO SMARTVIEW w/Device Kit Check blood sugar before meals and bedtime. Dx code: E11.8   amLODipine 10 MG tablet Commonly known as:  NORVASC Take 1 tablet (10 mg total) by mouth daily.   atorvastatin 40 MG tablet Commonly known as:  LIPITOR Take 1 tablet (40 mg total) by mouth daily at 6 PM.   cephALEXin 500 MG capsule Commonly known as:  KEFLEX Take 1 capsule (500 mg total) by mouth 2 (two) times daily.   collagenase ointment Commonly known as:  SANTYL Apply topically daily. Start taking on:  04/18/2016   ferrous sulfate 325 (65 FE) MG tablet Take 1 tablet (325 mg total) by mouth daily with breakfast.   FLUoxetine 20 MG capsule Commonly known as:  PROZAC Take 1 capsule (20 mg total) by mouth daily.   gabapentin 600 MG tablet Commonly known as:  NEURONTIN TAKE 1 TABLET BY  MOUTH THREE TIMES DAILY   glucose blood test strip Commonly known as:  ACCU-CHEK SMARTVIEW Check blood sugar before meals and bedtime. Dx code: E11.8   insulin glargine 100 UNIT/ML injection Commonly known as:  LANTUS Inject 0.4 mLs (40 Units total) into the skin at bedtime.   Insulin Pen Needle 29G X 12MM Misc Commonly known as:  EASY TOUCH PEN NEEDLES diag code E11.8. Insulin dependent. Use to inject insulin into the skin 1 time daily   Insulin Syringe-Needle U-100 31G X 5/16" 1 ML Misc Commonly known as:  EASY COMFORT INSULIN SYRINGE Use to inject insulin daily   lisinopril 20 MG tablet Commonly known as:  PRINIVIL,ZESTRIL Take 1 tablet (20 mg total) by mouth daily.   metFORMIN 500 MG 24 hr tablet Commonly known as:  GLUCOPHAGE XR Take 2 tablets (1,000 mg total) by mouth daily with breakfast.   NOVOLOG 100 UNIT/ML injection Generic drug:  insulin aspart Inject 0-15 Units as directed 3 (three) times daily with meals.   pantoprazole 40 MG tablet Commonly known as:  PROTONIX Take 1 tablet (40 mg total) by mouth 2 (two) times daily.   polyethylene glycol packet Commonly known as:  MIRALAX / GLYCOLAX Take 17 g by mouth daily. What changed:  when to take this  reasons to take this   senna-docusate 8.6-50 MG tablet Commonly known as:  Senokot-S Take 1 tablet by mouth daily.   sucralfate 1 GM/10ML suspension Commonly known  as:  CARAFATE Take 10 mLs (1 g total) by mouth 4 (four) times daily -  with meals and at bedtime.   VOLTAREN 1 % Gel Generic drug:  diclofenac sodium Apply 4 g topically 4 (four) times daily. What changed:  when to take this  reasons to take this       Disposition and follow-up:   Mr.Mehki B Mannina was discharged from [facility] in stable condition.  At the hospital follow up visit please address:  1.  Glycemic control. Please ensure that he is taking his medications as directed. Adjust insulin to keep CBG <200. Right  stump cellulitis: please ensure patient has completed his course of PO Keflex and that his wound is healing appropriately.   2.  Labs / imaging needed at time of follow-up: None.  3.  Pending labs/ test needing follow-up: None.  Follow-up appointments: [facility] Course by problem list: Principal Problem:   Diabetic ketoacidosis without coma ([place]) Active Problems:   Diabetes mellitus type 2, uncontrolled, with complications ([place])   Polysubstance abuse   S/P bilateral BKA (below knee amputation) ([place])   DKA (diabetic ketoacidoses) (HCC)   Cellulitis of leg   1. Type 2 Diabetes presenting with Diabetic Ketoacidosis This is a 51 year old male who presented 04/12/16 for evaluation of nausea, vomiting and abdominal pain. Was found to be in DKA with CBG 700 + and an anion gap metabolic acidosis. He was subsequently started on DKA protocol with insulin drip, NPO, and Q hourly CBGs. His gap closed times 2 over his first 24 hours of hospitalization. His nausea and vomiting quickly resolved however he continued to endorse burning abdominal pain during his hospitalization, described as heartburn. This was treated as below. Triggers for DKA were insulin non-compliance x3 days (reports someone threw his medications away) as well as a right stump cellulitis, as described below. Despite resuming his home lantus, his blood sugars remained in the   2. Gastroparesis due to long standing diabetes The patient has a history of gastroparesis and was taking Reglan for several months. During admission he reported that for several months he had been experiencing trismus. Med rec was reviewed and Reglan was d/c for concern of extrapyramidal symptoms  3. Right Stump Cellulitis Was initially treated with Vancomycin and Zosyn however this was narrowed to PO Keflex by the time of discharge.   4. Polysubstance abuse Pt described great desire to enter a rehabilitation program   5. GERD Pt  described burning epigastric and retrosternal discomfort, relieved with Carafate, PO Protonix and PRN viscous lidocaine.   Discharge Vitals:   BP (!) 142/87 (BP Location: Left Arm)   Pulse 97   Temp 98.2 F (36.8 C) (Oral)   Resp 16   Ht _0  (1.727 m)   Wt 154 lb 3.2 oz (69.9 kg)   SpO2 100%   BMI 23.45 kg/m   Pertinent Labs, Studies, and Procedures:  MRI right lower extremity: without osteomyelitis. Did show cellulitis Blood cultures negative x2 CBC: without leukocytosis  Discharge Instructions: Please complete your course of oral antibiotics for right lower extremity cellulitis. Please continue wound care of the affected area. Keep clean and dry. Please follow up with your primary care physician regarding your recent hospitalization. Please work with SNF to regain strength.   SignedEinar Gip, DO 04/17/2016, 11:42 AM   Pager: [phone]

## 2016-04-17 NOTE — NC FL2 (Signed)
[place] LEVEL OF CARE SCREENING TOOL     IDENTIFICATION  Patient Name: [name] Birthdate: 01-01-1966 Sex: male Admission Date (Current Location): 04/12/2016  [facility] and [place] Number:  Herbalist and Address:  [facility] [place], [place] [address] [place], [place] [address]      Provider Number: [id number]  Attending Physician Name and Address:  Sid Falcon, MD  Relative Name and Phone Number:       Current Level of Care: Hospital Recommended Level of Care: [place] Prior Approval Number:    Date Approved/Denied:   PASRR Number: [id number]  Discharge Plan: SNF    Current Diagnoses: Patient Active Problem List   Diagnosis Date Noted  . Cellulitis of leg 04/12/2016  . Pressure ulcer stage III 11/11/2015  . Diabetic gastroparesis associated with type 2 diabetes mellitus ([place])   . Type 2 diabetes mellitus with hyperosmolar nonketotic hyperglycemia ([place]) 10/20/2015  . Esophagitis   . Left shoulder pain 08/26/2015  . DKA (diabetic ketoacidoses) ([place]) 06/01/2015  . Diabetic ketoacidosis without coma ([place])   . Gastritis   . Intractable vomiting 03/12/2015  . Hyperglycemia due to type 2 diabetes mellitus ([place]) 03/12/2015  . Anemia 02/01/2015  . S/P bilateral BKA (below knee amputation) ([place]) 10/26/2014  . Hematemesis 10/26/2014  . Esophagitis (distal), erosive (09/2014) 09/16/2014    Class: Hospitalized for  . Phantom limb pain ([place]) 06/17/2014  . CN (constipation) 05/26/2014  . Erectile dysfunction 05/26/2014  . Anemia, iron deficiency 02/04/2014  . Hyperlipedemia 06/24/2012  . GERD (gastroesophageal reflux disease) 03/13/2012  . Depression 09/05/2011  . Marginal Housing 03/28/2011  . Essential hypertension 09/22/2008  . Diabetic neuropathy, painful ([place]) 07/21/2008  . CKD Stage 2 07/21/2008  . TOBACCO ABUSE 01/03/2006  . Polysubstance abuse 01/03/2006  . Diabetes mellitus type 2, uncontrolled,  with complications ([place]) [id number]    Orientation RESPIRATION BLADDER Height & Weight     Self, Time, Situation, Place  Normal Continent Weight: 69.9 kg (154 lb 3.2 oz) Height:  5\' 8"  (172.7 cm)  BEHAVIORAL SYMPTOMS/MOOD NEUROLOGICAL BOWEL NUTRITION STATUS   (NONE)  (NONE) Continent Diet (Carb Modified)  AMBULATORY STATUS COMMUNICATION OF NEEDS Skin   Extensive Assist (Uses two prosthesis) Verbally Other (Comment) (Full thickness wound right stump site)                       Personal Care Assistance Level of Assistance  Bathing, Feeding, Dressing Bathing Assistance: Limited assistance Feeding assistance: Independent Dressing Assistance: Limited assistance     Functional Limitations Info  Sight, Hearing, Speech Sight Info: Adequate Hearing Info: Adequate Speech Info: Adequate    SPECIAL CARE FACTORS FREQUENCY  PT (By licensed PT), OT (By licensed OT)     PT Frequency: 3/week OT Frequency: 3/week            Contractures Contractures Info: Not present    Additional Factors Info  Code Status, Allergies, Psychotropic, Insulin Sliding Scale Code Status Info: Full Code Allergies Info: NKDA Psychotropic Info: Prozac Insulin Sliding Scale Info: 3/day       Current Medications (04/17/2016):  This is the current hospital active medication list Current Facility-Administered Medications  Medication Dose Route Frequency Provider Last Rate Last Dose  . acetaminophen (TYLENOL) tablet 650 mg  650 mg Oral Q6H PRN [name], MD       Or  . acetaminophen (TYLENOL) suppository 650 mg  650 mg Rectal Q6H PRN Parth  Tiburcio Pea, MD      . alum & mag hydroxide-simeth (MAALOX/MYLANTA) 200-200-20 MG/5ML suspension 30 mL  30 mL Oral Q4H PRN Sid Falcon, MD   30 mL at 04/16/16 0850  . amLODipine (NORVASC) tablet 10 mg  10 mg Oral Daily [name], MD   10 mg at 04/16/16 0844  . atorvastatin (LIPITOR) tablet 40 mg  40 mg Oral q1800 [name], MD   40 mg at 04/16/16 1653  .  cephALEXin (KEFLEX) capsule 500 mg  500 mg Oral Q12H Bethany Molt, DO   500 mg at 04/17/16 1041  . collagenase (SANTYL) ointment   Topical Daily Sid Falcon, MD      . dextrose 50 % solution 25 mL  25 mL Intravenous PRN [name], MD      . diclofenac sodium (VOLTAREN) 1 % transdermal gel 4 g  4 g Topical Daily PRN [name], MD   4 g at 04/14/16 0028  . feeding supplement (GLUCERNA SHAKE) (GLUCERNA SHAKE) liquid 237 mL  237 mL Oral TID BM Sid Falcon, MD   237 mL at 04/17/16 1041  . FLUoxetine (PROZAC) capsule 20 mg  20 mg Oral Daily [name], MD   20 mg at 04/16/16 0845  . heparin injection 5,000 Units  5,000 Units Subcutaneous Q8H [name], MD   5,000 Units at 04/16/16 1653  . insulin aspart (novoLOG) injection 0-20 Units  0-20 Units Subcutaneous TID WC [name], MD   11 Units at 04/17/16 1200  . insulin aspart (novoLOG) injection 0-5 Units  0-5 Units Subcutaneous QHS [name], MD   2 Units at 04/16/16 2141  . insulin glargine (LANTUS) injection 48 Units  48 Units Subcutaneous Daily [name], MD      . lidocaine (XYLOCAINE) 2 % viscous mouth solution 15 mL  15 mL Mouth/Throat Q6H PRN Bethany Molt, DO      . lisinopril (PRINIVIL,ZESTRIL) tablet 20 mg  20 mg Oral Daily Bethany Molt, DO   20 mg at 04/16/16 0844  . Melatonin TABS 3 mg  3 mg Oral QHS PRN Alphonzo Grieve, MD   3 mg at 04/16/16 2323  . menthol-cetylpyridinium (CEPACOL) lozenge 3 mg  1 lozenge Oral PRN Bethany Molt, DO      . multivitamin with minerals tablet 1 tablet  1 tablet Oral Daily Sid Falcon, MD   1 tablet at 04/16/16 [phone]  . ondansetron (ZOFRAN) tablet 4 mg  4 mg Oral Q6H PRN [name], MD       Or  . ondansetron (ZOFRAN) injection 4 mg  4 mg Intravenous Q6H PRN [name], MD   4 mg at 04/13/16 0128  . pantoprazole (PROTONIX) EC tablet 40 mg  40 mg Oral BID [name], MD   40 mg at 04/16/16 2139  . senna-docusate (Senokot-S) tablet 1 tablet  1 tablet Oral BID Bethany Molt,  DO   1 tablet at 04/17/16 1041  . sodium chloride flush (NS) 0.9 % injection 3 mL  3 mL Intravenous Q12H [name], MD   3 mL at 04/17/16 1041  . sucralfate (CARAFATE) 1 GM/10ML suspension 1 g  1 g Oral Q6H Bethany Molt, DO   1 g at 04/17/16 1159     Discharge Medications: Please see discharge summary for a list of discharge medications.  Relevant Imaging Results:  Relevant Lab Results:   Additional Information SSN: [id number]. Please note the patient is homeless and has a history of marijuana and  cocaine abuse. The patient wil need to be transitioned to ALF after DC from SNF or transition to public housing which he has been working on. The patient understands that he cannot use any substances in the facility and is motivated to seek treatment when able.  Rigoberto Noel, LCSW

## 2016-04-17 NOTE — Evaluation (Signed)
Occupational Therapy Evaluation Patient Details Name: [name] MRN: [id number] DOB: 05-Sep-1965 Today's Date: 04/17/2016    History of Present Illness 51yo man with PMH of ETOH related pancreatitis, DM2, bilateral BKA, gastroparesis, CKD2, HTN, left nephrectomy who presented with nausea, vomiting and abdominal pain and was found to be in DKA.   Clinical Impression   Pt with decline in function and safety with ADLs and ADL mobility with decreased endurance, balance and R hand FMC/function. Pt would benefit from acute OT services to address impairments to increase level of function and safety    Follow Up Recommendations  SNF    Equipment Recommendations  None recommended by OT;Other (comment) (TBD at next venue of care)    Recommendations for Other Services       Precautions / Restrictions Precautions Precautions: Fall Precaution Comments: Pt is S/P bilateral BKA- has prosthesis and a wheelchair Restrictions Weight Bearing Restrictions: No      Mobility Bed Mobility Overal bed mobility: Modified Independent             General bed mobility comments: able to reposition in bed and come to EOB without cues or assist  Transfers Overall transfer level: Needs assistance Equipment used: None Transfers: Comptroller transfers: Min guard   General transfer comment: pt declined stating "you should have come an hour ago with other therapist". Per PT note, pt is min guard A with AP transfers    Balance Overall balance assessment: History of Falls                                          ADL Overall ADL's : Needs assistance/impaired     Grooming: Wash/dry hands;Wash/dry face;Set up   Upper Body Bathing: Sitting;Set up (simulated)   Lower Body Bathing: Sitting/lateral leans;Min guard;Minimal assistance   Upper Body Dressing : Sitting;Set up   Lower Body Dressing: Sitting/lateral leans;Min  guard;Minimal assistance     Toilet Transfer Details (indicate cue type and reason): pt declined stating "you should have come an hour ago with other therapist". Per PT note, pt is min guard A with AP transfers Toileting- Clothing Manipulation and Hygiene: Min guard;Sitting/lateral lean (simulated)       Functional mobility during ADLs: Modified independent (bed mobility) General ADL Comments: pt declined OOB mobility stating "you should have come an hour ago with other therapist". Per PT note, pt is min guard A with AP transfers     Vision Vision Assessment?: No apparent visual deficits              Pertinent Vitals/Pain Pain Assessment: 0-10 Pain Score: 7  Pain Location: R LE and abdomen Pain Descriptors / Indicators: Discomfort Pain Intervention(s): Monitored during session;Repositioned     Hand Dominance Right   Extremity/Trunk Assessment Upper Extremity Assessment Upper Extremity Assessment: Overall WFL for tasks assessed   Lower Extremity Assessment Lower Extremity Assessment: Defer to PT evaluation RLE Deficits / Details: BKA with distal limb wound noted RLE Sensation: decreased light touch LLE Deficits / Details: BKA        Communication Communication Communication: No difficulties   Cognition Arousal/Alertness: Awake/alert Behavior During Therapy: WFL for tasks assessed/performed Overall Cognitive Status: Within Functional Limits for tasks assessed                     General Comments  Pt pleasant                 Home Living Family/patient expects to be discharged to:: Private residence Living Arrangements: Non-relatives/Friends Available Help at Discharge: Friend(s);Available PRN/intermittently Type of Home: Apartment Home Access: Stairs to enter Entrance Stairs-Number of Steps: flight Entrance Stairs-Rails: Can reach both Home Layout: One level     Bathroom Shower/Tub: Teacher, early years/pre: Standard     Home  Equipment: Environmental consultant - 2 wheels;Crutches;Bedside commode;Wheelchair - Education officer, community - power          Prior Functioning/Environment Level of Independence: Independent with assistive device(s)    ADL's / Homemaking Assistance Needed: ptreports that he is independent with ADLs, toileting. Pt states that R hand with numbness and that he sometimes has difficulty gripping utensils and other onjects    Comments: Pt is S/P bilateral BKA- has prosthesis and a wheelchair.         OT Problem List: Decreased activity tolerance;Decreased coordination;Impaired UE functional use;Decreased knowledge of use of DME or AE;Pain   OT Treatment/Interventions: Self-care/ADL training;DME and/or AE instruction;Therapeutic activities;Patient/family education;Neuromuscular education    OT Goals(Current goals can be found in the care plan section) Acute Rehab OT Goals Patient Stated Goal: to get better OT Goal Formulation: With patient Time For Goal Achievement: 04/24/16 Potential to Achieve Goals: Good ADL Goals Pt Will Perform Lower Body Bathing: with min guard assist;with supervision;with set-up;sitting/lateral leans Pt Will Perform Lower Body Dressing: with min guard assist;with supervision;with set-up;sitting/lateral leans Pt Will Transfer to Toilet: with supervision;anterior/posterior transfer Pt Will Perform Toileting - Clothing Manipulation and hygiene: with supervision;with modified independence;sitting/lateral leans Pt Will Perform Tub/Shower Transfer: with min guard assist;with supervision;anterior/posterior transfer Additional ADL Goal #1: R hand FMC/ROM exercises to decrease numbness for increased ease of functional use  OT Frequency: Min 2X/week   Barriers to D/C: Decreased caregiver support                        End of Session    Activity Tolerance: Patient limited by fatigue;Patient limited by pain Patient left: in bed   Time: 0925-0950 OT Time Calculation (min): 25  min Charges:  OT General Charges $OT Visit: 1 Procedure OT Evaluation $OT Eval Moderate Complexity: 1 Procedure OT Treatments $Therapeutic Activity: 8-22 mins G-Codes:    Britt Bottom 04/17/2016, 10:43 AM

## 2016-04-17 NOTE — Clinical Social Work Note (Signed)
Patient has a bed at [facility] [place]. Patient has accepted bed and agreeable to admit today. CSW will facilitate DC.  Liz Beach MSW, [place], [place], [id number]

## 2016-04-17 NOTE — Progress Notes (Signed)
   Subjective: [name] was seen and evaluated today at bedside. Reports no acute problems overnight. Reports his abdominal pain is continuing to improve however still complains of heartburn. Was able to eat a solid breakfast this morning without vomiting. Expressed frustration today that he is unable to get into inpatient rehab directly from the hospital and that insurance wont pay for it.   Objective:  Vital signs in last 24 hours: Vitals:   04/16/16 2003 04/17/16 0006 04/17/16 0432 04/17/16 0754  BP: (!) 156/96 (!) 142/79 (!) 142/87   Pulse: (!) 103 93 97   Resp: 10 16    Temp: 99 F (37.2 C) 98.7 F (37.1 C) 98.9 F (37.2 C) 98.2 F (36.8 C)  TempSrc: Oral Oral Oral Oral  SpO2: 100% 99% 100%   Weight:   154 lb 3.2 oz (69.9 kg)   Height:       General: African [place] male resting comfortably in bed. Head at end of bed.  HENT: No conjunctival injection, icterus or ptosis. Cardiovascular: Regular rate and rhythm. No murmur or rub appreciated. Pulmonary: CTA BL, no wheezing. Unlabored breathing.  Abdomen: Soft, non-tender and non-distended. +bowel sounds.  Extremities: BL BKA.  Skin: Warm, dry. No cyanosis.   Psych: Mood labile and affect was mood congruent. Splitting demonstrated.   Assessment/Plan:  Principal Problem:   Diabetic ketoacidosis without coma ([place]) Active Problems:   Diabetes mellitus type 2, uncontrolled, with complications ([facility])   Polysubstance abuse   S/P bilateral BKA (below knee amputation) ([place])   DKA (diabetic ketoacidoses) ([facility])   Cellulitis of leg  Diabetic Ketoacidosis Resolved. Have d/c telemetry.  Type 2 Diabetes, uncontrolled - complicated by gastroparesis and neuropathy  Lantus was increased yesterday to 45 units as his blood sugars have not been controlled. Will continue to monitor his response to this throughout the day - so far AM blood sugar 247. With his most recent HbA1c 13%, suspect this is better than his baseline.  -Continue Lantus  45 units -Was started on resistant sliding scale yesterday -Does not appear to have return of gastroparesis symptoms -Pt reported yesterday that he does not have access to medications... Review shows that he is able to get these medications for free.   Right BKA stump wound infection Currently on oral Keflex and is improving. Wound care on. -Oral Keflex to complete at 10 day course -(Day 3 of keflex, this is day 5 of antibiotics) -Carb modified diet  GERD Improving. Reassured patient it will improve overtime with continued medication compliance.  -Continue Carafate, Protonix  Hypertension Continuing lisinopril 20 mg and Amlodipine 10 mg.   Dispo: Anticipated discharge pending SNF placement.   Wesley Ellenwood, DO 04/17/2016, 9:57 AM Pager: [phone]

## 2016-04-17 NOTE — Progress Notes (Signed)
  Date: 04/17/2016  Patient name: [name]  Medical record number: [id number]  Date of birth: 1966/03/04   I have personally seen and evaluated this patient the plan of care was discussed with the house staff. Please see Dr. Rober Minion note for complete details. I concur with her findings.  PT evaluated [name] today and did recommend SNF.  Will discuss with CM/SW and see if we can facilitate acceptance to a nursing facility.  He is nearing ready for discharge.   Sid Falcon, MD 04/17/2016, 10:23 AM

## 2016-04-17 NOTE — Progress Notes (Signed)
Report called to [facility]. Edward Qualia RN

## 2016-04-17 NOTE — Evaluation (Signed)
Physical Therapy Evaluation Patient Details Name: [name] MRN: [id number] DOB: 27-Nov-1965 Today's Date: 04/17/2016   History of Present Illness  51yo man with PMH of ETOH related pancreatitis, DM2, bilateral BKA, gastroparesis, CKD2, HTN, left nephrectomy who presented with nausea, vomiting and abdominal pain and was found to be in DKA.  Clinical Impression  Patient demonstrates deficits in functional mobility as indicated below. Will benefit from continued skilled PT to address deficits and maximize function. Will see as indicated and progress as tolerated. At this time, patient expressed concerns regarding ability to care for himself and perform daily functional tasks at independence level. Patient now in a 2nd floor apt with insufficient care giver support. May benefit from ST SNF vs ALF to address activity tolerance and improve independence with prosthesis.    Follow Up Recommendations SNF    Equipment Recommendations  None recommended by PT    Recommendations for Other Services       Precautions / Restrictions Precautions Precautions: Fall Precaution Comments: Pt is S/P bilateral BKA- has prosthesis and a wheelchair Restrictions Weight Bearing Restrictions: No      Mobility  Bed Mobility Overal bed mobility: Modified Independent             General bed mobility comments: able to reposition in bed and come to EOB without cues or assist  Transfers Overall transfer level: Needs assistance Equipment used: None Transfers: Comptroller transfers: Min guard   General transfer comment: min guard assist, increased time and effort noted. performed to and from chair, increased fatigue noted upon return from chair  Ambulation/Gait             General Gait Details: deferred (prosthesis not available at this time)  Stairs            Wheelchair Mobility    Modified Rankin (Stroke Patients Only)       Balance  Overall balance assessment: History of Falls                                           Pertinent Vitals/Pain Pain Assessment: 0-10 Pain Score: 8  Pain Location: abdominal pain and R residual limb Pain Descriptors / Indicators: Discomfort;Aching Pain Intervention(s): Monitored during session    Home Living Family/patient expects to be discharged to:: Private residence Living Arrangements: Non-relatives/Friends Available Help at Discharge: Friend(s);Available PRN/intermittently Type of Home: Apartment Home Access: Stairs to enter Entrance Stairs-Rails: Can reach both Entrance Stairs-Number of Steps: flight Home Layout: One level Home Equipment: Walker - 2 wheels;Crutches;Bedside commode;Wheelchair - manual      Prior Function Level of Independence: Independent with assistive device(s)         Comments: Pt is S/P bilateral BKA- has prosthesis and a wheelchair.      Hand Dominance   Dominant Hand: Right    Extremity/Trunk Assessment   Upper Extremity Assessment Upper Extremity Assessment: Overall WFL for tasks assessed    Lower Extremity Assessment Lower Extremity Assessment: RLE deficits/detail;LLE deficits/detail RLE Deficits / Details: BKA with distal limb wound noted RLE Sensation: decreased light touch LLE Deficits / Details: BKA        Communication   Communication: No difficulties  Cognition Arousal/Alertness: Awake/alert Behavior During Therapy: WFL for tasks assessed/performed Overall Cognitive Status: Within Functional Limits for tasks assessed  General Comments      Exercises     Assessment/Plan    PT Assessment Patient needs continued PT services  PT Problem List Decreased strength;Decreased activity tolerance;Decreased balance;Decreased mobility;Decreased safety awareness;Pain          PT Treatment Interventions DME instruction;Gait training;Stair training;Functional mobility  training;Therapeutic activities;Therapeutic exercise;Patient/family education    PT Goals (Current goals can be found in the Care Plan section)  Acute Rehab PT Goals Patient Stated Goal: to get better PT Goal Formulation: With patient Time For Goal Achievement: 05/01/16 Potential to Achieve Goals: Good    Frequency Min 2X/week   Barriers to discharge Decreased caregiver support      Co-evaluation               End of Session   Activity Tolerance: Patient limited by fatigue Patient left: in bed;with call bell/phone within reach Nurse Communication: Mobility status         Time: [id number] PT Time Calculation (min) (ACUTE ONLY): 18 min   Charges:   PT Evaluation $PT Eval Moderate Complexity: 1 Procedure     PT G Codes:        Duncan Dull 04-28-16, 8:50 AM  Alben Deeds, PT DPT  [phone]

## 2016-04-17 NOTE — Progress Notes (Addendum)
Inpatient Diabetes Program Recommendations  AACE/ADA: New Consensus Statement on Inpatient Glycemic Control (2015)  Target Ranges:  Prepandial:   less than 140 mg/dL      Peak postprandial:   less than 180 mg/dL (1-2 hours)      Critically ill patients:  140 - 180 mg/dL   Lab Results  Component Value Date   GLUCAP 262 (H) 04/17/2016   HGBA1C 13.7 (H) 11/10/2015   Results for DAROL, COLA (MRN [id number]) as of 04/17/2016 12:46  Ref. Range 04/16/2016 11:26 04/16/2016 16:07 04/16/2016 20:08 04/17/2016 07:53 04/17/2016 11:34  Glucose-Capillary Latest Ref Range: 65 - 99 mg/dL 296 (H) 331 (H) 237 (H) 247 (H) 262 (H)   Review of Glycemic Control  Diabetes history: DM2 Outpatient Diabetes medications:Lantus 40 units QHS, Novolog 0-15 units TIDAC, Metformin 1000 mg daily Current orders for Inpatient glycemic control: Novolog 0-20 units TIDAC and 0-5 units QHS, Lantus 48 units daily starting tomorrow 04/15/16  Inpatient Diabetes Program Recommendations.  Please consider:    decreasing correction to Novolog 0-9 units TIDAC and 0-5 units QHS and    adding meal coverage of Novolog 4 units TIDAC if patient eats > 50% of meal.  Thank you,  Windy Carina, RN, MSN Diabetes Coordinator Inpatient Diabetes Program [phone] (Team Pager)

## 2016-04-17 NOTE — Progress Notes (Signed)
ED CM consulted by ED charge nurse concerning patient.  He was discharged this evening to [place] and transported by [facility]. Ambulance driver stated, the facility claimed they had no access to orders prior to 5 pm.  Acupuncturist contacted the [employer] nurse here at [facility], at this time the patient requested the [place] driver to return back to the hospital, patient was brought to ED. CM notified ED CSW for disposition plan.

## 2016-04-17 NOTE — Clinical Social Work Placement (Addendum)
   CLINICAL SOCIAL WORK PLACEMENT  NOTE  Date:  04/17/2016  Patient Details  Name: [name] MRN: [id number] Date of Birth: 21-Aug-1965  Clinical Social Work is seeking post-discharge placement for this patient at the [place] level of care (*CSW will initial, date and re-position this form in  chart as items are completed):  Yes   Patient/family provided with [place] Work Department's list of facilities offering this level of care within the geographic area requested by the patient (or if unable, by the patient's family).  Yes   Patient/family informed of their freedom to choose among providers that offer the needed level of care, that participate in Medicare, Medicaid or managed care program needed by the patient, have an available bed and are willing to accept the patient.  Yes   Patient/family informed of [place]'s ownership interest in [facility] and [facility], as well as of the fact that they are under no obligation to receive care at these facilities.  PASRR submitted to EDS on       PASRR number received on       Existing PASRR number confirmed on 04/17/16     FL2 transmitted to all facilities in geographic area requested by pt/family on 04/17/16     FL2 transmitted to all facilities within larger geographic area on       Patient informed that his/her managed care company has contracts with or will negotiate with certain facilities, including the following:        Yes   Patient/family informed of bed offers received.  Patient chooses bed at [facility] and [place] [place] recommends and patient chooses bed at      Patient to be transferred to [facility] on 04/17/16.  Patient to be transferred to facility by Ambulance     Patient family notified on 04/17/16 of transfer.  Name of family member notified:  Patient notified his family.     PHYSICIAN Please prepare priority discharge summary,  including medications, Please prepare prescriptions, Please sign FL2     Additional Comment:   The patient states that his friend will be bringing his prosthesis to the SNF. _______________________________________________ Rigoberto Noel, LCSW 04/17/2016, 5:19 PM

## 2016-04-17 NOTE — Clinical Social Work Placement (Signed)
   CLINICAL SOCIAL WORK PLACEMENT  NOTE  Date:  04/17/2016  Patient Details  Name: [name] MRN: [id number] Date of Birth: [birth year]  Clinical Social Work is seeking post-discharge placement for this patient at the [place] level of care (*CSW will initial, date and re-position this form in  chart as items are completed):  Yes   Patient/family provided with [place] Work Department's list of facilities offering this level of care within the geographic area requested by the patient (or if unable, by the patient's family).  Yes   Patient/family informed of their freedom to choose among providers that offer the needed level of care, that participate in Medicare, Medicaid or managed care program needed by the patient, have an available bed and are willing to accept the patient.  Yes   Patient/family informed of [facility]'s ownership interest in [facility] and [facility], as well as of the fact that they are under no obligation to receive care at these facilities.  PASRR submitted to EDS on       PASRR number received on       Existing PASRR number confirmed on 04/17/16     FL2 transmitted to all facilities in geographic area requested by pt/family on 04/17/16     FL2 transmitted to all facilities within larger geographic area on       Patient informed that his/her managed care company has contracts with or will negotiate with certain facilities, including the following:            Patient/family informed of bed offers received.  Patient chooses bed at       Physician recommends and patient chooses bed at      Patient to be transferred to   on  .  Patient to be transferred to facility by       Patient family notified on   of transfer.  Name of family member notified:        PHYSICIAN Please prepare priority discharge summary, including medications, Please prepare prescriptions, Please sign FL2     Additional Comment:   Patient understands that his options will be very limited due to social situation and insurance. _______________________________________________ Rigoberto Noel, LCSW 04/17/2016, 1:49 PM

## 2016-04-20 ENCOUNTER — Other Ambulatory Visit: Payer: Self-pay | Admitting: *Deleted

## 2016-04-21 ENCOUNTER — Other Ambulatory Visit: Payer: Self-pay | Admitting: Internal Medicine

## 2016-04-24 ENCOUNTER — Telehealth (INDEPENDENT_AMBULATORY_CARE_PROVIDER_SITE_OTHER): Payer: Self-pay | Admitting: *Deleted

## 2016-04-24 NOTE — Telephone Encounter (Signed)
[facility] called asking if pt can be re casted for his prosthetic. Pt feels it is too big and is currently wearing too many socks.

## 2016-04-24 NOTE — Telephone Encounter (Signed)
I called back and spoke with Hassan Rowan and advised to hold off on casting at this time. Patient was recently admitted for  Cellulitis of right leg, patient is RBKA and is at risk for greater skin break down and worsening of infection with prosthetic wear. He may have even more volume loss after cellulitis resolves. They will call our office once abx therapy is completed.

## 2016-04-25 ENCOUNTER — Telehealth: Payer: Self-pay

## 2016-04-25 NOTE — Telephone Encounter (Signed)
Pt called requesting for a help with housing. Please call pt back.

## 2016-04-28 ENCOUNTER — Telehealth: Payer: Self-pay | Admitting: Internal Medicine

## 2016-04-28 NOTE — Telephone Encounter (Signed)
APT. REMINDER CALL, NO PHONE NUMBER

## 2016-05-01 ENCOUNTER — Encounter: Payer: Medicaid Other | Admitting: Internal Medicine

## 2016-05-02 ENCOUNTER — Telehealth (INDEPENDENT_AMBULATORY_CARE_PROVIDER_SITE_OTHER): Payer: Self-pay | Admitting: Orthopedic Surgery

## 2016-05-02 NOTE — Telephone Encounter (Signed)
Caryl Pina with [facility] called needing a Rx to get a (recast) for the patient. The number to contact Caryl Pina is [phone]  The fax# is [phone]

## 2016-05-02 NOTE — Telephone Encounter (Signed)
Call to patient he could not be located spoke with social worker Alexis Goodell who reports he is working on housing for patient prior to discharge

## 2016-05-03 NOTE — Telephone Encounter (Signed)
Tried calling back but put on hold for prolonged periods of time. Patient needs in office eval for this. Patient not seen since 2015. He was recently in hospital for cellulitis. Recasting may not even be appropriate needs physician evaluation. Will fax letter to them stating this.

## 2016-05-04 NOTE — Telephone Encounter (Signed)
Letter faxed to [facility] and [place]

## 2016-05-08 ENCOUNTER — Other Ambulatory Visit: Payer: Self-pay | Admitting: Internal Medicine

## 2016-05-09 NOTE — Telephone Encounter (Signed)
Attempted call patient regarding missed appointment no answer on emergency contact number unable to leave message

## 2016-05-10 NOTE — Telephone Encounter (Signed)
Patient was recently hospitalized- he may be in a SNF still

## 2016-05-15 ENCOUNTER — Ambulatory Visit (INDEPENDENT_AMBULATORY_CARE_PROVIDER_SITE_OTHER): Payer: Medicaid Other | Admitting: Orthopedic Surgery

## 2016-05-15 ENCOUNTER — Encounter (INDEPENDENT_AMBULATORY_CARE_PROVIDER_SITE_OTHER): Payer: Self-pay | Admitting: Orthopedic Surgery

## 2016-05-15 VITALS — Ht 68.0 in | Wt 154.0 lb

## 2016-05-15 DIAGNOSIS — Z89512 Acquired absence of left leg below knee: Secondary | ICD-10-CM | POA: Diagnosis not present

## 2016-05-15 DIAGNOSIS — Z89511 Acquired absence of right leg below knee: Secondary | ICD-10-CM | POA: Diagnosis not present

## 2016-05-15 NOTE — Progress Notes (Signed)
Office Visit Note   Patient: [name]           Date of Birth: [birth year]           MRN: [id number] Visit Date: 05/15/2016              Requested by: [name], MD [address] [place], [place] [address] PCP: [name], MD  Chief Complaint  Patient presents with  . Right Leg - Follow-up    Hx right bka recent hospital admission for cellulitis.     HPI: Patient was admitted early January to the hospital for DKA and right below the knee amputation cellulitis. He was treated with IV ABX . Pt called for an rx for prosthetic casting for new socket and advised the pt he needed to come in for evaluation first. He has not been in the office for 3 years and with recent infection needed eval before order could be written. Pt resides at [facility] and rehab.  Here today with both prosthetics and in a wheelchair for ambulation. Pamella Pert, RMA    Assessment & Plan: Visit Diagnoses:  1. S/P bilateral BKA (below knee amputation) ([place])     Plan: Prescription written for [facility] for bilateral transtibial amputation prosthetics with new liner sockets materials supplies evaluate for a new foot and ankle.  Follow-Up Instructions: Return if symptoms worsen or fail to improve.   Ortho Exam On examination patient is alert oriented no adenopathy well-dressed normal affect normal respiratory effort he ambulates in a wheelchair. Examination of his residual limbs he has a callus formation on the left leg with no ulcers no cellulitis no signs of infection. Right leg patient has resolved his infection he does have callus over from an bearing and does have a very small ulcerations over the residual limb from an bearing from poorly fitting prosthetic sockets.  Imaging: No results found.  Orders:  No orders of the defined types were placed in this encounter.  No orders of the defined types were placed in this encounter.    Procedures: No procedures performed  Clinical  Data: No additional findings.  Subjective: Review of Systems  Objective: Vital Signs: Ht 5\' 8"  (1.727 m)   Wt 154 lb (69.9 kg)   BMI 23.42 kg/m   Specialty Comments:  No specialty comments available.  PMFS History: Patient Active Problem List   Diagnosis Date Noted  . Cellulitis of leg 04/12/2016  . Pressure ulcer stage III 11/11/2015  . Diabetic gastroparesis associated with type 2 diabetes mellitus ([place])   . Type 2 diabetes mellitus with hyperosmolar nonketotic hyperglycemia ([place]) 10/20/2015  . Esophagitis   . Left shoulder pain 08/26/2015  . DKA (diabetic ketoacidoses) ([place]) 06/01/2015  . Diabetic ketoacidosis without coma ([place])   . Gastritis   . Intractable vomiting 03/12/2015  . Hyperglycemia due to type 2 diabetes mellitus ([place]) 03/12/2015  . Anemia 02/01/2015  . S/P bilateral BKA (below knee amputation) ([place]) 10/26/2014  . Hematemesis 10/26/2014  . Esophagitis (distal), erosive (09/2014) 09/16/2014    Class: Hospitalized for  . Phantom limb pain ([place]) 06/17/2014  . CN (constipation) 05/26/2014  . Erectile dysfunction 05/26/2014  . Anemia, iron deficiency 02/04/2014  . Cellulitis 07/22/2013  . Hyperlipedemia 06/24/2012  . GERD (gastroesophageal reflux disease) 03/13/2012  . Depression 09/05/2011  . Marginal Housing 03/28/2011  . Essential hypertension 09/22/2008  . Diabetic neuropathy, painful ([place]) 07/21/2008  . CKD Stage 2 07/21/2008  . TOBACCO ABUSE 01/03/2006  .  Polysubstance abuse 01/03/2006  . Diabetes mellitus type 2, uncontrolled, with complications ([place]) [id number]   Past Medical History:  Diagnosis Date  . 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 Below knee amputation status ([place]) 12/12/2013  . Cellulitis and abscess of leg 01/31/2014  . Cellulitis of left foot 03/2008   left 4th and 5th metatarsal area  . CKD (chronic kidney disease) stage 2, GFR 60-89 ml/min    BL SCr 1.3-1.4  . Depression   . Diabetic foot ulcer ([place])    s/p Right first ray  amputation, left transmetatarsal amputation with revision  . Diabetic peripheral neuropathy ([place])   . Exposure to trichomonas    treated empirically  . GERD (gastroesophageal reflux disease)   . History of chronic pyelonephritis    secondary to left pyeloureteral junction obstruction.  Marland Kitchen History of drug abuse    Cocaine and marijuana  . History of left below knee amputation ([place]) 01/21/2014   Performed on    . History of prolonged Q-T interval on ECG   . Hyperlipidemia   . Hypertension   . Mallory [name] tear April 2009  . Onychomycosis   . Osteomyelitis of left leg ([place]) 01/21/2014  . Peripheral vascular disease ([place])   . Recurrent Foot Osteomyelitis 02/19/2012   Recurrent osteomyelitis of toes with multiple ray amputations Follows up with Dr Sharol Given   . Renal and perinephric abscess 09/2008   s/p left nephrectomy,massive Left pyonephrosis, s/p 2L pus drained via percutaneous   . S/P Right BKA  10/09/2012   09/13/2012 due to osteomyelitis with sepsis. Healed nicely  Phantom limp pain Awaiting prosthetic   . Status post below knee amputation of right lower extremity ([place]) 03/23/2014  . Type 2 diabetes mellitus, uncontrolled, with renal complications ([place])   . Ulcerative esophagitis [id number]   severe, complicated with UGI bleed    Family History  Problem Relation Age of Onset  . Diabetes type II Mother   . Pancreatic cancer Father     Past Surgical History:  Procedure Laterality Date  . AMPUTATION  08/24/2011   Procedure: AMPUTATION RAY;  Surgeon: Meredith Pel, MD;  Location: [facility];  Service: Orthopedics;  Laterality: Left;  left fourth toe Ray Resection  . AMPUTATION  12/05/2011   Procedure: AMPUTATION RAY;  Surgeon: Meredith Pel, MD;  Location: [facility];  Service: Orthopedics;  Laterality: Left;  . AMPUTATION  02/18/2012   Procedure: AMPUTATION DIGIT;  Surgeon: Mcarthur Rossetti, MD;  Location: [facility];  Service: Orthopedics;  Laterality: Left;  revison transmetatarsal  .  AMPUTATION Right 09/01/2012   Procedure: FOOT 1ST RAY AMPUTATION;  Surgeon: Newt Minion, MD;  Location: [facility];  Service: Orthopedics;  Laterality: Right;  . AMPUTATION Left 09/01/2012   Procedure: FOOT REVISION TRANSMETATARSAL AMPUTATION ;  Surgeon: Newt Minion, MD;  Location: [facility];  Service: Orthopedics;  Laterality: Left;  . AMPUTATION Right 09/13/2012   Procedure: AMPUTATION BELOW KNEE;  Surgeon: Newt Minion, MD;  Location: [facility];  Service: Orthopedics;  Laterality: Right;  Right Below Knee Amputation  . AMPUTATION Left 12/12/2013   Procedure: Left Below Knee Amputation;  Surgeon: Newt Minion, MD;  Location: [facility];  Service: Orthopedics;  Laterality: Left;  . BELOW KNEE LEG AMPUTATION Left 12/12/2013   Archie Endo 12/12/2013  . ESOPHAGOGASTRODUODENOSCOPY (EGD) WITH PROPOFOL N/A 09/16/2014   Procedure: ESOPHAGOGASTRODUODENOSCOPY (EGD) WITH PROPOFOL;  Surgeon: Wonda Horner, MD;  Location: [facility];  Service: Endoscopy;  Laterality: N/A;  . I&D  EXTREMITY  08/24/2011   Procedure: IRRIGATION AND DEBRIDEMENT EXTREMITY;  Surgeon: Meredith Pel, MD;  Location: [facility];  Service: Orthopedics;  Laterality: Left;  . I&D EXTREMITY  02/18/2012   Procedure: IRRIGATION AND DEBRIDEMENT EXTREMITY;  Surgeon: Mcarthur Rossetti, MD;  Location: [facility];  Service: Orthopedics;  Laterality: Left;  . I&D EXTREMITY Left 08/28/2012   Procedure: IRRIGATION AND DEBRIDEMENT EXTREMITY;  Surgeon: Linna Hoff, MD;  Location: [facility];  Service: Orthopedics;  Laterality: Left;  CYSTO TUBING/IRRIGATION, LEAD HAND.  . I&D EXTREMITY Left 08/30/2012   Procedure: IRRIGATION AND DEBRIDEMENT EXTREMITY;  Surgeon: Linna Hoff, MD;  Location: [facility];  Service: Orthopedics;  Laterality: Left;  . NEPHRECTOMY Left   . STUMP REVISION Left 01/23/2014   Procedure: STUMP REVISION;  Surgeon: Newt Minion, MD;  Location: [facility];  Service: Orthopedics;  Laterality: Left;  . TOE AMPUTATION Left 06/2011   4th toe   Social History    Occupational History  . disabled    Social History Main Topics  . Smoking status: Current Every Day Smoker    Packs/day: 0.50    Years: 36.00    Types: Cigarettes, Cigars  . Smokeless tobacco: Never Used  . Alcohol use 6.6 oz/week    11 Shots of liquor per week     Comment: 10/20/2015 "drink 1 pint/week"  . Drug use: Yes    Types: Marijuana, "Crack" cocaine, Cocaine     Comment: 10/20/2015 "don't use much of any drug now"                                                                                                                                   . Sexual activity: Yes    Birth control/ protection: None

## 2016-06-22 ENCOUNTER — Encounter (HOSPITAL_COMMUNITY): Payer: Self-pay | Admitting: Emergency Medicine

## 2016-06-22 ENCOUNTER — Emergency Department (HOSPITAL_COMMUNITY)
Admission: EM | Admit: 2016-06-22 | Discharge: 2016-07-09 | Disposition: E | Payer: Medicaid Other | Attending: Emergency Medicine | Admitting: Emergency Medicine

## 2016-06-22 ENCOUNTER — Emergency Department (HOSPITAL_COMMUNITY)
Admission: EM | Admit: 2016-06-22 | Discharge: 2016-07-09 | Disposition: E | Payer: Medicaid Other | Source: Home / Self Care | Attending: Emergency Medicine | Admitting: Emergency Medicine

## 2016-06-22 ENCOUNTER — Emergency Department (HOSPITAL_COMMUNITY): Payer: Medicaid Other

## 2016-06-22 DIAGNOSIS — E1165 Type 2 diabetes mellitus with hyperglycemia: Secondary | ICD-10-CM | POA: Insufficient documentation

## 2016-06-22 DIAGNOSIS — R778 Other specified abnormalities of plasma proteins: Secondary | ICD-10-CM | POA: Diagnosis not present

## 2016-06-22 DIAGNOSIS — E872 Acidosis: Secondary | ICD-10-CM | POA: Insufficient documentation

## 2016-06-22 DIAGNOSIS — I472 Ventricular tachycardia: Secondary | ICD-10-CM | POA: Insufficient documentation

## 2016-06-22 DIAGNOSIS — I129 Hypertensive chronic kidney disease with stage 1 through stage 4 chronic kidney disease, or unspecified chronic kidney disease: Secondary | ICD-10-CM | POA: Diagnosis not present

## 2016-06-22 DIAGNOSIS — K922 Gastrointestinal hemorrhage, unspecified: Secondary | ICD-10-CM | POA: Insufficient documentation

## 2016-06-22 DIAGNOSIS — Z794 Long term (current) use of insulin: Secondary | ICD-10-CM | POA: Insufficient documentation

## 2016-06-22 DIAGNOSIS — I469 Cardiac arrest, cause unspecified: Secondary | ICD-10-CM | POA: Insufficient documentation

## 2016-06-22 DIAGNOSIS — N182 Chronic kidney disease, stage 2 (mild): Secondary | ICD-10-CM | POA: Diagnosis not present

## 2016-06-22 DIAGNOSIS — Z89511 Acquired absence of right leg below knee: Secondary | ICD-10-CM | POA: Insufficient documentation

## 2016-06-22 DIAGNOSIS — E871 Hypo-osmolality and hyponatremia: Secondary | ICD-10-CM | POA: Insufficient documentation

## 2016-06-22 DIAGNOSIS — E876 Hypokalemia: Secondary | ICD-10-CM | POA: Insufficient documentation

## 2016-06-22 DIAGNOSIS — I959 Hypotension, unspecified: Secondary | ICD-10-CM | POA: Diagnosis not present

## 2016-06-22 DIAGNOSIS — H5702 Anisocoria: Secondary | ICD-10-CM

## 2016-06-22 DIAGNOSIS — E1151 Type 2 diabetes mellitus with diabetic peripheral angiopathy without gangrene: Secondary | ICD-10-CM | POA: Diagnosis not present

## 2016-06-22 DIAGNOSIS — D62 Acute posthemorrhagic anemia: Secondary | ICD-10-CM | POA: Insufficient documentation

## 2016-06-22 DIAGNOSIS — G936 Cerebral edema: Secondary | ICD-10-CM | POA: Insufficient documentation

## 2016-06-22 DIAGNOSIS — I4510 Unspecified right bundle-branch block: Secondary | ICD-10-CM | POA: Insufficient documentation

## 2016-06-22 DIAGNOSIS — J9601 Acute respiratory failure with hypoxia: Secondary | ICD-10-CM

## 2016-06-22 DIAGNOSIS — R4020 Unspecified coma: Secondary | ICD-10-CM

## 2016-06-22 DIAGNOSIS — R579 Shock, unspecified: Secondary | ICD-10-CM

## 2016-06-22 DIAGNOSIS — R58 Hemorrhage, not elsewhere classified: Secondary | ICD-10-CM

## 2016-06-22 DIAGNOSIS — S069X0A Unspecified intracranial injury without loss of consciousness, initial encounter: Secondary | ICD-10-CM | POA: Diagnosis not present

## 2016-06-22 DIAGNOSIS — E1110 Type 2 diabetes mellitus with ketoacidosis without coma: Secondary | ICD-10-CM | POA: Diagnosis not present

## 2016-06-22 DIAGNOSIS — E1311 Other specified diabetes mellitus with ketoacidosis with coma: Secondary | ICD-10-CM

## 2016-06-22 DIAGNOSIS — Z66 Do not resuscitate: Secondary | ICD-10-CM | POA: Insufficient documentation

## 2016-06-22 DIAGNOSIS — N179 Acute kidney failure, unspecified: Secondary | ICD-10-CM | POA: Diagnosis not present

## 2016-06-22 DIAGNOSIS — R578 Other shock: Secondary | ICD-10-CM | POA: Diagnosis not present

## 2016-06-22 DIAGNOSIS — E1143 Type 2 diabetes mellitus with diabetic autonomic (poly)neuropathy: Secondary | ICD-10-CM | POA: Diagnosis not present

## 2016-06-22 DIAGNOSIS — Z515 Encounter for palliative care: Secondary | ICD-10-CM

## 2016-06-22 DIAGNOSIS — Z452 Encounter for adjustment and management of vascular access device: Secondary | ICD-10-CM

## 2016-06-22 DIAGNOSIS — Z89512 Acquired absence of left leg below knee: Secondary | ICD-10-CM | POA: Insufficient documentation

## 2016-06-22 DIAGNOSIS — X58XXXA Exposure to other specified factors, initial encounter: Secondary | ICD-10-CM | POA: Insufficient documentation

## 2016-06-22 DIAGNOSIS — E1111 Type 2 diabetes mellitus with ketoacidosis with coma: Secondary | ICD-10-CM | POA: Diagnosis not present

## 2016-06-22 DIAGNOSIS — E1122 Type 2 diabetes mellitus with diabetic chronic kidney disease: Secondary | ICD-10-CM | POA: Diagnosis not present

## 2016-06-22 DIAGNOSIS — K3184 Gastroparesis: Secondary | ICD-10-CM | POA: Insufficient documentation

## 2016-06-22 DIAGNOSIS — I4721 Torsades de pointes: Secondary | ICD-10-CM

## 2016-06-22 DIAGNOSIS — R4182 Altered mental status, unspecified: Secondary | ICD-10-CM | POA: Diagnosis present

## 2016-06-22 LAB — POC OCCULT BLOOD, ED: FECAL OCCULT BLD: POSITIVE — AB

## 2016-06-22 MED ORDER — DEXTROSE-NACL 5-0.45 % IV SOLN: INTRAVENOUS | Status: DC

## 2016-06-22 MED ORDER — SODIUM CHLORIDE 0.9 % IV BOLUS (SEPSIS)
2000.0000 mL | Freq: Once | INTRAVENOUS | Status: AC
  Administered 2016-06-22: 2000 mL via INTRAVENOUS

## 2016-06-22 MED ORDER — SODIUM CHLORIDE 0.9 % IV SOLN
INTRAVENOUS | Status: DC
  Filled 2016-06-22: qty 2.5

## 2016-06-22 MED ORDER — LACTATED RINGERS IV SOLN
INTRAVENOUS | Status: DC
  Administered 2016-06-23: 01:00:00 via INTRAVENOUS

## 2016-06-22 MED ORDER — SODIUM CHLORIDE 0.9 % IV SOLN
INTRAVENOUS | Status: DC
  Administered 2016-06-23: 5.4 [IU]/h via INTRAVENOUS
  Filled 2016-06-22: qty 2.5

## 2016-06-22 NOTE — ED Provider Notes (Signed)
[facility] DEPT Provider Note   CSN: [id number] Arrival date & time:      By signing my name below, I, Avnee Patel, attest that this documentation has been prepared under the direction and in the presence of Varney Biles, MD  Electronically Signed: Delton [place], ED Scribe. 06/13/2016. 11:53 PM.   History   Chief Complaint Chief Complaint  Patient presents with  . Hyperglycemia  . Altered Mental Status   The history is provided by the EMS personnel. No language interpreter was used.   HPI Comments: LEVEL 5 CAVEAT DUE TO UNRESPONSIVENESS   HPI Comments:  [name] is a 51 y.o. male, with a PMHx of DM and DKA, who presents to the Emergency Department, via EMS, after being found unresponsive on the bathroom floor by the pt's cousin PTA. Per EMS personnel, the pt's cousin noted that at baseline the pt is able to do his daily task by himself. Pt was last seen normal at 6 AM today. EMS states they found mucous and blood in a trash can near the pt.  No past medical history on file.  Patient Active Problem List   Diagnosis Date Noted  . GI bleed 06/23/2016    No past surgical history on file.   Home Medications    Prior to Admission medications   Not on File    Family History No family history on file.  Social History Social History  Substance Use Topics  . Smoking status: Not on file  . Smokeless tobacco: Not on file  . Alcohol use Not on file     Allergies   Patient has no allergy information on record.   Review of Systems Review of Systems  Unable to perform ROS: Patient unresponsive   Physical Exam Updated Vital Signs BP (!) 120/58   Pulse (!) 109   Temp (!) 96.1 F (35.6 C) (Core (Comment))   Resp (!) 34   SpO2 100%   Physical Exam  HENT:  Mouth/Throat: Mucous membranes are dry.  No gag reflex  Mucous membranes appear dry   Eyes:  Pinpoint pupils. No forced gaze   Abdominal: Soft.  Nursing note and vitals reviewed.   ED Treatments  / Results  Labs (all labs ordered are listed, but only abnormal results are displayed) Labs Reviewed  CBC WITH DIFFERENTIAL/PLATELET - Abnormal; Notable for the following:       Result Value   WBC 29.3 (*)    RBC 2.22 (*)    Hemoglobin 6.6 (*)    HCT 21.8 (*)    Platelets 459 (*)    Neutro Abs 26.3 (*)    Monocytes Absolute 1.5 (*)    All other components within normal limits  BETA-HYDROXYBUTYRIC ACID - Abnormal; Notable for the following:    Beta-Hydroxybutyric Acid 1.80 (*)    All other components within normal limits  URINALYSIS, ROUTINE W REFLEX MICROSCOPIC - Abnormal; Notable for the following:    Color, Urine STRAW (*)    Glucose, UA >=500 (*)    Squamous Epithelial / LPF 0-5 (*)    All other components within normal limits  TROPONIN I - Abnormal; Notable for the following:    Troponin I 0.07 (*)    All other components within normal limits  MAGNESIUM - Abnormal; Notable for the following:    Magnesium 2.6 (*)    All other components within normal limits  PHOSPHORUS - Abnormal; Notable for the following:    Phosphorus 11.5 (*)  All other components within normal limits  RAPID URINE DRUG SCREEN, HOSP PERFORMED - Abnormal; Notable for the following:    Benzodiazepines POSITIVE (*)    All other components within normal limits  LACTIC ACID, PLASMA - Abnormal; Notable for the following:    Lactic Acid, Venous 19.5 (*)    All other components within normal limits  PROTIME-INR - Abnormal; Notable for the following:    Prothrombin Time 19.6 (*)    All other components within normal limits  DIC (DISSEMINATED INTRAVASCULAR COAGULATION) PANEL - Abnormal; Notable for the following:    Prothrombin Time 19.1 (*)    D-Dimer, Quant 2.56 (*)    Platelets 441 (*)    All other components within normal limits  COMPREHENSIVE METABOLIC PANEL - Abnormal; Notable for the following:    Sodium 114 (*)    Chloride 68 (*)    CO2 8 (*)    Glucose, Bld 1,361 (*)    BUN 113 (*)     Creatinine, Ser 3.67 (*)    Calcium 7.9 (*)    Total Protein 5.2 (*)    Albumin 2.9 (*)    GFR calc non Af Amer 9 (*)    GFR calc Af Amer 11 (*)    Anion gap 38 (*)    All other components within normal limits  CBC - Abnormal; Notable for the following:    WBC 30.7 (*)    RBC 2.72 (*)    Hemoglobin 8.1 (*)    HCT 25.9 (*)    All other components within normal limits  BASIC METABOLIC PANEL - Abnormal; Notable for the following:    Sodium 125 (*)    Chloride 86 (*)    CO2 8 (*)    Glucose, Bld 1,033 (*)    BUN 103 (*)    Creatinine, Ser 3.34 (*)    Calcium 6.6 (*)    GFR calc non Af Amer 20 (*)    GFR calc Af Amer 23 (*)    Anion gap 31 (*)    All other components within normal limits  CBC - Abnormal; Notable for the following:    WBC 33.3 (*)    RBC 3.09 (*)    Hemoglobin 8.8 (*)    HCT 25.4 (*)    All other components within normal limits  BASIC METABOLIC PANEL - Abnormal; Notable for the following:    Sodium 127 (*)    Chloride 90 (*)    CO2 13 (*)    Glucose, Bld 826 (*)    BUN 104 (*)    Creatinine, Ser 3.45 (*)    Calcium 6.6 (*)    GFR calc non Af Amer 19 (*)    GFR calc Af Amer 22 (*)    Anion gap 24 (*)    All other components within normal limits  BASIC METABOLIC PANEL - Abnormal; Notable for the following:    Sodium 129 (*)    Potassium 3.3 (*)    Chloride 86 (*)    CO2 17 (*)    Glucose, Bld 669 (*)    BUN 110 (*)    Creatinine, Ser 3.43 (*)    Calcium 6.9 (*)    GFR calc non Af Amer 19 (*)    GFR calc Af Amer 22 (*)    Anion gap 26 (*)    All other components within normal limits  TROPONIN I - Abnormal; Notable for the following:    Troponin I 3.43 (*)  All other components within normal limits  LACTIC ACID, PLASMA - Abnormal; Notable for the following:    Lactic Acid, Venous 9.0 (*)    All other components within normal limits  LIPASE, BLOOD - Abnormal; Notable for the following:    Lipase 58 (*)    All other components within normal  limits  BLOOD GAS, ARTERIAL - Abnormal; Notable for the following:    pH, Arterial 7.321 (*)    pCO2 arterial 29.2 (*)    Bicarbonate 14.9 (*)    Acid-base deficit 10.2 (*)    All other components within normal limits  GLUCOSE, CAPILLARY - Abnormal; Notable for the following:    Glucose-Capillary >600 (*)    All other components within normal limits  GLUCOSE, CAPILLARY - Abnormal; Notable for the following:    Glucose-Capillary >600 (*)    All other components within normal limits  GLUCOSE, CAPILLARY - Abnormal; Notable for the following:    Glucose-Capillary 517 (*)    All other components within normal limits  I-STAT ARTERIAL BLOOD GAS, ED - Abnormal; Notable for the following:    pH, Arterial 6.887 (*)    pO2, Arterial 425.0 (*)    Bicarbonate 7.8 (*)    Acid-base deficit 25.0 (*)    All other components within normal limits  POC OCCULT BLOOD, ED - Abnormal; Notable for the following:    Fecal Occult Bld POSITIVE (*)    All other components within normal limits  CBG MONITORING, ED - Abnormal; Notable for the following:    Glucose-Capillary >600 (*)    All other components within normal limits  CBG MONITORING, ED - Abnormal; Notable for the following:    Glucose-Capillary >600 (*)    All other components within normal limits  URINE CULTURE  CULTURE, BLOOD (ROUTINE X 2)  CULTURE, BLOOD (ROUTINE X 2)  CULTURE, RESPIRATORY (NON-EXPECTORATED)  URINE CULTURE  CK  FIBRINOGEN  APTT  TRIGLYCERIDES  CBC  CBC  MYOGLOBIN, URINE  HEPATITIS PANEL, ACUTE  BASIC METABOLIC PANEL  BASIC METABOLIC PANEL  BASIC METABOLIC PANEL  BASIC METABOLIC PANEL  TROPONIN I  TROPONIN I  LACTIC ACID, PLASMA  I-STAT CHEM 8, ED  I-STAT CG4 LACTIC ACID, ED  TYPE AND SCREEN  PREPARE RBC (CROSSMATCH)  ABO/RH  PREPARE RBC (CROSSMATCH)  PREPARE FRESH FROZEN PLASMA  PREPARE PLATELET PHERESIS    EKG  EKG Interpretation  Date/Time:  Friday June 23 2016 00:40:59 EDT Ventricular Rate:  86 PR  Interval:    QRS Duration: 119 QT Interval:  395 QTC Calculation: 473 R Axis:   -40 Text Interpretation:  Sinus rhythm Left anterior fascicular block Left ventricular hypertrophy Nonspecific ST and T wave abnormality ST elevation in avL, V2 and lead I - not fitting any specific vascular territory and likely J point elevation Confirmed by Kathrynn Humble, MD, Thelma Comp [phone]) on 06/23/2016 1:00:33 AM       Radiology Ct Abdomen Pelvis Wo Contrast  Result Date: 06/23/2016 CLINICAL DATA:  Patient was found unresponsive and respiratory arrest at home. Seizure-like activity. GI bleeding from the rectum, nose, and mouth. EXAM: CT CHEST, ABDOMEN AND PELVIS WITHOUT CONTRAST TECHNIQUE: Multidetector CT imaging of the chest, abdomen and pelvis was performed following the standard protocol without IV contrast. COMPARISON:  None. FINDINGS: CT CHEST FINDINGS Cardiovascular: Normal heart size. No pericardial effusion. Normal caliber thoracic aorta. Mediastinum/Nodes: Endotracheal and enteric tubes are present. A right central venous catheter with tip in the low SVC. Esophagus is decompressed. No significant lymphadenopathy in  the chest. Lungs/Pleura: Evaluation of lungs is limited due to respiratory motion artifact. Areas of consolidation or atelectasis demonstrated in the lung bases. No pleural effusions. No pneumothorax. Mild emphysematous changes in the lung apices. Musculoskeletal: No chest wall mass or suspicious bone lesions identified. CT ABDOMEN PELVIS FINDINGS Hepatobiliary: Increased density in the gallbladder may indicate sludge. No bile duct dilatation. No focal liver lesions. Pancreas: Unremarkable. No pancreatic ductal dilatation or surrounding inflammatory changes. Spleen: Normal in size without focal abnormality. Adrenals/Urinary Tract: Surgical absence of the left kidney. No hydronephrosis or hydroureter on the right. Calcification in the adrenal glands, likely representing evidence of old hemorrhage or  infection. Mild diffuse thickening of the bladder wall could indicate cystitis or bladder outlet obstruction. Stomach/Bowel: Colon is mildly distended and fluid filled with air-fluid levels. Transition zone is seen in the mid sigmoid region. There is wall thickening suggesting stricture or inflammatory bowel disease. Colonic neoplasm not entirely excluded. Small amount of portal venous gas is suggested in the pelvis adjacent to the colon. Focal ischemia or micro perforation could have this appearance. Fluid-filled small bowel without significant dilatation. Stomach is decompressed. Small ventral abdominal wall hernia containing a portion of the colon but without obstruction at this level. Vascular/Lymphatic: No significant vascular findings are present. No enlarged abdominal or pelvic lymph nodes. Reproductive: Prostate is unremarkable. Other: No free air or free fluid demonstrated in the abdomen. Musculoskeletal: No acute or significant osseous findings. IMPRESSION: Atelectasis or infiltration demonstrated in both lung bases. Colonic dilatation with fluid-filled loops. Transition zone in the mid sigmoid region where there is an area of wall thickening and mild adjacent portal venous gas. Changes may represent stricture, inflammatory bowel disease, focal ischemia, or neoplasm. Fluid-filled small bowel without significant dilatation. Bladder wall thickening may indicate cystitis or outlet obstruction. Surgical absence of the left kidney. These results were called by telephone at the time of interpretation on 06/23/2016 at 5:07 am to Dr. [name] , who verbally acknowledged these results. Electronically Signed   By: Lucienne Capers M.D.   On: 06/23/2016 05:09   Ct Head Wo Contrast  Result Date: 06/23/2016 CLINICAL DATA:  Found unresponsive, seizure like activity. GI bleed. EXAM: CT HEAD WITHOUT CONTRAST TECHNIQUE: Contiguous axial images were obtained from the base of the skull through the vertex without  intravenous contrast. COMPARISON:  None. FINDINGS: Multiple lines overlying the patient's head result in streak artifact. BRAIN: No intraparenchymal hemorrhage, mass effect nor midline shift. The ventricles and sulci are normal. No acute large vascular territory infarcts. Faint linear hypodensity RIGHT frontoparietal lobes. No abnormal extra-axial fluid collections. Basal cisterns are patent. VASCULAR: Unremarkable. SKULL/SOFT TISSUES: No skull fracture. No significant soft tissue swelling. Small RIGHT frontal scalp lipoma. ORBITS/SINUSES: Proptosis. Old RIGHT medial orbital blowout fracture. Mild paranasal sinus mucosal thickening. Small RIGHT mastoid effusion. OTHER: Life-support lines in place. IMPRESSION: Linear RIGHT frontoparietal hypodensity could represent artifact, acute infarct or, cerebritis. These results will be called to the ordering clinician or representative by the Radiologist Assistant, and communication documented in the [facility] Dashboard. Electronically Signed   By: Elon Alas M.D.   On: 06/23/2016 04:56   Ct Chest Wo Contrast  Result Date: 06/23/2016 CLINICAL DATA:  Patient was found unresponsive and respiratory arrest at home. Seizure-like activity. GI bleeding from the rectum, nose, and mouth. EXAM: CT CHEST, ABDOMEN AND PELVIS WITHOUT CONTRAST TECHNIQUE: Multidetector CT imaging of the chest, abdomen and pelvis was performed following the standard protocol without IV contrast. COMPARISON:  None. FINDINGS: CT CHEST  FINDINGS Cardiovascular: Normal heart size. No pericardial effusion. Normal caliber thoracic aorta. Mediastinum/Nodes: Endotracheal and enteric tubes are present. A right central venous catheter with tip in the low SVC. Esophagus is decompressed. No significant lymphadenopathy in the chest. Lungs/Pleura: Evaluation of lungs is limited due to respiratory motion artifact. Areas of consolidation or atelectasis demonstrated in the lung bases. No pleural effusions. No  pneumothorax. Mild emphysematous changes in the lung apices. Musculoskeletal: No chest wall mass or suspicious bone lesions identified. CT ABDOMEN PELVIS FINDINGS Hepatobiliary: Increased density in the gallbladder may indicate sludge. No bile duct dilatation. No focal liver lesions. Pancreas: Unremarkable. No pancreatic ductal dilatation or surrounding inflammatory changes. Spleen: Normal in size without focal abnormality. Adrenals/Urinary Tract: Surgical absence of the left kidney. No hydronephrosis or hydroureter on the right. Calcification in the adrenal glands, likely representing evidence of old hemorrhage or infection. Mild diffuse thickening of the bladder wall could indicate cystitis or bladder outlet obstruction. Stomach/Bowel: Colon is mildly distended and fluid filled with air-fluid levels. Transition zone is seen in the mid sigmoid region. There is wall thickening suggesting stricture or inflammatory bowel disease. Colonic neoplasm not entirely excluded. Small amount of portal venous gas is suggested in the pelvis adjacent to the colon. Focal ischemia or micro perforation could have this appearance. Fluid-filled small bowel without significant dilatation. Stomach is decompressed. Small ventral abdominal wall hernia containing a portion of the colon but without obstruction at this level. Vascular/Lymphatic: No significant vascular findings are present. No enlarged abdominal or pelvic lymph nodes. Reproductive: Prostate is unremarkable. Other: No free air or free fluid demonstrated in the abdomen. Musculoskeletal: No acute or significant osseous findings. IMPRESSION: Atelectasis or infiltration demonstrated in both lung bases. Colonic dilatation with fluid-filled loops. Transition zone in the mid sigmoid region where there is an area of wall thickening and mild adjacent portal venous gas. Changes may represent stricture, inflammatory bowel disease, focal ischemia, or neoplasm. Fluid-filled small bowel  without significant dilatation. Bladder wall thickening may indicate cystitis or outlet obstruction. Surgical absence of the left kidney. These results were called by telephone at the time of interpretation on 06/23/2016 at 5:07 am to Dr. [name] , who verbally acknowledged these results. Electronically Signed   By: Lucienne Capers M.D.   On: 06/23/2016 05:09   Dg Chest Port 1 View  Result Date: 06/23/2016 CLINICAL DATA:  Central line placement and intubation. EXAM: PORTABLE CHEST 1 VIEW COMPARISON:  06/14/2016 FINDINGS: Endotracheal tube with tip is at the lower cervical region, measuring about 8.7 cm above the carina. Right central venous catheter has been placed with tip over the low SVC region. No visible pneumothorax. Enteric tube with tip in the left upper quadrant consistent with location in the stomach. Normal heart size and pulmonary vascularity. Shallow inspiration. No focal airspace disease or consolidation in the lungs. No blunting of costophrenic angles. IMPRESSION: Endotracheal tube is at the lower cervical region, measuring 8.7 cm from the carina. Appliances otherwise in satisfactory position. No evidence of active pulmonary disease. Electronically Signed   By: Lucienne Capers M.D.   On: 06/23/2016 03:01   Dg Chest Portable 1 View  Result Date: 06/27/2016 CLINICAL DATA:  Altered mental status.  Hypertension. EXAM: PORTABLE CHEST 1 VIEW COMPARISON:  None. FINDINGS: Shallow inspiration. Normal heart size and pulmonary vascularity. No focal airspace disease or consolidation in the lungs. No blunting of costophrenic angles. No pneumothorax. Mediastinal contours appear intact. IMPRESSION: No active disease. Electronically Signed   By: Oren Beckmann.D.  On: 06/11/2016 23:58    Procedures ARTERIAL LINE Date/Time: 06/23/2016 9:22 AM Performed by: Varney Biles Authorized by: Varney Biles   Consent:    Consent obtained:  Emergent situation Indications:    Indications:  hemodynamic monitoring and multiple ABGs   Pre-procedure details:    Preparation: Patient was prepped and draped in sterile fashion   Anesthesia (see MAR for exact dosages):    Anesthesia method:  Local infiltration Procedure details:    Location:  L femoral   Needle gauge:  18 G   Placement technique:  Seldinger   Number of attempts:  1 Post-procedure details:    Post-procedure:  Biopatch applied, secured with tape, sterile dressing applied and sutured   Patient tolerance of procedure:  Tolerated well, no immediate complications   (including critical care time)  Cardiopulmonary Resuscitation (CPR) Procedure Note Directed/Performed by: Varney Biles I personally directed ancillary staff and/or performed CPR in an effort to regain return of spontaneous circulation and to maintain cardiac, neuro and systemic perfusion.    CRITICAL CARE Performed by: Varney Biles   Total critical care time: 40 minutes  Critical care time was exclusive of separately billable procedures and treating other patients.  Critical care was necessary to treat or prevent imminent or life-threatening deterioration.  Critical care was time spent personally by me on the following activities: development of treatment plan with patient and/or surrogate as well as nursing, discussions with consultants, evaluation of patient's response to treatment, examination of patient, obtaining history from patient or surrogate, ordering and performing treatments and interventions, ordering and review of laboratory studies, ordering and review of radiographic studies, pulse oximetry and re-evaluation of patient's condition.    INTUBATION Performed by: Varney Biles  Required items: required blood products, implants, devices, and special equipment available Patient identity confirmed: provided demographic data and hospital-assigned identification number Time out: Immediately prior to procedure a "time out" was called  to verify the correct patient, procedure, equipment, support staff and site/side marked as required.  Indications: respiratory failure  Intubation method: DIRECT Laryngoscopy   Preoxygenation: BVM  Sedatives: Ketamine Paralytic: NONE Tube Size: 7.5  cuffed  Post-procedure assessment: chest rise and ETCO2 monitor Breath sounds: equal and absent over the epigastrium Tube secured with: ETT holder Chest x-ray interpreted by radiologist and me.  Chest x-ray findings: endotracheal tube in appropriate position  Patient tolerated the procedure well with no immediate complications.    CENTRAL LINE Performed by: Varney Biles Consent: The procedure was performed in an emergent situation. Required items: required blood products, implants, devices, and special equipment available Patient identity confirmed: arm band and provided demographic data Time out: Immediately prior to procedure a "time out" was called to verify the correct patient, procedure, equipment, support staff and site/side marked as required. Indications: vascular access Anesthesia: local infiltration Local anesthetic: lidocaine 1% with epinephrine Anesthetic total: 3 ml Patient sedated: no Preparation: skin prepped with 2% chlorhexidine Skin prep agent dried: skin prep agent completely dried prior to procedure Sterile barriers: all five maximum sterile barriers used - cap, mask, sterile gown, sterile gloves, and large sterile sheet Hand hygiene: hand hygiene performed prior to central venous catheter insertion  Location details: RIGHT SUBCLAVIAN  Catheter type: triple lumen Catheter size: 8 Fr Pre-procedure: landmarks identified Ultrasound guidance: no Successful placement: yes Post-procedure: line sutured and dressing applied Assessment: blood return through all parts, free fluid flow, placement verified by x-ray and no pneumothorax on x-ray Patient tolerance: Patient tolerated the procedure well with no  immediate  complications.   Angiocath insertion Performed by: Varney Biles  Consent: Verbal consent obtained. Risks and benefits: risks, benefits and alternatives were discussed Time out: Immediately prior to procedure a "time out" was called to verify the correct patient, procedure, equipment, support staff and site/side marked as required.  Preparation: Patient was prepped and draped in the usual sterile fashion.  Vein Location: Left EJ  Ultrasound Guided: NO  Gauge: 20  Normal blood return and flush without difficulty Patient tolerance: Patient tolerated the procedure well with no immediate complications.     Angiocath insertion Performed by: Varney Biles  Consent: Verbal consent obtained. Risks and benefits: risks, benefits and alternatives were discussed Time out: Immediately prior to procedure a "time out" was called to verify the correct patient, procedure, equipment, support staff and site/side marked as required.  Preparation: Patient was prepped and draped in the usual sterile fashion.  Vein Location: RIGHT IJ  Ultrasound Guided - No  Gauge: 20  Normal blood return and flush without difficulty Patient tolerance: Patient tolerated the procedure well with no immediate complications.      Medications Ordered in ED Medications  piperacillin-tazobactam (ZOSYN) IVPB 3.375 g (not administered)  0.9 %  sodium chloride infusion (not administered)  sodium bicarbonate 1 mEq/mL injection (not administered)  0.9 %  sodium chloride infusion (not administered)  insulin regular (NOVOLIN R,HUMULIN R) 250 Units in sodium chloride 0.9 % 250 mL (1 Units/mL) infusion (16.2 Units/hr Intravenous Rate/Dose Verify 06/23/16 0600)  ketamine 100 mg in normal saline 10 mL (6m/mL) syringe (not administered)  etomidate (AMIDATE) injection 20 mg (not administered)  Ketamine HCl-Sodium Chloride 100-0.9 MG/10ML-% SOSY (not administered)  octreotide (SANDOSTATIN) 2 mcg/mL load  via infusion 50 mcg (not administered)    And  octreotide (SANDOSTATIN) 500 mcg in sodium chloride 0.9 % 250 mL (2 mcg/mL) infusion (50 mcg/hr Intravenous New Bag/Given 06/23/16 0617)  fentaNYL (SUBLIMAZE) injection 100 mcg (100 mcg Intravenous Given 06/23/16 0327)  fentaNYL (SUBLIMAZE) injection 100 mcg (not administered)  midazolam (VERSED) injection 2 mg (2 mg Intravenous Given 06/23/16 0323)  midazolam (VERSED) injection 2 mg (2 mg Intravenous Given 06/23/16 0336)  fentaNYL (SUBLIMAZE) 100 MCG/2ML injection (not administered)  midazolam (VERSED) 2 MG/2ML injection (not administered)  midazolam (VERSED) 2 MG/2ML injection (not administered)  sodium bicarbonate 150 mEq in dextrose 5 % 1,000 mL infusion ( Intravenous Rate/Dose Verify 06/23/16 0600)  pantoprazole (PROTONIX) 80 mg in sodium chloride 0.9 % 100 mL IVPB (not administered)  pantoprazole (PROTONIX) 80 mg in sodium chloride 0.9 % 250 mL (0.32 mg/mL) infusion (8 mg/hr Intravenous Rate/Dose Verify 06/23/16 0600)  pantoprazole (PROTONIX) injection 40 mg (not administered)  0.9 %  sodium chloride infusion (not administered)  vasopressin (PITRESSIN) 40 Units in sodium chloride 0.9 % 250 mL (0.16 Units/mL) infusion (0 Units/min Intravenous Stopped 06/23/16 0652)  vancomycin (VANCOCIN) 500 mg in sodium chloride 0.9 % 100 mL IVPB (not administered)  piperacillin-tazobactam (ZOSYN) IVPB 3.375 g (not administered)  norepinephrine (LEVOPHED) 16 mg in dextrose 5 % 250 mL (0.064 mg/mL) infusion (40 mcg/min Intravenous Rate/Dose Change [id number]  folic acid injection 1 mg (not administered)  thiamine (B-1) injection 100 mg (not administered)  chlorhexidine gluconate (MEDLINE KIT) (PERIDEX) 0.12 % solution 15 mL (15 mLs Mouth Rinse Given 06/23/16 0800)  MEDLINE mouth rinse (not administered)  propofol (DIPRIVAN) 1000 MG/100ML infusion (not administered)  sodium chloride 0.9 % bolus 2,000 mL (0 mLs Intravenous Stopped 06/23/16 0037)  vancomycin  (VANCOCIN) IVPB 1000 mg/200 mL premix (0 mg  Intravenous Stopped 06/23/16 0235)  pantoprazole (PROTONIX) injection 40 mg (40 mg Intravenous Given 06/23/16 0112)  norepinephrine (LEVOPHED) 4 mg in dextrose 5 % 250 mL (0.016 mg/mL) infusion (5 mcg/min Intravenous New Bag/Given 06/23/16 0215)  magnesium sulfate (IV Push/IM) injection (2 g Intravenous Given 06/23/16 0232)  propofol (DIPRIVAN) 1000 MG/100ML infusion (  New Bag/Given 06/23/16 0348)  propofol (DIPRIVAN) 1000 AQ/762UQ infusion (  Duplicate 3/33/54 5625)     Initial Impression / Assessment and Plan / ED Course  I have reviewed the triage vital signs and the nursing notes.  Pertinent labs & imaging results that were available during my care of the patient were reviewed by me and considered in my medical decision making (see chart for details).     Patient arrived comatose. He is noted to be dry and with elevated blood surgar. DKA suspected - and treatment for it started immediately. PT profoundly dry-  RN couldn't get any IV, so I placed 2 J quickly.  We are unsure what might have led to DKA. Sepsis workup initiated.  Pt was noted to be anemic. Rectal exam revealed melena. Hb was 6.6 - we ordered blood transfusion and 40 mg iv protonix given.  Pt's BP dropped subsequently. Central line placed.  Pt then went into cardiac arrest. Bicarb given, pt intubated.  Ptwent into multi0le rounds of VT. Pt also had torsades at 1 point. Many CPR rounds and shocks were delivered.  CCM to admit. GI consulted.   Final Clinical Impressions(s) / ED Diagnoses   Final diagnoses:  Acute upper GI bleed  Cardiac arrest ([place])  Shock ([place])  Diabetic ketoacidosis with coma associated with other specified diabetes mellitus ([place])  Acute respiratory failure with hypoxia ([place])  Torsades de pointes ([place])    New Prescriptions There are no discharge medications for this patient.  I personally performed the services described in this documentation,  which was scribed in my presence. The recorded information has been reviewed and is accurate.\   Varney Biles, MD 06/23/16 [phone]

## 2016-06-22 NOTE — ED Notes (Addendum)
Charted in error.

## 2016-06-22 NOTE — ED Notes (Signed)
Pt has bilat BKA; hx of DM and DKA

## 2016-06-22 NOTE — ED Notes (Addendum)
Pt presents from home with [facility] for unresponsiveness and tachypnea; pt was found lying in bathroom floor with toilet on side by cousin who periodically checks on patient; pt was LSW this morning at 0600; pt found to have pinpoint pupils and clinched jaw; EMS also noted there was blood/mucous in trash can on scene; NPA and NRB placed by EMS; Pt only responsive painful stimuli; pt does not follow commands; no gag reflex upon arrival; no PIV access upon arrival

## 2016-06-23 ENCOUNTER — Inpatient Hospital Stay (HOSPITAL_COMMUNITY): Payer: Medicaid Other

## 2016-06-23 ENCOUNTER — Emergency Department (HOSPITAL_COMMUNITY): Payer: Medicaid Other

## 2016-06-23 DIAGNOSIS — I469 Cardiac arrest, cause unspecified: Secondary | ICD-10-CM

## 2016-06-23 DIAGNOSIS — J9600 Acute respiratory failure, unspecified whether with hypoxia or hypercapnia: Secondary | ICD-10-CM | POA: Diagnosis not present

## 2016-06-23 DIAGNOSIS — Q8789 Other specified congenital malformation syndromes, not elsewhere classified: Secondary | ICD-10-CM

## 2016-06-23 DIAGNOSIS — E1110 Type 2 diabetes mellitus with ketoacidosis without coma: Secondary | ICD-10-CM

## 2016-06-23 DIAGNOSIS — K922 Gastrointestinal hemorrhage, unspecified: Secondary | ICD-10-CM

## 2016-06-23 DIAGNOSIS — G9340 Encephalopathy, unspecified: Secondary | ICD-10-CM

## 2016-06-23 DIAGNOSIS — N179 Acute kidney failure, unspecified: Secondary | ICD-10-CM

## 2016-06-23 DIAGNOSIS — R578 Other shock: Secondary | ICD-10-CM | POA: Diagnosis not present

## 2016-06-23 DIAGNOSIS — G931 Anoxic brain damage, not elsewhere classified: Secondary | ICD-10-CM

## 2016-06-23 DIAGNOSIS — J9601 Acute respiratory failure with hypoxia: Secondary | ICD-10-CM

## 2016-06-23 LAB — POCT I-STAT, CHEM 8
BUN: 110 mg/dL — AB (ref 6–20)
CALCIUM ION: 0.89 mmol/L — AB (ref 1.15–1.40)
CHLORIDE: 71 mmol/L — AB (ref 101–111)
Creatinine, Ser: 3.3 mg/dL — ABNORMAL HIGH (ref 0.61–1.24)
HCT: 22 % — ABNORMAL LOW (ref 39.0–52.0)
Hemoglobin: 7.5 g/dL — ABNORMAL LOW (ref 13.0–17.0)
POTASSIUM: 4.8 mmol/L (ref 3.5–5.1)
SODIUM: 109 mmol/L — AB (ref 135–145)
TCO2: 12 mmol/L (ref 0–100)

## 2016-06-23 LAB — CBC WITH DIFFERENTIAL/PLATELET
Basophils Absolute: 0 10*3/uL (ref 0.0–0.1)
Basophils Relative: 0 %
EOS PCT: 0 %
Eosinophils Absolute: 0 10*3/uL (ref 0.0–0.7)
HEMATOCRIT: 21.8 % — AB (ref 39.0–52.0)
HEMOGLOBIN: 6.6 g/dL — AB (ref 13.0–17.0)
LYMPHS ABS: 1.5 10*3/uL (ref 0.7–4.0)
Lymphocytes Relative: 5 %
MCH: 29.7 pg (ref 26.0–34.0)
MCHC: 30.3 g/dL (ref 30.0–36.0)
MCV: 98.2 fL (ref 78.0–100.0)
MONOS PCT: 5 %
Monocytes Absolute: 1.5 10*3/uL — ABNORMAL HIGH (ref 0.1–1.0)
NEUTROS ABS: 26.3 10*3/uL — AB (ref 1.7–7.7)
Neutrophils Relative %: 90 %
PLATELETS: 459 10*3/uL — AB (ref 150–400)
RBC: 2.22 MIL/uL — AB (ref 4.22–5.81)
RDW: 13.6 % (ref 11.5–15.5)
WBC: 29.3 10*3/uL — ABNORMAL HIGH (ref 4.0–10.5)

## 2016-06-23 LAB — CBG MONITORING, ED: Glucose-Capillary: >600 mg/dL (ref 65–99)

## 2016-06-23 LAB — LIPASE, BLOOD: LIPASE: 58 U/L — AB (ref 11–51)

## 2016-06-23 LAB — CBC
HCT: 25.9 % — ABNORMAL LOW (ref 39.0–52.0)
HCT: 25.4 % — ABNORMAL LOW (ref 39.0–52.0)
HEMATOCRIT: 24.4 % — AB (ref 39.0–52.0)
HEMOGLOBIN: 8.8 g/dL — AB (ref 13.0–17.0)
HEMOGLOBIN: 9 g/dL — AB (ref 13.0–17.0)
Hemoglobin: 8.1 g/dL — ABNORMAL LOW (ref 13.0–17.0)
MCH: 29.8 pg (ref 26.0–34.0)
MCH: 28.5 pg (ref 26.0–34.0)
MCH: 29.4 pg (ref 26.0–34.0)
MCHC: 31.3 g/dL (ref 30.0–36.0)
MCHC: 34.6 g/dL (ref 30.0–36.0)
MCHC: 36.9 g/dL — ABNORMAL HIGH (ref 30.0–36.0)
MCV: 95.2 fL (ref 78.0–100.0)
MCV: 82.2 fL (ref 78.0–100.0)
MCV: 79.7 fL (ref 78.0–100.0)
PLATELETS: 197 10*3/uL (ref 150–400)
PLATELETS: 376 10*3/uL (ref 150–400)
Platelets: 300 10*3/uL (ref 150–400)
RBC: 2.72 MIL/uL — ABNORMAL LOW (ref 4.22–5.81)
RBC: 3.09 MIL/uL — AB (ref 4.22–5.81)
RBC: 3.06 MIL/uL — AB (ref 4.22–5.81)
RDW: 14 % (ref 11.5–15.5)
RDW: 12.5 % (ref 11.5–15.5)
RDW: 12.8 % (ref 11.5–15.5)
WBC: 30.7 10*3/uL — ABNORMAL HIGH (ref 4.0–10.5)
WBC: 33.3 10*3/uL — AB (ref 4.0–10.5)
WBC: 34.1 10*3/uL — AB (ref 4.0–10.5)

## 2016-06-23 LAB — GLUCOSE, CAPILLARY
GLUCOSE-CAPILLARY: 517 mg/dL — AB (ref 65–99)
GLUCOSE-CAPILLARY: 440 mg/dL — AB (ref 65–99)
GLUCOSE-CAPILLARY: 337 mg/dL — AB (ref 65–99)
GLUCOSE-CAPILLARY: 153 mg/dL — AB (ref 65–99)
GLUCOSE-CAPILLARY: 129 mg/dL — AB (ref 65–99)
Glucose-Capillary: >600 mg/dL (ref 65–99)
Glucose-Capillary: >600 mg/dL (ref 65–99)
Glucose-Capillary: 248 mg/dL — ABNORMAL HIGH (ref 65–99)
Glucose-Capillary: 206 mg/dL — ABNORMAL HIGH (ref 65–99)
Glucose-Capillary: 106 mg/dL — ABNORMAL HIGH (ref 65–99)
Glucose-Capillary: 121 mg/dL — ABNORMAL HIGH (ref 65–99)

## 2016-06-23 LAB — TROPONIN I
TROPONIN I: 0.07 ng/mL — AB (ref <0.03)
Troponin I: 3.43 ng/mL (ref <0.03)
Troponin I: 64.29 ng/mL (ref <0.03)

## 2016-06-23 LAB — BLOOD GAS, ARTERIAL
Acid-base deficit: 10.2 mmol/L — ABNORMAL HIGH (ref 0.0–2.0)
Bicarbonate: 14.9 mmol/L — ABNORMAL LOW (ref 20.0–28.0)
Drawn by: 44135
FIO2: 40
MECHVT: 500 mL
O2 SAT: 97.1 %
PEEP: 5 cmH2O
PH ART: 7.321 — AB (ref 7.350–7.450)
Patient temperature: 96.1
RATE: 24 resp/min
pCO2 arterial: 29.2 mmHg — ABNORMAL LOW (ref 32.0–48.0)
pO2, Arterial: 102 mmHg (ref 83.0–108.0)

## 2016-06-23 LAB — BASIC METABOLIC PANEL
ANION GAP: 18 — AB (ref 5–15)
ANION GAP: 18 — AB (ref 5–15)
ANION GAP: 15 (ref 5–15)
Anion gap: 31 — ABNORMAL HIGH (ref 5–15)
Anion gap: 24 — ABNORMAL HIGH (ref 5–15)
Anion gap: 26 — ABNORMAL HIGH (ref 5–15)
Anion gap: 22 — ABNORMAL HIGH (ref 5–15)
BUN: 103 mg/dL — AB (ref 6–20)
BUN: 104 mg/dL — AB (ref 6–20)
BUN: 110 mg/dL — ABNORMAL HIGH (ref 6–20)
BUN: 109 mg/dL — ABNORMAL HIGH (ref 6–20)
BUN: 109 mg/dL — ABNORMAL HIGH (ref 6–20)
BUN: 111 mg/dL — ABNORMAL HIGH (ref 6–20)
BUN: 110 mg/dL — ABNORMAL HIGH (ref 6–20)
CHLORIDE: 90 mmol/L — AB (ref 101–111)
CHLORIDE: 86 mmol/L — AB (ref 101–111)
CHLORIDE: 88 mmol/L — AB (ref 101–111)
CHLORIDE: 93 mmol/L — AB (ref 101–111)
CHLORIDE: 91 mmol/L — AB (ref 101–111)
CHLORIDE: 93 mmol/L — AB (ref 101–111)
CO2: 8 mmol/L — ABNORMAL LOW (ref 22–32)
CO2: 13 mmol/L — AB (ref 22–32)
CO2: 17 mmol/L — AB (ref 22–32)
CO2: 20 mmol/L — AB (ref 22–32)
CO2: 23 mmol/L (ref 22–32)
CO2: 26 mmol/L (ref 22–32)
CO2: 27 mmol/L (ref 22–32)
CREATININE: 3.34 mg/dL — AB (ref 0.61–1.24)
CREATININE: 3.45 mg/dL — AB (ref 0.61–1.24)
CREATININE: 3.43 mg/dL — AB (ref 0.61–1.24)
CREATININE: 3.57 mg/dL — AB (ref 0.61–1.24)
CREATININE: 3.62 mg/dL — AB (ref 0.61–1.24)
CREATININE: 3.78 mg/dL — AB (ref 0.61–1.24)
CREATININE: 3.88 mg/dL — AB (ref 0.61–1.24)
Calcium: 6.6 mg/dL — ABNORMAL LOW (ref 8.9–10.3)
Calcium: 6.6 mg/dL — ABNORMAL LOW (ref 8.9–10.3)
Calcium: 6.9 mg/dL — ABNORMAL LOW (ref 8.9–10.3)
Calcium: 6.8 mg/dL — ABNORMAL LOW (ref 8.9–10.3)
Calcium: 6.9 mg/dL — ABNORMAL LOW (ref 8.9–10.3)
Calcium: 6.8 mg/dL — ABNORMAL LOW (ref 8.9–10.3)
Calcium: 6.8 mg/dL — ABNORMAL LOW (ref 8.9–10.3)
Chloride: 86 mmol/L — ABNORMAL LOW (ref 101–111)
GFR calc Af Amer: 23 mL/min — ABNORMAL LOW (ref >60)
GFR calc Af Amer: 22 mL/min — ABNORMAL LOW (ref >60)
GFR calc Af Amer: 21 mL/min — ABNORMAL LOW (ref >60)
GFR calc non Af Amer: 20 mL/min — ABNORMAL LOW (ref >60)
GFR calc non Af Amer: 19 mL/min — ABNORMAL LOW (ref >60)
GFR calc non Af Amer: 19 mL/min — ABNORMAL LOW (ref >60)
GFR calc non Af Amer: 18 mL/min — ABNORMAL LOW (ref >60)
GFR calc non Af Amer: 18 mL/min — ABNORMAL LOW (ref >60)
GFR calc non Af Amer: 17 mL/min — ABNORMAL LOW (ref >60)
GFR calc non Af Amer: 17 mL/min — ABNORMAL LOW (ref >60)
GFR, EST AFRICAN AMERICAN: 22 mL/min — AB (ref >60)
GFR, EST AFRICAN AMERICAN: 21 mL/min — AB (ref >60)
GFR, EST AFRICAN AMERICAN: 20 mL/min — AB (ref >60)
GFR, EST AFRICAN AMERICAN: 19 mL/min — AB (ref >60)
Glucose, Bld: 1033 mg/dL (ref 65–99)
Glucose, Bld: 826 mg/dL (ref 65–99)
Glucose, Bld: 669 mg/dL (ref 65–99)
Glucose, Bld: 445 mg/dL — ABNORMAL HIGH (ref 65–99)
Glucose, Bld: 197 mg/dL — ABNORMAL HIGH (ref 65–99)
Glucose, Bld: 121 mg/dL — ABNORMAL HIGH (ref 65–99)
Glucose, Bld: 121 mg/dL — ABNORMAL HIGH (ref 65–99)
POTASSIUM: 3.5 mmol/L (ref 3.5–5.1)
POTASSIUM: 3.3 mmol/L — AB (ref 3.5–5.1)
POTASSIUM: 3.1 mmol/L — AB (ref 3.5–5.1)
POTASSIUM: 3.2 mmol/L — AB (ref 3.5–5.1)
POTASSIUM: 3.3 mmol/L — AB (ref 3.5–5.1)
Potassium: 3.6 mmol/L (ref 3.5–5.1)
Potassium: 3.3 mmol/L — ABNORMAL LOW (ref 3.5–5.1)
SODIUM: 125 mmol/L — AB (ref 135–145)
SODIUM: 130 mmol/L — AB (ref 135–145)
Sodium: 127 mmol/L — ABNORMAL LOW (ref 135–145)
Sodium: 129 mmol/L — ABNORMAL LOW (ref 135–145)
Sodium: 134 mmol/L — ABNORMAL LOW (ref 135–145)
Sodium: 135 mmol/L (ref 135–145)
Sodium: 135 mmol/L (ref 135–145)

## 2016-06-23 LAB — CK: Total CK: 322 U/L (ref 49–397)

## 2016-06-23 LAB — DIC (DISSEMINATED INTRAVASCULAR COAGULATION) PANEL
APTT: 28 s (ref 24–36)
FIBRINOGEN: 219 mg/dL (ref 210–475)
INR: 1.59
PLATELETS: 441 10*3/uL — AB (ref 150–400)

## 2016-06-23 LAB — COMPREHENSIVE METABOLIC PANEL
ALT: 22 U/L (ref 17–63)
AST: 36 U/L (ref 15–41)
Albumin: 2.9 g/dL — ABNORMAL LOW (ref 3.5–5.0)
Alkaline Phosphatase: 73 U/L (ref 38–126)
Anion gap: 38 — ABNORMAL HIGH (ref 5–15)
BILIRUBIN TOTAL: 0.5 mg/dL (ref 0.3–1.2)
BUN: 113 mg/dL — AB (ref 6–20)
CHLORIDE: 68 mmol/L — AB (ref 101–111)
CO2: 8 mmol/L — ABNORMAL LOW (ref 22–32)
CREATININE: 3.67 mg/dL — AB (ref 0.61–1.24)
Calcium: 7.9 mg/dL — ABNORMAL LOW (ref 8.9–10.3)
GFR, EST AFRICAN AMERICAN: 11 mL/min — AB (ref >60)
GFR, EST NON AFRICAN AMERICAN: 9 mL/min — AB (ref >60)
Glucose, Bld: 1361 mg/dL (ref 65–99)
Potassium: 5.1 mmol/L (ref 3.5–5.1)
Sodium: 114 mmol/L — CL (ref 135–145)
Total Protein: 5.2 g/dL — ABNORMAL LOW (ref 6.5–8.1)

## 2016-06-23 LAB — LACTIC ACID, PLASMA
LACTIC ACID, VENOUS: 19.5 mmol/L — AB (ref 0.5–1.9)
LACTIC ACID, VENOUS: 9 mmol/L — AB (ref 0.5–1.9)
LACTIC ACID, VENOUS: 2.7 mmol/L — AB (ref 0.5–1.9)
Lactic Acid, Venous: 3.5 mmol/L (ref 0.5–1.9)

## 2016-06-23 LAB — I-STAT ARTERIAL BLOOD GAS, ED
Acid-base deficit: 25 mmol/L — ABNORMAL HIGH (ref 0.0–2.0)
Bicarbonate: 7.8 mmol/L — ABNORMAL LOW (ref 20.0–28.0)
O2 SAT: 100 %
PCO2 ART: 38.1 mmHg (ref 32.0–48.0)
PO2 ART: 425 mmHg — AB (ref 83.0–108.0)
Patient temperature: 91.1
TCO2: 9 mmol/L (ref 0–100)
pH, Arterial: 6.887 — CL (ref 7.350–7.450)

## 2016-06-23 LAB — POCT I-STAT 3, ART BLOOD GAS (G3+)
ACID-BASE DEFICIT: 19 mmol/L — AB (ref 0.0–2.0)
Acid-Base Excess: 5 mmol/L — ABNORMAL HIGH (ref 0.0–2.0)
BICARBONATE: 9.9 mmol/L — AB (ref 20.0–28.0)
Bicarbonate: 29.3 mmol/L — ABNORMAL HIGH (ref 20.0–28.0)
O2 Saturation: 100 %
O2 Saturation: 96 %
PCO2 ART: 42.4 mmHg (ref 32.0–48.0)
PH ART: 7.449 (ref 7.350–7.450)
PO2 ART: 358 mmHg — AB (ref 83.0–108.0)
TCO2: 11 mmol/L (ref 0–100)
TCO2: 31 mmol/L (ref 0–100)
pCO2 arterial: 32 mmHg (ref 32.0–48.0)
pH, Arterial: 7.098 — CL (ref 7.350–7.450)
pO2, Arterial: 77 mmHg — ABNORMAL LOW (ref 83.0–108.0)

## 2016-06-23 LAB — URINALYSIS, ROUTINE W REFLEX MICROSCOPIC
BACTERIA UA: NONE SEEN
Bilirubin Urine: NEGATIVE
Hgb urine dipstick: NEGATIVE
Ketones, ur: NEGATIVE mg/dL
Leukocytes, UA: NEGATIVE
Nitrite: NEGATIVE
PH: 5 (ref 5.0–8.0)
Protein, ur: NEGATIVE mg/dL
SPECIFIC GRAVITY, URINE: 1.016 (ref 1.005–1.030)

## 2016-06-23 LAB — RAPID URINE DRUG SCREEN, HOSP PERFORMED
Amphetamines: NOT DETECTED
BARBITURATES: NOT DETECTED
BENZODIAZEPINES: POSITIVE — AB
COCAINE: NOT DETECTED
Opiates: NOT DETECTED
TETRAHYDROCANNABINOL: NOT DETECTED

## 2016-06-23 LAB — PHOSPHORUS: Phosphorus: 11.5 mg/dL — ABNORMAL HIGH (ref 2.5–4.6)

## 2016-06-23 LAB — DIC (DISSEMINATED INTRAVASCULAR COAGULATION)PANEL
D-Dimer, Quant: 2.56 ug/mL-FEU — ABNORMAL HIGH (ref 0.00–0.50)
Prothrombin Time: 19.1 seconds — ABNORMAL HIGH (ref 11.4–15.2)

## 2016-06-23 LAB — PROTIME-INR
INR: 1.64
Prothrombin Time: 19.6 seconds — ABNORMAL HIGH (ref 11.4–15.2)

## 2016-06-23 LAB — CG4 I-STAT (LACTIC ACID): Lactic Acid, Venous: >17 mmol/L (ref 0.5–1.9)

## 2016-06-23 LAB — PREPARE RBC (CROSSMATCH)

## 2016-06-23 LAB — BETA-HYDROXYBUTYRIC ACID: BETA-HYDROXYBUTYRIC ACID: 1.8 mmol/L — AB (ref 0.05–0.27)

## 2016-06-23 LAB — TRIGLYCERIDES: TRIGLYCERIDES: 107 mg/dL (ref <150)

## 2016-06-23 LAB — MAGNESIUM: MAGNESIUM: 2.6 mg/dL — AB (ref 1.7–2.4)

## 2016-06-23 LAB — APTT: APTT: 28 s (ref 24–36)

## 2016-06-23 LAB — MRSA PCR SCREENING: MRSA by PCR: NEGATIVE

## 2016-06-23 LAB — ABO/RH: ABO/RH(D): O POS

## 2016-06-23 LAB — FIBRINOGEN: Fibrinogen: 218 mg/dL (ref 210–475)

## 2016-06-23 MED ORDER — SODIUM CHLORIDE 0.9 % IV SOLN: 250.0000 mL | INTRAVENOUS | Status: DC | PRN

## 2016-06-23 MED ORDER — LACTATED RINGERS IV BOLUS (SEPSIS)
500.0000 mL | Freq: Once | INTRAVENOUS | Status: AC
  Administered 2016-06-23: 500 mL via INTRAVENOUS

## 2016-06-23 MED ORDER — MIDAZOLAM HCL 2 MG/2ML IJ SOLN
INTRAMUSCULAR | Status: AC
Start: 2016-06-23 — End: 2016-06-23
  Filled 2016-06-23: qty 2

## 2016-06-23 MED ORDER — VASOPRESSIN 20 UNIT/ML IV SOLN
0.0300 [IU]/min | INTRAVENOUS | Status: DC
  Administered 2016-06-23: 0.03 [IU]/min via INTRAVENOUS
  Filled 2016-06-23 (×2): qty 2

## 2016-06-23 MED ORDER — SODIUM BICARBONATE 8.4 % IV SOLN
INTRAVENOUS | Status: DC | PRN
  Administered 2016-06-23: 100 meq via INTRAVENOUS

## 2016-06-23 MED ORDER — SODIUM CHLORIDE 0.9 % IV SOLN
INTRAVENOUS | Status: DC | PRN
  Administered 2016-06-23: 07:00:00 via INTRA_ARTERIAL

## 2016-06-23 MED ORDER — VANCOMYCIN HCL 500 MG IV SOLR: 500.0000 mg | INTRAVENOUS | Status: DC

## 2016-06-23 MED ORDER — SODIUM CHLORIDE 0.9 % IV SOLN
30.0000 meq | Freq: Once | INTRAVENOUS | Status: AC
  Administered 2016-06-23: 30 meq via INTRAVENOUS
  Filled 2016-06-23: qty 15

## 2016-06-23 MED ORDER — ORAL CARE MOUTH RINSE
15.0000 mL | OROMUCOSAL | Status: DC
  Administered 2016-06-23 – 2016-06-24 (×12): 15 mL via OROMUCOSAL

## 2016-06-23 MED ORDER — SODIUM CHLORIDE 0.9 % IV SOLN
50.0000 ug/h | INTRAVENOUS | Status: DC
  Administered 2016-06-23: 50 ug/h via INTRAVENOUS
  Filled 2016-06-23 (×3): qty 1

## 2016-06-23 MED ORDER — PIPERACILLIN-TAZOBACTAM 3.375 G IVPB 30 MIN
3.3750 g | Freq: Once | INTRAVENOUS | Status: DC
  Filled 2016-06-23: qty 50

## 2016-06-23 MED ORDER — PROPOFOL 1000 MG/100ML IV EMUL
5.0000 ug/kg/min | INTRAVENOUS | Status: DC
  Administered 2016-06-23: 35 ug/kg/min via INTRAVENOUS
  Administered 2016-06-23: 45 ug/kg/min via INTRAVENOUS
  Filled 2016-06-23 (×3): qty 100

## 2016-06-23 MED ORDER — THIAMINE HCL 100 MG/ML IJ SOLN
100.0000 mg | Freq: Every day | INTRAMUSCULAR | Status: DC
  Administered 2016-06-23 – 2016-06-24 (×2): 100 mg via INTRAVENOUS
  Filled 2016-06-23 (×2): qty 2

## 2016-06-23 MED ORDER — FENTANYL 2500MCG IN NS 250ML (10MCG/ML) PREMIX INFUSION
10.0000 ug/h | INTRAVENOUS | Status: DC
  Filled 2016-06-23: qty 250

## 2016-06-23 MED ORDER — PANTOPRAZOLE SODIUM 40 MG IV SOLR: 40.0000 mg | Freq: Two times a day (BID) | INTRAVENOUS | Status: DC

## 2016-06-23 MED ORDER — PANTOPRAZOLE SODIUM 40 MG IV SOLR
40.0000 mg | Freq: Once | INTRAVENOUS | Status: AC
  Administered 2016-06-23: 40 mg via INTRAVENOUS
  Filled 2016-06-23: qty 40

## 2016-06-23 MED ORDER — INSULIN ASPART 100 UNIT/ML ~~LOC~~ SOLN
2.0000 [IU] | SUBCUTANEOUS | Status: DC
  Administered 2016-06-24: 4 [IU] via SUBCUTANEOUS
  Administered 2016-06-24: 6 [IU] via SUBCUTANEOUS

## 2016-06-23 MED ORDER — FOLIC ACID 5 MG/ML IJ SOLN
1.0000 mg | Freq: Every day | INTRAMUSCULAR | Status: DC
  Administered 2016-06-23 – 2016-06-24 (×2): 1 mg via INTRAVENOUS
  Filled 2016-06-23 (×2): qty 0.2

## 2016-06-23 MED ORDER — CHLORHEXIDINE GLUCONATE 0.12% ORAL RINSE (MEDLINE KIT)
15.0000 mL | Freq: Two times a day (BID) | OROMUCOSAL | Status: DC
  Administered 2016-06-23 – 2016-06-24 (×3): 15 mL via OROMUCOSAL

## 2016-06-23 MED ORDER — SODIUM CHLORIDE 0.9 % IV SOLN
INTRAVENOUS | Status: DC
  Administered 2016-06-23: 16.2 [IU]/h via INTRAVENOUS
  Filled 2016-06-23 (×2): qty 2.5

## 2016-06-23 MED ORDER — PROPOFOL 1000 MG/100ML IV EMUL
INTRAVENOUS | Status: AC
Start: 2016-06-23 — End: 2016-06-23
  Administered 2016-06-23: 04:00:00
  Filled 2016-06-23: qty 100

## 2016-06-23 MED ORDER — PHENYLEPHRINE 40 MCG/ML (10ML) SYRINGE FOR IV PUSH (FOR BLOOD PRESSURE SUPPORT)
PREFILLED_SYRINGE | INTRAVENOUS | Status: AC
  Filled 2016-06-23: qty 10

## 2016-06-23 MED ORDER — SODIUM CHLORIDE 0.9 % IV SOLN
0.5000 mg/h | INTRAVENOUS | Status: DC
  Filled 2016-06-23: qty 10

## 2016-06-23 MED ORDER — SODIUM CHLORIDE 0.9 % IV SOLN: 10.0000 mL/h | Freq: Once | INTRAVENOUS | Status: DC

## 2016-06-23 MED ORDER — PROPOFOL 1000 MG/100ML IV EMUL
5.0000 ug/kg/min | INTRAVENOUS | Status: DC
  Administered 2016-06-23: 35 ug/kg/min via INTRAVENOUS
  Administered 2016-06-23: 20 ug/kg/min via INTRAVENOUS

## 2016-06-23 MED ORDER — MIDAZOLAM HCL 2 MG/2ML IJ SOLN
2.0000 mg | INTRAMUSCULAR | Status: DC | PRN
  Administered 2016-06-23: 2 mg via INTRAVENOUS

## 2016-06-23 MED ORDER — INSULIN GLARGINE 100 UNIT/ML ~~LOC~~ SOLN
40.0000 [IU] | SUBCUTANEOUS | Status: DC
  Administered 2016-06-23: 40 [IU] via SUBCUTANEOUS
  Filled 2016-06-23 (×2): qty 0.4

## 2016-06-23 MED ORDER — SODIUM BICARBONATE 8.4 % IV SOLN
INTRAVENOUS | Status: DC
  Administered 2016-06-23 (×4): via INTRAVENOUS
  Filled 2016-06-23 (×6): qty 150

## 2016-06-23 MED ORDER — MIDAZOLAM HCL 2 MG/2ML IJ SOLN
INTRAMUSCULAR | Status: AC
  Filled 2016-06-23: qty 2

## 2016-06-23 MED ORDER — SODIUM CHLORIDE 0.9 % IV SOLN
80.0000 mg | Freq: Once | INTRAVENOUS | Status: DC
  Filled 2016-06-23: qty 80

## 2016-06-23 MED ORDER — SODIUM CHLORIDE 0.9 % IV SOLN
8.0000 mg/h | INTRAVENOUS | Status: DC
  Administered 2016-06-23 (×2): 8 mg/h via INTRAVENOUS
  Filled 2016-06-23 (×5): qty 80

## 2016-06-23 MED ORDER — DEXTROSE 5 % IV SOLN
0.0000 ug/min | INTRAVENOUS | Status: DC
  Administered 2016-06-23 (×2): 25 ug/min via INTRAVENOUS
  Administered 2016-06-23: 41 ug/min via INTRAVENOUS
  Filled 2016-06-23 (×4): qty 16

## 2016-06-23 MED ORDER — FENTANYL CITRATE (PF) 100 MCG/2ML IJ SOLN
100.0000 ug | INTRAMUSCULAR | Status: DC | PRN
  Filled 2016-06-23: qty 2

## 2016-06-23 MED ORDER — EPINEPHRINE PF 1 MG/10ML IJ SOSY
PREFILLED_SYRINGE | INTRAMUSCULAR | Status: DC | PRN
  Administered 2016-06-23 (×4): 1 mg via INTRAVENOUS

## 2016-06-23 MED ORDER — VANCOMYCIN HCL IN DEXTROSE 1-5 GM/200ML-% IV SOLN
1000.0000 mg | Freq: Once | INTRAVENOUS | Status: AC
  Administered 2016-06-23: 1000 mg via INTRAVENOUS
  Filled 2016-06-23: qty 200

## 2016-06-23 MED ORDER — ETOMIDATE 2 MG/ML IV SOLN: 20.0000 mg | Freq: Once | INTRAVENOUS | Status: DC

## 2016-06-23 MED ORDER — KETAMINE HCL-SODIUM CHLORIDE 100-0.9 MG/10ML-% IV SOSY: 100.0000 mg | PREFILLED_SYRINGE | Freq: Once | INTRAVENOUS | Status: DC

## 2016-06-23 MED ORDER — FENTANYL CITRATE (PF) 100 MCG/2ML IJ SOLN
INTRAMUSCULAR | Status: AC
  Filled 2016-06-23: qty 2

## 2016-06-23 MED ORDER — OCTREOTIDE ACETATE 500 MCG/ML IJ SOLN
50.0000 ug/h | INTRAVENOUS | Status: DC
  Administered 2016-06-23 (×2): 50 ug/h via INTRAVENOUS
  Filled 2016-06-23 (×3): qty 1

## 2016-06-23 MED ORDER — PIPERACILLIN-TAZOBACTAM 3.375 G IVPB
3.3750 g | Freq: Three times a day (TID) | INTRAVENOUS | Status: DC
  Administered 2016-06-23 – 2016-06-24 (×4): 3.375 g via INTRAVENOUS
  Filled 2016-06-23 (×5): qty 50

## 2016-06-23 MED ORDER — PROPOFOL 1000 MG/100ML IV EMUL
INTRAVENOUS | Status: AC
  Filled 2016-06-23: qty 100

## 2016-06-23 MED ORDER — KETAMINE HCL-SODIUM CHLORIDE 100-0.9 MG/10ML-% IV SOSY
PREFILLED_SYRINGE | INTRAVENOUS | Status: AC
  Filled 2016-06-23: qty 10

## 2016-06-23 MED ORDER — NOREPINEPHRINE BITARTRATE 1 MG/ML IV SOLN
0.0000 ug/min | Freq: Once | INTRAVENOUS | Status: AC
  Administered 2016-06-23: 5 ug/min via INTRAVENOUS
  Filled 2016-06-23: qty 4

## 2016-06-23 MED ORDER — MAGNESIUM SULFATE 50 % IJ SOLN
INTRAMUSCULAR | Status: AC | PRN
  Administered 2016-06-23: 2 g via INTRAVENOUS

## 2016-06-23 MED ORDER — PANTOPRAZOLE SODIUM 40 MG IV SOLR
40.0000 mg | Freq: Two times a day (BID) | INTRAVENOUS | Status: DC
  Administered 2016-06-24: 40 mg via INTRAVENOUS
  Filled 2016-06-23: qty 40

## 2016-06-23 MED ORDER — SODIUM BICARBONATE 8.4 % IV SOLN
INTRAVENOUS | Status: AC
  Filled 2016-06-23: qty 100

## 2016-06-23 MED ORDER — NOREPINEPHRINE BITARTRATE 1 MG/ML IV SOLN
0.0000 ug/min | INTRAVENOUS | Status: DC
  Filled 2016-06-23: qty 4

## 2016-06-23 MED ORDER — VANCOMYCIN HCL IN DEXTROSE 1-5 GM/200ML-% IV SOLN
1000.0000 mg | INTRAVENOUS | Status: DC
  Filled 2016-06-23: qty 200

## 2016-06-23 MED ORDER — SODIUM CHLORIDE 0.9 % IV SOLN: 50.0000 ug/h | INTRAVENOUS | Status: DC

## 2016-06-23 MED ORDER — OCTREOTIDE LOAD VIA INFUSION
50.0000 ug | Freq: Once | INTRAVENOUS | Status: DC
  Filled 2016-06-23: qty 25

## 2016-06-23 MED ORDER — PROPOFOL 1000 MG/100ML IV EMUL
5.0000 ug/kg/min | INTRAVENOUS | Status: DC
  Administered 2016-06-23: 45 ug/kg/min via INTRAVENOUS

## 2016-06-23 MED ORDER — FENTANYL CITRATE (PF) 100 MCG/2ML IJ SOLN
100.0000 ug | INTRAMUSCULAR | Status: AC | PRN
  Administered 2016-06-23 (×3): 100 ug via INTRAVENOUS
  Filled 2016-06-23 (×2): qty 2

## 2016-06-23 MED FILL — Medication: Qty: 1 | Status: AC

## 2016-06-23 NOTE — Progress Notes (Addendum)
Patient chart currently merging.   H/O CKD II, ETOH Pancreatitis, Uncontrolled DM, GERD, gastroparesis, HTN, hyperlipidemia, PVD, and erosive esophagitis   Per Cousin, whom patient lives with full time, for the last 48 hours patient has been complaining of ABD pain. When cousin got home tonight he found a trash can half full of bright red blood and the patient very lethargic.

## 2016-06-23 NOTE — H&P (Signed)
PULMONARY / CRITICAL CARE MEDICINE   Name: [name] MRN: [id number] DOB: 04/10/1875    ADMISSION DATE:  06/26/2016 CONSULTATION DATE:  06/23/2016  REFERRING MD:  Dr. Kathrynn Humble   CHIEF COMPLAINT:  AMS  HISTORY OF PRESENT ILLNESS:   Unknown male presents to ED from home after cousin found patient unresponsive in bathroom and called 911. No family present on arrival and patient had no ID. Glucose >700, Hemoglobin 6.6, patient unresponsive, intubated in ED for airway protection. Estimated 1.5L of bloody output from OG tube. While in the ED patient coded 3 times for a total of about 15 minutes total. Went into [place] and was shock a total of 3 times. Went into Torsades, given 2 mg Magnesium. PCCM asked to admit.   PAST MEDICAL HISTORY :  He  has no past medical history on file.  PAST SURGICAL HISTORY: He  has no past surgical history on file.  Not on File  No current facility-administered medications on file prior to encounter.    No current outpatient prescriptions on file prior to encounter.    FAMILY HISTORY:  His has no family status information on file.    SOCIAL HISTORY: He    REVIEW OF SYSTEMS:   Unable to review patient is intubated.   SUBJECTIVE:  Intubated and unresponsive   VITAL SIGNS: BP (!) 92/32   Pulse 85   Temp (!) 91.1 F (32.8 C) (Rectal)   Resp (!) 25   SpO2 94%   HEMODYNAMICS:    VENTILATOR SETTINGS:    INTAKE / OUTPUT: No intake/output data recorded.  PHYSICAL EXAMINATION: General:  Adult male, no distress  Neuro:  Withdrawals to pain, pupils intact, does not follow commands  HEENT:  ETT in place, bloody output noted to mouth and nose  Cardiovascular:  Irregular, Tachy, NO MRG Lungs:  Coarse breath sounds, unlabored, on vent  Abdomen:  Distended, active bowel sounds  Musculoskeletal:  Bilateral BKA  Skin:  Cool, dry, intact   LABS:  BMET No results for input(s): NA, K, CL, CO2, BUN, CREATININE, GLUCOSE in the last 168  hours.  Electrolytes No results for input(s): CALCIUM, MG, PHOS in the last 168 hours.  CBC  Recent Labs Lab 06/27/2016 2327  WBC 29.3*  HGB 6.6*  HCT 21.8*  PLT 459*    Coag's No results for input(s): APTT, INR in the last 168 hours.  Sepsis Markers No results for input(s): LATICACIDVEN, PROCALCITON, O2SATVEN in the last 168 hours.  ABG No results for input(s): PHART, PCO2ART, PO2ART in the last 168 hours.  Liver Enzymes No results for input(s): AST, ALT, ALKPHOS, BILITOT, ALBUMIN in the last 168 hours.  Cardiac Enzymes  Recent Labs Lab 07/01/2016 2327  TROPONINI 0.07*    Glucose No results for input(s): GLUCAP in the last 168 hours.  Imaging Dg Chest Portable 1 View  Result Date: 06/09/2016 CLINICAL DATA:  Altered mental status.  Hypertension. EXAM: PORTABLE CHEST 1 VIEW COMPARISON:  None. FINDINGS: Shallow inspiration. Normal heart size and pulmonary vascularity. No focal airspace disease or consolidation in the lungs. No blunting of costophrenic angles. No pneumothorax. Mediastinal contours appear intact. IMPRESSION: No active disease. Electronically Signed   By: Lucienne Capers M.D.   On: 07/06/2016 23:58     STUDIES:  CT Head 3/16 > CT Chest 3/16 > CT A/P 3/16 >  CULTURES: Urine 3/16 > Blood 3/16 > Sputum 3/16 >  ANTIBIOTICS: Zosyn 3/16 > Vancomycin 3/16 >  SIGNIFICANT EVENTS: 3/15 > Presents  to ED unresponsive   LINES/TUBES: ETT 3/16 >> Right Subclavian CVC 3/16 >> Left Femoral Aline 3/16 >>   DISCUSSION: Unknown male presents to ED via EMS after cousin called 911 and stated he found the patient unresponsive in the bathroom. No family present on patient arrival. Patient had no Identification. Glucose >700, Hemoglobin 6.6, patient unresponsive, intubated in ED for airway protection. Estimated 1.5L of bloody output from OG tube.   ASSESSMENT / PLAN:  PULMONARY A: Vent Dependent secondary to Hemorrhagic Shock  P:   Vent Support  Wean as  tolerated  ABG in one hour   CXR now   CARDIOVASCULAR A:  Cardiac Arrest secondary to Hemorrhagic Shock  -S/P 3 RBC and 7L NS  P:  Cardiac Monitoring  Trend Troponin   Wean Levophed to Maintain MAP >65  RENAL A:   Hyponatremia  AKI (?CKD) Lactic Acidosis  Metabolic Acidosis  P:   Trend BMP Replace Electrolytes as needed  Bicarb GTT at 125 ml/hr   GASTROINTESTINAL A:   Hemoccult Positive  P:   NPO PPI  GI Consulted (GI will come to see in morning, will need to call office and update with patient name if changed over from unknown) IR consulted  Start Octreotide gtt  Start Protonix gtt  CT C/A/P pending   HEMATOLOGIC A:   Anemia  S/P 3 Units RBC  P:  Transfuse for Hbg <7 Trend CBC q6h Hold anticoagulation   INFECTIOUS A:   Hemorrhagic Shock +/-Sepsis  P:   Trend Fever and WBC curve Follow culture data Continue Vancomycin and Zosyn   ENDOCRINE A:   Hyperglycemia HHNS vs DKA -BetaH 1.8, Ketones Neg  P:   Insulin GTT CBG q1h   NEUROLOGIC A:   Acute Encephalopathy  P:   CT Head Pending  Fentanyl and Versed PRN  RASS goal: 0    FAMILY  - Updates: No family present   - Inter-disciplinary family meet or Palliative Care meeting due by: 3/23   CC Time: 125 minutes   [name], AG-ACNP [facility]  Pgr: [phone]  PCCM Pgr: [phone]

## 2016-06-23 NOTE — Consult Note (Signed)
Subjective:   HPI  The patient is a 51 year old male with multiple medical problems including hypertension insulin-dependent diabetes and bilateral BKA amputee area did he was admitted to the hospital after being brought in as a [name] as they did not know his name or who he was. He was found in the bathroom and there was blood in the garbage can which he apparently vomited up from what I was told. He also had some dark-colored stool. When he arrived at the emergency room he was in shock and he was intubated. He was found to be markedly anemic and was transfused blood. His initial hemoglobin was 6.6 and posttransfusion was 8.1. His blood glucose was greater than 1000. He had a CT scan of the abdomen which showed wall thickening in the sigmoid of uncertain etiology. This could be stricture, ischemia, or neoplasm. There was also portal venous gas. He had a CT of the head showing an abnormality in the frontoparietal area which could be artifact, infarct, or cerebritis. He has a history of drinking excessive alcohol from what I was told. He has a history of erosive distal esophagitis from an EGD in 2016.  Review of Systems Unobtainable. Patient is unconscious and intubated  No past medical history on file. No past surgical history on file. Social History   Social History  . Marital status: Married    Spouse name: N/A  . Number of children: N/A  . Years of education: N/A   Occupational History  . Not on file.   Social History Main Topics  . Smoking status: Not on file  . Smokeless tobacco: Not on file  . Alcohol use Not on file  . Drug use: Unknown  . Sexual activity: Not on file   Other Topics Concern  . Not on file   Social History Narrative  . No narrative on file   family history is not on file.  Current Facility-Administered Medications:  .  0.9 %  sodium chloride infusion, 10 mL/hr, Intravenous, Once, Ankit Nanavati, MD .  0.9 %  sodium chloride infusion, 250 mL, Intravenous,  PRN, Omar Person, NP .  Place/Maintain arterial line, , , Until Discontinued **AND** 0.9 %  sodium chloride infusion, , Intra-arterial, PRN, Omar Person, NP .  chlorhexidine gluconate (MEDLINE KIT) (PERIDEX) 0.12 % solution 15 mL, 15 mL, Mouth Rinse, BID, Kara Mead V, MD, 15 mL at 06/23/16 0800 .  etomidate (AMIDATE) injection 20 mg, 20 mg, Intravenous, Once, Ankit Nanavati, MD .  fentaNYL (SUBLIMAZE) 100 MCG/2ML injection, , , ,  .  fentaNYL (SUBLIMAZE) injection 100 mcg, 100 mcg, Intravenous, Q2H PRN, Omar Person, NP .  folic acid injection 1 mg, 1 mg, Intravenous, Daily, Omar Person, NP, 1 mg at 06/23/16 1017 .  insulin regular (NOVOLIN R,HUMULIN R) 250 Units in sodium chloride 0.9 % 250 mL (1 Units/mL) infusion, , Intravenous, Continuous, Omar Person, NP, Last Rate: 11.1 mL/hr at 06/23/16 1035, 11.1 Units/hr at 06/23/16 1035 .  ketamine 100 mg in normal saline 10 mL (24m/mL) syringe, 100 mg, Intravenous, Once, Ankit Nanavati, MD .  Ketamine HCl-Sodium Chloride 100-0.9 MG/10ML-% SOSY, , , ,  .  MEDLINE mouth rinse, 15 mL, Mouth Rinse, 10 times per day, RRigoberto Noel MD, 15 mL at 06/23/16 1000 .  midazolam (VERSED) 2 MG/2ML injection, , , ,  .  midazolam (VERSED) 2 MG/2ML injection, , , ,  .  midazolam (VERSED) injection 2 mg, 2 mg, Intravenous, Q15  min PRN, Omar Person, NP, 2 mg at 06/23/16 0323 .  midazolam (VERSED) injection 2 mg, 2 mg, Intravenous, Q2H PRN, Omar Person, NP, 2 mg at 06/23/16 0336 .  norepinephrine (LEVOPHED) 16 mg in dextrose 5 % 250 mL (0.064 mg/mL) infusion, 0-50 mcg/min, Intravenous, Continuous, Kara Mead V, MD, Last Rate: 28.1 mL/hr at 06/23/16 1000, 30 mcg/min at 06/23/16 1000 .  octreotide (SANDOSTATIN) 2 mcg/mL load via infusion 50 mcg, 50 mcg, Intravenous, Once **AND** [DISCONTINUED] octreotide (SANDOSTATIN) 500 mcg in sodium chloride 0.9 % 250 mL (2 mcg/mL) infusion, 50 mcg/hr, Intravenous, Continuous, Omar Person, NP, Last Rate: 25 mL/hr at 06/23/16 0617, 50 mcg/hr at 06/23/16 0617 .  octreotide (SANDOSTATIN) 500 mcg in dextrose 5 % 250 mL (2 mcg/mL) infusion, 50 mcg/hr, Intravenous, Continuous, Kara Mead V, MD .  pantoprazole (PROTONIX) 80 mg in sodium chloride 0.9 % 100 mL IVPB, 80 mg, Intravenous, Once, Omar Person, NP .  pantoprazole (PROTONIX) 80 mg in sodium chloride 0.9 % 250 mL (0.32 mg/mL) infusion, 8 mg/hr, Intravenous, Continuous, Omar Person, NP, Last Rate: 25 mL/hr at 06/23/16 0600, 8 mg/hr at 06/23/16 0600 .  [START ON 06/26/2016] pantoprazole (PROTONIX) injection 40 mg, 40 mg, Intravenous, Q12H, Omar Person, NP .  piperacillin-tazobactam (ZOSYN) IVPB 3.375 g, 3.375 g, Intravenous, Once, Ankit Nanavati, MD .  piperacillin-tazobactam (ZOSYN) IVPB 3.375 g, 3.375 g, Intravenous, Q8H, Erenest Blank, RPH .  potassium chloride 30 mEq in sodium chloride 0.9 % 265 mL (KCL MULTIRUN) IVPB, 30 mEq, Intravenous, Once, Donita Brooks, NP .  propofol (DIPRIVAN) 1000 MG/100ML infusion, 5-80 mcg/kg/min, Intravenous, Titrated, Omar Person, NP .  sodium bicarbonate 1 mEq/mL injection, , , ,  .  sodium bicarbonate 150 mEq in dextrose 5 % 1,000 mL infusion, , Intravenous, Continuous, Omar Person, NP, Last Rate: 125 mL/hr at 06/23/16 0600 .  thiamine (B-1) injection 100 mg, 100 mg, Intravenous, Daily, Omar Person, NP, 100 mg at 06/23/16 1017 .  [START ON 07/03/2016] vancomycin (VANCOCIN) 500 mg in sodium chloride 0.9 % 100 mL IVPB, 500 mg, Intravenous, Q24H, Erenest Blank, RPH .  vasopressin (PITRESSIN) 40 Units in sodium chloride 0.9 % 250 mL (0.16 Units/mL) infusion, 0.03 Units/min, Intravenous, Continuous, Omar Person, NP, Stopped at 06/23/16 [phone] No Known Allergies   Objective:     BP (!) 120/58   Pulse (!) 129   Temp (!) 100.6 F (38.1 C)   Resp (!) 34   Wt 88 kg (194 lb)   SpO2 100%   Unconscious  Intubated  NG tube in place draining  dark-colored blood  Heart regular rhythm  Abdomen soft and nontender  Laboratory No components found for: D1    Assessment:     Shock  Diabetes out of control  Patient coded in the emergency room  Gastrointestinal bleeding most likely secondary to erosive gastritis or esophagitis  Anemia  Question of frontoparietal infarct or cerebritis  Wall thickening and sigmoid of uncertain etiology      Plan:     Continue medical care at this time as is currently being done by the ICU team. From a GI standpoint I would not recommend EGD at this time. I would only recommend EGD if he were having continued active bleeding with hemodynamic instability in which we felt it would be required to do therapeutic intervention. Would continue PPI therapy. The question of the abnormality in the sigmoid is unclear. Should he survive and  improve this could be addressed later. We will follow. Lab Results  Component Value Date   HGB 8.8 (L) 06/23/2016   HGB 8.1 (L) 06/23/2016   HGB 7.5 (L) 07/02/2016   HCT 25.4 (L) 06/23/2016   HCT 25.9 (L) 06/23/2016   HCT 22.0 (L) 06/19/2016   ALKPHOS 73 06/27/2016   AST 36 06/15/2016   ALT 22 06/15/2016

## 2016-06-23 NOTE — Consult Note (Signed)
Neurology Consultation Reason for Consult: Cerebral Edema Referring Physician: Levada Schilling  CC: unresponsive  History is obtained from:chart review  HPI: [name] is a 51 y.o. male with multiple medical problems who was found unresponsive with a GI bleed yesterday. On arrival, he had elevated glucose and severely low hemoglobin at 6.6. He was in shock on arrival, and after arrival, had multiple episodes of cardiac arrest in the ED, with total down time of about 15 minutes.   Since that time, he has been unresponsive. Anisocora was then noticed and CT was obtained showing diffuse cerebral edema.   He does continue to over breathe the vent occasionally.  ROS: A 14 point ROS was performed and is negative except as noted in the HPI.   PMHx: CKD II ETOH Pancreatitis Uncontrolled DM GERD Gastroparesis HTN Hyperlipidemia PVD erosive esophagitis   FHx: Unable to assess secondary to patient's altered mental status.    Social History: Unable to assess secondary to patient's altered mental status.    Exam: Current vital signs: BP (!) 66/50 (BP Location: Right Arm)   Pulse (!) 113   Temp 99.1 F (37.3 C)   Resp (!) 24   Wt 88 kg (194 lb)   SpO2 100%  Vital signs in last 24 hours: Temp:  [91.1 F (32.8 C)-100.9 F (38.3 C)] 99.1 F (37.3 C) (03/16 1700) Pulse Rate:  [41-189] 113 (03/16 1700) Resp:  [9-46] 24 (03/16 1700) BP: (66-166)/(23-99) 66/50 (03/16 1620) SpO2:  [90 %-100 %] 100 % (03/16 1700) Arterial Line BP: (66-159)/(44-72) 84/49 (03/16 1700) FiO2 (%):  [40 %-100 %] 40 % (03/16 1700) Weight:  [88 kg (194 lb)] 88 kg (194 lb) (03/16 0900)   Physical Exam  Constitutional: Appears well-developed and well-nourished.  Psych: Unresponsive Eyes: No scleral injection HENT: No OP obstrucion Head: Normocephalic.  Cardiovascular: Normal rate and regular rhythm.  Respiratory: Ventilated GI: Soft.  No distension.  Skin: WDI EXT: Bilateral BKAs  Neuro: Mental  Status: Patient is comatose, does not open eyes or follow commands Cranial Nerves: II: Pupils are dilated, 7 mm with possible sluggish reaction on the right, a millimeters and fixed on the left III,IV, VI: No doll's eye response V:VII: Corneals are absent X: No cough Motor: No response to noxious stimuli in any extremity Sensory: As above Cerebellar: Does not perform   I have reviewed labs in epic and the results pertinent to this consultation are: Creatinine 3.67   I have reviewed the images obtained: CT head-diffuse cerebral edema  Impression: 51 year old male with diffuse cerebral edema following cardiac arrest and prolonged hypotensive/anemic state following GI bleed. Appears to have had a devastating cerebral injury. Though DKA can contribute to cerebral edema, it is unlikely to be the sole cause in adults. At this point, it appears that he has herniated I do not think that aggressive measures are at all likely to be helpful.  I discussed this with the wife, and she agreed that he would not want aggressive measures if he does not have hope of meaningful survival. In this setting, I have made him DO NOT RESUSCITATE in the event of cardiac arrest.  He still has family coming in to see him, and therefore we are continuing current treatments at this time. Plan to reconvene tomorrow to discuss aggressiveness.  I suspect that he will be brain dead by that point.  Recommendations: 1) continue to discuss with family who I suspect will be amenable to withdrawal tomorrow.   Roland Rack,  MD [facility] [phone]  If 7pm- 7am, please page neurology on call as listed in [place].

## 2016-06-23 NOTE — ED Notes (Signed)
Charted in error.

## 2016-06-23 NOTE — Progress Notes (Signed)
CRITICAL VALUE ALERT  Critical value received:  Trop 64.29  Date of notification:  06/23/2016  Time of notification:  1300  Critical value read back:Yes.    Nurse who received alert:  Warner Mccreedy  MD notified (1st page):  B. Alfredo Martinez, NP  Time of first page:  75  MD notified (2nd page): Dr. Lamonte Sakai  Time of second page:  Responding MD:  [id number]  Time MD responded:  [phone]  No new orders received.

## 2016-06-23 NOTE — ED Notes (Signed)
Preparing for intubation

## 2016-06-23 NOTE — ED Notes (Signed)
Unit #2 PRBC started via [place]

## 2016-06-23 NOTE — Progress Notes (Signed)
Pt transported to 0L29 on 574% no complications noted.

## 2016-06-23 NOTE — Progress Notes (Signed)
[facility] Progress Note Patient Name: [name] DOB: 12/18/1965 MRN: [id number]   Date of Service  06/23/2016  HPI/Events of Note   Multiple issues: 1. Hypotension - BP = 83/47. CVP =  7 and 2. New Anisocoria.  eICU Interventions  Will order: 1. LR 500 mL IV over 30 minutes now.  2. ABG now. 3. Increase ceiling on Norepinephrine IV infusion to 70 mcg/min.   4. Head CT Scan without contrast now.      Intervention Category Major Interventions: Hypotension - evaluation and management;Change in mental status - evaluation and management  Jaquana Geiger Cornelia Copa 06/23/2016, 5:10 PM/

## 2016-06-23 NOTE — Progress Notes (Signed)
CRITICAL VALUE ALERT  Critical value received:  Lactic 9.0, Glucose 669  Date of notification:  06/23/2016  Time of notification:  0855  Critical value read back:Yes.    Nurse who received alert:  Ezequiel Kayser, RN  Rounding MD on floor and notified.

## 2016-06-23 NOTE — Progress Notes (Signed)
Patient ID: [name], male   DOB: 09/19/1965, 51 y.o.   MRN: [id number] Critically ill, I agree with Dr Laqueta Linden note from earlier, his abdomen is soft.  dont think he needs any surgery at this point

## 2016-06-23 NOTE — Progress Notes (Signed)
Initial Nutrition Assessment  INTERVENTION:   Once pt hemodynamically stable recommend initiating the adult enteral nutrition protocol.   NUTRITION DIAGNOSIS:   Inadequate oral intake related to inability to eat as evidenced by NPO status.  GOAL:   Patient will meet greater than or equal to 90% of their needs  MONITOR:   Vent status, I & O's  REASON FOR ASSESSMENT:   Ventilator    ASSESSMENT:   Pt with PMH of CKD II, ETOH Pancreatitis, Uncontrolled DM, bilateral amputeeGERD, gastroparesis, HTN, hyperlipidemia, PVD, and erosive esophagitis. Admitted for GIB after being found lethargic in the bathroom after large emesis of bright red blood. Pt lives with cousin.    Patient is currently intubated on ventilator support MV: 19.7 L/min Temp (24hrs), Avg:99 F (37.2 C), Min:91.1 F (32.8 C), Max:100.9 F (38.3 C)  Spoke with RN at bedside, BP had dropped 84/49, pressor increased and additional pressor added. MAP now back up.   Propofol: 18.9 ml/hr provides 498 kcal  Nutrition-Focused physical exam completed. Findings are no fat depletion, no muscle depletion, and no edema.  Spoke with wife. Pt has had multiple admission with coma/gi bleed after drinking. He was recently d/c'ed from rehab in [place].    Diet Order:  Diet NPO time specified  Skin:  Reviewed, no issues  Last BM:  3/16 - black  Height:   Ht Readings from Last 1 Encounters:  No data found for Ht    Weight:   Wt Readings from Last 1 Encounters:  06/23/16 194 lb (88 kg)    Ideal Body Weight:  70 kg  BMI:  There is no height or weight on file to calculate BMI.  Estimated Nutritional Needs:   Kcal:  161-0960  Protein:  >/= 140 grams  Fluid:  >1.5 L/day  EDUCATION NEEDS:   No education needs identified at this time  [place], [place], [place] Pager [phone] After Hours Pager

## 2016-06-23 NOTE — Progress Notes (Signed)
Noted that CBGs have been decreasing on the insulin drip down to 197 mg/dl. Spoke with staff RN in regards to dextrose in the fluids now that blood sugars are less than 250 mg/dl.  Staff RN states that the is a sodium bicarb drip going with dextrose at 125 cc/hr.  Staff RN also reported that patient was taking Lantus 16 units BID at home per family member in t he room. Will continue to monitor blood sugars while in the hospital. Harvel Ricks RN BSN CDE

## 2016-06-23 NOTE — Progress Notes (Signed)
CRITICAL VALUE ALERT  Critical value received:  Lactic 2.7  Date of notification:  06/23/2016  Time of notification:  9371  Critical value read back:Yes.    Nurse who received alert:  Roslyn Else, [employer]  Value consistent with previous. MD aware.

## 2016-06-23 NOTE — Progress Notes (Signed)
CRITICAL VALUE ALERT  Critical value received:  Lactic 3.5  Date of notification:  06/23/2016  Time of notification:  2527  Critical value read back:Yes.    Nurse who received alert:  Yossef Gilkison, [employer]  Lab value lower than previous that was reported to MD. MD aware.

## 2016-06-23 NOTE — ED Notes (Addendum)
Attempted to call spouse, no answer at this time.  Left message at [phone].

## 2016-06-23 NOTE — Progress Notes (Signed)
eLink Physician-Brief Progress Note Patient Name: [name] DOB: Jun 03, 1965 MRN: [id number]   Date of Service  06/23/2016  HPI/Events of Note  Poor prognosis given  eICU Interventions  De-escalate care Ph 7.44 - dc bicarb gtt CBGs better, AG resolved - give lantus 40 & dc insulin gtt in 2h  Dc protonix & octreotide gtt     Intervention Category Major Interventions: Hyperglycemia - active titration of insulin therapy;Acid-Base disturbance - evaluation and management  ALVA,RAKESH V. 06/23/2016, 10:29 PM

## 2016-06-23 NOTE — Code Documentation (Signed)
Unit #2 PRBC complete

## 2016-06-23 NOTE — ED Notes (Signed)
Unit #3 PRBC complete

## 2016-06-23 NOTE — Consult Note (Signed)
Reason for Consult: Questionable colon Abnormality on CT scan Referring Physician: Dr Rockwell Alexandria is an 51 y.o. male.  HPI: 51 year old hypertensive, insulin dependent diabetic, bilateral BKA amputee, was brought in as a Jenny Reichmann Doe last night after being found down in the bathroom. There was blood in the garbage can. On arrival to the emergency room he was in shock, intubated, and transfuse 3 units of blood. He coded 3x in ED requiring shock. There is evidence of upper GI bleeding at the time. He was started on IV fluids and vasopressors. His initial hemoglobin was 6.6. Posttransfusion it was 8.1. He had a blood glucose level greater than 1000, creatinine greater than 3, lactate around 19, white blood cell count 30. Pan scan noncontrasted was performed Which showed a questionable brain infarct. There was also an questionable area in mid sigmoid colon with wall thickening with small amount of portal venous gas adjacent to colon, non obstructed ventral incisional hernia.  He has had dark diarrhea since being here and old bld in [place].  On multiple vasopressors.   He does have h/o drinking and erosive esophagitis on prior egd 2016. Recent admission for DKA No past medical history on file.  No past surgical history on file.  No family history on file.  Social History:  has no tobacco, alcohol, and drug history on file.  Allergies: Not on File  Medications: I have reviewed the patient's current medications.  Results for orders placed or performed during the hospital encounter of 06/29/2016 (from the past 48 hour(s))  CBC with Differential (PNL)     Status: Abnormal   Collection Time: 07/05/2016 11:27 PM  Result Value Ref Range   WBC 29.3 (H) 4.0 - 10.5 K/uL    Comment: WHITE COUNT CONFIRMED ON SMEAR   RBC 2.22 (L) 4.22 - 5.81 MIL/uL   Hemoglobin 6.6 (LL) 13.0 - 17.0 g/dL    Comment: REPEATED TO VERIFY CRITICAL RESULT CALLED TO, READ BACK BY AND VERIFIED WITH: S.MIZE,RN 0005 06/23/16  M.CAMPBELL    HCT 21.8 (L) 39.0 - 52.0 %   MCV 98.2 78.0 - 100.0 fL   MCH 29.7 26.0 - 34.0 pg   MCHC 30.3 30.0 - 36.0 g/dL   RDW 13.6 11.5 - 15.5 %   Platelets 459 (H) 150 - 400 K/uL    Comment: PLATELET COUNT CONFIRMED BY SMEAR   Neutrophils Relative % 90 %   Lymphocytes Relative 5 %   Monocytes Relative 5 %   Eosinophils Relative 0 %   Basophils Relative 0 %   Neutro Abs 26.3 (H) 1.7 - 7.7 K/uL   Lymphs Abs 1.5 0.7 - 4.0 K/uL   Monocytes Absolute 1.5 (H) 0.1 - 1.0 K/uL   Eosinophils Absolute 0.0 0.0 - 0.7 K/uL   Basophils Absolute 0.0 0.0 - 0.1 K/uL   RBC Morphology BURR CELLS     Comment: SCHISTOCYTES NOTED ON SMEAR   WBC Morphology MILD LEFT SHIFT (1-5% METAS, OCC MYELO, OCC BANDS)   Beta-hydroxybutyric acid     Status: Abnormal   Collection Time: 06/15/2016 11:27 PM  Result Value Ref Range   Beta-Hydroxybutyric Acid 1.80 (H) 0.05 - 0.27 mmol/L  CK     Status: None   Collection Time: 06/13/2016 11:27 PM  Result Value Ref Range   Total CK 322 49 - 397 U/L  Troponin I     Status: Abnormal   Collection Time: 07/03/2016 11:27 PM  Result Value Ref Range   Troponin I 0.07 (  HH) <0.03 ng/mL    Comment: CRITICAL RESULT CALLED TO, READ BACK BY AND VERIFIED WITH: MIZE S,RN 06/23/16 0021 WAYK   Fibrinogen (coagulopathy lab panel)     Status: None   Collection Time: 07/05/2016 11:27 PM  Result Value Ref Range   Fibrinogen 218 210 - 475 mg/dL  Protime-INR (coagulopathy lab panel)     Status: Abnormal   Collection Time: 06/20/2016 11:27 PM  Result Value Ref Range   Prothrombin Time 19.6 (H) 11.4 - 15.2 seconds   INR 1.64   APTT (coagulopathy lab panel)     Status: None   Collection Time: 06/09/2016 11:27 PM  Result Value Ref Range   aPTT 28 24 - 36 seconds  DIC panel     Status: Abnormal   Collection Time: 07/02/2016 11:27 PM  Result Value Ref Range   Prothrombin Time 19.1 (H) 11.4 - 15.2 seconds   INR 1.59    aPTT 28 24 - 36 seconds   Fibrinogen 219 210 - 475 mg/dL   D-Dimer, Quant  2.56 (H) 0.00 - 0.50 ug/mL-FEU    Comment: (NOTE) At the manufacturer cut-off of 0.50 ug/mL FEU, this assay has been documented to exclude PE with a sensitivity and negative predictive value of 97 to 99%.  At this time, this assay has not been approved by the FDA to exclude DVT/VTE. Results should be correlated with clinical presentation.    Platelets 441 (H) 150 - 400 K/uL   Smear Review SCHISTOCYTES NOTED ON SMEAR   Comprehensive metabolic panel     Status: Abnormal   Collection Time: 06/16/2016 11:27 PM  Result Value Ref Range   Sodium 114 (LL) 135 - 145 mmol/L    Comment: REPEATED TO VERIFY CRITICAL RESULT CALLED TO, READ BACK BY AND VERIFIED WITH: MIZE S,RN 06/23/16 0251 WAYK    Potassium 5.1 3.5 - 5.1 mmol/L   Chloride 68 (L) 101 - 111 mmol/L    Comment: REPEATED TO VERIFY   CO2 8 (L) 22 - 32 mmol/L    Comment: REPEATED TO VERIFY   Glucose, Bld 1,361 (HH) 65 - 99 mg/dL    Comment: RESULTS CONFIRMED BY MANUAL DILUTION CRITICAL RESULT CALLED TO, READ BACK BY AND VERIFIED WITH: MIZE S,RN 06/23/16 0252 WAYK    BUN 113 (H) 6 - 20 mg/dL   Creatinine, Ser 3.67 (H) 0.61 - 1.24 mg/dL   Calcium 7.9 (L) 8.9 - 10.3 mg/dL   Total Protein 5.2 (L) 6.5 - 8.1 g/dL   Albumin 2.9 (L) 3.5 - 5.0 g/dL   AST 36 15 - 41 U/L   ALT 22 17 - 63 U/L   Alkaline Phosphatase 73 38 - 126 U/L   Total Bilirubin 0.5 0.3 - 1.2 mg/dL   GFR calc non Af Amer 9 (L) >60 mL/min   GFR calc Af Amer 11 (L) >60 mL/min    Comment: (NOTE) The eGFR has been calculated using the CKD EPI equation. This calculation has not been validated in all clinical situations. eGFR's persistently <60 mL/min signify possible Chronic Kidney Disease.    Anion gap 38 (H) 5 - 15  POC occult blood, ED Provider will collect     Status: Abnormal   Collection Time: 06/17/2016 11:58 PM  Result Value Ref Range   Fecal Occult Bld POSITIVE (A) NEGATIVE  Type and screen     Status: None (Preliminary result)   Collection Time: 06/23/16 12:11  AM  Result Value Ref Range   ABO/RH(D) O POS  Antibody Screen NEG    Sample Expiration 06/26/2016    Unit Number [id number]    Blood Component Type RED CELLS,LR    Unit division 00    Status of Unit ISSUED    Transfusion Status OK TO TRANSFUSE    Crossmatch Result Compatible    Unit Number [id number]    Blood Component Type RED CELLS,LR    Unit division 00    Status of Unit ISSUED    Transfusion Status OK TO TRANSFUSE    Crossmatch Result Compatible    Unit Number [id number]    Blood Component Type RED CELLS,LR    Unit division 00    Status of Unit REL FROM [facility]    Transfusion Status OK TO TRANSFUSE    Crossmatch Result Compatible    Unit Number [id number]    Blood Component Type RED CELLS,LR    Unit division 00    Status of Unit REL FROM [facility]    Transfusion Status OK TO TRANSFUSE    Crossmatch Result Compatible    Unit Number [id number]    Blood Component Type RED CELLS,LR    Unit division 00    Status of Unit ISSUED    Transfusion Status OK TO TRANSFUSE    Crossmatch Result Compatible    Unit Number [id number]    Blood Component Type RED CELLS,LR    Unit division 00    Status of Unit REL FROM [facility]    Transfusion Status OK TO TRANSFUSE    Crossmatch Result Compatible    Unit Number [id number]    Blood Component Type RED CELLS,LR    Unit division 00    Status of Unit REL FROM [facility]    Transfusion Status OK TO TRANSFUSE    Crossmatch Result Compatible    Unit Number [id number]    Blood Component Type RBC LR PHER2    Unit division 00    Status of Unit ALLOCATED    Transfusion Status OK TO TRANSFUSE    Crossmatch Result Compatible    Unit Number [id number]    Blood Component Type RED CELLS,LR    Unit division 00    Status of Unit ALLOCATED    Transfusion Status OK TO TRANSFUSE    Crossmatch Result Compatible    Unit Number [id number]    Blood Component Type RED CELLS,LR    Unit division 00    Status of Unit ALLOCATED     Transfusion Status OK TO TRANSFUSE    Crossmatch Result Compatible   ABO/Rh     Status: None (Preliminary result)   Collection Time: 06/23/16 12:11 AM  Result Value Ref Range   ABO/RH(D) O POS   Urinalysis, Routine w reflex microscopic     Status: Abnormal   Collection Time: 06/23/16 12:56 AM  Result Value Ref Range   Color, Urine STRAW (A) YELLOW   APPearance CLEAR CLEAR   Specific Gravity, Urine 1.016 1.005 - 1.030   pH 5.0 5.0 - 8.0   Glucose, UA >=500 (A) NEGATIVE mg/dL   Hgb urine dipstick NEGATIVE NEGATIVE   Bilirubin Urine NEGATIVE NEGATIVE   Ketones, ur NEGATIVE NEGATIVE mg/dL   Protein, ur NEGATIVE NEGATIVE mg/dL   Nitrite NEGATIVE NEGATIVE   Leukocytes, UA NEGATIVE NEGATIVE   RBC / HPF 0-5 0 - 5 RBC/hpf   WBC, UA 0-5 0 - 5 WBC/hpf   Bacteria, UA NONE SEEN NONE SEEN   Squamous Epithelial / LPF 0-5 (A) NONE SEEN  Prepare RBC  Status: None   Collection Time: 06/23/16  1:30 AM  Result Value Ref Range   Order Confirmation ORDER PROCESSED BY BLOOD BANK   CBG monitoring, ED     Status: Abnormal   Collection Time: 06/23/16  2:08 AM  Result Value Ref Range   Glucose-Capillary >600 (HH) 65 - 99 mg/dL  Prepare RBC     Status: None   Collection Time: 06/23/16  2:28 AM  Result Value Ref Range   Order Confirmation BB SAMPLE OR UNITS ALREADY AVAILABLE   CBG monitoring, ED     Status: Abnormal   Collection Time: 06/23/16  3:23 AM  Result Value Ref Range   Glucose-Capillary >600 (HH) 65 - 99 mg/dL   Comment 1 Notify RN    Comment 2 Document in Chart   Magnesium     Status: Abnormal   Collection Time: 06/23/16  3:24 AM  Result Value Ref Range   Magnesium 2.6 (H) 1.7 - 2.4 mg/dL  Phosphorus     Status: Abnormal   Collection Time: 06/23/16  3:24 AM  Result Value Ref Range   Phosphorus 11.5 (H) 2.5 - 4.6 mg/dL  Lactic acid, plasma     Status: Abnormal   Collection Time: 06/23/16  3:25 AM  Result Value Ref Range   Lactic Acid, Venous 19.5 (HH) 0.5 - 1.9 mmol/L    Comment:  RESULTS CONFIRMED BY MANUAL DILUTION CRITICAL RESULT CALLED TO, READ BACK BY AND VERIFIED WITH: TOMLINSON T,RN 06/23/16 0438 WAYK   CBC     Status: Abnormal   Collection Time: 06/23/16  3:25 AM  Result Value Ref Range   WBC 30.7 (H) 4.0 - 10.5 K/uL   RBC 2.72 (L) 4.22 - 5.81 MIL/uL   Hemoglobin 8.1 (L) 13.0 - 17.0 g/dL   HCT 25.9 (L) 39.0 - 52.0 %   MCV 95.2 78.0 - 100.0 fL   MCH 29.8 26.0 - 34.0 pg   MCHC 31.3 30.0 - 36.0 g/dL   RDW 14.0 11.5 - 15.5 %   Platelets 197 150 - 400 K/uL    Comment: DELTA CHECK NOTED SPECIMEN CHECKED FOR CLOTS REPEATED TO VERIFY PLATELET COUNT CONFIRMED BY SMEAR   Basic metabolic panel     Status: Abnormal   Collection Time: 06/23/16  3:25 AM  Result Value Ref Range   Sodium 125 (L) 135 - 145 mmol/L    Comment: DELTA CHECK NOTED   Potassium 3.6 3.5 - 5.1 mmol/L   Chloride 86 (L) 101 - 111 mmol/L   CO2 8 (L) 22 - 32 mmol/L   Glucose, Bld 1,033 (HH) 65 - 99 mg/dL    Comment: CRITICAL RESULT CALLED TO, READ BACK BY AND VERIFIED WITH: QUICK K,RN 06/23/16 0420 WAYK    BUN 103 (H) 6 - 20 mg/dL   Creatinine, Ser 3.34 (H) 0.61 - 1.24 mg/dL   Calcium 6.6 (L) 8.9 - 10.3 mg/dL   GFR calc non Af Amer 20 (L) >60 mL/min   GFR calc Af Amer 23 (L) >60 mL/min    Comment: (NOTE) The eGFR has been calculated using the CKD EPI equation. This calculation has not been validated in all clinical situations. eGFR's persistently <60 mL/min signify possible Chronic Kidney Disease.    Anion gap 31 (H) 5 - 15  I-Stat Arterial Blood Gas, ED  (MC, [facility]HP)     Status: Abnormal   Collection Time: 06/23/16  3:34 AM  Result Value Ref Range   pH, Arterial 6.887 (LL) 7.350 - 7.450  pCO2 arterial 38.1 32.0 - 48.0 mmHg   pO2, Arterial 425.0 (H) 83.0 - 108.0 mmHg   Bicarbonate 7.8 (L) 20.0 - 28.0 mmol/L   TCO2 9 0 - 100 mmol/L   O2 Saturation 100.0 %   Acid-base deficit 25.0 (H) 0.0 - 2.0 mmol/L   Patient temperature 91.1 F    Sample type ARTERIAL    Comment NOTIFIED  PHYSICIAN   Prepare fresh frozen plasma     Status: None (Preliminary result)   Collection Time: 06/23/16  4:26 AM  Result Value Ref Range   Unit Number [id number]    Blood Component Type THAWED PLASMA    Unit division 00    Status of Unit ISSUED    Transfusion Status OK TO TRANSFUSE   Prepare Pheresed Platelets     Status: None (Preliminary result)   Collection Time: 06/23/16  4:40 AM  Result Value Ref Range   Unit Number [id number]    Blood Component Type PLTP LR1 PAS    Unit division 00    Status of Unit ISSUED    Transfusion Status OK TO TRANSFUSE   Glucose, capillary     Status: Abnormal   Collection Time: 06/23/16  6:10 AM  Result Value Ref Range   Glucose-Capillary >600 (HH) 65 - 99 mg/dL  Blood gas, arterial     Status: Abnormal   Collection Time: 06/23/16  6:40 AM  Result Value Ref Range   FIO2 40.00    Delivery systems VENTILATOR    Mode PRESSURE REGULATED VOLUME CONTROL    VT 500 mL   LHR 24 resp/min   Peep/cpap 5.0 cm H20   pH, Arterial 7.321 (L) 7.350 - 7.450   pCO2 arterial 29.2 (L) 32.0 - 48.0 mmHg   pO2, Arterial 102 83.0 - 108.0 mmHg   Bicarbonate 14.9 (L) 20.0 - 28.0 mmol/L   Acid-base deficit 10.2 (H) 0.0 - 2.0 mmol/L   O2 Saturation 97.1 %   Patient temperature 96.1    Collection site A-LINE    Drawn by [id number]    Sample type ARTERIAL DRAW    Allens test (pass/fail) PASS PASS    Ct Abdomen Pelvis Wo Contrast  Result Date: 06/23/2016 CLINICAL DATA:  Patient was found unresponsive and respiratory arrest at home. Seizure-like activity. GI bleeding from the rectum, nose, and mouth. EXAM: CT CHEST, ABDOMEN AND PELVIS WITHOUT CONTRAST TECHNIQUE: Multidetector CT imaging of the chest, abdomen and pelvis was performed following the standard protocol without IV contrast. COMPARISON:  None. FINDINGS: CT CHEST FINDINGS Cardiovascular: Normal heart size. No pericardial effusion. Normal caliber thoracic aorta. Mediastinum/Nodes: Endotracheal and enteric tubes  are present. A right central venous catheter with tip in the low SVC. Esophagus is decompressed. No significant lymphadenopathy in the chest. Lungs/Pleura: Evaluation of lungs is limited due to respiratory motion artifact. Areas of consolidation or atelectasis demonstrated in the lung bases. No pleural effusions. No pneumothorax. Mild emphysematous changes in the lung apices. Musculoskeletal: No chest wall mass or suspicious bone lesions identified. CT ABDOMEN PELVIS FINDINGS Hepatobiliary: Increased density in the gallbladder may indicate sludge. No bile duct dilatation. No focal liver lesions. Pancreas: Unremarkable. No pancreatic ductal dilatation or surrounding inflammatory changes. Spleen: Normal in size without focal abnormality. Adrenals/Urinary Tract: Surgical absence of the left kidney. No hydronephrosis or hydroureter on the right. Calcification in the adrenal glands, likely representing evidence of old hemorrhage or infection. Mild diffuse thickening of the bladder wall could indicate cystitis or bladder outlet obstruction. Stomach/Bowel: Colon is mildly  distended and fluid filled with air-fluid levels. Transition zone is seen in the mid sigmoid region. There is wall thickening suggesting stricture or inflammatory bowel disease. Colonic neoplasm not entirely excluded. Small amount of portal venous gas is suggested in the pelvis adjacent to the colon. Focal ischemia or micro perforation could have this appearance. Fluid-filled small bowel without significant dilatation. Stomach is decompressed. Small ventral abdominal wall hernia containing a portion of the colon but without obstruction at this level. Vascular/Lymphatic: No significant vascular findings are present. No enlarged abdominal or pelvic lymph nodes. Reproductive: Prostate is unremarkable. Other: No free air or free fluid demonstrated in the abdomen. Musculoskeletal: No acute or significant osseous findings. IMPRESSION: Atelectasis or  infiltration demonstrated in both lung bases. Colonic dilatation with fluid-filled loops. Transition zone in the mid sigmoid region where there is an area of wall thickening and mild adjacent portal venous gas. Changes may represent stricture, inflammatory bowel disease, focal ischemia, or neoplasm. Fluid-filled small bowel without significant dilatation. Bladder wall thickening may indicate cystitis or outlet obstruction. Surgical absence of the left kidney. These results were called by telephone at the time of interpretation on 06/23/2016 at 5:07 am to Dr. [name] , who verbally acknowledged these results. Electronically Signed   By: Lucienne Capers M.D.   On: 06/23/2016 05:09   Ct Head Wo Contrast  Result Date: 06/23/2016 CLINICAL DATA:  Found unresponsive, seizure like activity. GI bleed. EXAM: CT HEAD WITHOUT CONTRAST TECHNIQUE: Contiguous axial images were obtained from the base of the skull through the vertex without intravenous contrast. COMPARISON:  None. FINDINGS: Multiple lines overlying the patient's head result in streak artifact. BRAIN: No intraparenchymal hemorrhage, mass effect nor midline shift. The ventricles and sulci are normal. No acute large vascular territory infarcts. Faint linear hypodensity RIGHT frontoparietal lobes. No abnormal extra-axial fluid collections. Basal cisterns are patent. VASCULAR: Unremarkable. SKULL/SOFT TISSUES: No skull fracture. No significant soft tissue swelling. Small RIGHT frontal scalp lipoma. ORBITS/SINUSES: Proptosis. Old RIGHT medial orbital blowout fracture. Mild paranasal sinus mucosal thickening. Small RIGHT mastoid effusion. OTHER: Life-support lines in place. IMPRESSION: Linear RIGHT frontoparietal hypodensity could represent artifact, acute infarct or, cerebritis. These results will be called to the ordering clinician or representative by the Radiologist Assistant, and communication documented in the [employer] Dashboard. Electronically Signed    By: Elon Alas M.D.   On: 06/23/2016 04:56   Ct Chest Wo Contrast  Result Date: 06/23/2016 CLINICAL DATA:  Patient was found unresponsive and respiratory arrest at home. Seizure-like activity. GI bleeding from the rectum, nose, and mouth. EXAM: CT CHEST, ABDOMEN AND PELVIS WITHOUT CONTRAST TECHNIQUE: Multidetector CT imaging of the chest, abdomen and pelvis was performed following the standard protocol without IV contrast. COMPARISON:  None. FINDINGS: CT CHEST FINDINGS Cardiovascular: Normal heart size. No pericardial effusion. Normal caliber thoracic aorta. Mediastinum/Nodes: Endotracheal and enteric tubes are present. A right central venous catheter with tip in the low SVC. Esophagus is decompressed. No significant lymphadenopathy in the chest. Lungs/Pleura: Evaluation of lungs is limited due to respiratory motion artifact. Areas of consolidation or atelectasis demonstrated in the lung bases. No pleural effusions. No pneumothorax. Mild emphysematous changes in the lung apices. Musculoskeletal: No chest wall mass or suspicious bone lesions identified. CT ABDOMEN PELVIS FINDINGS Hepatobiliary: Increased density in the gallbladder may indicate sludge. No bile duct dilatation. No focal liver lesions. Pancreas: Unremarkable. No pancreatic ductal dilatation or surrounding inflammatory changes. Spleen: Normal in size without focal abnormality. Adrenals/Urinary Tract: Surgical absence of the left kidney. No  hydronephrosis or hydroureter on the right. Calcification in the adrenal glands, likely representing evidence of old hemorrhage or infection. Mild diffuse thickening of the bladder wall could indicate cystitis or bladder outlet obstruction. Stomach/Bowel: Colon is mildly distended and fluid filled with air-fluid levels. Transition zone is seen in the mid sigmoid region. There is wall thickening suggesting stricture or inflammatory bowel disease. Colonic neoplasm not entirely excluded. Small amount of portal  venous gas is suggested in the pelvis adjacent to the colon. Focal ischemia or micro perforation could have this appearance. Fluid-filled small bowel without significant dilatation. Stomach is decompressed. Small ventral abdominal wall hernia containing a portion of the colon but without obstruction at this level. Vascular/Lymphatic: No significant vascular findings are present. No enlarged abdominal or pelvic lymph nodes. Reproductive: Prostate is unremarkable. Other: No free air or free fluid demonstrated in the abdomen. Musculoskeletal: No acute or significant osseous findings. IMPRESSION: Atelectasis or infiltration demonstrated in both lung bases. Colonic dilatation with fluid-filled loops. Transition zone in the mid sigmoid region where there is an area of wall thickening and mild adjacent portal venous gas. Changes may represent stricture, inflammatory bowel disease, focal ischemia, or neoplasm. Fluid-filled small bowel without significant dilatation. Bladder wall thickening may indicate cystitis or outlet obstruction. Surgical absence of the left kidney. These results were called by telephone at the time of interpretation on 06/23/2016 at 5:07 am to Dr. [name] , who verbally acknowledged these results. Electronically Signed   By: Lucienne Capers M.D.   On: 06/23/2016 05:09   Dg Chest Port 1 View  Result Date: 06/23/2016 CLINICAL DATA:  Central line placement and intubation. EXAM: PORTABLE CHEST 1 VIEW COMPARISON:  06/18/2016 FINDINGS: Endotracheal tube with tip is at the lower cervical region, measuring about 8.7 cm above the carina. Right central venous catheter has been placed with tip over the low SVC region. No visible pneumothorax. Enteric tube with tip in the left upper quadrant consistent with location in the stomach. Normal heart size and pulmonary vascularity. Shallow inspiration. No focal airspace disease or consolidation in the lungs. No blunting of costophrenic angles. IMPRESSION:  Endotracheal tube is at the lower cervical region, measuring 8.7 cm from the carina. Appliances otherwise in satisfactory position. No evidence of active pulmonary disease. Electronically Signed   By: Lucienne Capers M.D.   On: 06/23/2016 03:01   Dg Chest Portable 1 View  Result Date: 07/06/2016 CLINICAL DATA:  Altered mental status.  Hypertension. EXAM: PORTABLE CHEST 1 VIEW COMPARISON:  None. FINDINGS: Shallow inspiration. Normal heart size and pulmonary vascularity. No focal airspace disease or consolidation in the lungs. No blunting of costophrenic angles. No pneumothorax. Mediastinal contours appear intact. IMPRESSION: No active disease. Electronically Signed   By: Lucienne Capers M.D.   On: 06/09/2016 23:58    Review of Systems  Unable to perform ROS: Intubated   Blood pressure 133/86, pulse (!) 109, temperature (!) 96.1 F (35.6 C), temperature source Core (Comment), resp. rate (!) 34, SpO2 100 %. Physical Exam  Vitals reviewed. Constitutional: He appears well-developed and well-nourished.  HENT:  Head: Normocephalic and atraumatic.  Right Ear: External ear normal.  Left Ear: External ear normal.  Eyes: Conjunctivae are normal. No scleral icterus.  Muddy sclera  Neck: Normal range of motion. Neck supple. No tracheal deviation present. No thyromegaly present.  Cardiovascular: Normal heart sounds.  Tachycardia present.   Respiratory: Effort normal and breath sounds normal. No respiratory distress. He has no wheezes.  GI: Soft. Normal appearance. There is  no rigidity and no guarding.    Mild abdominal obesity, soft, not rigid, doesn't grimace with deep palpation.   Musculoskeletal: He exhibits no edema or tenderness.  b/l bka  Neurological: He is unresponsive. He exhibits normal muscle tone.  Intubated/sedated  Skin: Skin is warm and dry. No rash noted. He is not diaphoretic. No erythema. No pallor.  Psychiatric:  Unable to assess    Assessment/Plan: 51 yo AAM critically  ill Shock ARF AKI UGI bleed Elevated troponin IDDM DKA Acute blood loss anemia  I reviewed the labs, imaging.  His abdominal exam currently is not that impressive. It is soft. Not rigid, does not grimace to deep palpation. I do not believe the colon is the source of his wbc, elevated lactate after reviewing the CT  Agree with meds for UGI bleed (MW tear, bleeding ulcer??) Agree with GI medicine consult to evaluate for EGD  Wife at bs; explained pt is critically ill Repeat abd exams  Leighton Ruff. Redmond Pulling, MD, FACS General, Bariatric, & Minimally Invasive Surgery [facility], [place]  [facility] M 06/23/2016, 7:01 AM

## 2016-06-23 NOTE — Progress Notes (Addendum)
[facility] Progress Note Patient Name: [name] DOB: [birth year] MRN: [id number]   Date of Service  06/23/2016  HPI/Events of Note  Head CT Scan reveals Diffuse cerebral edema with multiple lucent areas c/w infarcts as edema.  Suspect that he has suffered an anoxic brain injury when he coded several times in the ED.   eICU Interventions  Will order: 1. Neurology consultation - I have spoken with the on call Neurologist.      Intervention Category Major Interventions: Change in mental status - evaluation and management  Sommer,Steven Eugene 06/23/2016, 7:02 PM

## 2016-06-23 NOTE — ED Notes (Signed)
Unit #1 PRBC complete

## 2016-06-23 NOTE — ED Notes (Signed)
Pt arrives to ER lethargic, not responding to verbal stimuli (only painful stimuli); no gag reflex; 15L O2 via NRBand NPA placed by EMS; EMS states no PIV access, attempted I/O but patient removed enroute

## 2016-06-23 NOTE — Progress Notes (Signed)
[facility] - AM rounding note   S:  RN reports hypokalemia, hyperglycemia on insulin gtt    O: Blood pressure (!) 120/58, pulse (!) 129, temperature (!) 100.9 F (38.3 C), temperature source Core (Comment), resp. rate (!) 35, weight 194 lb (88 kg), SpO2 100 %.  General:  Chronically ill appearing male in NAD HEENT: MM pink/moist, ETT Neuro: sedate, pupils 2-65mm sluggish, scleredema  CV: s1s2 rrr, no m/r/g PULM: even/non-labored, lungs bilaterally coarse NF:AOZH, non-tender, bsx4 active  Extremities: warm/dry, no edema  Skin: no rashes or lesions, cool to touch, sweaty   Recent Labs Lab 06/17/2016 2327 07/01/2016 2331 06/23/16 0325 06/23/16 0740  HGB 6.6* 7.5* 8.1* 8.8*  HCT 21.8* 22.0* 25.9* 25.4*  WBC 29.3*  --  30.7* 33.3*  PLT 441*  459*  --  197 376    Recent Labs Lab 06/08/2016 2327 07/02/2016 2331 06/23/16 0324 06/23/16 0325 06/23/16 0550 06/23/16 0740 06/23/16 1003  NA 114* 109*  --  125* 127* 129* 130*  K 5.1 4.8  --  3.6 3.5 3.3* 3.1*  CL 68* 71*  --  86* 90* 86* 88*  CO2 8*  --   --  8* 13* 17* 20*  GLUCOSE 1,361* >700*  --  1,033* 826* 669* 445*  BUN 113* 110*  --  103* 104* 110* 109*  CREATININE 3.67* 3.30*  --  3.34* 3.45* 3.43* 3.57*  CALCIUM 7.9*  --   --  6.6* 6.6* 6.9* 6.8*  MG  --   --  2.6*  --   --   --   --   PHOS  --   --  11.5*  --   --   --   --     A:  GIB  Hemorrhagic Shock / Acute Blood Loss Anemia Cardiac Arrest - 3 episodes of brief arrest in ER Lactic Acidosis  Acute Hypoxic Respiratory Failure  Anion Gap Metabolic Acidosis - suspect lactate with poor perfusion Acute Kidney Injury  Hyponatremia  Hypokalemia  DM with DKA Bilateral Amputee Rule Out Anoxic Injury - prolonged downtime Fever  Leukocytosis  Possible Aspiration - in setting of large volume GIB + arrest  P: PRVC 8 cc/kg Wean PEEP / FiO2 for sats > 92% Follow labs closely > BMP Q2, CBC Q6 Trend BMP, lactate  Replace electrolytes as indicated Avoid nephrotoxic  agents, ensure adequate renal perfusion Propofol for sedation  ABG, CXR in am  Trend fever curve, WBC  Continue insulin gtt Continue empiric abx, Vanco/Zosyn D2/x Pressors to support MAP >65 Bicarb gtt at 134ml/hr CCS/GI following, appreciate input   Feeding: NPO  DVT: SCD's Dispo: ICU  Additional CC Time: 30 minutes.  See prior H&P for other details.    Noe Gens, NP-C [facility] Pgr: [phone] or if no answer [phone] 06/23/2016, 10:57 AM  Attending Note:  I have examined patient, reviewed labs, studies and notes. I have discussed the case with B Ollis, and I agree with the data and plans as amended above.   Remains acidotic on bicarb and insulin gtt's. Hx suggests that he may have endured a prolonged period of ischemia following UGIB at home. Case discussed with Dr Penelope Coop. Holding off on EGD for now unless he shows signs of continued blood loss. Following Hgb. Hopefully will be in position to decrease / stop bicarb gtt soon. Following serial BMP, titrating insulin. Decrease sedation on 3/17 to assess neuro fxn. Brain imaging concerning for possible ischemia.   Independent  critical care time is 35 minutes.   Baltazar Apo, MD, PhD 06/23/2016, 11:46 AM [facility] [phone] or if no answer [phone]

## 2016-06-23 NOTE — Code Documentation (Signed)
Unit 1 PRBC started.

## 2016-06-23 NOTE — Progress Notes (Signed)
CRITICAL VALUE ALERT  Critical value received:  Glucose 826, Trop 3.43  Date of notification:  06/23/2016  Time of notification:  0720  Critical value read back:Yes.    Nurse who received alert:  K. Quick, RN  Rounding MD on floor and notified.

## 2016-06-23 NOTE — ED Notes (Signed)
All documentation on this patient on 07/03/2016 and 06/23/16 was charted in error

## 2016-06-23 NOTE — Progress Notes (Addendum)
Pt BP suddenly drops to SBP 60s, pt on pressors. Pressors titrated up to max dose. Upon reassessment, pupils now 5-6 mm non-reactive, pt remains unresponsive with no movement to painful stimuli. MD paged and made aware. Familly at bedside updated. New orders received. Will continue to monitor closely.

## 2016-06-23 NOTE — Progress Notes (Signed)
Pharmacy Antibiotic Note  [name] is a 51 y.o. male admitted on 07/08/2016 with sepsis.  Pharmacy has been consulted for Vancomycin/Zosyn dosing. Pt found unresponsive at home. Pt with active GIB. Pt s/p cardiac arrest with ROSC in the ED. WBC is elevated.   Plan: Vancomycin 1000 mg IV x 1 given in the ED, then give 500 mg IV q24h, change dose as renal function (Scr/UOP) dictates Zosyn 3.375G IV q8h to be infused over 4 hours Trend WBC, temp, renal function  F/U infectious work-up Drug levels as indicated  Temp (24hrs), Avg:91.1 F (32.8 C), Min:91.1 F (32.8 C), Max:91.1 F (32.8 C)   Recent Labs Lab 06/21/2016 2327 06/23/16 0325  WBC 29.3* 30.7*  CREATININE 3.67*  --     CrCl cannot be calculated (Unknown ideal weight.).    Narda Bonds 06/23/2016 4:08 AM

## 2016-06-24 ENCOUNTER — Inpatient Hospital Stay (HOSPITAL_COMMUNITY): Payer: Medicaid Other

## 2016-06-24 DIAGNOSIS — I469 Cardiac arrest, cause unspecified: Secondary | ICD-10-CM | POA: Diagnosis not present

## 2016-06-24 DIAGNOSIS — Z515 Encounter for palliative care: Secondary | ICD-10-CM

## 2016-06-24 DIAGNOSIS — J9601 Acute respiratory failure with hypoxia: Secondary | ICD-10-CM | POA: Diagnosis not present

## 2016-06-24 DIAGNOSIS — Z66 Do not resuscitate: Secondary | ICD-10-CM

## 2016-06-24 DIAGNOSIS — R4020 Unspecified coma: Secondary | ICD-10-CM

## 2016-06-24 LAB — PREPARE FRESH FROZEN PLASMA: UNIT DIVISION: 0

## 2016-06-24 LAB — GLUCOSE, CAPILLARY
GLUCOSE-CAPILLARY: 158 mg/dL — AB (ref 65–99)
GLUCOSE-CAPILLARY: 120 mg/dL — AB (ref 65–99)
GLUCOSE-CAPILLARY: 207 mg/dL — AB (ref 65–99)
Glucose-Capillary: 137 mg/dL — ABNORMAL HIGH (ref 65–99)
Glucose-Capillary: 193 mg/dL — ABNORMAL HIGH (ref 65–99)
Glucose-Capillary: 227 mg/dL — ABNORMAL HIGH (ref 65–99)
Glucose-Capillary: 221 mg/dL — ABNORMAL HIGH (ref 65–99)
Glucose-Capillary: 184 mg/dL — ABNORMAL HIGH (ref 65–99)
Glucose-Capillary: 188 mg/dL — ABNORMAL HIGH (ref 65–99)

## 2016-06-24 LAB — CBC
HEMATOCRIT: 22.4 % — AB (ref 39.0–52.0)
HEMOGLOBIN: 8.2 g/dL — AB (ref 13.0–17.0)
MCH: 29.8 pg (ref 26.0–34.0)
MCHC: 36.6 g/dL — AB (ref 30.0–36.0)
MCV: 81.5 fL (ref 78.0–100.0)
Platelets: 169 10*3/uL (ref 150–400)
RBC: 2.75 MIL/uL — ABNORMAL LOW (ref 4.22–5.81)
RDW: 13.6 % (ref 11.5–15.5)
WBC: 20.1 10*3/uL — ABNORMAL HIGH (ref 4.0–10.5)

## 2016-06-24 LAB — HEPATITIS PANEL, ACUTE
HEP A IGM: NEGATIVE
HEP B C IGM: NEGATIVE
HEP B S AG: NEGATIVE

## 2016-06-24 LAB — BPAM PLATELET PHERESIS
Blood Product Expiration Date: 201803170450
ISSUE DATE / TIME: 201803160513
UNIT TYPE AND RH: 5100

## 2016-06-24 LAB — BLOOD GAS, ARTERIAL
Acid-Base Excess: 5.7 mmol/L — ABNORMAL HIGH (ref 0.0–2.0)
BICARBONATE: 29.2 mmol/L — AB (ref 20.0–28.0)
Drawn by: 290171
FIO2: 40
LHR: 24 {breaths}/min
O2 Saturation: 96.2 %
PEEP/CPAP: 5 cmH2O
Patient temperature: 98.6
VT: 500 mL
pCO2 arterial: 39.5 mmHg (ref 32.0–48.0)
pH, Arterial: 7.482 — ABNORMAL HIGH (ref 7.350–7.450)
pO2, Arterial: 79.9 mmHg — ABNORMAL LOW (ref 83.0–108.0)

## 2016-06-24 LAB — BASIC METABOLIC PANEL
ANION GAP: 14 (ref 5–15)
BUN: 110 mg/dL — ABNORMAL HIGH (ref 6–20)
CO2: 28 mmol/L (ref 22–32)
Calcium: 6.5 mg/dL — ABNORMAL LOW (ref 8.9–10.3)
Chloride: 92 mmol/L — ABNORMAL LOW (ref 101–111)
Creatinine, Ser: 4.68 mg/dL — ABNORMAL HIGH (ref 0.61–1.24)
GFR, EST AFRICAN AMERICAN: 15 mL/min — AB (ref >60)
GFR, EST NON AFRICAN AMERICAN: 13 mL/min — AB (ref >60)
GLUCOSE: 131 mg/dL — AB (ref 65–99)
POTASSIUM: 2.7 mmol/L — AB (ref 3.5–5.1)
Sodium: 134 mmol/L — ABNORMAL LOW (ref 135–145)

## 2016-06-24 LAB — PREPARE PLATELET PHERESIS: Unit division: 0

## 2016-06-24 LAB — URINE CULTURE
CULTURE: NO GROWTH
Culture: NO GROWTH

## 2016-06-24 LAB — BPAM FFP
Blood Product Expiration Date: 201803212359
ISSUE DATE / TIME: 201803160512
Unit Type and Rh: 5100

## 2016-06-24 LAB — PHOSPHORUS: PHOSPHORUS: 4.2 mg/dL (ref 2.5–4.6)

## 2016-06-24 LAB — MAGNESIUM: MAGNESIUM: 1.7 mg/dL (ref 1.7–2.4)

## 2016-06-24 MED ORDER — SODIUM CHLORIDE 0.9% FLUSH
10.0000 mL | Freq: Two times a day (BID) | INTRAVENOUS | Status: DC
  Administered 2016-06-24: 20 mL
  Administered 2016-06-24: 10 mL

## 2016-06-24 MED ORDER — SODIUM CHLORIDE 0.9 % IV SOLN
0.0000 mg/h | INTRAVENOUS | Status: DC
  Administered 2016-06-24: 2 mg/h via INTRAVENOUS
  Filled 2016-06-24: qty 10

## 2016-06-24 MED ORDER — MORPHINE BOLUS VIA INFUSION: 5.0000 mg | INTRAVENOUS | Status: DC | PRN

## 2016-06-24 MED ORDER — MIDAZOLAM HCL 5 MG/ML IJ SOLN
0.0000 mg/h | INTRAMUSCULAR | Status: DC
  Administered 2016-06-24: 1 mg/h via INTRAVENOUS
  Filled 2016-06-24: qty 10

## 2016-06-24 MED ORDER — SODIUM CHLORIDE 0.9 % IV SOLN
30.0000 meq | Freq: Once | INTRAVENOUS | Status: AC
  Administered 2016-06-24: 30 meq via INTRAVENOUS
  Filled 2016-06-24: qty 15

## 2016-06-24 MED ORDER — MIDAZOLAM BOLUS VIA INFUSION (WITHDRAWAL LIFE SUSTAINING TX): 5.0000 mg | INTRAVENOUS | Status: DC | PRN

## 2016-06-24 MED ORDER — MIDAZOLAM BOLUS VIA INFUSION (WITHDRAWAL LIFE SUSTAINING TX)
5.0000 mg | INTRAVENOUS | Status: DC | PRN
  Filled 2016-06-24: qty 20

## 2016-06-24 MED ORDER — CHLORHEXIDINE GLUCONATE CLOTH 2 % EX PADS
6.0000 | MEDICATED_PAD | Freq: Every day | CUTANEOUS | Status: DC
  Administered 2016-06-24: 6 via TOPICAL

## 2016-06-24 MED ORDER — SODIUM CHLORIDE 0.9% FLUSH: 10.0000 mL | INTRAVENOUS | Status: DC | PRN

## 2016-06-24 MED ORDER — MORPHINE BOLUS VIA INFUSION
5.0000 mg | INTRAVENOUS | Status: DC | PRN
  Filled 2016-06-24: qty 20

## 2016-06-25 LAB — BLOOD CULTURE ID PANEL (REFLEXED)
ACINETOBACTER BAUMANNII: NOT DETECTED
CANDIDA ALBICANS: NOT DETECTED
CANDIDA GLABRATA: NOT DETECTED
CANDIDA KRUSEI: NOT DETECTED
Candida parapsilosis: NOT DETECTED
Candida tropicalis: NOT DETECTED
ENTEROBACTERIACEAE SPECIES: NOT DETECTED
ESCHERICHIA COLI: NOT DETECTED
Enterobacter cloacae complex: NOT DETECTED
Enterococcus species: NOT DETECTED
HAEMOPHILUS INFLUENZAE: NOT DETECTED
Klebsiella oxytoca: NOT DETECTED
Klebsiella pneumoniae: NOT DETECTED
Listeria monocytogenes: NOT DETECTED
Methicillin resistance: DETECTED — AB
Neisseria meningitidis: NOT DETECTED
Proteus species: NOT DETECTED
Pseudomonas aeruginosa: NOT DETECTED
SERRATIA MARCESCENS: NOT DETECTED
STREPTOCOCCUS AGALACTIAE: NOT DETECTED
STREPTOCOCCUS PNEUMONIAE: NOT DETECTED
STREPTOCOCCUS PYOGENES: NOT DETECTED
Staphylococcus aureus (BCID): NOT DETECTED
Staphylococcus species: DETECTED — AB
Streptococcus species: NOT DETECTED

## 2016-06-26 LAB — CULTURE, BLOOD (ROUTINE X 2)

## 2016-06-26 LAB — MYOGLOBIN, URINE: Myoglobin, Ur: 18 ng/mL — ABNORMAL HIGH (ref 0–13)

## 2016-06-27 ENCOUNTER — Telehealth: Payer: Self-pay

## 2016-06-27 LAB — TYPE AND SCREEN
ABO/RH(D): O POS
ANTIBODY SCREEN: NEGATIVE
UNIT DIVISION: 0
UNIT DIVISION: 0
UNIT DIVISION: 0
UNIT DIVISION: 0
UNIT DIVISION: 0
Unit division: 0
Unit division: 0
Unit division: 0
Unit division: 0
Unit division: 0

## 2016-06-27 LAB — BPAM RBC
BLOOD PRODUCT EXPIRATION DATE: 201803212359
BLOOD PRODUCT EXPIRATION DATE: 201804042359
BLOOD PRODUCT EXPIRATION DATE: 201804062359
BLOOD PRODUCT EXPIRATION DATE: 201804062359
BLOOD PRODUCT EXPIRATION DATE: 201804092359
BLOOD PRODUCT EXPIRATION DATE: 201804092359
Blood Product Expiration Date: 201804062359
Blood Product Expiration Date: 201804072359
Blood Product Expiration Date: 201804092359
Blood Product Expiration Date: 201804092359
ISSUE DATE / TIME: 201803160215
ISSUE DATE / TIME: 201803160215
ISSUE DATE / TIME: 201803160246
ISSUE DATE / TIME: 201803160246
ISSUE DATE / TIME: 201803160246
ISSUE DATE / TIME: 201803161538
ISSUE DATE / TIME: 201803162005
UNIT TYPE AND RH: 5100
UNIT TYPE AND RH: 5100
UNIT TYPE AND RH: 5100
UNIT TYPE AND RH: 5100
UNIT TYPE AND RH: 5100
UNIT TYPE AND RH: 5100
Unit Type and Rh: 5100
Unit Type and Rh: 5100
Unit Type and Rh: 5100
Unit Type and Rh: 5100

## 2016-06-27 NOTE — Telephone Encounter (Signed)
Post ED Visit - Positive Culture Follow-up  Culture report reviewed by antimicrobial stewardship pharmacist:  []  Elenor Quinones, Pharm.D. []  Heide Guile, Pharm.D., BCPS AQ-ID []  Parks Neptune, Pharm.D., BCPS []  Alycia Rossetti, Pharm.D., BCPS []  D[place]er, [place].D., BCPS, AAHIVP []  Legrand Como, Pharm.D., BCPS, AAHIVP []  Salome Arnt, PharmD, BCPS []  Dimitri Ped, PharmD, BCPS []  Vincenza Hews, PharmD, BCPS  Positive blood culture Patient is deceased   Hazle Nordmann 10-Jul-2016, 11:46 AM

## 2016-06-27 NOTE — Telephone Encounter (Signed)
On 06/27/16 I received a death certificate from [facility] (original). The death certificate is for cremation. The patient is a patient of Doctor Ramaswamy. The death certificate will be taken to the Pulmonary Unit @ [facility] for signature.  On 07-05-16 I received the death certificate back from Doctor Ramaswamy. I got the death certificate ready and called the funeral home to let them know the death certificate is ready for pickup. I also faxed a copy to the funeral home per the funeral home request.

## 2016-06-28 LAB — CULTURE, BLOOD (ROUTINE X 2): Culture: NO GROWTH

## 2016-07-04 NOTE — Discharge Summary (Signed)
DISCHARGE SUMMARY    Date of admit: 06/18/2016 11:36 PM Date of discharge: 2016-06-30   PCP is No primary care provider on file.   PROBLEM LIST Active Problems:   GI bleed   Acute respiratory failure with hypoxia (HCC)   Acute upper GI bleed   Cardiac arrest (HCC)   Diabetic acidosis ([place])   Coma ([place])   DNR (do not resuscitate)   Terminal care    SUMMARY LC JOYNT was 51 y.o. 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); Cellulitis and abscess of leg (01/31/2014); Cellulitis of left foot (03/2008); CKD (chronic kidney disease) stage 2, GFR 60-89 ml/min; Depression; Diabetic foot ulcer ([place]); Diabetic peripheral neuropathy ([place]); Exposure to trichomonas; GERD (gastroesophageal reflux disease); History of chronic pyelonephritis; History of drug abuse; History of left below knee amputation ([place]) (01/21/2014); History of prolonged Q-T interval on ECG; Hyperlipidemia; Hypertension; Mack Guise tear (April 2009); Onychomycosis; Osteomyelitis of left leg (HCC) (01/21/2014); Peripheral vascular disease ([place]); 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   has a past surgical history that includes Toe amputation (Left, 06/2011); Nephrectomy (Left); I&D extremity (08/24/2011); Amputation (08/24/2011); Amputation (12/05/2011); I&D extremity (02/18/2012); Amputation (02/18/2012); I&D extremity (Left, 08/28/2012); I&D extremity (Left, 08/30/2012); Amputation (Right, 09/01/2012); Amputation (Left, 09/01/2012); Amputation (Right, 09/13/2012); Amputation (Left, 12/12/2013); Stump revision (Left, 01/23/2014); Esophagogastroduodenoscopy (egd) with propofol (N/A, 09/16/2014); and  Below knee leg amputation (Left, 12/12/2013).   Admitted on 06/08/2016 with   51yo male presents to ED from home after cousin found patient unresponsive in bathroom and called 911. No family present on arrival and patient had no ID. Glucose >700, Hemoglobin 6.6, patient unresponsive, intubated in ED for airway protection. Estimated 1.5L of bloody output from OG tube. While in the ED patient coded 3 times for a total of about 15 minutes total. Went into [place] and was shock a total of 3 times. Went into [place], given 2 mg Magnesium. PCCM asked to admit.   COURSE On 2016-06-30 - showed evidence of severe anoxic brain injury. Patient terminally weaned after family discussion and expired  Dr. Brand Males, M.D., [facility].C.P Pulmonary and Critical Care Medicine Staff Physician [facility] Pager: [phone], If no answer or between  15:00h - 7:00h: call [phone]  07/04/2016 3:05 AM

## 2016-07-09 NOTE — Progress Notes (Signed)
Subjective: Intubated;   Objective: Vital signs in last 24 hours: Temp:  [98.1 F (36.7 C)-99.7 F (37.6 C)] 98.1 F (36.7 C) (03/17 0900) Pulse Rate:  [97-163] 98 (03/17 1138) Resp:  [0-37] 24 (03/17 1138) BP: (66-143)/(50-68) 66/50 (03/16 1620) SpO2:  [100 %] 100 % (03/17 1138) Arterial Line BP: (66-195)/(44-86) 117/58 (03/17 0900) FiO2 (%):  [40 %] 40 % (03/17 1138) Weight:  [86.3 kg (190 lb 4.1 oz)] 86.3 kg (190 lb 4.1 oz) (03/17 0500) Last BM Date: 2016/07/16  Intake/Output from previous day: 03/16 0701 - 03/17 0700 In: 5004 [I.V.:4411; NG/GT:90; IV Piggyback:503] Out: 2865 [Urine:2315; Emesis/NG output:550] Intake/Output this shift: Total I/O In: 264.5 [I.V.:88.5; IV Piggyback:176] Out: 290 [Urine:290] Per GI evaluation - no abdominal tenderness indicated Lab Results:   Recent Labs  06/23/16 1400 07-16-2016 0400  WBC 34.1* 20.1*  HGB 9.0* 8.2*  HCT 24.4* 22.4*  PLT 300 169   BMET  Recent Labs  06/23/16 1614 Jul 16, 2016 0400  NA 135 134*  K 3.3* 2.7*  CL 93* 92*  CO2 27 28  GLUCOSE 121* 131*  BUN 110* 110*  CREATININE 3.88* 4.68*  CALCIUM 6.8* 6.5*   PT/INR  Recent Labs  06/27/2016 2327  LABPROT 19.1*  19.6*  INR 1.59  1.64   ABG  Recent Labs  06/23/16 1721 16-Jul-2016 0406  PHART 7.449 7.482*  HCO3 29.3* 29.2*    Studies/Results: Ct Abdomen Pelvis Wo Contrast  Result Date: 06/23/2016 CLINICAL DATA:  Patient was found unresponsive and respiratory arrest at home. Seizure-like activity. GI bleeding from the rectum, nose, and mouth. EXAM: CT CHEST, ABDOMEN AND PELVIS WITHOUT CONTRAST TECHNIQUE: Multidetector CT imaging of the chest, abdomen and pelvis was performed following the standard protocol without IV contrast. COMPARISON:  None. FINDINGS: CT CHEST FINDINGS Cardiovascular: Normal heart size. No pericardial effusion. Normal caliber thoracic aorta. Mediastinum/Nodes: Endotracheal and enteric tubes are present. A right central venous catheter  with tip in the low SVC. Esophagus is decompressed. No significant lymphadenopathy in the chest. Lungs/Pleura: Evaluation of lungs is limited due to respiratory motion artifact. Areas of consolidation or atelectasis demonstrated in the lung bases. No pleural effusions. No pneumothorax. Mild emphysematous changes in the lung apices. Musculoskeletal: No chest wall mass or suspicious bone lesions identified. CT ABDOMEN PELVIS FINDINGS Hepatobiliary: Increased density in the gallbladder may indicate sludge. No bile duct dilatation. No focal liver lesions. Pancreas: Unremarkable. No pancreatic ductal dilatation or surrounding inflammatory changes. Spleen: Normal in size without focal abnormality. Adrenals/Urinary Tract: Surgical absence of the left kidney. No hydronephrosis or hydroureter on the right. Calcification in the adrenal glands, likely representing evidence of old hemorrhage or infection. Mild diffuse thickening of the bladder wall could indicate cystitis or bladder outlet obstruction. Stomach/Bowel: Colon is mildly distended and fluid filled with air-fluid levels. Transition zone is seen in the mid sigmoid region. There is wall thickening suggesting stricture or inflammatory bowel disease. Colonic neoplasm not entirely excluded. Small amount of portal venous gas is suggested in the pelvis adjacent to the colon. Focal ischemia or micro perforation could have this appearance. Fluid-filled small bowel without significant dilatation. Stomach is decompressed. Small ventral abdominal wall hernia containing a portion of the colon but without obstruction at this level. Vascular/Lymphatic: No significant vascular findings are present. No enlarged abdominal or pelvic lymph nodes. Reproductive: Prostate is unremarkable. Other: No free air or free fluid demonstrated in the abdomen. Musculoskeletal: No acute or significant osseous findings. IMPRESSION: Atelectasis or infiltration demonstrated in both lung bases. Colonic  dilatation with fluid-filled loops. Transition zone in the mid sigmoid region where there is an area of wall thickening and mild adjacent portal venous gas. Changes may represent stricture, inflammatory bowel disease, focal ischemia, or neoplasm. Fluid-filled small bowel without significant dilatation. Bladder wall thickening may indicate cystitis or outlet obstruction. Surgical absence of the left kidney. These results were called by telephone at the time of interpretation on 06/23/2016 at 5:07 am to Dr. [name] , who verbally acknowledged these results. Electronically Signed   By: Lucienne Capers M.D.   On: 06/23/2016 05:09   Ct Head Wo Contrast  Addendum Date: 06/23/2016   ADDENDUM REPORT: 06/23/2016 18:54 ADDENDUM: These results were called by telephone at the time of interpretation on 06/23/2016 at 6:54 pm to Dr. Boone Master , who verbally acknowledged these results. Electronically Signed   By: Kristine Garbe M.D.   On: 06/23/2016 18:54   Result Date: 06/23/2016 CLINICAL DATA:  51 y/o  M; found unresponsive, anisocoria. EXAM: CT HEAD WITHOUT CONTRAST TECHNIQUE: Contiguous axial images were obtained from the base of the skull through the vertex without intravenous contrast. COMPARISON:  06/23/2016 CT of the head. FINDINGS: Brain: Interval development of multiple areas of cortical hypoattenuation confluent in bilateral occipital and right temporal lobes with additional lesions in the basal ganglia. Interval development of diffuse sulcal effacement, ventricle effacement, and basilar cistern effacement compatible with cerebral edema. No definite intracranial hemorrhage. Vascular: No unexpected calcification. Skull: Normal. Negative for fracture or focal lesion. Sinuses/Orbits: Left maxillary sinus mucous retention cyst. Fluid within the nasopharynx and right maxillary sinus fluid level is likely due to intubation. Mild stable bilateral proptosis peer Other: None. IMPRESSION: Interval  development of diffuse cerebral edema and multiple cortical lucencies probably representing hypoxic brain injury or severe cerebritis. No downward herniation at this time. No acute intracranial hemorrhage identified. Electronically Signed: By: Kristine Garbe M.D. On: 06/23/2016 18:40   Ct Head Wo Contrast  Result Date: 06/23/2016 CLINICAL DATA:  Found unresponsive, seizure like activity. GI bleed. EXAM: CT HEAD WITHOUT CONTRAST TECHNIQUE: Contiguous axial images were obtained from the base of the skull through the vertex without intravenous contrast. COMPARISON:  None. FINDINGS: Multiple lines overlying the patient's head result in streak artifact. BRAIN: No intraparenchymal hemorrhage, mass effect nor midline shift. The ventricles and sulci are normal. No acute large vascular territory infarcts. Faint linear hypodensity RIGHT frontoparietal lobes. No abnormal extra-axial fluid collections. Basal cisterns are patent. VASCULAR: Unremarkable. SKULL/SOFT TISSUES: No skull fracture. No significant soft tissue swelling. Small RIGHT frontal scalp lipoma. ORBITS/SINUSES: Proptosis. Old RIGHT medial orbital blowout fracture. Mild paranasal sinus mucosal thickening. Small RIGHT mastoid effusion. OTHER: Life-support lines in place. IMPRESSION: Linear RIGHT frontoparietal hypodensity could represent artifact, acute infarct or, cerebritis. These results will be called to the ordering clinician or representative by the Radiologist Assistant, and communication documented in the [facility] Dashboard. Electronically Signed   By: Elon Alas M.D.   On: 06/23/2016 04:56   Ct Chest Wo Contrast  Result Date: 06/23/2016 CLINICAL DATA:  Patient was found unresponsive and respiratory arrest at home. Seizure-like activity. GI bleeding from the rectum, nose, and mouth. EXAM: CT CHEST, ABDOMEN AND PELVIS WITHOUT CONTRAST TECHNIQUE: Multidetector CT imaging of the chest, abdomen and pelvis was performed following the  standard protocol without IV contrast. COMPARISON:  None. FINDINGS: CT CHEST FINDINGS Cardiovascular: Normal heart size. No pericardial effusion. Normal caliber thoracic aorta. Mediastinum/Nodes: Endotracheal and enteric tubes are present. A right central venous catheter with tip in the  low SVC. Esophagus is decompressed. No significant lymphadenopathy in the chest. Lungs/Pleura: Evaluation of lungs is limited due to respiratory motion artifact. Areas of consolidation or atelectasis demonstrated in the lung bases. No pleural effusions. No pneumothorax. Mild emphysematous changes in the lung apices. Musculoskeletal: No chest wall mass or suspicious bone lesions identified. CT ABDOMEN PELVIS FINDINGS Hepatobiliary: Increased density in the gallbladder may indicate sludge. No bile duct dilatation. No focal liver lesions. Pancreas: Unremarkable. No pancreatic ductal dilatation or surrounding inflammatory changes. Spleen: Normal in size without focal abnormality. Adrenals/Urinary Tract: Surgical absence of the left kidney. No hydronephrosis or hydroureter on the right. Calcification in the adrenal glands, likely representing evidence of old hemorrhage or infection. Mild diffuse thickening of the bladder wall could indicate cystitis or bladder outlet obstruction. Stomach/Bowel: Colon is mildly distended and fluid filled with air-fluid levels. Transition zone is seen in the mid sigmoid region. There is wall thickening suggesting stricture or inflammatory bowel disease. Colonic neoplasm not entirely excluded. Small amount of portal venous gas is suggested in the pelvis adjacent to the colon. Focal ischemia or micro perforation could have this appearance. Fluid-filled small bowel without significant dilatation. Stomach is decompressed. Small ventral abdominal wall hernia containing a portion of the colon but without obstruction at this level. Vascular/Lymphatic: No significant vascular findings are present. No enlarged  abdominal or pelvic lymph nodes. Reproductive: Prostate is unremarkable. Other: No free air or free fluid demonstrated in the abdomen. Musculoskeletal: No acute or significant osseous findings. IMPRESSION: Atelectasis or infiltration demonstrated in both lung bases. Colonic dilatation with fluid-filled loops. Transition zone in the mid sigmoid region where there is an area of wall thickening and mild adjacent portal venous gas. Changes may represent stricture, inflammatory bowel disease, focal ischemia, or neoplasm. Fluid-filled small bowel without significant dilatation. Bladder wall thickening may indicate cystitis or outlet obstruction. Surgical absence of the left kidney. These results were called by telephone at the time of interpretation on 06/23/2016 at 5:07 am to Dr. [name] , who verbally acknowledged these results. Electronically Signed   By: Lucienne Capers M.D.   On: 06/23/2016 05:09   Dg Chest Port 1 View  Result Date: 07/01/2016 CLINICAL DATA:  Acute respiratory failure EXAM: PORTABLE CHEST 1 VIEW COMPARISON:  Chest x-ray and CT scan from yesterday FINDINGS: The distal ET tube terminates 3 cm below the thoracic inlet and 10 cm above the carina. No pneumothorax. Stable right central line. Increasing right perihilar infiltrate. No other interval changes. IMPRESSION: 1. The ETT is relatively high but within normal limits, 3 cm below the thoracic inlet. 2. Increasing infiltrate in the right perihilar region. Electronically Signed   By: Dorise Bullion III M.D   On: July 01, 2016 09:03   Dg Chest Port 1 View  Result Date: 06/23/2016 CLINICAL DATA:  Central line placement and intubation. EXAM: PORTABLE CHEST 1 VIEW COMPARISON:  06/08/2016 FINDINGS: Endotracheal tube with tip is at the lower cervical region, measuring about 8.7 cm above the carina. Right central venous catheter has been placed with tip over the low SVC region. No visible pneumothorax. Enteric tube with tip in the left upper  quadrant consistent with location in the stomach. Normal heart size and pulmonary vascularity. Shallow inspiration. No focal airspace disease or consolidation in the lungs. No blunting of costophrenic angles. IMPRESSION: Endotracheal tube is at the lower cervical region, measuring 8.7 cm from the carina. Appliances otherwise in satisfactory position. No evidence of active pulmonary disease. Electronically Signed   By: Lucienne Capers  M.D.   On: 06/23/2016 03:01   Dg Chest Portable 1 View  Result Date: 06/17/2016 CLINICAL DATA:  Altered mental status.  Hypertension. EXAM: PORTABLE CHEST 1 VIEW COMPARISON:  None. FINDINGS: Shallow inspiration. Normal heart size and pulmonary vascularity. No focal airspace disease or consolidation in the lungs. No blunting of costophrenic angles. No pneumothorax. Mediastinal contours appear intact. IMPRESSION: No active disease. Electronically Signed   By: Lucienne Capers M.D.   On: 06/21/2016 23:58    Anti-infectives: Anti-infectives    Start     Dose/Rate Route Frequency Ordered Stop   Jun 30, 2016 2200  vancomycin (VANCOCIN) IVPB 1000 mg/200 mL premix     1,000 mg 200 mL/hr over 60 Minutes Intravenous Every 48 hours 06/23/16 1512     2016/06/30 0600  vancomycin (VANCOCIN) 500 mg in sodium chloride 0.9 % 100 mL IVPB  Status:  Discontinued     500 mg 100 mL/hr over 60 Minutes Intravenous Every 24 hours 06/23/16 0423 06/23/16 1512   06/23/16 1400  piperacillin-tazobactam (ZOSYN) IVPB 3.375 g     3.375 g 12.5 mL/hr over 240 Minutes Intravenous Every 8 hours 06/23/16 0423     06/23/16 0100  piperacillin-tazobactam (ZOSYN) IVPB 3.375 g     3.375 g 100 mL/hr over 30 Minutes Intravenous  Once 06/23/16 0055     06/23/16 0100  vancomycin (VANCOCIN) IVPB 1000 mg/200 mL premix     1,000 mg 200 mL/hr over 60 Minutes Intravenous  Once 06/23/16 0055 06/23/16 0235      Assessment/Plan: - GI bleed - stable Hgb - Cardiac arrest requiring resuscitation. - Diffuse cerebral  edema. Possible anoxic brain injury. - Elevated troponins. > 65  - Acute kidney injury - Respiratory failure - Hypotension  Recommendations: Awaiting GI evaluation  Poor prognosis    LOS: 1 day    Isaly Fasching K. 2016/06/30

## 2016-07-09 NOTE — Progress Notes (Signed)
CRITICAL VALUE ALERT  Critical value received:  K 2.7  Date of notification:  07/16/2016  Time of notification:  0450  Critical value read back:Yes.    Nurse who received alert:  Rhina Brackett  MD notified (1st page):  Dr. Elsworth Soho  Time of first page:  [phone] called e-link  MD notified (2nd page):  Time of second page:  Responding MD:  Dr. Elsworth Soho  Time MD responded:  [phone]

## 2016-07-09 NOTE — Progress Notes (Signed)
PHARMACY - PHYSICIAN COMMUNICATION CRITICAL VALUE ALERT - BLOOD CULTURE IDENTIFICATION (BCID)  Results for orders placed or performed during the hospital encounter of 06/19/2016  Blood Culture ID Panel (Reflexed) (Collected: 07/03/2016 11:40 PM)  Result Value Ref Range   Enterococcus species NOT DETECTED NOT DETECTED   Listeria monocytogenes NOT DETECTED NOT DETECTED   Staphylococcus species DETECTED (A) NOT DETECTED   Staphylococcus aureus NOT DETECTED NOT DETECTED   Methicillin resistance DETECTED (A) NOT DETECTED   Streptococcus species NOT DETECTED NOT DETECTED   Streptococcus agalactiae NOT DETECTED NOT DETECTED   Streptococcus pneumoniae NOT DETECTED NOT DETECTED   Streptococcus pyogenes NOT DETECTED NOT DETECTED   Acinetobacter baumannii NOT DETECTED NOT DETECTED   Enterobacteriaceae species NOT DETECTED NOT DETECTED   Enterobacter cloacae complex NOT DETECTED NOT DETECTED   Escherichia coli NOT DETECTED NOT DETECTED   Klebsiella oxytoca NOT DETECTED NOT DETECTED   Klebsiella pneumoniae NOT DETECTED NOT DETECTED   Proteus species NOT DETECTED NOT DETECTED   Serratia marcescens NOT DETECTED NOT DETECTED   Haemophilus influenzae NOT DETECTED NOT DETECTED   Neisseria meningitidis NOT DETECTED NOT DETECTED   Pseudomonas aeruginosa NOT DETECTED NOT DETECTED   Candida albicans NOT DETECTED NOT DETECTED   Candida glabrata NOT DETECTED NOT DETECTED   Candida krusei NOT DETECTED NOT DETECTED   Candida parapsilosis NOT DETECTED NOT DETECTED   Candida tropicalis NOT DETECTED NOT DETECTED    Name of physician (or Provider) Contacted: Rexene Edison, NP  Changes to prescribed antibiotics required: No changes needed at this time.  Shakora Nordquist, Rande Lawman 29-Jun-2016  12:13 PM

## 2016-07-09 NOTE — Progress Notes (Signed)
Pt extubated, per order, using withdrawal guidelines.  No distress noted.  RN at bedside.

## 2016-07-09 NOTE — Progress Notes (Signed)
Patient with no heart rate or respiratory effort, verified by 2 RNs per protocol (Crystal Rice and Carleene Cooper) at [phone].  MD and CDS notified. Wasted 225 cc morphine drip and 35 cc versed drip in sink followed by water flush.

## 2016-07-09 NOTE — Progress Notes (Signed)
STAFF NOTE: I, Dr Ann Lions have personally reviewed patient's available data, including medical history, events of note, physical examination and test results as part of my evaluation. I have discussed with resident/NP and other care providers such as pharmacist, RN and RRT.  In addition,  I personally evaluated patient and elicited key findings of   S: see APP note for details. LOS 1 days. Comatose on vent. Wife at bedside asking timing for terminal wean based on earlier discussions with aPP  O: Comtaose Pupils fixed and dilated Sync with vent.RR 9 with vent and patient - no breathing over vent No cough No gag No pupil response No coreneral per RN CTA bilaterally    PULMONARY  Recent Labs Lab 06/29/2016 2331 07/07/2016 2334 06/23/16 0334 06/23/16 0640 06/23/16 1721 26-Jun-2016 0406  PHART  --  7.098* 6.887* 7.321* 7.449 7.482*  PCO2ART  --  32.0 38.1 29.2* 42.4 39.5  PO2ART  --  358.0* 425.0* 102 77.0* 79.9*  HCO3  --  9.9* 7.8* 14.9* 29.3* 29.2*  TCO2 12 11 9   --  31  --   O2SAT  --  100.0 100.0 97.1 96.0 96.2    CBC  Recent Labs Lab 06/23/16 0740 06/23/16 1400 Jun 26, 2016 0400  HGB 8.8* 9.0* 8.2*  HCT 25.4* 24.4* 22.4*  WBC 33.3* 34.1* 20.1*  PLT 376 300 169    COAGULATION  Recent Labs Lab 07/08/2016 2327  INR 1.59  1.64    CARDIAC   Recent Labs Lab 06/28/2016 2327 06/23/16 0550 06/23/16 1216 06/23/16 1614  TROPONINI 0.07* 3.43* 64.29* >65.00*   No results for input(s): PROBNP in the last 168 hours.   CHEMISTRY  Recent Labs Lab 06/23/16 0324  06/23/16 1003 06/23/16 1216 06/23/16 1439 06/23/16 1614 2016-06-26 0400  NA  --   < > 130* 134* 135 135 134*  K  --   < > 3.1* 3.2* 3.3* 3.3* 2.7*  CL  --   < > 88* 93* 91* 93* 92*  CO2  --   < > 20* 23 26 27 28   GLUCOSE  --   < > 445* 197* 121* 121* 131*  BUN  --   < > 109* 109* 111* 110* 110*  CREATININE  --   < > 3.57* 3.62* 3.78* 3.88* 4.68*  CALCIUM  --   < > 6.8* 6.9* 6.8* 6.8* 6.5*  MG 2.6*  --    --   --   --   --  1.7  PHOS 11.5*  --   --   --   --   --  4.2  < > = values in this interval not displayed. CrCl cannot be calculated (Unknown ideal weight.).   LIVER  Recent Labs Lab 06/21/2016 2327  AST 36  ALT 22  ALKPHOS 73  BILITOT 0.5  PROT 5.2*  ALBUMIN 2.9*  INR 1.59  1.64     INFECTIOUS  Recent Labs Lab 06/23/16 0740 06/23/16 1400 06/23/16 1700  LATICACIDVEN 9.0* 3.5* 2.7*     ENDOCRINE CBG (last 3)   Recent Labs  06/23/16 2252 06-26-2016 0402 06/26/2016 0817  GLUCAP 158* 120* 188*         IMAGING x48h  - image(s) personally visualized  -   highlighted in bold Ct Abdomen Pelvis Wo Contrast  Result Date: 06/23/2016 CLINICAL DATA:  Patient was found unresponsive and respiratory arrest at home. Seizure-like activity. GI bleeding from the rectum, nose, and mouth. EXAM: CT CHEST, ABDOMEN AND PELVIS WITHOUT CONTRAST  TECHNIQUE: Multidetector CT imaging of the chest, abdomen and pelvis was performed following the standard protocol without IV contrast. COMPARISON:  None. FINDINGS: CT CHEST FINDINGS Cardiovascular: Normal heart size. No pericardial effusion. Normal caliber thoracic aorta. Mediastinum/Nodes: Endotracheal and enteric tubes are present. A right central venous catheter with tip in the low SVC. Esophagus is decompressed. No significant lymphadenopathy in the chest. Lungs/Pleura: Evaluation of lungs is limited due to respiratory motion artifact. Areas of consolidation or atelectasis demonstrated in the lung bases. No pleural effusions. No pneumothorax. Mild emphysematous changes in the lung apices. Musculoskeletal: No chest wall mass or suspicious bone lesions identified. CT ABDOMEN PELVIS FINDINGS Hepatobiliary: Increased density in the gallbladder may indicate sludge. No bile duct dilatation. No focal liver lesions. Pancreas: Unremarkable. No pancreatic ductal dilatation or surrounding inflammatory changes. Spleen: Normal in size without focal abnormality.  Adrenals/Urinary Tract: Surgical absence of the left kidney. No hydronephrosis or hydroureter on the right. Calcification in the adrenal glands, likely representing evidence of old hemorrhage or infection. Mild diffuse thickening of the bladder wall could indicate cystitis or bladder outlet obstruction. Stomach/Bowel: Colon is mildly distended and fluid filled with air-fluid levels. Transition zone is seen in the mid sigmoid region. There is wall thickening suggesting stricture or inflammatory bowel disease. Colonic neoplasm not entirely excluded. Small amount of portal venous gas is suggested in the pelvis adjacent to the colon. Focal ischemia or micro perforation could have this appearance. Fluid-filled small bowel without significant dilatation. Stomach is decompressed. Small ventral abdominal wall hernia containing a portion of the colon but without obstruction at this level. Vascular/Lymphatic: No significant vascular findings are present. No enlarged abdominal or pelvic lymph nodes. Reproductive: Prostate is unremarkable. Other: No free air or free fluid demonstrated in the abdomen. Musculoskeletal: No acute or significant osseous findings. IMPRESSION: Atelectasis or infiltration demonstrated in both lung bases. Colonic dilatation with fluid-filled loops. Transition zone in the mid sigmoid region where there is an area of wall thickening and mild adjacent portal venous gas. Changes may represent stricture, inflammatory bowel disease, focal ischemia, or neoplasm. Fluid-filled small bowel without significant dilatation. Bladder wall thickening may indicate cystitis or outlet obstruction. Surgical absence of the left kidney. These results were called by telephone at the time of interpretation on 06/23/2016 at 5:07 am to Dr. [name] , who verbally acknowledged these results. Electronically Signed   By: Lucienne Capers M.D.   On: 06/23/2016 05:09   Ct Head Wo Contrast  Addendum Date: 06/23/2016    ADDENDUM REPORT: 06/23/2016 18:54 ADDENDUM: These results were called by telephone at the time of interpretation on 06/23/2016 at 6:54 pm to Dr. Boone Master , who verbally acknowledged these results. Electronically Signed   By: Kristine Garbe M.D.   On: 06/23/2016 18:54   Result Date: 06/23/2016 CLINICAL DATA:  51 y/o  M; found unresponsive, anisocoria. EXAM: CT HEAD WITHOUT CONTRAST TECHNIQUE: Contiguous axial images were obtained from the base of the skull through the vertex without intravenous contrast. COMPARISON:  06/23/2016 CT of the head. FINDINGS: Brain: Interval development of multiple areas of cortical hypoattenuation confluent in bilateral occipital and right temporal lobes with additional lesions in the basal ganglia. Interval development of diffuse sulcal effacement, ventricle effacement, and basilar cistern effacement compatible with cerebral edema. No definite intracranial hemorrhage. Vascular: No unexpected calcification. Skull: Normal. Negative for fracture or focal lesion. Sinuses/Orbits: Left maxillary sinus mucous retention cyst. Fluid within the nasopharynx and right maxillary sinus fluid level is likely due to intubation. Mild stable  bilateral proptosis peer Other: None. IMPRESSION: Interval development of diffuse cerebral edema and multiple cortical lucencies probably representing hypoxic brain injury or severe cerebritis. No downward herniation at this time. No acute intracranial hemorrhage identified. Electronically Signed: By: Kristine Garbe M.D. On: 06/23/2016 18:40   Ct Head Wo Contrast  Result Date: 06/23/2016 CLINICAL DATA:  Found unresponsive, seizure like activity. GI bleed. EXAM: CT HEAD WITHOUT CONTRAST TECHNIQUE: Contiguous axial images were obtained from the base of the skull through the vertex without intravenous contrast. COMPARISON:  None. FINDINGS: Multiple lines overlying the patient's head result in streak artifact. BRAIN: No intraparenchymal  hemorrhage, mass effect nor midline shift. The ventricles and sulci are normal. No acute large vascular territory infarcts. Faint linear hypodensity RIGHT frontoparietal lobes. No abnormal extra-axial fluid collections. Basal cisterns are patent. VASCULAR: Unremarkable. SKULL/SOFT TISSUES: No skull fracture. No significant soft tissue swelling. Small RIGHT frontal scalp lipoma. ORBITS/SINUSES: Proptosis. Old RIGHT medial orbital blowout fracture. Mild paranasal sinus mucosal thickening. Small RIGHT mastoid effusion. OTHER: Life-support lines in place. IMPRESSION: Linear RIGHT frontoparietal hypodensity could represent artifact, acute infarct or, cerebritis. These results will be called to the ordering clinician or representative by the Radiologist Assistant, and communication documented in the [facility] Dashboard. Electronically Signed   By: Elon Alas M.D.   On: 06/23/2016 04:56   Ct Chest Wo Contrast  Result Date: 06/23/2016 CLINICAL DATA:  Patient was found unresponsive and respiratory arrest at home. Seizure-like activity. GI bleeding from the rectum, nose, and mouth. EXAM: CT CHEST, ABDOMEN AND PELVIS WITHOUT CONTRAST TECHNIQUE: Multidetector CT imaging of the chest, abdomen and pelvis was performed following the standard protocol without IV contrast. COMPARISON:  None. FINDINGS: CT CHEST FINDINGS Cardiovascular: Normal heart size. No pericardial effusion. Normal caliber thoracic aorta. Mediastinum/Nodes: Endotracheal and enteric tubes are present. A right central venous catheter with tip in the low SVC. Esophagus is decompressed. No significant lymphadenopathy in the chest. Lungs/Pleura: Evaluation of lungs is limited due to respiratory motion artifact. Areas of consolidation or atelectasis demonstrated in the lung bases. No pleural effusions. No pneumothorax. Mild emphysematous changes in the lung apices. Musculoskeletal: No chest wall mass or suspicious bone lesions identified. CT ABDOMEN PELVIS  FINDINGS Hepatobiliary: Increased density in the gallbladder may indicate sludge. No bile duct dilatation. No focal liver lesions. Pancreas: Unremarkable. No pancreatic ductal dilatation or surrounding inflammatory changes. Spleen: Normal in size without focal abnormality. Adrenals/Urinary Tract: Surgical absence of the left kidney. No hydronephrosis or hydroureter on the right. Calcification in the adrenal glands, likely representing evidence of old hemorrhage or infection. Mild diffuse thickening of the bladder wall could indicate cystitis or bladder outlet obstruction. Stomach/Bowel: Colon is mildly distended and fluid filled with air-fluid levels. Transition zone is seen in the mid sigmoid region. There is wall thickening suggesting stricture or inflammatory bowel disease. Colonic neoplasm not entirely excluded. Small amount of portal venous gas is suggested in the pelvis adjacent to the colon. Focal ischemia or micro perforation could have this appearance. Fluid-filled small bowel without significant dilatation. Stomach is decompressed. Small ventral abdominal wall hernia containing a portion of the colon but without obstruction at this level. Vascular/Lymphatic: No significant vascular findings are present. No enlarged abdominal or pelvic lymph nodes. Reproductive: Prostate is unremarkable. Other: No free air or free fluid demonstrated in the abdomen. Musculoskeletal: No acute or significant osseous findings. IMPRESSION: Atelectasis or infiltration demonstrated in both lung bases. Colonic dilatation with fluid-filled loops. Transition zone in the mid sigmoid region where there is an  area of wall thickening and mild adjacent portal venous gas. Changes may represent stricture, inflammatory bowel disease, focal ischemia, or neoplasm. Fluid-filled small bowel without significant dilatation. Bladder wall thickening may indicate cystitis or outlet obstruction. Surgical absence of the left kidney. These results were  called by telephone at the time of interpretation on 06/23/2016 at 5:07 am to Dr. [name] , who verbally acknowledged these results. Electronically Signed   By: Lucienne Capers M.D.   On: 06/23/2016 05:09   Dg Chest Port 1 View  Result Date: 07/22/2016 CLINICAL DATA:  Acute respiratory failure EXAM: PORTABLE CHEST 1 VIEW COMPARISON:  Chest x-ray and CT scan from yesterday FINDINGS: The distal ET tube terminates 3 cm below the thoracic inlet and 10 cm above the carina. No pneumothorax. Stable right central line. Increasing right perihilar infiltrate. No other interval changes. IMPRESSION: 1. The ETT is relatively high but within normal limits, 3 cm below the thoracic inlet. 2. Increasing infiltrate in the right perihilar region. Electronically Signed   By: Dorise Bullion III M.D   On: 22-Jul-2016 09:03   Dg Chest Port 1 View  Result Date: 06/23/2016 CLINICAL DATA:  Central line placement and intubation. EXAM: PORTABLE CHEST 1 VIEW COMPARISON:  06/21/2016 FINDINGS: Endotracheal tube with tip is at the lower cervical region, measuring about 8.7 cm above the carina. Right central venous catheter has been placed with tip over the low SVC region. No visible pneumothorax. Enteric tube with tip in the left upper quadrant consistent with location in the stomach. Normal heart size and pulmonary vascularity. Shallow inspiration. No focal airspace disease or consolidation in the lungs. No blunting of costophrenic angles. IMPRESSION: Endotracheal tube is at the lower cervical region, measuring 8.7 cm from the carina. Appliances otherwise in satisfactory position. No evidence of active pulmonary disease. Electronically Signed   By: Lucienne Capers M.D.   On: 06/23/2016 03:01   Dg Chest Portable 1 View  Result Date: 07/02/2016 CLINICAL DATA:  Altered mental status.  Hypertension. EXAM: PORTABLE CHEST 1 VIEW COMPARISON:  None. FINDINGS: Shallow inspiration. Normal heart size and pulmonary vascularity. No focal  airspace disease or consolidation in the lungs. No blunting of costophrenic angles. No pneumothorax. Mediastinal contours appear intact. IMPRESSION: No active disease. Electronically Signed   By: Lucienne Capers M.D.   On: 06/09/2016 23:58     A: deep gcs 3 coma post arrest - likely brain dead Cardiac arrest due to GI bleed Acute resp failure due to above Circulatory shock due to above  P: terminal wean per wife discussion   .  Rest per NP/medical resident whose note is outlined above and that I agree with  The patient is critically ill with multiple organ systems failure and requires high complexity decision making for assessment and support, frequent evaluation and titration of therapies, application of advanced monitoring technologies and extensive interpretation of multiple databases.   Critical Care Time devoted to patient care services described in this note is  30  Minutes. This time reflects time of care of this signee Dr Brand Males. This critical care time does not reflect procedure time, or teaching time or supervisory time of PA/NP/Med student/Med Resident etc but could involve care discussion time    Dr. Brand Males, M.D., [facility].C.P Pulmonary and Critical Care Medicine Staff Physician [facility] Pager: [phone], If no answer or between  15:00h - 7:00h: call [phone]  2016-07-22 3:19 PM

## 2016-07-09 NOTE — Progress Notes (Signed)
PULMONARY / CRITICAL CARE MEDICINE   Name: [name] MRN: [id number] DOB: 01-13-1966    ADMISSION DATE:  06/19/2016 CONSULTATION DATE:  06/23/2016  REFERRING MD:  Dr. Kathrynn Humble   CHIEF COMPLAINT:  AMS  HISTORY OF PRESENT ILLNESS:   51yo male presents to ED from home after cousin found patient unresponsive in bathroom and called 911. No family present on arrival and patient had no ID. Glucose >700, Hemoglobin 6.6, patient unresponsive, intubated in ED for airway protection. Estimated 1.5L of bloody output from OG tube. While in the ED patient coded 3 times for a total of about 15 minutes total. Went into [facility] and was shock a total of 3 times. Went into [place], given 2 mg Magnesium. PCCM asked to admit.    SUBJECTIVE:  Remains unresponsive with no response to pain, no gag .  Neuro consult , dNR status , family discussion  CT with diffuse cerebral edema  Remains on pressor support   VITAL SIGNS: BP (!) 66/50 (BP Location: Right Arm)   Pulse 100   Temp 98.1 F (36.7 C)   Resp (!) 24   Wt 190 lb 4.1 oz (86.3 kg)   SpO2 100%   HEMODYNAMICS: CVP:  [3 mmHg-10 mmHg] 3 mmHg  VENTILATOR SETTINGS: Vent Mode: PRVC FiO2 (%):  [40 %] 40 % Set Rate:  [24 bmp] 24 bmp Vt Set:  [500 mL] 500 mL PEEP:  [5 cmH20] 5 cmH20 Plateau Pressure:  [14 cmH20-17 cmH20] 15 cmH20  INTAKE / OUTPUT: I/O last 3 completed shifts: In: 8276.6 [I.V.:4983.6; Other:700; NG/GT:90; IV Piggyback:2503] Out: 3065 [Urine:2515; Emesis/NG output:550]  PHYSICAL EXAMINATION: General:  Ill appearing male on vent  Neuro:  Unresponsive on vent , no gag  HEENT: ETT in place  Cardiovascular:  Irregular , no m/r/g Lungs: decreased BS in bases  Abdomen:  Distended , no BS  Musculoskeletal: Bilateral BKA  Skin:  Cool ,no rash   LABS:  BMET  Recent Labs Lab 06/23/16 1439 06/23/16 1614 07-19-2016 0400  NA 135 135 134*  K 3.3* 3.3* 2.7*  CL 91* 93* 92*  CO2 26 27 28   BUN 111* 110* 110*  CREATININE 3.78*  3.88* 4.68*  GLUCOSE 121* 121* 131*    Electrolytes  Recent Labs Lab 06/23/16 0324  06/23/16 1439 06/23/16 1614 Jul 19, 2016 0400  CALCIUM  --   < > 6.8* 6.8* 6.5*  MG 2.6*  --   --   --  1.7  PHOS 11.5*  --   --   --  4.2  < > = values in this interval not displayed.  CBC  Recent Labs Lab 06/23/16 0740 06/23/16 1400 07-19-16 0400  WBC 33.3* 34.1* 20.1*  HGB 8.8* 9.0* 8.2*  HCT 25.4* 24.4* 22.4*  PLT 376 300 169    Coag's  Recent Labs Lab 06/08/2016 2327  APTT 28  28  INR 1.59  1.64    Sepsis Markers  Recent Labs Lab 06/23/16 0740 06/23/16 1400 06/23/16 1700  LATICACIDVEN 9.0* 3.5* 2.7*    ABG  Recent Labs Lab 06/23/16 0640 06/23/16 1721 2016-07-19 0406  PHART 7.321* 7.449 7.482*  PCO2ART 29.2* 42.4 39.5  PO2ART 102 77.0* 79.9*    Liver Enzymes  Recent Labs Lab 06/29/2016 2327  AST 36  ALT 22  ALKPHOS 73  BILITOT 0.5  ALBUMIN 2.9*    Cardiac Enzymes  Recent Labs Lab 06/23/16 0550 06/23/16 1216 06/23/16 1614  TROPONINI 3.43* 64.29* >65.00*    Glucose  Recent Labs Lab  06/23/16 1938 06/23/16 2039 06/23/16 2149 06/23/16 2252 07-23-2016 0402 23-Jul-2016 0817  GLUCAP 227* 221* 184* 158* 120* 188*    Imaging Ct Head Wo Contrast  Addendum Date: 06/23/2016   ADDENDUM REPORT: 06/23/2016 18:54 ADDENDUM: These results were called by telephone at the time of interpretation on 06/23/2016 at 6:54 pm to Dr. Boone Master , who verbally acknowledged these results. Electronically Signed   By: Kristine Garbe M.D.   On: 06/23/2016 18:54   Result Date: 06/23/2016 CLINICAL DATA:  51 y/o  M; found unresponsive, anisocoria. EXAM: CT HEAD WITHOUT CONTRAST TECHNIQUE: Contiguous axial images were obtained from the base of the skull through the vertex without intravenous contrast. COMPARISON:  06/23/2016 CT of the head. FINDINGS: Brain: Interval development of multiple areas of cortical hypoattenuation confluent in bilateral occipital and right  temporal lobes with additional lesions in the basal ganglia. Interval development of diffuse sulcal effacement, ventricle effacement, and basilar cistern effacement compatible with cerebral edema. No definite intracranial hemorrhage. Vascular: No unexpected calcification. Skull: Normal. Negative for fracture or focal lesion. Sinuses/Orbits: Left maxillary sinus mucous retention cyst. Fluid within the nasopharynx and right maxillary sinus fluid level is likely due to intubation. Mild stable bilateral proptosis peer Other: None. IMPRESSION: Interval development of diffuse cerebral edema and multiple cortical lucencies probably representing hypoxic brain injury or severe cerebritis. No downward herniation at this time. No acute intracranial hemorrhage identified. Electronically Signed: By: Kristine Garbe M.D. On: 06/23/2016 18:40   Dg Chest Port 1 View  Result Date: 07-23-2016 CLINICAL DATA:  Acute respiratory failure EXAM: PORTABLE CHEST 1 VIEW COMPARISON:  Chest x-ray and CT scan from yesterday FINDINGS: The distal ET tube terminates 3 cm below the thoracic inlet and 10 cm above the carina. No pneumothorax. Stable right central line. Increasing right perihilar infiltrate. No other interval changes. IMPRESSION: 1. The ETT is relatively high but within normal limits, 3 cm below the thoracic inlet. 2. Increasing infiltrate in the right perihilar region. Electronically Signed   By: Dorise Bullion III M.D   On: Jul 23, 2016 09:03     STUDIES:  CT Head 3/16 >diffuse cerebral edema , multiple cortical lucencies  CT Chest 3/16 >ATX in bases  CT A/P 3/16 >colonic dilation, wall thickenign in sigmoid ,   CULTURES: Urine 3/16 >NEG  Blood 3/16 > Sputum 3/16 >  ANTIBIOTICS: Zosyn 3/16 > Vancomycin 3/16 >  SIGNIFICANT EVENTS: 3/15 > Presents to ED unresponsive   LINES/TUBES: ETT 3/16 >> Right Subclavian CVC 3/16 >> Left Femoral Aline 3/16 >>   DISCUSSION: Unknown male presents to ED via EMS  after cousin called 911 and stated he found the patient unresponsive in the bathroom. No family present on patient arrival. Patient had no Identification. Glucose >700, Hemoglobin 6.6, patient unresponsive, intubated in ED for airway protection. Estimated 1.5L of bloody output from OG tube.   ASSESSMENT / PLAN:  PULMONARY A: Vent Dependent secondary to Hemorrhagic Shock  P:   Vent Support  Wean as tolerated  Tr CXR in am  Cont abx support   CARDIOVASCULAR A:  Cardiac Arrest secondary to Hemorrhagic Shock  -S/P 3 RBC and 7L NS  P:  Cardiac Monitoring  Trend Troponin   Titrate pressors for MAP >65  RENAL A:   Hyponatremia  AKI (?CKD) Lactic Acidosis  Metabolic Acidosis  Hypokalemia  P:   Trend BMP Replace Electrolytes as needed  K replaced  GASTROINTESTINAL A:   Hemoccult Positive  P:   NPO PPI  Appreciate GI  consult  Cont PPI Twice daily   Octreotide drip   HEMATOLOGIC A:   Anemia  S/P 3 Units RBC  P:  Transfuse for Hbg <7 Hold anticoagulation   INFECTIOUS A:   Hemorrhagic Shock +/-Sepsis  P:   Trend Fever and WBC curve Follow culture data Continue Vancomycin and Zosyn   ENDOCRINE A:   Hyperglycemia HHNS vs DKA -BetaH 1.8, Ketones Neg  P:   Insulin GTT CBG q1h   NEUROLOGIC A:   Acute Encephalopathy  Diffuse Cerebral edema on CT head  P:   Neuro following  Fentanyl and Versed PRN  RASS goal: 0    FAMILY  - Updates: No family present   - Inter-disciplinary family meet or Palliative Care meeting due by: 3/23    Tammy Parrett NP-C  [facility]  [phone]   2016-07-03

## 2016-07-09 NOTE — Progress Notes (Signed)
[facility] Progress Note  [name] 51 y.o. 04/27/1965  CC:  GI bleed   Subjective: Patient remains critically ill and intubated. Discussed with the nursing staff. Noted small amount of bright blood as well as blood clots per rectum.  ROS : Not able to obtain. Patient remains intubated   Objective: Vital signs in last 24 hours: Vitals:   07/12/2016 0900 2016-07-12 0907  BP:    Pulse: 100 100  Resp: (!) 24 (!) 24  Temp: 98.1 F (36.7 C)     Physical Exam:  General:  Intubated         Lungs:   Coarse breath sounds. Anterior exam only.   Heart:  Regular rate and rhythm, S1, S2 normal  Abdomen:   Soft, non-tender, bowel sounds active all four quadrants,  no masses,   Extremities: Bilateral BKA        Lab Results:  Recent Labs  06/23/16 0324  06/23/16 1614 Jul 12, 2016 0400  NA  --   < > 135 134*  K  --   < > 3.3* 2.7*  CL  --   < > 93* 92*  CO2  --   < > 27 28  GLUCOSE  --   < > 121* 131*  BUN  --   < > 110* 110*  CREATININE  --   < > 3.88* 4.68*  CALCIUM  --   < > 6.8* 6.5*  MG 2.6*  --   --  1.7  PHOS 11.5*  --   --  4.2  < > = values in this interval not displayed.  Recent Labs  06/13/2016 2327  AST 36  ALT 22  ALKPHOS 73  BILITOT 0.5  PROT 5.2*  ALBUMIN 2.9*    Recent Labs  07/06/2016 2327  06/23/16 1400 Jul 12, 2016 0400  WBC 29.3*  < > 34.1* 20.1*  NEUTROABS 26.3*  --   --   --   HGB 6.6*  < > 9.0* 8.2*  HCT 21.8*  < > 24.4* 22.4*  MCV 98.2  < > 79.7 81.5  PLT 441*  459*  < > 300 169  < > = values in this interval not displayed.  Recent Labs  06/10/2016 2327  LABPROT 19.1*  19.6*  INR 1.59  1.64      Assessment/Plan: - GI bleed - Cardiac arrest requiring resuscitation. - Diffuse cerebral edema. Possible anoxic brain injury. - Elevated troponins. > 65  - Acute kidney injury - Respiratory failure - Hypotension  Recommendations ---------------------------- - Patient's hemoglobin is relatively stable. Patient remains  critically ill with diffuse cerebral edema, acute kidney injury, respiratory failure, hypotension and elevated troponins >65.  - I do not think endoscopic intervention at this point would add any survival benefits. - May consider endoscopic evaluation with EGD  if shows evidence of active upper GI bleed - Patient remains critically ill and prognosis is poor. - GI will follow  Otis Brace MD, [place] 2016-07-12, 10:02 AM  Pager [phone]  If no answer or after 5 PM call [phone]

## 2016-07-09 NOTE — Progress Notes (Signed)
[facility] Progress Note Patient Name: [name] DOB: [birth year] MRN: [id number]   Date of Service  07/08/16  HPI/Events of Note  Cr rising, making urine  eICU Interventions  Hypokalemia -repleted      Intervention Category Intermediate Interventions: Electrolyte abnormality - evaluation and management  Afreen Siebels V. July 08, 2016, 4:54 AM

## 2016-07-09 DEATH — deceased
